# Patient Record
Sex: Female | Born: 1940
Health system: Southern US, Community
[De-identification: ages and names within clinical notes are randomized; demographics above are authoritative.]

## PROBLEM LIST (undated history)

## (undated) DIAGNOSIS — F419 Anxiety disorder, unspecified: Secondary | ICD-10-CM

## (undated) DIAGNOSIS — J45909 Unspecified asthma, uncomplicated: Secondary | ICD-10-CM

## (undated) DIAGNOSIS — R42 Dizziness and giddiness: Secondary | ICD-10-CM

## (undated) DIAGNOSIS — M1990 Unspecified osteoarthritis, unspecified site: Secondary | ICD-10-CM

## (undated) DIAGNOSIS — N281 Cyst of kidney, acquired: Secondary | ICD-10-CM

## (undated) DIAGNOSIS — Z9889 Other specified postprocedural states: Secondary | ICD-10-CM

## (undated) DIAGNOSIS — J189 Pneumonia, unspecified organism: Secondary | ICD-10-CM

## (undated) DIAGNOSIS — IMO0001 Reserved for inherently not codable concepts without codable children: Secondary | ICD-10-CM

## (undated) DIAGNOSIS — R112 Nausea with vomiting, unspecified: Secondary | ICD-10-CM

## (undated) DIAGNOSIS — B9681 Helicobacter pylori [H. pylori] as the cause of diseases classified elsewhere: Secondary | ICD-10-CM

## (undated) DIAGNOSIS — Z87442 Personal history of urinary calculi: Secondary | ICD-10-CM

## (undated) DIAGNOSIS — K219 Gastro-esophageal reflux disease without esophagitis: Secondary | ICD-10-CM

## (undated) DIAGNOSIS — R519 Headache, unspecified: Secondary | ICD-10-CM

## (undated) DIAGNOSIS — F32A Depression, unspecified: Secondary | ICD-10-CM

## (undated) DIAGNOSIS — K760 Fatty (change of) liver, not elsewhere classified: Secondary | ICD-10-CM

## (undated) DIAGNOSIS — L309 Dermatitis, unspecified: Secondary | ICD-10-CM

## (undated) DIAGNOSIS — K589 Irritable bowel syndrome without diarrhea: Secondary | ICD-10-CM

## (undated) DIAGNOSIS — D649 Anemia, unspecified: Secondary | ICD-10-CM

## (undated) DIAGNOSIS — M542 Cervicalgia: Secondary | ICD-10-CM

## (undated) DIAGNOSIS — R51 Headache: Secondary | ICD-10-CM

## (undated) DIAGNOSIS — Z531 Procedure and treatment not carried out because of patient's decision for reasons of belief and group pressure: Secondary | ICD-10-CM

## (undated) DIAGNOSIS — R7303 Prediabetes: Secondary | ICD-10-CM

## (undated) DIAGNOSIS — F329 Major depressive disorder, single episode, unspecified: Secondary | ICD-10-CM

## (undated) DIAGNOSIS — M797 Fibromyalgia: Secondary | ICD-10-CM

## (undated) DIAGNOSIS — E785 Hyperlipidemia, unspecified: Secondary | ICD-10-CM

## (undated) DIAGNOSIS — J385 Laryngeal spasm: Secondary | ICD-10-CM

## (undated) DIAGNOSIS — T8859XA Other complications of anesthesia, initial encounter: Secondary | ICD-10-CM

## (undated) DIAGNOSIS — G4733 Obstructive sleep apnea (adult) (pediatric): Secondary | ICD-10-CM

## (undated) DIAGNOSIS — K279 Peptic ulcer, site unspecified, unspecified as acute or chronic, without hemorrhage or perforation: Secondary | ICD-10-CM

## (undated) HISTORY — DX: Helicobacter pylori (H. pylori) as the cause of diseases classified elsewhere: B96.81

## (undated) HISTORY — DX: Reserved for inherently not codable concepts without codable children: IMO0001

## (undated) HISTORY — DX: Dermatitis, unspecified: L30.9

## (undated) HISTORY — DX: Unspecified asthma, uncomplicated: J45.909

## (undated) HISTORY — DX: Helicobacter pylori (H. pylori) as the cause of diseases classified elsewhere: K27.9

## (undated) HISTORY — DX: Gastro-esophageal reflux disease without esophagitis: K21.9

## (undated) HISTORY — PX: INNER EAR SURGERY: SHX679

## (undated) HISTORY — DX: Fibromyalgia: M79.7

## (undated) HISTORY — DX: Hyperlipidemia, unspecified: E78.5

## (undated) HISTORY — PX: TONSILLECTOMY: SUR1361

## (undated) HISTORY — DX: Anxiety disorder, unspecified: F41.9

## (undated) HISTORY — DX: Unspecified osteoarthritis, unspecified site: M19.90

## (undated) HISTORY — DX: Depression, unspecified: F32.A

## (undated) HISTORY — DX: Obstructive sleep apnea (adult) (pediatric): G47.33

## (undated) HISTORY — DX: Procedure and treatment not carried out because of patient's decision for reasons of belief and group pressure: Z53.1

## (undated) HISTORY — DX: Irritable bowel syndrome, unspecified: K58.9

## (undated) HISTORY — PX: KNEE SURGERY: SHX244

## (undated) HISTORY — DX: Major depressive disorder, single episode, unspecified: F32.9

## (undated) HISTORY — PX: APPENDECTOMY: SHX54

## (undated) HISTORY — PX: BILATERAL SALPINGOOPHORECTOMY: SHX1223

---

## 1968-02-17 HISTORY — PX: TUBAL LIGATION: SHX77

## 1968-02-17 HISTORY — PX: ABDOMINAL HYSTERECTOMY: SHX81

## 1968-02-17 HISTORY — PX: VAGINAL HYSTERECTOMY: SUR661

## 1982-02-16 HISTORY — PX: TYMPANOSTOMY TUBE PLACEMENT: SHX32

## 2000-02-17 HISTORY — PX: CARDIAC CATHETERIZATION: SHX172

## 2000-10-21 ENCOUNTER — Encounter: Payer: Self-pay | Admitting: Family Medicine

## 2000-10-21 ENCOUNTER — Ambulatory Visit (HOSPITAL_COMMUNITY): Admission: RE | Admit: 2000-10-21 | Discharge: 2000-10-21 | Payer: Self-pay | Admitting: Internal Medicine

## 2000-10-21 ENCOUNTER — Ambulatory Visit (HOSPITAL_COMMUNITY): Admission: RE | Admit: 2000-10-21 | Discharge: 2000-10-21 | Payer: Self-pay | Admitting: Family Medicine

## 2000-12-09 ENCOUNTER — Ambulatory Visit (HOSPITAL_COMMUNITY): Admission: RE | Admit: 2000-12-09 | Discharge: 2000-12-10 | Payer: Self-pay | Admitting: Cardiology

## 2001-08-02 ENCOUNTER — Ambulatory Visit (HOSPITAL_BASED_OUTPATIENT_CLINIC_OR_DEPARTMENT_OTHER): Admission: RE | Admit: 2001-08-02 | Discharge: 2001-08-02 | Payer: Self-pay | Admitting: Family Medicine

## 2002-01-03 ENCOUNTER — Ambulatory Visit (HOSPITAL_COMMUNITY): Admission: RE | Admit: 2002-01-03 | Discharge: 2002-01-03 | Payer: Self-pay | Admitting: Family Medicine

## 2002-01-03 ENCOUNTER — Encounter: Payer: Self-pay | Admitting: Family Medicine

## 2003-02-17 HISTORY — PX: FOOT SURGERY: SHX648

## 2006-11-04 ENCOUNTER — Ambulatory Visit (HOSPITAL_COMMUNITY): Admission: RE | Admit: 2006-11-04 | Discharge: 2006-11-04 | Payer: Self-pay | Admitting: Family Medicine

## 2006-11-27 ENCOUNTER — Emergency Department (HOSPITAL_COMMUNITY): Admission: EM | Admit: 2006-11-27 | Discharge: 2006-11-27 | Payer: Self-pay | Admitting: Emergency Medicine

## 2007-01-17 HISTORY — PX: COLONOSCOPY: SHX174

## 2007-01-17 HISTORY — PX: ESOPHAGOGASTRODUODENOSCOPY: SHX1529

## 2007-01-28 ENCOUNTER — Ambulatory Visit: Payer: Self-pay | Admitting: Gastroenterology

## 2007-02-14 ENCOUNTER — Encounter: Payer: Self-pay | Admitting: Gastroenterology

## 2007-02-14 ENCOUNTER — Ambulatory Visit: Payer: Self-pay | Admitting: Gastroenterology

## 2007-02-14 DIAGNOSIS — K2100 Gastro-esophageal reflux disease with esophagitis, without bleeding: Secondary | ICD-10-CM | POA: Insufficient documentation

## 2007-02-14 DIAGNOSIS — K2970 Gastritis, unspecified, without bleeding: Secondary | ICD-10-CM | POA: Insufficient documentation

## 2007-02-14 DIAGNOSIS — K2990 Gastroduodenitis, unspecified, without bleeding: Secondary | ICD-10-CM

## 2007-02-15 DIAGNOSIS — M19049 Primary osteoarthritis, unspecified hand: Secondary | ICD-10-CM | POA: Insufficient documentation

## 2007-02-15 DIAGNOSIS — K589 Irritable bowel syndrome without diarrhea: Secondary | ICD-10-CM | POA: Insufficient documentation

## 2007-02-15 DIAGNOSIS — G4730 Sleep apnea, unspecified: Secondary | ICD-10-CM | POA: Insufficient documentation

## 2007-02-15 DIAGNOSIS — K219 Gastro-esophageal reflux disease without esophagitis: Secondary | ICD-10-CM | POA: Insufficient documentation

## 2007-02-15 DIAGNOSIS — E7800 Pure hypercholesterolemia, unspecified: Secondary | ICD-10-CM | POA: Insufficient documentation

## 2007-02-15 DIAGNOSIS — M19041 Primary osteoarthritis, right hand: Secondary | ICD-10-CM | POA: Insufficient documentation

## 2007-02-15 DIAGNOSIS — M19042 Primary osteoarthritis, left hand: Secondary | ICD-10-CM

## 2007-03-18 ENCOUNTER — Ambulatory Visit: Payer: Self-pay | Admitting: Gastroenterology

## 2007-07-12 ENCOUNTER — Ambulatory Visit (HOSPITAL_COMMUNITY): Admission: RE | Admit: 2007-07-12 | Discharge: 2007-07-12 | Payer: Self-pay | Admitting: Family Medicine

## 2007-08-04 ENCOUNTER — Ambulatory Visit (HOSPITAL_COMMUNITY): Admission: RE | Admit: 2007-08-04 | Discharge: 2007-08-04 | Payer: Self-pay | Admitting: Family Medicine

## 2007-08-05 ENCOUNTER — Ambulatory Visit (HOSPITAL_COMMUNITY): Admission: RE | Admit: 2007-08-05 | Discharge: 2007-08-05 | Payer: Self-pay | Admitting: Family Medicine

## 2007-09-05 ENCOUNTER — Ambulatory Visit (HOSPITAL_COMMUNITY): Admission: RE | Admit: 2007-09-05 | Discharge: 2007-09-05 | Payer: Self-pay | Admitting: Family Medicine

## 2007-09-05 ENCOUNTER — Encounter: Payer: Self-pay | Admitting: Family Medicine

## 2007-09-05 ENCOUNTER — Ambulatory Visit: Payer: Self-pay | Admitting: Cardiology

## 2008-10-18 ENCOUNTER — Ambulatory Visit (HOSPITAL_COMMUNITY): Admission: RE | Admit: 2008-10-18 | Discharge: 2008-10-18 | Payer: Self-pay | Admitting: Family Medicine

## 2008-10-26 ENCOUNTER — Encounter (INDEPENDENT_AMBULATORY_CARE_PROVIDER_SITE_OTHER): Payer: Self-pay | Admitting: *Deleted

## 2008-10-26 LAB — CONVERTED CEMR LAB
Cholesterol: 267 mg/dL
Cholesterol: 267 mg/dL
Glucose, Bld: 102 mg/dL
HDL: 51 mg/dL
HDL: 51 mg/dL
LDL Cholesterol: 190 mg/dL
LDL Cholesterol: 190 mg/dL
TSH: 3.082 microintl units/mL
TSH: 3.082 microintl units/mL
Triglycerides: 130 mg/dL
Triglycerides: 130 mg/dL

## 2008-11-21 ENCOUNTER — Emergency Department (HOSPITAL_COMMUNITY): Admission: EM | Admit: 2008-11-21 | Discharge: 2008-11-21 | Payer: Self-pay | Admitting: Emergency Medicine

## 2009-02-01 ENCOUNTER — Encounter (INDEPENDENT_AMBULATORY_CARE_PROVIDER_SITE_OTHER): Payer: Self-pay | Admitting: *Deleted

## 2009-02-01 ENCOUNTER — Emergency Department (HOSPITAL_COMMUNITY): Admission: EM | Admit: 2009-02-01 | Discharge: 2009-02-01 | Payer: Self-pay | Admitting: Emergency Medicine

## 2009-02-01 LAB — CONVERTED CEMR LAB
BUN: 14 mg/dL
CO2: 28 meq/L
Calcium: 9.2 mg/dL
Chloride: 103 meq/L
Creatinine, Ser: 0.7 mg/dL
GFR calc non Af Amer: >60 mL/min
Glomerular Filtration Rate, Af Am: >60 mL/min/{1.73_m2}
Glucose, Bld: 101 mg/dL
HCT: 39.5 %
Hemoglobin: 13.1 g/dL
MCV: 94.9 fL
Platelets: 243 10*3/uL
Potassium: 3.5 meq/L
Sodium: 137 meq/L
Total CK: <1 units/L
Troponin I: <0.05 ng/mL
WBC: 5.8 10*3/uL

## 2009-02-11 ENCOUNTER — Encounter: Payer: Self-pay | Admitting: *Deleted

## 2009-02-11 ENCOUNTER — Ambulatory Visit: Payer: Self-pay | Admitting: Cardiology

## 2009-02-11 DIAGNOSIS — R079 Chest pain, unspecified: Secondary | ICD-10-CM | POA: Insufficient documentation

## 2009-02-16 HISTORY — PX: SHOULDER SURGERY: SHX246

## 2009-03-04 ENCOUNTER — Encounter (INDEPENDENT_AMBULATORY_CARE_PROVIDER_SITE_OTHER): Payer: Self-pay | Admitting: *Deleted

## 2009-03-06 ENCOUNTER — Ambulatory Visit: Payer: Self-pay | Admitting: Cardiology

## 2009-03-06 ENCOUNTER — Ambulatory Visit (HOSPITAL_COMMUNITY): Admission: RE | Admit: 2009-03-06 | Discharge: 2009-03-06 | Payer: Self-pay | Admitting: Cardiology

## 2009-03-08 ENCOUNTER — Ambulatory Visit (HOSPITAL_COMMUNITY)
Admission: RE | Admit: 2009-03-08 | Discharge: 2009-03-08 | Payer: Self-pay | Source: Home / Self Care | Admitting: Family Medicine

## 2009-03-19 ENCOUNTER — Ambulatory Visit (HOSPITAL_COMMUNITY): Admission: RE | Admit: 2009-03-19 | Discharge: 2009-03-19 | Payer: Self-pay | Admitting: Family Medicine

## 2009-03-21 ENCOUNTER — Encounter: Payer: Self-pay | Admitting: Cardiology

## 2009-03-29 ENCOUNTER — Ambulatory Visit: Payer: Self-pay | Admitting: Cardiology

## 2009-04-24 ENCOUNTER — Encounter: Admission: RE | Admit: 2009-04-24 | Discharge: 2009-04-24 | Payer: Self-pay | Admitting: Orthopaedic Surgery

## 2009-12-03 ENCOUNTER — Encounter: Admission: RE | Admit: 2009-12-03 | Discharge: 2009-12-03 | Payer: Self-pay | Admitting: Orthopaedic Surgery

## 2009-12-13 ENCOUNTER — Encounter: Admission: RE | Admit: 2009-12-13 | Discharge: 2009-12-13 | Payer: Self-pay | Admitting: Neurosurgery

## 2009-12-20 ENCOUNTER — Ambulatory Visit: Admission: RE | Admit: 2009-12-20 | Discharge: 2009-12-20 | Payer: Self-pay | Admitting: Family Medicine

## 2010-03-09 ENCOUNTER — Encounter: Payer: Self-pay | Admitting: Family Medicine

## 2010-03-09 ENCOUNTER — Encounter: Payer: Self-pay | Admitting: Neurosurgery

## 2010-03-10 ENCOUNTER — Encounter: Payer: Self-pay | Admitting: Orthopaedic Surgery

## 2010-03-10 ENCOUNTER — Encounter: Payer: Self-pay | Admitting: Family Medicine

## 2010-03-13 ENCOUNTER — Other Ambulatory Visit: Payer: Self-pay | Admitting: Obstetrics and Gynecology

## 2010-03-20 NOTE — Assessment & Plan Note (Signed)
Summary: per Dr.Scott Luking for chest pain and pressure/tg  Medications Added DIAZEPAM 5 MG TABS (DIAZEPAM) take one tablet as needed PRILOSEC 20 MG CPDR (OMEPRAZOLE) two times a day as needed      Allergies Added: ! CODEINE ! CIPRO  Visit Type:  Initial Consult Primary Provider:  Dr. Lilyan Punt   History of Present Illness: 70 year old woman with past medical history outlined below, referred following a recent episode of chest and epigastric discomfort. She has a remote history of chest pain back in 2002, evaluated with both noninvasive stress testing and a subsequent cardiac catheterization, with findings of normal coronary arteries ultimately ([facility]).  Ruth [name] indicates that she awoke from sleep at around 6 AM last Thursday with a feeling of "fullness and swelling" in her epigastric area and chest. She subsequently felt nauseated and somewhat agitated, but decided to go about her usual routine. She was sweeping her porch later when her symptoms intensified, mainly nausea and diaphoresis at that point, requiring her to lie down. The symptoms waxed and waned for the rest of the day into the following morning, and she was eventually seen in the emergency department. Symptoms resolved spontaneously without any specific intervention by her report. Cardiac markers at that time were normal, as was an electrocardiogram. She is scheduled for a gallbladder ultrasound tomorrow.  She indicates that there have been no subsequent symptoms. She denies any typical exertional chest pain or breathlessness beyond NYHA class II. No palpitations or syncope. No reproduction of symptoms following meals. Stable reflux symptoms, with no similarity.  She has had no followup ischemic testing over the last 8 years.  Preventive Screening-Counseling & Management  Alcohol-Tobacco     Smoking Status: never  Current Medications (verified): 1)  Diazepam 5 Mg Tabs (Diazepam) .... Take One Tablet As  Needed 2)  Prilosec 20 Mg Cpdr (Omeprazole) .... Two Times A Day As Needed  Allergies (verified): 1)  ! Codeine 2)  ! Cipro  Past History:  Family History: Last updated: 02-19-09 Father: deceased MI and lung CA Mother: living  CVA Siblings: sister living lung CA                sister living HTN                sister deceased congental heart defect  Social History: Last updated: 02-19-09 Retired   Married  Tobacco Use - No Alcohol Use - yes (occasional)  Past Medical History: Anxiety G E R D (EGD/colon 1/09) Hyperlipidemia Obstructive sleep apnea Herbal bowel syndrome Arthritis Depression Normal cardiac catheterization, 2002 ([facility]) Fibromyalgia  Past Surgical History: Hysterectomy Appendectomy Knee Surgeries Bilateral Oophorectomy Tubal Ligation Left ear surgery  Family History: Father: deceased MI and lung CA Mother: living  CVA Siblings: sister living lung CA                sister living HTN                sister deceased congental heart defect  Social History: Retired   Married  Tobacco Use - No Alcohol Use - yes (occasional) Smoking Status:  never  Review of Systems  The patient denies anorexia, fever, weight loss, vision loss, hoarseness, syncope, peripheral edema, prolonged cough, headaches, hemoptysis, abdominal pain, melena, and hematochezia.         Otherwise reviewed and negative.  Vital Signs:  Patient profile:   70 year old female Height:      65 inches Weight:  211 pounds BMI:     35.24 O2 Sat:      98 % on Room air Pulse rate:   67 / minute BP sitting:   123 / 75  (left arm)  Vitals Entered By: Teressa Lower RN (February 11, 2009 1:14 PM)  O2 Flow:  Room air  Echocardiogram  Procedure date:  09/05/2007  Findings:      SUMMARY   -  Overall left ventricular systolic function was normal. There were         no left ventricular regional wall motion abnormalities.   -  The left atrium was mildly dilated.   Physical  Exam  Additional Exam:  Overweight woman in no acute distress. HEENT: Conjunctiva and lids normal, oropharynx clear. Neck: Supple, no carotid bruits, no thyromegaly. Lungs: Clear to auscultation, nonlabored. Cardiac: Regular rate and rhythm, no S3, no pathologic systolic murmur. Abdomen: Soft, nontender, bowel sounds present, no bruits. Activities: No pitting edema, distal pulses 2+. Skin: Warm and dry. Musculoskeletal: No kyphosis. Neuropsychiatric: Alert and oriented x3, affect appropriate.   CXR  Procedure date:  02/01/2009  Findings:       Findings: There is subsegmental atelectasis or scarring   peripherally at the left lung base.  The lungs appear otherwise   clear.  Cardiac and mediastinal contours appear unremarkable.    No additional significant findings are identified.    IMPRESSION:    1.  Linear subsegmental atelectasis or scarring peripherally at the   left lung base.  Otherwise negative.  Cardiac Cath  Procedure date:  12/09/2000  Findings:      HEMODYNAMIC DATA: 1. Central aortic pressure was 116/59. 2. Left ventricular pressure 116/18.  ANGIOGRAPHIC DATA:  Left main coronary artery was angiographically normal and bifurcated into an LAD and left circumflex system.  The LAD gave rise to a proximal bifurcating diagonal vessel.  The LAD and its branches were angiographically normal.  The circumflex coronary artery gave rise to two circumflex marginal vessels which were angiographically normal.  The right coronary artery was angiographically normal and gave rise to a PDA and ended in a posterolateral vessel.  BIPLANE CINE LEFT VENTRICULOGRAPHY revealed normal LV function without focal segmental wall motion abnormalities.  DISTAL AORTOGRAPHY did not demonstrate any aortoiliac disease.  At the completion of the procedure, Perclose closure device was attempted; however, this resulted in a suboptimal seal.  Consequently, manual pressure was applied  for optimal hemostasis.  EKG  Procedure date:  02/11/2009  Findings:      Normal sinus rhythm at 62 beats per minute.  Impression & Recommendations:  Problem # 1:  CHEST PAIN (ICD-786.50)  Atypical features, without recurrence. Associated symptoms included nausea and diaphoresis, perhaps with a transient exertional component. She does have reflux on a regular basis, although states that these symptoms are different. She also reports a prior history of "esophageal spasm" also different in character. Cardiac risk factors include hyperlipidemia being managed without medications, obstructive sleep apnea, and family history of cardiovascular disease. She has not undergone any interval ischemic testing since 2002. We discussed the matter and reviewed options for further diagnosis. She will be referred for an exercise echocardiogram (dobutamine study if unable to achieve adequate heart rate). Office followup will be arranged with the next few weeks.  Orders: Treadmill (Treadmill)  Problem # 2:  HYPERCHOLESTEROLEMIA (ICD-272.0)  Presently not on any specific medical therapy. This has been discussed with her in the past based on records.  Problem # 3:  SLEEP  APNEA (ICD-780.57)  Followed by Dr. Gerda Diss.  Patient Instructions: 1)  Your physician recommends that you schedule a follow-up appointment in: 2 to 3 weeks 2)  Your physician recommends that you continue on your current medications as directed. Please refer to the Current Medication list given to you today. 3)  Your physician has requested that you have an exercise echocardiogram.  For further information please visit [email or web address]  Please also follow instruction sheet, as given.    CXR  Procedure date:  02/01/2009  Findings:       Findings: There is subsegmental atelectasis or scarring   peripherally at the left lung base.  The lungs appear otherwise   clear.  Cardiac and mediastinal contours appear unremarkable.    No  additional significant findings are identified.    IMPRESSION:    1.  Linear subsegmental atelectasis or scarring peripherally at the   left lung base.  Otherwise negative.  Cardiac Cath  Procedure date:  12/09/2000  Findings:      HEMODYNAMIC DATA: 1. Central aortic pressure was 116/59. 2. Left ventricular pressure 116/18.  ANGIOGRAPHIC DATA:  Left main coronary artery was angiographically normal and bifurcated into an LAD and left circumflex system.  The LAD gave rise to a proximal bifurcating diagonal vessel.  The LAD and its branches were angiographically normal.  The circumflex coronary artery gave rise to two circumflex marginal vessels which were angiographically normal.  The right coronary artery was angiographically normal and gave rise to a PDA and ended in a posterolateral vessel.  BIPLANE CINE LEFT VENTRICULOGRAPHY revealed normal LV function without focal segmental wall motion abnormalities.  DISTAL AORTOGRAPHY did not demonstrate any aortoiliac disease.  At the completion of the procedure, Perclose closure device was attempted; however, this resulted in a suboptimal seal.  Consequently, manual pressure was applied for optimal hemostasis.  EKG  Procedure date:  02/11/2009  Findings:      Normal sinus rhythm at 62 beats per minute.  Appended Document: Orders Update- Stress Echo    Clinical Lists Changes  Orders: Added new Referral order of Stress Echo (Stress Echo) - Signed

## 2010-03-20 NOTE — Assessment & Plan Note (Signed)
Summary: ROV RESCEDULED PATIENT CAR PROBLEMS/SN      Allergies Added:   Visit Type:  Follow-up Primary Provider:  Dr. Lilyan Punt   History of Present Illness: 70 year old woman presents for a followup visit. She was referred for a followup ischemic evaluation due to chest pain. Exercise echocardiography was normal as detailed below. We reviewed this study today. She states that her subsequent gallbladder evaluation was reassuring. She denies any recurrent chest discomfort, and generally feels well.   Current Medications (verified): 1)  Diazepam 5 Mg Tabs (Diazepam) .... Take One Tablet As Needed 2)  Prilosec 20 Mg Cpdr (Omeprazole) .... Two Times A Day As Needed  Allergies (verified): 1)  ! Codeine 2)  ! Cipro  Past History:  Past Medical History: Last updated: 02/11/2009 Anxiety G E R D (EGD/colon 1/09) Hyperlipidemia Obstructive sleep apnea Herbal bowel syndrome Arthritis Depression Normal cardiac catheterization, 2002 ([facility]) Fibromyalgia  Social History: Last updated: 02/11/2009 Retired   Married  Tobacco Use - No Alcohol Use - yes (occasional)   Review of Systems  The patient denies anorexia, fever, weight loss, chest pain, syncope, dyspnea on exertion, peripheral edema, melena, and hematochezia.         Otherwise reviewed and negative.  Vital Signs:  Patient profile:   70 year old female Weight:      215 pounds Temp:     97.8 degrees F Pulse rate:   79 / minute BP sitting:   124 / 70  (right arm)  Vitals Entered By: Dreama Saa, CNA (March 29, 2009 10:57 AM)  Physical Exam  Additional Exam:  Overweight woman in no acute distress. HEENT: Conjunctiva and lids normal, oropharynx clear. Neck: Supple, no carotid bruits, no thyromegaly. Lungs: Clear to auscultation, nonlabored. Cardiac: Regular rate and rhythm, no S3, no pathologic systolic murmur. Musculoskeletal: No kyphosis. Neuropsychiatric: Alert and oriented x3, affect  appropriate.   Stress Echocardiogram  Procedure date:  03/06/2009  Findings:      CONCLUSION:   1. Negative exercise treadmill test for ischemia by standard criteria       at a maximum workload of 7 METS.  There was a hypertensive response       with exercise, no chest pain, and no significant arrhythmias.   2. No inducible wall motion abnormalities by echocardiography to       indicate ischemia.   Impression & Recommendations:  Problem # 1:  CHEST PAIN (ICD-786.50)  No significant recurrence, and follow-up reassuring stress echocardiogram demonstrating no diagnostic ST segment changes or inducible wall motion abnormalities. I reassured Ms. Freas. No further cardiac testing is anticipated at this particular time. Suggest basic risk factor modification strategies. She will follow up with her primary care provider, and we can see her back as needed.  Patient Instructions: 1)  Your physician recommends that you schedule a follow-up appointment in: as needed   2)  Your physician recommends that you continue on your current medications as directed. Please refer to the Current Medication list given to you today.

## 2010-03-20 NOTE — Procedures (Signed)
Summary: Gastroenterology colon  Gastroenterology colon   Imported By: Lowry Ram CMA 02/25/2007 14:15:31  _____________________________________________________________________  External Attachment:    Type:   Image     Comment:   External Document

## 2010-03-20 NOTE — Letter (Signed)
Summary: [place] Results Engineer, agricultural at [facility]  [address], [place] [address]   Phone: [phone]  Fax: [phone]      March 21, 2009 MRN: [id number]   [facility] [address] [place], [place]  [address]   Dear Ms. Jean Rosenthal,  Your test ordered by Selena Batten has been reviewed by your physician (or physician assistant) and was found to be normal or stable. Your physician (or physician assistant) felt no changes were needed at this time.  __X__ Echocardiogram  ____ Cardiac Stress Test  ____ Lab Work  ____ Peripheral vascular study of arms, legs or neck  ____ CT scan or X-ray  ____ Lung or Breathing test  ____ Other: Please continue on current medical treatment.   Thank you.   Nona Dell, MD, F.A.C.C

## 2010-03-20 NOTE — Procedures (Signed)
Summary: Gastroenterology  Gastroenterology   Imported By: Lowry Ram CMA 02/25/2007 14:21:23  _____________________________________________________________________  External Attachment:    Type:   Image     Comment:   External Document

## 2010-03-20 NOTE — Miscellaneous (Signed)
Summary: labs lipids,tsh,10/26/08  Clinical Lists Changes  Observations: Added new observation of BG RANDOM: 102 mg/dL (16/11/9602 5:40) Added new observation of LDL: 190 mg/dL (98/12/9145 8:29) Added new observation of HDL: 51 mg/dL (56/21/3086 5:78) Added new observation of TRIGLYC TOT: 130 mg/dL (46/96/2952 8:41) Added new observation of CHOLESTEROL: 267 mg/dL (32/44/0102 7:25) Added new observation of TSH: 3.082 microintl units/mL (10/26/2008 8:22)

## 2010-05-19 LAB — DIFFERENTIAL
Basophils Absolute: 0 10*3/uL (ref 0.0–0.1)
Basophils Relative: 1 % (ref 0–1)
Eosinophils Absolute: 0.1 10*3/uL (ref 0.0–0.7)
Eosinophils Relative: 2 % (ref 0–5)
Lymphocytes Relative: 30 % (ref 12–46)
Lymphs Abs: 1.8 10*3/uL (ref 0.7–4.0)
Monocytes Absolute: 0.4 10*3/uL (ref 0.1–1.0)
Monocytes Relative: 7 % (ref 3–12)
Neutro Abs: 3.5 10*3/uL (ref 1.7–7.7)
Neutrophils Relative %: 60 % (ref 43–77)

## 2010-05-19 LAB — BASIC METABOLIC PANEL
BUN: 14 mg/dL (ref 6–23)
CO2: 28 mEq/L (ref 19–32)
Calcium: 9.2 mg/dL (ref 8.4–10.5)
Chloride: 103 mEq/L (ref 96–112)
Creatinine, Ser: 0.7 mg/dL (ref 0.4–1.2)
GFR calc Af Amer: >60 mL/min (ref >60)
GFR calc non Af Amer: >60 mL/min (ref >60)
Glucose, Bld: 101 mg/dL — ABNORMAL HIGH (ref 70–99)
Potassium: 3.5 mEq/L (ref 3.5–5.1)
Sodium: 137 mEq/L (ref 135–145)

## 2010-05-19 LAB — CBC
HCT: 39.5 % (ref 36.0–46.0)
Hemoglobin: 13.1 g/dL (ref 12.0–15.0)
MCHC: 33 g/dL (ref 30.0–36.0)
MCV: 94.9 fL (ref 78.0–100.0)
Platelets: 243 10*3/uL (ref 150–400)
RBC: 4.16 MIL/uL (ref 3.87–5.11)
RDW: 13.9 % (ref 11.5–15.5)
WBC: 5.8 10*3/uL (ref 4.0–10.5)

## 2010-05-19 LAB — POCT CARDIAC MARKERS
CKMB, poc: <1 ng/mL — ABNORMAL LOW (ref 1.0–8.0)
CKMB, poc: <1 ng/mL — ABNORMAL LOW (ref 1.0–8.0)
Myoglobin, poc: 72 ng/mL (ref 12–200)
Myoglobin, poc: 82.3 ng/mL (ref 12–200)
Troponin i, poc: <0.05 ng/mL (ref 0.00–0.09)
Troponin i, poc: <0.05 ng/mL (ref 0.00–0.09)

## 2010-07-01 NOTE — Assessment & Plan Note (Signed)
[facility]                         GASTROENTEROLOGY OFFICE NOTE   NAME:JACKSONCaci, Orren                      MRN:          [id number]  DATE:03/18/2007                            DOB:          12-Jan-1941    Blanchie's endoscopy and colonoscopy were both unremarkable, except for 3  cm unremarkable, except for a 3 cm hiatal hernia.  Esophageal biopsies  were unremarkable, without evidence of Barrett's mucosa.   Since being on Prevacid 30 mg a day and a high-fiber diet with daily  fiber supplements, she is asymptomatic.  She has had underlying chronic  GERD for many years and we will continue her on maintenance PPI therapy.  We will give her information concerning acid reflux management.  I will  see her on a p.r.n. basis as needed in the future.     [name], MD, Caleen Essex, FAGA  Electronically Signed    DRP/MedQ  DD: 03/18/2007  DT: 03/18/2007  Job #: [id number]   cc:   Lorin Picket A. Gerda Diss, MD

## 2010-07-01 NOTE — Assessment & Plan Note (Signed)
[facility]                         GASTROENTEROLOGY OFFICE NOTE   Ruth [name], Ruth [name]                      MRN:          [id number]  DATE:01/28/2007                            DOB:          1941-01-31    Ruth [name] is a 70 year old white female, retired ophthalmologic  technician who is referred for evaluation of chronic acid reflux and  chronic recurrent constipation and left lower quadrant pain.   Ruth [name] has had years of irritable bowel syndrome type complaints  and also chronic acid reflux. She relates that some 10 to 12 years ago  she had endoscopy and colonoscopy, the results of which are uncertain.  She currently has fairly regular bowel habits, but will occasionally get  constipated and have rather severe crampy pain in her left lower  quadrant without fever, chills, or rectal bleeding. She has chronic acid  reflux with burning substernal chest pain, and she has esophageal  spasms, but she has not had any true dysphagia or actual esophageal  manifestation of her GERD. Her appetite is good and her weight is  stable. She gives no history of gallbladder disease or hepato-biliary  problems. She denies any specific food intolerances, her appetite is  good, and her weight has been stable. She denies abuse of alcohol,  cigarettes, or NSAIDs.   PAST MEDICAL HISTORY:  Somewhat complex and revolves are  hypercholesterolemia, degenerative arthritis, chronic anxiety and  depression, moderate sleep apnea, previous hysterectomy, and bilateral  oophorectomy, tubal ligation, appendectomy, and multiple knee surgeries.   MEDICATIONS:  None.   ALLERGIES:  Allegedly, to CODEINE and CIPRO.   FAMILY HISTORY:  Remarkable for colon polyps in both her mother and her  sister. Several family members also have atherosclerotic cardiovascular  and diabetes. Her father suffered from alcoholism. Alzheimer's on her  mother's side.   SOCIAL HISTORY:  She is  married and lives with her husband. 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 She does not smoke. She uses ethanol  socially.   REVIEW OF SYSTEMS:  Remarkable for many minor complaints including a  fungal-type skin rash in her groin, insomnia, sleep apnea, chronic back  pain, chronic fatigue, some mild urinary incontinence, and various  arthralgias and myalgias. She denies any current cardiovascular,  pulmonary, or neurological problems. She says that her  anxiety/depression is under good control at this time. She denies any  symptoms of psychosis.   PHYSICAL EXAMINATION:  GENERAL:  She is an attractive, healthy appearing  white female in no distress appearing her stated age.  VITAL SIGNS:  Height 5 foot 5 inches, weight 220 pounds, blood pressure  126/68, pulse 70 and regular. I could not appreciate stigmata, chronic  liver disease, or thyromegaly.  CHEST:  Entirely clear and she was in a regular rhythm with no murmurs,  rubs, or gallops.  ABDOMEN:  I could not appreciate hepatosplenomegaly, abdominal masses,  or significant tenderness.  UPPER EXTREMITIES:  Unremarkable.  NEUROLOGIC:  Mental status was clear.  RECTAL:  Deferred.   ASSESSMENT:  1. Chronic GERD - rule off Barrett's mucosa.  2. Chronic constipation predominant  irritable bowel syndrome with      probable symptomatic diverticulosis and perhaps, episodes of      subacute diverticulitis.  3. Status post total abdominal hysterectomy, bilateral oophorectomy      with possible pelvic adhesions.  4. History of chronic anxiety and depression.  5. History of hypercholesterolemia without other cardiovascular      symptoms.  6. History of multiple orthopedic procedures.   RECOMMENDATIONS:  1. Reflux regimen reviewed with patient. Started Prevacid 30 mg, 30      minutes before the first meal of the day.  2. IBS information, started a high fiber diet with daily Benefiber.  3. Outpatient endoscopy and colonoscopy at her  convenience.  4. Continue follow up with Dr. Gerda Diss as previously planned.   ADDENDUM   LABORATORY DATA:  Lab data from September 2008 showed a normal CBC with  a hemoglobin of 12.7, hematocrit 39.8, and a normal metabolic profile.   ADDENDUM #2   In reviewing Dr. Fletcher Anon note, the patient has been intolerant of  multiple antidepressants because of various gastrointestinal side  effects.     [name], MD, Caleen Essex, FAGA  Electronically Signed    DRP/MedQ  DD: 01/28/2007  DT: 01/29/2007  Job #: [id number]   cc:   Lorin Picket A. Gerda Diss, MD

## 2010-07-04 NOTE — Cardiovascular Report (Signed)
[place]  Patient:    Ruth [name], Ruth [name] Visit Number: [id number] MRN: [id number]          Service Type: CAT Location: [id number] Attending Physician:  Pamella Pert Dictated by:   Lennette Bihari, M.D.,FACC Proc. Date: 12/09/00 Admit Date:  12/09/2000 Discharge Date: 12/10/2000   CC:         Delrae Rend, M.D.  Dr. Renette Butters, [facility]  [facility]   Cardiac Catheterization  PROCEDURE:  Cardiac catheterization.  CARDIOLOGIST:  Lennette Bihari, M.D.  INDICATIONS:  Ms. Ruth [name] is a pleasant 70 year old white female with a history of hyperlipidemia who recently has experienced several episodes of chest tightness with pain radiation.  A Persantine Cardiolite study suggested a small region of mild inferior wall ischemia within distribution of the RCA. However, the patient was unable to exercise significantly and reached a workload of only 7 METs.  She is now referred for definitive diagnostic cardiac catheterization.  HEMODYNAMIC DATA: 1. Central aortic pressure was 116/59. 2. Left ventricular pressure 116/18.  ANGIOGRAPHIC DATA:  Left main coronary artery was angiographically normal and bifurcated into an LAD and left circumflex system.  The LAD gave rise to a proximal bifurcating diagonal vessel.  The LAD and its branches were angiographically normal.  The circumflex coronary artery gave rise to two circumflex marginal vessels which were angiographically normal.  The right coronary artery was angiographically normal and gave rise to a PDA and ended in a posterolateral vessel.  BIPLANE CINE LEFT VENTRICULOGRAPHY revealed normal LV function without focal segmental wall motion abnormalities.  DISTAL AORTOGRAPHY did not demonstrate any aortoiliac disease.  At the completion of the procedure, Perclose closure device was attempted; however, this resulted in a suboptimal seal.  Consequently, manual  pressure was applied for optimal hemostasis. Dictated by:   Lennette Bihari, M.D.,FACC Attending Physician:  Pamella Pert DD:  12/09/00 TD:  12/11/00 Job: [id number]

## 2010-07-04 NOTE — Discharge Summary (Signed)
[facility]  Patient:    Ruth [name], Ruth [name] Visit Number: [id number] MRN: [id number]          Service Type: CAT Location: [id number] Attending Physician:  Pamella Pert Dictated by:   Marya Fossa, P.A. Admit Date:  12/09/2000 Discharge Date: 12/10/2000   CC:         Dr. Kathyrn Drown, [place]   Discharge Summary  DATE OF BIRTH:  [birth year]  [facility] MEDICAL RECORD NUMBER:  [id number]  ADMISSION DIAGNOSES: 1. Abnormal Cardiolite stress test. 2. Chest pain suggestive of angina pectoris. 3. Obesity. 4. Sedentary life style. 5. Depression. 6. Hyperlipidemia - not on therapy per patient choice. 7. Family history of coronary artery disease (premature).  DISCHARGE DIAGNOSES: 1. Abnormal Cardiolite - false positive.  Status post cardiac catheterization    revealing entirely normal coronary arteries and normal left ventricular    function. 2. Chest pain - not anginal.  Likely gastrointestinal in etiology versus    stress induced.  Will start proton pump inhibitor prophylactically. 3. Obesity. 4. Sedentary life style. 5. Depression. 6. Hyperlipidemia - not on therapy per patient choice. 7. Family history of coronary artery disease (premature).  HISTORY OF PRESENT ILLNESS:  Ruth [name] is a 70 year old white female with a history of depression and hyperlipidemia.  She was doing well until approximately one month ago.  She was running to her car and developed sharp squeezing pressure in the middle of her chest with radiation to the left arm. This lasted five to ten minutes and spontaneously resolved.  She had a similar episode two to three weeks while walking around the house.  A friend gave her a sublingual nitroglycerin which gave her some relief.  She had a recurrent episode two weeks ago during normal activity which lasted three to four minutes and described as pressure/heaviness in the chest with  radiation to the left arm.  No associated diaphoresis.  She does have chronic shortness of breath.  She lives a sedentary life style hence any type of activity induces her to have shortness of breath.  She denies palpitations or syncope.  There is a family history of premature coronary artery disease.  The patient underwent nuclear stress testing on October 29, 2000 which revealed small to medium size inferior wall ischemia with an EF of 60%.  Based on these findings the patient is going to proceed with cardiac catheterization as an outpatient.  She is started on Norvasc for angina pectoris and sublingual nitroglycerin.  We have advised her to take an aspirin a day.  We will also check fasting lipid profile.  PROCEDURES:  Cardiac catheterization on December 09, 2000 by Dr. Nicki Guadalajara.  COMPLICATIONS:  None.  CONSULTATIONS:  None.  HOSPITAL COURSE:  [name] was admitted to [facility] for elective cardiac catheterization to define coronary anatomy.  Preprocedure laboratory studies revealed a hemoglobin of 12.6, white blood cells 5.0, platelets 267, INR 0.9, potassium 4.6, BUN 16, creatinine 0.8.  Total cholesterol was 248, triglyceride 165, HDL 48 and LDL 167.  LFTs were within normal limits.  Dr. Tresa Endo performed cardiac catheterization on December 09, 2000 which revealed essentially normal coronary arteries and normal EF with normal distal aortography.  Attempted but there was not a good seal, therefore manual pressure was applied.  The patient tolerated the procedure well.  Because the patient lives a long distance away and the catheterization was done late in the evening,  it was felt best to keep her overnight for observation.  On December 10, 2000 the patient remained stable with no chest pain. Hemodynamically stable.  Groin stable.  The patient will be discharged to home without outpatient follow up.  DISCHARGE MEDICATIONS: 1. Paxil one q.d. 2.  Valium 2 mg t.i.d. as directed. 3. Prevacid 30 mg q.d. 4. Aspirin 81 mg q.d.  The patient is to stop Norvasc and nitroglycerin.  ACTIVITY:  No strenuous activity, lifting more than five pounds or driving for two days.  May shower.  DIET:  Low fat, low cholesterol, low salt diet.  She is asked to call the office for problems or questions.  FOLLOW-UP: She will follow up with Dr. Renette Butters in [place] on December 31, 2000 at 10:40.  She should call Dr. Orson Slick for a follow up appointment as well.  The patient does have an elevated LDL at 167.  We recommend diet modification. ictated by:   Marya Fossa, P.A. Attending Physician:  Pamella Pert DD:  12/10/00 TD:  12/13/00 Job: [id number]

## 2010-07-04 NOTE — Procedures (Signed)
NAME:  Ruth [name], Ruth [name]               ACCOUNT NO.:  [id number]   MEDICAL RECORD NO.:  [id number]          PATIENT TYPE:  OUT   LOCATION:  RESP                          FACILITY:  [facility]   PHYSICIAN:  Edward L. Juanetta Gosling, M.D.DATE OF BIRTH:  [birth year]   DATE OF PROCEDURE:  DATE OF DISCHARGE:  08/05/2007                            PULMONARY FUNCTION TEST   1. Spirometry shows no ventilatory defect and no evidence of airflow      obstruction.  2. Lung volumes are normal.  3. DLCO is normal.  4. Arterial blood gases are normal.      Edward L. Juanetta Gosling, M.D.  Electronically Signed     [facility]/MEDQ  D:  08/08/2007  T:  08/08/2007  Job:  [id number]   cc:   Corrie Mckusick, M.D.  Fax: [phone]

## 2010-11-13 LAB — BLOOD GAS, ARTERIAL
Acid-Base Excess: 1.5
Bicarbonate: 25.6 — ABNORMAL HIGH
FIO2: 21
O2 Saturation: 95.7
Patient temperature: 37
TCO2: 22.9
pCO2 arterial: 40.4
pH, Arterial: 7.418 — ABNORMAL HIGH
pO2, Arterial: 78.5 — ABNORMAL LOW

## 2010-11-20 ENCOUNTER — Ambulatory Visit (INDEPENDENT_AMBULATORY_CARE_PROVIDER_SITE_OTHER): Payer: Medicare Other | Admitting: Otolaryngology

## 2010-11-20 DIAGNOSIS — H6980 Other specified disorders of Eustachian tube, unspecified ear: Secondary | ICD-10-CM

## 2010-11-20 DIAGNOSIS — H6120 Impacted cerumen, unspecified ear: Secondary | ICD-10-CM

## 2010-11-20 DIAGNOSIS — H7200 Central perforation of tympanic membrane, unspecified ear: Secondary | ICD-10-CM

## 2011-01-23 LAB — CBC WITH DIFFERENTIAL/PLATELET
HCT: 41 %
Hemoglobin: 13.4 g/dL (ref 12.0–16.0)
IgA: 1.4
IgG (Immunoglobin G), Serum: 2.1
Tissue Transglutaminase Ab, IgA: 1.1
WBC: 5.3
platelet count: 283

## 2011-02-02 ENCOUNTER — Encounter: Payer: Self-pay | Admitting: Cardiology

## 2011-02-05 ENCOUNTER — Ambulatory Visit (INDEPENDENT_AMBULATORY_CARE_PROVIDER_SITE_OTHER): Payer: Medicare Other | Admitting: Urgent Care

## 2011-02-05 ENCOUNTER — Encounter: Payer: Self-pay | Admitting: Urgent Care

## 2011-02-05 DIAGNOSIS — R1319 Other dysphagia: Secondary | ICD-10-CM | POA: Insufficient documentation

## 2011-02-05 DIAGNOSIS — R1310 Dysphagia, unspecified: Secondary | ICD-10-CM

## 2011-02-05 DIAGNOSIS — K219 Gastro-esophageal reflux disease without esophagitis: Secondary | ICD-10-CM

## 2011-02-05 NOTE — Assessment & Plan Note (Addendum)
Ruth [name] is a pleasant 70 y.o. female with chronic GERD. She would benefit from daily PPI, however she does have anxiety about taking it daily medicines. EGD for further evaluation to look for gastritis, refractory H. pylori, erosive esophagitis, esophageal web, ring, or stricture, or peptic ulcer disease.  I have discussed risks & benefits which include, but are not limited to, bleeding, infection, perforation & drug reaction.  The patient agrees with this plan & written consent will be obtained.

## 2011-02-05 NOTE — Patient Instructions (Signed)
Take your Prilosec 20 mg daily 30 minutes before breakfast You need an EGD with Dr. Jena Gauss

## 2011-02-05 NOTE — Assessment & Plan Note (Signed)
EGD with possible esophageal dilation in the near future.

## 2011-02-05 NOTE — Progress Notes (Signed)
Cc to PCP 

## 2011-02-05 NOTE — Progress Notes (Signed)
Referring Provider: Lilyan Punt, MD, MD Primary Care Physician:  Lilyan Punt, MD, MD Primary Gastroenterologist:  Dr. Jena Gauss  Chief Complaint  Patient presents with  . Gastrophageal Reflux   HPI:  Ruth [name] is a 69 y.o. female here as a referral from Dr.Scott Luking for hx chronic GERD.  She describes symptoms since 1970s.  Worse w/ SSRIs.  Can't sleep lying down, sleeps in chair.  Cannot lay on left side.  Has had both colonoscopy and EGD by both Dr Karilyn Cota & Dr Jarold Motto yrs ago-was told she had a "raw patch in stomach".  Hx h pylori treatment 1990s twice. C/o heartburn, indigestion, chronic cough, regurgitation daily or every several days.  Takes Zantac prn.  Uses prilosec 20mg  but only when necessary.  She knows she should take PPI daily, but only takes as needed because she is "afraid of medicines".  Feels like she needs acid in her stomach & not good to take it away.  C/o dysphagia w/ solids/liquids.  Sensation of things getting stuck in her upper esophagus.  Denies N/V.  Denies rectal bleeding, melena or weight loss.  Alters between constipation & diarrhea.  Wt loss 10# 1 month ago, but gained it back.  Lost almost 15-20# after she started back on prozac.  Stopped prozac due to abdominal pain.    Labs reviewed from 01/23/2011 celiac panel negative CBC normal  Past Medical History  Diagnosis Date  . Anxiety   . GERD (gastroesophageal reflux disease)     EGD/ colon 1/09  . Hyperlipidemia   . OSA (obstructive sleep apnea)   . IBS (irritable bowel syndrome)   . Arthritis   . Depression   . Fibromyalgia     Past Surgical History  Procedure Date  . Cardiac catheterization 2002  . Bilateral salpingoophorectomy 1990s  . Appendectomy   . Knee surgery     Multiple  . Tubal ligation   . Inner ear surgery     Left  . Tonsillectomy   . Abdominal hysterectomy 1970    Current Outpatient Prescriptions  Medication Sig Dispense Refill  . acetaminophen (TYLENOL) 500 MG tablet Take  500 mg by mouth every 6 (six) hours as needed.        . Ascorbic Acid (VITAMIN C) 100 MG tablet Take 100 mg by mouth daily.        Marland Kitchen b complex vitamins tablet Take 1 tablet by mouth daily.        . fish oil-omega-3 fatty acids 1000 MG capsule Take 2 g by mouth daily.        Marland Kitchen omeprazole (PRILOSEC) 20 MG capsule Take 20 mg by mouth daily as needed.         Allergies as of 02/05/2011 - Review Complete 02/05/2011  Allergen Reaction Noted  . Ciprofloxacin  02/11/2009  . Codeine Nausea And Vomiting 02/11/2009    Family History:  Problem Relation Age of Onset  . Diabetes Father   . Heart attack Father   . Lung cancer Father   . Lung cancer Sister   . Hypertension Sister   . Cirrhosis Father 7    etoh cirrhosis    History   Social History  . Marital Status: Married    Spouse Name: N/A    Number of Children: 2  . Years of Education: N/A   Occupational History  . Retired; Engineer, agricultural    Social History Main Topics  . Smoking status: Never Smoker   . Smokeless tobacco: Never Used  .  Alcohol Use: Yes     Occasionally, holidays/special occasions  . Drug Use: No  . Sexually Active: Not on file   Other Topics Concern  . Not on file   Social History Narrative   Married2 grown children, 1 adopted son-autistic  Review of Systems: Gen: Denies any fever, chills, sweats, anorexia, fatigue, weakness, malaise CV: Denies chest pain, angina, palpitations, syncope, orthopnea, PND, peripheral edema, and claudication. Resp: Denies dyspnea at rest, dyspnea with exercise, cough, sputum, wheezing, coughing up blood, and pleurisy. GI: Denies vomiting blood, jaundice, and fecal incontinence. GU : Denies urinary burning, blood in urine, urinary frequency, urinary hesitancy, nocturnal urination, and urinary incontinence. MS: Denies joint pain, limitation of movement, and swelling, stiffness, low back pain, extremity pain. Denies muscle weakness, cramps, atrophy.  Derm: Denies rash, itching,  dry skin, hives, moles, warts, or unhealing ulcers.  Psych: Denies depression, anxiety, memory loss, suicidal ideation, hallucinations, paranoia, and confusion. Heme: Denies bruising, bleeding, and enlarged lymph nodes. Neuro:  Denies any headaches, dizziness, paresthesias. Endo:  Denies any problems with DM, thyroid, adrenal function.  Physical Exam: BP 114/73  Pulse 74  Temp(Src) 97.8 F (36.6 C) (Temporal)  Ht 5\' 5"  (1.651 m)  Wt 206 lb 9.6 oz (93.713 kg)  BMI 34.38 kg/m2 General:   Alert,  Well-developed, well-nourished, pleasant and cooperative in NAD. Head:  Normocephalic and atraumatic. Eyes:  Sclera clear, no icterus.   Conjunctiva pink. Ears:  Normal auditory acuity. Nose:  No deformity, discharge, or lesions. Mouth:  No deformity or lesions,oropharynx pink & moist. Neck:  Supple; no masses or thyromegaly. Lungs:  Clear throughout to auscultation.   No wheezes, crackles, or rhonchi. No acute distress. Heart:  Regular rate and rhythm; no murmurs, clicks, rubs,  or gallops. Abdomen:  Obese. Normal bowel sounds.  No bruits.  Soft, non-tender and non-distended without masses, hepatosplenomegaly or hernias noted.  No guarding or rebound tenderness.   Rectal:  Deferred. Msk:  Symmetrical without gross deformities. Normal posture. Pulses:  Normal pulses noted. Extremities:  No clubbing or edema. Neurologic:  Alert and oriented x4;  grossly normal neurologically. Skin:  Intact without significant lesions or rashes. Lymph Nodes:  No significant cervical adenopathy. Psych:  Alert and cooperative. Normal mood and affect.

## 2011-02-06 ENCOUNTER — Encounter (HOSPITAL_COMMUNITY): Payer: Self-pay

## 2011-02-11 ENCOUNTER — Other Ambulatory Visit (HOSPITAL_COMMUNITY): Payer: Self-pay | Admitting: Internal Medicine

## 2011-02-11 ENCOUNTER — Encounter (HOSPITAL_COMMUNITY): Admission: RE | Payer: Self-pay | Source: Ambulatory Visit

## 2011-02-11 DIAGNOSIS — Z139 Encounter for screening, unspecified: Secondary | ICD-10-CM

## 2011-02-11 SURGERY — EGD (ESOPHAGOGASTRODUODENOSCOPY)
Anesthesia: Moderate Sedation

## 2011-02-13 ENCOUNTER — Other Ambulatory Visit (HOSPITAL_COMMUNITY): Payer: Medicare Other

## 2011-02-13 ENCOUNTER — Ambulatory Visit (HOSPITAL_COMMUNITY): Payer: Medicare Other

## 2011-02-13 ENCOUNTER — Ambulatory Visit (HOSPITAL_COMMUNITY): Admission: RE | Admit: 2011-02-13 | Payer: Medicare Other | Source: Ambulatory Visit | Admitting: Internal Medicine

## 2011-02-17 DIAGNOSIS — J189 Pneumonia, unspecified organism: Secondary | ICD-10-CM

## 2011-02-17 HISTORY — DX: Pneumonia, unspecified organism: J18.9

## 2011-02-19 ENCOUNTER — Ambulatory Visit (HOSPITAL_COMMUNITY): Payer: Medicare Other

## 2011-02-19 ENCOUNTER — Other Ambulatory Visit (HOSPITAL_COMMUNITY): Payer: Medicare Other

## 2011-03-25 ENCOUNTER — Ambulatory Visit (HOSPITAL_COMMUNITY)
Admission: RE | Admit: 2011-03-25 | Discharge: 2011-03-25 | Disposition: A | Discharge status: Home / Self Care | Payer: Medicare Other | Source: Ambulatory Visit | Attending: Family Medicine | Admitting: Family Medicine

## 2011-03-25 ENCOUNTER — Other Ambulatory Visit (HOSPITAL_COMMUNITY): Payer: Self-pay | Admitting: Family Medicine

## 2011-03-25 DIAGNOSIS — R0602 Shortness of breath: Secondary | ICD-10-CM | POA: Insufficient documentation

## 2011-03-25 DIAGNOSIS — R05 Cough: Secondary | ICD-10-CM

## 2011-03-25 DIAGNOSIS — R059 Cough, unspecified: Secondary | ICD-10-CM

## 2011-04-08 ENCOUNTER — Encounter (HOSPITAL_COMMUNITY): Payer: Self-pay | Admitting: Psychiatry

## 2011-04-08 ENCOUNTER — Ambulatory Visit (INDEPENDENT_AMBULATORY_CARE_PROVIDER_SITE_OTHER): Payer: Medicare Other | Admitting: Psychiatry

## 2011-04-08 DIAGNOSIS — F331 Major depressive disorder, recurrent, moderate: Secondary | ICD-10-CM

## 2011-04-08 DIAGNOSIS — F411 Generalized anxiety disorder: Secondary | ICD-10-CM

## 2011-04-08 DIAGNOSIS — F419 Anxiety disorder, unspecified: Secondary | ICD-10-CM
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
## 2011-04-09 NOTE — Patient Instructions (Signed)
Discussed orally 

## 2011-04-16 ENCOUNTER — Encounter (HOSPITAL_COMMUNITY): Payer: Self-pay | Admitting: Psychiatry

## 2011-04-16 ENCOUNTER — Ambulatory Visit (INDEPENDENT_AMBULATORY_CARE_PROVIDER_SITE_OTHER): Payer: Medicare Other | Admitting: Psychiatry

## 2011-04-16 DIAGNOSIS — F331 Major depressive disorder, recurrent, moderate: Secondary | ICD-10-CM

## 2011-04-16 NOTE — Patient Instructions (Signed)
Use diaphragmatic breathing, improve efforts regarding self-care including nutrition and physical activity, schedule an appointment with Dr. Lolly Mustache for medication evaluation, contact PCP in the interim

## 2011-04-16 NOTE — Progress Notes (Signed)
Patient:  Ruth [name]   DOB: 09-21-1940  MR Number: [id number]  Location: [facility]:  [address] [place],  [place], [address]  Start: Thursday 04/16/2011 1:10 PM End: Thursday 04/16/2011 2:00 PM  Provider/Observer:     Florencia Reasons, MSW, LCSW   Chief Complaint:      Chief Complaint  Patient presents with  . Depression    Reason For Service:     The patient is seeking services due to to experiencing depression and anxiety. She states that she cries at everything or is irritated and everything. She reports she can't sleep. She has been battling depression since the 52s and 3s. Her symptoms have worsened in recent months due to overwhelming responsibilities regarding her adopted 108 year old son who biologically is her grandson and who has diagnoses of Asperger's Disorder, OCD, and ADHD. The patient reports having to be available for her son 87 7. She also reports stress related to trying to assist her 63 year old daughter who is her grandson's mother who also needs help managing various issues including taking care of her 21 year old daughter who has emotional issues.  Interventions Strategy:  Supportive therapy  Participation Level:   Active  Participation Quality:  Appropriate      Behavioral Observation:  Well Groomed, Alert, and Depressed.   Current Psychosocial Factors: The patient reports continued stress regarding her son who has Asperger's disorder, ADHD, and OCD  Content of Session:   Reviewing symptoms, processing feelings, discussing referred to psychiatrist, practicing diaphragmatic breathing, identifying ways to improve self-care  Current Status:   Patient continues to experience depressed mood, anxiety, irritability, low energy, and poor motivation. She denies suicidal and homicidal ideations.  Patient Progress:   Poor. The patient reports feeling more overwhelmed. She states having no energy and no motivation. She has stayed home and stayed in her  pajamaso several days since last session. She reports increased cravings for sweets and increased alcohol use saying she consumed 2 beers and a white [place] in one evening last week. Reports continued stress regarding her son and other responsibilities. She admits some of her stresses self-imposed as she worries about others opinions. Therapist and patient discuss possible referral to psychiatrist for medication evaluation. Patient reports history of poor medication compliance as she fears the side effects of medication. However, she is willing to have a medication evaluation and states she thinks she does need medication.  Target Goals:   Improve self-care  Last Reviewed:   04/16/2011  Goals Addressed Today:    Improve self-care  Impression/Diagnosis:   The patient presents with a long-standing history of recurrent periods of depression and anxiety and has had 2 psychiatric hospitalizations due to major depressive episodes. Symptoms have worsened in the past several months and include depressed mood, anxiety, mood swings, loss of interest in activities, irritability, excessive worrying, low energy, poor concentration, and memory problems. The patient reports the overwhelmed with managing life as she has no time for self. She reports her time is consumed with taking care of her adopted son and being available to her daughter and granddaughter. Diagnoses: Maj. depressive disorder, recurrent, moderate; anxiety disorder NOS  Diagnosis:  Axis I:  1. Major depressive disorder, recurrent, moderate             Axis II: Deferred

## 2011-04-24 ENCOUNTER — Ambulatory Visit (INDEPENDENT_AMBULATORY_CARE_PROVIDER_SITE_OTHER): Payer: Medicare Other | Admitting: Psychiatry

## 2011-04-24 DIAGNOSIS — F331 Major depressive disorder, recurrent, moderate: Secondary | ICD-10-CM

## 2011-04-28 ENCOUNTER — Ambulatory Visit (INDEPENDENT_AMBULATORY_CARE_PROVIDER_SITE_OTHER): Payer: Medicare Other | Admitting: Psychiatry

## 2011-04-28 ENCOUNTER — Encounter (HOSPITAL_COMMUNITY): Payer: Self-pay | Admitting: Psychiatry

## 2011-04-28 VITALS — Wt 208.6 lb

## 2011-04-28 DIAGNOSIS — F329 Major depressive disorder, single episode, unspecified: Secondary | ICD-10-CM

## 2011-04-28 MED ORDER — FLUOXETINE HCL 10 MG PO CAPS: 10.0000 mg | ORAL_CAPSULE | Freq: Every day | ORAL | Status: DC

## 2011-04-28 NOTE — Progress Notes (Signed)
Patient:  Ruth [name]   DOB: [birth year]  MR Number: [id number]  Location: [facility]:  [address] [place],  [place], [address]  Start: Friday 04/24/2011 3:05 PM End: Friday 04/24/2011 3:50 PM  Provider/Observer:     Florencia Reasons, MSW, LCSW   Chief Complaint:      Chief Complaint  Patient presents with  . Depression  . Anxiety    Reason For Service:     The patient is seeking services due to to experiencing depression and anxiety. She states that she cries at everything or is irritated and everything. She reports she can't sleep. She has been battling depression since the 85s and 53s. Her symptoms have worsened in recent months due to overwhelming responsibilities regarding her adopted 40 year old son who biologically is her grandson and who has diagnoses of Asperger's Disorder, OCD, and ADHD. The patient reports having to be available for her son 41 7. She also reports stress related to trying to assist her 9 year old daughter who is her grandson's mother who also needs help managing various issues including taking care of her 72 year old daughter who has emotional issues. Patient is seen today for follow up appointment.  Interventions Strategy:  Supportive therapy  Participation Level:   Active  Participation Quality:  Appropriate      Behavioral Observation:  Well Groomed, Alert, and Depressed.   Current Psychosocial Factors: The patient reports continued stress regarding her son who has Asperger's disorder, ADHD, and OCD  Content of Session:   Reviewing symptoms, processing feelings, discussing fears regarding medication, exploring coping techniques  Current Status:   Patient continues to experience depressed mood, anxiety, irritability, low energy, and poor motivation. She reports passive suicidal ideations : "wish I wasn't here, people would be better off without me" - no plan, no intent. The patient agrees to call this practice, call 911, or have someone take  her to the emergency room should symptoms worsen.  Patient Progress:   Poor. The patient reports continuing to feel overwhelmed. She reports continued poor motivation and states she did not dress for 2 days and mainly just sat in a chair. She reports having to push herself to go to appointments. She reports experiencing no pleasure. Patient reports she did take Prozac today.  She expresses ambivalent feelings about seeing psychiatrist due to fear of side effects of medication but agrees to keep appointment. Patient also has continued to experience anxiety but reports she has begun using the diaphragmatic breathing.  Target Goals:   Improve self-care  Last Reviewed:   04/16/2011  Goals Addressed Today:    Improve self-care  Impression/Diagnosis:   The patient presents with a long-standing history of recurrent periods of depression and anxiety and has had 2 psychiatric hospitalizations due to major depressive episodes. Symptoms have worsened in the past several months and include depressed mood, anxiety, mood swings, loss of interest in activities, irritability, excessive worrying, low energy, poor concentration, and memory problems. The patient reports being overwhelmed with managing life as she has no time for self. She reports her time is consumed with taking care of her adopted son and being available to her daughter and granddaughter. Diagnoses: Maj. depressive disorder, recurrent, moderate; anxiety disorder NOS  Diagnosis:  Axis I:  1. Major depressive disorder, recurrent, moderate             Axis II: Deferred

## 2011-04-28 NOTE — Patient Instructions (Signed)
Discussed orally 
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
## 2011-04-30 ENCOUNTER — Ambulatory Visit (INDEPENDENT_AMBULATORY_CARE_PROVIDER_SITE_OTHER): Payer: Medicare Other | Admitting: Psychiatry

## 2011-04-30 DIAGNOSIS — F331 Major depressive disorder, recurrent, moderate: Secondary | ICD-10-CM

## 2011-04-30 NOTE — Progress Notes (Signed)
Patient:  Ruth [name]   DOB: 10-26-1940  MR Number: [id number]  Location: [facility]:  [address] [place],  [place], [address]  Start: Thursday 04/30/2011 2:10 PM End: Thursday 04/30/2011 2:55  PM  Provider/Observer:     Florencia Reasons, MSW, LCSW   Chief Complaint:      Chief Complaint  Patient presents with  . Depression    Reason For Service:     The patient is seeking services due to to experiencing depression and anxiety. She states that she cries at everything or is irritated and everything. She reports she can't sleep. She has been battling depression since the 57s and 67s. Her symptoms have worsened in recent months due to overwhelming responsibilities regarding her adopted 83 year old son who biologically is her grandson and who has diagnoses of Asperger's Disorder, OCD, and ADHD. The patient reports having to be available for her son 96 7. She also reports stress related to trying to assist her 76 year old daughter who is her grandson's mother who also needs help managing various issues including taking care of her 43 year old daughter who has emotional issues. Patient is seen today for follow up appointment.  Interventions Strategy:  Supportive therapy  Participation Level:   Active  Participation Quality:  Appropriate      Behavioral Observation:  Well Groomed, Alert, and talkative  Current Psychosocial Factors: The patient reports continued stress regarding her son who has Asperger's disorder, ADHD, and OCD  Content of Session:   Reviewing symptoms, processing feelings, developing treatment plan, identifying ways to improve self-care  Current Status:   Patient reports continued depressed mood, worry, crying spells, irritability, low energy, and poor motivation but states feeling calmer and experiencing improved sleep pattern.  Patient Progress:   Fair. The patient reports she has been taking an antidepressant regularly and states feeling positive about her  appointment with Dr. Lolly Mustache. She reports she is already beginning to experience some stomach difficulties with the medication but is committed to continuing to use the medication. She will see Dr. Lolly Mustache in 3 weeks. She continues to have constant worry about her son and states wanting to be there for her family but wanted to learn how to help them and maintain her sanity. Patient admits pattern of negative and pessimistic thinking.  Target Goals:   I. Improve mood as evidenced by decreased irritability and resume a normal interest in activities such as dressing daily, perform household tasks. 2. Increase social involvement in activity to at least 1-3 times per week. 3. Identify and replace thoughts that precipitate depression and anxiety. 4 Improve coping and stress management skills.  Last Reviewed:   04/16/2011  Goals Addressed Today:    Improve mood, improve, coping and stress management skills  Impression/Diagnosis:   The patient presents with a long-standing history of recurrent periods of depression and anxiety and has had 2 psychiatric hospitalizations due to major depressive episodes. Symptoms have worsened in the past several months and include depressed mood, anxiety, mood swings, loss of interest in activities, irritability, excessive worrying, low energy, poor concentration, and memory problems. The patient reports being overwhelmed with managing life as she has no time for self. She reports her time is consumed with taking care of her adopted son and being available to her daughter and granddaughter. Diagnoses: Maj. depressive disorder, recurrent, moderate; anxiety disorder NOS  Diagnosis:  Axis I:  1. Major depressive disorder, recurrent, moderate             Axis  II: Deferred

## 2011-04-30 NOTE — Patient Instructions (Addendum)
Improve self-care regarding nutrition,exercise, and activity scheduling continue practicing relaxation breathing

## 2011-05-13 ENCOUNTER — Telehealth (HOSPITAL_COMMUNITY): Payer: Self-pay | Admitting: *Deleted

## 2011-05-14 ENCOUNTER — Ambulatory Visit (INDEPENDENT_AMBULATORY_CARE_PROVIDER_SITE_OTHER): Payer: Medicare Other | Admitting: Psychiatry

## 2011-05-14 ENCOUNTER — Encounter (HOSPITAL_COMMUNITY): Payer: Self-pay | Admitting: Psychiatry

## 2011-05-14 DIAGNOSIS — F331 Major depressive disorder, recurrent, moderate: Secondary | ICD-10-CM

## 2011-05-14 NOTE — Patient Instructions (Signed)
Discussed orally - use handouts

## 2011-05-14 NOTE — Progress Notes (Signed)
Patient:  Ruth [name]   DOB: 09-06-1940  MR Number: [id number]  Location: [facility] Center:  [address] [place],  [place], [address]  Start: Thursday 05/14/2011 1:10 PM End: Thursday 05/14/2011 1:55  PM  Provider/Observer:     Florencia Reasons, MSW, LCSW   Chief Complaint:      Chief Complaint  Patient presents with  . Depression    Reason For Service:     The patient is seeking services due to to experiencing depression and anxiety. She states that she cries at everything or is irritated and everything. She reports she can't sleep. She has been battling depression since the 65s and 64s. Her symptoms have worsened in recent months due to overwhelming responsibilities regarding her adopted 78 year old son who biologically is her grandson and who has diagnoses of Asperger's Disorder, OCD, and ADHD. The patient reports having to be available for her son 100 7. She also reports stress related to trying to assist her 64 year old daughter who is her grandson's mother who also needs help managing various issues including taking care of her 70 year old daughter who has emotional issues. Patient is seen today for follow up appointment.  Interventions Strategy:  Supportive therapy  Participation Level:   Active  Participation Quality:  Appropriate      Behavioral Observation:  Well Groomed, Alert, and talkative  Current Psychosocial Factors: The patient reports continued stress regarding her son who has Asperger's disorder, ADHD, and OCD  Content of Session:   Reviewing symptoms, processing feelings, identifying ways to improve self-care, identifying realistic expectations, identifying thought patterns and effects on mood and behavior  Current Status:   Patient reports continued anxiety and excessive worry but absence of crying spells since last session.  She is experiencing some improvement in mood and increased involvement in activity  Patient Progress:   Fair. The patient reports she  has been feeling better since taking medication. However, she expresses frustration due to continued stomach difficulties with the medication but remains committed to continuing to use the medication. She will see Dr. Lolly Mustache in 3 weeks. She continues to have constant worry about her son and a variety of other issues.  She also admits having unrealistic expectations of self at times and has a tendency to use should statements.  Therapist works with patient to identify thought patterns and effects on mood and behavior.   Target Goals:   I. Improve mood as evidenced by decreased irritability and resume a normal interest in activities such as dressing daily, perform household tasks. 2. Increase social involvement in activity to at least 1-3 times per week. 3. Identify and replace thoughts that precipitate depression and anxiety. 4 Improve coping and stress management skills.  Last Reviewed:   04/16/2011  Goals Addressed Today:    Identifying and replacing thoughts that precipitate depression and anxiety.  Impression/Diagnosis:   The patient presents with a long-standing history of recurrent periods of depression and anxiety and has had 2 psychiatric hospitalizations due to major depressive episodes. Symptoms have worsened in the past several months and include depressed mood, anxiety, mood swings, loss of interest in activities, irritability, excessive worrying, low energy, poor concentration, and memory problems. The patient reports being overwhelmed with managing life as she has no time for self. She reports her time is consumed with taking care of her adopted son and being available to her daughter and granddaughter. Diagnoses: Maj. depressive disorder, recurrent, moderate; anxiety disorder NOS  Diagnosis:  Axis I:  1. Major depressive  disorder, recurrent, moderate             Axis II: Deferred

## 2011-05-19 ENCOUNTER — Ambulatory Visit (INDEPENDENT_AMBULATORY_CARE_PROVIDER_SITE_OTHER): Payer: Medicare Other | Admitting: Psychiatry

## 2011-05-19 ENCOUNTER — Encounter (HOSPITAL_COMMUNITY): Payer: Self-pay | Admitting: Psychiatry

## 2011-05-19 DIAGNOSIS — F329 Major depressive disorder, single episode, unspecified: Secondary | ICD-10-CM

## 2011-05-19 MED ORDER — FLUOXETINE HCL 20 MG PO CAPS: 20.0000 mg | ORAL_CAPSULE | Freq: Every day | ORAL | Status: DC

## 2011-05-19 MED ORDER — TRAZODONE 25 MG HALF TABLET: 25.0000 mg | ORAL_TABLET | Freq: Every day | ORAL | Status: DC
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
## 2011-05-28 ENCOUNTER — Ambulatory Visit (HOSPITAL_COMMUNITY): Payer: Self-pay | Admitting: Psychiatry

## 2011-06-04 ENCOUNTER — Ambulatory Visit (INDEPENDENT_AMBULATORY_CARE_PROVIDER_SITE_OTHER): Payer: Self-pay | Admitting: Otolaryngology

## 2011-06-18 ENCOUNTER — Ambulatory Visit (INDEPENDENT_AMBULATORY_CARE_PROVIDER_SITE_OTHER): Payer: Medicare Other | Admitting: Psychiatry

## 2011-06-18 ENCOUNTER — Encounter (HOSPITAL_COMMUNITY): Payer: Self-pay | Admitting: Psychiatry

## 2011-06-18 DIAGNOSIS — F329 Major depressive disorder, single episode, unspecified: Secondary | ICD-10-CM

## 2011-06-18 DIAGNOSIS — F32A Depression, unspecified: Secondary | ICD-10-CM

## 2011-06-18 MED ORDER — FLUOXETINE HCL 10 MG PO CAPS: ORAL_CAPSULE | ORAL | Status: DC
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
## 2011-06-19 ENCOUNTER — Ambulatory Visit (INDEPENDENT_AMBULATORY_CARE_PROVIDER_SITE_OTHER): Payer: Medicare Other | Admitting: Psychiatry

## 2011-06-19 DIAGNOSIS — F331 Major depressive disorder, recurrent, moderate: Secondary | ICD-10-CM

## 2011-06-22 NOTE — Patient Instructions (Signed)
Discussed orally 

## 2011-06-22 NOTE — Progress Notes (Signed)
Patient:  Ruth [name]   DOB: Feb 23, 1940  MR Number: [id number]  [facility]:  [address] [place],  [place], [address]  Start: Friday 06/19/2011 1:10 PM End: Friday 06/19/2011 1:40  PM  Provider/Observer:     Florencia Reasons, MSW, LCSW   Chief Complaint:      Chief Complaint  Patient presents with  . Depression    Reason For Service:     The patient is seeking services due to to experiencing depression and anxiety. She states that she cries at everything or is irritated and everything. She reports she can't sleep. She has been battling depression since the 10s and 64s. Her symptoms have worsened in recent months due to overwhelming responsibilities regarding her adopted 30 year old son who biologically is her grandson and who has diagnoses of Asperger's Disorder, OCD, and ADHD. The patient reports having to be available for her son 69 7. She also reports stress related to trying to assist her 48 year old daughter who is her grandson's mother who also needs help managing various issues including taking care of her 61 year old daughter who has emotional issues. Patient is seen today for follow up appointment.  Interventions Strategy:  Supportive therapy  Participation Level:   Active  Participation Quality:  Appropriate      Behavioral Observation:  Well Groomed, Alert, and talkative  Current Psychosocial Factors: The patient reports no new stressors  Content of Session:   Reviewing symptoms, processing feelings, identifying patient's use of coping skills, termination  Current Status:   Patient reports improved mood, decreased anxiety, decreased worry, and absence of crying spells.  Patient Progress:   Good. The patient reports continuing to feel better since taking medication but admits she discontinued for two days as she began to question whether or not she needed medication.  However, upon further reflection, she decided she did need to stick with it and has  resumed medication.  Patient reports to her son moved into a house next door. This has decreased patient's stress significantly. She reports that she has been using  diaphragmatic breathing and has been challenging negative thoughts. She is pleased with her efforts and has decided to discontinue treatment at this time. Therapist encourages patient to contact this practice should she need therapy in the future. Patient will continue seeing Dr. Lolly Mustache for medication management.  Target Goals:   I. Improve mood as evidenced by decreased irritability and resume a normal interest in activities such as dressing daily, perform household tasks. 2. Increase social involvement in activity to at least 1-3 times per week. 3. Identify and replace thoughts that precipitate depression and anxiety. 4 Improve coping and stress management skills.  Last Reviewed:   04/16/2011  Goals Addressed Today:    Identifying and replacing thoughts that precipitate depression and anxiety.  Impression/Diagnosis:   The patient presents with a long-standing history of recurrent periods of depression and anxiety and has had 2 psychiatric hospitalizations due to major depressive episodes. Symptoms have worsened in the past several months and include depressed mood, anxiety, mood swings, loss of interest in activities, irritability, excessive worrying, low energy, poor concentration, and memory problems. The patient reports being overwhelmed with managing life as she has no time for self. She reports her time is consumed with taking care of her adopted son and being available to her daughter and granddaughter. Diagnoses: Maj. depressive disorder, recurrent, moderate; anxiety disorder NOS  Diagnosis:  Axis I:  1. Major depressive disorder, recurrent, moderate  Axis II: Deferred

## 2011-06-25 ENCOUNTER — Ambulatory Visit (INDEPENDENT_AMBULATORY_CARE_PROVIDER_SITE_OTHER): Payer: Medicare Other | Admitting: Otolaryngology

## 2011-06-25 DIAGNOSIS — H6990 Unspecified Eustachian tube disorder, unspecified ear: Secondary | ICD-10-CM

## 2011-06-25 DIAGNOSIS — H7200 Central perforation of tympanic membrane, unspecified ear: Secondary | ICD-10-CM

## 2011-06-25 DIAGNOSIS — H6980 Other specified disorders of Eustachian tube, unspecified ear: Secondary | ICD-10-CM

## 2011-06-30 ENCOUNTER — Other Ambulatory Visit (HOSPITAL_COMMUNITY): Payer: Self-pay | Admitting: Psychiatry

## 2011-06-30 ENCOUNTER — Telehealth (HOSPITAL_COMMUNITY): Payer: Self-pay | Admitting: *Deleted

## 2011-06-30 NOTE — Telephone Encounter (Signed)
Call returned.  Left message to reduce her Prozac to 20 mg due to GI side effects.

## 2011-07-09 ENCOUNTER — Encounter (HOSPITAL_COMMUNITY): Payer: Self-pay | Admitting: Psychiatry

## 2011-07-09 ENCOUNTER — Ambulatory Visit (INDEPENDENT_AMBULATORY_CARE_PROVIDER_SITE_OTHER): Payer: Medicare Other | Admitting: Psychiatry

## 2011-07-09 VITALS — Wt 215.0 lb

## 2011-07-09 DIAGNOSIS — F329 Major depressive disorder, single episode, unspecified: Secondary | ICD-10-CM

## 2011-07-09 MED ORDER — DESVENLAFAXINE SUCCINATE ER 50 MG PO TB24: 50.0000 mg | ORAL_TABLET | Freq: Every day | ORAL | Status: DC
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
## 2011-08-04 ENCOUNTER — Ambulatory Visit (HOSPITAL_COMMUNITY): Payer: Self-pay | Admitting: Psychiatry

## 2011-09-17 ENCOUNTER — Ambulatory Visit (HOSPITAL_COMMUNITY)
Admission: RE | Admit: 2011-09-17 | Discharge: 2011-09-17 | Disposition: A | Discharge status: Home / Self Care | Payer: Medicare Other | Source: Ambulatory Visit | Attending: Internal Medicine | Admitting: Internal Medicine

## 2011-09-17 ENCOUNTER — Other Ambulatory Visit (HOSPITAL_COMMUNITY): Payer: Self-pay | Admitting: Internal Medicine

## 2011-09-17 DIAGNOSIS — R079 Chest pain, unspecified: Secondary | ICD-10-CM | POA: Insufficient documentation

## 2011-12-29 ENCOUNTER — Other Ambulatory Visit: Payer: Self-pay | Admitting: Family Medicine

## 2011-12-29 DIAGNOSIS — Z139 Encounter for screening, unspecified: Secondary | ICD-10-CM

## 2012-01-04 ENCOUNTER — Ambulatory Visit (HOSPITAL_COMMUNITY)
Admission: RE | Admit: 2012-01-04 | Discharge: 2012-01-04 | Disposition: A | Discharge status: Home / Self Care | Payer: Medicare Other | Source: Ambulatory Visit | Attending: Family Medicine | Admitting: Family Medicine

## 2012-01-04 DIAGNOSIS — Z1231 Encounter for screening mammogram for malignant neoplasm of breast: Secondary | ICD-10-CM | POA: Insufficient documentation

## 2012-01-04 DIAGNOSIS — Z139 Encounter for screening, unspecified: Secondary | ICD-10-CM

## 2012-01-08 LAB — CBC
Granulocyte percent: 45 %G (ref 37–80)
HCT: 39 %
Hemoglobin: 13.3 g/dL (ref 12.0–16.0)
WBC: 5
platelet count: 289

## 2012-01-08 LAB — BASIC METABOLIC PANEL
ALT: 18 U/L (ref 7–35)
AST: 16 U/L
Albumin: 4.6
Alkaline Phosphatase: 75 U/L
BUN: 15 mg/dL (ref 4–21)
Bilirubin, Direct: 0.1 mg/dL (ref 0.01–0.4)
Bilirubin, Indirect: 0.3
CO2: 30 mmol/L
Calcium: 9.7 mg/dL
Chloride: 104 mmol/L
Cholesterol: 275 mg/dL — AB (ref 0–200)
Creat: 0.69
Glucose: 100
Hgb A1c MFr Bld: 6.2 % — AB (ref 4.0–6.0)
Potassium: 4.9 mmol/L
Sodium: 140 mmol/L (ref 137–147)
TSH: 2.28 u[IU]/mL (ref 0.41–5.90)
Total Bilirubin: 0.4 mg/dL
Total Protein: 6.7 g/dL

## 2012-02-01 ENCOUNTER — Ambulatory Visit (INDEPENDENT_AMBULATORY_CARE_PROVIDER_SITE_OTHER): Payer: Medicare Other | Admitting: Urgent Care

## 2012-02-01 ENCOUNTER — Encounter: Payer: Self-pay | Admitting: Urgent Care

## 2012-02-01 VITALS — BP 132/78 | HR 86 | Temp 98.5°F | Ht 65.0 in | Wt 219.8 lb

## 2012-02-01 DIAGNOSIS — K589 Irritable bowel syndrome without diarrhea: Secondary | ICD-10-CM

## 2012-02-01 DIAGNOSIS — R1013 Epigastric pain: Secondary | ICD-10-CM

## 2012-02-01 DIAGNOSIS — K219 Gastro-esophageal reflux disease without esophagitis: Secondary | ICD-10-CM

## 2012-02-01 DIAGNOSIS — K3189 Other diseases of stomach and duodenum: Secondary | ICD-10-CM

## 2012-02-01 MED ORDER — PEG 3350-KCL-NA BICARB-NACL 420 G PO SOLR: 4000.0000 mL | ORAL | Status: DC

## 2012-02-01 NOTE — Progress Notes (Addendum)
Referring Provider: Lilyan Punt Primary Care Physician:  Dr Lilyan Punt Primary Gastroenterologist:  Dr. Jena Gauss  Chief Complaint  Patient presents with  . Colonoscopy  . EGD   HPI:  Ruth [name] is a 71 y.o. female here to set up EGD & colonoscopy.    She was last seen by me 1 year ago.  EGD was recommended at that time, however she did not follow through.  She continues to have daily heartburn & indigestion.  Every time she takes an anti-depressant, it makes stomach worse.  C/o epigastric pain & pain up her esophagus.  She wakes up coughing couple nights per week.  She takes prilosec 20mg  daily & zantac 1-2 daily prn.  She even "strangles easily on saliva".  She denies dyphagia or odynophagia.  +regurgitation within minutes of eating.  She denies nausea or vomiting.  Her appetite has been fine.  Her weight is stable.  Denies NSAIDs.  She has been taking probiotics daily & eats a lot of fiber.  She is having BMs once daily.  Hx IBS-mixed, more constipation predominant.     She reports both colonoscopy and EGD by both Dr Karilyn Cota & Dr Jarold Motto yrs ago-was told she had a "raw patch in stomach".  Hx h pylori treatment 1990s twice.  Labs reviewed from 01/23/2011 celiac ab serology negative & CBC normal.  Labs from 01/08/2012 Dr. Fletcher Anon CBC, met 7, LFTs and TSH normal. Hemoccult A1c 6.2. Glucose 100.  Past Medical History  Diagnosis Date  . Anxiety   . GERD (gastroesophageal reflux disease)     EGD/ colon 1/09  . Hyperlipidemia   . OSA (obstructive sleep apnea)   . IBS (irritable bowel syndrome)   . Arthritis   . Depression   . Fibromyalgia   . Refusal of blood transfusions as patient is Jehovah's Witness   . H pylori ulcer 1980-1990    s/p treatment    Past Surgical History  Procedure Date  . Cardiac catheterization 2002  . Bilateral salpingoophorectomy 1990s  . Appendectomy   . Knee surgery     Multiple  . Tubal ligation   . Inner ear surgery     Left  . Tonsillectomy   .  Abdominal hysterectomy 1970  . Colonoscopy 01/2007    Dr. Laurie Panda  . Esophagogastroduodenoscopy 01/2007    Dr Jarold Motto- 3 cm hiatal hernia, benign esophageal biopsies, erosive esophagitis, gastritis  . Shoulder surgery 2011    Current Outpatient Prescriptions  Medication Sig Dispense Refill  . acetaminophen (TYLENOL) 500 MG tablet Take 500 mg by mouth every 6 (six) hours as needed. For pain       . b complex vitamins tablet Take 1 tablet by mouth daily.        . Coenzyme Q10 (CO Q 10) 10 MG CAPS Take 1 capsule by mouth daily.        . diazepam (VALIUM) 5 MG tablet Take 5 mg by mouth every 6 (six) hours as needed.      . dicyclomine (BENTYL) 10 MG capsule Take 10 mg by mouth as needed. For stomach cramps       . fish oil-omega-3 fatty acids 1000 MG capsule Take 2 g by mouth daily.        . Omeprazole (PRILOSEC PO) Take 20 mg by mouth daily.       . Probiotic Product (PROBIOTIC DAILY PO) Take 2 capsules by mouth daily.      . ranitidine (ZANTAC) 300 MG tablet Take  1 tablet by mouth Twice daily as needed. For acid reflux      . traMADol (ULTRAM) 50 MG tablet Take 1 tablet by mouth Every 6 hours as needed.      . vitamin C (ASCORBIC ACID) 500 MG tablet Take 500-1,000 mg by mouth daily.        . polyethylene glycol-electrolytes (TRILYTE) 420 G solution Take 4,000 mLs by mouth as directed.  4000 mL  0    Allergies as of 02/01/2012 - Review Complete 02/01/2012  Allergen Reaction Noted  . Ciprofloxacin Other (See Comments) 02/11/2009  . Codeine Nausea And Vomiting 02/11/2009    Family History:  Problem Relation Age of Onset  . Diabetes Father   . Heart attack Father   . Lung cancer Father   . Lung cancer Sister   . Hypertension Sister   . Cirrhosis Father 33    etoh cirrhosis    History   Social History  . Marital Status: Married    Spouse Name: N/A    Number of Children: 2  . Years of Education: N/A   Occupational History  . Retired; Engineer, agricultural    Social  History Main Topics  . Smoking status: Never Smoker   . Smokeless tobacco: Never Used  . Alcohol Use: No     Comment: Occasionally, holidays/special occasions  . Drug Use: No  . Sexually Active: Not on file   Other Topics Concern  . Not on file   Social History Narrative   Married2 grown children, 1 adopted son-autistic-lives next door  Review of Systems: Gen: Denies any fever, chills, sweats, anorexia, fatigue, weakness, malaise CV: Denies chest pain, angina, palpitations, syncope, orthopnea, PND, peripheral edema, and claudication. Resp: Denies dyspnea at rest, dyspnea with exercise, cough, sputum, wheezing, coughing up blood, and pleurisy. GI: Denies vomiting blood, jaundice, and fecal incontinence. GU : Denies urinary burning, blood in urine, urinary frequency, urinary hesitancy, nocturnal urination, and urinary incontinence. MS: Denies joint pain, limitation of movement, and swelling, stiffness, low back pain, extremity pain. Denies muscle weakness, cramps, atrophy.  Derm: Denies rash, itching, dry skin, hives, moles, warts, or unhealing ulcers.  Psych: Denies depression, anxiety, memory loss, suicidal ideation, hallucinations, paranoia, and confusion. Heme: Denies bruising, bleeding, and enlarged lymph nodes. Neuro:  Denies any headaches, dizziness, paresthesias. Endo:  Denies any problems with DM, thyroid, adrenal function.  Physical Exam: BP 132/78  Pulse 86  Temp 98.5 F (36.9 C) (Oral)  Ht 5\' 5"  (1.651 m)  Wt 219 lb 12.8 oz (99.701 kg)  BMI 36.58 kg/m2 General:   Alert,  Well-developed, well-nourished, pleasant and cooperative in NAD. Head:  Normocephalic and atraumatic. Eyes:  Sclera clear, no icterus.   Conjunctiva pink. Ears:  Normal auditory acuity. Nose:  No deformity, discharge, or lesions. Mouth:  No deformity or lesions,oropharynx pink & moist. Neck:  Supple; no masses or thyromegaly. Lungs:  Clear throughout to auscultation.   No wheezes, crackles, or  rhonchi. No acute distress. Heart:  Regular rate and rhythm; no murmurs, clicks, rubs,  or gallops. Abdomen:  Obese. Normal bowel sounds.  No bruits.  Soft, non-tender and non-distended without masses, hepatosplenomegaly or hernias noted.  No guarding or rebound tenderness.   Rectal:  Deferred. Msk:  Symmetrical without gross deformities. Normal posture. Pulses:  Normal pulses noted. Extremities:  No clubbing or edema. Neurologic:  Alert and oriented x4;  grossly normal neurologically. Skin:  Intact without significant lesions or rashes. Lymph Nodes:  No significant cervical adenopathy. Psych:  Alert and cooperative. Normal mood and affect.

## 2012-02-01 NOTE — Patient Instructions (Addendum)
Screening colonoscopy & EGD with Dr Jena Gauss Omeprazole 20mg  daily for acid reflux Continue probiotics daily

## 2012-02-02 ENCOUNTER — Encounter (HOSPITAL_COMMUNITY): Payer: Self-pay | Admitting: Pharmacy Technician

## 2012-02-02 ENCOUNTER — Encounter: Payer: Self-pay | Admitting: Urgent Care

## 2012-02-02 DIAGNOSIS — K30 Functional dyspepsia: Secondary | ICD-10-CM | POA: Insufficient documentation

## 2012-02-02 DIAGNOSIS — K219 Gastro-esophageal reflux disease without esophagitis: Secondary | ICD-10-CM | POA: Insufficient documentation

## 2012-02-02 DIAGNOSIS — R1013 Epigastric pain: Secondary | ICD-10-CM | POA: Insufficient documentation

## 2012-02-02 DIAGNOSIS — G8929 Other chronic pain: Secondary | ICD-10-CM | POA: Insufficient documentation

## 2012-02-02 NOTE — Assessment & Plan Note (Addendum)
Mixed IBS, more constipation predominant.  She is doing well with probiotics. She is due for screening colonoscopy per Dr Jarold Motto given FH colon polyps.  Colonoscopy with Dr. Jena Gauss. I have discussed risks & benefits which include, but are not limited to, bleeding, infection, perforation & drug reaction.  The patient agrees with this plan & written consent will be obtained.    Continue probiotics daily

## 2012-02-02 NOTE — Progress Notes (Signed)
Faxed to PCP

## 2012-02-02 NOTE — Assessment & Plan Note (Signed)
Ruth [name] is a pleasant 71 y.o. female with chronic GERD and dyspepsia despite daily omeprazole 20 mg. EGD for further evaluation to look for gastritis, refractory H. pylori, peptic ulcer disease or erosive esophagitis with Dr. Jena Gauss.  I have discussed risks & benefits which include, but are not limited to, bleeding, infection, perforation & drug reaction.  The patient agrees with this plan & written consent will be obtained.

## 2012-02-03 NOTE — Progress Notes (Signed)
Quick Note:    Reviewed at office visit  ______

## 2012-02-16 ENCOUNTER — Encounter (HOSPITAL_COMMUNITY)
Admission: RE | Disposition: A | Discharge status: Home / Self Care | Payer: Self-pay | Source: Ambulatory Visit | Attending: Internal Medicine

## 2012-02-16 ENCOUNTER — Encounter (HOSPITAL_COMMUNITY): Payer: Self-pay | Admitting: *Deleted

## 2012-02-16 ENCOUNTER — Other Ambulatory Visit: Payer: Self-pay | Admitting: Internal Medicine

## 2012-02-16 ENCOUNTER — Ambulatory Visit (HOSPITAL_COMMUNITY)
Admission: RE | Admit: 2012-02-16 | Discharge: 2012-02-16 | Disposition: A | Discharge status: Home / Self Care | Payer: Medicare Other | Source: Ambulatory Visit | Attending: Internal Medicine | Admitting: Internal Medicine

## 2012-02-16 DIAGNOSIS — E785 Hyperlipidemia, unspecified: Secondary | ICD-10-CM | POA: Insufficient documentation

## 2012-02-16 DIAGNOSIS — K219 Gastro-esophageal reflux disease without esophagitis: Secondary | ICD-10-CM

## 2012-02-16 DIAGNOSIS — K5730 Diverticulosis of large intestine without perforation or abscess without bleeding: Secondary | ICD-10-CM

## 2012-02-16 DIAGNOSIS — D126 Benign neoplasm of colon, unspecified: Secondary | ICD-10-CM

## 2012-02-16 DIAGNOSIS — R1013 Epigastric pain: Secondary | ICD-10-CM

## 2012-02-16 DIAGNOSIS — K2100 Gastro-esophageal reflux disease with esophagitis, without bleeding: Secondary | ICD-10-CM | POA: Insufficient documentation

## 2012-02-16 DIAGNOSIS — K3189 Other diseases of stomach and duodenum: Secondary | ICD-10-CM

## 2012-02-16 DIAGNOSIS — Z1211 Encounter for screening for malignant neoplasm of colon: Secondary | ICD-10-CM | POA: Insufficient documentation

## 2012-02-16 DIAGNOSIS — K449 Diaphragmatic hernia without obstruction or gangrene: Secondary | ICD-10-CM | POA: Insufficient documentation

## 2012-02-16 HISTORY — PX: COLONOSCOPY WITH ESOPHAGOGASTRODUODENOSCOPY (EGD): SHX5779

## 2012-02-16 SURGERY — COLONOSCOPY WITH ESOPHAGOGASTRODUODENOSCOPY (EGD)
Anesthesia: Moderate Sedation

## 2012-02-16 MED ORDER — ONDANSETRON HCL 4 MG/2ML IJ SOLN
INTRAMUSCULAR | Status: AC
  Filled 2012-02-16: qty 2

## 2012-02-16 MED ORDER — MIDAZOLAM HCL 2 MG/2ML IJ SOLN
INTRAMUSCULAR | Status: AC
  Filled 2012-02-16: qty 2

## 2012-02-16 MED ORDER — MEPERIDINE HCL 100 MG/ML IJ SOLN
INTRAMUSCULAR | Status: AC
  Filled 2012-02-16: qty 2

## 2012-02-16 MED ORDER — PROMETHAZINE HCL 25 MG/ML IJ SOLN
INTRAMUSCULAR | Status: AC
  Filled 2012-02-16: qty 1

## 2012-02-16 MED ORDER — DEXLANSOPRAZOLE 60 MG PO CPDR: 60.0000 mg | DELAYED_RELEASE_CAPSULE | Freq: Every day | ORAL | Status: DC

## 2012-02-16 MED ORDER — PROMETHAZINE HCL 25 MG/ML IJ SOLN
12.5000 mg | Freq: Once | INTRAMUSCULAR | Status: AC
  Administered 2012-02-16: 12.5 mg via INTRAVENOUS

## 2012-02-16 MED ORDER — MIDAZOLAM HCL 5 MG/5ML IJ SOLN
INTRAMUSCULAR | Status: AC
  Filled 2012-02-16: qty 10

## 2012-02-16 MED ORDER — MEPERIDINE HCL 100 MG/ML IJ SOLN
INTRAMUSCULAR | Status: DC | PRN
  Administered 2012-02-16 (×2): 25 mg via INTRAVENOUS

## 2012-02-16 MED ORDER — ONDANSETRON HCL 4 MG/2ML IJ SOLN
INTRAMUSCULAR | Status: DC | PRN
  Administered 2012-02-16: 4 mg via INTRAVENOUS

## 2012-02-16 MED ORDER — SODIUM CHLORIDE 0.9 % IJ SOLN
INTRAMUSCULAR | Status: AC
  Filled 2012-02-16: qty 10

## 2012-02-16 MED ORDER — MIDAZOLAM HCL 5 MG/5ML IJ SOLN
INTRAMUSCULAR | Status: DC | PRN
  Administered 2012-02-16 (×6): 1 mg via INTRAVENOUS

## 2012-02-16 MED ORDER — STERILE WATER FOR IRRIGATION IR SOLN
Status: DC | PRN
  Administered 2012-02-16: 09:00:00

## 2012-02-16 MED ORDER — SODIUM CHLORIDE 0.45 % IV SOLN
INTRAVENOUS | Status: DC
  Administered 2012-02-16: 09:00:00 via INTRAVENOUS

## 2012-02-16 MED ORDER — BUTAMBEN-TETRACAINE-BENZOCAINE 2-2-14 % EX AERO
INHALATION_SPRAY | CUTANEOUS | Status: DC | PRN
  Administered 2012-02-16: 2 via TOPICAL

## 2012-02-16 MED ORDER — MIDAZOLAM HCL 2 MG/2ML IJ SOLN
2.0000 mg | Freq: Once | INTRAMUSCULAR | Status: AC
  Administered 2012-02-16: 2 mg via INTRAVENOUS

## 2012-02-16 NOTE — Interval H&P Note (Signed)
History and Physical Interval Note:  02/16/2012 9:16 AM  Ruth [name]  has presented today for surgery, with the diagnosis of DYSPEPSIA AND GERD  The various methods of treatment have been discussed with the patient and family. After consideration of risks, benefits and other options for treatment, the patient has consented to  Procedure(s) (LRB) with comments: COLONOSCOPY WITH ESOPHAGOGASTRODUODENOSCOPY (EGD) (N/A) - 8:45 as a surgical intervention .  The patient's history has been reviewed, patient examined, no change in status, stable for surgery.  I have reviewed the patient's chart and labs.  Questions were answered to the patient's satisfaction.     Ruth [name]  Patient's symptoms reviewed at bedside. As outlined above and as recent H&P note. Patient quite anxious this morning. I went into some detail regarding the risk and benefits, alternatives, imponderables with this nice lady today. Questions have been answered. She is agreeable.

## 2012-02-16 NOTE — H&P (View-Only) (Signed)
Referring Provider: Scott Luking Primary Care Physician:  Dr Scott Luking Primary Gastroenterologist:  Dr. Rourk  Chief Complaint  Patient presents with  . Colonoscopy  . EGD   HPI:  Ruth [name] is a 71 y.o. female here to set up EGD & colonoscopy.    She was last seen by me 1 year ago.  EGD was recommended at that time, however she did not follow through.  She continues to have daily heartburn & indigestion.  Every time she takes an anti-depressant, it makes stomach worse.  C/o epigastric pain & pain up her esophagus.  She wakes up coughing couple nights per week.  She takes prilosec 20mg daily & zantac 1-2 daily prn.  She even "strangles easily on saliva".  She denies dyphagia or odynophagia.  +regurgitation within minutes of eating.  She denies nausea or vomiting.  Her appetite has been fine.  Her weight is stable.  Denies NSAIDs.  She has been taking probiotics daily & eats a lot of fiber.  She is having BMs once daily.  Hx IBS-mixed, more constipation predominant.     She reports both colonoscopy and EGD by both Dr Rehman & Dr Patterson yrs ago-was told she had a "raw patch in stomach".  Hx h pylori treatment 1990s twice.  Labs reviewed from 01/23/2011 celiac ab serology negative & CBC normal.  Labs from 01/08/2012 Dr. Luking's CBC, met 7, LFTs and TSH normal. Hemoccult A1c 6.2. Glucose 100.  Past Medical History  Diagnosis Date  . Anxiety   . GERD (gastroesophageal reflux disease)     EGD/ colon 1/09  . Hyperlipidemia   . OSA (obstructive sleep apnea)   . IBS (irritable bowel syndrome)   . Arthritis   . Depression   . Fibromyalgia   . Refusal of blood transfusions as patient is Jehovah's Witness   . H pylori ulcer 1980-1990    s/p treatment    Past Surgical History  Procedure Date  . Cardiac catheterization 2002  . Bilateral salpingoophorectomy 1990s  . Appendectomy   . Knee surgery     Multiple  . Tubal ligation   . Inner ear surgery     Left  . Tonsillectomy   .  Abdominal hysterectomy 1970  . Colonoscopy 01/2007    Dr. Patterson-normal  . Esophagogastroduodenoscopy 01/2007    Dr Patterson- 3 cm hiatal hernia, benign esophageal biopsies, erosive esophagitis, gastritis  . Shoulder surgery 2011    Current Outpatient Prescriptions  Medication Sig Dispense Refill  . acetaminophen (TYLENOL) 500 MG tablet Take 500 mg by mouth every 6 (six) hours as needed. For pain       . b complex vitamins tablet Take 1 tablet by mouth daily.        . Coenzyme Q10 (CO Q 10) 10 MG CAPS Take 1 capsule by mouth daily.        . diazepam (VALIUM) 5 MG tablet Take 5 mg by mouth every 6 (six) hours as needed.      . dicyclomine (BENTYL) 10 MG capsule Take 10 mg by mouth as needed. For stomach cramps       . fish oil-omega-3 fatty acids 1000 MG capsule Take 2 g by mouth daily.        . Omeprazole (PRILOSEC PO) Take 20 mg by mouth daily.       . Probiotic Product (PROBIOTIC DAILY PO) Take 2 capsules by mouth daily.      . ranitidine (ZANTAC) 300 MG tablet Take   1 tablet by mouth Twice daily as needed. For acid reflux      . traMADol (ULTRAM) 50 MG tablet Take 1 tablet by mouth Every 6 hours as needed.      . vitamin C (ASCORBIC ACID) 500 MG tablet Take 500-1,000 mg by mouth daily.        . polyethylene glycol-electrolytes (TRILYTE) 420 G solution Take 4,000 mLs by mouth as directed.  4000 mL  0    Allergies as of 02/01/2012 - Review Complete 02/01/2012  Allergen Reaction Noted  . Ciprofloxacin Other (See Comments) 02/11/2009  . Codeine Nausea And Vomiting 02/11/2009    Family History:  Problem Relation Age of Onset  . Diabetes Father   . Heart attack Father   . Lung cancer Father   . Lung cancer Sister   . Hypertension Sister   . Cirrhosis Father 53    etoh cirrhosis    History   Social History  . Marital Status: Married    Spouse Name: N/A    Number of Children: 2  . Years of Education: N/A   Occupational History  . Retired; Opthalmic tech    Social  History Main Topics  . Smoking status: Never Smoker   . Smokeless tobacco: Never Used  . Alcohol Use: No     Comment: Occasionally, holidays/special occasions  . Drug Use: No  . Sexually Active: Not on file   Other Topics Concern  . Not on file   Social History Narrative   Married2 grown children, 1 adopted son-autistic-lives next door  Review of Systems: Gen: Denies any fever, chills, sweats, anorexia, fatigue, weakness, malaise CV: Denies chest pain, angina, palpitations, syncope, orthopnea, PND, peripheral edema, and claudication. Resp: Denies dyspnea at rest, dyspnea with exercise, cough, sputum, wheezing, coughing up blood, and pleurisy. GI: Denies vomiting blood, jaundice, and fecal incontinence. GU : Denies urinary burning, blood in urine, urinary frequency, urinary hesitancy, nocturnal urination, and urinary incontinence. MS: Denies joint pain, limitation of movement, and swelling, stiffness, low back pain, extremity pain. Denies muscle weakness, cramps, atrophy.  Derm: Denies rash, itching, dry skin, hives, moles, warts, or unhealing ulcers.  Psych: Denies depression, anxiety, memory loss, suicidal ideation, hallucinations, paranoia, and confusion. Heme: Denies bruising, bleeding, and enlarged lymph nodes. Neuro:  Denies any headaches, dizziness, paresthesias. Endo:  Denies any problems with DM, thyroid, adrenal function.  Physical Exam: BP 132/78  Pulse 86  Temp 98.5 F (36.9 C) (Oral)  Ht 5' 5" (1.651 m)  Wt 219 lb 12.8 oz (99.701 kg)  BMI 36.58 kg/m2 General:   Alert,  Well-developed, well-nourished, pleasant and cooperative in NAD. Head:  Normocephalic and atraumatic. Eyes:  Sclera clear, no icterus.   Conjunctiva pink. Ears:  Normal auditory acuity. Nose:  No deformity, discharge, or lesions. Mouth:  No deformity or lesions,oropharynx pink & moist. Neck:  Supple; no masses or thyromegaly. Lungs:  Clear throughout to auscultation.   No wheezes, crackles, or  rhonchi. No acute distress. Heart:  Regular rate and rhythm; no murmurs, clicks, rubs,  or gallops. Abdomen:  Obese. Normal bowel sounds.  No bruits.  Soft, non-tender and non-distended without masses, hepatosplenomegaly or hernias noted.  No guarding or rebound tenderness.   Rectal:  Deferred. Msk:  Symmetrical without gross deformities. Normal posture. Pulses:  Normal pulses noted. Extremities:  No clubbing or edema. Neurologic:  Alert and oriented x4;  grossly normal neurologically. Skin:  Intact without significant lesions or rashes. Lymph Nodes:  No significant cervical adenopathy. Psych:    Alert and cooperative. Normal mood and affect.   

## 2012-02-16 NOTE — Op Note (Signed)
[facility] [address] [place], [address]   ENDOSCOPY PROCEDURE REPORT  PATIENT: Ruth [name], Ruth [name]  MR#: [id number] BIRTHDATE: 1940/10/20 , 71  yrs. old GENDER: Female ENDOSCOPIST: R.  Roetta Sessions, MD FACP FACG REFERRED BY:  Lilyan Punt, M.D. PROCEDURE DATE:  02/16/2012 PROCEDURE:     Diagnostic EGD  INDICATIONS:     Refractory GERD and dyspepsia  INFORMED CONSENT:   The risks, benefits, limitations, alternatives and imponderables have been discussed.  The potential for biopsy, esophogeal dilation, etc. have also been reviewed.  Questions have been answered.  All parties agreeable.  Please see the history and physical in the medical record for more information.  MEDICATIONS:      Versed 4 mg IV and Demerol 25 mg IV and Phenergan 12.5 mg IV in divided doses. Cetacaine spray.  DESCRIPTION OF PROCEDURE:   The EG-2990i ([id number])  endoscope was introduced through the mouth and advanced to the second portion of the duodenum without difficulty or limitations.  The mucosal surfaces were surveyed very carefully during advancement of the scope and upon withdrawal.  Retroflexion view of the proximal stomach and esophagogastric junction was performed.      FINDINGS:   Circumferential mucosal breaks within 3 mm of the EG junction consistent with erosive reflux esophagitis. No Barrett's esophagus. Stomach empty. Small hiatal hernia. Normal gastric mucosa. Patent pylorus. Normal first and second portion of the duodenum  THERAPEUTIC / DIAGNOSTIC MANEUVERS PERFORMED:  None   COMPLICATIONS:  None  IMPRESSION:   Erosive reflux esophagitis; Hiatal hernia  RECOMMENDATIONS:  Stop Prilosec; begin Dexilant 60 mg orally daily. Patient is to go by my office for free samples. See colonoscopy report.    _______________________________ R. Roetta Sessions, MD FACP [facility] eSigned:  R. Roetta Sessions, MD FACP [facility] 02/16/2012 9:37 AM     CC:

## 2012-02-16 NOTE — OR Nursing (Signed)
Ruth Burton, NP in to talk to with patient and husband and answer questions. Patient is agreeable to proceed with procedures. Consents signed.

## 2012-02-16 NOTE — OR Nursing (Signed)
Patient scheduled for EGD and TCS. Patient states that she is unsure if she wants to go through with the procedures and wants to talk with Dr. Jena Gauss first. Dr. Jena Gauss notified and will talk with patient prior to consents being signed and IV being started.

## 2012-02-16 NOTE — Op Note (Signed)
[facility] [address] [place], [address]   COLONOSCOPY PROCEDURE REPORT  PATIENT: [name], [name]  MR#:         [id number] BIRTHDATE: 24-Apr-1940 , 71  yrs. old GENDER: Female ENDOSCOPIST: R.  Roetta Sessions, MD FACP FACG REFERRED BY:  Lilyan Punt, M.D. PROCEDURE DATE:  02/16/2012 PROCEDURE:     Colonoscopy with biopsy  INDICATIONS: High risk screening colonoscopy  INFORMED CONSENT:  The risks, benefits, alternatives and imponderables including but not limited to bleeding, perforation as well as the possibility of a missed lesion have been reviewed.  The potential for biopsy, lesion removal, etc. have also been discussed.  Questions have been answered.  All parties agreeable. Please see the history and physical in the medical record for more information.  MEDICATIONS: Versed 8 mg IV and Demerol 50 mg IV in divided doses. Phenergan 12.5 mg IV and Zofran 4 mg IV  DESCRIPTION OF PROCEDURE:  After a digital rectal exam was performed, the EG-2990i ([id number]) and EC-[id number] ([id number]) colonoscope was advanced from the anus through the rectum and colon to the area of the cecum, ileocecal valve and appendiceal orifice. The cecum was deeply intubated.  These structures were well-seen and photographed for the record.  From the level of the cecum and ileocecal valve, the scope was slowly and cautiously withdrawn. The mucosal surfaces were carefully surveyed utilizing scope tip deflection to facilitate fold flattening as needed.  The scope was pulled down into the rectum where a thorough examination including retroflexion was performed.    FINDINGS:  Adequate preparation. Minimal excoriations in the distal rectum consistent with enema tip trauma; otherwise, the remainder of the rectal mucosa appeared normal.; few, shallow, left-sided diverticula; (1) diminutive polyp in the sigmoid, (1) diminutive polyp in at the splenic flexure and (1) diminutive polyp at  the hepatic flexure; remainder of the colonic mucosa appeared normal.  THERAPEUTIC / DIAGNOSTIC MANEUVERS PERFORMED:  The above-mentioned polyps were cold biopsied/removed. There was minimal bleeding with these maneuvers.  COMPLICATIONS: None  CECAL WITHDRAWAL TIME:  10 minutes  IMPRESSION:  Colonic polyps-removed as described above. Colonic diverticulosis  RECOMMENDATIONS: Followup on pathology. See EGD report.   _______________________________ eSigned:  R. Roetta Sessions, MD FACP [facility] 02/16/2012 10:05 AM   CC:    PATIENT NAME:  [name], Ruth MR#: [id number]

## 2012-02-18 ENCOUNTER — Encounter: Payer: Self-pay | Admitting: *Deleted

## 2012-02-18 ENCOUNTER — Encounter: Payer: Self-pay | Admitting: Internal Medicine

## 2012-02-19 ENCOUNTER — Telehealth: Payer: Self-pay | Admitting: *Deleted

## 2012-02-19 ENCOUNTER — Encounter (HOSPITAL_COMMUNITY): Payer: Self-pay | Admitting: Internal Medicine

## 2012-02-19 NOTE — Telephone Encounter (Signed)
I thought 6 would be plenty but go ahead and prescribe 12

## 2012-02-19 NOTE — Telephone Encounter (Signed)
Mr Pardy called this am. His wife is experiencing some pain and they believe it maybe from her recent colonoscopy. Please follow up with them.

## 2012-02-19 NOTE — Telephone Encounter (Signed)
Spoke with pts husband- pt has been experiencing bad pain on her R side, close to her hip ever since her procedure. No N/V and no fever. Pt had diarrhea the day she came home from her procedure but has not had any since. Pts husband wants to know if there is anything they can do.  Please advise.

## 2012-02-19 NOTE — Telephone Encounter (Signed)
Rx called to [place] at [facility].

## 2012-02-19 NOTE — Telephone Encounter (Signed)
pts husband is aware, if she gets worse he is going to take her to the ED.   Dr. Jena Gauss- did you want me to call in #6 or #[phone]?

## 2012-02-19 NOTE — Telephone Encounter (Signed)
Patient related some vague abdominal cramping to me yesterday when I called her on the telephone to report her biopsy results. We did not use any cautery anywhere. Took cold biopsies only of her colon. Technically, uneventful procedures .  She may be describing more musculoskeletal discomfort regarding positioning during your procedure (left lateral decubitus position) more than anything else. Glad to hear diarrhea has resolved - that's nonspecific.    Would offer her a brief course of Levsin 0.125 mg sublingual tablets-dispense #6-one sublingually before meals and at bedtime. No refills. Would anticipate resolution of her symptoms in the very near future.

## 2012-05-19 ENCOUNTER — Other Ambulatory Visit: Payer: Self-pay | Admitting: *Deleted

## 2012-05-19 MED ORDER — RANITIDINE HCL 300 MG PO TABS: 300.0000 mg | ORAL_TABLET | Freq: Every day | ORAL | Status: DC

## 2012-07-14 ENCOUNTER — Ambulatory Visit (INDEPENDENT_AMBULATORY_CARE_PROVIDER_SITE_OTHER): Payer: Self-pay | Admitting: Otolaryngology

## 2012-08-11 ENCOUNTER — Ambulatory Visit (INDEPENDENT_AMBULATORY_CARE_PROVIDER_SITE_OTHER): Payer: Medicare HMO | Admitting: Otolaryngology

## 2012-08-11 DIAGNOSIS — H6120 Impacted cerumen, unspecified ear: Secondary | ICD-10-CM

## 2012-08-11 DIAGNOSIS — H903 Sensorineural hearing loss, bilateral: Secondary | ICD-10-CM

## 2012-08-11 DIAGNOSIS — H6990 Unspecified Eustachian tube disorder, unspecified ear: Secondary | ICD-10-CM

## 2012-08-11 DIAGNOSIS — H7200 Central perforation of tympanic membrane, unspecified ear: Secondary | ICD-10-CM

## 2012-08-11 DIAGNOSIS — H6980 Other specified disorders of Eustachian tube, unspecified ear: Secondary | ICD-10-CM

## 2012-09-01 ENCOUNTER — Ambulatory Visit (INDEPENDENT_AMBULATORY_CARE_PROVIDER_SITE_OTHER): Payer: Medicare HMO | Admitting: Otolaryngology

## 2012-09-01 DIAGNOSIS — J343 Hypertrophy of nasal turbinates: Secondary | ICD-10-CM

## 2012-09-01 DIAGNOSIS — H6990 Unspecified Eustachian tube disorder, unspecified ear: Secondary | ICD-10-CM

## 2012-09-01 DIAGNOSIS — H6980 Other specified disorders of Eustachian tube, unspecified ear: Secondary | ICD-10-CM

## 2012-09-01 DIAGNOSIS — H903 Sensorineural hearing loss, bilateral: Secondary | ICD-10-CM

## 2012-09-01 DIAGNOSIS — J342 Deviated nasal septum: Secondary | ICD-10-CM

## 2012-09-22 ENCOUNTER — Telehealth: Payer: Self-pay | Admitting: Family Medicine

## 2012-09-22 MED ORDER — FLUOXETINE HCL 20 MG PO CAPS: 20.0000 mg | ORAL_CAPSULE | Freq: Every day | ORAL | Status: DC

## 2012-09-22 MED ORDER — DIAZEPAM 5 MG PO TABS: 5.0000 mg | ORAL_TABLET | Freq: Four times a day (QID) | ORAL | Status: AC | PRN

## 2012-09-22 NOTE — Telephone Encounter (Signed)
3 refills on prozac and 1 refill on valium, she needs follow up by Sept

## 2012-09-22 NOTE — Telephone Encounter (Signed)
Needs prescription refills for FLUoxetine (PROZAC) 20 MG capsule & diazepam (VALIUM) 5 MG tablet  Please call Patient. Thanks

## 2012-09-29 ENCOUNTER — Ambulatory Visit (INDEPENDENT_AMBULATORY_CARE_PROVIDER_SITE_OTHER): Payer: Medicare HMO | Admitting: Otolaryngology

## 2012-09-29 DIAGNOSIS — H7200 Central perforation of tympanic membrane, unspecified ear: Secondary | ICD-10-CM

## 2012-09-29 DIAGNOSIS — H6980 Other specified disorders of Eustachian tube, unspecified ear: Secondary | ICD-10-CM

## 2012-09-29 DIAGNOSIS — H903 Sensorineural hearing loss, bilateral: Secondary | ICD-10-CM

## 2012-12-09 ENCOUNTER — Ambulatory Visit (INDEPENDENT_AMBULATORY_CARE_PROVIDER_SITE_OTHER): Payer: Medicare HMO | Admitting: Nurse Practitioner

## 2012-12-09 ENCOUNTER — Encounter: Payer: Self-pay | Admitting: Nurse Practitioner

## 2012-12-09 VITALS — BP 140/88 | Temp 98.3°F | Ht 65.0 in | Wt 214.6 lb

## 2012-12-09 DIAGNOSIS — J310 Chronic rhinitis: Secondary | ICD-10-CM

## 2012-12-09 DIAGNOSIS — J329 Chronic sinusitis, unspecified: Secondary | ICD-10-CM

## 2012-12-09 MED ORDER — AMOXICILLIN-POT CLAVULANATE 875-125 MG PO TABS: 1.0000 | ORAL_TABLET | Freq: Two times a day (BID) | ORAL | Status: DC

## 2012-12-09 NOTE — Patient Instructions (Addendum)
BEERS criteria  Loratadine 10 mg once a day Nasonex AQ as directed

## 2012-12-11 ENCOUNTER — Encounter: Payer: Self-pay | Admitting: Nurse Practitioner

## 2012-12-11 NOTE — Progress Notes (Signed)
Subjective:  Presents complaints of sinus symptoms for the past 4 days. Frequent nonproductive cough. No fever. Some sore throat. Slight wheezing. Has never smoked. Facial area headache. No ear pain. Taking fluids well.  Objective:   BP 140/88  Temp(Src) 98.3 F (36.8 C)  Ht 5\' 5"  (1.651 m)  Wt 214 lb 9.6 oz (97.342 kg)  BMI 35.71 kg/m2 NAD. Alert, oriented. TMs clear effusion, no erythema. Pharynx mildly erythematous with PND noted. Neck supple with mild soft nontender adenopathy. Lungs clear. Heart regular rate rhythm. Nasal mucosa pale and mildly boggy.  Assessment:Rhinosinusitis  Plan: Meds ordered this encounter  Medications  . amoxicillin-clavulanate (AUGMENTIN) 875-125 MG per tablet    Sig: Take 1 tablet by mouth 2 (two) times daily.    Dispense:  20 tablet    Refill:  0    Order Specific Question:  Supervising Provider    Answer:  Merlyn Albert [[id number]]   Loratadine and Nasacort AQ as directed. Call back if symptoms worsen or persist.

## 2012-12-15 ENCOUNTER — Telehealth: Payer: Self-pay | Admitting: Family Medicine

## 2012-12-15 NOTE — Telephone Encounter (Signed)
Patient says that Augmentin is not working that she was prescribed on Friday. She states glands still swollen, drainage, and sore throat.    [employer]

## 2012-12-16 MED ORDER — CEFPROZIL 500 MG PO TABS: 500.0000 mg | ORAL_TABLET | Freq: Two times a day (BID) | ORAL | Status: DC

## 2012-12-16 NOTE — Telephone Encounter (Signed)
Rx sent electronically to [facility]. Patient notified 

## 2012-12-16 NOTE — Telephone Encounter (Signed)
Try Cefzil 501 twice a day for the next 10 days if ongoing trouble followup

## 2013-03-01 ENCOUNTER — Telehealth: Payer: Self-pay | Admitting: Family Medicine

## 2013-03-01 DIAGNOSIS — E785 Hyperlipidemia, unspecified: Secondary | ICD-10-CM

## 2013-03-01 DIAGNOSIS — R7301 Impaired fasting glucose: Secondary | ICD-10-CM

## 2013-03-01 DIAGNOSIS — Z79899 Other long term (current) drug therapy: Secondary | ICD-10-CM

## 2013-03-01 DIAGNOSIS — R5381 Other malaise: Secondary | ICD-10-CM

## 2013-03-01 DIAGNOSIS — R5383 Other fatigue: Secondary | ICD-10-CM

## 2013-03-01 NOTE — Telephone Encounter (Signed)
Sounds good, patient should do lab work with a followup visit

## 2013-03-01 NOTE — Telephone Encounter (Signed)
Notified patient that bloodwork has been ordered and can report to lab

## 2013-03-01 NOTE — Telephone Encounter (Signed)
CBC, BMP, Lipid, Liver, TSH, HgbA1C was done on 01/08/2012. Repeat the same?

## 2013-03-01 NOTE — Telephone Encounter (Signed)
Patient needs BW order °

## 2013-03-02 LAB — HEPATIC FUNCTION PANEL
ALT: 13 U/L (ref 0–35)
AST: 13 U/L (ref 0–37)
Albumin: 4.3 g/dL (ref 3.5–5.2)
Alkaline Phosphatase: 66 U/L (ref 39–117)
Bilirubin, Direct: 0.1 mg/dL (ref 0.0–0.3)
Indirect Bilirubin: 0.3 mg/dL (ref 0.0–0.9)
Total Bilirubin: 0.4 mg/dL (ref 0.3–1.2)
Total Protein: 6.7 g/dL (ref 6.0–8.3)

## 2013-03-02 LAB — CBC WITH DIFFERENTIAL/PLATELET
Basophils Absolute: 0 10*3/uL (ref 0.0–0.1)
Basophils Relative: 1 % (ref 0–1)
Eosinophils Absolute: 0.2 10*3/uL (ref 0.0–0.7)
Eosinophils Relative: 4 % (ref 0–5)
HCT: 37 % (ref 36.0–46.0)
Hemoglobin: 12.8 g/dL (ref 12.0–15.0)
Lymphocytes Relative: 41 % (ref 12–46)
Lymphs Abs: 1.6 10*3/uL (ref 0.7–4.0)
MCH: 31.2 pg (ref 26.0–34.0)
MCHC: 34.6 g/dL (ref 30.0–36.0)
MCV: 90.2 fL (ref 78.0–100.0)
Monocytes Absolute: 0.5 10*3/uL (ref 0.1–1.0)
Monocytes Relative: 11 % (ref 3–12)
Neutro Abs: 1.7 10*3/uL (ref 1.7–7.7)
Neutrophils Relative %: 43 % (ref 43–77)
Platelets: 282 10*3/uL (ref 150–400)
RBC: 4.1 MIL/uL (ref 3.87–5.11)
RDW: 14 % (ref 11.5–15.5)
WBC: 4 10*3/uL (ref 4.0–10.5)

## 2013-03-02 LAB — HEMOGLOBIN A1C
Hgb A1c MFr Bld: 5.7 % — ABNORMAL HIGH (ref <5.7)
Mean Plasma Glucose: 117 mg/dL — ABNORMAL HIGH (ref <117)

## 2013-03-02 LAB — LIPID PANEL
Cholesterol: 291 mg/dL — ABNORMAL HIGH (ref 0–200)
HDL: 47 mg/dL (ref >39)
LDL Cholesterol: 219 mg/dL — ABNORMAL HIGH (ref 0–99)
Total CHOL/HDL Ratio: 6.2 Ratio
Triglycerides: 127 mg/dL (ref <150)
VLDL: 25 mg/dL (ref 0–40)

## 2013-03-02 LAB — BASIC METABOLIC PANEL
BUN: 16 mg/dL (ref 6–23)
CO2: 29 mEq/L (ref 19–32)
Calcium: 9.4 mg/dL (ref 8.4–10.5)
Chloride: 104 mEq/L (ref 96–112)
Creat: 0.79 mg/dL (ref 0.50–1.10)
Glucose, Bld: 93 mg/dL (ref 70–99)
Potassium: 4.4 mEq/L (ref 3.5–5.3)
Sodium: 140 mEq/L (ref 135–145)

## 2013-03-02 LAB — TSH: TSH: 2.431 u[IU]/mL (ref 0.350–4.500)

## 2013-03-06 ENCOUNTER — Encounter: Payer: Self-pay | Admitting: Family Medicine

## 2013-03-06 ENCOUNTER — Ambulatory Visit (INDEPENDENT_AMBULATORY_CARE_PROVIDER_SITE_OTHER): Payer: Medicare HMO | Admitting: Family Medicine

## 2013-03-06 VITALS — BP 120/64 | Ht 65.0 in | Wt 215.0 lb

## 2013-03-06 DIAGNOSIS — E7800 Pure hypercholesterolemia, unspecified: Secondary | ICD-10-CM

## 2013-03-06 DIAGNOSIS — F32A Depression, unspecified: Secondary | ICD-10-CM

## 2013-03-06 DIAGNOSIS — F329 Major depressive disorder, single episode, unspecified: Secondary | ICD-10-CM

## 2013-03-06 DIAGNOSIS — K2100 Gastro-esophageal reflux disease with esophagitis, without bleeding: Secondary | ICD-10-CM

## 2013-03-06 DIAGNOSIS — F3289 Other specified depressive episodes: Secondary | ICD-10-CM

## 2013-03-06 MED ORDER — PANTOPRAZOLE SODIUM 40 MG PO TBEC: 40.0000 mg | DELAYED_RELEASE_TABLET | Freq: Every day | ORAL | Status: DC

## 2013-03-06 NOTE — Progress Notes (Addendum)
   Subjective:    [name], female    DOB: 08/10/1940, 73 y.o.   MRN: [id number]  Gastrophageal Reflux She complains of chest pain and choking. She reports no nausea. This is a chronic problem. The current episode started more than 1 year ago. The problem occurs frequently. The problem has been gradually worsening. Nothing aggravates the symptoms. There are no known risk factors. She has tried a PPI for the symptoms. The treatment provided moderate relief.  Headache  This is a recurrent problem. The current episode started more than 1 month ago. The problem occurs daily. The pain is located in the left unilateral region. The pain radiates to the left neck. The pain is at a severity of 8/10. The pain is moderate. Associated symptoms include neck pain. Pertinent negatives include no nausea, numbness, photophobia, visual change or vomiting. Nothing aggravates the symptoms. She has tried acetaminophen for the symptoms. The treatment provided mild relief.   Dizzy- some dizziness, lasted a few minutes. Off and on. No numbness or weakness. Patient relates this comes and goes lasts a few minutes no unilateral numbness or weakness. Does not seem to be associated with the headache.  She states under a fair amount of stress taking care of her son who has some disability problems overall patient does her best to compensate and deal with this. It should be noted that this patient does have laryngeal spasm issues where suddenly she can't do anything but make of weeks now it seems like she is choking she states it actually will go on for couple minutes will cause her upper chest her she denies any angina symptoms with activity. She states this more so is creating such symptoms that it's bothersome to her. PMH benign-hyperlipidemia, stress related issues, GERD  Review of Systems  Eyes: Negative for photophobia.  Respiratory: Positive for choking.   Cardiovascular: Positive for chest pain.    Gastrointestinal: Negative for nausea and vomiting.  Musculoskeletal: Positive for neck pain.  Neurological: Positive for headaches. Negative for numbness.       Objective:   Physical Exam Neck no masses lungs are clear no crackles heart is regular extremities no edema skin warm dry neurologic grossly normal       Assessment & Plan:  #1 gastroesophageal reflux disease-under subpar control. We discussed different measures. Dietary as well as keeping head of bed propped up. I do recommend PPI. We will try Protonix but as is usual were not sure what her insurance will cover so we may have to adjust it based on that I don't feel patient needs EGD currently. I believe she is having some laryngospasm which is quite concerning to her.  #2 depression symptoms stable with Prozac continue that  #3 hyperlipidemia patient does not tolerate statins watch diet closely stay as active as possible  #4 headaches-could be stress related could be migraine related variant. There is no red flag signs. I don't recommend scanning at this point she will keep a headache diary her neck back in within 2-3 weeks  Patient will followup again in 6 months time sooner if problems. 25-30 minutes spent with patient

## 2013-03-29 ENCOUNTER — Other Ambulatory Visit: Payer: Self-pay | Admitting: Family Medicine

## 2013-03-30 ENCOUNTER — Ambulatory Visit (INDEPENDENT_AMBULATORY_CARE_PROVIDER_SITE_OTHER): Payer: Commercial Managed Care - HMO | Admitting: Otolaryngology

## 2013-03-30 DIAGNOSIS — H6980 Other specified disorders of Eustachian tube, unspecified ear: Secondary | ICD-10-CM | POA: Diagnosis not present

## 2013-03-30 DIAGNOSIS — H6990 Unspecified Eustachian tube disorder, unspecified ear: Secondary | ICD-10-CM

## 2013-03-30 DIAGNOSIS — J0100 Acute maxillary sinusitis, unspecified: Secondary | ICD-10-CM

## 2013-03-30 DIAGNOSIS — H6120 Impacted cerumen, unspecified ear: Secondary | ICD-10-CM

## 2013-03-30 DIAGNOSIS — H7200 Central perforation of tympanic membrane, unspecified ear: Secondary | ICD-10-CM | POA: Diagnosis not present

## 2013-03-30 DIAGNOSIS — J310 Chronic rhinitis: Secondary | ICD-10-CM

## 2013-04-14 ENCOUNTER — Other Ambulatory Visit (HOSPITAL_COMMUNITY): Payer: Self-pay | Admitting: Internal Medicine

## 2013-04-14 DIAGNOSIS — R1310 Dysphagia, unspecified: Secondary | ICD-10-CM

## 2013-04-19 ENCOUNTER — Other Ambulatory Visit: Payer: Self-pay | Admitting: Family Medicine

## 2013-04-21 ENCOUNTER — Other Ambulatory Visit (HOSPITAL_COMMUNITY): Payer: Self-pay

## 2013-04-28 DIAGNOSIS — Z9889 Other specified postprocedural states: Secondary | ICD-10-CM | POA: Insufficient documentation

## 2013-05-05 ENCOUNTER — Ambulatory Visit (HOSPITAL_COMMUNITY)
Admission: RE | Admit: 2013-05-05 | Discharge: 2013-05-05 | Disposition: A | Discharge status: Home / Self Care | Payer: Medicare HMO | Source: Ambulatory Visit | Attending: Internal Medicine | Admitting: Internal Medicine

## 2013-05-05 ENCOUNTER — Other Ambulatory Visit (HOSPITAL_COMMUNITY): Payer: Self-pay | Admitting: Internal Medicine

## 2013-05-05 DIAGNOSIS — Z9889 Other specified postprocedural states: Secondary | ICD-10-CM

## 2013-05-05 DIAGNOSIS — R1310 Dysphagia, unspecified: Secondary | ICD-10-CM

## 2013-05-05 DIAGNOSIS — R079 Chest pain, unspecified: Secondary | ICD-10-CM

## 2013-05-05 DIAGNOSIS — R0602 Shortness of breath: Secondary | ICD-10-CM

## 2013-05-05 DIAGNOSIS — K449 Diaphragmatic hernia without obstruction or gangrene: Secondary | ICD-10-CM | POA: Insufficient documentation

## 2013-05-09 ENCOUNTER — Ambulatory Visit (HOSPITAL_COMMUNITY): Payer: Medicare HMO

## 2013-05-11 ENCOUNTER — Other Ambulatory Visit (HOSPITAL_COMMUNITY): Payer: Self-pay

## 2013-05-25 ENCOUNTER — Other Ambulatory Visit (HOSPITAL_COMMUNITY): Payer: Self-pay | Admitting: Internal Medicine

## 2013-05-25 DIAGNOSIS — Z9889 Other specified postprocedural states: Secondary | ICD-10-CM

## 2013-05-25 DIAGNOSIS — R0602 Shortness of breath: Secondary | ICD-10-CM

## 2013-05-25 DIAGNOSIS — R079 Chest pain, unspecified: Secondary | ICD-10-CM

## 2013-05-26 ENCOUNTER — Ambulatory Visit (HOSPITAL_COMMUNITY): Payer: Medicare HMO

## 2013-06-02 ENCOUNTER — Ambulatory Visit (HOSPITAL_COMMUNITY): Payer: Medicare HMO

## 2013-06-09 ENCOUNTER — Other Ambulatory Visit: Payer: Self-pay | Admitting: Orthopaedic Surgery

## 2013-06-09 DIAGNOSIS — M545 Low back pain, unspecified: Secondary | ICD-10-CM

## 2013-06-17 ENCOUNTER — Ambulatory Visit
Admission: RE | Admit: 2013-06-17 | Discharge: 2013-06-17 | Disposition: A | Discharge status: Home / Self Care | Payer: Medicare HMO | Source: Ambulatory Visit | Attending: Orthopaedic Surgery | Admitting: Orthopaedic Surgery

## 2013-06-17 DIAGNOSIS — M545 Low back pain, unspecified: Secondary | ICD-10-CM

## 2013-08-24 ENCOUNTER — Ambulatory Visit (HOSPITAL_COMMUNITY)
Admission: RE | Admit: 2013-08-24 | Discharge: 2013-08-24 | Disposition: A | Discharge status: Home / Self Care | Payer: Medicare HMO | Source: Ambulatory Visit | Attending: Family Medicine | Admitting: Family Medicine

## 2013-08-24 ENCOUNTER — Encounter (INDEPENDENT_AMBULATORY_CARE_PROVIDER_SITE_OTHER): Payer: Self-pay

## 2013-08-24 ENCOUNTER — Other Ambulatory Visit (HOSPITAL_COMMUNITY): Payer: Self-pay | Admitting: Family Medicine

## 2013-08-24 DIAGNOSIS — M25511 Pain in right shoulder: Secondary | ICD-10-CM

## 2013-08-24 DIAGNOSIS — M25519 Pain in unspecified shoulder: Secondary | ICD-10-CM | POA: Diagnosis present

## 2013-08-24 DIAGNOSIS — M19019 Primary osteoarthritis, unspecified shoulder: Secondary | ICD-10-CM | POA: Insufficient documentation

## 2013-10-05 ENCOUNTER — Other Ambulatory Visit: Payer: Self-pay | Admitting: Family Medicine

## 2014-01-04 ENCOUNTER — Ambulatory Visit (INDEPENDENT_AMBULATORY_CARE_PROVIDER_SITE_OTHER): Payer: Self-pay | Admitting: Otolaryngology

## 2014-01-29 ENCOUNTER — Telehealth: Payer: Self-pay | Admitting: Family Medicine

## 2014-01-29 NOTE — Telephone Encounter (Signed)
Patient released her records back in February and with protocol, we do not take patients back after records being released.  She is having shoulder issues again and wants to be seen here.  She said that Dr. Nicki Reaper told her husband that we could take her back.  Just getting clarification on this?

## 2014-01-29 NOTE — Telephone Encounter (Signed)
Dr Nicki Reaper will take patient back- per Dr Nicki Reaper

## 2014-02-15 ENCOUNTER — Encounter: Payer: Self-pay | Admitting: Family Medicine

## 2014-02-15 ENCOUNTER — Ambulatory Visit (INDEPENDENT_AMBULATORY_CARE_PROVIDER_SITE_OTHER): Payer: Commercial Managed Care - HMO | Admitting: Family Medicine

## 2014-02-15 VITALS — BP 112/72 | Ht 65.0 in | Wt 213.0 lb

## 2014-02-15 DIAGNOSIS — E785 Hyperlipidemia, unspecified: Secondary | ICD-10-CM

## 2014-02-15 DIAGNOSIS — Z23 Encounter for immunization: Secondary | ICD-10-CM

## 2014-02-15 DIAGNOSIS — M8580 Other specified disorders of bone density and structure, unspecified site: Secondary | ICD-10-CM

## 2014-02-15 DIAGNOSIS — G4733 Obstructive sleep apnea (adult) (pediatric): Secondary | ICD-10-CM

## 2014-02-15 DIAGNOSIS — Z79899 Other long term (current) drug therapy: Secondary | ICD-10-CM

## 2014-02-15 DIAGNOSIS — M1732 Unilateral post-traumatic osteoarthritis, left knee: Secondary | ICD-10-CM

## 2014-02-15 DIAGNOSIS — R739 Hyperglycemia, unspecified: Secondary | ICD-10-CM

## 2014-02-15 DIAGNOSIS — R5383 Other fatigue: Secondary | ICD-10-CM

## 2014-02-15 NOTE — Progress Notes (Addendum)
   Subjective:    [name], female    DOB: Aug 28, 1940, 73 y.o.   MRN: [id number]  HPITear in both shoulders. Pt saw her ortho doctor. They did bloodwork and she will follow up with him next weeks.   Tremor in hands, arms and legs.  Pt states its from the prozac. Taking prozac for fibromyalgia pain.   Long discussion held regarding her health. The importance of exercise losing weight. The struggles she is had with her knee pain and discomfort. Plus also troubles she is had with shoulder pain this is well  Review of Systems    she relates shoulder pain knee pain back pain relates fibromyalgia relates no chest tightness pressure pain or shortness of breath Objective:   Physical Exam   her lungs clear hearts regular subjective tenderness in both shoulders worse on the right than the left osteoarthritis noted in the left knee right knee no osteoarthritis extremities no edema       Assessment & Plan:  Right shoulder pain-she is to follow-up with orthopedics do some exercises hopefully this would get better maintained up needing surgery  Left knee osteoarthritis when she gets to the point of having surgery she will need medical and cardiac preoperative clearance  Minimal tremor in the hands I believe this is benign essential tremor I don't find evidence of Parkinson's  She consents to a pneumonia vaccine she refuses flu vaccine  25 minutes spent with patient today covering multiple issues greater than half in consultation  This patient has history of previous diabetes we'll check an A1c  Has history of hyperlipidemia E heart healthy diet check lipid file  Issues also be noted that the patient did complain of fatigue tiredness daytime sleepiness as well as snoring at nighttime. Her spouse relates that she snores and at times has positive center breathing as well as daytime fatigue and somnolence. Therefore we will order sleep study.

## 2014-02-20 ENCOUNTER — Encounter: Payer: Self-pay | Admitting: Family Medicine

## 2014-02-20 ENCOUNTER — Ambulatory Visit (INDEPENDENT_AMBULATORY_CARE_PROVIDER_SITE_OTHER): Payer: PPO | Admitting: Family Medicine

## 2014-02-20 ENCOUNTER — Encounter: Payer: Self-pay | Admitting: *Deleted

## 2014-02-20 VITALS — BP 120/78 | Temp 99.5°F | Ht 65.0 in | Wt 213.0 lb

## 2014-02-20 DIAGNOSIS — J019 Acute sinusitis, unspecified: Secondary | ICD-10-CM

## 2014-02-20 DIAGNOSIS — B9689 Other specified bacterial agents as the cause of diseases classified elsewhere: Secondary | ICD-10-CM

## 2014-02-20 MED ORDER — BENZONATATE 100 MG PO CAPS: 100.0000 mg | ORAL_CAPSULE | Freq: Three times a day (TID) | ORAL | Status: DC | PRN

## 2014-02-20 MED ORDER — CEFPROZIL 500 MG PO TABS: 500.0000 mg | ORAL_TABLET | Freq: Two times a day (BID) | ORAL | Status: DC

## 2014-02-20 NOTE — Progress Notes (Signed)
   Subjective:    [name], female    DOB: 11/08/1940, 74 y.o.   MRN: [id number]  Cough This is a new problem. The current episode started in the past 7 days. Associated symptoms include a fever and headaches. Associated symptoms comments: Runny nose - clear. Treatments tried: nyquil, dayquil.   Patient states started off with upper rest voice symptoms then progressed into congestion coughing. Denies high fever or chills states has low energy level   Review of Systems  Constitutional: Positive for fever.  Respiratory: Positive for cough.   Neurological: Positive for headaches.       Objective:   Physical Exam Moderate sinus tenderness eardrums normal throat is normal neck is supple lungs are clear no crackles or restaurant difficulty heart regular pulse normal vital signs noted       Assessment & Plan:  Viral syndrome Acute bronchitis Acute bacterial sinusitis Antibiotics prescribed Warning signs discussed. Follow-up if ongoing troubles or if worse.

## 2014-02-22 ENCOUNTER — Telehealth: Payer: Self-pay | Admitting: Family Medicine

## 2014-02-22 MED ORDER — ALBUTEROL SULFATE HFA 108 (90 BASE) MCG/ACT IN AERS: 2.0000 | INHALATION_SPRAY | Freq: Four times a day (QID) | RESPIRATORY_TRACT | Status: DC | PRN

## 2014-02-22 NOTE — Telephone Encounter (Signed)
Ventolin mdi two spray qid prn wheeze

## 2014-02-22 NOTE — Telephone Encounter (Signed)
Pt's husband states that the pt is wheezing a lot and coughed All nigh and wants to know If an inhaler can be called in or if she needs to be seen.  Pt has low grade Fever of 99.8   [place]

## 2014-02-22 NOTE — Telephone Encounter (Signed)
Pt was seen on Tuesday by Dr. Nicki Reaper. DX with acute bacterial sinusitis. Had low grade fever. Pt given cefzil and tessalon.

## 2014-02-22 NOTE — Telephone Encounter (Signed)
Patient's husband notified  

## 2014-02-28 ENCOUNTER — Other Ambulatory Visit (HOSPITAL_COMMUNITY): Payer: Self-pay

## 2014-03-01 ENCOUNTER — Telehealth: Payer: Self-pay | Admitting: Family Medicine

## 2014-03-01 NOTE — Telephone Encounter (Signed)
NTC this could be a sign of blood in stool. No NSAID, if having profuse issue then NTBS today if not in am with Hoyle Sauer. Stop cefzil

## 2014-03-01 NOTE — Telephone Encounter (Signed)
Pt states she has 2 doses left of the cefPROZIL (CEFZIL) 500 MG tablet, states she's having multiple stools daily since being on med, very loose and a looks like medium colored coffee grounds. Patient states there's been no pain or discomfort, no bloating, no fever Please advise [place]

## 2014-03-01 NOTE — Telephone Encounter (Signed)
Patient stated she is in no pain or distress at this time and scheduled office visit in the am with Hoyle Sauer.

## 2014-03-02 ENCOUNTER — Ambulatory Visit (INDEPENDENT_AMBULATORY_CARE_PROVIDER_SITE_OTHER): Payer: PPO | Admitting: Nurse Practitioner

## 2014-03-02 ENCOUNTER — Encounter: Payer: Self-pay | Admitting: Nurse Practitioner

## 2014-03-02 VITALS — BP 136/82 | Temp 97.6°F | Ht 65.0 in | Wt 215.0 lb

## 2014-03-02 DIAGNOSIS — T3695XA Adverse effect of unspecified systemic antibiotic, initial encounter: Principal | ICD-10-CM

## 2014-03-02 DIAGNOSIS — K529 Noninfective gastroenteritis and colitis, unspecified: Secondary | ICD-10-CM

## 2014-03-02 DIAGNOSIS — K521 Toxic gastroenteritis and colitis: Secondary | ICD-10-CM

## 2014-03-02 DIAGNOSIS — R21 Rash and other nonspecific skin eruption: Secondary | ICD-10-CM

## 2014-03-02 MED ORDER — TRIAMCINOLONE ACETONIDE 0.1 % EX CREA: 1.0000 "application " | TOPICAL_CREAM | Freq: Two times a day (BID) | CUTANEOUS | Status: DC

## 2014-03-04 ENCOUNTER — Encounter: Payer: Self-pay | Admitting: Nurse Practitioner

## 2014-03-04 NOTE — Progress Notes (Signed)
Subjective:  Presents for c/o watery stool with "coffee ground" looking material x 5 d. Began after starting Cefzil for recent sinusitis. Had several times yesterday. None today. No fever. No abdominal pain. No rectal pain. Taking probiotic twice per day that she bought at [facility]. This seems to be helping. Has had a normal colonoscopy.  Also rash with itching localized to lower abdominal area. Scratched during sleep last night. No known contacts. Has not taken Pepto Bismol or other products that would cause change in stool color. No obvious blood.  Objective:   BP 136/82 mmHg  Temp(Src) 97.6 F (36.4 C)  Ht 5\' 5"  (1.651 m)  Wt 215 lb (97.523 kg)  BMI 35.78 kg/m2 NAD. Alert, oriented. Lungs clear. Heart RRR. Multiple discrete papules with excoriation noted along lower abdomen. Abdomen soft, non distended non tender. Rectal exam: no masses; no stool for hemoccult. Had colonoscopy 02/16/12.   Assessment: Antibiotic-associated diarrhea - Plan: Clostridium Difficile by PCR  Rash and nonspecific skin eruption  Plan:  Meds ordered this encounter  Medications  . triamcinolone cream (KENALOG) 0.1 %    Sig: Apply 1 application topically 2 (two) times daily. Prn rash; use up to 2 weeks    Dispense:  30 g    Refill:  0    Order Specific Question:  Supervising Provider    Answer:  Mikey Kirschner [[id number]]   Continue probiotics as directed; recommend Activia or Align. If no improvement over next 48-72 hours, patient to obtain sample for C diff and drop off at hospital. Warning signs reviewed. Call back next week if no better. Go to ED if worse.

## 2014-03-05 ENCOUNTER — Telehealth: Payer: Self-pay | Admitting: Family Medicine

## 2014-03-05 NOTE — Telephone Encounter (Signed)
Referral was placed for a sleep study.  However there's nothing (except for the diagnosis code for sleep apnea) noted in OV note 02/15/14.  I have to submit request for possible prior authorization due to her insurance and need clinical information to submit.  Please advise.

## 2014-03-06 ENCOUNTER — Ambulatory Visit (HOSPITAL_COMMUNITY)
Admission: RE | Admit: 2014-03-06 | Discharge: 2014-03-06 | Disposition: A | Discharge status: Home / Self Care | Payer: PPO | Source: Ambulatory Visit | Attending: Family Medicine | Admitting: Family Medicine

## 2014-03-06 ENCOUNTER — Encounter: Payer: Self-pay | Admitting: Family Medicine

## 2014-03-06 DIAGNOSIS — M8580 Other specified disorders of bone density and structure, unspecified site: Secondary | ICD-10-CM | POA: Insufficient documentation

## 2014-03-06 DIAGNOSIS — Z780 Asymptomatic menopausal state: Secondary | ICD-10-CM | POA: Diagnosis not present

## 2014-03-06 NOTE — Telephone Encounter (Signed)
I will put addendum into the 12/31 note.

## 2014-03-15 ENCOUNTER — Ambulatory Visit (INDEPENDENT_AMBULATORY_CARE_PROVIDER_SITE_OTHER): Payer: PPO | Admitting: Nurse Practitioner

## 2014-03-15 ENCOUNTER — Encounter: Payer: Self-pay | Admitting: Nurse Practitioner

## 2014-03-15 VITALS — BP 130/82 | Temp 97.5°F | Ht 65.0 in | Wt 217.8 lb

## 2014-03-15 DIAGNOSIS — R0600 Dyspnea, unspecified: Secondary | ICD-10-CM

## 2014-03-15 MED ORDER — FLUTICASONE PROPIONATE HFA 110 MCG/ACT IN AERO: INHALATION_SPRAY | RESPIRATORY_TRACT | Status: DC

## 2014-03-15 NOTE — Progress Notes (Signed)
BW order placed for Vit D level. Pt notified and verbalized understanding.

## 2014-03-15 NOTE — Addendum Note (Signed)
Addended byCharolotte Capuchin D on: 03/15/2014 09:20 AM   Modules accepted: Orders

## 2014-03-16 ENCOUNTER — Other Ambulatory Visit (HOSPITAL_COMMUNITY): Payer: Self-pay | Admitting: Respiratory Therapy

## 2014-03-16 DIAGNOSIS — G4730 Sleep apnea, unspecified: Secondary | ICD-10-CM

## 2014-03-18 ENCOUNTER — Encounter: Payer: Self-pay | Admitting: Nurse Practitioner

## 2014-03-18 ENCOUNTER — Encounter: Payer: Self-pay | Admitting: Family Medicine

## 2014-03-18 DIAGNOSIS — R7303 Prediabetes: Secondary | ICD-10-CM | POA: Insufficient documentation

## 2014-03-18 DIAGNOSIS — E785 Hyperlipidemia, unspecified: Secondary | ICD-10-CM | POA: Insufficient documentation

## 2014-03-18 LAB — CBC WITH DIFFERENTIAL/PLATELET
Basophils Absolute: 0 10*3/uL (ref 0.0–0.1)
Basophils Relative: 1 % (ref 0–1)
Eosinophils Absolute: 0.3 10*3/uL (ref 0.0–0.7)
Eosinophils Relative: 6 % — ABNORMAL HIGH (ref 0–5)
HCT: 37.6 % (ref 36.0–46.0)
Hemoglobin: 12.3 g/dL (ref 12.0–15.0)
Lymphocytes Relative: 40 % (ref 12–46)
Lymphs Abs: 1.8 10*3/uL (ref 0.7–4.0)
MCH: 30.4 pg (ref 26.0–34.0)
MCHC: 32.7 g/dL (ref 30.0–36.0)
MCV: 93.1 fL (ref 78.0–100.0)
MPV: 8.9 fL (ref 8.6–12.4)
Monocytes Absolute: 0.6 10*3/uL (ref 0.1–1.0)
Monocytes Relative: 13 % — ABNORMAL HIGH (ref 3–12)
Neutro Abs: 1.8 10*3/uL (ref 1.7–7.7)
Neutrophils Relative %: 40 % — ABNORMAL LOW (ref 43–77)
Platelets: 271 10*3/uL (ref 150–400)
RBC: 4.04 MIL/uL (ref 3.87–5.11)
RDW: 14.5 % (ref 11.5–15.5)
WBC: 4.4 10*3/uL (ref 4.0–10.5)

## 2014-03-18 LAB — BASIC METABOLIC PANEL
BUN: 12 mg/dL (ref 6–23)
CO2: 28 mEq/L (ref 19–32)
Calcium: 9.5 mg/dL (ref 8.4–10.5)
Chloride: 101 mEq/L (ref 96–112)
Creat: 0.65 mg/dL (ref 0.50–1.10)
Glucose, Bld: 88 mg/dL (ref 70–99)
Potassium: 4.5 mEq/L (ref 3.5–5.3)
Sodium: 137 mEq/L (ref 135–145)

## 2014-03-18 LAB — CLOSTRIDIUM DIFFICILE BY PCR: Toxigenic C. Difficile by PCR: NOT DETECTED

## 2014-03-18 LAB — LIPID PANEL
Cholesterol: 258 mg/dL — ABNORMAL HIGH (ref 0–200)
HDL: 45 mg/dL (ref >39)
LDL Cholesterol: 183 mg/dL — ABNORMAL HIGH (ref 0–99)
Total CHOL/HDL Ratio: 5.7 Ratio
Triglycerides: 151 mg/dL — ABNORMAL HIGH (ref <150)
VLDL: 30 mg/dL (ref 0–40)

## 2014-03-18 LAB — HEPATIC FUNCTION PANEL
ALT: 14 U/L (ref 0–35)
AST: 15 U/L (ref 0–37)
Albumin: 4.1 g/dL (ref 3.5–5.2)
Alkaline Phosphatase: 63 U/L (ref 39–117)
Bilirubin, Direct: 0.1 mg/dL (ref 0.0–0.3)
Indirect Bilirubin: 0.4 mg/dL (ref 0.2–1.2)
Total Bilirubin: 0.5 mg/dL (ref 0.2–1.2)
Total Protein: 6.3 g/dL (ref 6.0–8.3)

## 2014-03-18 LAB — HEMOGLOBIN A1C
Hgb A1c MFr Bld: 5.9 % — ABNORMAL HIGH (ref <5.7)
Mean Plasma Glucose: 123 mg/dL — ABNORMAL HIGH (ref <117)

## 2014-03-18 NOTE — Progress Notes (Signed)
Subjective:  Presents for c/o "trouble taking a deep breath". No fever. "Bad" cough non productive. SOB x 2 d. No wheezing. No history of cardiac problems. No relief with Mucinex. PND. Occasional sore throat. Occasional ear pain. No edema or orthopnea. Slight chest tightness especially with deep breath. Diarrhea improved but still having 5 soft stools per day. Taking bowel probiotic. Treated for sinusitis on 1/5. Last saw cardiologist in 2011. Hesitant about using albuterol due to side effects that she heard about.  Objective:   BP 130/82 mmHg  Temp(Src) 97.5 F (36.4 C) (Oral)  Ht 5\' 5"  (1.651 m)  Wt 217 lb 12.8 oz (98.793 kg)  BMI 36.24 kg/m2  SpO2 97% NAD. Alert, oriented. TMs minimal clear effusion. Pharynx clear. Neck supple with mild anterior adenopathy. Lungs rare faint expiratory wheeze. Heart RRR. EKG normal.    Assessment: Dyspnea - Plan: PR ELECTROCARDIOGRAM, COMPLETE, CANCELED: Clostridium Difficile by PCR    Plan:  Meds ordered this encounter  Medications  . fluticasone (FLOVENT HFA) 110 MCG/ACT inhaler    Sig: One puff BID for prevention of wheezing    Dispense:  1 Inhaler    Refill:  2    Order Specific Question:  Supervising Provider    Answer:  Mikey Kirschner [[id number]]   Will avoid oral steroids due to history of hyperglycemia. Add flovent to regimen. Use albuterol as directed. Reviewed most common side effects. Call back next week if no improvement, call or go to ED sooner if worse. Encouraged patient to get stool culture for C diff.

## 2014-03-19 ENCOUNTER — Other Ambulatory Visit: Payer: Self-pay | Admitting: Nurse Practitioner

## 2014-03-19 ENCOUNTER — Telehealth: Payer: Self-pay | Admitting: Nurse Practitioner

## 2014-03-19 DIAGNOSIS — R059 Cough, unspecified: Secondary | ICD-10-CM

## 2014-03-19 DIAGNOSIS — R062 Wheezing: Secondary | ICD-10-CM

## 2014-03-19 DIAGNOSIS — R05 Cough: Secondary | ICD-10-CM

## 2014-03-19 LAB — VITAMIN D 25 HYDROXY (VIT D DEFICIENCY, FRACTURES): Vit D, 25-Hydroxy: 25 ng/mL — ABNORMAL LOW (ref 30–100)

## 2014-03-19 NOTE — Telephone Encounter (Signed)
Discussed with pt. She states she always have reflux but it is worse at night since starting flovent. Having burning in throat. Pt states she will go over for xray.

## 2014-03-19 NOTE — Telephone Encounter (Signed)
Rinse out mouth or brush teeth after using Flovent

## 2014-03-19 NOTE — Telephone Encounter (Signed)
Pt seen twice in last month for cough, she states she is not better an still  Has her wheezy cough. She states that you wanted to run a xray if she  Was not better by today.    Please let her know when an where for the chest xray

## 2014-03-19 NOTE — Telephone Encounter (Signed)
It may take several days for Flovent to work. We are trying to avoid oral steroids since this will raise her BS. Also please check to see if she is having GERD symptoms. Xray has been ordered. Can go whenever is good for her.

## 2014-03-20 ENCOUNTER — Ambulatory Visit (HOSPITAL_COMMUNITY)
Admission: RE | Admit: 2014-03-20 | Discharge: 2014-03-20 | Disposition: A | Discharge status: Home / Self Care | Payer: PPO | Source: Ambulatory Visit | Attending: Nurse Practitioner | Admitting: Nurse Practitioner

## 2014-03-20 DIAGNOSIS — J45909 Unspecified asthma, uncomplicated: Secondary | ICD-10-CM | POA: Diagnosis not present

## 2014-03-20 DIAGNOSIS — R05 Cough: Secondary | ICD-10-CM | POA: Insufficient documentation

## 2014-03-21 ENCOUNTER — Other Ambulatory Visit: Payer: Self-pay | Admitting: *Deleted

## 2014-03-21 ENCOUNTER — Encounter: Payer: Self-pay | Admitting: *Deleted

## 2014-03-21 NOTE — Progress Notes (Signed)
Patient notified and verbalized understanding. 

## 2014-03-21 NOTE — Telephone Encounter (Signed)
Pt notified and verbalized understanding. She will call back if s/s worsen.

## 2014-03-23 ENCOUNTER — Encounter: Payer: Self-pay | Admitting: Family Medicine

## 2014-03-26 ENCOUNTER — Ambulatory Visit (INDEPENDENT_AMBULATORY_CARE_PROVIDER_SITE_OTHER): Payer: PPO | Admitting: Family Medicine

## 2014-03-26 ENCOUNTER — Encounter: Payer: Self-pay | Admitting: Family Medicine

## 2014-03-26 VITALS — BP 118/76 | Temp 98.3°F | Ht 65.0 in | Wt 218.0 lb

## 2014-03-26 DIAGNOSIS — J200 Acute bronchitis due to Mycoplasma pneumoniae: Secondary | ICD-10-CM

## 2014-03-26 MED ORDER — AZITHROMYCIN 250 MG PO TABS: ORAL_TABLET | ORAL | Status: DC

## 2014-03-26 NOTE — Progress Notes (Signed)
   Subjective:    [name], female    DOB: August 31, 1940, 74 y.o.   MRN: [id number]  HPI Comments: Had chest xray done   Cough This is a new problem. Episode onset: seen here on 03/15/14. The problem has been unchanged. The cough is non-productive. Associated symptoms include ear pain, rhinorrhea and wheezing. Pertinent negatives include no chest pain, fever or shortness of breath. Associated symptoms comments: Burning in chest, comes and goes. Nothing aggravates the symptoms. She has tried OTC cough suppressant (inhhalers) for the symptoms. The treatment provided mild relief.    Previous notes were reviewed.  Review of Systems  Constitutional: Negative for fever and activity change.  HENT: Positive for congestion, ear pain and rhinorrhea.   Eyes: Negative for discharge.  Respiratory: Positive for cough and wheezing. Negative for shortness of breath.   Cardiovascular: Negative for chest pain.       Objective:   Physical Exam  Constitutional: She appears well-developed.  HENT:  Head: Normocephalic.  Nose: Nose normal.  Mouth/Throat: Oropharynx is clear and moist. No oropharyngeal exudate.  Neck: Neck supple.  Cardiovascular: Normal rate and normal heart sounds.   No murmur heard. Pulmonary/Chest: Effort normal and breath sounds normal. She has no wheezes.  Lymphadenopathy:    She has no cervical adenopathy.  Skin: Skin is warm and dry.  Nursing note and vitals reviewed.         Assessment & Plan:  Secondary bronchitis-she had underlying viral illness that triggered some prolonged bronchitis because of this she needs to be on some atypical antibiotics. Continue the Flovent for a total 4 weeks albuterol on a when necessary basis warning signs discussed no need for x-rays or lab work currently

## 2014-03-26 NOTE — Patient Instructions (Signed)
Flovent -use 2 puffs twice a day ( separate the puffs by one minute) rinse after use  Albuterol may be used 2 puffs every 4 hours as needed for wheezing or shortness of breath  You should see gradual improvement over next week and it may take a few more weeks to totally resolve,please call if problems      How to Use an Inhaler Proper inhaler technique is very important. Good technique ensures that the medicine reaches the lungs. Poor technique results in depositing the medicine on the tongue and back of the throat rather than in the airways. If you do not use the inhaler with good technique, the medicine will not help you. STEPS TO FOLLOW IF USING AN INHALER WITHOUT AN EXTENSION TUBE 1. Remove the cap from the inhaler. 2. If you are using the inhaler for the first time, you will need to prime it. Shake the inhaler for 5 seconds and release four puffs into the air, away from your face. Ask your health care provider or pharmacist if you have questions about priming your inhaler. 3. Shake the inhaler for 5 seconds before each breath in (inhalation). 4. Position the inhaler so that the top of the canister faces up. 5. Put your index finger on the top of the medicine canister. Your thumb supports the bottom of the inhaler. 6. Open your mouth. 7. Either place the inhaler between your teeth and place your lips tightly around the mouthpiece, or hold the inhaler 1-2 inches away from your open mouth. If you are unsure of which technique to use, ask your health care provider. 8. Breathe out (exhale) normally and as completely as possible. 9. Press the canister down with your index finger to release the medicine. 10. At the same time as the canister is pressed, inhale deeply and slowly until your lungs are completely filled. This should take 4-6 seconds. Keep your tongue down. 11. Hold the medicine in your lungs for 5-10 seconds (10 seconds is best). This helps the medicine get into the small airways  of your lungs. 12. Breathe out slowly, through pursed lips. Whistling is an example of pursed lips. 13. Wait at least 15-30 seconds between puffs. Continue with the above steps until you have taken the number of puffs your health care provider has ordered. Do not use the inhaler more than your health care provider tells you. 14. Replace the cap on the inhaler. 15. Follow the directions from your health care provider or the inhaler insert for cleaning the inhaler. STEPS TO FOLLOW IF USING AN INHALER WITH AN EXTENSION (SPACER) 1. Remove the cap from the inhaler. 2. If you are using the inhaler for the first time, you will need to prime it. Shake the inhaler for 5 seconds and release four puffs into the air, away from your face. Ask your health care provider or pharmacist if you have questions about priming your inhaler. 3. Shake the inhaler for 5 seconds before each breath in (inhalation). 4. Place the open end of the spacer onto the mouthpiece of the inhaler. 5. Position the inhaler so that the top of the canister faces up and the spacer mouthpiece faces you. 6. Put your index finger on the top of the medicine canister. Your thumb supports the bottom of the inhaler and the spacer. 7. Breathe out (exhale) normally and as completely as possible. 8. Immediately after exhaling, place the spacer between your teeth and into your mouth. Close your lips tightly around the spacer. 9.  Press the canister down with your index finger to release the medicine. 10. At the same time as the canister is pressed, inhale deeply and slowly until your lungs are completely filled. This should take 4-6 seconds. Keep your tongue down and out of the way. 11. Hold the medicine in your lungs for 5-10 seconds (10 seconds is best). This helps the medicine get into the small airways of your lungs. Exhale. 12. Repeat inhaling deeply through the spacer mouthpiece. Again hold that breath for up to 10 seconds (10 seconds is best).  Exhale slowly. If it is difficult to take this second deep breath through the spacer, breathe normally several times through the spacer. Remove the spacer from your mouth. 13. Wait at least 15-30 seconds between puffs. Continue with the above steps until you have taken the number of puffs your health care provider has ordered. Do not use the inhaler more than your health care provider tells you. 14. Remove the spacer from the inhaler, and place the cap on the inhaler. 15. Follow the directions from your health care provider or the inhaler insert for cleaning the inhaler and spacer. If you are using different kinds of inhalers, use your quick relief medicine to open the airways 10-15 minutes before using a steroid if instructed to do so by your health care provider. If you are unsure which inhalers to use and the order of using them, ask your health care provider, nurse, or respiratory therapist. If you are using a steroid inhaler, always rinse your mouth with water after your last puff, then gargle and spit out the water. Do not swallow the water. AVOID:  Inhaling before or after starting the spray of medicine. It takes practice to coordinate your breathing with triggering the spray.  Inhaling through the nose (rather than the mouth) when triggering the spray. HOW TO DETERMINE IF YOUR INHALER IS FULL OR NEARLY EMPTY You cannot know when an inhaler is empty by shaking it. A few inhalers are now being made with dose counters. Ask your health care provider for a prescription that has a dose counter if you feel you need that extra help. If your inhaler does not have a counter, ask your health care provider to help you determine the date you need to refill your inhaler. Write the refill date on a calendar or your inhaler canister. Refill your inhaler 7-10 days before it runs out. Be sure to keep an adequate supply of medicine. This includes making sure it is not expired, and that you have a spare inhaler.    SEEK MEDICAL CARE IF:   Your symptoms are only partially relieved with your inhaler.  You are having trouble using your inhaler.  You have some increase in phlegm. SEEK IMMEDIATE MEDICAL CARE IF:   You feel little or no relief with your inhalers. You are still wheezing and are feeling shortness of breath or tightness in your chest or both.  You have dizziness, headaches, or a fast heart rate.  You have chills, fever, or night sweats.  You have a noticeable increase in phlegm production, or there is blood in the phlegm. MAKE SURE YOU:   Understand these instructions.  Will watch your condition.  Will get help right away if you are not doing well or get worse. Document Released: 01/31/2000 Document Revised: 11/23/2012 Document Reviewed: 09/01/2012 [facility] Patient Information 2015 [place], [place]. This information is not intended to replace advice given to you by your health care provider. Make sure you  discuss any questions you have with your health care provider.  

## 2014-04-05 ENCOUNTER — Telehealth: Payer: Self-pay | Admitting: *Deleted

## 2014-04-05 ENCOUNTER — Ambulatory Visit (INDEPENDENT_AMBULATORY_CARE_PROVIDER_SITE_OTHER): Payer: Self-pay | Admitting: Internal Medicine

## 2014-04-05 MED ORDER — ONDANSETRON 8 MG PO TBDP: 8.0000 mg | ORAL_TABLET | Freq: Three times a day (TID) | ORAL | Status: DC | PRN

## 2014-04-05 NOTE — Telephone Encounter (Signed)
Pt is having vomiting and diarrhea. S/s started today at noon. No fever, no bloody stools. Per Dr Nicki Reaper, will send in Zofran and for pt to take Imodium after the nausea is under control. Bland diet. Small, frequent sips. Call back if s/s persist or worsen. Pt verbalized understanding.

## 2014-04-12 ENCOUNTER — Ambulatory Visit: Payer: PPO | Attending: Family Medicine | Admitting: Sleep Medicine

## 2014-04-12 DIAGNOSIS — G4730 Sleep apnea, unspecified: Secondary | ICD-10-CM

## 2014-04-12 DIAGNOSIS — R0683 Snoring: Secondary | ICD-10-CM | POA: Insufficient documentation

## 2014-04-12 DIAGNOSIS — R5383 Other fatigue: Secondary | ICD-10-CM | POA: Diagnosis not present

## 2014-04-12 DIAGNOSIS — G4710 Hypersomnia, unspecified: Secondary | ICD-10-CM | POA: Diagnosis present

## 2014-04-15 NOTE — Sleep Study (Signed)
[place] A. Merlene Laughter, MD     [email or web address]        NOCTURNAL POLYSOMNOGRAM    LOCATION: SLEEP LAB FACILITY: [facility]   PHYSICIAN: Ruth [name], M.D.   DATE OF STUDY: 04/12/2014.   REFERRING PHYSICIAN: Sallee [name].   INDICATIONS: The patient is a 74 year old who presents snoring, difficulty sleeping, fatigue and hypersomnia.  MEDICATIONS:  Prior to Admission medications   Medication Sig Start Date End Date Taking? Authorizing Provider  acetaminophen (TYLENOL) 500 MG tablet Take 500 mg by mouth every 6 (six) hours as needed. For pain     Historical Provider, MD  albuterol (PROVENTIL HFA;VENTOLIN HFA) 108 (90 BASE) MCG/ACT inhaler Inhale 2 puffs into the lungs every 6 (six) hours as needed for wheezing. 02/22/14   Mikey Kirschner, MD  azithromycin (ZITHROMAX Z-PAK) 250 MG tablet Take 2 tablets (500 mg) on  Day 1,  followed by 1 tablet (250 mg) once daily on Days 2 through 5. 03/26/14   Kathyrn Drown, MD  b complex vitamins tablet Take 1 tablet by mouth daily.      Historical Provider, MD  benzonatate (TESSALON) 100 MG capsule Take 1 capsule (100 mg total) by mouth 3 (three) times daily as needed for cough. Patient not taking: Reported on 03/02/2014 02/20/14   Kathyrn Drown, MD  Cholecalciferol (VITAMIN D) 2000 UNITS CAPS Take by mouth.    Historical Provider, MD  Coenzyme Q10 (CO Q 10) 10 MG CAPS Take 1 capsule by mouth daily.      Historical Provider, MD  dicyclomine (BENTYL) 10 MG capsule Take 10 mg by mouth as needed. For stomach cramps     Historical Provider, MD  ergocalciferol (VITAMIN D2) 50000 UNITS capsule Take 1 capsule by mouth.    Historical Provider, MD  fish oil-omega-3 fatty acids 1000 MG capsule Take 2 g by mouth daily.      Historical Provider, MD  Flaxseed, Linseed, (FLAX SEED OIL) 1000 MG CAPS Take by mouth.    Historical Provider, MD  FLUoxetine (PROZAC) 20 MG capsule TAKE (1) CAPSULE BY MOUTH ONCE DAILY. 04/19/13   Kathyrn Drown, MD    fluticasone (FLOVENT HFA) 110 MCG/ACT inhaler One puff BID for prevention of wheezing 03/15/14   Nilda Simmer, NP  HYDROcodone-acetaminophen (NORCO/VICODIN) 5-325 MG per tablet Take 1 tablet by mouth.    Historical Provider, MD  hyoscyamine (ANASPAZ) 0.125 MG TBDP disintergrating tablet Take 0.125 mg by mouth.    Historical Provider, MD  ondansetron (ZOFRAN-ODT) 8 MG disintegrating tablet Take 1 tablet (8 mg total) by mouth 3 (three) times daily as needed for nausea or vomiting. 04/05/14   Kathyrn Drown, MD  ranitidine (ZANTAC) 300 MG tablet TAKE 1 TABLET BY MOUTH ONCE DAILY FOR ACID REFLUX. 10/05/13   Kathyrn Drown, MD  triamcinolone cream (KENALOG) 0.1 % Apply 1 application topically 2 (two) times daily. Prn rash; use up to 2 weeks 03/02/14   Nilda Simmer, NP  vitamin C (ASCORBIC ACID) 500 MG tablet Take 500-1,000 mg by mouth daily.      Historical Provider, MD      EPWORTH SLEEPINESS SCALE: 10.   BMI: 36.   ARCHITECTURAL SUMMARY: Total recording time was 420 minutes. Sleep efficiency 63 %. Sleep latency 28 minutes. REM latency 279 minutes. Stage NI 4 %, N2 75 % and N3 5 % and REM sleep 16 %. The patient had a significant amount of Alpha intrusion including alpha delta sleep.  RESPIRATORY DATA:  Baseline oxygen saturation is 98 %. The lowest saturation is 82 %. The diagnostic AHI is 0.2. The RDI is 0.2. The REM AHI is 1.  LIMB MOVEMENT SUMMARY: PLM index 0.   ELECTROCARDIOGRAM SUMMARY: Average heart rate is 71 with no significant dysrhythmias observed.   IMPRESSION:  1. Significant amount of alpha intrusion. This is associated with chronic pain syndromes particularly fibromyalgia. It is also associated with nonrestorative sleep. Hypnotics/sleep aid may be useful if there are no contraindications.  Thanks for this referral.  Subhan Hoopes A. Merlene [name], M.D. Diplomat, Tax adviser of Sleep Medicine.

## 2014-04-22 ENCOUNTER — Encounter: Payer: Self-pay | Admitting: Family Medicine

## 2014-04-22 ENCOUNTER — Telehealth: Payer: Self-pay | Admitting: Family Medicine

## 2014-04-22 DIAGNOSIS — R0683 Snoring: Secondary | ICD-10-CM | POA: Insufficient documentation

## 2014-04-22 NOTE — Telephone Encounter (Signed)
Let the patient know that her sleep study did not show any sleep apnea. It was not recommended to be on CPAP. They stated that much of the findings was consistent with her fibromyalgia and poor sleep. Further discussion at office visit if patient desires. Another option is referral to a sleep specialist.

## 2014-04-23 NOTE — Telephone Encounter (Signed)
Results discussed with patient. Patient advised that her sleep study did not show any sleep apnea. It was not recommended to be on CPAP. They stated that much of the findings was consistent with her fibromyalgia and poor sleep. Further discussion at office visit if patient desires. Another option is referral to a sleep specialist.Patient verbalized understanding. Patient states she has a follow up office visit 04/25/14 with Hoyle Sauer.

## 2014-04-25 ENCOUNTER — Ambulatory Visit (INDEPENDENT_AMBULATORY_CARE_PROVIDER_SITE_OTHER): Payer: PPO | Admitting: Nurse Practitioner

## 2014-04-25 ENCOUNTER — Encounter: Payer: Self-pay | Admitting: Nurse Practitioner

## 2014-04-25 VITALS — BP 120/66 | Temp 98.5°F | Ht 65.0 in | Wt 219.0 lb

## 2014-04-25 DIAGNOSIS — F329 Major depressive disorder, single episode, unspecified: Secondary | ICD-10-CM

## 2014-04-25 DIAGNOSIS — M797 Fibromyalgia: Secondary | ICD-10-CM | POA: Diagnosis not present

## 2014-04-25 DIAGNOSIS — F32A Depression, unspecified: Secondary | ICD-10-CM

## 2014-04-25 MED ORDER — BUPROPION HCL ER (XL) 150 MG PO TB24: 150.0000 mg | ORAL_TABLET | Freq: Every day | ORAL | Status: DC

## 2014-04-25 NOTE — Patient Instructions (Signed)
wellbutrin Trazodone Tofranil or similar drug

## 2014-04-28 ENCOUNTER — Encounter: Payer: Self-pay | Admitting: Nurse Practitioner

## 2014-04-28 NOTE — Progress Notes (Signed)
Subjective:  Presents for recheck on depression, fatigue and fibromyalgia. According to patient she had a sleep study which was normal. Pain was better with Prozac but felt like a "zombie". Had side effects on Celexa, Lexapro and Trazodone. Under significant personal stress. No history of seizures.  Objective:   BP 120/66 mmHg  Temp(Src) 98.5 F (36.9 C) (Oral)  Ht 5\' 5"  (1.651 m)  Wt 219 lb (99.338 kg)  BMI 36.44 kg/m2 NAD. Alert, oriented. Lungs clear. Heart RRR.   Assessment:  Problem List Items Addressed This Visit      Musculoskeletal and Integument   Fibromyalgia     Other   Depression - Primary   Relevant Medications   buPROPion (WELLBUTRIN XL) 24 hr tablet     Plan:  Meds ordered this encounter  Medications  . buPROPion (WELLBUTRIN XL) 150 MG 24 hr tablet    Sig: Take 1 tablet (150 mg total) by mouth daily.    Dispense:  30 tablet    Refill:  2    Order Specific Question:  Supervising Provider    Answer:  Mikey Kirschner [[id number]]   Discussed medication options. Trial of Wellbutrin. Encouraged activity as tolerated and getting out of the house for outside activity. Return in about 3 months (around 07/26/2014). Call back sooner if any problems with med.

## 2014-05-09 ENCOUNTER — Encounter (INDEPENDENT_AMBULATORY_CARE_PROVIDER_SITE_OTHER): Payer: Self-pay | Admitting: *Deleted

## 2014-05-09 ENCOUNTER — Encounter (INDEPENDENT_AMBULATORY_CARE_PROVIDER_SITE_OTHER): Payer: Self-pay | Admitting: Internal Medicine

## 2014-05-09 ENCOUNTER — Ambulatory Visit (INDEPENDENT_AMBULATORY_CARE_PROVIDER_SITE_OTHER): Payer: PPO | Admitting: Internal Medicine

## 2014-05-09 ENCOUNTER — Other Ambulatory Visit (INDEPENDENT_AMBULATORY_CARE_PROVIDER_SITE_OTHER): Payer: Self-pay | Admitting: *Deleted

## 2014-05-09 VITALS — BP 134/60 | HR 76 | Temp 98.1°F | Ht 65.0 in | Wt 215.0 lb

## 2014-05-09 DIAGNOSIS — K219 Gastro-esophageal reflux disease without esophagitis: Secondary | ICD-10-CM

## 2014-05-09 DIAGNOSIS — R1310 Dysphagia, unspecified: Secondary | ICD-10-CM

## 2014-05-09 MED ORDER — PANTOPRAZOLE SODIUM 40 MG PO TBEC: 40.0000 mg | DELAYED_RELEASE_TABLET | Freq: Every day | ORAL | Status: DC

## 2014-05-09 NOTE — Progress Notes (Signed)
Subjective:    [name], female    DOB: 1940/04/30, 74 y.o.   MRN: [id number]  HPI Presents today with c/o epigastric pain.She has epigastric pain for 3-4 months. She says she cannot sleep lying down because of the epigastric pain.    She thinks her acid reflux is better.  She says she has a cough that she cannot get rid of.  She has had the cough for about a year. The cough is a dry hacky cough. There is no mucous when she coughs. - Water and a cough drop helps but in the morning the cough is back. . There has been no weight loss. Appetite is good. There has been no weight loss. She occasionally has dysphagia. Sometimes she feels like foods are lodging but will eventually go down. Dysphagia x 1 year.  She usually has a BM daily. No melena or BRRB. Has been off Zantac for over a year. She was only taking it on a as needed basis.   09/09/2011 EGD: Dr.Rourk: Erosive esophagitis. Hiatal hernia.   Review of Systems Married. One adopted child.     Past Medical History  Diagnosis Date  . Anxiety   . GERD (gastroesophageal reflux disease)     EGD/ colon 1/09  . Hyperlipidemia   . OSA (obstructive sleep apnea)   . IBS (irritable bowel syndrome)   . Arthritis   . Depression   . Fibromyalgia   . Refusal of blood transfusions as patient is Jehovah's Witness   . H pylori ulcer 1980-1990    s/p treatment    Past Surgical History  Procedure Laterality Date  . Cardiac catheterization  2002  . Bilateral salpingoophorectomy  1990s  . Appendectomy    . Knee surgery      Multiple  . Tubal ligation    . Inner ear surgery      Left  . Tonsillectomy    . Abdominal hysterectomy  1970  . Colonoscopy  01/2007    Dr. Meriel Flavors  . Esophagogastroduodenoscopy  01/2007    Dr Sharlett Iles- 3 cm hiatal hernia, benign esophageal biopsies, erosive esophagitis, gastritis  . Shoulder surgery  2011  . Colonoscopy with esophagogastroduodenoscopy (egd)  02/16/2012    Procedure:  COLONOSCOPY WITH ESOPHAGOGASTRODUODENOSCOPY (EGD);  Surgeon: Daneil Dolin, MD;  Location: [facility];  Service: Endoscopy;  Laterality: N/A;  8:45    Allergies  Allergen Reactions  . Blood-Group Specific Substance     NO BLOOD PRODUCTS  . Ciprofloxacin Other (See Comments)    Pt reports extreme fatigue  . Codeine Nausea And Vomiting    Current Outpatient Prescriptions on File Prior to Visit  Medication Sig Dispense Refill  . acetaminophen (TYLENOL) 500 MG tablet Take 500 mg by mouth every 6 (six) hours as needed. For pain     . albuterol (PROVENTIL HFA;VENTOLIN HFA) 108 (90 BASE) MCG/ACT inhaler Inhale 2 puffs into the lungs every 6 (six) hours as needed for wheezing. 1 Inhaler 2  . b complex vitamins tablet Take 1 tablet by mouth daily.      . Cholecalciferol (VITAMIN D) 2000 UNITS CAPS Take by mouth. Vitamin D3 2000 units    . Coenzyme Q10 (CO Q 10) 10 MG CAPS Take 1 capsule by mouth daily.      Marland Kitchen dicyclomine (BENTYL) 10 MG capsule Take 10 mg by mouth as needed. For stomach cramps     . HYDROcodone-acetaminophen (NORCO/VICODIN) 5-325 MG per tablet Take 1 tablet by  mouth.    . hyoscyamine (ANASPAZ) 0.125 MG TBDP disintergrating tablet Take 0.125 mg by mouth.    . vitamin C (ASCORBIC ACID) 500 MG tablet Take 500-1,000 mg by mouth daily.      . Flaxseed, Linseed, (FLAX SEED OIL) 1000 MG CAPS Take by mouth.     No current facility-administered medications on file prior to visit.     Objective:   Physical Exam Blood pressure 134/60, pulse 76, temperature 98.1 F (36.7 C), height 5\' 5"  (1.651 m), weight 215 lb (97.523 kg). Alert and oriented. Skin warm and dry. Oral mucosa is moist.   . Sclera anicteric, conjunctivae is pink. Thyroid not enlarged. No cervical lymphadenopathy. Lungs clear. Heart regular rate and rhythm.  Abdomen is soft. Bowel sounds are positive. No hepatomegaly. No abdominal masses felt. Epigastric tenderness.  No edema to lower extremities.           Assessment & Plan:  GERD. Presently not taking a PPI. Hx of erosive esophagitis on EGD in 2013. Dysphagia.to solids. Stricture/ring needs to be ruled out. EGD/ED. The risks and benefits such as perforation, bleeding, and infection were reviewed with the patient and is agreeable.

## 2014-05-09 NOTE — Patient Instructions (Signed)
EGD/ED. The risks and benefits such as perforation, bleeding, and infection were reviewed with the patient and is agreeable. 

## 2014-05-30 ENCOUNTER — Ambulatory Visit: Payer: PPO | Admitting: Family Medicine

## 2014-06-07 ENCOUNTER — Encounter (HOSPITAL_COMMUNITY): Payer: Self-pay | Admitting: *Deleted

## 2014-06-07 ENCOUNTER — Ambulatory Visit (HOSPITAL_COMMUNITY)
Admission: RE | Admit: 2014-06-07 | Discharge: 2014-06-07 | Disposition: A | Discharge status: Home / Self Care | Payer: PPO | Source: Ambulatory Visit | Attending: Internal Medicine | Admitting: Internal Medicine

## 2014-06-07 ENCOUNTER — Encounter (HOSPITAL_COMMUNITY)
Admission: RE | Disposition: A | Discharge status: Home / Self Care | Payer: Self-pay | Source: Ambulatory Visit | Attending: Internal Medicine

## 2014-06-07 DIAGNOSIS — K589 Irritable bowel syndrome without diarrhea: Secondary | ICD-10-CM | POA: Insufficient documentation

## 2014-06-07 DIAGNOSIS — K449 Diaphragmatic hernia without obstruction or gangrene: Secondary | ICD-10-CM | POA: Insufficient documentation

## 2014-06-07 DIAGNOSIS — Z886 Allergy status to analgesic agent status: Secondary | ICD-10-CM | POA: Diagnosis not present

## 2014-06-07 DIAGNOSIS — Z9851 Tubal ligation status: Secondary | ICD-10-CM | POA: Diagnosis not present

## 2014-06-07 DIAGNOSIS — F329 Major depressive disorder, single episode, unspecified: Secondary | ICD-10-CM | POA: Insufficient documentation

## 2014-06-07 DIAGNOSIS — Z90710 Acquired absence of both cervix and uterus: Secondary | ICD-10-CM | POA: Diagnosis not present

## 2014-06-07 DIAGNOSIS — R1310 Dysphagia, unspecified: Secondary | ICD-10-CM | POA: Diagnosis not present

## 2014-06-07 DIAGNOSIS — G4733 Obstructive sleep apnea (adult) (pediatric): Secondary | ICD-10-CM | POA: Diagnosis not present

## 2014-06-07 DIAGNOSIS — Z881 Allergy status to other antibiotic agents status: Secondary | ICD-10-CM | POA: Diagnosis not present

## 2014-06-07 DIAGNOSIS — Z8711 Personal history of peptic ulcer disease: Secondary | ICD-10-CM | POA: Diagnosis not present

## 2014-06-07 DIAGNOSIS — F419 Anxiety disorder, unspecified: Secondary | ICD-10-CM | POA: Insufficient documentation

## 2014-06-07 DIAGNOSIS — E785 Hyperlipidemia, unspecified: Secondary | ICD-10-CM | POA: Insufficient documentation

## 2014-06-07 DIAGNOSIS — M1990 Unspecified osteoarthritis, unspecified site: Secondary | ICD-10-CM | POA: Diagnosis not present

## 2014-06-07 DIAGNOSIS — K219 Gastro-esophageal reflux disease without esophagitis: Secondary | ICD-10-CM

## 2014-06-07 DIAGNOSIS — M797 Fibromyalgia: Secondary | ICD-10-CM | POA: Insufficient documentation

## 2014-06-07 HISTORY — PX: ESOPHAGOGASTRODUODENOSCOPY: SHX5428

## 2014-06-07 HISTORY — PX: ESOPHAGEAL DILATION: SHX303

## 2014-06-07 SURGERY — EGD (ESOPHAGOGASTRODUODENOSCOPY)
Anesthesia: Moderate Sedation

## 2014-06-07 MED ORDER — STERILE WATER FOR IRRIGATION IR SOLN
Status: DC | PRN
  Administered 2014-06-07: 12:00:00

## 2014-06-07 MED ORDER — MIDAZOLAM HCL 5 MG/5ML IJ SOLN
INTRAMUSCULAR | Status: AC
  Filled 2014-06-07: qty 10

## 2014-06-07 MED ORDER — BUTAMBEN-TETRACAINE-BENZOCAINE 2-2-14 % EX AERO
INHALATION_SPRAY | CUTANEOUS | Status: DC | PRN
  Administered 2014-06-07: 2 via TOPICAL

## 2014-06-07 MED ORDER — MEPERIDINE HCL 50 MG/ML IJ SOLN
INTRAMUSCULAR | Status: AC
  Filled 2014-06-07: qty 1

## 2014-06-07 MED ORDER — MEPERIDINE HCL 50 MG/ML IJ SOLN
INTRAMUSCULAR | Status: DC | PRN
  Administered 2014-06-07 (×2): 25 mg via INTRAVENOUS

## 2014-06-07 MED ORDER — SODIUM CHLORIDE 0.9 % IV SOLN
INTRAVENOUS | Status: DC
  Administered 2014-06-07: 1000 mL via INTRAVENOUS

## 2014-06-07 MED ORDER — MIDAZOLAM HCL 5 MG/5ML IJ SOLN
INTRAMUSCULAR | Status: DC | PRN
  Administered 2014-06-07: 1 mg via INTRAVENOUS
  Administered 2014-06-07 (×2): 2 mg via INTRAVENOUS
  Administered 2014-06-07: 1 mg via INTRAVENOUS
  Administered 2014-06-07: 2 mg via INTRAVENOUS

## 2014-06-07 NOTE — Op Note (Signed)
EGD PROCEDURE REPORT  PATIENT:  Ruth [name]  MR#:  [id number] Birthdate:  04-28-1940, 74 y.o., female Endoscopist:  Dr. Rogene Houston, MD Referred By:  Dr. Nicki Reaper looking, MD Procedure Date: 06/07/2014  Procedure:   EGD with ED  Indications:  Patient is 74 year old Caucasian female was history of erosive reflux esophagitis who is presently on dietary measures who now presents with intermittent solid food dysphagia of one-year duration. She has remote history of peptic ulcer disease and has been treated for H. pylori infection. She is having arthritic pain and interested in taking NSAIDs.  She had barium study in March last year revealing small sliding hiatal hernia but no other abnormalities.           Informed Consent:  The risks, benefits, alternatives & imponderables which include, but are not limited to, bleeding, infection, perforation, drug reaction and potential missed lesion have been reviewed.  The potential for biopsy, lesion removal, esophageal dilation, etc. have also been discussed.  Questions have been answered.  All parties agreeable.  Please see history & physical in medical record for more information.  Medications:  Demerol 50 mg IV Versed 8 mg IV Cetacaine spray topically for oropharyngeal anesthesia  Description of procedure:  The endoscope was introduced through the mouth and advanced to the second portion of the duodenum without difficulty or limitations. The mucosal surfaces were surveyed very carefully during advancement of the scope and upon withdrawal.  Findings:  Esophagus:  Mucosa of the esophagus was normal. GE junction was unremarkable. GEJ:  35 cm Hiatus:  37 cm Stomach:  Stomach was empty and distended very well with insufflation. Folds in the proximal stomach were normal. Examination of mucosa at gastric body, antrum, pyloric channel, and address fundus and cardia was normal. Duodenum:  Normal bulbar and post bulbar mucosa.  Therapeutic/Diagnostic  Maneuvers Performed:   Esophagus was dilated by passing 56 [place] Maloney dilator to full insertion. As the dilator was withdrawn endoscope was passed again and no disruption noted to esophageal mucosa.  Complications:  None  Impression: Previously identified erosive esophagitis is healed. No evidence of gastritis or peptic ulcer disease. Small sliding hiatal hernia without Schatzki's ring. Esophagus dilated by passing 56 [place] Maloney dilator but no mucosal disruption noted.  Recommendations:  Standard instructions given. Since patient's arthritis seems to be affecting quality of life, she can take OTC Advil 400 mg twice daily after meals as needed but should continue pantoprazole 40 mg every morning for GI prophylaxis. Patient advised to call office with progress report regarding dysphagia. If dysphagia persists consider esophageal manometry and impedance study.  REHMAN,NAJEEB U  06/07/2014  12:25 PM  CC: Dr. Sallee Lange, MD & Dr. Rayne Du ref. provider found

## 2014-06-07 NOTE — H&P (Signed)
Ruth [name] is an 74 y.o. female.   Chief Complaint: Patient is here for EGD and ED. HPI: Patient is 74 year old Caucasian female who presents with one-year history of intermittent solid food dysphagia. She's had multiple episodes of food impaction relieved with Heimlich maneuver. She has history of erosive reflux esophagitis but presently on dietary measures alone. She denies nausea vomiting abdominal pain or melena. She has history of peptic ulcer disease. She complains of left shoulder pain and would like to go back on NSAID if possible.  Past Medical History  Diagnosis Date  . Anxiety   . GERD (gastroesophageal reflux disease)     EGD/ colon 1/09  . Hyperlipidemia   . OSA (obstructive sleep apnea)   . IBS (irritable bowel syndrome)   . Arthritis   . Depression   . Fibromyalgia   . Refusal of blood transfusions as patient is Jehovah's Witness   . H pylori ulcer 1980-1990    s/p treatment    Past Surgical History  Procedure Laterality Date  . Cardiac catheterization  2002  . Bilateral salpingoophorectomy  1990s  . Appendectomy    . Knee surgery      Multiple  . Tubal ligation    . Inner ear surgery      Left  . Tonsillectomy    . Abdominal hysterectomy  1970  . Colonoscopy  01/2007    Dr. Meriel Flavors  . Esophagogastroduodenoscopy  01/2007    Dr Sharlett Iles- 3 cm hiatal hernia, benign esophageal biopsies, erosive esophagitis, gastritis  . Shoulder surgery  2011  . Colonoscopy with esophagogastroduodenoscopy (egd)  02/16/2012    Procedure: COLONOSCOPY WITH ESOPHAGOGASTRODUODENOSCOPY (EGD);  Surgeon: Daneil Dolin, MD;  Location: [facility];  Service: Endoscopy;  Laterality: N/A;  8:45    Family History  Problem Relation Age of Onset  . Diabetes Father   . Heart attack Father   . Lung cancer Father   . Cirrhosis Father 84    etoh cirrhosis  . Lung cancer Sister   . Depression Sister   . Hypertension Sister   . Diabetes Sister   . Lung cancer Sister   .  Paranoid behavior Mother     depression  . Colon polyps Mother    Social History:  reports that she has never smoked. She has never used smokeless tobacco. She reports that she drinks alcohol. She reports that she does not use illicit drugs.  Allergies:  Allergies  Allergen Reactions  . Blood-Group Specific Substance     NO BLOOD PRODUCTS  . Ciprofloxacin Other (See Comments)    Pt reports extreme fatigue  . Codeine Nausea And Vomiting    Medications Prior to Admission  Medication Sig Dispense Refill  . acetaminophen (TYLENOL) 500 MG tablet Take 1,000 mg by mouth 3 (three) times daily as needed for moderate pain. For pain     . albuterol (PROVENTIL HFA;VENTOLIN HFA) 108 (90 BASE) MCG/ACT inhaler Inhale 2 puffs into the lungs every 6 (six) hours as needed for wheezing. 1 Inhaler 2  . b complex vitamins tablet Take 1 tablet by mouth daily.      . Cholecalciferol (VITAMIN D) 2000 UNITS CAPS Take by mouth. Vitamin D3 2000 units    . Coenzyme Q10 (CO Q 10) 10 MG CAPS Take 1 capsule by mouth daily.      . diazepam (VALIUM) 5 MG tablet Take 5 mg by mouth daily as needed for muscle spasms.    Marland Kitchen glucosamine-chondroitin 500-400 MG tablet  Take 1 tablet by mouth 2 (two) times daily.    Marland Kitchen HYDROcodone-acetaminophen (NORCO/VICODIN) 5-325 MG per tablet Take 1 tablet by mouth daily as needed for moderate pain.     Marland Kitchen KRILL OIL PO Take by mouth.    . vitamin C (ASCORBIC ACID) 500 MG tablet Take 500-1,000 mg by mouth daily.      Marland Kitchen dicyclomine (BENTYL) 10 MG capsule Take 10 mg by mouth as needed. For stomach cramps     . FLOVENT HFA 110 MCG/ACT inhaler Inhale 1 puff into the lungs 2 (two) times daily as needed (shortness of breath).   0  . pantoprazole (PROTONIX) 40 MG tablet Take 1 tablet (40 mg total) by mouth daily. 90 tablet 4    No results found for this or any previous visit (from the past 48 hour(s)). No results found.  ROS  Blood pressure 135/52, pulse 77, temperature 98.1 F (36.7 C),  temperature source Oral, resp. rate 18, height 5\' 5"  (1.651 m), weight 207 lb 6.4 oz (94.076 kg), SpO2 100 %. Physical Exam  Constitutional: She appears well-developed and well-nourished.  HENT:  Mouth/Throat: Oropharynx is clear and moist.  Eyes: Conjunctivae are normal. No scleral icterus.  Neck: No thyromegaly present.  Cardiovascular: Normal rate, regular rhythm and normal heart sounds.   No murmur heard. Respiratory: Effort normal and breath sounds normal.  GI: Soft. She exhibits no distension and no mass. There is no tenderness.  Lymphadenopathy:    She has no cervical adenopathy.     Assessment/Plan Solid food dysphagia in a patient with history of GERD. EGD with ED.  Naftula Donahue U 06/07/2014, 11:57 AM

## 2014-06-07 NOTE — Discharge Instructions (Signed)
Resume usual medications including pantoprazole. It is 40 mg by mouth to be taken 30 minutes before breakfast. Advil two tablets by mouth twice daily after meals as needed(each tablet is 200 mg). Resume usual diet. No driving for 24 hours. Please call office with progress report in one week.   Esophagogastroduodenoscopy Care After Refer to this sheet in the next few weeks. These instructions provide you with information on caring for yourself after your procedure. Your caregiver may also give you more specific instructions. Your treatment has been planned according to current medical practices, but problems sometimes occur. Call your caregiver if you have any problems or questions after your procedure.  HOME CARE INSTRUCTIONS  Do not eat or drink anything until the numbing medicine (local anesthetic) has worn off and your gag reflex has returned. You will know that the local anesthetic has worn off when you can swallow comfortably.  Do not drive for 12 hours after the procedure or as directed by your caregiver.  Only take medicines as directed by your caregiver. SEEK MEDICAL CARE IF:   You cannot stop coughing.  You are not urinating at all or less than usual. SEEK IMMEDIATE MEDICAL CARE IF:  You have difficulty swallowing.  You cannot eat or drink.  You have worsening throat or chest pain.  You have dizziness, lightheadedness, or you faint.  You have nausea or vomiting.  You have chills.  You have a fever.  You have severe abdominal pain.  You have black, tarry, or bloody stools. Document Released: 01/20/2012 Document Reviewed: 01/20/2012 [facility] Patient Information 2015 [place]. This information is not intended to replace advice given to you by your health care provider. Make sure you discuss any questions you have with your health care provider.

## 2014-06-08 ENCOUNTER — Encounter (HOSPITAL_COMMUNITY): Payer: Self-pay | Admitting: Internal Medicine

## 2014-07-06 ENCOUNTER — Encounter: Payer: Self-pay | Admitting: Nurse Practitioner

## 2014-07-06 ENCOUNTER — Ambulatory Visit (INDEPENDENT_AMBULATORY_CARE_PROVIDER_SITE_OTHER): Payer: PPO | Admitting: Nurse Practitioner

## 2014-07-06 VITALS — BP 112/70 | Ht 65.0 in | Wt 202.0 lb

## 2014-07-06 DIAGNOSIS — F419 Anxiety disorder, unspecified: Secondary | ICD-10-CM | POA: Insufficient documentation

## 2014-07-06 DIAGNOSIS — M1990 Unspecified osteoarthritis, unspecified site: Secondary | ICD-10-CM

## 2014-07-06 DIAGNOSIS — M797 Fibromyalgia: Secondary | ICD-10-CM

## 2014-07-06 MED ORDER — DULOXETINE HCL 20 MG PO CPEP: ORAL_CAPSULE | ORAL | Status: DC

## 2014-07-10 ENCOUNTER — Encounter: Payer: Self-pay | Admitting: Nurse Practitioner

## 2014-07-10 NOTE — Progress Notes (Signed)
Subjective:   Presents with her husband for recheck of her fibromyalgia and anxiety. Was unable to take Wellbutrin, it disturbed her sleep made her "feel bad". Has taken multiple medications for anxiety and fibromyalgia. Is unsure whether she has taken Cymbalta and if she had any problems with this. Limits her hydrocodone intake to half a pill 2 to 3 times per week which greatly helps her pain. Wants to limit this to avoid any addiction issues. Is planning a knee replacement but does not have a date set at this point. Also has a gland that is swollen in her neck area noticed for the past few days. Mildly tender. No fever. To area near her right shoulder blade feels "hot" her but no warmth noted by her husband. Wants to know if her hemoglobin has been checked in the past few months.  Objective:   BP 112/70 mmHg  Ht 5\' 5"  (1.651 m)  Wt 202 lb (91.627 kg)  BMI 33.61 kg/m2  NAD. Alert, oriented. Mildly anxious affect. Lungs clear. Heart regular rate rhythm. Neck supple with mild soft fluctuant anterior cervical adenopathy slightly tender. No suspicious lymph nodes palpated. Area in her right upper back tender to palpation but no warmth or erythema noted.  Assessment:  Problem List Items Addressed This Visit      Musculoskeletal and Integument   Degenerative joint disease   Fibromyalgia - Primary     Other   Anxiety   Relevant Medications   DULoxetine (CYMBALTA) 20 MG capsule      Plan:  Meds ordered this encounter  Medications  . DULoxetine (CYMBALTA) 20 MG capsule    Sig: One po qd    Dispense:  30 capsule    Refill:  2    Order Specific Question:  Supervising Provider    Answer:  Mikey Kirschner [[id number]]    continue current medications as directed. Patient agrees to a trial of low dose Cymbalta , if tolerated will try to titrate dose over time. DC med and call if any problems. Discussed importance of stress reduction. Reviewed warning signs regarding her lymph nodes, there is no  indication for further workup at this time. Return in about 3 months (around 10/06/2014).

## 2014-07-24 ENCOUNTER — Ambulatory Visit: Payer: PPO | Admitting: Family Medicine

## 2014-08-01 ENCOUNTER — Encounter: Payer: Self-pay | Admitting: Nurse Practitioner

## 2014-08-01 ENCOUNTER — Ambulatory Visit (INDEPENDENT_AMBULATORY_CARE_PROVIDER_SITE_OTHER): Payer: PPO | Admitting: Nurse Practitioner

## 2014-08-01 VITALS — BP 110/72 | Ht 65.5 in | Wt 198.0 lb

## 2014-08-01 DIAGNOSIS — N94819 Vulvodynia, unspecified: Secondary | ICD-10-CM

## 2014-08-01 DIAGNOSIS — K5909 Other constipation: Secondary | ICD-10-CM

## 2014-08-01 DIAGNOSIS — K5904 Chronic idiopathic constipation: Secondary | ICD-10-CM

## 2014-08-01 MED ORDER — HYDROCODONE-ACETAMINOPHEN 5-325 MG PO TABS: 1.0000 | ORAL_TABLET | Freq: Every day | ORAL | Status: DC | PRN

## 2014-08-03 ENCOUNTER — Encounter: Payer: Self-pay | Admitting: Nurse Practitioner

## 2014-08-03 DIAGNOSIS — K5904 Chronic idiopathic constipation: Secondary | ICD-10-CM | POA: Insufficient documentation

## 2014-08-03 DIAGNOSIS — K5900 Constipation, unspecified: Secondary | ICD-10-CM | POA: Insufficient documentation

## 2014-08-03 DIAGNOSIS — N94819 Vulvodynia, unspecified: Secondary | ICD-10-CM | POA: Insufficient documentation

## 2014-08-03 NOTE — Progress Notes (Signed)
Subjective:  Presents for continue discomfort in the external GU area. No relief with estrogen cream. No rash or itching. Complaints of sharp pain sometimes tingling in the vulvar area occurring at least 5-6 times per week usually brief. Worse with prolonged sitting or change in position. No vaginal discharge. No pelvic pain. History of fibromyalgia. Did not start her Cymbalta as previously recommended. Has tried multiple medications for fibromyalgia pain. On a low-carb diet. Has lost weight. Has a chronic history of constipation from childhood. Takes daily dietary fiber supplement and stool softeners. Has been slightly worse lately. Also requesting refill on her pain medication, takes it very rarely, current prescription has expired.  Objective:   BP 110/72 mmHg  Ht 5' 5.5" (1.664 m)  Wt 198 lb (89.812 kg)  BMI 32.44 kg/m2 NAD. Alert, oriented. Calm affect. Lungs clear. Heart regular rate rhythm. Abdomen soft nondistended with active bowel sounds 4; no obvious masses or tenderness.  Assessment:  Problem List Items Addressed This Visit      Digestive   Functional constipation     Genitourinary   Vulvodynia - Primary     Plan:  Meds ordered this encounter  Medications  . CALCIUM PO    Sig: Take by mouth daily.  Marland Kitchen MAGNESIUM PO    Sig: Take by mouth daily.  Marland Kitchen HYDROcodone-acetaminophen (NORCO/VICODIN) 5-325 MG per tablet    Sig: Take 1 tablet by mouth daily as needed for moderate pain.    Dispense:  30 tablet    Refill:  0    Order Specific Question:  Supervising Provider    Answer:  Mikey Kirschner [[id number]]   Patient has her bottle of hydrocodone, still has several pills left but bottle has expired. Given new prescription. Recommend restarting Mira lax as directed. If no relief and constipation over the next 2-3 weeks, patient to call back to discuss possible prescription medication such as Amitiza. Strongly encourage patient to start Cymbalta 20 mg to help fibromyalgia and vulvodynia.  Encouraged continued healthy diet and weight loss. Return if symptoms worsen or fail to improve. Routine follow-up.

## 2014-10-19 ENCOUNTER — Ambulatory Visit (HOSPITAL_COMMUNITY)
Admission: RE | Admit: 2014-10-19 | Discharge: 2014-10-19 | Disposition: A | Discharge status: Home / Self Care | Payer: PPO | Source: Ambulatory Visit | Attending: Family Medicine | Admitting: Family Medicine

## 2014-10-19 ENCOUNTER — Ambulatory Visit (INDEPENDENT_AMBULATORY_CARE_PROVIDER_SITE_OTHER): Payer: PPO | Admitting: Family Medicine

## 2014-10-19 VITALS — BP 128/80 | Ht 65.0 in | Wt 196.0 lb

## 2014-10-19 DIAGNOSIS — M25561 Pain in right knee: Secondary | ICD-10-CM

## 2014-10-19 NOTE — Progress Notes (Signed)
   Subjective:    Patient ID: [name], female    DOB: [birth year], 74 y.o.   MRN: [id number]  Leg Pain  Incident onset: more than month ago. Pain location: behind right knee, and right calf.    kneees progressive arthritis  Hx f surg on left knee  cndidate down the road for surgerynot taking any meds for pain. Rarely takes hydrocodone  Patient notes progressive calf pain particularly over the past week. Upper right calf. Primarily posterior. Also associated with pain behind the knee.  No history of DVTs. Does not take hormone supplementation . Left knee pain. Started today.    Review of Systems No chest pain no shortness of breath no back pain no loss of consciousness    Objective:   Physical Exam  Alert vitals stable HEENT normal. Lungs clear. Heart regular in rhythm. Right posterior calf tender superior portion. No edema no true Homans sign knee positive crepitations no palpable Baker's cyst    Assessment & Plan:  Impression subacute right posterior calf pain superimposed imposed upon chronic musculoskeletal knee pain. Plan need to rule out DVT discussed. Sent for both d-dimer and ultrasound. I spoke also with the radiologist plus the radiology team 2 in an effort to get the patient the proper test without having good in [place] since this is Friday evening. Also waited around for results. Addendum to results negative ultrasound symptomatic care recommended. Addendum 10 blood work d-dimer came back negative no further workup necessary WSL

## 2014-10-20 ENCOUNTER — Other Ambulatory Visit (HOSPITAL_COMMUNITY)
Admission: RE | Admit: 2014-10-20 | Discharge: 2014-10-20 | Disposition: A | Discharge status: Home / Self Care | Payer: PPO | Source: Ambulatory Visit | Attending: Family Medicine | Admitting: Family Medicine

## 2014-10-20 DIAGNOSIS — M25561 Pain in right knee: Secondary | ICD-10-CM | POA: Insufficient documentation

## 2014-10-20 LAB — D-DIMER, QUANTITATIVE: D-Dimer, Quant: 0.38 ug/mL-FEU (ref 0.00–0.48)

## 2014-11-05 ENCOUNTER — Telehealth: Payer: Self-pay | Admitting: Family Medicine

## 2014-11-05 MED ORDER — HYDROCODONE-ACETAMINOPHEN 5-325 MG PO TABS: 1.0000 | ORAL_TABLET | Freq: Every day | ORAL | Status: DC | PRN

## 2014-11-05 NOTE — Telephone Encounter (Signed)
May refill #30 

## 2014-11-05 NOTE — Telephone Encounter (Signed)
HYDROcodone-acetaminophen (NORCO/VICODIN) 5-325 MG per tablet  Pt needs a refill on this for her pain for her fibro myalgia   Call when ready to pick up

## 2014-11-05 NOTE — Telephone Encounter (Signed)
Called patient and informed her that prescription is ready for pick up. Patient verbalized understanding.

## 2014-11-12 ENCOUNTER — Ambulatory Visit (INDEPENDENT_AMBULATORY_CARE_PROVIDER_SITE_OTHER): Payer: PPO | Admitting: Family Medicine

## 2014-11-12 ENCOUNTER — Encounter: Payer: Self-pay | Admitting: Family Medicine

## 2014-11-12 VITALS — BP 128/70 | Temp 98.6°F | Ht 65.0 in | Wt 195.0 lb

## 2014-11-12 DIAGNOSIS — W5501XA Bitten by cat, initial encounter: Secondary | ICD-10-CM | POA: Diagnosis not present

## 2014-11-12 DIAGNOSIS — S6991XA Unspecified injury of right wrist, hand and finger(s), initial encounter: Secondary | ICD-10-CM | POA: Diagnosis not present

## 2014-11-12 DIAGNOSIS — R7309 Other abnormal glucose: Secondary | ICD-10-CM | POA: Diagnosis not present

## 2014-11-12 DIAGNOSIS — R7303 Prediabetes: Secondary | ICD-10-CM

## 2014-11-12 DIAGNOSIS — E785 Hyperlipidemia, unspecified: Secondary | ICD-10-CM | POA: Diagnosis not present

## 2014-11-12 MED ORDER — AMOXICILLIN-POT CLAVULANATE 875-125 MG PO TABS: 1.0000 | ORAL_TABLET | Freq: Two times a day (BID) | ORAL | Status: DC

## 2014-11-12 NOTE — Patient Instructions (Signed)

## 2014-11-12 NOTE — Progress Notes (Signed)
   Subjective:    [name], female    DOB: 05/31/1940, 74 y.o.   MRN: [id number]  HPIbitten by a stray cat on right middle finger yesterday. Pt called animal control and they picked up the cat.  Patient states that she was feeding the cat bit her animal control picked up Today   Review of Systems Patient with tenderness of the finger denies wrist pain forearm pain fever or chills    Objective:   Physical Exam  The patient has a cat bite to the middle finger on the right side. There is swelling redness tenderness on the distal aspect. No abscess noted      Assessment & Plan:  Cat bite-animal was picked up by animal control being watch for any signs of rabies, Augmentin 875 twice a day for the next 7-10 days, warm compresses frequently, if ongoing troubles follow-up  Standard medical follow-up in the near future with labs

## 2014-12-01 LAB — LIPID PANEL
Chol/HDL Ratio: 4.7 ratio units — ABNORMAL HIGH (ref 0.0–4.4)
Cholesterol, Total: 262 mg/dL — ABNORMAL HIGH (ref 100–199)
HDL: 56 mg/dL (ref >39)
LDL Calculated: 185 mg/dL — ABNORMAL HIGH (ref 0–99)
Triglycerides: 106 mg/dL (ref 0–149)
VLDL Cholesterol Cal: 21 mg/dL (ref 5–40)

## 2014-12-01 LAB — BASIC METABOLIC PANEL
BUN/Creatinine Ratio: 24 (ref 11–26)
BUN: 15 mg/dL (ref 8–27)
CO2: 24 mmol/L (ref 18–29)
Calcium: 9.6 mg/dL (ref 8.7–10.3)
Chloride: 100 mmol/L (ref 97–108)
Creatinine, Ser: 0.62 mg/dL (ref 0.57–1.00)
GFR calc Af Amer: 103 mL/min/{1.73_m2} (ref >59)
GFR calc non Af Amer: 90 mL/min/{1.73_m2} (ref >59)
Glucose: 95 mg/dL (ref 65–99)
Potassium: 4.6 mmol/L (ref 3.5–5.2)
Sodium: 139 mmol/L (ref 134–144)

## 2014-12-01 LAB — HEMOGLOBIN A1C
Est. average glucose Bld gHb Est-mCnc: 123 mg/dL
Hgb A1c MFr Bld: 5.9 % — ABNORMAL HIGH (ref 4.8–5.6)

## 2014-12-13 ENCOUNTER — Encounter: Payer: Self-pay | Admitting: Family Medicine

## 2014-12-13 ENCOUNTER — Ambulatory Visit (INDEPENDENT_AMBULATORY_CARE_PROVIDER_SITE_OTHER): Payer: PPO | Admitting: Family Medicine

## 2014-12-13 VITALS — BP 122/70 | Ht 65.0 in | Wt 198.5 lb

## 2014-12-13 DIAGNOSIS — R7303 Prediabetes: Secondary | ICD-10-CM | POA: Diagnosis not present

## 2014-12-13 DIAGNOSIS — E785 Hyperlipidemia, unspecified: Secondary | ICD-10-CM | POA: Diagnosis not present

## 2014-12-13 DIAGNOSIS — F419 Anxiety disorder, unspecified: Secondary | ICD-10-CM | POA: Diagnosis not present

## 2014-12-13 DIAGNOSIS — Z01818 Encounter for other preprocedural examination: Secondary | ICD-10-CM

## 2014-12-13 MED ORDER — ROSUVASTATIN CALCIUM 5 MG PO TABS: 5.0000 mg | ORAL_TABLET | Freq: Every day | ORAL | Status: DC

## 2014-12-13 MED ORDER — FLUOXETINE HCL 10 MG PO TABS: ORAL_TABLET | ORAL | Status: DC

## 2014-12-13 NOTE — Progress Notes (Signed)
   Subjective:    [name], female    DOB: 1940-08-18, 74 y.o.   MRN: [id number]  Hyperlipidemia This is a chronic problem. The current episode started more than 1 year ago. Pertinent negatives include no chest pain. There are no compliance problems.    Patient had recent labs 10/14.  Patient states no other concerns this visit. Lab work reviewed with the patient significant elevation of LDL. Using [employer] APP the predictive percentage for heart diseases well above 20% next 10 years  Patient does relate getting short of breath when she pushes herself she denies any chest tightness or pressure Patient does relate bilateral knee pain she will be seen her orthopedist soon for surgical evaluation Patient under a lot of stress her husband recently suffered a heart attack he is taking Prozac and seems to help Review of Systems  Constitutional: Negative for activity change, appetite change and fatigue.  HENT: Negative for congestion.   Respiratory: Negative for cough.   Cardiovascular: Negative for chest pain.  Gastrointestinal: Negative for abdominal pain.  Endocrine: Negative for polydipsia and polyphagia.  Neurological: Negative for weakness.  Psychiatric/Behavioral: Negative for confusion.       Objective:   Physical Exam Lungs clear heart regular pulse normal extremities no edema abdomen is soft  Lab work shows prediabetes diet was discussed the importance of keeping starches down discussed as well     Assessment & Plan:  Hyperlipidemia patient does agree to Crestor 5 mg daily. Recheck lipid liver profile in 8 weeks she's had trouble with other statins causing musculoskeletal symptomatology she's been wary of statins but her risk for heart diseases 20% or better given her risk factors.  Patient is facing potentially to knee replacements over the next 1 year patient does not exercise much because of her knees she gets short winded when she tries to push  herself around I believe patient would benefit from seeing cardiology for cardiac clearance  Blood pressure under good control continue current measures  Mild obesity patient is trying hard watching diet  Renew Prozac  25 minutes was spent with the patient. Greater than half the time was spent in discussion and answering questions and counseling regarding the issues that the patient came in for today.

## 2014-12-19 ENCOUNTER — Telehealth: Payer: Self-pay | Admitting: Family Medicine

## 2014-12-19 NOTE — Telephone Encounter (Signed)
Have pre op letter I'll send back to your office in red folder.

## 2014-12-19 NOTE — Telephone Encounter (Signed)
Her last visit with me will count as her preoperative visit with me. She still needs to see cardiology. Please let me know who is doing her surgery so I can send a preop clearance letter from a medical perspective thank you

## 2014-12-19 NOTE — Telephone Encounter (Signed)
Pre op paper faxed in, went to make appt for this but see a appt  With her cardiology to do one for her. Do you want her to still come here  For one as well?   Last seen for reg OV 12/13/14

## 2014-12-20 ENCOUNTER — Encounter: Payer: Self-pay | Admitting: Family Medicine

## 2014-12-20 NOTE — Telephone Encounter (Signed)
A letter was completed. 

## 2014-12-20 NOTE — Telephone Encounter (Signed)
From a medical perspective she is approved for surgery. I have counseled the patient to follow every direction from the orthopedist to avoid major complications such as infections or DVT. The patient is scheduled to be seen by urology on November 9 for cardiac preoperative clearance. A letter is being sent to the orthopedist regarding this issue

## 2014-12-21 NOTE — Telephone Encounter (Signed)
Form sent by Danae Chen

## 2014-12-26 ENCOUNTER — Encounter: Payer: Self-pay | Admitting: Cardiovascular Disease

## 2014-12-26 ENCOUNTER — Ambulatory Visit (INDEPENDENT_AMBULATORY_CARE_PROVIDER_SITE_OTHER): Payer: PPO | Admitting: Cardiovascular Disease

## 2014-12-26 VITALS — BP 116/74 | HR 76 | Ht 65.0 in | Wt 199.0 lb

## 2014-12-26 DIAGNOSIS — Z136 Encounter for screening for cardiovascular disorders: Secondary | ICD-10-CM

## 2014-12-26 DIAGNOSIS — Z8249 Family history of ischemic heart disease and other diseases of the circulatory system: Secondary | ICD-10-CM | POA: Diagnosis not present

## 2014-12-26 DIAGNOSIS — G4730 Sleep apnea, unspecified: Secondary | ICD-10-CM

## 2014-12-26 DIAGNOSIS — E785 Hyperlipidemia, unspecified: Secondary | ICD-10-CM

## 2014-12-26 DIAGNOSIS — Z01818 Encounter for other preprocedural examination: Secondary | ICD-10-CM | POA: Diagnosis not present

## 2014-12-26 NOTE — Patient Instructions (Addendum)
Your physician has requested that you have an echocardiogram. Echocardiography is a painless test that uses sound waves to create images of your heart. It provides your doctor with information about the size and shape of your heart and how well your heart's chambers and valves are working. This procedure takes approximately one hour. There are no restrictions for this procedure. Office will contact with results via phone or letter.   MD strongly encourages you to take your Crestor 5mg  daily as recommended. Continue all other medications.   Follow up as needed / based off test results.
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
## 2014-12-26 NOTE — Addendum Note (Signed)
Addended by: Laurine Blazer on: 12/26/2014 10:13 AM   Modules accepted: Orders

## 2015-01-01 ENCOUNTER — Ambulatory Visit (HOSPITAL_COMMUNITY): Payer: PPO

## 2015-01-02 ENCOUNTER — Ambulatory Visit (HOSPITAL_COMMUNITY)
Admission: RE | Admit: 2015-01-02 | Discharge: 2015-01-02 | Disposition: A | Discharge status: Home / Self Care | Payer: PPO | Source: Ambulatory Visit | Attending: Cardiovascular Disease | Admitting: Cardiovascular Disease

## 2015-01-02 DIAGNOSIS — Z01818 Encounter for other preprocedural examination: Secondary | ICD-10-CM | POA: Diagnosis not present

## 2015-01-02 DIAGNOSIS — E785 Hyperlipidemia, unspecified: Secondary | ICD-10-CM | POA: Insufficient documentation

## 2015-01-02 DIAGNOSIS — Z8249 Family history of ischemic heart disease and other diseases of the circulatory system: Secondary | ICD-10-CM | POA: Diagnosis not present

## 2015-01-03 ENCOUNTER — Telehealth: Payer: Self-pay | Admitting: *Deleted

## 2015-01-03 NOTE — Telephone Encounter (Signed)
Notes Recorded by Laurine Blazer, LPN on [id number] at 9:05 AM Patient notified. Copy fwd to pmd. Clearance note sent to [facility] (Dr. Alvan Dame).

## 2015-01-03 NOTE — Telephone Encounter (Signed)
-----   Message from Herminio Commons, MD sent at 01/02/2015  2:18 PM EST ----- Normal pumping function. Low risk for surgery. Follow up prn.

## 2015-01-04 ENCOUNTER — Ambulatory Visit: Payer: Self-pay | Admitting: Cardiology

## 2015-01-08 ENCOUNTER — Telehealth: Payer: Self-pay | Admitting: Family Medicine

## 2015-01-08 NOTE — Telephone Encounter (Signed)
Patient stated she has had to increase to one tablet twice a day due to fibromyalgia joint pain

## 2015-01-08 NOTE — Telephone Encounter (Signed)
She may have a prescription for hydrocodone one twice a day please tell her that I would not recommend increasing above this. If still having fibromyalgia pain this study show that Lyrica or gabapentin do much better. I would recommend the patient to follow-up if ongoing troubles may give a prescription for 60 hydrocodone tablets 1 twice a day when necessary caution drowsiness-remind patient that this type of prescription does require regular follow-ups every 3 months

## 2015-01-08 NOTE — Telephone Encounter (Signed)
Pt is needing a refill on her hydrocodone. Pt is stating that she is having to take more than one a day. Pt wants to know what to do about that. Please advise.

## 2015-01-09 ENCOUNTER — Other Ambulatory Visit: Payer: Self-pay | Admitting: *Deleted

## 2015-01-09 MED ORDER — HYDROCODONE-ACETAMINOPHEN 5-325 MG PO TABS: 1.0000 | ORAL_TABLET | Freq: Two times a day (BID) | ORAL | Status: DC | PRN

## 2015-01-09 NOTE — Telephone Encounter (Signed)
Discussed with pt. Pt states she also needs for knee pain. Script ready for pickup. Pt notified.

## 2015-01-17 ENCOUNTER — Telehealth: Payer: Self-pay | Admitting: Family Medicine

## 2015-01-17 NOTE — Telephone Encounter (Signed)
The patient states she cannot afford Lipitor/Crestor. Per her request please cancel current statin. Start pravastatin 20 mg, patient may have 30 or 90 day with 6 months refill, by springtime she needs to be doing lab work and follow-up office visit

## 2015-01-18 MED ORDER — PRAVASTATIN SODIUM 20 MG PO TABS: 20.0000 mg | ORAL_TABLET | Freq: Every day | ORAL | Status: DC

## 2015-01-18 MED ORDER — DICYCLOMINE HCL 10 MG PO CAPS: 10.0000 mg | ORAL_CAPSULE | Freq: Three times a day (TID) | ORAL | Status: DC | PRN

## 2015-01-18 MED ORDER — RANITIDINE HCL 300 MG PO TABS: 300.0000 mg | ORAL_TABLET | Freq: Every day | ORAL | Status: DC

## 2015-01-18 NOTE — Addendum Note (Signed)
Addended by: Jesusita Oka on: 01/18/2015 08:40 AM   Modules accepted: Orders, Medications

## 2015-01-18 NOTE — Telephone Encounter (Signed)
Discussed with patient. Patient also needs refills on dicyclomine and ranitidine. Approved per Dr. Nicki Reaper. Meds sent to pharmacy.

## 2015-01-21 ENCOUNTER — Other Ambulatory Visit: Payer: Self-pay | Admitting: Family Medicine

## 2015-01-21 ENCOUNTER — Ambulatory Visit: Payer: PPO | Admitting: Nurse Practitioner

## 2015-01-21 DIAGNOSIS — Z1231 Encounter for screening mammogram for malignant neoplasm of breast: Secondary | ICD-10-CM

## 2015-01-23 ENCOUNTER — Ambulatory Visit (INDEPENDENT_AMBULATORY_CARE_PROVIDER_SITE_OTHER): Payer: PPO | Admitting: Nurse Practitioner

## 2015-01-23 ENCOUNTER — Encounter: Payer: Self-pay | Admitting: Nurse Practitioner

## 2015-01-23 VITALS — BP 118/82 | Ht 65.0 in | Wt 200.6 lb

## 2015-01-23 DIAGNOSIS — M797 Fibromyalgia: Secondary | ICD-10-CM

## 2015-01-23 DIAGNOSIS — K581 Irritable bowel syndrome with constipation: Secondary | ICD-10-CM | POA: Diagnosis not present

## 2015-01-23 MED ORDER — DICYCLOMINE HCL 10 MG PO CAPS: 10.0000 mg | ORAL_CAPSULE | Freq: Three times a day (TID) | ORAL | Status: DC | PRN

## 2015-01-23 NOTE — Progress Notes (Signed)
Subjective:  Presents for recheck on her constipation predominant IBS which has been chronic. Taking daily OTC probiotics. Rare loose stool, more constipation. Has started taking Bentyl 10 mg that she has at home which is out of date which helps her cramping. Taking it once daily. Stop tramadol that she was taking for her fibromyalgia due to agitation and side effects.  Objective:   BP 118/82 mmHg  Ht 5\' 5"  (1.651 m)  Wt 200 lb 9.6 oz (90.992 kg)  BMI 33.38 kg/m2 NAD. Alert, oriented. Lungs clear. Heart regular rate rhythm. Abdomen soft nondistended nontender. No obvious masses.  Assessment:  Problem List Items Addressed This Visit      Digestive   IRRITABLE BOWEL SYNDROME - Primary   Relevant Medications   dicyclomine (BENTYL) 10 MG capsule     Musculoskeletal and Integument   Fibromyalgia     Plan:  Meds ordered this encounter  Medications  . dicyclomine (BENTYL) 10 MG capsule    Sig: Take 1 capsule (10 mg total) by mouth 3 (three) times daily as needed. For stomach cramps    Dispense:  90 capsule    Refill:  5    Order Specific Question:  Supervising Provider    Answer:  Mikey Kirschner [[id number]]   Also recommend: Low FODMAP diet Switch to Align once her current probiotic his finished 2 cups Activia yogurt Discussed nonmedical measures that may help her fibromyalgia. Recheck here as needed.

## 2015-01-23 NOTE — Patient Instructions (Addendum)
Low FODMAP Align 2 cups Activia yogurt

## 2015-02-05 ENCOUNTER — Encounter: Payer: Self-pay | Admitting: Internal Medicine

## 2015-02-06 ENCOUNTER — Ambulatory Visit (HOSPITAL_COMMUNITY)
Admission: RE | Admit: 2015-02-06 | Discharge: 2015-02-06 | Disposition: A | Discharge status: Home / Self Care | Payer: PPO | Source: Ambulatory Visit | Attending: Family Medicine | Admitting: Family Medicine

## 2015-02-06 DIAGNOSIS — Z1231 Encounter for screening mammogram for malignant neoplasm of breast: Secondary | ICD-10-CM | POA: Insufficient documentation

## 2015-02-08 ENCOUNTER — Ambulatory Visit (INDEPENDENT_AMBULATORY_CARE_PROVIDER_SITE_OTHER): Payer: PPO | Admitting: Family Medicine

## 2015-02-08 ENCOUNTER — Encounter: Payer: Self-pay | Admitting: Family Medicine

## 2015-02-08 VITALS — BP 130/74 | Temp 98.3°F | Ht 65.0 in | Wt 202.0 lb

## 2015-02-08 DIAGNOSIS — H8110 Benign paroxysmal vertigo, unspecified ear: Secondary | ICD-10-CM

## 2015-02-08 NOTE — Progress Notes (Signed)
   Subjective:    [name], female    DOB: 06-11-1940, 74 y.o.   MRN: [id number] Patient arrives office with substantial concerns.  Patient was worried she may be having signs of potential stroke next   HPIDizziness, headache,  and nausea yesterday. Lasted about 4 hours. On further history he has had in her ear challenges in the past.  All of these symptoms came on after the busy few hours of doing a lot of housework around her home. She did do a lot of changing of position of her head and neck during this time.  Today no significant headache. Very slight unsteadiness when she walks notes overall improved.   No focal TIA symptomatology.  Patient does have some risk factors with her age and hyperlipidemia.  No tinnitus no ear pain. No fever no cough no congestion  Nausea and queazzy with it Unsteady and trouble   No re ent cough cong dranage   Not exactly right this norn     Review of Systems No vomiting no diarrhea no chest pain no back pain no abdominal pain    Objective:   Physical Exam   alert no acute distress. TMs normal pharynx normal neck supple. Lungs clear. Heart regular rate and rhythm. Neuro exam intact. Cerebellar function perfect. No focal neurological deficits.      Assessment & Plan:  Impression vertigo-like symptoms highly unlikely due to central cause such as stroke TIA etc. Most likely in her ear. Discussed at length. Plan warning signs discussed. Meclizine when necessary for symptoms. Patient to try over-the-counter ensues may need further intervention down the road if this becomes chronic also discussed at length 25 minutes spent most in discussion WSL

## 2015-02-20 ENCOUNTER — Telehealth: Payer: Self-pay | Admitting: Family Medicine

## 2015-02-20 ENCOUNTER — Other Ambulatory Visit (INDEPENDENT_AMBULATORY_CARE_PROVIDER_SITE_OTHER): Payer: Self-pay | Admitting: *Deleted

## 2015-02-20 DIAGNOSIS — Z86010 Personal history of colonic polyps: Secondary | ICD-10-CM

## 2015-02-20 NOTE — Telephone Encounter (Signed)
Left message to return call 

## 2015-02-20 NOTE — Telephone Encounter (Signed)
Results discussed with patient- Patient advised mamo normal repeat in 1 yr- the hospital said they would notify pt of normal result with a letter- that is why we didn't call at that time- apparently they did not send the letter. Patient verbalized understanding.

## 2015-02-20 NOTE — Telephone Encounter (Signed)
mamo normal repeat in 1 yr- the hospital said they would notify pt of normal result with a letter- that is why we didn't call at that time- apparent;ly they did not send the letter

## 2015-02-20 NOTE — Telephone Encounter (Signed)
Pt would like her results to her mammo please  Done 12/23

## 2015-02-25 ENCOUNTER — Inpatient Hospital Stay (HOSPITAL_COMMUNITY): Admission: RE | Admit: 2015-02-25 | Payer: PPO | Source: Ambulatory Visit | Admitting: Orthopedic Surgery

## 2015-02-25 ENCOUNTER — Encounter (HOSPITAL_COMMUNITY): Admission: RE | Payer: Self-pay | Source: Ambulatory Visit

## 2015-02-25 SURGERY — ARTHROPLASTY, KNEE, TOTAL
Anesthesia: Spinal | Site: Knee | Laterality: Left

## 2015-03-05 ENCOUNTER — Telehealth: Payer: Self-pay | Admitting: Family Medicine

## 2015-03-05 MED ORDER — HYDROCODONE-ACETAMINOPHEN 5-325 MG PO TABS: 1.0000 | ORAL_TABLET | Freq: Two times a day (BID) | ORAL | Status: DC | PRN

## 2015-03-05 NOTE — Telephone Encounter (Signed)
Pt is requesting a refill on her HYDROcodone-acetaminophen (NORCO/VICODIN) 5-325 MG tablet  

## 2015-03-05 NOTE — Telephone Encounter (Signed)
Rx up front for patient pick up. Patient notified and scheduled a follow up office visit.

## 2015-03-05 NOTE — Telephone Encounter (Signed)
She may have a prescription for 30 tablets, she will need an office visit before additional prescriptions. Due to being a class II controlled medicine.

## 2015-03-05 NOTE — Telephone Encounter (Signed)
Last filled via phone message 12/2014 for #60

## 2015-03-18 ENCOUNTER — Ambulatory Visit: Payer: PPO | Admitting: Family Medicine

## 2015-03-19 ENCOUNTER — Ambulatory Visit (INDEPENDENT_AMBULATORY_CARE_PROVIDER_SITE_OTHER): Payer: PPO | Admitting: Family Medicine

## 2015-03-19 ENCOUNTER — Encounter: Payer: Self-pay | Admitting: Family Medicine

## 2015-03-19 VITALS — BP 122/82 | Ht 65.0 in | Wt 208.4 lb

## 2015-03-19 DIAGNOSIS — E785 Hyperlipidemia, unspecified: Secondary | ICD-10-CM

## 2015-03-19 DIAGNOSIS — F339 Major depressive disorder, recurrent, unspecified: Secondary | ICD-10-CM | POA: Diagnosis not present

## 2015-03-19 DIAGNOSIS — M1990 Unspecified osteoarthritis, unspecified site: Secondary | ICD-10-CM

## 2015-03-19 DIAGNOSIS — M797 Fibromyalgia: Secondary | ICD-10-CM

## 2015-03-19 DIAGNOSIS — F329 Major depressive disorder, single episode, unspecified: Secondary | ICD-10-CM | POA: Insufficient documentation

## 2015-03-19 MED ORDER — HYDROCODONE-ACETAMINOPHEN 5-325 MG PO TABS: 1.0000 | ORAL_TABLET | Freq: Two times a day (BID) | ORAL | Status: DC | PRN

## 2015-03-19 MED ORDER — FLUOXETINE HCL 20 MG PO TABS: ORAL_TABLET | ORAL | Status: DC

## 2015-03-19 MED ORDER — MOMETASONE FUROATE 0.1 % EX CREA: 1.0000 "application " | TOPICAL_CREAM | Freq: Two times a day (BID) | CUTANEOUS | Status: DC

## 2015-03-19 NOTE — Progress Notes (Signed)
   Subjective:    [name], female    DOB: 01-17-1941, 75 y.o.   MRN: [id number]  HPI This patient was seen today for chronic pain  The medication list was reviewed and updated.   -Compliance with medication: yes  - Number patient states they take daily: was taking 2 a day but decreased dose due to it making her feel down but pain is worse  -when was the last dose patient took? yest  The patient was advised the importance of maintaining medication and not using illegal substances with these.  Refills needed: yes  The patient was educated that we can provide 3 monthly scripts for their medication, it is their responsibility to follow the instructions.  Side effects or complications from medications: none Patient is aware that pain medications are meant to minimize the severity of the pain to allow their pain levels to improve to allow for better function. They are aware of that pain medications cannot totally remove their pain.  Due for UDT ( at least once per year) :   Patient has rash in hairline  Patient relates significant depression not suicidal she states she has a difficult time going back and forth between considering medications or not. She is willing to try Prozac. She does not take cholesterol medicine. Sometimes she does sometimes she doesn't she struggles with this.   Review of Systems She denies any chest tightness pressure pain shortness breath nausea vomiting diarrhea    Objective:   Physical Exam Neck no masses lungs are clear no crackles heart regular extremities no edema skin warm dry She has erythematous area along the right side of her neck approximately 3 mm x 5 mm does not appear cancerous appears to be similar to a contact dermatitis  25 minutes was spent with the patient. Greater than half the time was spent in discussion and answering questions and counseling regarding the issues that the patient came in for today. Patient follow-up  in 3 months    Assessment & Plan:  Fibromyalgia-regular exercise recommended Tylenol when necessary Significant depression-Prozac 20 mg daily increase it to 2 daily Arthritis-uses glucosamine seems to do well occasionally anti-inflammatories

## 2015-03-28 ENCOUNTER — Encounter (INDEPENDENT_AMBULATORY_CARE_PROVIDER_SITE_OTHER): Payer: Self-pay | Admitting: *Deleted

## 2015-03-28 ENCOUNTER — Telehealth (INDEPENDENT_AMBULATORY_CARE_PROVIDER_SITE_OTHER): Payer: Self-pay | Admitting: *Deleted

## 2015-03-28 ENCOUNTER — Other Ambulatory Visit (INDEPENDENT_AMBULATORY_CARE_PROVIDER_SITE_OTHER): Payer: Self-pay | Admitting: *Deleted

## 2015-03-28 MED ORDER — PEG 3350-KCL-NA BICARB-NACL 420 G PO SOLR: 4000.0000 mL | Freq: Once | ORAL | Status: DC

## 2015-03-28 NOTE — Telephone Encounter (Signed)
agree

## 2015-03-28 NOTE — Telephone Encounter (Signed)
Referring MD/PCP: sct luking   Procedure: tcs  Reason/Indication:  Hx polyps  Has patient had this procedure before?  Yes, 2013 -- epic  If so, when, by whom and where?    Is there a family history of colon cancer?  no  Who?  What age when diagnosed?    Is patient diabetic?   no      Does patient have prosthetic heart valve or mechanical valve?  no  Do you have a pacemaker?  no  Has patient ever had endocarditis? no  Has patient had joint replacement within last 12 months?  no  Does patient tend to be constipated or take laxatives? yes  Does patient have a history of alcohol/drug use?  no  Is patient on Coumadin, Plavix and/or Aspirin? no  Medications: see epic  Allergies: see epic  Medication Adjustment:   Procedure date & time: 04/18/15 at 830

## 2015-03-28 NOTE — Telephone Encounter (Signed)
Patient needs trilyte 

## 2015-04-02 ENCOUNTER — Telehealth: Payer: Self-pay | Admitting: Family Medicine

## 2015-04-02 NOTE — Telephone Encounter (Signed)
Patient is having extreme fatigue off of the FLUoxetine (PROZAC) 20 MG tablet.  She said that her fatigue from the fibromyalgia intensified since she started taking the medication.  Please advise.

## 2015-04-02 NOTE — Telephone Encounter (Signed)
This is a extremely nice patient. She is tried multiple medicines without success. I am not certain I will be able to find one that will be successful. She can stop the Prozac. It is reasonable to try Cymbalta we could start at a low dose 30 mg 1 daily. #30. 3 refills. Follow-up in several weeks. If patient states she cannot take this then I would highly recommend she allow [place] to set her up with a mental health specialist who might be able to help finding the medicine that would benefit.

## 2015-04-03 MED ORDER — DULOXETINE HCL 30 MG PO CPEP: 30.0000 mg | ORAL_CAPSULE | Freq: Every day | ORAL | Status: DC

## 2015-04-03 NOTE — Telephone Encounter (Signed)
Notified patient she can stop the Prozac. It is reasonable to try Cymbalta we could start at a low dose 30 mg 1 daily. #30. 3 refills. Follow-up in several weeks. If patient states she cannot take this then Dr. Nicki Reaper would highly recommend she allow [place] to set her up with a mental health specialist who might be able to help finding the medicine that would benefit. Patient verbalized understanding. Med sent to pharmacy.

## 2015-04-09 ENCOUNTER — Encounter (INDEPENDENT_AMBULATORY_CARE_PROVIDER_SITE_OTHER): Payer: Self-pay | Admitting: *Deleted

## 2015-04-09 NOTE — Telephone Encounter (Signed)
This encounter was created in error - please disregard.

## 2015-04-18 ENCOUNTER — Ambulatory Visit (HOSPITAL_COMMUNITY)
Admission: RE | Admit: 2015-04-18 | Discharge: 2015-04-18 | Disposition: A | Discharge status: Home / Self Care | Payer: PPO | Source: Ambulatory Visit | Attending: Internal Medicine | Admitting: Internal Medicine

## 2015-04-18 ENCOUNTER — Encounter (HOSPITAL_COMMUNITY)
Admission: RE | Disposition: A | Discharge status: Home / Self Care | Payer: Self-pay | Source: Ambulatory Visit | Attending: Internal Medicine

## 2015-04-18 ENCOUNTER — Encounter (HOSPITAL_COMMUNITY): Payer: Self-pay | Admitting: *Deleted

## 2015-04-18 DIAGNOSIS — Z801 Family history of malignant neoplasm of trachea, bronchus and lung: Secondary | ICD-10-CM | POA: Diagnosis not present

## 2015-04-18 DIAGNOSIS — F329 Major depressive disorder, single episode, unspecified: Secondary | ICD-10-CM | POA: Diagnosis not present

## 2015-04-18 DIAGNOSIS — G4733 Obstructive sleep apnea (adult) (pediatric): Secondary | ICD-10-CM | POA: Diagnosis not present

## 2015-04-18 DIAGNOSIS — D123 Benign neoplasm of transverse colon: Secondary | ICD-10-CM | POA: Insufficient documentation

## 2015-04-18 DIAGNOSIS — M797 Fibromyalgia: Secondary | ICD-10-CM | POA: Insufficient documentation

## 2015-04-18 DIAGNOSIS — Z79899 Other long term (current) drug therapy: Secondary | ICD-10-CM | POA: Insufficient documentation

## 2015-04-18 DIAGNOSIS — M1991 Primary osteoarthritis, unspecified site: Secondary | ICD-10-CM | POA: Insufficient documentation

## 2015-04-18 DIAGNOSIS — Z8371 Family history of colonic polyps: Secondary | ICD-10-CM | POA: Insufficient documentation

## 2015-04-18 DIAGNOSIS — K6389 Other specified diseases of intestine: Secondary | ICD-10-CM | POA: Diagnosis not present

## 2015-04-18 DIAGNOSIS — K219 Gastro-esophageal reflux disease without esophagitis: Secondary | ICD-10-CM | POA: Diagnosis not present

## 2015-04-18 DIAGNOSIS — F419 Anxiety disorder, unspecified: Secondary | ICD-10-CM | POA: Diagnosis not present

## 2015-04-18 DIAGNOSIS — E785 Hyperlipidemia, unspecified: Secondary | ICD-10-CM | POA: Diagnosis not present

## 2015-04-18 DIAGNOSIS — Z86010 Personal history of colonic polyps: Secondary | ICD-10-CM

## 2015-04-18 DIAGNOSIS — K648 Other hemorrhoids: Secondary | ICD-10-CM | POA: Diagnosis not present

## 2015-04-18 DIAGNOSIS — K644 Residual hemorrhoidal skin tags: Secondary | ICD-10-CM | POA: Insufficient documentation

## 2015-04-18 DIAGNOSIS — Z1211 Encounter for screening for malignant neoplasm of colon: Secondary | ICD-10-CM | POA: Diagnosis not present

## 2015-04-18 HISTORY — PX: COLONOSCOPY: SHX5424

## 2015-04-18 SURGERY — COLONOSCOPY
Anesthesia: Moderate Sedation

## 2015-04-18 MED ORDER — MEPERIDINE HCL 50 MG/ML IJ SOLN
INTRAMUSCULAR | Status: DC | PRN
  Administered 2015-04-18 (×2): 25 mg via INTRAVENOUS

## 2015-04-18 MED ORDER — MEPERIDINE HCL 50 MG/ML IJ SOLN
INTRAMUSCULAR | Status: AC
  Filled 2015-04-18: qty 1

## 2015-04-18 MED ORDER — MIDAZOLAM HCL 5 MG/5ML IJ SOLN
INTRAMUSCULAR | Status: AC
  Filled 2015-04-18: qty 10

## 2015-04-18 MED ORDER — SODIUM CHLORIDE 0.9 % IV SOLN
INTRAVENOUS | Status: DC
  Administered 2015-04-18: 1000 mL via INTRAVENOUS

## 2015-04-18 MED ORDER — PROMETHAZINE HCL 25 MG/ML IJ SOLN
INTRAMUSCULAR | Status: DC | PRN
  Administered 2015-04-18: 12.5 mg via INTRAVENOUS

## 2015-04-18 MED ORDER — STERILE WATER FOR IRRIGATION IR SOLN
Status: DC | PRN
  Administered 2015-04-18: 4 mL

## 2015-04-18 MED ORDER — MIDAZOLAM HCL 5 MG/5ML IJ SOLN
INTRAMUSCULAR | Status: DC | PRN
  Administered 2015-04-18 (×3): 2 mg via INTRAVENOUS

## 2015-04-18 MED ORDER — PROMETHAZINE HCL 25 MG/ML IJ SOLN
INTRAMUSCULAR | Status: AC
  Filled 2015-04-18: qty 1

## 2015-04-18 NOTE — Op Note (Signed)
COLONOSCOPY PROCEDURE REPORT  PATIENT:  Ruth [name]  MR#:  [id number] Birthdate:  [birth year], 75 y.o., female Endoscopist:  Dr. Rogene Houston, MD Referred By:  Dr. Sallee Lange, MD Procedure Date: 04/18/2015  Procedure:   Colonoscopy  Indications: Patient is 75 year old Caucasian female with history of multiple colonic adenomas who is here for surveillance colonoscopy. Last exam was in December 2013.  Informed Consent:  The procedure and risks were reviewed with the patient and informed consent was obtained.  Medications:  Demerol 50 mg IV Versed 6 mg IV Promethazine 25 mg IV and diluted form.  First dose administered at 0900 Last dose administered at 0907  Description of procedure:  After a digital rectal exam was performed, that colonoscope was advanced from the anus through the rectum and colon to the area of the cecum, ileocecal valve and appendiceal orifice. The cecum was deeply intubated. These structures were well-seen and photographed for the record. From the level of the cecum and ileocecal valve, the scope was slowly and cautiously withdrawn. The mucosal surfaces were carefully surveyed utilizing scope tip to flexion to facilitate fold flattening as needed. The scope was pulled down into the rectum where a thorough exam including retroflexion was performed.  Findings:   Prep excellent. Two small polyps ablated via cold biopsy from hepatic flexure and submitted together. Mucosa of rest of the colon was normal. Normal rectal mucosa. Two anal papillae and small hemorrhoids below the dentate line.     Therapeutic/Diagnostic Maneuvers Performed:  See above  Complications:  None  EBL:  Minimal  Cecal Withdrawal Time:  12 minutes  Impression:  Examination performed to cecum. Two small polyps ablated via cold biopsy from hepatic flexure and submitted together.. Small external hemorrhoids and two anal papillae.  Recommendations:  Standard instructions given. I  will contact patient with biopsy results. She can wait 5 years before her next colonoscopy.  [name] U  04/18/2015 9:34 AM  CC: Dr. Sallee Lange, MD & Dr. Rayne Du ref. provider found

## 2015-04-18 NOTE — Discharge Instructions (Signed)
Resume usual medications and diet. No driving for 24 hours. Physician will call with biopsy results. Next colonoscopy in 5 years.     Colon Polyps Polyps are lumps of extra tissue growing inside the body. Polyps can grow in the large intestine (colon). Most colon polyps are noncancerous (benign). However, some colon polyps can become cancerous over time. Polyps that are larger than a pea may be harmful. To be safe, caregivers remove and test all polyps. CAUSES  Polyps form when mutations in the genes cause your cells to grow and divide even though no more tissue is needed. RISK FACTORS There are a number of risk factors that can increase your chances of getting colon polyps. They include:  Being older than 50 years.  Family history of colon polyps or colon cancer.  Long-term colon diseases, such as colitis or Crohn disease.  Being overweight.  Smoking.  Being inactive.  Drinking too much alcohol. SYMPTOMS  Most small polyps do not cause symptoms. If symptoms are present, they may include:  Blood in the stool. The stool may look dark red or black.  Constipation or diarrhea that lasts longer than 1 week. DIAGNOSIS People often do not know they have polyps until their caregiver finds them during a regular checkup. Your caregiver can use 4 tests to check for polyps:  Digital rectal exam. The caregiver wears gloves and feels inside the rectum. This test would find polyps only in the rectum.  Barium enema. The caregiver puts a liquid called barium into your rectum before taking X-rays of your colon. Barium makes your colon look white. Polyps are dark, so they are easy to see in the X-ray pictures.  Sigmoidoscopy. A thin, flexible tube (sigmoidoscope) is placed into your rectum. The sigmoidoscope has a light and tiny camera in it. The caregiver uses the sigmoidoscope to look at the last third of your colon.  Colonoscopy. This test is like sigmoidoscopy, but the caregiver looks at  the entire colon. This is the most common method for finding and removing polyps. TREATMENT  Any polyps will be removed during a sigmoidoscopy or colonoscopy. The polyps are then tested for cancer. PREVENTION  To help lower your risk of getting more colon polyps:  Eat plenty of fruits and vegetables. Avoid eating fatty foods.  Do not smoke.  Avoid drinking alcohol.  Exercise every day.  Lose weight if recommended by your caregiver.  Eat plenty of calcium and folate. Foods that are rich in calcium include milk, cheese, and broccoli. Foods that are rich in folate include chickpeas, kidney beans, and spinach. HOME CARE INSTRUCTIONS Keep all follow-up appointments as directed by your caregiver. You may need periodic exams to check for polyps. SEEK MEDICAL CARE IF: You notice bleeding during a bowel movement.   This information is not intended to replace advice given to you by your health care provider. Make sure you discuss any questions you have with your health care provider.   Document Released: 10/30/2003 Document Revised: 02/23/2014 Document Reviewed: 04/14/2011 Elsevier Interactive Patient Education 2016 [employer]. Colonoscopy, Care After These instructions give you information on caring for yourself after your procedure. Your doctor may also give you more specific instructions. Call your doctor if you have any problems or questions after your procedure. HOME CARE  Do not drive for 24 hours.  Do not sign important papers or use machinery for 24 hours.  You may shower.  You may go back to your usual activities, but go slower for the first  24 hours.  Take rest breaks often during the first 24 hours.  Walk around or use warm packs on your belly (abdomen) if you have belly cramping or gas.  Drink enough fluids to keep your pee (urine) clear or pale yellow.  Resume your normal diet. Avoid heavy or fried foods.  Avoid drinking alcohol for 24 hours or as told by your  doctor.  Only take medicines as told by your doctor. If a tissue sample (biopsy) was taken during the procedure:   Do not take aspirin or blood thinners for 7 days, or as told by your doctor.  Do not drink alcohol for 7 days, or as told by your doctor.  Eat soft foods for the first 24 hours. GET HELP IF: You still have a small amount of blood in your poop (stool) 2-3 days after the procedure. GET HELP RIGHT AWAY IF:  You have more than a small amount of blood in your poop.  You see clumps of tissue (blood clots) in your poop.  Your belly is puffy (swollen).  You feel sick to your stomach (nauseous) or throw up (vomit).  You have a fever.  You have belly pain that gets worse and medicine does not help. MAKE SURE YOU:  Understand these instructions.  Will watch your condition.  Will get help right away if you are not doing well or get worse.   This information is not intended to replace advice given to you by your health care provider. Make sure you discuss any questions you have with your health care provider.   Document Released: 03/07/2010 Document Revised: 02/07/2013 Document Reviewed: 10/10/2012 Elsevier Interactive Patient Education [employer].

## 2015-04-18 NOTE — H&P (Signed)
Ruth [name] is an 75 y.o. female.   Chief Complaint: Patient is here for colonoscopy. HPI: Patient is 75 year old Caucasian female was history of colonic adenomas and is here for surveillance colonoscopy. She denies abdominal pain change in bowel habits or rectal bleeding. Last exam was in December 2013 with removal of multiple adenomas. Family history significant for colonic polyps in mother and 2 sisters. One sister is deceased due to unrelated causes.  Past Medical History  Diagnosis Date  . Anxiety   . GERD (gastroesophageal reflux disease)     EGD/ colon 1/09  . Hyperlipidemia   . OSA (obstructive sleep apnea)   . IBS (irritable bowel syndrome)   . Arthritis   . Depression   . Fibromyalgia   . Refusal of blood transfusions as patient is Jehovah's Witness   . H pylori ulcer 1980-1990    s/p treatment    Past Surgical History  Procedure Laterality Date  . Cardiac catheterization  2002  . Bilateral salpingoophorectomy  1990s  . Appendectomy    . Knee surgery      Multiple  . Tubal ligation    . Inner ear surgery      Left  . Tonsillectomy    . Abdominal hysterectomy  1970  . Colonoscopy  01/2007    Dr. Meriel Flavors  . Esophagogastroduodenoscopy  01/2007    Dr Sharlett Iles- 3 cm hiatal hernia, benign esophageal biopsies, erosive esophagitis, gastritis  . Shoulder surgery  2011  . Colonoscopy with esophagogastroduodenoscopy (egd)  02/16/2012    Procedure: COLONOSCOPY WITH ESOPHAGOGASTRODUODENOSCOPY (EGD);  Surgeon: Daneil Dolin, MD;  Location: [facility];  Service: Endoscopy;  Laterality: N/A;  8:45  . Esophagogastroduodenoscopy N/A 06/07/2014    Procedure: ESOPHAGOGASTRODUODENOSCOPY (EGD);  Surgeon: Rogene Houston, MD;  Location: [facility];  Service: Endoscopy;  Laterality: N/A;  1200  . Esophageal dilation N/A 06/07/2014    Procedure: ESOPHAGEAL DILATION;  Surgeon: Rogene Houston, MD;  Location: [facility];  Service: Endoscopy;  Laterality: N/A;     Family History  Problem Relation Age of Onset  . Diabetes Father   . Heart attack Father   . Lung cancer Father   . Cirrhosis Father 35    etoh cirrhosis  . Lung cancer Sister   . Depression Sister   . Hypertension Sister   . Diabetes Sister   . Lung cancer Sister   . Paranoid behavior Mother     depression  . Colon polyps Mother    Social History:  reports that she has never smoked. She has never used smokeless tobacco. She reports that she drinks alcohol. She reports that she does not use illicit drugs.  Allergies:  Allergies  Allergen Reactions  . Blood-Group Specific Substance     NO BLOOD PRODUCTS  . Ciprofloxacin Other (See Comments)    Pt reports extreme fatigue  . Codeine Nausea And Vomiting    Medications Prior to Admission  Medication Sig Dispense Refill  . b complex vitamins tablet Take 1 tablet by mouth daily.      Marland Kitchen CALCIUM PO Take 1 tablet by mouth daily.     . Cholecalciferol (VITAMIN D) 2000 UNITS CAPS Take by mouth. Vitamin D3 2000 units    . Coenzyme Q10 (CO Q 10) 10 MG CAPS Take 1 capsule by mouth daily.      . diazepam (VALIUM) 5 MG tablet Take 5 mg by mouth daily as needed for muscle spasms.    Marland Kitchen FLUoxetine (  PROZAC) 20 MG tablet 2 qd (Patient taking differently: Take 40 mg by mouth daily. ) 60 tablet 5  . HYDROcodone-acetaminophen (NORCO/VICODIN) 5-325 MG tablet Take 1 tablet by mouth 2 (two) times daily as needed for moderate pain. 60 tablet 0  . KRILL OIL PO Take 1 tablet by mouth daily.     Marland Kitchen MAGNESIUM PO Take 1,300 mg by mouth daily.     . polyethylene glycol-electrolytes (NULYTELY/GOLYTELY) 420 g solution Take 4,000 mLs by mouth once. 4000 mL 0  . Probiotic Product (PROBIOTIC DAILY PO) Take 1 capsule by mouth.     . ranitidine (ZANTAC) 300 MG tablet Take 1 tablet (300 mg total) by mouth at bedtime. (Patient taking differently: Take 300 mg by mouth at bedtime as needed for heartburn. ) 30 tablet 5  . vitamin B-12 (CYANOCOBALAMIN) 100 MCG tablet  Take 100 mcg by mouth daily.    . vitamin C (ASCORBIC ACID) 500 MG tablet Take 500-1,000 mg by mouth daily.      Marland Kitchen acetaminophen (TYLENOL) 500 MG tablet Take 1,000 mg by mouth 3 (three) times daily as needed for moderate pain. For pain     . albuterol (PROVENTIL HFA;VENTOLIN HFA) 108 (90 BASE) MCG/ACT inhaler Inhale 2 puffs into the lungs every 6 (six) hours as needed for wheezing. 1 Inhaler 2  . dicyclomine (BENTYL) 10 MG capsule Take 1 capsule (10 mg total) by mouth 3 (three) times daily as needed. For stomach cramps 90 capsule 5  . DULoxetine (CYMBALTA) 30 MG capsule Take 1 capsule (30 mg total) by mouth daily. (Patient not taking: Reported on 04/04/2015) 30 capsule 3  . FLOVENT HFA 110 MCG/ACT inhaler Inhale 1 puff into the lungs 2 (two) times daily as needed (shortness of breath).   0  . glucosamine-chondroitin 500-400 MG tablet Take 1 tablet by mouth daily as needed (pain).     . mometasone (ELOCON) 0.1 % cream Apply 1 application topically 2 (two) times daily. 45 g 2    No results found for this or any previous visit (from the past 48 hour(s)). No results found.  ROS  Blood pressure 109/69, pulse 89, temperature 97.7 F (36.5 C), temperature source Oral, resp. rate 16, height 5\' 5"  (1.651 m), weight 208 lb (94.348 kg), SpO2 99 %. Physical Exam  Constitutional: She appears well-developed and well-nourished.  HENT:  Mouth/Throat: Oropharynx is clear and moist.  Eyes: Conjunctivae are normal. No scleral icterus.  Neck: No thyromegaly present.  Cardiovascular: Normal rate, regular rhythm and normal heart sounds.   No murmur heard. Respiratory: Effort normal and breath sounds normal.  GI: Soft. She exhibits no distension and no mass. There is no tenderness.  Musculoskeletal: She exhibits no edema.  Lymphadenopathy:    She has no cervical adenopathy.  Neurological: She is alert.  Skin: Skin is warm and dry.     Assessment/Plan History of colonic adenomas. Surveillance  colonoscopy.  Rogene Houston, MD 04/18/2015, 8:55 AM

## 2015-04-22 ENCOUNTER — Encounter (HOSPITAL_COMMUNITY): Payer: Self-pay | Admitting: Internal Medicine

## 2015-05-16 ENCOUNTER — Ambulatory Visit (INDEPENDENT_AMBULATORY_CARE_PROVIDER_SITE_OTHER): Payer: PPO | Admitting: Family Medicine

## 2015-05-16 ENCOUNTER — Encounter: Payer: Self-pay | Admitting: Family Medicine

## 2015-05-16 VITALS — BP 136/72 | Temp 98.1°F | Ht 65.0 in | Wt 211.0 lb

## 2015-05-16 DIAGNOSIS — M67472 Ganglion, left ankle and foot: Secondary | ICD-10-CM

## 2015-05-16 MED ORDER — PREDNISONE 10 MG PO TABS: ORAL_TABLET | ORAL | Status: DC

## 2015-05-16 NOTE — Progress Notes (Signed)
   Subjective:    [name], female    DOB: [birth year], 75 y.o.   MRN: [id number]  Foot Pain This is a new problem. The current episode started yesterday. The problem occurs constantly. Associated symptoms include abdominal pain. The symptoms are aggravated by standing and walking. She has tried nothing for the symptoms.  Abdominal Pain This is a new problem. The current episode started more than 1 month ago. The problem occurs intermittently. The pain is located in the generalized abdominal region. The quality of the pain is sharp and cramping (Grabbing pain).   on further history patient's had abdominal discomfort off and on 4 months. I advised her she needs to take this up with Dr. Nicki [name] in the chronic visit.    Pain and tenderness and dificulty with foot. Feels a swelling that she didn't think was here. On further history she wore flip-flops wore them all day yesterday. This was new for her.    Review of Systems  Gastrointestinal: Positive for abdominal pain.       Objective:   Physical Exam   alert vitals stable mild distress when foot pressed on HEENT normal. Lungs clear heart rare rhythm. Left great toe swelling at base of proximal digit exquisitely tender      Assessment & Plan:   impression cyst toe discussed plan podiatry referral /short-term prednisone taper for pain control because very tender. I think this is likely a chronic cyst was inflamed yesterday while wearing foot flops

## 2015-05-20 ENCOUNTER — Encounter: Payer: Self-pay | Admitting: Family Medicine

## 2015-05-20 ENCOUNTER — Ambulatory Visit (INDEPENDENT_AMBULATORY_CARE_PROVIDER_SITE_OTHER): Payer: PPO | Admitting: Family Medicine

## 2015-05-20 VITALS — BP 120/68 | Temp 98.4°F | Ht 65.0 in | Wt 212.1 lb

## 2015-05-20 DIAGNOSIS — H9201 Otalgia, right ear: Secondary | ICD-10-CM | POA: Diagnosis not present

## 2015-05-20 DIAGNOSIS — H6981 Other specified disorders of Eustachian tube, right ear: Secondary | ICD-10-CM

## 2015-05-20 DIAGNOSIS — B9689 Other specified bacterial agents as the cause of diseases classified elsewhere: Secondary | ICD-10-CM

## 2015-05-20 DIAGNOSIS — J019 Acute sinusitis, unspecified: Secondary | ICD-10-CM | POA: Diagnosis not present

## 2015-05-20 MED ORDER — AMOXICILLIN 500 MG PO TABS: 500.0000 mg | ORAL_TABLET | Freq: Three times a day (TID) | ORAL | Status: DC

## 2015-05-20 NOTE — Progress Notes (Signed)
   Subjective:    [name], female    DOB: 10-31-1940, 75 y.o.   MRN: [id number]  Otalgia  There is pain in the right ear. This is a new problem. The current episode started today. The problem has been unchanged. There has been no fever. The pain is moderate. Associated symptoms include headaches. She has tried nothing for the symptoms. The treatment provided no relief.   Patient states that she has no other concerns at this time.  Patient states right side of her head aching sinuses aching feels like she is talking in a drum denies high fever chills sweats  Review of Systems  HENT: Positive for ear pain.   Neurological: Positive for headaches.       Objective:   Physical Exam  Eardrums normal right side sinuses mild tenderness throat normal lungs clear      Assessment & Plan:  Viral syndrome Secondary rhinosinusitis Right ear referred pain from the sinuses Antibiotics prescribed warning signs discuss

## 2015-05-21 ENCOUNTER — Telehealth: Payer: Self-pay | Admitting: Family Medicine

## 2015-05-21 MED ORDER — SULFACETAMIDE SODIUM 10 % OP SOLN: OPHTHALMIC | Status: DC

## 2015-05-21 NOTE — Telephone Encounter (Signed)
Patient was seen yesterday and mentioned something about her eyes itching her, but last night they got much redder, itchy and irritated, no drainage so far. She took her granddaughter to the urgent care on Saturday for pink eye.  She wants to know if we can call her something in?  [place]

## 2015-05-21 NOTE — Telephone Encounter (Signed)
Spoke with patient to clarify symptoms. Patient stated that her bilateral eyes are itching, with redness and irration. No drainage noted at this time. Has been exposed to pink eye. No allergy to sulfur. Sending in Sulfacetamide opthalmic drops per protocol to patient's requested pharmacy.

## 2015-06-04 DIAGNOSIS — H906 Mixed conductive and sensorineural hearing loss, bilateral: Secondary | ICD-10-CM | POA: Diagnosis not present

## 2015-06-04 DIAGNOSIS — H6983 Other specified disorders of Eustachian tube, bilateral: Secondary | ICD-10-CM | POA: Diagnosis not present

## 2015-06-04 DIAGNOSIS — H6123 Impacted cerumen, bilateral: Secondary | ICD-10-CM | POA: Diagnosis not present

## 2015-06-05 ENCOUNTER — Ambulatory Visit (INDEPENDENT_AMBULATORY_CARE_PROVIDER_SITE_OTHER): Payer: PPO | Admitting: Podiatry

## 2015-06-05 ENCOUNTER — Ambulatory Visit (INDEPENDENT_AMBULATORY_CARE_PROVIDER_SITE_OTHER): Payer: PPO

## 2015-06-05 ENCOUNTER — Encounter: Payer: Self-pay | Admitting: Podiatry

## 2015-06-05 VITALS — BP 119/64 | HR 74 | Resp 16

## 2015-06-05 DIAGNOSIS — M2040 Other hammer toe(s) (acquired), unspecified foot: Secondary | ICD-10-CM

## 2015-06-05 DIAGNOSIS — M79672 Pain in left foot: Secondary | ICD-10-CM

## 2015-06-05 DIAGNOSIS — M79671 Pain in right foot: Secondary | ICD-10-CM | POA: Diagnosis not present

## 2015-06-05 DIAGNOSIS — L723 Sebaceous cyst: Secondary | ICD-10-CM | POA: Diagnosis not present

## 2015-06-05 NOTE — Progress Notes (Signed)
   Subjective:    [name], female    DOB: 11/04/1940, 75 y.o.   MRN: [id number]  HPI    Review of Systems  Constitutional: Positive for fatigue.  HENT: Positive for sinus pressure and sneezing.   Respiratory: Positive for cough and shortness of breath.   Endocrine: Positive for polydipsia.  Musculoskeletal: Positive for back pain, arthralgias and gait problem.  Allergic/Immunologic: Positive for environmental allergies.  Psychiatric/Behavioral: The patient is nervous/anxious.   All other systems reviewed and are negative.      Objective:   Physical Exam        Assessment & Plan:

## 2015-06-05 NOTE — Progress Notes (Signed)
Subjective:     [name], female   DOB: 03-09-1940, 75 y.o.   MRN: [id number]  HPI patient presents stating I have this very painful nodule under my left big toe and I have very bad hammertoe deformities on both feet that make it hard to wear shoe gear or be comfortable   Review of Systems  All other systems reviewed and are negative.      Objective:   Physical Exam  Constitutional: She is oriented to person, place, and time.  Cardiovascular: Intact distal pulses.   Musculoskeletal: Normal range of motion.  Neurological: She is alert and oriented to person, place, and time.  Skin: Skin is warm.  Nursing note and vitals reviewed.  neurovascular status intact muscle strength adequate range of motion within normal limits with patient found to have a movable nodule at the plantar aspect of the left hallux metatarsal interface and a significant rigid hammertoe deformity second digit left second and third fourth digit right with hammertoe deformity fifth right. Has tried shoe gear modifications padding and oral steroids without relief of symptoms     Assessment:     Unknown nodule plantar aspect left first interspace of approximate 6 months duration with rigid hammertoe deformity digit 2 left and digits 23 and 4 on the right    Plan:     H&P and all conditions reviewed. We discussed at great length condition and patient is opted for surgical intervention and I've recommended excision of lesion plantar left with pathology along with digital fusion second toe left. She is due for knee replacement in July so that should fitting well and we can do the right foot later on in the year. I educated her on this and [place] wants to do consent form today so I allowed her to read consent form reviewing alternative treatments complications associated with this. Patient signed consent form after extensive review and is scheduled for outpatient surgery     X-ray  report indicates rigid hammertoe deformity digit 2 left digits 234 right with a small nodule left that was not calcified

## 2015-06-05 NOTE — Patient Instructions (Signed)
Pre-Operative Instructions  Congratulations, you have decided to take an important step to improving your quality of life.  You can be assured that the doctors of [facility] will be with you every step of the way.  1. Plan to be at the surgery center/hospital at least 1 (one) hour prior to your scheduled time unless otherwise directed by the surgical center/hospital staff.  You must have a responsible adult accompany you, remain during the surgery and drive you home.  Make sure you have directions to the surgical center/hospital and know how to get there on time. 2. For hospital based surgery you will need to obtain a history and physical form from your family physician within 1 month prior to the date of surgery- we will give you a form for you primary physician.  3. We make every effort to accommodate the date you request for surgery.  There are however, times where surgery dates or times have to be moved.  We will contact you as soon as possible if a change in schedule is required.   4. No Aspirin/Ibuprofen for one week before surgery.  If you are on aspirin, any non-steroidal anti-inflammatory medications (Mobic, Aleve, Ibuprofen) you should stop taking it 7 days prior to your surgery.  You make take Tylenol  For pain prior to surgery.  5. Medications- If you are taking daily heart and blood pressure medications, seizure, reflux, allergy, asthma, anxiety, pain or diabetes medications, make sure the surgery center/hospital is aware before the day of surgery so they may notify you which medications to take or avoid the day of surgery. 6. No food or drink after midnight the night before surgery unless directed otherwise by surgical center/hospital staff. 7. No alcoholic beverages 24 hours prior to surgery.  No smoking 24 hours prior to or 24 hours after surgery. 8. Wear loose pants or shorts- loose enough to fit over bandages, boots, and casts. 9. No slip on shoes, sneakers are best. 10. Bring  your boot with you to the surgery center/hospital.  Also bring crutches or a walker if your physician has prescribed it for you.  If you do not have this equipment, it will be provided for you after surgery. 11. If you have not been contracted by the surgery center/hospital by the day before your surgery, call to confirm the date and time of your surgery. 12. Leave-time from work may vary depending on the type of surgery you have.  Appropriate arrangements should be made prior to surgery with your employer. 13. Prescriptions will be provided immediately following surgery by your doctor.  Have these filled as soon as possible after surgery and take the medication as directed. 14. Remove nail polish on the operative foot. 15. Wash the night before surgery.  The night before surgery wash the foot and leg well with the antibacterial soap provided and water paying special attention to beneath the toenails and in between the toes.  Rinse thoroughly with water and dry well with a towel.  Perform this wash unless told not to do so by your physician.  Enclosed: 1 Ice pack (please put in freezer the night before surgery)   1 Hibiclens skin cleaner   Pre-op Instructions  If you have any questions regarding the instructions, do not hesitate to call our office.  [place]: [address] [place], [place] [address] [phone]  [place]: [address] [place], [place] [address] [phone]  [place]: [address]  [place], [place] [address] [phone]  Dr. Richard   Tuchman DPM, Dr. Norman Regal DPM Dr. Richard Sikora DPM, Dr. M. Todd Hyatt DPM, Dr. Kathryn Egerton DPM 

## 2015-06-06 ENCOUNTER — Telehealth: Payer: Self-pay | Admitting: *Deleted

## 2015-06-06 NOTE — Telephone Encounter (Signed)
I called and left her a message that Medicare will cover the procedures.  You are having Excision Ganglion / Tumor of the left foot and a Hammer Toe Repair 2nd w/ pin left foot.  Call if you have any further questions.

## 2015-06-06 NOTE — Telephone Encounter (Signed)
"  I have a couple of questions.  I'm scheduled for surgery in May.  I need to know if this is a Medicare covered procedure.  I also need to know what the name of the procedure is."

## 2015-06-10 ENCOUNTER — Telehealth: Payer: Self-pay | Admitting: Family Medicine

## 2015-06-10 DIAGNOSIS — R7303 Prediabetes: Secondary | ICD-10-CM

## 2015-06-10 NOTE — Telephone Encounter (Signed)
Patient is going to have her BW done soon, she wants to know if we can add to have her A1C checked.  Please advise.

## 2015-06-10 NOTE — Telephone Encounter (Signed)
Lad added. Patient notified.

## 2015-06-10 NOTE — Telephone Encounter (Signed)
Lab added. Patient was notified.

## 2015-06-11 DIAGNOSIS — E785 Hyperlipidemia, unspecified: Secondary | ICD-10-CM | POA: Diagnosis not present

## 2015-06-12 LAB — LIPID PANEL
Chol/HDL Ratio: 4.5 ratio units — ABNORMAL HIGH (ref 0.0–4.4)
Cholesterol, Total: 265 mg/dL — ABNORMAL HIGH (ref 100–199)
HDL: 59 mg/dL (ref >39)
LDL Calculated: 178 mg/dL — ABNORMAL HIGH (ref 0–99)
Triglycerides: 138 mg/dL (ref 0–149)
VLDL Cholesterol Cal: 28 mg/dL (ref 5–40)

## 2015-06-12 LAB — HEPATIC FUNCTION PANEL
ALT: 16 IU/L (ref 0–32)
AST: 13 IU/L (ref 0–40)
Albumin: 4.6 g/dL (ref 3.5–4.8)
Alkaline Phosphatase: 70 IU/L (ref 39–117)
Bilirubin Total: 0.3 mg/dL (ref 0.0–1.2)
Bilirubin, Direct: 0.09 mg/dL (ref 0.00–0.40)
Total Protein: 6.7 g/dL (ref 6.0–8.5)

## 2015-06-12 LAB — HGB A1C W/O EAG: Hgb A1c MFr Bld: 6.1 % — ABNORMAL HIGH (ref 4.8–5.6)

## 2015-06-14 ENCOUNTER — Telehealth: Payer: Self-pay | Admitting: *Deleted

## 2015-06-14 NOTE — Telephone Encounter (Signed)
"  I have surgery scheduled with Dr. Paulla Dolly for the 9th of May.  I'd like to discuss this with you.  Give me a call."  I'm returning your call.  "Please disregard my call.  My husband and I were discussing my surgery.  I was contemplating not doing the thing on the bottom of my foot because last time I had surgery on my shoulder my arthritis flared and I don't want that to happen.  But I decided surgery is surgery.  If I have surgery at all it's still has the possibility of flaring up.  So, just leave it as it is.  I still haven't heard from the surgery center about registering."  They will call you either this Friday or Monday.  "Okay, thanks for returning my call."

## 2015-06-17 ENCOUNTER — Ambulatory Visit (INDEPENDENT_AMBULATORY_CARE_PROVIDER_SITE_OTHER): Payer: PPO | Admitting: Family Medicine

## 2015-06-17 ENCOUNTER — Encounter: Payer: Self-pay | Admitting: Family Medicine

## 2015-06-17 VITALS — BP 112/72 | Ht 65.0 in | Wt 213.5 lb

## 2015-06-17 DIAGNOSIS — G729 Myopathy, unspecified: Secondary | ICD-10-CM

## 2015-06-17 DIAGNOSIS — M150 Primary generalized (osteo)arthritis: Secondary | ICD-10-CM | POA: Diagnosis not present

## 2015-06-17 DIAGNOSIS — M542 Cervicalgia: Secondary | ICD-10-CM | POA: Diagnosis not present

## 2015-06-17 DIAGNOSIS — M797 Fibromyalgia: Secondary | ICD-10-CM | POA: Diagnosis not present

## 2015-06-17 DIAGNOSIS — R51 Headache: Secondary | ICD-10-CM

## 2015-06-17 DIAGNOSIS — M8949 Other hypertrophic osteoarthropathy, multiple sites: Secondary | ICD-10-CM

## 2015-06-17 DIAGNOSIS — R7303 Prediabetes: Secondary | ICD-10-CM | POA: Diagnosis not present

## 2015-06-17 DIAGNOSIS — M159 Polyosteoarthritis, unspecified: Secondary | ICD-10-CM

## 2015-06-17 DIAGNOSIS — R519 Headache, unspecified: Secondary | ICD-10-CM

## 2015-06-17 MED ORDER — HYDROCODONE-ACETAMINOPHEN 5-325 MG PO TABS: 1.0000 | ORAL_TABLET | Freq: Two times a day (BID) | ORAL | Status: DC | PRN

## 2015-06-17 NOTE — Progress Notes (Signed)
   Subjective:    [name], female    DOB: 1940/10/20, 75 y.o.   MRN: [id number]  HPI This patient was seen today for chronic pain  The medication list was reviewed and updated.   -Compliance with medication: yes  - Number patient states they take daily: 2 daily mostly   -when was the last dose patient took: yesterday   The patient was advised the importance of maintaining medication and not using illegal substances with these.  Refills needed:yes  The patient was educated that we can provide 3 monthly scripts for their medication, it is their responsibility to follow the instructions.  Side effects or complications from medications: none  Patient is aware that pain medications are meant to minimize the severity of the pain to allow their pain levels to improve to allow for better function. They are aware of that pain medications cannot totally remove their pain.  Due for UDT ( at least once per year) :   Patient states that she needs to see neurology. She has a pain in the back of her head. Onset over 3 months ago.   We went over her lab work including prediabetes hyperlipidemia we went over dietary measures and exercise as well C/o pain and tingling behind the neck Pain into the back of the skull as well Patient relates a tingling electrical sensation behind the neck and into the shoulder blade region on the left Review of Systems Patient relates neck pain headaches back in the neck and her upper back patient also denies any chest tightness pressure pain shortness breath she does relate multiple joint pains also problems with fibromyalgia.    Objective:   Physical Exam  Upper portion of the neck in the posterior aspect subjective tenderness radiates into the back skull also upper back tenderness. Lungs are clear hearts regular pulse normal extremities no edema    25 minutes was spent with the patient. Greater than half the time was spent in discussion and  answering questions and counseling regarding the issues that the patient came in for today.  Assessment & Plan:  Cervical neck pain with radiation into the back of head causing severe headaches. Patient very frustrated with these headaches requests to see specialist we will help set her up with neurology. I believe neurology will be able to help this patient possibly do injections in her upper neck/occipital region to help with nerve root related pain. Patient maintained up needing to have MRI as well.  Chronic pain discomfort with back and multiple joints prescription for pain medicine given 3 separate scripts she will follow-up in several months for recheck  Severe hyperlipidemia patient cannot tolerate statins TES healthy and stay physically active lab work reviewed with patient  Prediabetes importance of healthy diet and weight loss recommended

## 2015-06-18 ENCOUNTER — Encounter: Payer: Self-pay | Admitting: Family Medicine

## 2015-06-18 NOTE — Progress Notes (Signed)
Order for referral put in.  

## 2015-06-18 NOTE — Addendum Note (Signed)
Addended by: Carmelina Noun on: 06/18/2015 08:14 AM   Modules accepted: Orders

## 2015-06-24 ENCOUNTER — Telehealth: Payer: Self-pay | Admitting: *Deleted

## 2015-06-24 NOTE — Telephone Encounter (Signed)
I'm returning your call.  "Yes, I'm calling to let him know the tumor has gone down tremendously and the pain has eased up.  I don't know if it's worth doing surgery on or not.  He had mentioned doing 4 hammer toes on the other foot.  If he doesn't do surgery on the tumor is that enough time allotted to do the other foot?"  No, there's not enough time allotted for it.  "I guess he'll determine if he wants to still cut the tumor out on tomorrow?"  Yes, he'll examine it and see if it's necessary.  "I have questions about my medication, whether or not to take it."  Call the surgical center and ask the anesthesiologist.  "Okay, I'll do that now."

## 2015-06-24 NOTE — Telephone Encounter (Signed)
"  I'm scheduled for surgery in the morning.  I do have a couple of  questions.  I'd appreciate a call back."

## 2015-06-25 ENCOUNTER — Encounter: Payer: Self-pay | Admitting: Podiatry

## 2015-06-25 DIAGNOSIS — M2042 Other hammer toe(s) (acquired), left foot: Secondary | ICD-10-CM | POA: Diagnosis not present

## 2015-06-25 DIAGNOSIS — K219 Gastro-esophageal reflux disease without esophagitis: Secondary | ICD-10-CM | POA: Diagnosis not present

## 2015-06-25 DIAGNOSIS — D1809 Hemangioma of other sites: Secondary | ICD-10-CM | POA: Diagnosis not present

## 2015-06-25 DIAGNOSIS — M6740 Ganglion, unspecified site: Secondary | ICD-10-CM | POA: Diagnosis not present

## 2015-06-25 DIAGNOSIS — M67472 Ganglion, left ankle and foot: Secondary | ICD-10-CM | POA: Diagnosis not present

## 2015-06-26 ENCOUNTER — Telehealth: Payer: Self-pay | Admitting: *Deleted

## 2015-06-26 NOTE — Telephone Encounter (Addendum)
Post op courtesy call-Pt states she's doing pretty good.  I instructed pt to RICE and describing the acronym's  Individual letters', because pt states they did not get the Post OP instruction sheet, I told pt no to be up on her foot more than 15 mins/hour or to dangle her foot for that length of time, remain in the boot at all times even to sleep, and could remove the boot and rotate foot and ankle only if resting and not going to sleep, and to leave the original dressing in place until Dr. Paulla Dolly changed at the 1st POV, and to call with concerns.  Pt states understanding.  07/03/2015-Pt called states would like copy of the post op instructions. I told pt I would have a note on her appt listing to pick up a copy from [facility] or me on 07/05/2015.  Pt states understanding. 07/05/2015-Pt states she was seen by Dr. Jacqualyn Posey today, but forgot to ask how much she should be up on the surgery foot, and if she needed to continue to ice.  I told pt to increase her weight bearing time about 10 mins/hour and to back off and increase as the comfort and swelling of the foot allowed, I told her at this time ice is for comfort and episodes of swelling or discomfort. Pt states understanding.

## 2015-06-27 ENCOUNTER — Encounter: Payer: Self-pay | Admitting: Podiatry

## 2015-06-27 ENCOUNTER — Ambulatory Visit (INDEPENDENT_AMBULATORY_CARE_PROVIDER_SITE_OTHER): Payer: PPO

## 2015-06-27 ENCOUNTER — Ambulatory Visit (INDEPENDENT_AMBULATORY_CARE_PROVIDER_SITE_OTHER): Payer: PPO | Admitting: Podiatry

## 2015-06-27 VITALS — BP 125/59 | HR 72 | Resp 16

## 2015-06-27 DIAGNOSIS — Z9889 Other specified postprocedural states: Secondary | ICD-10-CM

## 2015-06-27 DIAGNOSIS — M2042 Other hammer toe(s) (acquired), left foot: Secondary | ICD-10-CM

## 2015-06-27 DIAGNOSIS — L723 Sebaceous cyst: Secondary | ICD-10-CM

## 2015-06-28 NOTE — Progress Notes (Signed)
DOS 06/25/2015 Removal cyst from bottom left big toe, fusion of left 2nd toe with pin.

## 2015-06-28 NOTE — Progress Notes (Signed)
Subjective:     [name], female   DOB: 02-28-1940, 75 y.o.   MRN: [id number]  HPI patient presents stating she was concerned because she bumped her forefoot right and wants to make sure the pin is okay   Review of Systems     Objective:   Physical Exam Neurovascular status intact with dressing intact left forefoot and patient found to have pin in place second toe    Assessment:     Traumatized second toe with no other indications of problems    Plan:     Reviewed x-rays indicating good alignment and continue keeping sterile dressing on until next week we will see her back for regular visit or earlier if any issues should occur  X-rays indicate pin is in place second toe with good alignment

## 2015-07-03 DIAGNOSIS — H6983 Other specified disorders of Eustachian tube, bilateral: Secondary | ICD-10-CM | POA: Diagnosis not present

## 2015-07-03 DIAGNOSIS — H7202 Central perforation of tympanic membrane, left ear: Secondary | ICD-10-CM | POA: Diagnosis not present

## 2015-07-04 ENCOUNTER — Ambulatory Visit: Payer: PPO | Admitting: Neurology

## 2015-07-05 ENCOUNTER — Ambulatory Visit (INDEPENDENT_AMBULATORY_CARE_PROVIDER_SITE_OTHER): Payer: PPO

## 2015-07-05 ENCOUNTER — Ambulatory Visit (INDEPENDENT_AMBULATORY_CARE_PROVIDER_SITE_OTHER): Payer: PPO | Admitting: Podiatry

## 2015-07-05 VITALS — Temp 97.6°F

## 2015-07-05 DIAGNOSIS — M2042 Other hammer toe(s) (acquired), left foot: Secondary | ICD-10-CM

## 2015-07-05 DIAGNOSIS — Z9889 Other specified postprocedural states: Secondary | ICD-10-CM

## 2015-07-10 NOTE — Progress Notes (Signed)
Patient ID: BONNEY MEINS, female   DOB: 14-Nov-1940, 75 y.o.   MRN: [id number]  Subjective: ULANA GESUALDI is a 75 y.o. is seen today in office s/p left hammertoe and cyst excision preformed on 06/25/15. Her pain is improving. She is continued surgical shoe. Denies any systemic complaints such as fevers, chills, nausea, vomiting. No calf pain, chest pain, shortness of breath.   Objective: General: No acute distress, AAOx3  DP/PT pulses palpable 2/4, CRT < 3 sec to all digits.  Protective sensation intact. Motor function intact.  Left foot: Incision is well coapted without any evidence of dehiscence and sutures intact as well as k-wire. There is no surrounding erythema, ascending cellulitis, fluctuance, crepitus, malodor, drainage/purulence. There is mild edema around the surgical site. There is minimal pain along the surgical site.  No other areas of tenderness to bilateral lower extremities.  No other open lesions or pre-ulcerative lesions.  No pain with calf compression, swelling, warmth, erythema.   Assessment and Plan:  Status post left foot surgery doing well with no complications   -Treatment options discussed including all alternatives, risks, and complications -Antibiotic ointment was applied followed by dressing. -Continue surgical shoe. -Pathology results discussed. -Ice/elevation -Pain medication as needed. -Monitor for any clinical signs or symptoms of infection and DVT/PE and directed to call the office immediately should any occur or go to the ER. -Follow-up as scheduled or sooner if any problems arise. In the meantime, encouraged to call the office with any questions, concerns, change in symptoms.   Celesta Gentile, DPM

## 2015-07-13 ENCOUNTER — Encounter (HOSPITAL_COMMUNITY): Payer: Self-pay

## 2015-07-13 ENCOUNTER — Observation Stay (HOSPITAL_COMMUNITY)
Admission: EM | Admit: 2015-07-13 | Discharge: 2015-07-14 | Disposition: A | Discharge status: Home / Self Care | Payer: PPO | Attending: Internal Medicine | Admitting: Internal Medicine

## 2015-07-13 ENCOUNTER — Emergency Department (HOSPITAL_COMMUNITY): Payer: PPO

## 2015-07-13 DIAGNOSIS — F329 Major depressive disorder, single episode, unspecified: Secondary | ICD-10-CM | POA: Diagnosis not present

## 2015-07-13 DIAGNOSIS — Z79899 Other long term (current) drug therapy: Secondary | ICD-10-CM | POA: Insufficient documentation

## 2015-07-13 DIAGNOSIS — R079 Chest pain, unspecified: Secondary | ICD-10-CM | POA: Diagnosis present

## 2015-07-13 DIAGNOSIS — R0789 Other chest pain: Secondary | ICD-10-CM | POA: Diagnosis not present

## 2015-07-13 DIAGNOSIS — M1990 Unspecified osteoarthritis, unspecified site: Secondary | ICD-10-CM | POA: Diagnosis not present

## 2015-07-13 DIAGNOSIS — K219 Gastro-esophageal reflux disease without esophagitis: Secondary | ICD-10-CM | POA: Diagnosis present

## 2015-07-13 DIAGNOSIS — F32A Depression, unspecified: Secondary | ICD-10-CM | POA: Diagnosis present

## 2015-07-13 DIAGNOSIS — E785 Hyperlipidemia, unspecified: Secondary | ICD-10-CM | POA: Insufficient documentation

## 2015-07-13 HISTORY — DX: Cervicalgia: M54.2

## 2015-07-13 HISTORY — DX: Headache, unspecified: R51.9

## 2015-07-13 HISTORY — DX: Dizziness and giddiness: R42

## 2015-07-13 HISTORY — DX: Headache: R51

## 2015-07-13 LAB — BASIC METABOLIC PANEL
Anion gap: 8 (ref 5–15)
BUN: 23 mg/dL — ABNORMAL HIGH (ref 6–20)
CO2: 25 mmol/L (ref 22–32)
Calcium: 9.1 mg/dL (ref 8.9–10.3)
Chloride: 105 mmol/L (ref 101–111)
Creatinine, Ser: 0.52 mg/dL (ref 0.44–1.00)
GFR calc Af Amer: >60 mL/min (ref >60)
GFR calc non Af Amer: >60 mL/min (ref >60)
Glucose, Bld: 77 mg/dL (ref 65–99)
Potassium: 3.6 mmol/L (ref 3.5–5.1)
Sodium: 138 mmol/L (ref 135–145)

## 2015-07-13 LAB — CBC
HCT: 36.3 % (ref 36.0–46.0)
Hemoglobin: 12 g/dL (ref 12.0–15.0)
MCH: 31.2 pg (ref 26.0–34.0)
MCHC: 33.1 g/dL (ref 30.0–36.0)
MCV: 94.3 fL (ref 78.0–100.0)
Platelets: 244 10*3/uL (ref 150–400)
RBC: 3.85 MIL/uL — ABNORMAL LOW (ref 3.87–5.11)
RDW: 13.6 % (ref 11.5–15.5)
WBC: 5.9 10*3/uL (ref 4.0–10.5)

## 2015-07-13 LAB — I-STAT TROPONIN, ED: Troponin i, poc: 0 ng/mL (ref 0.00–0.08)

## 2015-07-13 MED ORDER — GLUCOSAMINE-CHONDROITIN 500-400 MG PO TABS
1.0000 | ORAL_TABLET | ORAL | Status: DC
  Filled 2015-07-13: qty 1

## 2015-07-13 MED ORDER — ASPIRIN 81 MG PO CHEW
324.0000 mg | CHEWABLE_TABLET | Freq: Once | ORAL | Status: AC
  Administered 2015-07-13: 324 mg via ORAL
  Filled 2015-07-13: qty 4

## 2015-07-13 MED ORDER — BUDESONIDE 0.25 MG/2ML IN SUSP
0.2500 mg | Freq: Two times a day (BID) | RESPIRATORY_TRACT | Status: DC
  Filled 2015-07-13: qty 2

## 2015-07-13 MED ORDER — MORPHINE SULFATE (PF) 4 MG/ML IV SOLN: 4.0000 mg | INTRAVENOUS | Status: DC | PRN

## 2015-07-13 MED ORDER — SODIUM CHLORIDE 0.9% FLUSH
3.0000 mL | Freq: Two times a day (BID) | INTRAVENOUS | Status: DC
  Administered 2015-07-13 – 2015-07-14 (×2): 3 mL via INTRAVENOUS

## 2015-07-13 MED ORDER — ASPIRIN EC 81 MG PO TBEC
81.0000 mg | DELAYED_RELEASE_TABLET | Freq: Every day | ORAL | Status: DC
  Administered 2015-07-14: 81 mg via ORAL
  Filled 2015-07-13: qty 1

## 2015-07-13 MED ORDER — NITROGLYCERIN 0.4 MG SL SUBL: 0.4000 mg | SUBLINGUAL_TABLET | SUBLINGUAL | Status: DC | PRN

## 2015-07-13 MED ORDER — RISAQUAD PO CAPS
ORAL_CAPSULE | Freq: Every day | ORAL | Status: DC
  Administered 2015-07-14: 1 via ORAL
  Filled 2015-07-13: qty 1

## 2015-07-13 MED ORDER — FLUOXETINE HCL 20 MG PO CAPS
20.0000 mg | ORAL_CAPSULE | Freq: Every day | ORAL | Status: DC
Start: 2015-07-14 — End: 2015-07-14
  Administered 2015-07-14: 20 mg via ORAL
  Filled 2015-07-13: qty 1

## 2015-07-13 MED ORDER — PANTOPRAZOLE SODIUM 40 MG PO TBEC
40.0000 mg | DELAYED_RELEASE_TABLET | Freq: Every day | ORAL | Status: DC
  Administered 2015-07-14: 40 mg via ORAL
  Filled 2015-07-13: qty 1

## 2015-07-13 NOTE — ED Notes (Signed)
Having indigestion for the past 3 days.  Hurting in the center of my chest.  Have been taking Zantac and it gets better and worse.  I did take a nitroglycerin and it eased off.  I have also taken a valium per pt.  I made a call and they said I should come to the ER.

## 2015-07-13 NOTE — ED Provider Notes (Signed)
CSN: [id number]     Arrival date & time 07/13/15  1922 History   First MD Initiated Contact with Patient 07/13/15 1935     Chief Complaint  Patient presents with  . Chest Pain      HPI Pt was seen at 1940. Per pt, c/o gradual onset and persistence of several intermittent episodes of chest "pain" for the past 3 days. Describes the CP as "tightness," located in her mid-sternal area. CP episodes last anywhere from 30 min to 3 hours before spontaneously resolving. Pt has been taking her GERD medications without improvement. Pt states she took a SL ntg today with improvement. Pt states she "has a little" CP on arrival to the ED. Denies SOB/cough, no palpitations, no abd pain, no N/V/D, no fevers, no calf/LE pain or unilateral swelling.    Past Medical History  Diagnosis Date  . Anxiety   . GERD (gastroesophageal reflux disease)     EGD/ colon 1/09  . Hyperlipidemia   . OSA (obstructive sleep apnea)   . IBS (irritable bowel syndrome)   . Arthritis   . Depression   . Fibromyalgia   . Refusal of blood transfusions as patient is Jehovah's Witness   . H pylori ulcer 1980-1990    s/p treatment  . Headache   . Neck pain   . Vertigo    Past Surgical History  Procedure Laterality Date  . Bilateral salpingoophorectomy  1990s  . Appendectomy    . Knee surgery      Multiple  . Tubal ligation    . Inner ear surgery      Left  . Tonsillectomy    . Abdominal hysterectomy  1970  . Colonoscopy  01/2007    Dr. Meriel Flavors  . Esophagogastroduodenoscopy  01/2007    Dr Sharlett Iles- 3 cm hiatal hernia, benign esophageal biopsies, erosive esophagitis, gastritis  . Shoulder surgery  2011  . Colonoscopy with esophagogastroduodenoscopy (egd)  02/16/2012    Procedure: COLONOSCOPY WITH ESOPHAGOGASTRODUODENOSCOPY (EGD);  Surgeon: Daneil Dolin, MD;  Location: [facility];  Service: Endoscopy;  Laterality: N/A;  8:45  . Esophagogastroduodenoscopy N/A 06/07/2014    Procedure:  ESOPHAGOGASTRODUODENOSCOPY (EGD);  Surgeon: Rogene Houston, MD;  Location: [facility];  Service: Endoscopy;  Laterality: N/A;  1200  . Esophageal dilation N/A 06/07/2014    Procedure: ESOPHAGEAL DILATION;  Surgeon: Rogene Houston, MD;  Location: [facility];  Service: Endoscopy;  Laterality: N/A;  . Colonoscopy N/A 04/18/2015    Procedure: COLONOSCOPY;  Surgeon: Rogene Houston, MD;  Location: [facility];  Service: Endoscopy;  Laterality: N/A;  830 - moved to 8:55 - Ann to notify pt  . Cardiac catheterization  2002    [facility]) normal coronary arteries    Family History  Problem Relation Age of Onset  . Diabetes Father   . Heart attack Father   . Lung cancer Father   . Cirrhosis Father 81    etoh cirrhosis  . Lung cancer Sister   . Depression Sister   . Hypertension Sister   . Diabetes Sister   . Lung cancer Sister   . Paranoid behavior Mother     depression  . Colon polyps Mother    Social History  Substance Use Topics  . Smoking status: Never Smoker   . Smokeless tobacco: Never Used  . Alcohol Use: 0.0 oz/week    0 Standard drinks or equivalent per week     Comment: Occasionally, holidays/special occasions  Review of Systems ROS: Statement: All systems negative except as marked or noted in the HPI; Constitutional: Negative for fever and chills. ; ; Eyes: Negative for eye pain, redness and discharge. ; ; ENMT: Negative for ear pain, hoarseness, nasal congestion, sinus pressure and sore throat. ; ; Cardiovascular: +CP. Negative for palpitations, diaphoresis, dyspnea and peripheral edema. ; ; Respiratory: Negative for cough, wheezing and stridor. ; ; Gastrointestinal: Negative for nausea, vomiting, diarrhea, abdominal pain, blood in stool, hematemesis, jaundice and rectal bleeding. . ; ; Genitourinary: Negative for dysuria, flank pain and hematuria. ; ; Musculoskeletal: Negative for back pain and neck pain. Negative for swelling and trauma.; ; Skin: Negative for pruritus, rash,  abrasions, blisters, bruising and skin lesion.; ; Neuro: Negative for headache, lightheadedness and neck stiffness. Negative for weakness, altered level of consciousness, altered mental status, extremity weakness, paresthesias, involuntary movement, seizure and syncope.      Allergies  Blood-group specific substance; Ciprofloxacin; and Codeine  Home Medications   Prior to Admission medications   Medication Sig Start Date End Date Taking? Authorizing Provider  acetaminophen (TYLENOL) 500 MG tablet Take 1,000 mg by mouth 3 (three) times daily as needed for moderate pain. For pain     Historical Provider, MD  albuterol (PROVENTIL HFA;VENTOLIN HFA) 108 (90 BASE) MCG/ACT inhaler Inhale 2 puffs into the lungs every 6 (six) hours as needed for wheezing. 02/22/14   Mikey Kirschner, MD  b complex vitamins tablet Take 1 tablet by mouth daily.      Historical Provider, MD  CALCIUM PO Take 1 tablet by mouth daily.     Historical Provider, MD  Cholecalciferol (VITAMIN D) 2000 UNITS CAPS Take by mouth. Vitamin D3 2000 units    Historical Provider, MD  Coenzyme Q10 (CO Q 10) 10 MG CAPS Take 1 capsule by mouth daily.      Historical Provider, MD  diazepam (VALIUM) 5 MG tablet Take 5 mg by mouth daily as needed for muscle spasms.    Historical Provider, MD  dicyclomine (BENTYL) 10 MG capsule Take 1 capsule (10 mg total) by mouth 3 (three) times daily as needed. For stomach cramps 01/23/15   Nilda Simmer, NP  FLOVENT HFA 110 MCG/ACT inhaler Inhale 1 puff into the lungs 2 (two) times daily as needed (shortness of breath). Reported on 05/16/2015 03/16/14   Historical Provider, MD  FLUoxetine (PROZAC) 20 MG tablet 2 qd 03/19/15   Kathyrn Drown, MD  glucosamine-chondroitin 500-400 MG tablet Take 1 tablet by mouth daily as needed (pain).     Historical Provider, MD  HYDROcodone-acetaminophen (NORCO) 10-325 MG tablet Take 1 tablet by mouth every 6 (six) hours as needed (one tablet every 4-6 hours prn pain).     Wallene Huh, DPM  HYDROcodone-acetaminophen (NORCO/VICODIN) 5-325 MG tablet Take 1 tablet by mouth 2 (two) times daily as needed for moderate pain. 06/17/15   Kathyrn Drown, MD  KRILL OIL PO Take 1 tablet by mouth daily.     Historical Provider, MD  MAGNESIUM PO Take 1,300 mg by mouth daily.     Historical Provider, MD  mometasone (ELOCON) 0.1 % cream Apply 1 application topically 2 (two) times daily. 03/19/15   Kathyrn Drown, MD  predniSONE (DELTASONE) 10 MG tablet  05/16/15   Historical Provider, MD  Probiotic Product (PROBIOTIC DAILY PO) Take 1 capsule by mouth.     Historical Provider, MD  ranitidine (ZANTAC) 300 MG tablet Take 1 tablet (300 mg total) by mouth  at bedtime. Patient taking differently: Take 300 mg by mouth at bedtime as needed for heartburn.  01/18/15   Kathyrn Drown, MD  sulfacetamide (BLEPH-10) 10 % ophthalmic solution Apply 2 drops to affected eyes four times a day for 3-5 days. 05/21/15   Kathyrn Drown, MD  vitamin B-12 (CYANOCOBALAMIN) 100 MCG tablet Take 100 mcg by mouth daily. Reported on 05/16/2015    Historical Provider, MD  vitamin C (ASCORBIC ACID) 500 MG tablet Take 500-1,000 mg by mouth daily.      Historical Provider, MD   BP 130/67 mmHg  Pulse 78  Temp(Src) 97.6 F (36.4 C) (Oral)  Resp 16  Ht 5\' 5"  (1.651 m)  Wt 210 lb (95.255 kg)  BMI 34.95 kg/m2  SpO2 98% Physical Exam  1945: Physical examination:  Nursing notes reviewed; Vital signs and O2 SAT reviewed;  Constitutional: Well developed, Well nourished, Well hydrated, In no acute distress; Head:  Normocephalic, atraumatic; Eyes: EOMI, PERRL, No scleral icterus; ENMT: Mouth and pharynx normal, Mucous membranes moist; Neck: Supple, Full range of motion, No lymphadenopathy; Cardiovascular: Regular rate and rhythm, No gallop; Respiratory: Breath sounds clear & equal bilaterally, No wheezes. Speaking full sentences with ease, Normal respiratory effort/excursion; Chest: Nontender, Movement normal; Abdomen: Soft,  Nontender, Nondistended, Normal bowel sounds; Genitourinary: No CVA tenderness; Extremities: Pulses normal, +post-op shoe left foot. No tenderness, No edema, No calf edema or asymmetry.; Neuro: AA&Ox3, Major CN grossly intact.  Speech clear. No gross focal motor or sensory deficits in extremities.; Skin: Color normal, Warm, Dry.; Psych:  Affect flat.    ED Course  Procedures (including critical care time) Labs Review  Imaging Review  I have personally reviewed and evaluated these images and lab results as part of my medical decision-making.   EKG Interpretation   Date/Time:  Saturday Jul 13 2015 19:39:34 EDT Ventricular Rate:  74 PR Interval:  148 QRS Duration: 88 QT Interval:  398 QTC Calculation: 442 R Axis:   24 Text Interpretation:  Sinus rhythm Abnormal R-wave progression, early  transition When compared with ECG of 02/01/2009 No significant change was  found Confirmed by [facility]  MD, Nunzio Cory [phone]) on 07/13/2015 7:51:11 PM      MDM  MDM Reviewed: previous chart, nursing note and vitals Reviewed previous: labs and ECG Interpretation: labs, ECG and x-ray     Results for orders placed or performed during the hospital encounter of [id number]  Basic metabolic panel  Result Value Ref Range   Sodium 138 135 - 145 mmol/L   Potassium 3.6 3.5 - 5.1 mmol/L   Chloride 105 101 - 111 mmol/L   CO2 25 22 - 32 mmol/L   Glucose, Bld 77 65 - 99 mg/dL   BUN 23 (H) 6 - 20 mg/dL   Creatinine, Ser 0.52 0.44 - 1.00 mg/dL   Calcium 9.1 8.9 - 10.3 mg/dL   GFR calc non Af Amer >60 >60 mL/min   GFR calc Af Amer >60 >60 mL/min   Anion gap 8 5 - 15  CBC  Result Value Ref Range   WBC 5.9 4.0 - 10.5 K/uL   RBC 3.85 (L) 3.87 - 5.11 MIL/uL   Hemoglobin 12.0 12.0 - 15.0 g/dL   HCT 36.3 36.0 - 46.0 %   MCV 94.3 78.0 - 100.0 fL   MCH 31.2 26.0 - 34.0 pg   MCHC 33.1 30.0 - 36.0 g/dL   RDW 13.6 11.5 - 15.5 %   Platelets 244 150 - 400 K/uL  I-stat  troponin, ED  Result Value Ref Range    Troponin i, poc 0.00 0.00 - 0.08 ng/mL   Comment 3           Dg Chest 2 View 07/13/2015  CLINICAL DATA:  Patient with nonradiating centralized chest pain. EXAM: CHEST  2 VIEW COMPARISON:  Chest radiograph 03/20/2014. FINDINGS: Stable cardiac and mediastinal contours. Bandlike opacities left lower lung. No large area pulmonary consolidation. No pleural effusion or pneumothorax. Mid thoracic spine degenerative changes. Overlying monitoring leads. IMPRESSION: Left basilar atelectasis.  No acute cardiopulmonary process. Electronically Signed   By: Lovey Newcomer M.D.   On: 07/13/2015 20:08    2130:  Feels improved after meds. Concerning HPI, will observation admit.  Dx and testing d/w pt and family.  Questions answered.  Verb understanding, agreeable to admit. T/C to [facility]. [place] Comment, case discussed, including:  HPI, pertinent PM/SHx, VS/PE, dx testing, ED course and treatment:  Agreeable to admit, requests to write temporary orders, obtain observation tele bed to team [facility].    Francine Graven, DO 07/15/15 2002

## 2015-07-13 NOTE — H&P (Signed)
History and Physical    Ruth [name] [id number] DOB: [birth year] DOA: 07/13/2015  Referring MD/NP/PA: Margette Fast, DO PCP: Sallee Lange, MD  Outpatient Specialists: Gastroenterology; Daneil Dolin, MD Patient coming from: home  Chief Complaint: Chest pain  HPI: Ruth [name] is a 75 y.o. female with medical history significant for negative heart cath in 2001 performed for past CP which felt similar to these episodes, GERD, prediabetes, HLD, depression and anxiety, presented with complaints of gradual onset, intermittent, worsening episodes of chest pain described as tightness, located in her mid-sternal area onset 3 days ago. Each reported episode lasts approximately 30 minutes to 3 hours before spontaneously resolving. She reports taking her GERD medication without improvements. NTG does improve her symptoms. She does not take any cholesterol medication. Denies any SOB/cough, abd pain, N/V/D, fevers or lower leg pain. She has a family history of heart problems, her father died of an MI at age 77.  While in the ED, BMP and CBC were unremarkable. Her troponin was also negative. A CXR was performed and showed no active cardiopulmonary disease. Hospitalist was asked to refer for chest pain rule out.   Review of Systems: As per HPI otherwise 10 point review of systems negative.   Past Medical History  Diagnosis Date  . Anxiety   . GERD (gastroesophageal reflux disease)     EGD/ colon 1/09  . Hyperlipidemia   . OSA (obstructive sleep apnea)   . IBS (irritable bowel syndrome)   . Arthritis   . Depression   . Fibromyalgia   . Refusal of blood transfusions as patient is Jehovah's Witness   . H pylori ulcer 1980-1990    s/p treatment  . Headache   . Neck pain   . Vertigo     Past Surgical History  Procedure Laterality Date  . Bilateral salpingoophorectomy  1990s  . Appendectomy    . Knee surgery      Multiple  . Tubal ligation    . Inner ear surgery      Left  .  Tonsillectomy    . Abdominal hysterectomy  1970  . Colonoscopy  01/2007    Dr. Meriel Flavors  . Esophagogastroduodenoscopy  01/2007    Dr Sharlett Iles- 3 cm hiatal hernia, benign esophageal biopsies, erosive esophagitis, gastritis  . Shoulder surgery  2011  . Colonoscopy with esophagogastroduodenoscopy (egd)  02/16/2012    Procedure: COLONOSCOPY WITH ESOPHAGOGASTRODUODENOSCOPY (EGD);  Surgeon: Daneil Dolin, MD;  Location: [facility];  Service: Endoscopy;  Laterality: N/A;  8:45  . Esophagogastroduodenoscopy N/A 06/07/2014    Procedure: ESOPHAGOGASTRODUODENOSCOPY (EGD);  Surgeon: Rogene Houston, MD;  Location: [facility];  Service: Endoscopy;  Laterality: N/A;  1200  . Esophageal dilation N/A 06/07/2014    Procedure: ESOPHAGEAL DILATION;  Surgeon: Rogene Houston, MD;  Location: [facility];  Service: Endoscopy;  Laterality: N/A;  . Colonoscopy N/A 04/18/2015    Procedure: COLONOSCOPY;  Surgeon: Rogene Houston, MD;  Location: [facility];  Service: Endoscopy;  Laterality: N/A;  830 - moved to 8:55 - Ann to notify pt  . Cardiac catheterization  2002    [facility]) normal coronary arteries      reports that she has never smoked. She has never used smokeless tobacco. She reports that she drinks alcohol. She reports that she does not use illicit drugs.  Allergies  Allergen Reactions  . Blood-Group Specific Substance     NO BLOOD PRODUCTS-refuses transfusions  . Ciprofloxacin Other (See Comments)  Pt reports extreme fatigue  . Codeine Nausea And Vomiting    Family History  Problem Relation Age of Onset  . Diabetes Father   . Heart attack Father   . Lung cancer Father   . Cirrhosis Father 50    etoh cirrhosis  . Lung cancer Sister   . Depression Sister   . Hypertension Sister   . Diabetes Sister   . Lung cancer Sister   . Paranoid behavior Mother     depression  . Colon polyps Mother     Prior to Admission medications   Medication Sig Start Date End Date Taking?  Authorizing Provider  albuterol (PROVENTIL HFA;VENTOLIN HFA) 108 (90 BASE) MCG/ACT inhaler Inhale 2 puffs into the lungs every 6 (six) hours as needed for wheezing. 02/22/14  Yes Mikey Kirschner, MD  b complex vitamins tablet Take 1 tablet by mouth daily.     Yes Historical Provider, MD  CALCIUM PO Take 1 tablet by mouth daily.    Yes Historical Provider, MD  Cholecalciferol (VITAMIN D) 2000 UNITS CAPS Take by mouth. Vitamin D3 2000 units   Yes Historical Provider, MD  Coenzyme Q10 (CO Q 10) 10 MG CAPS Take 1 capsule by mouth daily.     Yes Historical Provider, MD  diazepam (VALIUM) 5 MG tablet Take 5 mg by mouth daily as needed for muscle spasms.   Yes Historical Provider, MD  dicyclomine (BENTYL) 10 MG capsule Take 1 capsule (10 mg total) by mouth 3 (three) times daily as needed. For stomach cramps 01/23/15  Yes Nilda Simmer, NP  FLOVENT HFA 110 MCG/ACT inhaler Inhale 2 puffs into the lungs daily. Reported on 05/16/2015 03/16/14  Yes Historical Provider, MD  FLUoxetine (PROZAC) 20 MG tablet 2 qd Patient taking differently: Take 20 mg by mouth 2 (two) times daily as needed.  03/19/15  Yes Kathyrn Drown, MD  glucosamine-chondroitin 500-400 MG tablet Take 1 tablet by mouth 2 (two) times a week.    Yes Historical Provider, MD  HYDROcodone-acetaminophen (NORCO/VICODIN) 5-325 MG tablet Take 1 tablet by mouth 2 (two) times daily as needed for moderate pain. Patient taking differently: Take 0.5-1 tablets by mouth 2 (two) times daily as needed for moderate pain.  06/17/15  Yes Kathyrn Drown, MD  KRILL OIL PO Take 1 tablet by mouth daily.    Yes Historical Provider, MD  MAGNESIUM PO Take 1,300 mg by mouth daily.    Yes Historical Provider, MD  nitroGLYCERIN (NITROSTAT) 0.4 MG SL tablet Place 0.4 mg under the tongue every 5 (five) minutes as needed for chest pain.   Yes Historical Provider, MD  Probiotic Product (PROBIOTIC DAILY PO) Take 1 capsule by mouth daily.    Yes Historical Provider, MD  ranitidine  (ZANTAC) 300 MG tablet Take 1 tablet (300 mg total) by mouth at bedtime. Patient taking differently: Take 300 mg by mouth at bedtime as needed for heartburn.  01/18/15  Yes Kathyrn Drown, MD  vitamin B-12 (CYANOCOBALAMIN) 100 MCG tablet Take 100 mcg by mouth daily. Reported on 05/16/2015   Yes Historical Provider, MD  vitamin C (ASCORBIC ACID) 500 MG tablet Take 500-1,000 mg by mouth daily.     Yes Historical Provider, MD    Physical Exam: Filed Vitals:   07/13/15 1928 07/13/15 2049  BP: 130/67 130/74  Pulse: 78 70  Temp: 97.6 F (36.4 C)   TempSrc: Oral   Resp: 16 14  Height: 5\' 5"  (1.651 m)   Weight: 95.255  kg (210 lb)   SpO2: 98% 97%    Constitutional: NAD, calm, comfortable Filed Vitals:   07/13/15 1928 07/13/15 2049  BP: 130/67 130/74  Pulse: 78 70  Temp: 97.6 F (36.4 C)   TempSrc: Oral   Resp: 16 14  Height: 5\' 5"  (1.651 m)   Weight: 95.255 kg (210 lb)   SpO2: 98% 97%   Eyes: PERRL, lids and conjunctivae normal ENMT: Mucous membranes are moist. Posterior pharynx clear of any exudate or lesions.Normal dentition.  Neck: normal, supple, no masses, no thyromegaly Respiratory: clear to auscultation bilaterally, no wheezing, no crackles. Normal respiratory effort. No accessory muscle use.  Cardiovascular: Regular rate and rhythm, no murmurs / rubs / gallops. No extremity edema. 2+ pedal pulses. No carotid bruits.  Abdomen: no tenderness, no masses palpated. No hepatosplenomegaly. Bowel sounds positive.  Musculoskeletal: no clubbing / cyanosis. No joint deformity upper and lower extremities. Good ROM, no contractures. Normal muscle tone.  Skin: no rashes, lesions, ulcers. No induration Neurologic: CN 2-12 grossly intact. Sensation intact, DTR normal. Strength 5/5 in all 4.  Psychiatric: Normal judgment and insight. Alert and oriented x 3. Normal mood.   Labs on Admission: I have personally reviewed following labs and imaging studies  CBC:  Recent Labs Lab  07/13/15 1945  WBC 5.9  HGB 12.0  HCT 36.3  MCV 94.3  PLT [id number]   Basic Metabolic Panel:  Recent Labs Lab 07/13/15 1945  NA 138  K 3.6  CL 105  CO2 25  GLUCOSE 77  BUN 23*  CREATININE 0.52  CALCIUM 9.1    Radiological Exams on Admission: Dg Chest 2 View  07/13/2015  CLINICAL DATA:  Patient with nonradiating centralized chest pain. EXAM: CHEST  2 VIEW COMPARISON:  Chest radiograph 03/20/2014. FINDINGS: Stable cardiac and mediastinal contours. Bandlike opacities left lower lung. No large area pulmonary consolidation. No pleural effusion or pneumothorax. Mid thoracic spine degenerative changes. Overlying monitoring leads. IMPRESSION: Left basilar atelectasis.  No acute cardiopulmonary process. Electronically Signed   By: Lovey Newcomer M.D.   On: 07/13/2015 20:08    EKG: Independently reviewed.   Assessment/Plan Active Problems:   Chest pain  1. Chest pain:  It is atypical. Will admit for chest pain rule out. She has a hx of cardiac catheterization in 2001 which was clean at that time. Troponin is negative. CXR revealed no acute cardiopulmonary process. EKG showed no acute ST T changes. Cycle troponin and continue to monitor on telemetry. Start on ASA once per day, though she does not like taking medicaton. Will order ECHO. 2. GERD. Start on PPI 3. HLD. Past lipid panel from 05/2015 showed total cholesterol 265. She does not currently take any statins; I have recommended to her that she start taking her statin, but she declined.  4. Prediabetes. Blood sugars stable.  DVT prophylaxis: baby aspirin  Code Status: Full Family Communication: discussed with patient and husband, Herbie Baltimore, present at bedside.  Disposition Plan: discharge home once improved Consults called: none Admission status: admit to telemetry   Orvan Falconer, MD FACP [facility]   If 7PM-7AM, please contact night-coverage [email or web address] Password [email or web address]  07/13/2015, 9:18 PM   By signing my name below, I,  Delene Ruffini, attest that this documentation has been prepared under the direction and in the presence of Orvan Falconer, MD. Electronically Signed: Delene Ruffini, Scribe 07/13/2015 9:15pm

## 2015-07-14 ENCOUNTER — Observation Stay (HOSPITAL_COMMUNITY): Payer: PPO

## 2015-07-14 DIAGNOSIS — F329 Major depressive disorder, single episode, unspecified: Secondary | ICD-10-CM

## 2015-07-14 DIAGNOSIS — E785 Hyperlipidemia, unspecified: Secondary | ICD-10-CM | POA: Diagnosis not present

## 2015-07-14 DIAGNOSIS — R0789 Other chest pain: Secondary | ICD-10-CM | POA: Diagnosis not present

## 2015-07-14 LAB — TROPONIN I
Troponin I: <0.03 ng/mL (ref <0.031)
Troponin I: <0.03 ng/mL (ref <0.031)
Troponin I: <0.03 ng/mL (ref <0.031)

## 2015-07-14 LAB — TSH: TSH: 3.263 u[IU]/mL (ref 0.350–4.500)

## 2015-07-14 NOTE — Progress Notes (Signed)
Patient alert and oriented, independent, VSS, pt. Tolerating diet well. No complaints of pain or nausea. Pt. Had IV removed tip intac. Pt. Voiced understanding of discharge instructions with no further questions. Pt. Discharged via wheelchair with auxilliary.

## 2015-07-14 NOTE — Discharge Summary (Signed)
Physician Discharge Summary  Ruth [name] [id number] DOB: July 20, 1940 DOA: 07/13/2015  PCP: Sallee Lange, MD  Admit date: 07/13/2015 Discharge date: 07/14/2015  Time spent: 20 minutes  Recommendations for Outpatient Follow-up:  1. Follow up with PCP In 2-3 weeks 2. Follow up with Dr. Domenic Polite as needed   Discharge Diagnoses:  Principal Problem:   Atypical chest pain Active Problems:   GASTROESOPHAGEAL REFLUX DISEASE, CHRONIC   Depression   Hyperlipidemia   Chest pain   Discharge Condition: Stable  Diet recommendation: Heart healthy  Filed Weights   07/13/15 1928 07/13/15 2256  Weight: 95.255 kg (210 lb) 97.206 kg (214 lb 4.8 oz)    History of present illness:  Please review dictated H and P from 5/27 for details. Briefly, 75 y.o. female with medical history significant for negative heart cath in 2001 performed for past CP which felt similar to these episodes, GERD, prediabetes, HLD, depression and anxiety, presented with complaints of gradual onset, intermittent, worsening episodes of chest pain described as tightness, located in her mid-sternal area onset 3 days prior to admission. Patient reports feeling stressed recently over family related issues.  Hospital Course:  1. Chest pain: It is atypical and reproducible on exam. Patient was admitted to the floor. Patient has a hx of cardiac catheterization in 2001 which was clean at that time. CXR revealed no acute cardiopulmonary process. EKG showed no acute ST T changes. Empirically started on ASA once per day, though she does not like taking medicaton. Patient's troponins were serially negative x 4. Patient remained medically stable with no recurrence of chest pains 2. GERD. Started on PPI 3. HLD. Past lipid panel from 05/2015 showed total cholesterol 265. She does not currently take any statins; Admitting physician recommended to her that she start taking her statin, but she declined.  4. Prediabetes. Blood sugars  stable.  Discharge Exam: Filed Vitals:   07/13/15 2100 07/13/15 2200 07/13/15 2256 07/14/15 0605  BP: 122/52 118/66 123/75 125/53  Pulse: 65 68 67 68  Temp:   97.5 F (36.4 C) 97.6 F (36.4 C)  TempSrc:   Oral Oral  Resp: 15 15 17 16   Height:   5\' 5"  (1.651 m)   Weight:   97.206 kg (214 lb 4.8 oz)   SpO2: 99% 100% 100% 96%    General: Awake, in nad Cardiovascular: regular, s1, s2 Respiratory: normal resp effort, no wheezing  Discharge Instructions     Medication List    STOP taking these medications        ranitidine 300 MG tablet  Commonly known as:  ZANTAC      TAKE these medications        albuterol 108 (90 Base) MCG/ACT inhaler  Commonly known as:  PROVENTIL HFA;VENTOLIN HFA  Inhale 2 puffs into the lungs every 6 (six) hours as needed for wheezing.     b complex vitamins tablet  Take 1 tablet by mouth daily.     CALCIUM PO  Take 1 tablet by mouth daily.     Co Q 10 10 MG Caps  Take 1 capsule by mouth daily.     diazepam 5 MG tablet  Commonly known as:  VALIUM  Take 5 mg by mouth daily as needed for muscle spasms.     dicyclomine 10 MG capsule  Commonly known as:  BENTYL  Take 1 capsule (10 mg total) by mouth 3 (three) times daily as needed. For stomach cramps     FLOVENT HFA 110 MCG/ACT  inhaler  Generic drug:  fluticasone  Inhale 2 puffs into the lungs daily. Reported on 05/16/2015     FLUoxetine 20 MG tablet  Commonly known as:  PROZAC  2 qd     glucosamine-chondroitin 500-400 MG tablet  Take 1 tablet by mouth 2 (two) times a week.     HYDROcodone-acetaminophen 5-325 MG tablet  Commonly known as:  NORCO/VICODIN  Take 1 tablet by mouth 2 (two) times daily as needed for moderate pain.     KRILL OIL PO  Take 1 tablet by mouth daily.     MAGNESIUM PO  Take 1,300 mg by mouth daily.     nitroGLYCERIN 0.4 MG SL tablet  Commonly known as:  NITROSTAT  Place 0.4 mg under the tongue every 5 (five) minutes as needed for chest pain.      PROBIOTIC DAILY PO  Take 1 capsule by mouth daily.     vitamin B-12 100 MCG tablet  Commonly known as:  CYANOCOBALAMIN  Take 100 mcg by mouth daily. Reported on 05/16/2015     vitamin C 500 MG tablet  Commonly known as:  ASCORBIC ACID  Take 500-1,000 mg by mouth daily.     Vitamin D 2000 units Caps  Take by mouth. Vitamin D3 2000 units       Allergies  Allergen Reactions  . Blood-Group Specific Substance     NO BLOOD PRODUCTS-refuses transfusions  . Ciprofloxacin Other (See Comments)    Pt reports extreme fatigue  . Codeine Nausea And Vomiting   Follow-up Information    Schedule an appointment as soon as possible for a visit with Sallee Lange, MD.   Specialty:  Family Medicine   Why:  Hospital follow up   Contact information:   [place] [address] [phone]       Follow up with Rozann Lesches, MD.   Specialty:  Cardiology   Why:  As needed   Contact information:   [place] [address] [phone]        The results of significant diagnostics from this hospitalization (including imaging, microbiology, ancillary and laboratory) are listed below for reference.    Significant Diagnostic Studies: Dg Chest 2 View  07/13/2015  CLINICAL DATA:  Patient with nonradiating centralized chest pain. EXAM: CHEST  2 VIEW COMPARISON:  Chest radiograph 03/20/2014. FINDINGS: Stable cardiac and mediastinal contours. Bandlike opacities left lower lung. No large area pulmonary consolidation. No pleural effusion or pneumothorax. Mid thoracic spine degenerative changes. Overlying monitoring leads. IMPRESSION: Left basilar atelectasis.  No acute cardiopulmonary process. Electronically Signed   By: Lovey Newcomer M.D.   On: 07/13/2015 20:08   Dg Foot 2 Views Left  07/08/2015  K wire projecting in the expected position. No evidence of breakage or loosening.   Microbiology: No results found for this or any previous visit (from the past 240  hour(s)).   Labs: Basic Metabolic Panel:  Recent Labs Lab 07/13/15 1945  NA 138  K 3.6  CL 105  CO2 25  GLUCOSE 77  BUN 23*  CREATININE 0.52  CALCIUM 9.1   Liver Function Tests: No results for input(s): AST, ALT, ALKPHOS, BILITOT, PROT, ALBUMIN in the last 168 hours. No results for input(s): LIPASE, AMYLASE in the last 168 hours. No results for input(s): AMMONIA in the last 168 hours. CBC:  Recent Labs Lab 07/13/15 1945  WBC 5.9  HGB 12.0  HCT 36.3  MCV 94.3  PLT 244  Cardiac Enzymes:  Recent Labs Lab 07/14/15 0043 07/14/15 0635 07/14/15 1156  TROPONINI <0.03 <0.03 <0.03   BNP: BNP (last 3 results) No results for input(s): BNP in the last 8760 hours.  ProBNP (last 3 results) No results for input(s): PROBNP in the last 8760 hours.  CBG: No results for input(s): GLUCAP in the last 168 hours.   Signed:  Ameerah Huffstetler, Orpah Melter  [facility] 07/14/2015, 1:14 PM

## 2015-07-14 NOTE — Progress Notes (Signed)
PHARMACIST - PHYSICIAN ORDER COMMUNICATION  CONCERNING: P&T Medication Policy on Herbal Medications  DESCRIPTION:  This patient's order for:  glucosamine  has been noted.  This product(s) is classified as an "herbal" or natural product. Due to a lack of definitive safety studies or FDA approval, nonstandard manufacturing practices, plus the potential risk of unknown drug-drug interactions while on inpatient medications, the Pharmacy and Therapeutics Committee does not permit the use of "herbal" or natural products of this type within [place].   ACTION TAKEN: The pharmacy department is unable to verify this order at this time and your patient has been informed of this safety policy. Please reevaluate patient's clinical condition at discharge and address if the herbal or natural product(s) should be resumed at that time.   

## 2015-07-14 NOTE — Progress Notes (Signed)
Pt states she no longer uses Flovent inhaler, does not want Pulmicort neb. RT will continue to monitor.

## 2015-07-16 ENCOUNTER — Ambulatory Visit: Payer: PPO | Admitting: Neurology

## 2015-07-16 ENCOUNTER — Telehealth: Payer: Self-pay | Admitting: *Deleted

## 2015-07-16 NOTE — Telephone Encounter (Signed)
No showed NP appt 

## 2015-07-17 ENCOUNTER — Encounter: Payer: Self-pay | Admitting: Neurology

## 2015-07-19 ENCOUNTER — Encounter: Payer: Self-pay | Admitting: Podiatry

## 2015-07-19 ENCOUNTER — Ambulatory Visit (INDEPENDENT_AMBULATORY_CARE_PROVIDER_SITE_OTHER): Payer: PPO

## 2015-07-19 ENCOUNTER — Ambulatory Visit (INDEPENDENT_AMBULATORY_CARE_PROVIDER_SITE_OTHER): Payer: PPO | Admitting: Podiatry

## 2015-07-19 VITALS — BP 136/88 | HR 69 | Resp 12

## 2015-07-19 DIAGNOSIS — M2042 Other hammer toe(s) (acquired), left foot: Secondary | ICD-10-CM | POA: Diagnosis not present

## 2015-07-19 DIAGNOSIS — Z9889 Other specified postprocedural states: Secondary | ICD-10-CM

## 2015-07-22 NOTE — Progress Notes (Signed)
Subjective:     [name], female   DOB: [birth year], 75 y.o.   MRN: [id number]  HPI patient states the pain has come partway out on my second toe and I think overall and healing well with good position   Review of Systems     Objective:   Physical Exam Neurovascular status intact negative Homans sign noted with the pin which is moved out by about 1 inch on the left second toe but in good alignment as far as the digit itself goes with plantar incision that's healing well    Assessment:     Doing well after having surgery of the left foot with unfortunate distal migration of the pin second toe    Plan:     At this point I did remove the pin from the second toe and I gave instructions on stabilization of the toe along with bandage treatment. Reappoint to recheck  X-ray indicated toes in good alignment and showed her how to be kept in this position without the pin in place

## 2015-07-25 DIAGNOSIS — M1711 Unilateral primary osteoarthritis, right knee: Secondary | ICD-10-CM | POA: Diagnosis not present

## 2015-07-25 DIAGNOSIS — M170 Bilateral primary osteoarthritis of knee: Secondary | ICD-10-CM | POA: Diagnosis not present

## 2015-07-30 ENCOUNTER — Encounter: Payer: Self-pay | Admitting: Podiatry

## 2015-08-13 ENCOUNTER — Ambulatory Visit (INDEPENDENT_AMBULATORY_CARE_PROVIDER_SITE_OTHER): Payer: PPO | Admitting: Family Medicine

## 2015-08-13 ENCOUNTER — Encounter: Payer: Self-pay | Admitting: Family Medicine

## 2015-08-13 VITALS — Ht 65.0 in | Wt 213.0 lb

## 2015-08-13 DIAGNOSIS — R21 Rash and other nonspecific skin eruption: Secondary | ICD-10-CM | POA: Diagnosis not present

## 2015-08-13 NOTE — Progress Notes (Signed)
   Subjective:    [name], female    DOB: 03/08/1940, 75 y.o.   MRN: [id number]  HPI Patient has a tick on left side of head.Not sure how long the tick is been on. Concerns the patient.  Review of Systems No headache no fever no chills no rash    Objective:   Physical Exam Alert vital stable lungs clear heart regular in rhythm slight red irritated area at site of tick tick is prepped grasped near the skin with fine forceps and removed with gentle traction       Assessment & Plan:  Impression tick bite with localized minimal rash discussed warning signs discussed 15 minutes spent most in discussion WSL

## 2015-08-16 ENCOUNTER — Ambulatory Visit (INDEPENDENT_AMBULATORY_CARE_PROVIDER_SITE_OTHER): Payer: PPO | Admitting: Podiatry

## 2015-08-16 ENCOUNTER — Encounter: Payer: Self-pay | Admitting: Podiatry

## 2015-08-16 ENCOUNTER — Ambulatory Visit (INDEPENDENT_AMBULATORY_CARE_PROVIDER_SITE_OTHER): Payer: PPO

## 2015-08-16 DIAGNOSIS — Z9889 Other specified postprocedural states: Secondary | ICD-10-CM

## 2015-08-16 DIAGNOSIS — B351 Tinea unguium: Secondary | ICD-10-CM

## 2015-08-16 DIAGNOSIS — M2042 Other hammer toe(s) (acquired), left foot: Secondary | ICD-10-CM

## 2015-08-16 NOTE — Progress Notes (Signed)
Subjective:     [name], female   DOB: [birth year], 75 y.o.   MRN: [id number]  HPI patient states I'm doing well but I still gets some swelling of my second toe I wanted to get checked   Review of Systems     Objective:   Physical Exam Neurovascular status intact negative Homans sign noted with second digit in good alignment with mild edema second toe consistent with this. Postop    Assessment:     Doing well with mild inflammation second toe    Plan:     H&P and x-rays reviewed and at this point I just recommended time and being careful with shoes as far as this goes  X-ray report indicated good alignment with no indications of advanced pathology

## 2015-08-29 ENCOUNTER — Telehealth: Payer: Self-pay | Admitting: Family Medicine

## 2015-08-29 ENCOUNTER — Ambulatory Visit (INDEPENDENT_AMBULATORY_CARE_PROVIDER_SITE_OTHER): Payer: PPO | Admitting: Family Medicine

## 2015-08-29 ENCOUNTER — Encounter: Payer: Self-pay | Admitting: Family Medicine

## 2015-08-29 VITALS — BP 124/72 | Ht 65.0 in | Wt 211.1 lb

## 2015-08-29 DIAGNOSIS — M150 Primary generalized (osteo)arthritis: Secondary | ICD-10-CM

## 2015-08-29 DIAGNOSIS — M8949 Other hypertrophic osteoarthropathy, multiple sites: Secondary | ICD-10-CM

## 2015-08-29 DIAGNOSIS — E785 Hyperlipidemia, unspecified: Secondary | ICD-10-CM

## 2015-08-29 DIAGNOSIS — M159 Polyosteoarthritis, unspecified: Secondary | ICD-10-CM

## 2015-08-29 NOTE — Progress Notes (Signed)
   Subjective:    [name], female    DOB: 08-14-1940, 75 y.o.   MRN: [id number]  HPI  Patient in today for a surgical clearance for total knee replacement.  The patient was originally seen by cardiology back in November and was given cardiac clearance she had a echo completed at that time. This patient has a severe worry that she is going to have a heart attack with her surgery. She does have risk factors of heart disease in the family as well as elevated cholesterol with intolerance of statins and also moderate obesity and sedentary lifestyle. Her echo back in November was normal she did not have a stress test  Would like to discuss use of flonase. Patient was told she uses intermittently for allergies  Review of Systems She denies any chest tightness pressure pain denies shortness of breath but states she does very minimal activity because of her knee and is been this way for years    Objective:   Physical Exam  On today's exam no neck masses lungs are clear no crackles heart regular no murmurs extremities no edema severe osteoarthritis noted in the right knee 25 minutes was spent with the patient. Greater than half the time was spent in discussion and answering questions and counseling regarding the issues that the patient came in for today. The vast majority of our visit was spent discussing the risk of heart disease and the risk of this during surgery. Patient has significant concerns and worries but she also understands she needs to get her surgery completed  My concern with this patient is she is not active enough to be able to assess if she is having any angina related issues. Knee replacement surgery is considered significant surgery that could be complicated by any occurrence of heart disease during the surgery.    Assessment & Plan:  Patient does have risk factors for heart disease patient has high fear of having a heart attack with her knee replacement.I have  informed her that there is no way to completely eliminate the risk but possibly a stress test that would be normal before the procedure would help classify her in a lower risk category and also alleviate some of her fears. Patient is sedentary because of her knee pain she cannot obtain 4 METSIn regards to activity  We will go ahead and speak with cardiology to see if they feel that Ruth [name] may be necessary

## 2015-08-29 NOTE — Telephone Encounter (Signed)
Pt seen for surgical clearance on 08/29/15. [facility] to see if pt wanted refills on hyrdrocodone. ( there was a sticky note on form that states my last pill will be July 19th )

## 2015-08-29 NOTE — Telephone Encounter (Signed)
Form from [facility] in your folder. Also pt will need refill on hydrocodone. She runs out on the 19th. Surgery on august 15th.

## 2015-08-29 NOTE — Telephone Encounter (Signed)
Pt's husband dropped off a form to be filled out and stated that the pt would like to use Dr. Harl Bowie for the stress test. Form in nurse folder.

## 2015-08-29 NOTE — Telephone Encounter (Signed)
She may have a refill on her hydrocodone

## 2015-08-30 ENCOUNTER — Other Ambulatory Visit: Payer: Self-pay | Admitting: *Deleted

## 2015-08-30 MED ORDER — HYDROCODONE-ACETAMINOPHEN 5-325 MG PO TABS: 1.0000 | ORAL_TABLET | Freq: Two times a day (BID) | ORAL | Status: DC | PRN

## 2015-08-30 NOTE — Telephone Encounter (Signed)
Pt wants to see Dr. Harl Bowie or someone local.

## 2015-08-30 NOTE — Telephone Encounter (Signed)
Pt notified script ready for pick up.

## 2015-09-02 NOTE — Progress Notes (Signed)
Nurse's-please call the patient. Let her know I did speak with cardiology. They feel that doing the stress test would be reasonable. Please put in urgent referral to cardiology to see Dr. Elmer Bales for Myoview stress test to be completed so that she can have her August 15 surgery.

## 2015-09-03 ENCOUNTER — Encounter: Payer: Self-pay | Admitting: Family Medicine

## 2015-09-03 ENCOUNTER — Telehealth: Payer: Self-pay

## 2015-09-03 DIAGNOSIS — Z8679 Personal history of other diseases of the circulatory system: Secondary | ICD-10-CM

## 2015-09-03 NOTE — Telephone Encounter (Signed)
Please help get her in appointment with the local [place] office she does need a non-treadmill stress test with [facility] as per discussion with Dr. Raliegh Ip, her surgery is August 15 please see what you can do helping this patient out. Make sure referral is in the system and forwarded to [facility] to try to help set this up ASAP

## 2015-09-04 ENCOUNTER — Telehealth: Payer: Self-pay | Admitting: Family Medicine

## 2015-09-04 NOTE — Telephone Encounter (Signed)
Please forward surgical clearance to [place]. Also make sure that recent notes in any recent lab work is sent with this. This patient still needs the cardiology testing. Are you aware of the status of this? The patient 1 and to have this done in [place] a possible and has her surgery coming up August 15 please let me know what you know Thank you

## 2015-09-04 NOTE — Telephone Encounter (Signed)
Ruth [name] was notified and worked on this yesterday.

## 2015-09-05 NOTE — Telephone Encounter (Signed)
I called and spoke with Jarrett Soho at the office in [place], but Dr. Harl Bowie has no availability there.  I will notify patient.

## 2015-09-05 NOTE — Telephone Encounter (Signed)
Ruth [name]-thank you. I recommend that you touch base with the patient. She had requested the [place] office because she states she has a fear of driving in did not want to go outside of [place]. If even is the only place she could get in sooner she will just have to figure out how to get there. If necessary try to rearrange appointment to [facility] but certainly I understand we are on time constraints because of surgery coming August 15

## 2015-09-05 NOTE — Telephone Encounter (Signed)
Yes, I have set up an appointment for Ruth [name] with Dr. Harl Bowie at the [facility] office.  Her appointment is September 18, 2015.  I mailed the patient a letter notifying her of her appointment.

## 2015-09-18 ENCOUNTER — Ambulatory Visit: Payer: Self-pay | Admitting: Cardiology

## 2015-09-20 ENCOUNTER — Other Ambulatory Visit (HOSPITAL_COMMUNITY): Payer: Self-pay

## 2015-09-23 ENCOUNTER — Ambulatory Visit (INDEPENDENT_AMBULATORY_CARE_PROVIDER_SITE_OTHER): Payer: PPO | Admitting: Family Medicine

## 2015-09-23 ENCOUNTER — Encounter: Payer: Self-pay | Admitting: Family Medicine

## 2015-09-23 VITALS — BP 122/84 | Ht 65.0 in | Wt 215.2 lb

## 2015-09-23 DIAGNOSIS — M7122 Synovial cyst of popliteal space [Baker], left knee: Secondary | ICD-10-CM | POA: Diagnosis not present

## 2015-09-23 DIAGNOSIS — M79662 Pain in left lower leg: Secondary | ICD-10-CM

## 2015-09-23 NOTE — Progress Notes (Signed)
   Subjective:    [name], female    DOB: 11-21-1940, 75 y.o.   MRN: [id number]  HPI Patient arrives with c/o bulge behind left knee for few days -painful today. Patient relates a few days of bulging left knee pain discomfort denies any other particular troubles  Review of Systems     Objective:   Physical Exam There is tenderness in the left calf tenderness behind left knee some swelling noted could be a Baker's cyst no sign of infection the thigh is normal ankle normal right leg normal       Assessment & Plan:  Significant left leg pain swelling tenderness pain in the posterior aspect ultrasound recommended to rule out Baker's cyst. Rule out DVT I do not feel patient needs to be on blood thinner currently

## 2015-09-24 ENCOUNTER — Ambulatory Visit (HOSPITAL_COMMUNITY)
Admission: RE | Admit: 2015-09-24 | Discharge: 2015-09-24 | Disposition: A | Discharge status: Home / Self Care | Payer: PPO | Source: Ambulatory Visit | Attending: Family Medicine | Admitting: Family Medicine

## 2015-09-24 DIAGNOSIS — M7122 Synovial cyst of popliteal space [Baker], left knee: Secondary | ICD-10-CM | POA: Diagnosis not present

## 2015-09-24 DIAGNOSIS — M79662 Pain in left lower leg: Secondary | ICD-10-CM | POA: Diagnosis not present

## 2015-10-01 ENCOUNTER — Inpatient Hospital Stay: Admit: 2015-10-01 | Payer: PPO | Admitting: Orthopedic Surgery

## 2015-10-01 SURGERY — ARTHROPLASTY, KNEE, TOTAL
Anesthesia: Spinal | Laterality: Right

## 2015-10-11 DIAGNOSIS — R51 Headache: Secondary | ICD-10-CM | POA: Diagnosis not present
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
## 2015-10-16 ENCOUNTER — Encounter: Payer: Self-pay | Admitting: Cardiovascular Disease

## 2015-10-16 ENCOUNTER — Ambulatory Visit (INDEPENDENT_AMBULATORY_CARE_PROVIDER_SITE_OTHER): Payer: PPO | Admitting: Cardiovascular Disease

## 2015-10-16 VITALS — BP 126/72 | HR 86 | Ht 65.0 in | Wt 222.0 lb

## 2015-10-16 DIAGNOSIS — R0602 Shortness of breath: Secondary | ICD-10-CM

## 2015-10-16 MED ORDER — HYDROCHLOROTHIAZIDE 25 MG PO TABS: 25.0000 mg | ORAL_TABLET | Freq: Every day | ORAL | 3 refills | Status: DC

## 2015-10-16 NOTE — Patient Instructions (Signed)
Your physician recommends that you schedule a follow-up appointment in: 4-6 with Dr. Bronson Ing.  Your physician has recommended you make the following change in your medication: Start HCTZ 25 mg Daily  Your physician has requested that you have an echocardiogram. Echocardiography is a painless test that uses sound waves to create images of your heart. It provides your doctor with information about the size and shape of your heart and how well your heart's chambers and valves are working. This procedure takes approximately one hour. There are no restrictions for this procedure.  If you need a refill on your cardiac medications before your next appointment, please call your pharmacy.  Thank you for choosing [facility]!

## 2015-10-18 ENCOUNTER — Ambulatory Visit (INDEPENDENT_AMBULATORY_CARE_PROVIDER_SITE_OTHER): Payer: PPO

## 2015-10-18 ENCOUNTER — Ambulatory Visit (INDEPENDENT_AMBULATORY_CARE_PROVIDER_SITE_OTHER): Payer: PPO | Admitting: Podiatry

## 2015-10-18 ENCOUNTER — Encounter: Payer: Self-pay | Admitting: Podiatry

## 2015-10-18 VITALS — BP 162/85 | HR 76 | Resp 16

## 2015-10-18 DIAGNOSIS — Z9889 Other specified postprocedural states: Secondary | ICD-10-CM

## 2015-10-18 DIAGNOSIS — M779 Enthesopathy, unspecified: Secondary | ICD-10-CM

## 2015-10-18 DIAGNOSIS — M2042 Other hammer toe(s) (acquired), left foot: Secondary | ICD-10-CM | POA: Diagnosis not present

## 2015-10-18 DIAGNOSIS — M2040 Other hammer toe(s) (acquired), unspecified foot: Secondary | ICD-10-CM

## 2015-10-18 NOTE — Progress Notes (Signed)
Subjective:     [name], female   DOB: Jan 01, 1941, 75 y.o.   MRN: [id number]  HPI patient presents stating that she was doing well but her second toe in the top of her feet get sore   Review of Systems     Objective:   Physical Exam Neurovascular status intact muscle strength adequate second toe and excellent alignment with discomfort of the dorsum of both feet with inflammation and fluid buildup noted there is inflammatory component to this    Assessment:     Doing well post surgery of left foot with good alignment noted and inflammation dorsal bilateral that's doing well         Plan:     Review tendinitis and discussed possible injections in future and x-ray and patient's discharge and less she needs any more help  X-rays indicate good alignment of the second toe that has slight arthritis of the distal interphalangeal joint

## 2015-10-23 ENCOUNTER — Ambulatory Visit (INDEPENDENT_AMBULATORY_CARE_PROVIDER_SITE_OTHER): Payer: PPO | Admitting: Family Medicine

## 2015-10-23 ENCOUNTER — Encounter: Payer: Self-pay | Admitting: Family Medicine

## 2015-10-23 VITALS — BP 132/78 | Ht 65.0 in | Wt 213.8 lb

## 2015-10-23 DIAGNOSIS — M159 Polyosteoarthritis, unspecified: Secondary | ICD-10-CM

## 2015-10-23 DIAGNOSIS — M8949 Other hypertrophic osteoarthropathy, multiple sites: Secondary | ICD-10-CM

## 2015-10-23 DIAGNOSIS — M150 Primary generalized (osteo)arthritis: Secondary | ICD-10-CM | POA: Diagnosis not present

## 2015-10-23 MED ORDER — POTASSIUM CHLORIDE ER 10 MEQ PO TBCR: 10.0000 meq | EXTENDED_RELEASE_TABLET | Freq: Every day | ORAL | 11 refills | Status: DC

## 2015-10-23 MED ORDER — TRAMADOL HCL 50 MG PO TABS: 50.0000 mg | ORAL_TABLET | Freq: Three times a day (TID) | ORAL | 2 refills | Status: DC

## 2015-10-23 NOTE — Progress Notes (Signed)
   Subjective:    Patient ID: [name], female    DOB: 08-03-1940, 75 y.o.   MRN: [id number]  HPI  Patient arrives with c/o high blood pressure at times. Patient has decided against knee replacement and is in a lot of pain. Patient stated she has restarted tramadol for one week and thinks the blood pressure reading are related to that. Patient is been under a lot of stress not been active she decided not to do surgery on her knee because she has to much going on currently she might consider getting it done in the spring  She states tramadol when she used it several days in a row cause a little bit of fluid retention which scared her her cardiologist recommended that she take a hydrochlorothiazide when she has fluid retention. Patient does not tolerate other medicines well. Review of Systems     Objective:   Physical Exam Extremities no edema pulse normal heart rate normal blood pressure taken 134/72       Assessment & Plan:  Elevated blood pressure probably related to stress and related to inactivity.  Arthralgias fibromyalgia left knee pain recommend tramadol maximum 3 times per day as needed for pain if fluid retention occurs then take hydrochlorothiazide when taking hydrochlorothiazide must take a potassium with it  Patient under a lot of stress because her son has autism and mental health issues.  No additional blood pressure medicine at this visit. Follow-up in 4 weeks

## 2015-10-30 ENCOUNTER — Ambulatory Visit (HOSPITAL_COMMUNITY)
Admission: RE | Admit: 2015-10-30 | Discharge: 2015-10-30 | Disposition: A | Discharge status: Home / Self Care | Payer: PPO | Source: Ambulatory Visit | Attending: Cardiovascular Disease | Admitting: Cardiovascular Disease

## 2015-10-30 DIAGNOSIS — R0602 Shortness of breath: Secondary | ICD-10-CM | POA: Diagnosis not present

## 2015-10-30 NOTE — Progress Notes (Signed)
*  PRELIMINARY RESULTS* Echocardiogram 2D Echocardiogram has been performed.  Ruth [name] 10/30/2015, 4:08 PM

## 2015-11-18 ENCOUNTER — Ambulatory Visit (INDEPENDENT_AMBULATORY_CARE_PROVIDER_SITE_OTHER): Payer: PPO | Admitting: Cardiovascular Disease

## 2015-11-18 ENCOUNTER — Encounter: Payer: Self-pay | Admitting: Cardiovascular Disease

## 2015-11-18 VITALS — BP 128/64 | HR 77 | Ht 65.0 in | Wt 215.0 lb

## 2015-11-18 DIAGNOSIS — Z136 Encounter for screening for cardiovascular disorders: Secondary | ICD-10-CM

## 2015-11-18 DIAGNOSIS — R600 Localized edema: Secondary | ICD-10-CM | POA: Diagnosis not present

## 2015-11-18 DIAGNOSIS — E7800 Pure hypercholesterolemia, unspecified: Secondary | ICD-10-CM

## 2015-11-18 DIAGNOSIS — Z01818 Encounter for other preprocedural examination: Secondary | ICD-10-CM | POA: Diagnosis not present

## 2015-11-18 NOTE — Progress Notes (Signed)
SUBJECTIVE: The patient is a 75 year old woman with a history of hyperlipidemia, fibromyalgia, and sleep apnea. I saw her in November 2016 and said she could follow-up as needed.  She underwent normal coronary angiography on 12/09/00 by Dr. Nicki Guadalajarahomas Kelly.  She saw Dr. Eden EmmsNishan 8/30 for preop risk stratification for right TKR. She had palpitations and an echocardiogram was ordered which demonstrated normal left ventricular systolic function and regional wall motion, LVEF 60-65%, with moderate LVH (10/30/15).  She said her primary question was whether or not she could take tramadol for fibromyalgia. She said it caused an elevation in her blood pressure and resting heart rate to 100-106 bpm.  Decided to postpone surgery.   Review of Systems: As per "subjective", otherwise negative.  Allergies  Allergen Reactions  . Blood-Group Specific Substance     NO BLOOD PRODUCTS-refuses transfusions  . Ciprofloxacin Other (See Comments)    Pt reports extreme fatigue  . Codeine Nausea And Vomiting  . Vicodin [Hydrocodone-Acetaminophen]     Nausea and vomiting    Current Outpatient Prescriptions  Medication Sig Dispense Refill  . albuterol (PROVENTIL HFA;VENTOLIN HFA) 108 (90 BASE) MCG/ACT inhaler Inhale 2 puffs into the lungs every 6 (six) hours as needed for wheezing. 1 Inhaler 2  . b complex vitamins tablet Take 1 tablet by mouth daily.      Marland Kitchen. CALCIUM PO Take 1 tablet by mouth daily. Reported on 08/29/2015    . Cholecalciferol (VITAMIN D) 2000 UNITS CAPS Take by mouth. Vitamin D3 2000 units    . Coenzyme Q10 (CO Q 10) 10 MG CAPS Take 1 capsule by mouth daily.      . diazepam (VALIUM) 5 MG tablet Take 5 mg by mouth daily as needed for muscle spasms. Reported on 08/29/2015    . dicyclomine (BENTYL) 10 MG capsule Take 1 capsule (10 mg total) by mouth 3 (three) times daily as needed. For stomach cramps 90 capsule 5  . fluticasone (FLONASE) 50 MCG/ACT nasal spray Place 2 sprays into both nostrils  daily.    Marland Kitchen. glucosamine-chondroitin 500-400 MG tablet Take 1 tablet by mouth 2 (two) times a week.     Marland Kitchen. KRILL OIL PO Take 1 tablet by mouth daily.     Marland Kitchen. MAGNESIUM PO Take 1,300 mg by mouth daily.     . nitroGLYCERIN (NITROSTAT) 0.4 MG SL tablet Place 0.4 mg under the tongue every 5 (five) minutes as needed for chest pain.    . Probiotic Product (PROBIOTIC DAILY PO) Take 1 capsule by mouth daily.     . vitamin B-12 (CYANOCOBALAMIN) 100 MCG tablet Take 100 mcg by mouth daily. Reported on 08/29/2015    . vitamin C (ASCORBIC ACID) 500 MG tablet Take 500-1,000 mg by mouth daily.       No current facility-administered medications for this visit.     Past Medical History:  Diagnosis Date  . Anxiety   . Arthritis   . Depression   . Fibromyalgia   . GERD (gastroesophageal reflux disease)    EGD/ colon 1/09  . H pylori ulcer 1980-1990   s/p treatment  . Headache   . Hyperlipidemia   . IBS (irritable bowel syndrome)   . Neck pain   . OSA (obstructive sleep apnea)   . Refusal of blood transfusions as patient is Jehovah's Witness   . Vertigo     Past Surgical History:  Procedure Laterality Date  . ABDOMINAL HYSTERECTOMY  1970  . APPENDECTOMY    .  BILATERAL SALPINGOOPHORECTOMY  1990s  . CARDIAC CATHETERIZATION  2002   [facility]) normal coronary arteries   . COLONOSCOPY  01/2007   Dr. Meriel Flavors  . COLONOSCOPY N/A 04/18/2015   Procedure: COLONOSCOPY;  Surgeon: Rogene Houston, MD;  [facility];  Service: Endoscopy;  Laterality: N/A;  830 - moved to 8:55 - Ann to notify pt  . COLONOSCOPY WITH ESOPHAGOGASTRODUODENOSCOPY (EGD)  02/16/2012   Procedure: COLONOSCOPY WITH ESOPHAGOGASTRODUODENOSCOPY (EGD);  Surgeon: Daneil Dolin, MD;  [facility];  Service: Endoscopy;  Laterality: N/A;  8:45  . ESOPHAGEAL DILATION N/A 06/07/2014   Procedure: ESOPHAGEAL DILATION;  Surgeon: Rogene Houston, MD;  [facility];  Service: Endoscopy;  Laterality: N/A;  .  ESOPHAGOGASTRODUODENOSCOPY  01/2007   Dr Sharlett Iles- 3 cm hiatal hernia, benign esophageal biopsies, erosive esophagitis, gastritis  . ESOPHAGOGASTRODUODENOSCOPY N/A 06/07/2014   Procedure: ESOPHAGOGASTRODUODENOSCOPY (EGD);  Surgeon: Rogene Houston, MD;  [facility];  Service: Endoscopy;  Laterality: N/A;  1200  . INNER EAR SURGERY     Left  . KNEE SURGERY     Multiple  . SHOULDER SURGERY  2011  . TONSILLECTOMY    . TUBAL LIGATION      Social History   Social History  . Marital status: Married    Spouse name: N/A  . Number of children: 2  . Years of education: N/A   Occupational History  . Retired; Media planner    Social History Main Topics  . Smoking status: Never Smoker  . Smokeless tobacco: Never Used  . Alcohol use 0.0 oz/week     Comment: Occasionally, holidays/special occasions  . Drug use: No  . Sexual activity: Yes    Partners: Male     Comment: Hysterectomy   Other Topics Concern  . Not on file   Social History Narrative   Married   2 grown children, 1 adopted son-autistic-lives next door     Vitals:   11/18/15 0921  BP: 128/64  Pulse: 77  SpO2: 95%  Weight: 215 lb (97.5 kg)  Height: 5\' 5"  (1.651 m)    PHYSICAL EXAM General: NAD HEENT: Normal. Neck: No JVD, no thyromegaly. Lungs: Clear to auscultation bilaterally with normal respiratory effort. CV: Nondisplaced PMI.  Regular rate and rhythm, normal S1/S2, no S3/S4, no murmur. Trace periankle edema.     Abdomen: Obese.  Neurologic: Alert and oriented.  Psych: Normal affect.    ECG: Most recent ECG reviewed.      ASSESSMENT AND PLAN: 1. Hyperlipidemia: Refuses treatment.  2. Palpitations: Echo showed normal LVEF. No further testing indicated at this time.  3. Preop risk assessment: Low risk for planned surgery.  4. Leg edema: Likely due to obesity and venous varicosities. LVEF is normal.  Dispo: fu prn.   Kate Sable, M.D., F.A.C.C.

## 2015-11-18 NOTE — Patient Instructions (Signed)
Your physician recommends that you schedule a follow-up appointment in:  As needed        Thank you for choosing [facility] !         

## 2015-11-21 ENCOUNTER — Encounter: Payer: Self-pay | Admitting: Family Medicine

## 2015-11-21 ENCOUNTER — Ambulatory Visit (INDEPENDENT_AMBULATORY_CARE_PROVIDER_SITE_OTHER): Payer: PPO | Admitting: Family Medicine

## 2015-11-21 VITALS — BP 124/84 | Ht 65.0 in | Wt 215.6 lb

## 2015-11-21 DIAGNOSIS — R7303 Prediabetes: Secondary | ICD-10-CM | POA: Diagnosis not present

## 2015-11-21 DIAGNOSIS — R358 Other polyuria: Secondary | ICD-10-CM

## 2015-11-21 DIAGNOSIS — R3589 Other polyuria: Secondary | ICD-10-CM

## 2015-11-21 LAB — POCT URINALYSIS DIPSTICK
Spec Grav, UA: 1.015
pH, UA: 6

## 2015-11-21 LAB — POCT GLUCOSE (DEVICE FOR HOME USE): POC Glucose: 100 mg/dl — AB (ref 70–99)

## 2015-11-21 MED ORDER — FLUOXETINE HCL 10 MG PO TABS: 10.0000 mg | ORAL_TABLET | Freq: Every day | ORAL | 3 refills | Status: DC

## 2015-11-21 NOTE — Progress Notes (Signed)
   Subjective:    [name], female    DOB: [birth year], 75 y.o.   MRN: [id number]  HPI  Patient arrives for a follow up on blood pressure.Patient denies chest tightness pressure pain went to the cardiologist had echo everything seemed to be good   Patient would also like to have urine checked and would like to discuss she denies high fever chills sweats. Denies hematuria patient wonders if it could be sugar causing her increased urination she does have a history of prediabetes  P patient atient does not drink caffeine's stress levels acceptable patient needs refill on Prozac  Review of Systems See above. No hematuria no fevers no flank pain    Objective:   Physical Exam Lungs clear heart regular HEENT benign urinalysis negative  If she needs the Senior"flu shot.     Assessment & Plan:  I recommend that she get Senior flu shot at the pharmacy Polyuria probably overactive bladder scan this with urologist when she sees them later this year   blood pressure stable follow-up within 6 months

## 2015-11-23 LAB — URINE CULTURE

## 2015-11-25 MED ORDER — OXYBUTYNIN CHLORIDE ER 5 MG PO TB24: 5.0000 mg | ORAL_TABLET | Freq: Every day | ORAL | 4 refills | Status: DC

## 2015-11-25 NOTE — Addendum Note (Signed)
Addended by: Dairl Ponder on: 11/25/2015 01:16 PM   Modules accepted: Orders

## 2015-12-31 ENCOUNTER — Encounter: Payer: Self-pay | Admitting: Family Medicine

## 2015-12-31 ENCOUNTER — Ambulatory Visit (INDEPENDENT_AMBULATORY_CARE_PROVIDER_SITE_OTHER): Payer: PPO | Admitting: Family Medicine

## 2015-12-31 VITALS — BP 122/74 | Ht 65.0 in | Wt 214.0 lb

## 2015-12-31 DIAGNOSIS — B9789 Other viral agents as the cause of diseases classified elsewhere: Secondary | ICD-10-CM | POA: Diagnosis not present

## 2015-12-31 DIAGNOSIS — M170 Bilateral primary osteoarthritis of knee: Secondary | ICD-10-CM | POA: Insufficient documentation

## 2015-12-31 DIAGNOSIS — J069 Acute upper respiratory infection, unspecified: Secondary | ICD-10-CM | POA: Diagnosis not present

## 2015-12-31 MED ORDER — HYDROCODONE-ACETAMINOPHEN 5-325 MG PO TABS: 1.0000 | ORAL_TABLET | Freq: Two times a day (BID) | ORAL | 0 refills | Status: DC | PRN

## 2015-12-31 NOTE — Progress Notes (Signed)
   Subjective:    [name], female    DOB: 1941/01/25, 75 y.o.   MRN: [id number]  HPI Patient is here today to discuss starting back on hydrocodone. Suffers with osteoarthritis at one time she took this medicine it causes nausea then she switched to tramadol that she had side effects she would like to go back to hydrocodone she states she had some leftover she was taking it with food and water and did not get nauseous  Patient is also taking Prozac 10 mg 4 daily.States her moods overall are doing good denies being depressed  Patient also has sore throat, chest congestion and ear pain. Onset this morning. Patient denies high fever chills sweats wheezing difficulty breathing   Review of Systems See above. No vomiting fever chills no diarrhea    Objective:   Physical Exam  Throat normal ears normal neck no masses lungs clear heart regular pulse normal      Assessment & Plan:  Moods are stable continue current measures with Prozac Chronic fibromyalgia mild exercises and stretching recommended Osteoarthritis of the knees hydrocodone may be used up to twice a day 3 prescriptions were given patient states that when she was taking this help reduce her pain and improve her function. Other measures have been tried risk of addiction discussed in detail safety with medication discussed as well Viral illness should gradually get better warning signs discussed

## 2016-01-01 DIAGNOSIS — H7202 Central perforation of tympanic membrane, left ear: Secondary | ICD-10-CM | POA: Diagnosis not present

## 2016-01-01 DIAGNOSIS — H903 Sensorineural hearing loss, bilateral: Secondary | ICD-10-CM | POA: Diagnosis not present

## 2016-01-01 DIAGNOSIS — H6983 Other specified disorders of Eustachian tube, bilateral: Secondary | ICD-10-CM | POA: Diagnosis not present

## 2016-01-03 ENCOUNTER — Telehealth: Payer: Self-pay | Admitting: Family Medicine

## 2016-01-03 MED ORDER — DOXYCYCLINE HYCLATE 100 MG PO TABS: ORAL_TABLET | ORAL | 0 refills | Status: DC

## 2016-01-03 NOTE — Telephone Encounter (Signed)
Spoke with patient's husband and informed him per Dr.Scott Luking- we sent in Doxycycline. Patient to take 1 tablet twice a day for 10 days with food and tall glass of water. Patient's husband verbalized understanding.

## 2016-01-03 NOTE — Telephone Encounter (Signed)
Doxycycline 100 mg 1 twice a day 10 days with food and a tall glass of water

## 2016-01-03 NOTE — Telephone Encounter (Signed)
Patient states no better still having chest congestion,low grade fever 99 ,ear pain. Call in something else to [place]

## 2016-01-03 NOTE — Telephone Encounter (Signed)
Left message return call 01/03/16 (medication sent into pharmacy)

## 2016-01-14 ENCOUNTER — Encounter: Payer: Self-pay | Admitting: Family Medicine

## 2016-01-14 ENCOUNTER — Ambulatory Visit (INDEPENDENT_AMBULATORY_CARE_PROVIDER_SITE_OTHER): Payer: PPO | Admitting: Family Medicine

## 2016-01-14 VITALS — BP 142/70 | Ht 65.0 in | Wt 210.4 lb

## 2016-01-14 DIAGNOSIS — R030 Elevated blood-pressure reading, without diagnosis of hypertension: Secondary | ICD-10-CM

## 2016-01-14 NOTE — Patient Instructions (Addendum)
Bring blood pressure cuffs with you     DASH Eating Plan DASH stands for "Dietary Approaches to Stop Hypertension." The DASH eating plan is a healthy eating plan that has been shown to reduce high blood pressure (hypertension). Additional health benefits may include reducing the risk of type 2 diabetes mellitus, heart disease, and stroke. The DASH eating plan may also help with weight loss. What do I need to know about the DASH eating plan? For the DASH eating plan, you will follow these general guidelines:  Choose foods with less than 150 milligrams of sodium per serving (as listed on the food label).  Use salt-free seasonings or herbs instead of table salt or sea salt.  Check with your health care provider or pharmacist before using salt substitutes.  Eat lower-sodium products. These are often labeled as "low-sodium" or "no salt added."  Eat fresh foods. Avoid eating a lot of canned foods.  Eat more vegetables, fruits, and low-fat dairy products.  Choose whole grains. Look for the word "whole" as the first word in the ingredient list.  Choose fish and skinless chicken or [place] more often than red meat. Limit fish, poultry, and meat to 6 oz (170 g) each day.  Limit sweets, desserts, sugars, and sugary drinks.  Choose heart-healthy fats.  Eat more home-cooked food and less restaurant, buffet, and fast food.  Limit fried foods.  Do not fry foods. Cook foods using methods such as baking, boiling, grilling, and broiling instead.  When eating at a restaurant, ask that your food be prepared with less salt, or no salt if possible. What foods can I eat? Seek help from a dietitian for individual calorie needs. Grains  Whole grain or whole wheat bread. Brown rice. Whole grain or whole wheat pasta. Quinoa, bulgur, and whole grain cereals. Low-sodium cereals. Corn or whole wheat flour tortillas. Whole grain cornbread. Whole grain crackers. Low-sodium crackers. Vegetables  Fresh or  frozen vegetables (raw, steamed, roasted, or grilled). Low-sodium or reduced-sodium tomato and vegetable juices. Low-sodium or reduced-sodium tomato sauce and paste. Low-sodium or reduced-sodium canned vegetables. Fruits  All fresh, canned (in natural juice), or frozen fruits. Meat and Other Protein Products  Ground beef (85% or leaner), grass-fed beef, or beef trimmed of fat. Skinless chicken or [place]. Ground chicken or [place]. Pork trimmed of fat. All fish and seafood. Eggs. Dried beans, peas, or lentils. Unsalted nuts and seeds. Unsalted canned beans. Dairy  Low-fat dairy products, such as skim or 1% milk, 2% or reduced-fat cheeses, low-fat ricotta or cottage cheese, or plain low-fat yogurt. Low-sodium or reduced-sodium cheeses. Fats and Oils  Tub margarines without trans fats. Light or reduced-fat mayonnaise and salad dressings (reduced sodium). Avocado. Safflower, olive, or canola oils. Natural peanut or almond butter. Other  Unsalted popcorn and pretzels. The items listed above may not be a complete list of recommended foods or beverages. Contact your dietitian for more options.  What foods are not recommended? Grains  White bread. White pasta. White rice. Refined cornbread. Bagels and croissants. Crackers that contain trans fat. Vegetables  Creamed or fried vegetables. Vegetables in a cheese sauce. Regular canned vegetables. Regular canned tomato sauce and paste. Regular tomato and vegetable juices. Fruits  Canned fruit in light or heavy syrup. Fruit juice. Meat and Other Protein Products  Fatty cuts of meat. Ribs, chicken wings, bacon, sausage, bologna, salami, chitterlings, fatback, hot dogs, bratwurst, and packaged luncheon meats. Salted nuts and seeds. Canned beans with salt. Dairy  Whole or 2% milk, cream,  half-and-half, and cream cheese. Whole-fat or sweetened yogurt. Full-fat cheeses or blue cheese. Nondairy creamers and whipped toppings. Processed cheese, cheese spreads, or  cheese curds. Condiments  Onion and garlic salt, seasoned salt, table salt, and sea salt. Canned and packaged gravies. Worcestershire sauce. Tartar sauce. Barbecue sauce. Teriyaki sauce. Soy sauce, including reduced sodium. Steak sauce. Fish sauce. Oyster sauce. Cocktail sauce. Horseradish. Ketchup and mustard. Meat flavorings and tenderizers. Bouillon cubes. Hot sauce. Tabasco sauce. Marinades. Taco seasonings. Relishes. Fats and Oils  Butter, stick margarine, lard, shortening, ghee, and bacon fat. Coconut, palm kernel, or palm oils. Regular salad dressings. Other  Pickles and olives. Salted popcorn and pretzels. The items listed above may not be a complete list of foods and beverages to avoid. Contact your dietitian for more information.  Where can I find more information? National Heart, Lung, and Blood Institute: travelstabloid.com This information is not intended to replace advice given to you by your health care provider. Make sure you discuss any questions you have with your health care provider. Document Released: 01/22/2011 Document Revised: 07/11/2015 Document Reviewed: 12/07/2012 Elsevier Interactive Patient Education  2017 [employer].

## 2016-01-14 NOTE — Progress Notes (Signed)
   Subjective:    [name], female    DOB: 12/05/1940, 75 y.o.   MRN: [id number]  Hypertension  The current episode started yesterday. The problem has been gradually worsening since onset. Associated symptoms include headaches. Pertinent negatives include no blurred vision, chest pain, neck pain, orthopnea, palpitations, shortness of breath or sweats.   Patient arrives with c/o blood pressure running high since week end. Patient states she took a sudafed on Saturday but has not taken one since.   Review of Systems  Eyes: Negative for blurred vision.  Respiratory: Negative for shortness of breath.   Cardiovascular: Negative for chest pain, palpitations and orthopnea.  Musculoskeletal: Negative for neck pain.  Neurological: Positive for headaches.       Objective:   Physical Exam Lungs clear hearts regular pulse normal blood pressure slightly elevated not dramatic extremities no edema skin warm dry       Assessment & Plan:  Moderate elevated blood pressure readings at home now current blood pressure is just slightly elevated. I feel the patient is having a mild headache related to recent viral illness. I do not recommend further antibiotics. I do recommend staying away from all decongestants. Also recommend patient to minimize salt stay physically active she should follow-up within the next 3-4 weeks to recheck blood pressure

## 2016-02-03 ENCOUNTER — Telehealth: Payer: Self-pay | Admitting: Family Medicine

## 2016-02-03 NOTE — Telephone Encounter (Signed)
Left message on voicemail to return call.

## 2016-02-03 NOTE — Telephone Encounter (Signed)
Last mammo 02/05/18

## 2016-02-03 NOTE — Telephone Encounter (Signed)
Patient is wanting to know if there is a whooping cough booster?  And she is wanting to know if she is due for a mammogram?  Also, she has not been having a problem with her blood pressure lately, and she wants to know if she needs to keep the appointment she has for tomorrow with Dr. Nicki Reaper?

## 2016-02-03 NOTE — Telephone Encounter (Signed)
#  1 patient is due for a mammogram last 1 2016 #2 it would be fine to move her follow-up appointment back 1 month #3 whooping cough Tdap boosters are typically recommended for older folks who are around young babies.

## 2016-02-04 ENCOUNTER — Ambulatory Visit: Payer: PPO | Admitting: Family Medicine

## 2016-02-04 NOTE — Telephone Encounter (Signed)
Notified patient #1 patient is due for a mammogram last 1 2016 #2 it would be fine to move her follow-up appointment back 1 month #3 whooping cough Tdap boosters are typically recommended for older folks who are around young babies. Patient verbalized understanding. Ruth [name] changed appointment.

## 2016-02-06 ENCOUNTER — Other Ambulatory Visit: Payer: Self-pay | Admitting: Family Medicine

## 2016-02-06 DIAGNOSIS — Z1231 Encounter for screening mammogram for malignant neoplasm of breast: Secondary | ICD-10-CM

## 2016-02-12 DIAGNOSIS — N3941 Urge incontinence: Secondary | ICD-10-CM | POA: Diagnosis not present

## 2016-02-12 DIAGNOSIS — R350 Frequency of micturition: Secondary | ICD-10-CM | POA: Diagnosis not present

## 2016-02-12 DIAGNOSIS — R3121 Asymptomatic microscopic hematuria: Secondary | ICD-10-CM | POA: Diagnosis not present

## 2016-02-13 ENCOUNTER — Ambulatory Visit (HOSPITAL_COMMUNITY)
Admission: RE | Admit: 2016-02-13 | Discharge: 2016-02-13 | Disposition: A | Discharge status: Home / Self Care | Payer: PPO | Source: Ambulatory Visit | Attending: Family Medicine | Admitting: Family Medicine

## 2016-02-13 DIAGNOSIS — Z1231 Encounter for screening mammogram for malignant neoplasm of breast: Secondary | ICD-10-CM

## 2016-03-02 ENCOUNTER — Other Ambulatory Visit: Payer: Self-pay | Admitting: Urology

## 2016-03-02 DIAGNOSIS — N289 Disorder of kidney and ureter, unspecified: Secondary | ICD-10-CM

## 2016-03-11 ENCOUNTER — Ambulatory Visit: Payer: PPO | Admitting: Family Medicine

## 2016-03-20 ENCOUNTER — Ambulatory Visit (HOSPITAL_COMMUNITY)
Admission: RE | Admit: 2016-03-20 | Discharge: 2016-03-20 | Disposition: A | Discharge status: Home / Self Care | Payer: PPO | Source: Ambulatory Visit | Attending: Urology | Admitting: Urology

## 2016-03-20 DIAGNOSIS — K760 Fatty (change of) liver, not elsewhere classified: Secondary | ICD-10-CM | POA: Diagnosis not present

## 2016-03-20 DIAGNOSIS — N289 Disorder of kidney and ureter, unspecified: Secondary | ICD-10-CM | POA: Diagnosis not present

## 2016-03-20 LAB — POCT I-STAT CREATININE: Creatinine, Ser: 0.7 mg/dL (ref 0.44–1.00)

## 2016-03-20 MED ORDER — GADOBENATE DIMEGLUMINE 529 MG/ML IV SOLN
20.0000 mL | Freq: Once | INTRAVENOUS | Status: AC | PRN
  Administered 2016-03-20: 19 mL via INTRAVENOUS

## 2016-03-30 DIAGNOSIS — N281 Cyst of kidney, acquired: Secondary | ICD-10-CM | POA: Diagnosis not present

## 2016-03-30 DIAGNOSIS — N3941 Urge incontinence: Secondary | ICD-10-CM | POA: Diagnosis not present

## 2016-04-01 ENCOUNTER — Ambulatory Visit: Payer: PPO | Admitting: Family Medicine

## 2016-04-14 ENCOUNTER — Ambulatory Visit (INDEPENDENT_AMBULATORY_CARE_PROVIDER_SITE_OTHER): Payer: PPO | Admitting: Family Medicine

## 2016-04-14 ENCOUNTER — Encounter: Payer: Self-pay | Admitting: Family Medicine

## 2016-04-14 VITALS — BP 130/82 | Ht 65.0 in | Wt 215.0 lb

## 2016-04-14 DIAGNOSIS — E7849 Other hyperlipidemia: Secondary | ICD-10-CM

## 2016-04-14 DIAGNOSIS — R7303 Prediabetes: Secondary | ICD-10-CM

## 2016-04-14 DIAGNOSIS — E784 Other hyperlipidemia: Secondary | ICD-10-CM | POA: Diagnosis not present

## 2016-04-14 DIAGNOSIS — R0789 Other chest pain: Secondary | ICD-10-CM | POA: Diagnosis not present

## 2016-04-14 MED ORDER — NITROGLYCERIN 0.4 MG SL SUBL: 0.4000 mg | SUBLINGUAL_TABLET | SUBLINGUAL | 0 refills | Status: DC | PRN

## 2016-04-14 MED ORDER — ASPIRIN 81 MG PO TABS: 81.0000 mg | ORAL_TABLET | Freq: Every day | ORAL | 0 refills | Status: DC

## 2016-04-14 NOTE — Progress Notes (Signed)
   Subjective:    [name], female    DOB: [birth year], 76 y.o.   MRN: [id number]  HPI Patient arrives for a follow up on blood pressure. Patient states she had an episode of chest pain on Saturday that was relieved with nitro and has not returned She relates that over the weekend she had episode of chest discomfort when she was sitting in a pickup she relates left side chest discomfort radiated to her shoulder and down into her arm and also radiated radiated up the neck into the jaw no sweats or chills no shortness of breath with it the symptoms lasted a proximally 45 minutes she took one of her husbands sublingual nitroglycerin and that helped has not returned since then patient does not do any significant physical activity. She does have significant risk factors including severely elevated cholesterol with intolerance of statins Patient does not have a history of heart disease Review of Systems  Constitutional: Negative for activity change, fatigue and fever.  HENT: Negative for congestion.   Respiratory: Positive for chest tightness. Negative for cough and shortness of breath.   Cardiovascular: Positive for chest pain. Negative for leg swelling.  Neurological: Negative for headaches.       Objective:   Physical Exam  Constitutional: She appears well-nourished. No distress.  Cardiovascular: Normal rate, regular rhythm and normal heart sounds.   No murmur heard. Pulmonary/Chest: Effort normal and breath sounds normal. No respiratory distress.  Musculoskeletal: She exhibits no edema.  Lymphadenopathy:    She has no cervical adenopathy.  Neurological: She is alert. She exhibits normal muscle tone.  Psychiatric: Her behavior is normal.  Vitals reviewed.   EKG no acute changes      Assessment & Plan:  Chest pain-this is concerning for the possibility of angina related issues. Patient needs referral to cardiology for urgent follow-up and workup. Sublingual  nitroglycerin prescribed. 80 aspirin daily. If ongoing troubles immediately go to ER  Patient did have cardiology workup within the past 6 months which had a echo completed but has not had a Myoview imaging or catheterization.

## 2016-04-15 ENCOUNTER — Encounter: Payer: Self-pay | Admitting: Family Medicine

## 2016-04-15 ENCOUNTER — Encounter: Payer: Self-pay | Admitting: Physician Assistant

## 2016-04-15 ENCOUNTER — Ambulatory Visit (INDEPENDENT_AMBULATORY_CARE_PROVIDER_SITE_OTHER): Payer: PPO | Admitting: Physician Assistant

## 2016-04-15 ENCOUNTER — Encounter: Payer: Self-pay | Admitting: *Deleted

## 2016-04-15 ENCOUNTER — Ambulatory Visit: Payer: PPO | Admitting: Family Medicine

## 2016-04-15 VITALS — BP 136/62 | HR 75 | Ht 65.0 in | Wt 213.0 lb

## 2016-04-15 DIAGNOSIS — R072 Precordial pain: Secondary | ICD-10-CM

## 2016-04-15 DIAGNOSIS — E784 Other hyperlipidemia: Secondary | ICD-10-CM

## 2016-04-15 DIAGNOSIS — Z8249 Family history of ischemic heart disease and other diseases of the circulatory system: Secondary | ICD-10-CM

## 2016-04-15 DIAGNOSIS — K219 Gastro-esophageal reflux disease without esophagitis: Secondary | ICD-10-CM

## 2016-04-15 DIAGNOSIS — E7849 Other hyperlipidemia: Secondary | ICD-10-CM

## 2016-04-15 NOTE — Patient Instructions (Signed)
Your physician recommends that you schedule a follow-up appointment in: 1 Month with Dr. Bronson Ing.   Your physician recommends that you continue on your current medications as directed. Please refer to the Current Medication list given to you today.  Your physician has requested that you have a lexiscan myoview. For further information please visit [email or web address]. Please follow instruction sheet, as given.  If you need a refill on your cardiac medications before your next appointment, please call your pharmacy.  Thank you for choosing [facility]!

## 2016-04-15 NOTE — Progress Notes (Signed)
Cardiology Office Note    Date:  04/15/2016   ID:  Ruth [name], DOB [birth year], MRN [id number]  PCP:  Sallee Lange, MD  Cardiologist: Dr. Bronson Ing  Chief Complaint  Patient presents with  . Chest Pain    History of Present Illness:  Ruth [name] is a 76 y.o. female  with history of chest pain, normal coronary angiography 2002, hyperlipidemia refuses to try statins, fibromyalgia and sleep apnea. 2-D echo 10/2015 normal LVEF 60-65% with moderate LVH and cleared for total knee replacement which she opted not to have. Also has leg edema felt secondary to obesity and venous varicosities. Family hx of CAD with father dying of an MI in 30's-also smoker.  Referred to [place] today by  Dr.Luking today because of chest pain.The patient was driving in the car with her husband on Saturday. She noticed her left jaw ached a little bit. That one away. She then developed chest pain into left arm associated with some jaw pain. Pain in her chest was described as heaviness and a sharp shooting pain. She took one of her husbands nitroglycerin and the pain eased in 3-5 minutes. She slept most of Sunday but did do some light housework without recurrence. She denies any pain since then. She has not had any recent chest pain. She says this is a little different than her chest pain in the past in that it went into her jaw and left arm. She did eat [place] Friday night and does have a hiatal hernia. She said she didn't having indigestion with this though. She does not want to take cholesterol-lowering medications. No history of hypertension or diabetes. She is not very active and walks with a cane. She canceled her knee surgery twice.    Past Medical History:  Diagnosis Date  . Anxiety   . Arthritis   . Depression   . Fibromyalgia   . GERD (gastroesophageal reflux disease)    EGD/ colon 1/09  . H pylori ulcer 1980-1990   s/p treatment  . Headache   . Hyperlipidemia   . IBS (irritable bowel syndrome)   .  Neck pain   . OSA (obstructive sleep apnea)   . Refusal of blood transfusions as patient is Jehovah's Witness   . Vertigo     Past Surgical History:  Procedure Laterality Date  . ABDOMINAL HYSTERECTOMY  1970  . APPENDECTOMY    . BILATERAL SALPINGOOPHORECTOMY  1990s  . CARDIAC CATHETERIZATION  2002   [facility]) normal coronary arteries   . COLONOSCOPY  01/2007   Dr. Meriel Flavors  . COLONOSCOPY N/A 04/18/2015   Procedure: COLONOSCOPY;  Surgeon: Rogene Houston, MD;  [facility];  Service: Endoscopy;  Laterality: N/A;  830 - moved to 8:55 - Ann to notify pt  . COLONOSCOPY WITH ESOPHAGOGASTRODUODENOSCOPY (EGD)  02/16/2012   Procedure: COLONOSCOPY WITH ESOPHAGOGASTRODUODENOSCOPY (EGD);  Surgeon: Daneil Dolin, MD;  [facility];  Service: Endoscopy;  Laterality: N/A;  8:45  . ESOPHAGEAL DILATION N/A 06/07/2014   Procedure: ESOPHAGEAL DILATION;  Surgeon: Rogene Houston, MD;  [facility];  Service: Endoscopy;  Laterality: N/A;  . ESOPHAGOGASTRODUODENOSCOPY  01/2007   Dr Sharlett Iles- 3 cm hiatal hernia, benign esophageal biopsies, erosive esophagitis, gastritis  . ESOPHAGOGASTRODUODENOSCOPY N/A 06/07/2014   Procedure: ESOPHAGOGASTRODUODENOSCOPY (EGD);  Surgeon: Rogene Houston, MD;  [facility];  Service: Endoscopy;  Laterality: N/A;  1200  . INNER EAR SURGERY     Left  . KNEE  SURGERY     Multiple  . SHOULDER SURGERY  2011  . TONSILLECTOMY    . TUBAL LIGATION      Current Medications: Outpatient Medications Prior to Visit  Medication Sig Dispense Refill  . albuterol (PROVENTIL HFA;VENTOLIN HFA) 108 (90 BASE) MCG/ACT inhaler Inhale 2 puffs into the lungs every 6 (six) hours as needed for wheezing. 1 Inhaler 2  . aspirin 81 MG tablet Take 1 tablet (81 mg total) by mouth daily. 30 tablet 0  . b complex vitamins tablet Take 1 tablet by mouth daily.      Marland Kitchen CALCIUM PO Take 1 tablet by mouth daily. Reported on 08/29/2015    . Cholecalciferol  (VITAMIN D) 2000 UNITS CAPS Take by mouth. Vitamin D3 2000 units    . Coenzyme Q10 (CO Q 10) 10 MG CAPS Take 1 capsule by mouth daily.      . diazepam (VALIUM) 5 MG tablet Take 5 mg by mouth daily as needed for muscle spasms. Reported on 08/29/2015    . dicyclomine (BENTYL) 10 MG capsule Take 1 capsule (10 mg total) by mouth 3 (three) times daily as needed. For stomach cramps 90 capsule 5  . FLUoxetine (PROZAC) 10 MG tablet Take 1 tablet (10 mg total) by mouth daily. (Patient taking differently: Take 40 mg by mouth daily. ) 360 tablet 3  . fluticasone (FLONASE) 50 MCG/ACT nasal spray Place 2 sprays into both nostrils daily.    Marland Kitchen glucosamine-chondroitin 500-400 MG tablet Take 1 tablet by mouth 2 (two) times a week.     Marland Kitchen HYDROcodone-acetaminophen (NORCO/VICODIN) 5-325 MG tablet Take 1 tablet by mouth 2 (two) times daily as needed for moderate pain. 60 tablet 0  . KRILL OIL PO Take 1 tablet by mouth daily.     Marland Kitchen MAGNESIUM PO Take 1,300 mg by mouth daily.     . nitroGLYCERIN (NITROSTAT) 0.4 MG SL tablet Place 1 tablet (0.4 mg total) under the tongue every 5 (five) minutes as needed for chest pain. 20 tablet 0  . Probiotic Product (PROBIOTIC DAILY PO) Take 1 capsule by mouth daily.     . vitamin B-12 (CYANOCOBALAMIN) 100 MCG tablet Take 100 mcg by mouth daily. Reported on 08/29/2015    . vitamin C (ASCORBIC ACID) 500 MG tablet Take 500-1,000 mg by mouth daily.      Marland Kitchen oxybutynin (DITROPAN XL) 5 MG 24 hr tablet Take 1 tablet (5 mg total) by mouth daily. 30 tablet 4   No facility-administered medications prior to visit.      Allergies:   Blood-group specific substance; Ciprofloxacin; Codeine; Vicodin [hydrocodone-acetaminophen]; and Tramadol   Social History   Social History  . Marital status: Married    Spouse name: N/A  . Number of children: 2  . Years of education: N/A   Occupational History  . Retired; Media planner    Social History Main Topics  . Smoking status: Never Smoker  .  Smokeless tobacco: Never Used  . Alcohol use 0.0 oz/week     Comment: Occasionally, holidays/special occasions  . Drug use: No  . Sexual activity: Yes    Partners: Male     Comment: Hysterectomy   Other Topics Concern  . Not on file   Social History Narrative   Married   2 grown children, 1 adopted son-autistic-lives next door     Family History:  The patient's   family history includes Cirrhosis (age of onset: 72) in her father; Colon polyps in her mother; Depression  in her sister; Diabetes in her father and sister; Heart attack in her father; Hypertension in her sister; Lung cancer in her father, sister, and sister; Paranoid behavior in her mother.   ROS:   Please see the history of present illness.    Review of Systems  Constitution: Positive for weakness and malaise/fatigue.  HENT: Negative.   Eyes: Negative.   Cardiovascular: Positive for chest pain.  Respiratory: Negative.   Hematologic/Lymphatic: Negative.   Musculoskeletal: Positive for joint pain, joint swelling, muscle weakness and myalgias.  Gastrointestinal: Negative.   Genitourinary: Negative.    All other systems reviewed and are negative.   PHYSICAL EXAM:   VS:  BP 136/62   Pulse 75   Ht 5\' 5"  (1.651 m)   Wt 213 lb (96.6 kg)   SpO2 92%   BMI 35.45 kg/m   Physical Exam  GEN: Obese, in no acute distress  Neck: no JVD, carotid bruits, or masses Cardiac:RRR; 1/6 systolic murmur at the apex, no rubs, or gallops  Respiratory:  clear to auscultation bilaterally, normal work of breathing GI: soft, nontender, nondistended, + BS Ext: without cyanosis, clubbing, or edema, Good distal pulses bilaterally Psych: euthymic mood, full affect  Wt Readings from Last 3 Encounters:  04/15/16 213 lb (96.6 kg)  04/14/16 215 lb (97.5 kg)  01/14/16 210 lb 6.4 oz (95.4 kg)      Studies/Labs Reviewed:   EKG:  EKG is notordered today.  The ekg reviewed from [place] yesterday and was normal  Recent Labs: 06/11/2015:  ALT 16 07/13/2015: BUN 23; Hemoglobin 12.0; Platelets 244; Potassium 3.6; Sodium 138 07/14/2015: TSH 3.263 03/20/2016: Creatinine, Ser 0.70   Lipid Panel    Component Value Date/Time   CHOL 265 (H) 06/11/2015 1040   TRIG 138 06/11/2015 1040   HDL 59 06/11/2015 1040   CHOLHDL 4.5 (H) 06/11/2015 1040   CHOLHDL 5.7 03/17/2014 0920   VLDL 30 03/17/2014 0920   LDLCALC 178 (H) 06/11/2015 1040    Additional studies/ records that were reviewed today include:  2-D echo 10/30/15  Study Conclusions   - Left ventricle: The cavity size was normal. Wall thickness was   increased in a pattern of moderate LVH. Systolic function was   normal. The estimated ejection fraction was in the range of 60%   to 65%. Wall motion was normal; there were no regional wall   motion abnormalities. Diastolic dysfunction, grade indeterminate.   Indeterminate filling pressures.   Cardiac Cath   Procedure date:  12/09/2000   Findings:      HEMODYNAMIC DATA: 1. Central aortic pressure was 116/59. 2. Left ventricular pressure 116/18.   ANGIOGRAPHIC DATA:   Left main coronary artery was angiographically normal and bifurcated into an LAD and left circumflex system.   The LAD gave rise to a proximal bifurcating diagonal vessel.  The LAD and its branches were angiographically normal.   The circumflex coronary artery gave rise to two circumflex marginal vessels which were angiographically normal.   The right coronary artery was angiographically normal and gave rise to a PDA and ended in a posterolateral vessel.   BIPLANE CINE LEFT VENTRICULOGRAPHY revealed normal LV function without focal segmental wall motion abnormalities.   DISTAL AORTOGRAPHY did not demonstrate any aortoiliac disease.   At the completion of the procedure, Perclose closure device was attempted; however, this resulted in a suboptimal seal.  Consequently, manual pressure was applied for optimal hemostasis  Stress Echocardiogram     Procedure date:  03/06/2009  Findings:      CONCLUSION:   1. Negative exercise treadmill test for ischemia by standard criteria       at a maximum workload of 7 METS.  There was a hypertensive response       with exercise, no chest pain, and no significant arrhythmias.   2. No inducible wall motion abnormalities by echocardiography to       indicate ischemia.      ASSESSMENT:    1. Precordial pain   2. Other hyperlipidemia   3. Family history of early CAD   2. Gastroesophageal reflux disease, esophagitis presence not specified      PLAN:  In order of problems listed above:  Chest pain at rest worrisome for angina. Pain only occurred one time. No exertional symptoms. EKG yesterday was normal. Normal cardiac cath in 2002. She does have been treated hyperlipidemia and family history of early CAD with a father dying in his 71s of an MI. She also ate [place] the night before and has GERD. Recommend Lexi scan Myoview to rule out ischemia.  F/U with Dr. Bronson Ing in 1 month.  Hyperlipidemia cholesterol 265 LDL 178 05/2015. Recommend lipid-lowering agents. Patient's unwilling to take unless she has diagnosed blockages.  Family history of early CAD with her father dying of an MI in his 21s.  GERD with recent chest pain. Not on any medications.    Medication Adjustments/Labs and Tests Ordered: Current medicines are reviewed at length with the patient today.  Concerns regarding medicines are outlined above.  Medication changes, Labs and Tests ordered today are listed in the Patient Instructions below. Patient Instructions  Your physician recommends that you schedule a follow-up appointment in: 1 Month with Dr. Bronson Ing.   Your physician recommends that you continue on your current medications as directed. Please refer to the Current Medication list given to you today.  Your physician has requested that you have a lexiscan myoview. For further information please visit  [email or web address]. Please follow instruction sheet, as given.  If you need a refill on your cardiac medications before your next appointment, please call your pharmacy.  Thank you for choosing [facility]!       Sumner Boast, PA-C  04/15/2016 11:45 AM    [facility] [place], [place], [place]  [address] Phone: [phone]; Fax: [phone]

## 2016-04-17 ENCOUNTER — Encounter (HOSPITAL_COMMUNITY): Payer: Self-pay

## 2016-04-17 ENCOUNTER — Encounter (HOSPITAL_COMMUNITY)
Admission: RE | Admit: 2016-04-17 | Discharge: 2016-04-17 | Disposition: A | Discharge status: Home / Self Care | Payer: PPO | Source: Ambulatory Visit | Attending: Physician Assistant | Admitting: Physician Assistant

## 2016-04-17 ENCOUNTER — Inpatient Hospital Stay (HOSPITAL_COMMUNITY): Admission: RE | Admit: 2016-04-17 | Payer: Self-pay | Source: Ambulatory Visit

## 2016-04-17 DIAGNOSIS — R072 Precordial pain: Secondary | ICD-10-CM | POA: Insufficient documentation

## 2016-04-17 LAB — NM MYOCAR MULTI W/SPECT W/WALL MOTION / EF
LV dias vol: 73 mL (ref 46–106)
LV sys vol: 16 mL
Peak HR: 105 {beats}/min
RATE: 0.26
Rest HR: 68 {beats}/min
SDS: 3
SRS: 0
SSS: 3
TID: 1.04

## 2016-04-17 MED ORDER — REGADENOSON 0.4 MG/5ML IV SOLN
INTRAVENOUS | Status: AC
  Administered 2016-04-17: 0.4 mg via INTRAVENOUS
  Filled 2016-04-17: qty 5

## 2016-04-17 MED ORDER — SODIUM CHLORIDE 0.9% FLUSH
INTRAVENOUS | Status: AC
  Administered 2016-04-17: 10 mL via INTRAVENOUS
  Filled 2016-04-17: qty 10

## 2016-04-17 MED ORDER — TECHNETIUM TC 99M TETROFOSMIN IV KIT
30.0000 | PACK | Freq: Once | INTRAVENOUS | Status: AC | PRN
  Administered 2016-04-17: 30 via INTRAVENOUS

## 2016-04-17 MED ORDER — TECHNETIUM TC 99M TETROFOSMIN IV KIT
10.0000 | PACK | Freq: Once | INTRAVENOUS | Status: AC | PRN
  Administered 2016-04-17: 11 via INTRAVENOUS

## 2016-05-07 ENCOUNTER — Ambulatory Visit (INDEPENDENT_AMBULATORY_CARE_PROVIDER_SITE_OTHER): Payer: PPO | Admitting: Otolaryngology

## 2016-05-07 DIAGNOSIS — H7202 Central perforation of tympanic membrane, left ear: Secondary | ICD-10-CM

## 2016-05-07 DIAGNOSIS — H6983 Other specified disorders of Eustachian tube, bilateral: Secondary | ICD-10-CM | POA: Diagnosis not present

## 2016-05-07 DIAGNOSIS — H6123 Impacted cerumen, bilateral: Secondary | ICD-10-CM | POA: Diagnosis not present

## 2016-05-12 ENCOUNTER — Telehealth: Payer: Self-pay | Admitting: Family Medicine

## 2016-05-12 NOTE — Telephone Encounter (Signed)
Ok times one 

## 2016-05-12 NOTE — Telephone Encounter (Signed)
Pt requesting refill on her HYDROcodone-acetaminophen (NORCO/VICODIN) 5-325 MG tablet  Pt states she used sparingly and is now out  Has appt here 05/19/16 with Dr. Nicki Reaper   Please advise and call pt when done    [facility]

## 2016-05-13 ENCOUNTER — Other Ambulatory Visit: Payer: Self-pay | Admitting: *Deleted

## 2016-05-13 MED ORDER — HYDROCODONE-ACETAMINOPHEN 5-325 MG PO TABS: 1.0000 | ORAL_TABLET | Freq: Two times a day (BID) | ORAL | 0 refills | Status: DC | PRN

## 2016-05-13 NOTE — Telephone Encounter (Signed)
Pt notified script ready for pick up.

## 2016-05-15 ENCOUNTER — Ambulatory Visit: Payer: Self-pay | Admitting: Cardiovascular Disease

## 2016-05-19 ENCOUNTER — Ambulatory Visit (INDEPENDENT_AMBULATORY_CARE_PROVIDER_SITE_OTHER): Payer: PPO | Admitting: Family Medicine

## 2016-05-19 VITALS — Temp 98.2°F | Ht 65.0 in | Wt 215.0 lb

## 2016-05-19 DIAGNOSIS — R109 Unspecified abdominal pain: Secondary | ICD-10-CM | POA: Diagnosis not present

## 2016-05-19 DIAGNOSIS — R3129 Other microscopic hematuria: Secondary | ICD-10-CM

## 2016-05-19 DIAGNOSIS — M797 Fibromyalgia: Secondary | ICD-10-CM | POA: Diagnosis not present

## 2016-05-19 DIAGNOSIS — M79673 Pain in unspecified foot: Secondary | ICD-10-CM | POA: Diagnosis not present

## 2016-05-19 LAB — POCT URINALYSIS DIPSTICK
Blood, UA: 50
Spec Grav, UA: 1.015 (ref 1.030–1.035)
Urobilinogen, UA: 2 — AB (ref ?–2.0)
pH, UA: 5 (ref 5.0–8.0)

## 2016-05-19 MED ORDER — PREGABALIN 25 MG PO CAPS: 25.0000 mg | ORAL_CAPSULE | Freq: Two times a day (BID) | ORAL | 3 refills | Status: DC

## 2016-05-19 NOTE — Progress Notes (Signed)
   Subjective:    [name], female    DOB: 03-Feb-1941, 76 y.o.   MRN: [id number]  Foot Pain  This is a new problem. Episode onset: one year ago since surgery.   LLQ pain. Started last night. Pain kept her awake last night. 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

## 2016-05-25 NOTE — Progress Notes (Signed)
Ruth [name] stated he abdominal pain is doing better at this time

## 2016-05-26 ENCOUNTER — Encounter: Payer: Self-pay | Admitting: Cardiovascular Disease

## 2016-05-26 ENCOUNTER — Ambulatory Visit (INDEPENDENT_AMBULATORY_CARE_PROVIDER_SITE_OTHER): Payer: PPO | Admitting: Cardiovascular Disease

## 2016-05-26 VITALS — BP 134/67 | HR 82 | Ht 65.0 in | Wt 214.0 lb

## 2016-05-26 DIAGNOSIS — R600 Localized edema: Secondary | ICD-10-CM | POA: Diagnosis not present

## 2016-05-26 DIAGNOSIS — E784 Other hyperlipidemia: Secondary | ICD-10-CM

## 2016-05-26 DIAGNOSIS — R072 Precordial pain: Secondary | ICD-10-CM

## 2016-05-26 DIAGNOSIS — E7849 Other hyperlipidemia: Secondary | ICD-10-CM

## 2016-05-26 DIAGNOSIS — Z8249 Family history of ischemic heart disease and other diseases of the circulatory system: Secondary | ICD-10-CM | POA: Diagnosis not present

## 2016-05-26 NOTE — Progress Notes (Signed)
SUBJECTIVE: The patient returns for follow-up after undergoing cardiovascular testing performed for the evaluation of chest pain.  Nuclear stress test 04/17/16 showed no evidence of myocardial ischemia or scar, LVEF 78%.  She has hyperlipidemia and has refused treatment for it. She has leg edema due to obesity and venous varicosities.  She also has a history of fibromyalgia.  The patient denies any symptoms of chest pain, palpitations, shortness of breath, lightheadedness, dizziness, orthopnea, PND, and syncope.     Review of Systems: As per "subjective", otherwise negative.  Allergies  Allergen Reactions  . Blood-Group Specific Substance     NO BLOOD PRODUCTS-refuses transfusions  . Ciprofloxacin Other (See Comments)    Pt reports extreme fatigue  . Codeine Nausea And Vomiting  . Vicodin [Hydrocodone-Acetaminophen]     Nausea and vomiting  . Tramadol     Increased heart rate,increased blood pressure, edema    Current Outpatient Prescriptions  Medication Sig Dispense Refill  . albuterol (PROVENTIL HFA;VENTOLIN HFA) 108 (90 BASE) MCG/ACT inhaler Inhale 2 puffs into the lungs every 6 (six) hours as needed for wheezing. 1 Inhaler 2  . b complex vitamins tablet Take 1 tablet by mouth daily.      Marland Kitchen CALCIUM PO Take 1 tablet by mouth daily. Reported on 08/29/2015    . Cholecalciferol (VITAMIN D) 2000 UNITS CAPS Take by mouth. Vitamin D3 2000 units    . Coenzyme Q10 (CO Q 10) 10 MG CAPS Take 1 capsule by mouth daily.      . diazepam (VALIUM) 5 MG tablet Take 5 mg by mouth daily as needed for muscle spasms. Reported on 08/29/2015    . dicyclomine (BENTYL) 10 MG capsule Take 1 capsule (10 mg total) by mouth 3 (three) times daily as needed. For stomach cramps 90 capsule 5  . FLUoxetine (PROZAC) 10 MG capsule Take 10 mg by mouth 4 (four) times daily.    . fluticasone (FLONASE) 50 MCG/ACT nasal spray Place 2 sprays into both nostrils daily.    Marland Kitchen glucosamine-chondroitin 500-400 MG  tablet Take 1 tablet by mouth 2 (two) times a week.     Marland Kitchen HYDROcodone-acetaminophen (NORCO/VICODIN) 5-325 MG tablet Take 1 tablet by mouth 2 (two) times daily as needed for moderate pain. 60 tablet 0  . KRILL OIL PO Take 1 tablet by mouth daily.     Marland Kitchen MAGNESIUM PO Take 1,300 mg by mouth daily.     . nitroGLYCERIN (NITROSTAT) 0.4 MG SL tablet Place 1 tablet (0.4 mg total) under the tongue every 5 (five) minutes as needed for chest pain. 20 tablet 0  . pregabalin (LYRICA) 25 MG capsule Take 1 capsule (25 mg total) by mouth 2 (two) times daily. 60 capsule 3  . Probiotic Product (PROBIOTIC DAILY PO) Take 1 capsule by mouth daily.     . vitamin B-12 (CYANOCOBALAMIN) 100 MCG tablet Take 100 mcg by mouth daily. Reported on 08/29/2015    . vitamin C (ASCORBIC ACID) 500 MG tablet Take 500-1,000 mg by mouth daily.       No current facility-administered medications for this visit.     Past Medical History:  Diagnosis Date  . Anxiety   . Arthritis   . Depression   . Fibromyalgia   . GERD (gastroesophageal reflux disease)    EGD/ colon 1/09  . H pylori ulcer 1980-1990   s/p treatment  . Headache   . Hyperlipidemia   . IBS (irritable bowel syndrome)   . Neck pain   .  OSA (obstructive sleep apnea)   . Refusal of blood transfusions as patient is Jehovah's Witness   . Vertigo     Past Surgical History:  Procedure Laterality Date  . ABDOMINAL HYSTERECTOMY  1970  . APPENDECTOMY    . BILATERAL SALPINGOOPHORECTOMY  1990s  . CARDIAC CATHETERIZATION  2002   [facility]) normal coronary arteries   . COLONOSCOPY  01/2007   Dr. Meriel Flavors  . COLONOSCOPY N/A 04/18/2015   Procedure: COLONOSCOPY;  Surgeon: Rogene Houston, MD;  [facility];  Service: Endoscopy;  Laterality: N/A;  830 - moved to 8:55 - Ann to notify pt  . COLONOSCOPY WITH ESOPHAGOGASTRODUODENOSCOPY (EGD)  02/16/2012   Procedure: COLONOSCOPY WITH ESOPHAGOGASTRODUODENOSCOPY (EGD);  Surgeon: Daneil Dolin, MD;  [facility];  Service: Endoscopy;  Laterality: N/A;  8:45  . ESOPHAGEAL DILATION N/A 06/07/2014   Procedure: ESOPHAGEAL DILATION;  Surgeon: Rogene Houston, MD;  [facility];  Service: Endoscopy;  Laterality: N/A;  . ESOPHAGOGASTRODUODENOSCOPY  01/2007   Dr Sharlett Iles- 3 cm hiatal hernia, benign esophageal biopsies, erosive esophagitis, gastritis  . ESOPHAGOGASTRODUODENOSCOPY N/A 06/07/2014   Procedure: ESOPHAGOGASTRODUODENOSCOPY (EGD);  Surgeon: Rogene Houston, MD;  [facility];  Service: Endoscopy;  Laterality: N/A;  1200  . INNER EAR SURGERY     Left  . KNEE SURGERY     Multiple  . SHOULDER SURGERY  2011  . TONSILLECTOMY    . TUBAL LIGATION      Social History   Social History  . Marital status: Married    Spouse name: N/A  . Number of children: 2  . Years of education: N/A   Occupational History  . Retired; Media planner    Social History Main Topics  . Smoking status: Never Smoker  . Smokeless tobacco: Never Used  . Alcohol use 0.0 oz/week     Comment: Occasionally, holidays/special occasions  . Drug use: No  . Sexual activity: Yes    Partners: Male     Comment: Hysterectomy   Other Topics Concern  . Not on file   Social History Narrative   Married   2 grown children, 1 adopted son-autistic-lives next door     Vitals:   05/26/16 1407  BP: 134/67  Pulse: 82  SpO2: 97%  Weight: 214 lb (97.1 kg)  Height: 5\' 5"  (1.651 m)    Wt Readings from Last 3 Encounters:  05/26/16 214 lb (97.1 kg)  05/19/16 215 lb (97.5 kg)  04/15/16 213 lb (96.6 kg)     PHYSICAL EXAM General: NAD HEENT: Normal. Neck: No JVD, no thyromegaly. Lungs: Clear to auscultation bilaterally with normal respiratory effort. CV: Nondisplaced PMI.  Regular rate and rhythm, normal S1/S2, no S3/S4, no murmur. No pretibial or periankle edema.  No carotid bruit.   Abdomen: Soft, nontender, no distention.  Neurologic: Alert and oriented.  Psych: Normal affect. Skin:  Normal. Musculoskeletal: No gross deformities.    ECG: Most recent ECG reviewed.   Labs: Lab Results  Component Value Date/Time   K 3.6 07/13/2015 07:45 PM   K 4.9 01/08/2012 08:12 AM   BUN 23 (H) 07/13/2015 07:45 PM   BUN 15 11/30/2014 09:14 AM   CREATININE 0.70 03/20/2016 09:36 AM   CREATININE 0.65 03/17/2014 09:20 AM   ALT 16 06/11/2015 10:40 AM   TSH 3.263 07/14/2015 12:43 AM   TSH 2.431 03/02/2013 09:15 AM   HGB 12.0 07/13/2015 07:45 PM     Lipids: Lab Results  Component Value Date/Time   LDLCALC 178 (H) 06/11/2015 10:40 AM   CHOL 265 (H) 06/11/2015 10:40 AM   TRIG 138 06/11/2015 10:40 AM   HDL 59 06/11/2015 10:40 AM       ASSESSMENT AND PLAN: 1. Chest pain: No recurrences. No evidence of myocardial ischemia by nuclear stress testing as detailed above. No further testing indicated.  2. Hyperlipidemia: Refuses treatment.  3. Leg edema: Likely due to obesity and venous varicosities.    Disposition: Follow up prn  Kate Sable, M.D., F.A.C.C.

## 2016-05-26 NOTE — Patient Instructions (Signed)
Medication Instructions:  Your physician recommends that you continue on your current medications as directed. Please refer to the Current Medication list given to you today.   Labwork: NONE  Testing/Procedures: NONE  Follow-Up: Your physician recommends that you schedule a follow-up appointment in: AS NEEDED      Any Other Special Instructions Will Be Listed Below (If Applicable).     If you need a refill on your cardiac medications before your next appointment, please call your pharmacy.   

## 2016-05-29 ENCOUNTER — Encounter: Payer: Self-pay | Admitting: Podiatry

## 2016-06-01 NOTE — Progress Notes (Signed)
Patient picked CD of x-rays requested by [facility].

## 2016-06-02 ENCOUNTER — Telehealth: Payer: Self-pay | Admitting: Family Medicine

## 2016-06-02 NOTE — Telephone Encounter (Signed)
Patient said she is unsteady and her gait is off.  She said the Lyrica she was prescribed seems to be making her feel like this and she wants to know what Dr. Nicki Reaper thinks.

## 2016-06-02 NOTE — Telephone Encounter (Signed)
Spoke with patient and informed her per Dr.Scott Luking- It is possible that this could be the Lyrica. I think it's reasonable to stop this. I think it's reasonable for patient to follow-up if ongoing troubles. Patient verbalized understanding.

## 2016-06-02 NOTE — Telephone Encounter (Signed)
It is possible that this could be the Lyrica. I think it's reasonable to stop this. I think it's reasonable for patient to follow-up if ongoing troubles

## 2016-06-12 NOTE — Progress Notes (Signed)
Office Visit Note  Patient: Ruth [name]             Date of Birth: 1941-01-28           MRN: [id number]             PCP: Sallee Lange, MD     Referring: Kathyrn Drown, MD Visit Date: 06/15/2016 Occupation: @GUAROCC @    Subjective:  Follow-up   History of Present Illness: Ruth [name] is a 76 y.o. female  Patient was last seen in our office for fibromyalgia and osteoarthritis on 08/17/2013  She presents today for rib pain .  Patient's PCP is Dr. Sallee Lange.  According to Epic notes, patient saw Dr. Bronson Ing  on 05/26/2016 (cardiologist in [place]) for precordial pain after seeing Ms. Ermalinda Barrios, PA  On Apr 15, 2016 initially for precordial chest pain.  On Apr 15, 2016, Pt saw Imogene Burn, PA-C at [facility] after being referred to them from Dr. Lance Sell office. Her complaint was:  Having chest pain while driving her car with her husband on Saturday, Apr 11, 2016.  Her symptoms included left jaw ache which resolved on its own followed by chest pain which radiated to the left arm and into the jaw. She described the chest pain as heaviness with sharp shooting pain. She took her husband's nitroglycerin for the pain which eased the chest pain in 3-5 minutes.  Then she did not have any additional chest pain thereafter.  At [facility]  visit on 05/26/2016 the patient underwent cardiovascular testing for chest pain. On 04/17/2016 she had nuclear stress test which showed no evidence of myocardial ischemia or scar with left ventricular ejection fraction of 78%. At that visit, she also denied symptoms of chest pain, palpitations, shortness of breath, lightheadedness, dizziness, orthopnea, PND and syncope. Patient also had hyperlipidemia and refuse treatment for that. She was told to follow up when necessary.  FMS flaring according to patient. It is "out of control". Patient states "I've had some really bad days" and even get out of bed and  needed assistance from other people to get out of bed". These episodes occurred for about 4 months.  Pt also describing "core pain" --> to chest / thorax area / lower rib pain bilateral.    Activities of Daily Living:  Patient reports morning stiffness for 120 minutes.   Patient Reports nocturnal pain.  Difficulty dressing/grooming: Reports Difficulty climbing stairs: Reports Difficulty getting out of chair: Reports Difficulty using hands for taps, buttons, cutlery, and/or writing: Reports   Review of Systems  Constitutional: Positive for fatigue.  HENT: Negative for mouth sores and mouth dryness.   Eyes: Negative for dryness.  Respiratory: Negative for shortness of breath.   Gastrointestinal: Negative for constipation and diarrhea.  Musculoskeletal: Positive for myalgias and myalgias.  Skin: Negative for sensitivity to sunlight.  Psychiatric/Behavioral: Positive for sleep disturbance. Negative for decreased concentration.    PMFS History:  Patient Active Problem List   Diagnosis Date Noted  . Family history of early CAD 04/15/2016  . Primary osteoarthritis of both knees 12/31/2015  . Chest pain 07/13/2015  . Atypical chest pain 07/13/2015  . Major depression 03/19/2015  . Vulvodynia 08/03/2014  . Functional constipation 08/03/2014  . Anxiety 07/06/2014  . Fibromyalgia 04/25/2014  . Snoring 04/22/2014  . Prediabetes 03/18/2014  . Hyperlipidemia 03/18/2014  . Osteopenia 03/06/2014  . History of cardiac catheterization 04/28/2013  . Dyspepsia 02/02/2012  . GERD (gastroesophageal  reflux disease) 02/02/2012  . Depression 06/18/2011  . Dysphagia 02/05/2011  . CHEST PAIN 02/11/2009  . GASTROESOPHAGEAL REFLUX DISEASE, CHRONIC 02/15/2007  . IRRITABLE BOWEL SYNDROME 02/15/2007  . Degenerative joint disease 02/15/2007  . REFLUX ESOPHAGITIS 02/14/2007  . GASTRITIS 02/14/2007    Past Medical History:  Diagnosis Date  . Anxiety   . Arthritis   . Depression   .  Fibromyalgia   . GERD (gastroesophageal reflux disease)    EGD/ colon 1/09  . H pylori ulcer 1980-1990   s/p treatment  . Headache   . Hyperlipidemia   . IBS (irritable bowel syndrome)   . Neck pain   . OSA (obstructive sleep apnea)   . Refusal of blood transfusions as patient is Jehovah's Witness   . Vertigo     Family History  Problem Relation Age of Onset  . Diabetes Father   . Heart attack Father   . Lung cancer Father   . Cirrhosis Father 63    etoh cirrhosis  . Lung cancer Sister   . Depression Sister   . Alzheimer's disease Sister   . Hypertension Sister   . Diabetes Sister   . Lung cancer Sister   . Paranoid behavior Mother     depression  . Colon polyps Mother    Past Surgical History:  Procedure Laterality Date  . ABDOMINAL HYSTERECTOMY  1970  . APPENDECTOMY    . BILATERAL SALPINGOOPHORECTOMY  1990s  . CARDIAC CATHETERIZATION  2002   [facility]) normal coronary arteries   . COLONOSCOPY  01/2007   Dr. Meriel Flavors  . COLONOSCOPY N/A 04/18/2015   Procedure: COLONOSCOPY;  Surgeon: Rogene Houston, MD;  [facility];  Service: Endoscopy;  Laterality: N/A;  830 - moved to 8:55 - Ann to notify pt  . COLONOSCOPY WITH ESOPHAGOGASTRODUODENOSCOPY (EGD)  02/16/2012   Procedure: COLONOSCOPY WITH ESOPHAGOGASTRODUODENOSCOPY (EGD);  Surgeon: Daneil Dolin, MD;  Location: [facility];  Service: Endoscopy;  Laterality: N/A;  8:45  . ESOPHAGEAL DILATION N/A 06/07/2014   Procedure: ESOPHAGEAL DILATION;  Surgeon: Rogene Houston, MD;  Location: [facility];  Service: Endoscopy;  Laterality: N/A;  . ESOPHAGOGASTRODUODENOSCOPY  01/2007   Dr Sharlett Iles- 3 cm hiatal hernia, benign esophageal biopsies, erosive esophagitis, gastritis  . ESOPHAGOGASTRODUODENOSCOPY N/A 06/07/2014   Procedure: ESOPHAGOGASTRODUODENOSCOPY (EGD);  Surgeon: Rogene Houston, MD;  [facility];  Service: Endoscopy;  Laterality: N/A;  1200  . FOOT SURGERY    . INNER EAR SURGERY     Left    . KNEE SURGERY     Multiple  . SHOULDER SURGERY  2011  . TONSILLECTOMY    . TUBAL LIGATION     Social History   Social History Narrative   Married   2 grown children, 1 adopted son-autistic-lives next door    Objective: Vital Signs: BP 135/61 (BP Location: Left Arm, Patient Position: Sitting, Cuff Size: Normal)   Pulse 72   Resp 15   Ht 5\' 5"  (1.651 m)   Wt 220 lb (99.8 kg)   BMI 36.61 kg/m    Physical Exam  Constitutional: She is oriented to person, place, and time. She appears well-developed and well-nourished.  HENT:  Head: Normocephalic and atraumatic.  Eyes: EOM are normal. Pupils are equal, round, and reactive to light.  Cardiovascular: Normal rate, regular rhythm and normal heart sounds.  Exam reveals no gallop and no friction rub.   No murmur heard. Pulmonary/Chest: Effort normal and breath sounds normal. She  has no wheezes. She has no rales.  Abdominal: Soft. Bowel sounds are normal. She exhibits no distension. There is no tenderness. There is no guarding. No hernia.  Musculoskeletal: Normal range of motion. She exhibits no edema, tenderness or deformity.  Lymphadenopathy:    She has no cervical adenopathy.  Neurological: She is alert and oriented to person, place, and time. Coordination normal.  Skin: Skin is warm and dry. Capillary refill takes less than 2 seconds. No rash noted.  Psychiatric: She has a normal mood and affect. Her behavior is normal.  Nursing note and vitals reviewed.    Musculoskeletal Exam:  Full range of motion of all joints Grip strength is equal and strong bilaterally Fiber myalgia tender points are 18 out of 18 positive  CDAI Exam: No CDAI exam completed.  No synovitis on examination  Investigation: No additional findings.   Imaging: No results found.  Speciality Comments: No specialty comments available.    Procedures:  No procedures performed Allergies: Blood-group specific substance; Ciprofloxacin; Codeine; Vicodin  [hydrocodone-acetaminophen]; and Tramadol   Assessment / Plan:     Visit Diagnoses: Fibromyalgia  Fatigue, unspecified type  Primary insomnia  Precordial pain  Gastroesophageal reflux disease, esophagitis presence not specified  Primary osteoarthritis of both knees   Plan: #1: FMS; active disease with generalized pain. Patient's fibromyalgia is being managed by her PCP. They will continue managing her fibromyalgia but we will start her on daytime muscle relaxer (baclofen) and nighttime muscle relaxer (Zanaflex 4 mg). I've advised the patient how to use these medications. She should use it as needed. She should not use them simultaneously. Zanaflex only for nighttime use. Baclofen is only for daytime use if it does not make her sleepy.   #2: Chest pain patient had a workup done by Estella Husk, PA-C on 04/15/2016; and Dr. Kate Sable  on 05/26/2016.(Both workup were negative. See epic for full details)  #3: Her PCP has prescribed Lyrica 25 mg 2 times a day Hydrocodone 1 by mouth twice a day when necessary moderate pain Nitroglycerin 0.4MG  sublingual tablet when necessary chest pain   #4: I've advised the patient to get the following medications from her PCP but we will start her prescription as follows Baclofen 10 mg; 1 at 7 AM when necessary; 1 at 2 PM when necessary Dispense 90 pills with 2 refills  For nighttime muscle relaxer, will use Zanaflex 4 mg; 1 by mouth daily at bedtime when necessary Dispense 30 pills with 2 refills  As noted above, she'll get refills from PCP.  #5: We had a long discussion on the proper treatment for fibromyalgia including water aerobics if possible. If she cannot do it 3 times a week, she can go once a week. Patient advance her exercise routine as tolerated as discussed in the office.  #6: Return to clinic in 6 months  #7: Note that patient's chest pain does not seem to be of cardiac origin. She's had workup through[facility] as well as another cardiologist. Please see history of present illness for full details on my examination I was able to elicit chest wall pain through palpation. Note that she has not had any chest pain since her original episode in February while she was driving her husband's car  Orders: No orders of the defined types were placed in this encounter.  No orders of the defined types were placed in this encounter.   Face-to-face time spent with patient was 30 minutes. 50% of time was  spent in counseling and coordination of care.  Follow-Up Instructions: Return in about 6 months (around 12/15/2016) for [place], [place].   Eliezer Lofts, PA-C  Note - This record has been created using [employer].  Chart creation errors have been sought, but may not always  have been located. Such creation errors do not reflect on  the standard of medical care.

## 2016-06-15 ENCOUNTER — Ambulatory Visit (INDEPENDENT_AMBULATORY_CARE_PROVIDER_SITE_OTHER): Payer: PPO | Admitting: Rheumatology

## 2016-06-15 ENCOUNTER — Encounter: Payer: Self-pay | Admitting: Rheumatology

## 2016-06-15 VITALS — BP 135/61 | HR 72 | Resp 15 | Ht 65.0 in | Wt 220.0 lb

## 2016-06-15 DIAGNOSIS — R072 Precordial pain: Secondary | ICD-10-CM

## 2016-06-15 DIAGNOSIS — F5101 Primary insomnia: Secondary | ICD-10-CM | POA: Diagnosis not present

## 2016-06-15 DIAGNOSIS — K219 Gastro-esophageal reflux disease without esophagitis: Secondary | ICD-10-CM

## 2016-06-15 DIAGNOSIS — M170 Bilateral primary osteoarthritis of knee: Secondary | ICD-10-CM

## 2016-06-15 DIAGNOSIS — R5383 Other fatigue: Secondary | ICD-10-CM

## 2016-06-15 DIAGNOSIS — M797 Fibromyalgia: Secondary | ICD-10-CM | POA: Diagnosis not present

## 2016-06-23 ENCOUNTER — Other Ambulatory Visit: Payer: Self-pay | Admitting: Rheumatology

## 2016-06-23 NOTE — Telephone Encounter (Signed)
Patient states she was seen last week by Mr. Carlyon Shadow and she was under the impression that two different muscle relaxer prescriptions were being sent into the pharmacy for her. Patient states the pharmacy does not have them. She uses [facility] in [place].

## 2016-06-24 MED ORDER — TIZANIDINE HCL 4 MG PO TABS: 4.0000 mg | ORAL_TABLET | Freq: Every evening | ORAL | 2 refills | Status: DC | PRN

## 2016-06-24 MED ORDER — BACLOFEN 10 MG PO TABS: ORAL_TABLET | ORAL | 2 refills | Status: DC

## 2016-06-24 NOTE — Telephone Encounter (Signed)
According to your last office note the plan was to send these prescriptions to the pharmacy for the patient. But it doesn't look as if they were sent.   Baclofen 10 mg; 1 at 7 AM when necessary; 1 at 2 PM when necessary Dispense 90 pills with 2 refills  For nighttime muscle relaxer, will use Zanaflex 4 mg; 1 by mouth daily at bedtime when necessary Dispense 30 pills with 2 refills  Okay to to send the Baclofen and Zanaflex?

## 2016-06-26 ENCOUNTER — Ambulatory Visit (INDEPENDENT_AMBULATORY_CARE_PROVIDER_SITE_OTHER): Payer: PPO | Admitting: Family Medicine

## 2016-06-26 ENCOUNTER — Encounter: Payer: Self-pay | Admitting: Family Medicine

## 2016-06-26 VITALS — BP 128/76 | Ht 65.0 in | Wt 216.0 lb

## 2016-06-26 DIAGNOSIS — J385 Laryngeal spasm: Secondary | ICD-10-CM | POA: Diagnosis not present

## 2016-06-26 MED ORDER — RANITIDINE HCL 150 MG PO TABS: 150.0000 mg | ORAL_TABLET | Freq: Two times a day (BID) | ORAL | 5 refills | Status: DC

## 2016-06-26 MED ORDER — DIAZEPAM 5 MG PO TABS: ORAL_TABLET | ORAL | 1 refills | Status: DC

## 2016-06-26 NOTE — Progress Notes (Signed)
   Subjective:    [name], female    DOB: 08/28/1940, 76 y.o.   MRN: [id number]  HPI Patient in today for choking episode that occurred 2 days ago. Patient states onset of episodes was about 40 years ago off and on.   Stated during episodes she is unable to speak swallow or breath. Duration is 2-3 minutes. Has tried anxiety medications in the past.   Started in the 1970s Typical spell- can be mid sentence or even in her speep Cant breathe or talk Can last for 3 to 4 minutes Can cause chest pain Goes into a coughing spell which will last sebereal minutes Does not feel anxious or stressed currently sometines food trifggers it   Review of Systems Denies any difficulty swallowing denies fever chills sweats denies wheezing denies chest pain cough denies reflux no abdominal pain no swelling    Objective:   Physical Exam Neck no masses throat appears normal years are normal lungs are clear hearts regular pulse normal extremities no edema  Patient Arty had EGD with stretching in the past few years she denies any dysphagia     Assessment & Plan:  Laryngeal spasm-ration is to cut back on caffeine's also avoid eating large portions eat slowly if further trouble referral to ENT  She does get stressed at times and anxious at times and uses Valium intermittently only at home caution drowsiness  She will follow-up if not having improvement in her symptoms

## 2016-08-20 ENCOUNTER — Encounter: Payer: Self-pay | Admitting: Family Medicine

## 2016-08-20 ENCOUNTER — Ambulatory Visit (INDEPENDENT_AMBULATORY_CARE_PROVIDER_SITE_OTHER): Payer: PPO | Admitting: Family Medicine

## 2016-08-20 VITALS — BP 124/66 | Ht 65.0 in | Wt 216.5 lb

## 2016-08-20 DIAGNOSIS — Z780 Asymptomatic menopausal state: Secondary | ICD-10-CM | POA: Diagnosis not present

## 2016-08-20 DIAGNOSIS — R319 Hematuria, unspecified: Secondary | ICD-10-CM | POA: Diagnosis not present

## 2016-08-20 DIAGNOSIS — E7849 Other hyperlipidemia: Secondary | ICD-10-CM

## 2016-08-20 DIAGNOSIS — K760 Fatty (change of) liver, not elsewhere classified: Secondary | ICD-10-CM

## 2016-08-20 DIAGNOSIS — E784 Other hyperlipidemia: Secondary | ICD-10-CM | POA: Diagnosis not present

## 2016-08-20 DIAGNOSIS — R7303 Prediabetes: Secondary | ICD-10-CM | POA: Diagnosis not present

## 2016-08-20 LAB — POCT URINALYSIS DIPSTICK
Spec Grav, UA: 1.02 (ref 1.010–1.025)
pH, UA: 6 (ref 5.0–8.0)

## 2016-08-20 MED ORDER — DULOXETINE HCL 20 MG PO CPEP: 20.0000 mg | ORAL_CAPSULE | Freq: Every day | ORAL | 3 refills | Status: DC

## 2016-08-20 NOTE — Progress Notes (Signed)
   Subjective:    [name], female    DOB: Apr 05, 1940, 76 y.o.   MRN: [id number]  HPI  Patient in today for a follow up on Larynospasm. Patient states still having spasms periodically.   Patient also has concerns of headache when leaning forward. States when she bends over she gets a little bit of a headache she denies any other headaches at other times denies any throbbing nausea or double vision  Patient also has concerns of fibromyalgia. States that she has been off of Prozac. States she has been depressed and felt suicidal at times. She stopped taking her Prozac she states she's felt depressed. When I specifically asked her regarding suicidal she states she's not suicidal but she does states at times she feels depressed to the point where she felt like she wished she wasn't living she has no specific plan to harm herself  25 minutes was spent with the patient. Greater than half the time was spent in discussion and answering questions and counseling regarding the issues that the patient came in for today.   Review of Systems Patient relates what sounded like hematuria but denies any other particular troubles currently denies chest tightness pressure pain shortness breath nausea vomiting diarrhea    Objective:   Physical Exam Lungs clear heart regular abdomen soft extremities no edema       Assessment & Plan:  Laryngeal spasm resolving  Depression-start Cymbalta-patient does not one see a psychiatrist she agrees that she is not suicidal and if she starts feeling that way she will immediately seek help here or ER in addition to this she will follow-up with [place] in several weeks' time she agrees to start medication.  Patient currently not suicidal  Patient does not one counseling  Fibromyalgia Cymbalta as directed  Intermittent abdominal pain sounds to be more rib cage but patient thinks she saw blood in her urine we will check UA more than likely will need referral  urology for cystoscope  Urine negative but because of potential hematuria I believe the patient does need appointment with urology for cystoscope

## 2016-08-20 NOTE — Progress Notes (Signed)
Referral ordered in EPIC. 

## 2016-08-25 DIAGNOSIS — K760 Fatty (change of) liver, not elsewhere classified: Secondary | ICD-10-CM | POA: Diagnosis not present

## 2016-08-25 DIAGNOSIS — E784 Other hyperlipidemia: Secondary | ICD-10-CM | POA: Diagnosis not present

## 2016-08-25 DIAGNOSIS — R7303 Prediabetes: Secondary | ICD-10-CM | POA: Diagnosis not present

## 2016-08-26 LAB — LIPID PANEL
Chol/HDL Ratio: 5.3 ratio — ABNORMAL HIGH (ref 0.0–4.4)
Cholesterol, Total: 281 mg/dL — ABNORMAL HIGH (ref 100–199)
HDL: 53 mg/dL (ref >39)
LDL Calculated: 202 mg/dL — ABNORMAL HIGH (ref 0–99)
Triglycerides: 132 mg/dL (ref 0–149)
VLDL Cholesterol Cal: 26 mg/dL (ref 5–40)

## 2016-08-26 LAB — HEMOGLOBIN A1C
Est. average glucose Bld gHb Est-mCnc: 123 mg/dL
Hgb A1c MFr Bld: 5.9 % — ABNORMAL HIGH (ref 4.8–5.6)

## 2016-08-26 LAB — HEPATIC FUNCTION PANEL
ALT: 16 IU/L (ref 0–32)
AST: 18 IU/L (ref 0–40)
Albumin: 4.6 g/dL (ref 3.5–4.8)
Alkaline Phosphatase: 72 IU/L (ref 39–117)
Bilirubin Total: 0.3 mg/dL (ref 0.0–1.2)
Bilirubin, Direct: 0.08 mg/dL (ref 0.00–0.40)
Total Protein: 6.8 g/dL (ref 6.0–8.5)

## 2016-08-26 LAB — CBC WITH DIFFERENTIAL/PLATELET
Basophils Absolute: 0 10*3/uL (ref 0.0–0.2)
Basos: 1 %
EOS (ABSOLUTE): 0.1 10*3/uL (ref 0.0–0.4)
Eos: 3 %
Hematocrit: 38.2 % (ref 34.0–46.6)
Hemoglobin: 12.8 g/dL (ref 11.1–15.9)
Immature Grans (Abs): 0 10*3/uL (ref 0.0–0.1)
Immature Granulocytes: 0 %
Lymphocytes Absolute: 1.6 10*3/uL (ref 0.7–3.1)
Lymphs: 37 %
MCH: 30.8 pg (ref 26.6–33.0)
MCHC: 33.5 g/dL (ref 31.5–35.7)
MCV: 92 fL (ref 79–97)
Monocytes Absolute: 0.4 10*3/uL (ref 0.1–0.9)
Monocytes: 8 %
Neutrophils Absolute: 2.1 10*3/uL (ref 1.4–7.0)
Neutrophils: 51 %
Platelets: 257 10*3/uL (ref 150–379)
RBC: 4.16 x10E6/uL (ref 3.77–5.28)
RDW: 14 % (ref 12.3–15.4)
WBC: 4.3 10*3/uL (ref 3.4–10.8)

## 2016-08-28 ENCOUNTER — Other Ambulatory Visit (HOSPITAL_COMMUNITY): Payer: Self-pay

## 2016-08-31 ENCOUNTER — Encounter: Payer: Self-pay | Admitting: Family Medicine

## 2016-09-01 ENCOUNTER — Ambulatory Visit (HOSPITAL_COMMUNITY)
Admission: RE | Admit: 2016-09-01 | Discharge: 2016-09-01 | Disposition: A | Discharge status: Home / Self Care | Payer: PPO | Source: Ambulatory Visit | Attending: Family Medicine | Admitting: Family Medicine

## 2016-09-01 DIAGNOSIS — M8589 Other specified disorders of bone density and structure, multiple sites: Secondary | ICD-10-CM | POA: Diagnosis not present

## 2016-09-01 DIAGNOSIS — M8588 Other specified disorders of bone density and structure, other site: Secondary | ICD-10-CM | POA: Diagnosis not present

## 2016-09-01 DIAGNOSIS — Z780 Asymptomatic menopausal state: Secondary | ICD-10-CM | POA: Diagnosis not present

## 2016-09-10 ENCOUNTER — Ambulatory Visit: Payer: PPO | Admitting: Family Medicine

## 2016-09-14 ENCOUNTER — Encounter: Payer: PPO | Admitting: Family Medicine

## 2016-09-18 ENCOUNTER — Encounter: Payer: Self-pay | Admitting: Family Medicine

## 2016-09-18 ENCOUNTER — Ambulatory Visit (INDEPENDENT_AMBULATORY_CARE_PROVIDER_SITE_OTHER): Payer: PPO | Admitting: Family Medicine

## 2016-09-18 VITALS — BP 118/74 | Temp 98.8°F | Ht 65.0 in | Wt 217.0 lb

## 2016-09-18 DIAGNOSIS — R1032 Left lower quadrant pain: Secondary | ICD-10-CM

## 2016-09-18 MED ORDER — ONDANSETRON 8 MG PO TBDP: 8.0000 mg | ORAL_TABLET | Freq: Three times a day (TID) | ORAL | 0 refills | Status: DC | PRN

## 2016-09-18 MED ORDER — TRIAMCINOLONE ACETONIDE 0.1 % EX CREA: 1.0000 "application " | TOPICAL_CREAM | Freq: Two times a day (BID) | CUTANEOUS | 0 refills | Status: DC

## 2016-09-18 NOTE — Progress Notes (Signed)
   Subjective:    [name], female    DOB: [birth year], 76 y.o.   MRN: [id number]  Depression         This is a chronic problem.  Associated symptoms include no fatigue and no headaches. pt stopped taking cymbalta bc it was causing headaches. She started back on prozac 20mg .  She states she does better on the Prozac she has plenty at home bp has been higher than her normal. She reads off several readings from her home reader some very normal some slightly elevated  Painful area on left side. Worse with movement.  She relates intermittent lower abdominal pain with movement denies fever chills sweats denies rectal bleeding had colonoscopy last year   Review of Systems  Constitutional: Negative for activity change, fatigue and fever.  Respiratory: Negative for cough and shortness of breath.   Cardiovascular: Negative for chest pain and leg swelling.  Neurological: Negative for headaches.  Psychiatric/Behavioral: Positive for depression.       Objective:   Physical Exam  Constitutional: She appears well-nourished. No distress.  Cardiovascular: Normal rate, regular rhythm and normal heart sounds.   No murmur heard. Pulmonary/Chest: Effort normal and breath sounds normal. No respiratory distress.  Musculoskeletal: She exhibits no edema.  Lymphadenopathy:    She has no cervical adenopathy.  Neurological: She is alert. She exhibits normal muscle tone.  Psychiatric: Her behavior is normal.  Vitals reviewed.  Abdomen is soft no guarding rebound or tenderness  Patient was instructed she starts having increased lower abdominal pain discomfort she isn't notify us, also if not getting better over the next 4 weeks to notify us She is not tender I do not feel this is diverticulitis    Assessment & Plan:  Depression she will uses Prozac she will let us know when she needs refills she is going to try 40 mg daily  Blood pressure very normal on exam today  Lower abdominal  discomfort-no masses felt if ongoing troubles may need testing hold off on any testing currently recent labs colonoscopy reviewed

## 2016-09-23 ENCOUNTER — Encounter: Payer: Self-pay | Admitting: Family Medicine

## 2016-09-25 DIAGNOSIS — R310 Gross hematuria: Secondary | ICD-10-CM | POA: Diagnosis not present

## 2016-10-07 DIAGNOSIS — R310 Gross hematuria: Secondary | ICD-10-CM | POA: Diagnosis not present

## 2016-10-09 DIAGNOSIS — G8929 Other chronic pain: Secondary | ICD-10-CM | POA: Diagnosis not present

## 2016-10-09 DIAGNOSIS — M25561 Pain in right knee: Secondary | ICD-10-CM | POA: Diagnosis not present

## 2016-10-09 DIAGNOSIS — M25562 Pain in left knee: Secondary | ICD-10-CM | POA: Diagnosis not present

## 2016-10-15 DIAGNOSIS — N200 Calculus of kidney: Secondary | ICD-10-CM | POA: Diagnosis not present

## 2016-10-15 DIAGNOSIS — R310 Gross hematuria: Secondary | ICD-10-CM | POA: Diagnosis not present

## 2016-11-04 ENCOUNTER — Encounter: Payer: Self-pay | Admitting: Rheumatology

## 2016-11-04 ENCOUNTER — Ambulatory Visit (INDEPENDENT_AMBULATORY_CARE_PROVIDER_SITE_OTHER): Payer: PPO | Admitting: *Deleted

## 2016-11-04 VITALS — BP 137/69 | HR 86 | Ht 65.0 in | Wt 209.0 lb

## 2016-11-04 DIAGNOSIS — M7061 Trochanteric bursitis, right hip: Secondary | ICD-10-CM

## 2016-11-04 DIAGNOSIS — M461 Sacroiliitis, not elsewhere classified: Secondary | ICD-10-CM

## 2016-11-04 DIAGNOSIS — M7062 Trochanteric bursitis, left hip: Secondary | ICD-10-CM

## 2016-11-04 DIAGNOSIS — F5101 Primary insomnia: Secondary | ICD-10-CM | POA: Diagnosis not present

## 2016-11-04 DIAGNOSIS — R5383 Other fatigue: Secondary | ICD-10-CM | POA: Diagnosis not present

## 2016-11-04 DIAGNOSIS — M170 Bilateral primary osteoarthritis of knee: Secondary | ICD-10-CM | POA: Diagnosis not present

## 2016-11-04 DIAGNOSIS — M797 Fibromyalgia: Secondary | ICD-10-CM | POA: Diagnosis not present

## 2016-11-04 DIAGNOSIS — M62838 Other muscle spasm: Secondary | ICD-10-CM | POA: Diagnosis not present

## 2016-11-04 MED ORDER — BACLOFEN 10 MG PO TABS: ORAL_TABLET | ORAL | 1 refills | Status: DC

## 2016-11-04 MED ORDER — TIZANIDINE HCL 4 MG PO TABS: 4.0000 mg | ORAL_TABLET | Freq: Every day | ORAL | 1 refills | Status: AC

## 2016-11-04 NOTE — Progress Notes (Signed)
Office Visit Note  Patient: Ruth [name]             Date of Birth: 30-Sep-1940           MRN: [id number]             PCP: Kathyrn Drown, MD Referring: Kathyrn Drown, MD Visit Date: 11/04/2016 Occupation: @GUAROCC @    Subjective:  No chief complaint on file.   History of Present Illness: Ruth [name] is a 76 y.o. female  Was last seen in our office on 06/15/2016 for fibromyalgia and osteoarthritis. On that visit in April, she reported that her fibromyalgia was "out of control". She also reported that she had really bad days that it was hard for her to get out of bed.  Today, having muscle pain/cramps to various areas of body including abdominal muscles just under bra line. Also getting cortisone injection to knees for OA to both knees.  Not using muscle relaxer until pain is significant.  Pt states she cannot sleep in her bed b/c of pain to her body as the mattress touches the skin.  Activities of Daily Living:  Patient reports morning stiffness for 30 minute.   Patient Reports nocturnal pain.  Difficulty dressing/grooming: Reports Difficulty climbing stairs: Reports Difficulty getting out of chair: Reports Difficulty using hands for taps, buttons, cutlery, and/or writing: Reports   Review of Systems  Constitutional: Positive for fatigue.  HENT: Negative for mouth sores and mouth dryness.   Eyes: Negative for dryness.  Respiratory: Negative for shortness of breath.   Gastrointestinal: Negative for constipation and diarrhea.  Musculoskeletal: Positive for myalgias and myalgias.  Skin: Negative for sensitivity to sunlight.  Psychiatric/Behavioral: Positive for sleep disturbance. Negative for decreased concentration.    PMFS History:  Patient Active Problem List   Diagnosis Date Noted  . Family history of early CAD 04/15/2016  . Primary osteoarthritis of both knees 12/31/2015  . Chest pain 07/13/2015  . Atypical chest pain 07/13/2015  . Major depression  03/19/2015  . Vulvodynia 08/03/2014  . Functional constipation 08/03/2014  . Anxiety 07/06/2014  . Fibromyalgia 04/25/2014  . Snoring 04/22/2014  . Prediabetes 03/18/2014  . Hyperlipidemia 03/18/2014  . Osteopenia 03/06/2014  . History of cardiac catheterization 04/28/2013  . Dyspepsia 02/02/2012  . GERD (gastroesophageal reflux disease) 02/02/2012  . Depression 06/18/2011  . Dysphagia 02/05/2011  . CHEST PAIN 02/11/2009  . GASTROESOPHAGEAL REFLUX DISEASE, CHRONIC 02/15/2007  . IRRITABLE BOWEL SYNDROME 02/15/2007  . Degenerative joint disease 02/15/2007  . REFLUX ESOPHAGITIS 02/14/2007  . GASTRITIS 02/14/2007    Past Medical History:  Diagnosis Date  . Anxiety   . Arthritis   . Depression   . Fibromyalgia   . GERD (gastroesophageal reflux disease)    EGD/ colon 1/09  . H pylori ulcer 1980-1990   s/p treatment  . Headache   . Hyperlipidemia   . IBS (irritable bowel syndrome)   . Neck pain   . OSA (obstructive sleep apnea)   . Refusal of blood transfusions as patient is Jehovah's Witness   . Vertigo     Family History  Problem Relation Age of Onset  . Diabetes Father   . Heart attack Father   . Lung cancer Father   . Cirrhosis Father 43       etoh cirrhosis  . Lung cancer Sister   . Depression Sister   . Alzheimer's disease Sister   . Hypertension Sister   . Diabetes Sister   .  Lung cancer Sister   . Paranoid behavior Mother        depression  . Colon polyps Mother    Past Surgical History:  Procedure Laterality Date  . ABDOMINAL HYSTERECTOMY  1970  . APPENDECTOMY    . BILATERAL SALPINGOOPHORECTOMY  1990s  . CARDIAC CATHETERIZATION  2002   [facility]) normal coronary arteries   . COLONOSCOPY  01/2007   Dr. Meriel Flavors  . COLONOSCOPY N/A 04/18/2015   Procedure: COLONOSCOPY;  Surgeon: Rogene Houston, MD;  Location: [facility];  Service: Endoscopy;  Laterality: N/A;  830 - moved to 8:55 - Ann to notify pt  . COLONOSCOPY WITH ESOPHAGOGASTRODUODENOSCOPY  (EGD)  02/16/2012   Procedure: COLONOSCOPY WITH ESOPHAGOGASTRODUODENOSCOPY (EGD);  Surgeon: Daneil Dolin, MD;  Location: [facility];  Service: Endoscopy;  Laterality: N/A;  8:45  . ESOPHAGEAL DILATION N/A 06/07/2014   Procedure: ESOPHAGEAL DILATION;  Surgeon: Rogene Houston, MD;  Location: [facility];  Service: Endoscopy;  Laterality: N/A;  . ESOPHAGOGASTRODUODENOSCOPY  01/2007   Dr Sharlett Iles- 3 cm hiatal hernia, benign esophageal biopsies, erosive esophagitis, gastritis  . ESOPHAGOGASTRODUODENOSCOPY N/A 06/07/2014   Procedure: ESOPHAGOGASTRODUODENOSCOPY (EGD);  Surgeon: Rogene Houston, MD;  Location: [facility];  Service: Endoscopy;  Laterality: N/A;  1200  . FOOT SURGERY    . INNER EAR SURGERY     Left  . KNEE SURGERY     Multiple  . SHOULDER SURGERY  2011  . TONSILLECTOMY    . TUBAL LIGATION     Social History   Social History Narrative   Married   2 grown children, 1 adopted son-autistic-lives next door     Objective: Vital Signs: BP 137/69 (BP Location: Left Arm, Patient Position: Sitting, Cuff Size: Normal)   Pulse 86   Ht 5\' 5"  (1.651 m)   Wt 209 lb (94.8 kg)   BMI 34.78 kg/m    Physical Exam  Constitutional: She is oriented to person, place, and time. She appears well-developed and well-nourished.  HENT:  Head: Normocephalic and atraumatic.  Eyes: Pupils are equal, round, and reactive to light. EOM are normal.  Cardiovascular: Normal rate, regular rhythm and normal heart sounds.  Exam reveals no gallop and no friction rub.   No murmur heard. Pulmonary/Chest: Effort normal and breath sounds normal. She has no wheezes. She has no rales.  Abdominal: Soft. Bowel sounds are normal. She exhibits no distension. There is no tenderness. There is no guarding. No hernia.  Musculoskeletal: Normal range of motion. She exhibits no edema, tenderness or deformity.  Lymphadenopathy:    She has no cervical adenopathy.  Neurological: She is alert and oriented to person,  place, and time. Coordination normal.  Skin: Skin is warm and dry. Capillary refill takes less than 2 seconds. No rash noted.  Psychiatric: She has a normal mood and affect. Her behavior is normal.  Nursing note and vitals reviewed.    Musculoskeletal Exam:  Full range of motion of all joints Grip strength is equal and strong bilaterally Fiber myalgia tender points are 18 out of 18 positive  CDAI Exam: No CDAI exam completed.  No synovitis on examination  Investigation: No additional findings. Office Visit on 08/20/2016  Component Date Value Ref Range Status  . WBC 08/25/2016 4.3  3.4 - 10.8 x10E3/uL Final  . RBC 08/25/2016 4.16  3.77 - 5.28 x10E6/uL Final  . Hemoglobin 08/25/2016 12.8  11.1 - 15.9 g/dL Final  . Hematocrit 08/25/2016 38.2  34.0 - 46.6 %  Final  . MCV 08/25/2016 92  79 - 97 fL Final  . MCH 08/25/2016 30.8  26.6 - 33.0 pg Final  . MCHC 08/25/2016 33.5  31.5 - 35.7 g/dL Final  . RDW 08/25/2016 14.0  12.3 - 15.4 % Final  . Platelets 08/25/2016 257  150 - 379 x10E3/uL Final  . Neutrophils 08/25/2016 51  Not Estab. % Final  . Lymphs 08/25/2016 37  Not Estab. % Final  . Monocytes 08/25/2016 8  Not Estab. % Final  . Eos 08/25/2016 3  Not Estab. % Final  . Basos 08/25/2016 1  Not Estab. % Final  . Neutrophils Absolute 08/25/2016 2.1  1.4 - 7.0 x10E3/uL Final  . Lymphocytes Absolute 08/25/2016 1.6  0.7 - 3.1 x10E3/uL Final  . Monocytes Absolute 08/25/2016 0.4  0.1 - 0.9 x10E3/uL Final  . EOS (ABSOLUTE) 08/25/2016 0.1  0.0 - 0.4 x10E3/uL Final  . Basophils Absolute 08/25/2016 0.0  0.0 - 0.2 x10E3/uL Final  . Immature Granulocytes 08/25/2016 0  Not Estab. % Final  . Immature Grans (Abs) 08/25/2016 0.0  0.0 - 0.1 x10E3/uL Final  . Total Protein 08/25/2016 6.8  6.0 - 8.5 g/dL Final  . Albumin 08/25/2016 4.6  3.5 - 4.8 g/dL Final  . Bilirubin Total 08/25/2016 0.3  0.0 - 1.2 mg/dL Final  . Bilirubin, Direct 08/25/2016 0.08  0.00 - 0.40 mg/dL Final  . Alkaline Phosphatase  08/25/2016 72  39 - 117 IU/L Final  . AST 08/25/2016 18  0 - 40 IU/L Final  . ALT 08/25/2016 16  0 - 32 IU/L Final  . Spec Grav, UA 08/20/2016 1.020  1.010 - 1.025 Final  . pH, UA 08/20/2016 6.0  5.0 - 8.0 Final  . Hgb A1c MFr Bld 08/25/2016 5.9* 4.8 - 5.6 % Final   Comment:          Pre-diabetes: 5.7 - 6.4          Diabetes: >6.4          Glycemic control for adults with diabetes: <7.0   . Est. average glucose Bld gHb Est-m* 08/25/2016 123  mg/dL Final  . Cholesterol, Total 08/25/2016 281* 100 - 199 mg/dL Final  . Triglycerides 08/25/2016 132  0 - 149 mg/dL Final  . HDL 08/25/2016 53  >39 mg/dL Final  . VLDL Cholesterol Cal 08/25/2016 26  5 - 40 mg/dL Final  . LDL Calculated 08/25/2016 202* 0 - 99 mg/dL Final  . Chol/HDL Ratio 08/25/2016 5.3* 0.0 - 4.4 ratio Final   Comment:                                   T. Chol/HDL Ratio                                             Men  Women                               1/2 Avg.Risk  3.4    3.3                                   Avg.Risk  5.0    4.4  2X Avg.Risk  9.6    7.1                                3X Avg.Risk 23.4   11.0   Office Visit on 05/19/2016  Component Date Value Ref Range Status  . Bilirubin, UA 05/19/2016 +   Final  . Spec Grav, UA 05/19/2016 1.015  1.030 - 1.035 Final  . Blood, UA 05/19/2016 50   Final  . pH, UA 05/19/2016 5.0  5.0 - 8.0 Final  . Urobilinogen, UA 05/19/2016 2.0* Negative - 2.0 Final     Imaging: No results found.  Speciality Comments: No specialty comments available.    Procedures:  No procedures performed Allergies: Blood-group specific substance; Ciprofloxacin; Codeine; Vicodin [hydrocodone-acetaminophen]; and Tramadol   Assessment / Plan:     Visit Diagnoses: Fibromyalgia  Fatigue, unspecified type  Primary insomnia  Primary osteoarthritis of both knees - 11/04/2016: ==> Sitting range for orthopedics; received cortisone injection to her knee a few weeks ago.;  Contemplating knee replacement   Plan: #1: Fiber myalgia syndrome. Patient rates her pain as 9-10 on a scale of 0-10. Unable to walk, exercise due to her pain. We discussed strategies to minimize her pain including water aerobics for fibromyalgia.  #2: Fatigue and insomnia. Patient has very poor sleep secondary to pain. We offered her cortisone injection for bilateral SI joint and bilateral trapezius muscles but patient declines (fear of needles). That if her pain continues, she'll make an appointment for injection. She also fears that she had a cortisone injection recently to one of her knees and she doesn't want to get too much cortisone in her body so close together.  #3: Bilateral trapezius muscle spasms. Injection declined  #4: Bilateral greater trochanteric bursa pain. Injection declined  #5: Bilateral SI joint pain Injection declined  #6: Bilateral knee OA. Patient recently got an injection from [facility]. She is contemplating getting total knee replacement in the future.  #7: Return to clinic in 6 months  Orders: No orders of the defined types were placed in this encounter.  Meds ordered this encounter  Medications  . tiZANidine (ZANAFLEX) 4 MG tablet    Sig: Take 1 tablet (4 mg total) by mouth at bedtime.    Dispense:  90 tablet    Refill:  1    Order Specific Question:   Supervising Provider    Answer:   Bo Merino [[id number]]  . baclofen (LIORESAL) 10 MG tablet    Sig: 1 tablet at 7 am when necessary, 1 tablet at 2 pm when necessary    Dispense:  180 each    Refill:  1    Order Specific Question:   Supervising Provider    Answer:   Lyda Perone    Face-to-face time spent with patient was 40 minutes. 50% of time was spent in counseling and coordination of care.  Follow-Up Instructions: Return in about 6 months (around 05/04/2017) for FMS, FATIGUE,INSOMNIA,.   Eliezer Lofts, PA-C  Note - This record has been created using  [employer].  Chart creation errors have been sought, but may not always  have been located. Such creation errors do not reflect on  the standard of medical care.

## 2016-12-03 ENCOUNTER — Ambulatory Visit: Payer: PPO | Admitting: Rheumatology

## 2016-12-18 ENCOUNTER — Ambulatory Visit: Payer: PPO | Admitting: Family Medicine

## 2016-12-21 ENCOUNTER — Telehealth: Payer: Self-pay | Admitting: Family Medicine

## 2016-12-21 NOTE — Telephone Encounter (Signed)
Patient is wanting to know if she is due for an endoscopy and colonoscopy.  Also, she got her first shingles shot at the pharmacy.  She wants to know if she needs to get her 2nd shot and if she can get it here?  Also, will insurance pay for it?

## 2016-12-22 NOTE — Telephone Encounter (Signed)
1.  We do not do shingles vaccine.  #2 according to the recent record her next colonoscopy will be due in 2022 #3 according to the GI note from 2016 they did not recommend a follow-up EGD unless the patient is having significant heartburn or significant dysphagia/food getting stuck if she is having any these problems I would recommend a regular scheduled office visit then we can we refer her back to gastroenterology/Dr.Rehman-thank you

## 2016-12-23 NOTE — Telephone Encounter (Signed)
I called and left a vm to r/c. 

## 2016-12-24 ENCOUNTER — Encounter: Payer: Self-pay | Admitting: Family Medicine

## 2016-12-24 ENCOUNTER — Ambulatory Visit: Payer: PPO | Admitting: Family Medicine

## 2016-12-24 VITALS — BP 126/82 | Ht 65.0 in | Wt 209.0 lb

## 2016-12-24 DIAGNOSIS — R270 Ataxia, unspecified: Secondary | ICD-10-CM | POA: Diagnosis not present

## 2016-12-24 DIAGNOSIS — R5383 Other fatigue: Secondary | ICD-10-CM | POA: Diagnosis not present

## 2016-12-24 MED ORDER — RANITIDINE HCL 150 MG PO TABS: 150.0000 mg | ORAL_TABLET | Freq: Two times a day (BID) | ORAL | 1 refills | Status: DC

## 2016-12-24 NOTE — Progress Notes (Signed)
   Subjective:    [name], female    DOB: Jun 25, 1940, 76 y.o.   MRN: [id number]  HPI  Patient arrives with c/o dizziness and staggering around for a week patient states that she has been feeling woozy somewhat lightheaded intermittently also ataxia was walking into walls has difficult time ambulating is feeling very unsteady.  Denies nausea vomiting denies headaches wheezing difficulty breathing PMH benign Patient relates compliance with her medicines Review of Systems Denies headaches denies vomiting diarrhea fever chills sweats does relate some wooziness and dizziness more so when she stands but sometimes when she sitting    Objective:   Physical Exam  No acute findings neurologically he moves both arms and legs well Romberg negative she walks fairly well with some mild ataxia lungs clear heart regular skin warm dry blood pressure good      Assessment & Plan:  Significant fatigue Mild ataxia Lab work indicated Await the results May need MRI of the brain if ongoing symptoms If progressive troubles or worse will need follow-up

## 2016-12-28 DIAGNOSIS — M2042 Other hammer toe(s) (acquired), left foot: Secondary | ICD-10-CM | POA: Diagnosis not present

## 2016-12-28 DIAGNOSIS — M79672 Pain in left foot: Secondary | ICD-10-CM | POA: Diagnosis not present

## 2016-12-28 DIAGNOSIS — G8929 Other chronic pain: Secondary | ICD-10-CM | POA: Diagnosis not present

## 2016-12-30 ENCOUNTER — Emergency Department (HOSPITAL_COMMUNITY): Payer: PPO

## 2016-12-30 ENCOUNTER — Observation Stay (HOSPITAL_COMMUNITY)
Admission: EM | Admit: 2016-12-30 | Discharge: 2016-12-31 | Disposition: A | Discharge status: Home / Self Care | Payer: PPO | Attending: Internal Medicine | Admitting: Internal Medicine

## 2016-12-30 ENCOUNTER — Encounter (HOSPITAL_COMMUNITY): Payer: Self-pay | Admitting: Emergency Medicine

## 2016-12-30 ENCOUNTER — Other Ambulatory Visit: Payer: Self-pay

## 2016-12-30 ENCOUNTER — Observation Stay (HOSPITAL_BASED_OUTPATIENT_CLINIC_OR_DEPARTMENT_OTHER): Payer: PPO

## 2016-12-30 DIAGNOSIS — E785 Hyperlipidemia, unspecified: Secondary | ICD-10-CM | POA: Diagnosis present

## 2016-12-30 DIAGNOSIS — K2100 Gastro-esophageal reflux disease with esophagitis, without bleeding: Secondary | ICD-10-CM | POA: Diagnosis present

## 2016-12-30 DIAGNOSIS — M797 Fibromyalgia: Secondary | ICD-10-CM | POA: Diagnosis present

## 2016-12-30 DIAGNOSIS — R072 Precordial pain: Secondary | ICD-10-CM

## 2016-12-30 DIAGNOSIS — R0789 Other chest pain: Principal | ICD-10-CM | POA: Insufficient documentation

## 2016-12-30 DIAGNOSIS — Z79899 Other long term (current) drug therapy: Secondary | ICD-10-CM | POA: Insufficient documentation

## 2016-12-30 DIAGNOSIS — K219 Gastro-esophageal reflux disease without esophagitis: Secondary | ICD-10-CM | POA: Diagnosis present

## 2016-12-30 DIAGNOSIS — R079 Chest pain, unspecified: Secondary | ICD-10-CM | POA: Diagnosis present

## 2016-12-30 DIAGNOSIS — E7801 Familial hypercholesterolemia: Secondary | ICD-10-CM | POA: Diagnosis not present

## 2016-12-30 LAB — CREATININE, SERUM
Creatinine, Ser: 0.67 mg/dL (ref 0.44–1.00)
GFR calc Af Amer: >60 mL/min (ref >60)
GFR calc non Af Amer: >60 mL/min (ref >60)

## 2016-12-30 LAB — CBC
HCT: 38.1 % (ref 36.0–46.0)
HCT: 38.2 % (ref 36.0–46.0)
Hemoglobin: 12.7 g/dL (ref 12.0–15.0)
Hemoglobin: 12.5 g/dL (ref 12.0–15.0)
MCH: 31.8 pg (ref 26.0–34.0)
MCH: 31.6 pg (ref 26.0–34.0)
MCHC: 33.3 g/dL (ref 30.0–36.0)
MCHC: 32.7 g/dL (ref 30.0–36.0)
MCV: 95.5 fL (ref 78.0–100.0)
MCV: 96.5 fL (ref 78.0–100.0)
Platelets: 223 10*3/uL (ref 150–400)
Platelets: 245 10*3/uL (ref 150–400)
RBC: 3.99 MIL/uL (ref 3.87–5.11)
RBC: 3.96 MIL/uL (ref 3.87–5.11)
RDW: 13.6 % (ref 11.5–15.5)
RDW: 13.6 % (ref 11.5–15.5)
WBC: 5.1 10*3/uL (ref 4.0–10.5)
WBC: 7.3 10*3/uL (ref 4.0–10.5)

## 2016-12-30 LAB — BASIC METABOLIC PANEL
Anion gap: 9 (ref 5–15)
BUN: 19 mg/dL (ref 6–20)
CO2: 24 mmol/L (ref 22–32)
Calcium: 9.1 mg/dL (ref 8.9–10.3)
Chloride: 104 mmol/L (ref 101–111)
Creatinine, Ser: 0.71 mg/dL (ref 0.44–1.00)
GFR calc Af Amer: >60 mL/min (ref >60)
GFR calc non Af Amer: >60 mL/min (ref >60)
Glucose, Bld: 111 mg/dL — ABNORMAL HIGH (ref 65–99)
Potassium: 3.9 mmol/L (ref 3.5–5.1)
Sodium: 137 mmol/L (ref 135–145)

## 2016-12-30 LAB — ECHOCARDIOGRAM COMPLETE
E decel time: 239 msec
E/e' ratio: 7.83
FS: 38 % (ref 28–44)
Height: 65 in
IVS/LV PW RATIO, ED: 1.06
LA ID, A-P, ES: 38 mm
LA diam end sys: 38 mm
LA diam index: 1.79 cm/m2
LA vol A4C: 44.4 ml
LA vol index: 21 mL/m2
LA vol: 44.7 mL
LV E/e' medial: 7.83
LV E/e'average: 7.83
LV PW d: 12.1 mm — AB (ref 0.6–1.1)
LV dias vol index: 29 mL/m2
LV dias vol: 62 mL (ref 46–106)
LV e' LATERAL: 12.7 cm/s
LV sys vol index: 12 mL/m2
LV sys vol: 25 mL (ref 14–42)
LVOT SV: 90 mL
LVOT VTI: 28.6 cm
LVOT area: 3.14 cm2
LVOT diameter: 20 mm
LVOT peak grad rest: 6 mmHg
LVOT peak vel: 123 cm/s
Lateral S' vel: 12 cm/s
MV Dec: 239
MV Peak grad: 4 mmHg
MV pk A vel: 85.2 m/s
MV pk E vel: 99.4 m/s
RV sys press: 31 mmHg
Reg peak vel: 242 cm/s
Simpson's disk: 60
Stroke v: 37 ml
TAPSE: 18.2 mm
TDI e' lateral: 12.7
TDI e' medial: 9.25
TR max vel: 242 cm/s
Weight: 3344 oz

## 2016-12-30 LAB — TROPONIN I
Troponin I: <0.03 ng/mL (ref <0.03)
Troponin I: <0.03 ng/mL (ref <0.03)
Troponin I: <0.03 ng/mL (ref <0.03)
Troponin I: <0.03 ng/mL (ref <0.03)

## 2016-12-30 LAB — DIFFERENTIAL
Basophils Absolute: 0 10*3/uL (ref 0.0–0.1)
Basophils Relative: 1 %
Eosinophils Absolute: 0.2 10*3/uL (ref 0.0–0.7)
Eosinophils Relative: 4 %
Lymphocytes Relative: 34 %
Lymphs Abs: 1.9 10*3/uL (ref 0.7–4.0)
Monocytes Absolute: 0.5 10*3/uL (ref 0.1–1.0)
Monocytes Relative: 8 %
Neutro Abs: 2.9 10*3/uL (ref 1.7–7.7)
Neutrophils Relative %: 53 %

## 2016-12-30 MED ORDER — GI COCKTAIL ~~LOC~~
30.0000 mL | Freq: Four times a day (QID) | ORAL | Status: DC | PRN
  Administered 2016-12-30: 30 mL via ORAL
  Filled 2016-12-30: qty 30

## 2016-12-30 MED ORDER — TIZANIDINE HCL 4 MG PO TABS
4.0000 mg | ORAL_TABLET | Freq: Every day | ORAL | Status: DC
  Administered 2016-12-30: 4 mg via ORAL
  Filled 2016-12-30: qty 1

## 2016-12-30 MED ORDER — FLUOXETINE HCL 20 MG PO CAPS
20.0000 mg | ORAL_CAPSULE | Freq: Every day | ORAL | Status: DC
  Administered 2016-12-30: 20 mg via ORAL
  Filled 2016-12-30 (×2): qty 1

## 2016-12-30 MED ORDER — ACETAMINOPHEN 325 MG PO TABS: 650.0000 mg | ORAL_TABLET | ORAL | Status: DC | PRN

## 2016-12-30 MED ORDER — NITROGLYCERIN 0.4 MG SL SUBL
0.4000 mg | SUBLINGUAL_TABLET | SUBLINGUAL | Status: DC | PRN
Start: 2016-12-30 — End: 2016-12-31

## 2016-12-30 MED ORDER — FAMOTIDINE 20 MG PO TABS
20.0000 mg | ORAL_TABLET | Freq: Every day | ORAL | Status: DC
  Administered 2016-12-30: 20 mg via ORAL
  Filled 2016-12-30 (×2): qty 1

## 2016-12-30 MED ORDER — ENOXAPARIN SODIUM 40 MG/0.4ML ~~LOC~~ SOLN
40.0000 mg | SUBCUTANEOUS | Status: DC
  Administered 2016-12-30: 40 mg via SUBCUTANEOUS
  Filled 2016-12-30: qty 0.4

## 2016-12-30 MED ORDER — MORPHINE SULFATE (PF) 4 MG/ML IV SOLN
4.0000 mg | INTRAVENOUS | Status: DC | PRN
  Administered 2016-12-30: 4 mg via INTRAVENOUS
  Filled 2016-12-30: qty 1

## 2016-12-30 MED ORDER — ACETAMINOPHEN 325 MG PO TABS: 650.0000 mg | ORAL_TABLET | Freq: Four times a day (QID) | ORAL | Status: DC | PRN

## 2016-12-30 MED ORDER — DIAZEPAM 2 MG PO TABS: 2.0000 mg | ORAL_TABLET | Freq: Two times a day (BID) | ORAL | Status: DC | PRN

## 2016-12-30 MED ORDER — PANTOPRAZOLE SODIUM 40 MG PO TBEC
40.0000 mg | DELAYED_RELEASE_TABLET | Freq: Every day | ORAL | Status: DC
  Administered 2016-12-30: 40 mg via ORAL
  Filled 2016-12-30 (×2): qty 1

## 2016-12-30 MED ORDER — ONDANSETRON HCL 4 MG/2ML IJ SOLN: 4.0000 mg | Freq: Four times a day (QID) | INTRAMUSCULAR | Status: DC | PRN

## 2016-12-30 MED ORDER — NITROGLYCERIN 0.4 MG SL SUBL
0.4000 mg | SUBLINGUAL_TABLET | SUBLINGUAL | Status: DC | PRN
  Administered 2016-12-30: 0.4 mg via SUBLINGUAL

## 2016-12-30 MED ORDER — ASPIRIN 81 MG PO CHEW
324.0000 mg | CHEWABLE_TABLET | Freq: Once | ORAL | Status: AC
  Administered 2016-12-30: 324 mg via ORAL
  Filled 2016-12-30: qty 4

## 2016-12-30 MED ORDER — FLUTICASONE PROPIONATE 50 MCG/ACT NA SUSP
2.0000 | Freq: Every day | NASAL | Status: DC
  Filled 2016-12-30: qty 16

## 2016-12-30 NOTE — ED Provider Notes (Signed)
[facility] EMERGENCY DEPARTMENT Provider Note   CSN: [id number] Arrival date & time: 12/30/16  5573     History   Chief Complaint Chief Complaint  Patient presents with  . Chest Pain    HPI Ruth [name] is a 76 y.o. female.  HPI  Pt was seen at 0750. Per pt, c/o gradual onset and persistence of waxing and waning mid-sternal chest "pain" since 2100 last night. Pt describes the pain as "discomfort" and "pressure," located in her mid-sternal area. Pt states she took a zantac "and that made it worse." Has been associated with SOB, nausea and "sweating." Pt states she "finally fell asleep" sitting in a chair, but then woke up again several hours ago with the same symptoms "just not as bad." Denies hx of similar symptoms. Denies palpitations, no cough, no abd pain, no vomiting/diarrhea, no back pain, no fevers, no injury.    Past Medical History:  Diagnosis Date  . Anxiety   . Arthritis   . Depression   . Fibromyalgia   . GERD (gastroesophageal reflux disease)    EGD/ colon 1/09  . H pylori ulcer 1980-1990   s/p treatment  . Headache   . Hyperlipidemia   . IBS (irritable bowel syndrome)   . Neck pain   . OSA (obstructive sleep apnea)   . Refusal of blood transfusions as patient is Jehovah's Witness   . Vertigo     Patient Active Problem List   Diagnosis Date Noted  . Family history of early CAD 04/15/2016  . Primary osteoarthritis of both knees 12/31/2015  . Chest pain 07/13/2015  . Atypical chest pain 07/13/2015  . Major depression 03/19/2015  . Vulvodynia 08/03/2014  . Functional constipation 08/03/2014  . Anxiety 07/06/2014  . Fibromyalgia 04/25/2014  . Snoring 04/22/2014  . Prediabetes 03/18/2014  . Hyperlipidemia 03/18/2014  . Osteopenia 03/06/2014  . History of cardiac catheterization 04/28/2013  . Dyspepsia 02/02/2012  . GERD (gastroesophageal reflux disease) 02/02/2012  . Depression 06/18/2011  . Dysphagia 02/05/2011  . CHEST PAIN 02/11/2009  .  GASTROESOPHAGEAL REFLUX DISEASE, CHRONIC 02/15/2007  . IRRITABLE BOWEL SYNDROME 02/15/2007  . Degenerative joint disease 02/15/2007  . REFLUX ESOPHAGITIS 02/14/2007  . GASTRITIS 02/14/2007    Past Surgical History:  Procedure Laterality Date  . ABDOMINAL HYSTERECTOMY  1970  . APPENDECTOMY    . BILATERAL SALPINGOOPHORECTOMY  1990s  . CARDIAC CATHETERIZATION  2002   [facility]) normal coronary arteries   . COLONOSCOPY  01/2007   Dr. Meriel Flavors  . ESOPHAGOGASTRODUODENOSCOPY  01/2007   Dr Sharlett Iles- 3 cm hiatal hernia, benign esophageal biopsies, erosive esophagitis, gastritis  . FOOT SURGERY    . INNER EAR SURGERY     Left  . KNEE SURGERY     Multiple  . SHOULDER SURGERY  2011  . TONSILLECTOMY    . TUBAL LIGATION      OB History    No data available       Home Medications    Prior to Admission medications   Medication Sig Start Date End Date Taking? Authorizing Provider  albuterol (PROVENTIL HFA;VENTOLIN HFA) 108 (90 BASE) MCG/ACT inhaler Inhale 2 puffs into the lungs every 6 (six) hours as needed for wheezing. 02/22/14   Mikey Kirschner, MD  amoxicillin (AMOXIL) 500 MG capsule Take 500 mg by mouth daily. 10/29/16   [provider]  b complex vitamins tablet Take 1 tablet by mouth daily.      [provider]  baclofen (LIORESAL) 10 MG  tablet 1 tablet at 7 am when necessary, 1 tablet at 2 pm when necessary 11/04/16   Panwala, Naitik, PA-C  CALCIUM PO Take 1 tablet by mouth daily. Reported on 08/29/2015    [provider]  Cholecalciferol (VITAMIN D) 2000 UNITS CAPS Take by mouth. Vitamin D3 2000 units    [provider]  Coenzyme Q10 (CO Q 10) 10 MG CAPS Take 1 capsule by mouth daily.      [provider]  diazepam (VALIUM) 5 MG tablet 1 bid prn anxiety Patient not taking: Reported on 11/04/2016 06/26/16   Kathyrn Drown, MD  dicyclomine (BENTYL) 10 MG capsule Take 1 capsule (10 mg total) by mouth 3 (three) times daily as needed.  For stomach cramps 01/23/15   Nilda Simmer, NP  FLUoxetine (PROZAC) 20 MG tablet Take 20 mg by mouth daily.    [provider]  fluticasone (FLONASE) 50 MCG/ACT nasal spray Place 2 sprays into both nostrils daily.    [provider]  glucosamine-chondroitin 500-400 MG tablet Take 1 tablet by mouth 2 (two) times a week.     [provider]  HYDROcodone-acetaminophen (NORCO/VICODIN) 5-325 MG tablet Take 1 tablet by mouth 2 (two) times daily as needed for moderate pain. 05/13/16   Kathyrn Drown, MD  KRILL OIL PO Take 1 tablet by mouth daily.     [provider]  MAGNESIUM PO Take 1,300 mg by mouth daily.     [provider]  nitroGLYCERIN (NITROSTAT) 0.4 MG SL tablet Place 1 tablet (0.4 mg total) under the tongue every 5 (five) minutes as needed for chest pain. 04/14/16   Kathyrn Drown, MD  ondansetron (ZOFRAN ODT) 8 MG disintegrating tablet Take 1 tablet (8 mg total) by mouth every 8 (eight) hours as needed for nausea or vomiting. Patient not taking: Reported on 11/04/2016 09/18/16   Kathyrn Drown, MD  prednisoLONE acetate (PRED FORTE) 1 % ophthalmic suspension PUT 4 DROPS INTO BOTH EARS TWO TIMES A DAY AS NEEDED FOR ITCHING 05/07/16   [provider]  Probiotic Product (PROBIOTIC DAILY PO) Take 1 capsule by mouth daily.     [provider]  ranitidine (ZANTAC) 150 MG tablet Take 1 tablet (150 mg total) 2 (two) times daily by mouth. 12/24/16   Kathyrn Drown, MD  tiZANidine (ZANAFLEX) 4 MG tablet Take 1 tablet (4 mg total) by mouth at bedtime. 11/04/16 05/03/17  Panwala, Naitik, PA-C  triamcinolone cream (KENALOG) 0.1 % Apply 1 application topically 2 (two) times daily. Apply bid Patient not taking: Reported on 11/04/2016 09/18/16   Kathyrn Drown, MD  vitamin B-12 (CYANOCOBALAMIN) 100 MCG tablet Take 100 mcg by mouth daily. Reported on 08/29/2015    [provider]  vitamin C (ASCORBIC ACID) 500 MG tablet Take 500-1,000 mg by  mouth daily.      [provider]    Family History Family History  Problem Relation Age of Onset  . Diabetes Father   . Heart attack Father        18's  . Lung cancer Father   . Cirrhosis Father 33       etoh cirrhosis  . Lung cancer Sister   . Depression Sister   . Alzheimer's disease Sister   . Hypertension Sister   . Diabetes Sister   . Lung cancer Sister   . Paranoid behavior Mother        depression  . Colon polyps Mother  Social History Social History   Tobacco Use  . Smoking status: Never Smoker  . Smokeless tobacco: Never Used  Substance Use Topics  . Alcohol use: Yes    Alcohol/week: 0.0 oz    Comment: Occasionally, holidays/special occasions  . Drug use: No     Allergies   Blood-group specific substance; Ciprofloxacin; Codeine; Vicodin [hydrocodone-acetaminophen]; and Tramadol   Review of Systems Review of Systems ROS: Statement: All systems negative except as marked or noted in the HPI; Constitutional: Negative for fever and chills. ; ; Eyes: Negative for eye pain, redness and discharge. ; ; ENMT: Negative for ear pain, hoarseness, nasal congestion, sinus pressure and sore throat. ; ; Cardiovascular: +SOB, CP, diaphoresis. Negative for palpitations, and peripheral edema. ; ; Respiratory: Negative for cough, wheezing and stridor. ; ; Gastrointestinal: +nausea. Negative for vomiting, diarrhea, abdominal pain, blood in stool, hematemesis, jaundice and rectal bleeding. . ; ; Genitourinary: Negative for dysuria, flank pain and hematuria. ; ; Musculoskeletal: Negative for back pain and neck pain. Negative for swelling and trauma.; ; Skin: Negative for pruritus, rash, abrasions, blisters, bruising and skin lesion.; ; Neuro: Negative for headache, lightheadedness and neck stiffness. Negative for weakness, altered level of consciousness, altered mental status, extremity weakness, paresthesias, involuntary movement, seizure and syncope.       Physical  Exam Updated Vital Signs BP 134/64 (BP Location: Left Arm)   Pulse 76   Temp 97.8 F (36.6 C) (Oral)   Resp 18   Ht 5\' 5"  (1.651 m)   Wt 94.8 kg (209 lb)   SpO2 97%   BMI 34.78 kg/m   Physical Exam 0755: Physical examination:  Nursing notes reviewed; Vital signs and O2 SAT reviewed;  Constitutional: Well developed, Well nourished, Well hydrated, In no acute distress; Head:  Normocephalic, atraumatic; Eyes: EOMI, PERRL, No scleral icterus; ENMT: Mouth and pharynx normal, Mucous membranes moist; Neck: Supple, Full range of motion, No lymphadenopathy; Cardiovascular: Regular rate and rhythm, No gallop; Respiratory: Breath sounds clear & equal bilaterally, No wheezes.  Speaking full sentences with ease, Normal respiratory effort/excursion; Chest: Nontender, Movement normal; Abdomen: Soft, Nontender, Nondistended, Normal bowel sounds; Genitourinary: No CVA tenderness; Extremities: Pulses normal, No tenderness, No edema, No calf edema or asymmetry.; Neuro: AA&Ox3, Major CN grossly intact.  Speech clear. No gross focal motor or sensory deficits in extremities.; Skin: Color normal, Warm, Dry.   ED Treatments / Results  Labs (all labs ordered are listed, but only abnormal results are displayed)   EKG  EKG Interpretation  Date/Time:  Wednesday December 30 2016 07:44:47 EST Ventricular Rate:  79 PR Interval:    QRS Duration: 87 QT Interval:  387 QTC Calculation: 444 R Axis:   31 Text Interpretation:  Sinus rhythm Low voltage, precordial leads Baseline wander When compared with ECG of 07/13/2015 No significant change was found Confirmed by Francine Graven [id number]) on 12/30/2016 8:01:14 AM       Radiology   Procedures Procedures (including critical care time)  Medications Ordered in ED Medications  aspirin chewable tablet 324 mg (not administered)  nitroGLYCERIN (NITROSTAT) SL tablet 0.4 mg (not administered)  morphine 4 MG/ML injection 4 mg (not administered)     Initial  Impression / Assessment and Plan / ED Course  I have reviewed the triage vital signs and the nursing notes.  Pertinent labs & imaging results that were available during my care of the patient were reviewed by me and considered in my medical decision making (see chart for details).  MDM  Reviewed: previous chart, nursing note and vitals Reviewed previous: labs and ECG Interpretation: labs, ECG and x-ray    Results for orders placed or performed during the hospital encounter of 22/02/54  Basic metabolic panel  Result Value Ref Range   Sodium 137 135 - 145 mmol/L   Potassium 3.9 3.5 - 5.1 mmol/L   Chloride 104 101 - 111 mmol/L   CO2 24 22 - 32 mmol/L   Glucose, Bld 111 (H) 65 - 99 mg/dL   BUN 19 6 - 20 mg/dL   Creatinine, Ser 0.71 0.44 - 1.00 mg/dL   Calcium 9.1 8.9 - 10.3 mg/dL   GFR calc non Af Amer >60 >60 mL/min   GFR calc Af Amer >60 >60 mL/min   Anion gap 9 5 - 15  CBC  Result Value Ref Range   WBC 5.1 4.0 - 10.5 K/uL   RBC 3.99 3.87 - 5.11 MIL/uL   Hemoglobin 12.7 12.0 - 15.0 g/dL   HCT 38.1 36.0 - 46.0 %   MCV 95.5 78.0 - 100.0 fL   MCH 31.8 26.0 - 34.0 pg   MCHC 33.3 30.0 - 36.0 g/dL   RDW 13.6 11.5 - 15.5 %   Platelets 223 150 - 400 K/uL  Troponin I  Result Value Ref Range   Troponin I <0.03 <0.03 ng/mL  Differential  Result Value Ref Range   Neutrophils Relative % 53 %   Neutro Abs 2.9 1.7 - 7.7 K/uL   Lymphocytes Relative 34 %   Lymphs Abs 1.9 0.7 - 4.0 K/uL   Monocytes Relative 8 %   Monocytes Absolute 0.5 0.1 - 1.0 K/uL   Eosinophils Relative 4 %   Eosinophils Absolute 0.2 0.0 - 0.7 K/uL   Basophils Relative 1 %   Basophils Absolute 0.0 0.0 - 0.1 K/uL   Dg Chest 2 View Result Date: 12/30/2016 CLINICAL DATA:  Chest pain. EXAM: CHEST  2 VIEW COMPARISON:  Radiographs of Jul 13, 2015. FINDINGS: The heart size and mediastinal contours are within normal limits. Both lungs are clear. No pneumothorax or pleural effusion is noted. The visualized skeletal  structures are unremarkable. IMPRESSION: No active cardiopulmonary disease. Electronically Signed   By: Marijo Conception, M.D.   On: 12/30/2016 08:45    0915:  05/2016 Cards office note: makes mention that pt refuses tx for hyperlipidemia, also has FHx early CAD (father).  Pt initially refused SL ntg and IV morphine, d/w pt regarding benefits of same. ASA already given. Pt now agreeable. Feels better after meds. Dx and testing d/w pt and family.  Questions answered.  Verb understanding, agreeable to admit. T/C to [facility] Dr.Hernandez, case discussed, including:  HPI, pertinent PM/SHx, VS/PE, dx testing, ED course and treatment:  Agreeable to admit.     Final Clinical Impressions(s) / ED Diagnoses   Final diagnoses:  None    ED Discharge Orders    None       Francine Graven, DO 01/01/17 1419

## 2016-12-30 NOTE — ED Triage Notes (Addendum)
Pt reports history of acid reflux and took zantac last night and reports increase in discomfort after taking medication. Pt reports this is the most intense discomfort I have had. Pt reports shortness of breath at baseline and denies any changes in dyspnea. Pt reports intermittent "sweating" and nausea since last night as well.

## 2016-12-30 NOTE — ED Notes (Signed)
Pt refused any pain medication

## 2016-12-30 NOTE — H&P (Signed)
History and Physical    Ruth [name] LFY:[id number] DOB: 19-Jul-1940 DOA: 12/30/2016  Referring MD/NP/PA: Francine Graven, EDP PCP: Kathyrn Drown, MD  Patient coming from: Home  Chief Complaint: Chest pain  HPI: Ruth [name] is a 76 y.o. female with history of GERD, fibromyalgia, hyperlipidemia who is not on medications.  She had substernal/epigastric pain that began last night at about 10 PM.  She thought it was her GERD and took some Zantac without improvement.  Went to bed and woke up this morning still with chest pain.  This time she actually had some nausea and decided to seek care in the ED.  Vital signs are within normal limits, labs are unremarkable, initial troponin is less than 0.03, chest x-ray without active disease, EKG shows sinus rhythm, admission requested.  Past Medical/Surgical History: Past Medical History:  Diagnosis Date  . Anxiety   . Arthritis   . Depression   . Fibromyalgia   . GERD (gastroesophageal reflux disease)    EGD/ colon 1/09  . H pylori ulcer 1980-1990   s/p treatment  . Headache   . Hyperlipidemia   . IBS (irritable bowel syndrome)   . Neck pain   . OSA (obstructive sleep apnea)   . Refusal of blood transfusions as patient is Jehovah's Witness   . Vertigo     Past Surgical History:  Procedure Laterality Date  . ABDOMINAL HYSTERECTOMY  1970  . APPENDECTOMY    . BILATERAL SALPINGOOPHORECTOMY  1990s  . CARDIAC CATHETERIZATION  2002   [facility]) normal coronary arteries   . COLONOSCOPY  01/2007   Dr. Meriel Flavors  . ESOPHAGOGASTRODUODENOSCOPY  01/2007   Dr Sharlett Iles- 3 cm hiatal hernia, benign esophageal biopsies, erosive esophagitis, gastritis  . FOOT SURGERY    . INNER EAR SURGERY     Left  . KNEE SURGERY     Multiple  . SHOULDER SURGERY  2011  . TONSILLECTOMY    . TUBAL LIGATION      Social History:  reports that  has never smoked. she has never used smokeless tobacco. She reports that she drinks alcohol. She reports  that she does not use drugs.  Allergies: Allergies  Allergen Reactions  . Blood-Group Specific Substance     NO BLOOD PRODUCTS-refuses transfusions  . Ciprofloxacin Other (See Comments)    Pt reports extreme fatigue  . Codeine Nausea And Vomiting  . Vicodin [Hydrocodone-Acetaminophen]     Nausea and vomiting  . Tramadol     Increased heart rate,increased blood pressure, edema    Family History:  Family History  Problem Relation Age of Onset  . Diabetes Father   . Heart attack Father        41's  . Lung cancer Father   . Cirrhosis Father 39       etoh cirrhosis  . Lung cancer Sister   . Depression Sister   . Alzheimer's disease Sister   . Hypertension Sister   . Diabetes Sister   . Lung cancer Sister   . Paranoid behavior Mother        depression  . Colon polyps Mother     Prior to Admission medications   Medication Sig Start Date End Date Taking? Authorizing Provider  albuterol (PROVENTIL HFA;VENTOLIN HFA) 108 (90 BASE) MCG/ACT inhaler Inhale 2 puffs into the lungs every 6 (six) hours as needed for wheezing. 02/22/14  Yes Mikey Kirschner, MD  b complex vitamins tablet Take 1 tablet by mouth daily.  Yes [provider]  CALCIUM PO Take 1 tablet by mouth daily. Reported on 08/29/2015   Yes [provider]  Cholecalciferol (VITAMIN D) 2000 UNITS CAPS Take by mouth. Vitamin D3 2000 units   Yes [provider]  Coenzyme Q10 (CO Q 10) 10 MG CAPS Take 1 capsule by mouth daily.     Yes [provider]  diazepam (VALIUM) 5 MG tablet 1 bid prn anxiety 06/26/16  Yes Luking, Scott A, MD  FLUoxetine (PROZAC) 20 MG tablet Take 20 mg by mouth daily.   Yes [provider]  fluticasone (FLONASE) 50 MCG/ACT nasal spray Place 2 sprays into both nostrils daily.   Yes [provider]  glucosamine-chondroitin 500-400 MG tablet Take 1 tablet by mouth 2 (two) times a week.    Yes [provider]  HYDROcodone-acetaminophen  (NORCO/VICODIN) 5-325 MG tablet Take 1 tablet by mouth 2 (two) times daily as needed for moderate pain. 05/13/16  Yes Luking, Scott A, MD  KRILL OIL PO Take 1 tablet by mouth daily.    Yes [provider]  MAGNESIUM PO Take 1,300 mg by mouth daily.    Yes [provider]  nitroGLYCERIN (NITROSTAT) 0.4 MG SL tablet Place 1 tablet (0.4 mg total) under the tongue every 5 (five) minutes as needed for chest pain. 04/14/16  Yes Kathyrn Drown, MD  Probiotic Product (PROBIOTIC DAILY PO) Take 1 capsule by mouth daily.    Yes [provider]  ranitidine (ZANTAC) 150 MG tablet Take 1 tablet (150 mg total) 2 (two) times daily by mouth. 12/24/16  Yes Luking, Elayne Snare, MD  tiZANidine (ZANAFLEX) 4 MG tablet Take 1 tablet (4 mg total) by mouth at bedtime. 11/04/16 05/03/17 Yes Panwala, Naitik, PA-C  vitamin C (ASCORBIC ACID) 500 MG tablet Take 500-1,000 mg by mouth daily.     Yes [provider]  amoxicillin (AMOXIL) 500 MG capsule Take 500 mg by mouth daily. 10/29/16   [provider]  baclofen (LIORESAL) 10 MG tablet 1 tablet at 7 am when necessary, 1 tablet at 2 pm when necessary Patient not taking: Reported on 12/30/2016 11/04/16   Panwala, Naitik, PA-C  dicyclomine (BENTYL) 10 MG capsule Take 1 capsule (10 mg total) by mouth 3 (three) times daily as needed. For stomach cramps Patient not taking: Reported on 12/30/2016 01/23/15   Nilda Simmer, NP  ondansetron (ZOFRAN ODT) 8 MG disintegrating tablet Take 1 tablet (8 mg total) by mouth every 8 (eight) hours as needed for nausea or vomiting. Patient not taking: Reported on 11/04/2016 09/18/16   Kathyrn Drown, MD  prednisoLONE acetate (PRED FORTE) 1 % ophthalmic suspension PUT 4 DROPS INTO BOTH EARS TWO TIMES A DAY AS NEEDED FOR ITCHING 05/07/16   [provider]  triamcinolone cream (KENALOG) 0.1 % Apply 1 application topically 2 (two) times daily. Apply bid Patient not taking: Reported on 11/04/2016 09/18/16    Kathyrn Drown, MD    Review of Systems:  Constitutional: Denies fever, chills, diaphoresis, appetite change and fatigue.  HEENT: Denies photophobia, eye pain, redness, hearing loss, ear pain, congestion, sore throat, rhinorrhea, sneezing, mouth sores, trouble swallowing, neck pain, neck stiffness and tinnitus.   Respiratory: Denies SOB, DOE, cough, chest tightness,  and wheezing.   Cardiovascular: Denies palpitations and leg swelling.  Gastrointestinal: Denies nausea, vomiting, abdominal pain, diarrhea, constipation, blood in stool and abdominal distention.  Genitourinary: Denies dysuria, urgency, frequency, hematuria, flank pain and difficulty urinating.  Endocrine: Denies: hot  or cold intolerance, sweats, changes in hair or nails, polyuria, polydipsia. Musculoskeletal: Denies myalgias, back pain, joint swelling, arthralgias and gait problem.  Skin: Denies pallor, rash and wound.  Neurological: Denies dizziness, seizures, syncope, weakness, light-headedness, numbness and headaches.  Hematological: Denies adenopathy. Easy bruising, personal or family bleeding history  Psychiatric/Behavioral: Denies suicidal ideation, mood changes, confusion, nervousness, sleep disturbance and agitation    Physical Exam: Vitals:   12/30/16 0742 12/30/16 0749 12/30/16 0830 12/30/16 0930  BP:  134/64 121/66 133/61  Pulse:  76 69 63  Resp:  18 18 13   Temp:  97.8 F (36.6 C)    TempSrc:  Oral    SpO2:  97% 99% 100%  Weight: 94.8 kg (209 lb)     Height: 5\' 5"  (1.651 m)        Constitutional: NAD, calm, comfortable Eyes: PERRL, lids and conjunctivae normal ENMT: Mucous membranes are moist. Posterior pharynx clear of any exudate or lesions.Normal dentition.  Neck: normal, supple, no masses, no thyromegaly Respiratory: clear to auscultation bilaterally, no wheezing, no crackles. Normal respiratory effort. No accessory muscle use.  Cardiovascular: Regular rate and rhythm, no murmurs / rubs / gallops.  No extremity edema. 2+ pedal pulses. No carotid bruits.  Abdomen: no tenderness, no masses palpated. No hepatosplenomegaly. Bowel sounds positive.  Musculoskeletal: no clubbing / cyanosis. No joint deformity upper and lower extremities. Good ROM, no contractures. Normal muscle tone.  Skin: no rashes, lesions, ulcers. No induration Neurologic: CN 2-12 grossly intact. Sensation intact, DTR normal. Strength 5/5 in all 4.  Psychiatric: Normal judgment and insight. Alert and oriented x 3. Normal mood.    Labs on Admission: I have personally reviewed the following labs and imaging studies  CBC: Recent Labs  Lab 12/30/16 0745 12/30/16 0757  WBC 5.1  --   NEUTROABS  --  2.9  HGB 12.7  --   HCT 38.1  --   MCV 95.5  --   PLT 223  --    Basic Metabolic Panel: Recent Labs  Lab 12/30/16 0745  NA 137  K 3.9  CL 104  CO2 24  GLUCOSE 111*  BUN 19  CREATININE 0.71  CALCIUM 9.1   GFR: Estimated Creatinine Clearance: 69.2 mL/min (by C-G formula based on SCr of 0.71 mg/dL). Liver Function Tests: No results for input(s): AST, ALT, ALKPHOS, BILITOT, PROT, ALBUMIN in the last 168 hours. No results for input(s): LIPASE, AMYLASE in the last 168 hours. No results for input(s): AMMONIA in the last 168 hours. Coagulation Profile: No results for input(s): INR, PROTIME in the last 168 hours. Cardiac Enzymes: Recent Labs  Lab 12/30/16 0745  TROPONINI <0.03   BNP (last 3 results) No results for input(s): PROBNP in the last 8760 hours. HbA1C: No results for input(s): HGBA1C in the last 72 hours. CBG: No results for input(s): GLUCAP in the last 168 hours. Lipid Profile: No results for input(s): CHOL, HDL, LDLCALC, TRIG, CHOLHDL, LDLDIRECT in the last 72 hours. Thyroid Function Tests: No results for input(s): TSH, T4TOTAL, FREET4, T3FREE, THYROIDAB in the last 72 hours. Anemia Panel: No results for input(s): VITAMINB12, FOLATE, FERRITIN, TIBC, IRON, RETICCTPCT in the last 72 hours. Urine  analysis:    Component Value Date/Time   BILIRUBINUR + 05/19/2016 1143   UROBILINOGEN 2.0 (A) 05/19/2016 1143   Sepsis Labs: @LABRCNTIP (procalcitonin:4,lacticidven:4) )No results found for this or any previous visit (from the past 240 hour(s)).   Radiological Exams on Admission: Dg Chest 2 View  Result Date: 12/30/2016 CLINICAL  DATA:  Chest pain. EXAM: CHEST  2 VIEW COMPARISON:  Radiographs of Jul 13, 2015. FINDINGS: The heart size and mediastinal contours are within normal limits. Both lungs are clear. No pneumothorax or pleural effusion is noted. The visualized skeletal structures are unremarkable. IMPRESSION: No active cardiopulmonary disease. Electronically Signed   By: Marijo Conception, M.D.   On: 12/30/2016 08:45    EKG: Independently reviewed.  Sinus rhythm, low voltage, no acute ischemic changes  Assessment/Plan Principal Problem:   Chest pain Active Problems:   Reflux esophagitis   GASTROESOPHAGEAL REFLUX DISEASE, CHRONIC   Hyperlipidemia   Fibromyalgia    Chest pain -My first impression is that this is unlikely to be ACS and is likely related to GERD. -Admit to telemetry for ACS rule out, cycle troponins and EKG. -Nitroglycerin as needed. -Check 2D echo, if no abnormalities likely discharge home within 24 hours.  GERD -Continue twice daily H2 blocker. -Start PPI.  Hyperlipidemia -Refuses treatment, states she has had this for over 30 years, states her PCP follows this.   DVT prophylaxis: Lovenox Code Status: Full code Family Communication: Patient only Disposition Plan: Anticipate discharge home in 24 hours Consults called: None Admission status: Observation   Time Spent: 85 minutes  Lelon Frohlich MD [facility] Pager [phone]  If 7PM-7AM, please contact night-coverage [email or web address] Password [email or web address]  12/30/2016, 11:51 AM

## 2016-12-30 NOTE — Care Management Obs Status (Signed)
[facility] NOTIFICATION   Patient Details  Name: Ruth [name] MRN: [id number] Date of Birth: Jul 15, 1940   Medicare Observation Status Notification Given:  Yes    Sherald Barge, RN 12/30/2016, 2:57 PM

## 2016-12-30 NOTE — Progress Notes (Signed)
*  PRELIMINARY RESULTS* Echocardiogram 2D Echocardiogram has been performed.  Ruth [name] 12/30/2016, 3:56 PM

## 2016-12-31 DIAGNOSIS — E7801 Familial hypercholesterolemia: Secondary | ICD-10-CM | POA: Diagnosis not present

## 2016-12-31 DIAGNOSIS — K210 Gastro-esophageal reflux disease with esophagitis: Secondary | ICD-10-CM | POA: Diagnosis not present

## 2016-12-31 DIAGNOSIS — R079 Chest pain, unspecified: Secondary | ICD-10-CM | POA: Diagnosis not present

## 2016-12-31 NOTE — Discharge Summary (Signed)
Physician Discharge Summary  Ruth [name] ZHG:[id number] DOB: April 16, 1940 DOA: 12/30/2016  PCP: Kathyrn Drown, MD  Admit date: 12/30/2016 Discharge date: 12/31/2016  Time spent: 45 minutes  Recommendations for Outpatient Follow-up:  -Will be discharged home today. -Advised to follow up with PCP in 2 weeks.   Discharge Diagnoses:  Principal Problem:   Chest pain Active Problems:   Reflux esophagitis   GASTROESOPHAGEAL REFLUX DISEASE, CHRONIC   Hyperlipidemia   Fibromyalgia   Discharge Condition: Stable and improved  Filed Weights   12/30/16 0742  Weight: 94.8 kg (209 lb)    History of present illness:  Ruth [name] is a 76 y.o. female with history of GERD, fibromyalgia, hyperlipidemia who is not on medications.  She had substernal/epigastric pain that began last night at about 10 PM.  She thought it was her GERD and took some Zantac without improvement.  Went to bed and woke up this morning still with chest pain.  This time she actually had some nausea and decided to seek care in the ED.  Vital signs are within normal limits, labs are unremarkable, initial troponin is less than 0.03, chest x-ray without active disease, EKG shows sinus rhythm, admission requested.    Hospital Course:   Chest Pain -Ruled out for ACS by negative troponins and EKGs without acute ischemic changes. -ECHO without abnormalities. -Low risk for PE by Wells Criteria. -CP likely related to her known h/o GERD -No further cardiac work up at this point.  Procedures: ECHO:  Systolic function was normal.   The estimated ejection fraction was in the range of 60% to 65%.   Wall motion was normal; there were no regional wall motion   abnormalities. Left ventricular diastolic function parameters    were normal.  Consultations:  None  Discharge Instructions   Allergies as of 12/31/2016      Reactions   Blood-group Specific Substance    NO BLOOD PRODUCTS-refuses transfusions   Ciprofloxacin Other (See Comments)   Pt reports extreme fatigue   Codeine Nausea And Vomiting   Vicodin [hydrocodone-acetaminophen]    Nausea and vomiting   Tramadol    Increased heart rate,increased blood pressure, edema      Medication List    STOP taking these medications   amoxicillin 500 MG capsule Commonly known as:  AMOXIL     TAKE these medications   albuterol 108 (90 Base) MCG/ACT inhaler Commonly known as:  PROVENTIL HFA;VENTOLIN HFA Inhale 2 puffs into the lungs every 6 (six) hours as needed for wheezing.   b complex vitamins tablet Take 1 tablet by mouth daily.   baclofen 10 MG tablet Commonly known as:  LIORESAL 1 tablet at 7 am when necessary, 1 tablet at 2 pm when necessary   CALCIUM PO Take 1 tablet by mouth daily. Reported on 08/29/2015   Co Q 10 10 MG Caps Take 1 capsule by mouth daily.   diazepam 5 MG tablet Commonly known as:  VALIUM 1 bid prn anxiety   dicyclomine 10 MG capsule Commonly known as:  BENTYL Take 1 capsule (10 mg total) by mouth 3 (three) times daily as needed. For stomach cramps   FLUoxetine 20 MG tablet Commonly known as:  PROZAC Take 20 mg by mouth daily.   fluticasone 50 MCG/ACT nasal spray Commonly known as:  FLONASE Place 2 sprays into both nostrils daily.   glucosamine-chondroitin 500-400 MG tablet Take 1 tablet by mouth 2 (two) times a week.   HYDROcodone-acetaminophen 5-325  MG tablet Commonly known as:  NORCO/VICODIN Take 1 tablet by mouth 2 (two) times daily as needed for moderate pain.   KRILL OIL PO Take 1 tablet by mouth daily.   MAGNESIUM PO Take 1,300 mg by mouth daily.   nitroGLYCERIN 0.4 MG SL tablet Commonly known as:  NITROSTAT Place 1 tablet (0.4 mg total) under the tongue every 5 (five) minutes as needed for chest pain.   ondansetron 8 MG disintegrating tablet Commonly known as:  ZOFRAN ODT Take 1 tablet (8 mg total) by mouth every 8 (eight) hours as needed for nausea or vomiting.     prednisoLONE acetate 1 % ophthalmic suspension Commonly known as:  PRED FORTE PUT 4 DROPS INTO BOTH EARS TWO TIMES A DAY AS NEEDED FOR ITCHING   PROBIOTIC DAILY PO Take 1 capsule by mouth daily.   ranitidine 150 MG tablet Commonly known as:  ZANTAC Take 1 tablet (150 mg total) 2 (two) times daily by mouth.   tiZANidine 4 MG tablet Commonly known as:  ZANAFLEX Take 1 tablet (4 mg total) by mouth at bedtime.   triamcinolone cream 0.1 % Commonly known as:  KENALOG Apply 1 application topically 2 (two) times daily. Apply bid   vitamin C 500 MG tablet Commonly known as:  ASCORBIC ACID Take 500-1,000 mg by mouth daily.   Vitamin D 2000 units Caps Take by mouth. Vitamin D3 2000 units      Allergies  Allergen Reactions  . Blood-Group Specific Substance     NO BLOOD PRODUCTS-refuses transfusions  . Ciprofloxacin Other (See Comments)    Pt reports extreme fatigue  . Codeine Nausea And Vomiting  . Vicodin [Hydrocodone-Acetaminophen]     Nausea and vomiting  . Tramadol     Increased heart rate,increased blood pressure, edema   Follow-up Information    Kathyrn Drown, MD. Schedule an appointment as soon as possible for a visit in 2 week(s).   Specialty:  Family Medicine Contact information: [address]  [place] [address] [phone]            The results of significant diagnostics from this hospitalization (including imaging, microbiology, ancillary and laboratory) are listed below for reference.    Significant Diagnostic Studies: Dg Chest 2 View  Result Date: 12/30/2016 CLINICAL DATA:  Chest pain. EXAM: CHEST  2 VIEW COMPARISON:  Radiographs of Jul 13, 2015. FINDINGS: The heart size and mediastinal contours are within normal limits. Both lungs are clear. No pneumothorax or pleural effusion is noted. The visualized skeletal structures are unremarkable. IMPRESSION: No active cardiopulmonary disease. Electronically Signed   By: Marijo Conception, M.D.    On: 12/30/2016 08:45    Microbiology: No results found for this or any previous visit (from the past 240 hour(s)).   Labs: Basic Metabolic Panel: Recent Labs  Lab 12/30/16 0745 12/30/16 1231  NA 137  --   K 3.9  --   CL 104  --   CO2 24  --   GLUCOSE 111*  --   BUN 19  --   CREATININE 0.71 0.67  CALCIUM 9.1  --    Liver Function Tests: No results for input(s): AST, ALT, ALKPHOS, BILITOT, PROT, ALBUMIN in the last 168 hours. No results for input(s): LIPASE, AMYLASE in the last 168 hours. No results for input(s): AMMONIA in the last 168 hours. CBC: Recent Labs  Lab 12/30/16 0745 12/30/16 0757 12/30/16 1231  WBC 5.1  --  7.3  NEUTROABS  --  2.9  --   HGB 12.7  --  12.5  HCT 38.1  --  38.2  MCV 95.5  --  96.5  PLT 223  --  245   Cardiac Enzymes: Recent Labs  Lab 12/30/16 0745 12/30/16 1231 12/30/16 1509 12/30/16 1717  TROPONINI <0.03 <0.03 <0.03 <0.03   BNP: BNP (last 3 results) No results for input(s): BNP in the last 8760 hours.  ProBNP (last 3 results) No results for input(s): PROBNP in the last 8760 hours.  CBG: No results for input(s): GLUCAP in the last 168 hours.     SignedLelon Frohlich  [facility] Pager: [phone] 12/31/2016, 11:47 AM

## 2017-01-04 ENCOUNTER — Telehealth: Payer: Self-pay | Admitting: Family Medicine

## 2017-01-04 DIAGNOSIS — E785 Hyperlipidemia, unspecified: Secondary | ICD-10-CM

## 2017-01-04 DIAGNOSIS — R5383 Other fatigue: Secondary | ICD-10-CM

## 2017-01-04 DIAGNOSIS — Z79899 Other long term (current) drug therapy: Secondary | ICD-10-CM

## 2017-01-04 NOTE — Telephone Encounter (Signed)
Patient recently went to the hospital for chest pains. Nothing came of it but she said they did a bunch of labwork on her.  She was suppose to go have labwork done for Dr. Nicki Reaper and wanted to know if she still needed to do that or if they covered everything with the labs she had done last week.  If she still needs to go get something done she will.

## 2017-01-05 NOTE — Telephone Encounter (Signed)
Blood work ordered in [employer]. Patient notified and scheduled follow up office visit in a few weeks.

## 2017-01-05 NOTE — Telephone Encounter (Signed)
Patient advised per Dr Nicki Reaper :1.  We do not do shingles vaccine.  #2 according to the recent record her next colonoscopy will be due in 2022 #3 according to the GI note from 2016 they did not recommend a follow-up EGD unless the patient is having significant heartburn or significant dysphagia/food getting stuck if she is having any these problems Dr Nicki Reaper would recommend a regular scheduled office visit then we can we refer her back to gastroenterology/Dr.Rehman. Patient verbalized understanding.

## 2017-01-05 NOTE — Telephone Encounter (Signed)
The only lab work they did not do would be TSH, lipid, liver-hyperlipidemia, fatigue we can order these test separately the patient can complete these it would not be a bad idea for the patient to do a follow-up visit somewhere within the next few weeks for routine follow-up at her discretion

## 2017-01-06 ENCOUNTER — Ambulatory Visit (INDEPENDENT_AMBULATORY_CARE_PROVIDER_SITE_OTHER): Payer: Self-pay | Admitting: Internal Medicine

## 2017-01-06 ENCOUNTER — Encounter (INDEPENDENT_AMBULATORY_CARE_PROVIDER_SITE_OTHER): Payer: Self-pay | Admitting: *Deleted

## 2017-01-20 ENCOUNTER — Ambulatory Visit: Payer: PPO | Admitting: Family Medicine

## 2017-01-20 ENCOUNTER — Telehealth: Payer: Self-pay | Admitting: Family Medicine

## 2017-01-20 ENCOUNTER — Encounter: Payer: Self-pay | Admitting: Family Medicine

## 2017-01-20 VITALS — BP 122/64 | Ht 65.0 in | Wt 220.0 lb

## 2017-01-20 DIAGNOSIS — M797 Fibromyalgia: Secondary | ICD-10-CM

## 2017-01-20 DIAGNOSIS — R5383 Other fatigue: Secondary | ICD-10-CM

## 2017-01-20 MED ORDER — HYDROCODONE-ACETAMINOPHEN 5-325 MG PO TABS: 1.0000 | ORAL_TABLET | Freq: Two times a day (BID) | ORAL | 0 refills | Status: DC | PRN

## 2017-01-20 NOTE — Telephone Encounter (Signed)
°  Patient was seen today and there was a problem with her registry and the hydrocodone.  She went to the pharmacy to see what they said.  Pharmacist at [facility] said that the registry has her linked with another Raelyn Number with a similar DOB (09/09/1946) who is a patient with Dr. Luan Pulling.  That if you scroll all the way down to the bottom it will say patient 1 and patient 2.  The pharmacist had no idea how to fix this.  He told her the other patient got her meds filled at [facility] on December 1st but our patient uses [facility] and hasn't gotten the pain meds filled in a while. (Seems like this would be a big [employer] violation?)  She doesn't know how to get this corrected and is upset that she is linked to another patient.  Would like a call back from Dr. Nicki Reaper with advice.  No hurry, she said he can call tomorrow or when he has time.

## 2017-01-20 NOTE — Telephone Encounter (Signed)
Please advise 

## 2017-01-20 NOTE — Telephone Encounter (Signed)
Patient be aware that I will call the state drug registry phone number and discuss with them if they know of the way to correct this issue this may take a few days ( PMP [phone])

## 2017-01-20 NOTE — Progress Notes (Signed)
   Subjective:    [name], female    DOB: August 13, 1940, 76 y.o.   MRN: [id number]  HPIHospitalization followup. Went because she was having chest pain. Pt not having any symptoms now.  She had significant workup in the ER this was reviewed with her test by test 15-20 minutes spent with the patient greater than half in discussion Needs refill on hydrocodone. Does not take every day. Takes for fibromyalgia.   She uses this sparingly for fibromyalgia  Review of Systems     Objective:   Physical Exam  Lungs clear heart regular pulse normal extremities no edema      Assessment & Plan:  Fibromyalgia antidepressant helps her In addition to this hydrocodone is used sparingly and new prescription was given Chest pain was probably reflux related if she has ongoing trouble she will follow-up Once again greater than half the time spent with patient in great 15 minutes spent with patient [phone]

## 2017-01-21 NOTE — Telephone Encounter (Signed)
I called unable to go through.

## 2017-01-26 DIAGNOSIS — M7742 Metatarsalgia, left foot: Secondary | ICD-10-CM | POA: Diagnosis not present

## 2017-01-26 DIAGNOSIS — M216X2 Other acquired deformities of left foot: Secondary | ICD-10-CM | POA: Diagnosis not present

## 2017-01-26 DIAGNOSIS — M2042 Other hammer toe(s) (acquired), left foot: Secondary | ICD-10-CM | POA: Diagnosis not present

## 2017-02-08 ENCOUNTER — Telehealth: Payer: Self-pay | Admitting: Family Medicine

## 2017-02-08 MED ORDER — VALACYCLOVIR HCL 1 G PO TABS: 1000.0000 mg | ORAL_TABLET | Freq: Three times a day (TID) | ORAL | 0 refills | Status: DC

## 2017-02-08 NOTE — Telephone Encounter (Signed)
Patient called back and wanted to let Dr. Nicki Reaper know that she has a very active spot of eczema on her arm which she scratched after touching the fever blister.  She wants to make sure that this doesn't change what he decided to call in.

## 2017-02-08 NOTE — Telephone Encounter (Signed)
I think it would be fine based on what we are prescribing certainly if things are not improving by later in the week we should take a look

## 2017-02-08 NOTE — Telephone Encounter (Signed)
Patient has a fever blister on her mouth and in her nostril.  She is requesting Rx called in.  [place]

## 2017-02-08 NOTE — Telephone Encounter (Signed)
For this it is recommended to be Valtrex 1 g, 1 pill 3 times a day for the next 5 days-follow-up if any problems-if the area in the nose is getting worse patient will need to be seen

## 2017-02-08 NOTE — Telephone Encounter (Signed)
Prescription sent electronically to pharmacy. Patient notified. 

## 2017-03-12 DIAGNOSIS — Z4789 Encounter for other orthopedic aftercare: Secondary | ICD-10-CM | POA: Diagnosis not present

## 2017-03-12 DIAGNOSIS — M2042 Other hammer toe(s) (acquired), left foot: Secondary | ICD-10-CM | POA: Diagnosis not present

## 2017-03-12 DIAGNOSIS — M79672 Pain in left foot: Secondary | ICD-10-CM | POA: Diagnosis not present

## 2017-03-16 ENCOUNTER — Ambulatory Visit: Payer: PPO

## 2017-03-18 IMAGING — MR MR ABDOMEN WO/W CM
9 of 18 series · 21 of 48 positions shown · IV contrast (multihance)
Comparison: Renal ultrasound 02/12/2016.  No prior abdominal MRI.

CLINICAL DATA: 75-year-old female with history of cystic renal mass
noted on prior outside ultrasound examination. Followup study.

EXAM:
MRI ABDOMEN WITHOUT AND WITH CONTRAST
TECHNIQUE: Multiplanar multisequence MR imaging of the abdomen was performed
both before and after the administration of intravenous contrast.
CONTRAST:  19mL MULTIHANCE GADOBENATE DIMEGLUMINE 529 MG/ML IV SOLN

[Series 3: DWI b500 · axial · 6.0mm · 1.48mm/px · z∈[-135,+138]mm · 3 of 72 slices shown]
[im 1/72]
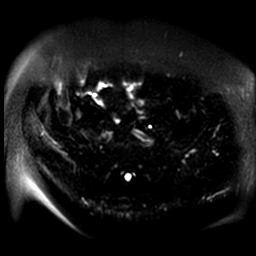
[im 36/72]
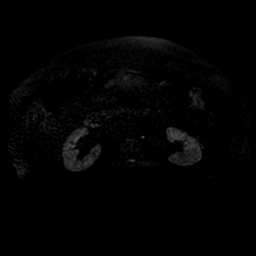
[im 72/72]
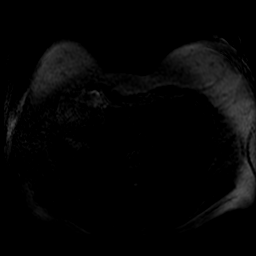

[Series 4: T2 fat-sat · axial · 5.0mm · 0.78mm/px · z∈[-130,+130]mm · 3 of 53 slices shown]
[im 1/53]
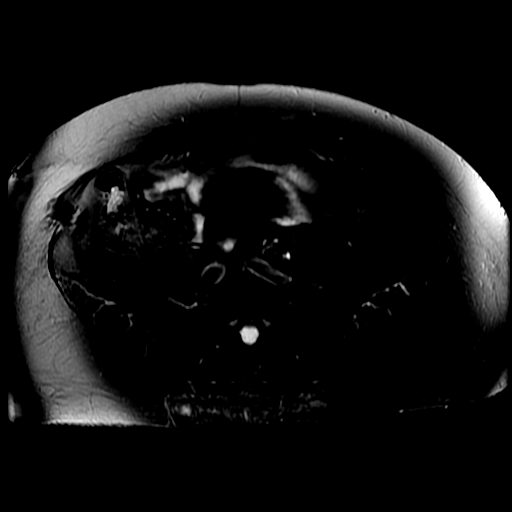
[im 27/53]
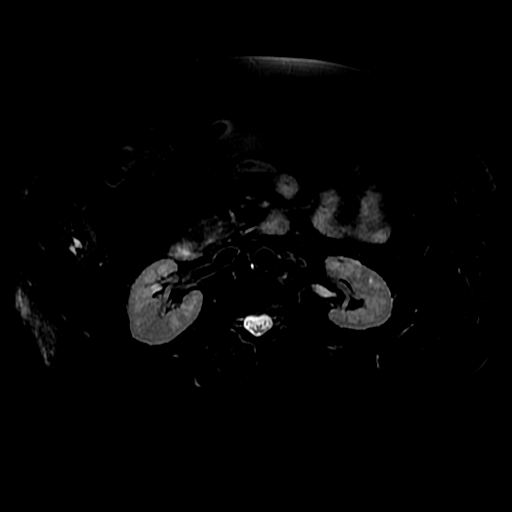
[im 53/53]
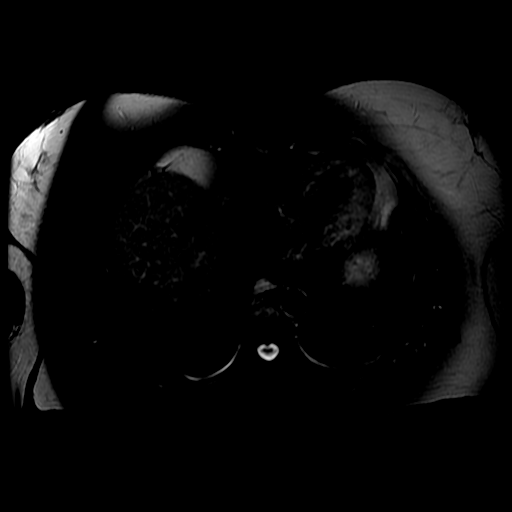

[Series 5: ax dualecho · axial · 5.0mm · 0.78mm/px · z∈[-136,+124]mm · 3 of 106 slices shown]
[im 1/106]
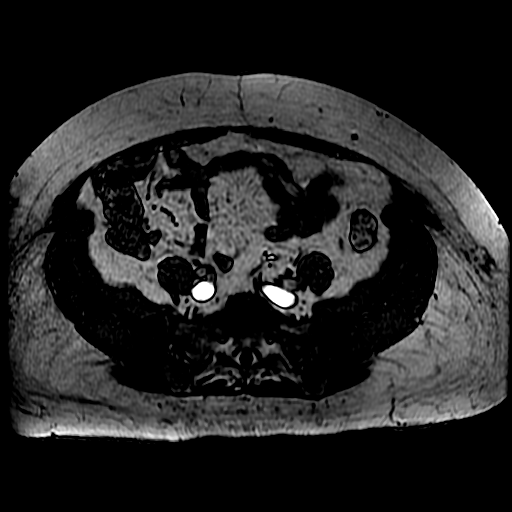
[im 53/106]
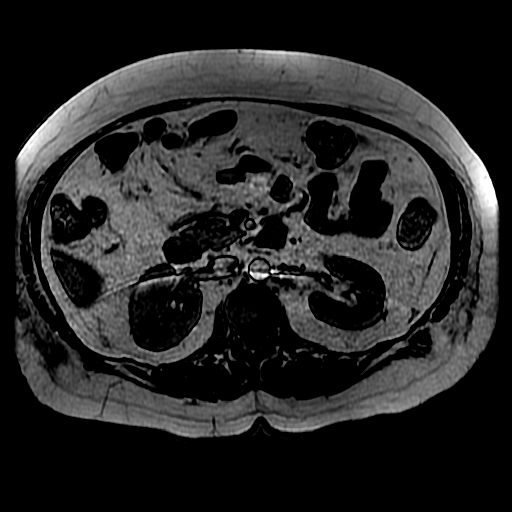
[im 106/106]
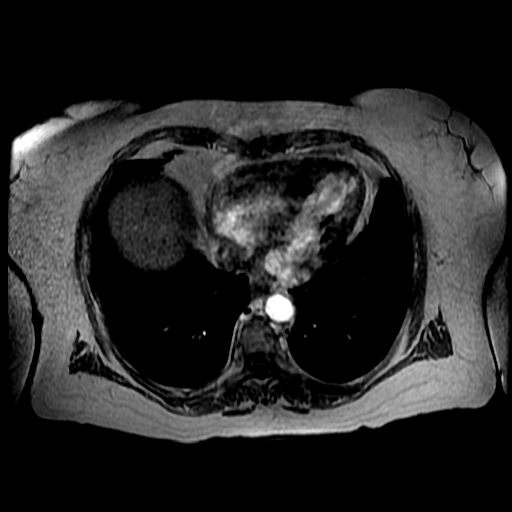

[Series 6: T2 · axial · 5.0mm · 0.78mm/px · z∈[-136,+124]mm · 2 of 53 slices shown (1 of 2)]
[im 1/53]
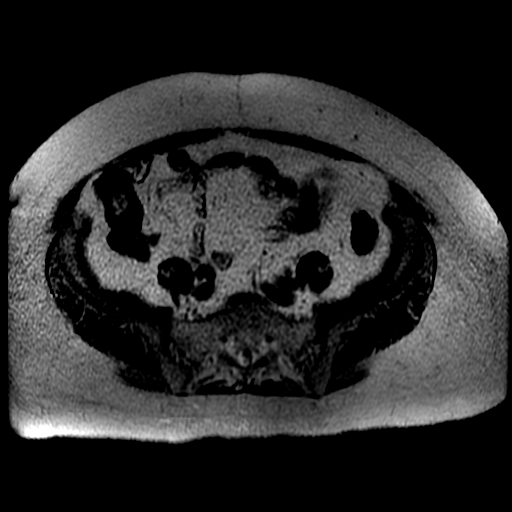
[im 53/53]
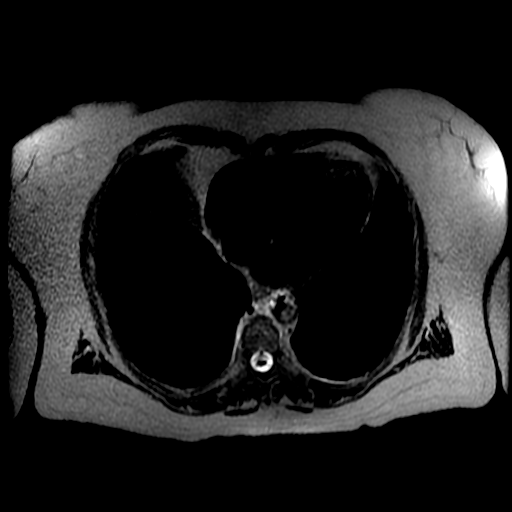

[Series 7: T2 · coronal · 5.0mm · 0.78mm/px · 1 of 41 slices shown (2 of 2)]
[im 1/41]
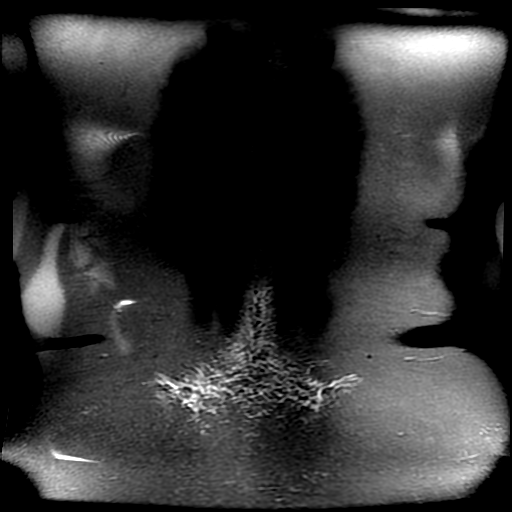

[Series 8: bSSFP · axial · 5.0mm · 0.78mm/px · z∈[-136,+124]mm · 2 of 53 slices shown]
[im 1/53]
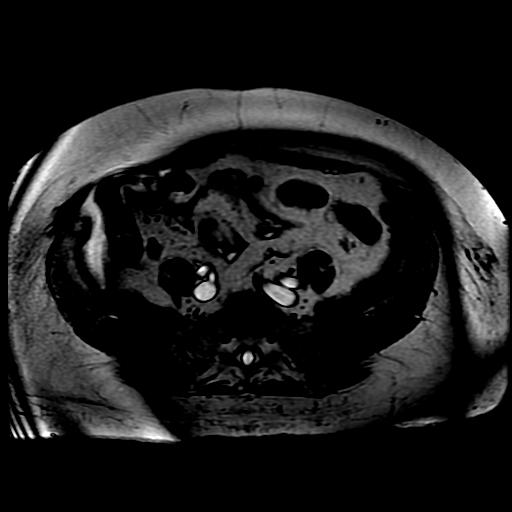
[im 53/53]
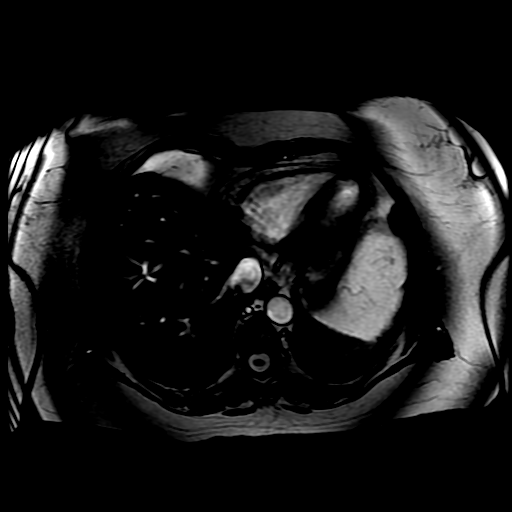

[Series 300: DWI · axial · 6.0mm · 1.48mm/px · 1 of 36 slices shown]
[im 1/36]
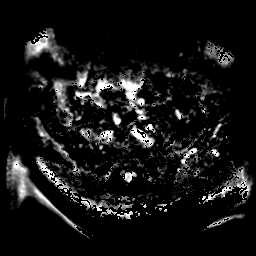

[Series 900: T1 dynamic · axial · 5.0mm · 0.78mm/px · z∈[-136,+101]mm · 3 of 96 slices shown (1 of 2)]
[im 1/96]
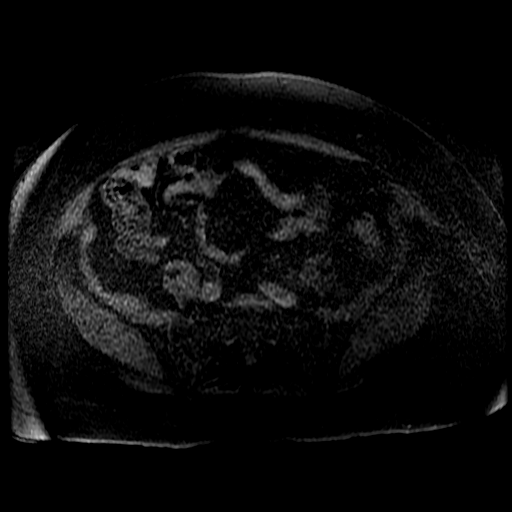
[im 48/96]
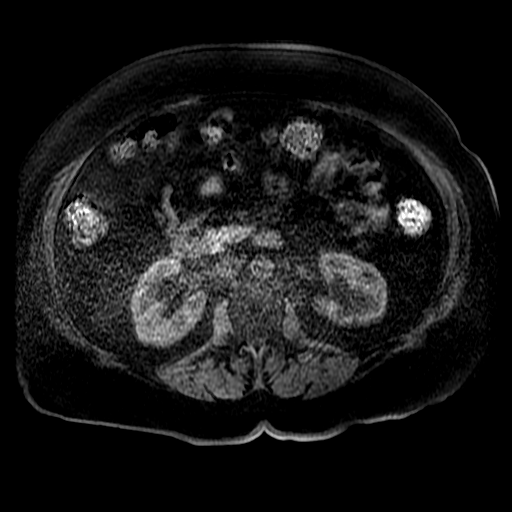
[im 96/96]
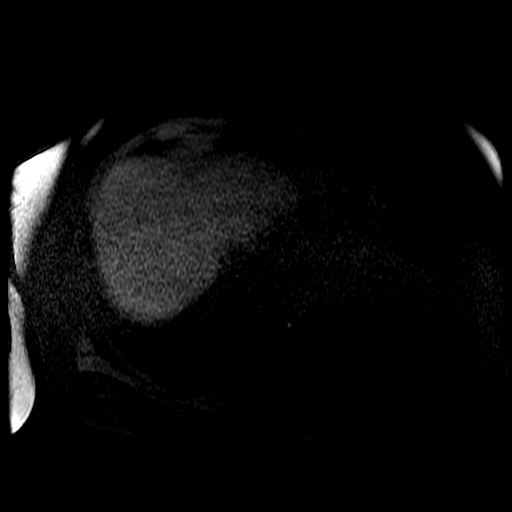

[Series 901: T1 dynamic · axial · 5.0mm · 0.78mm/px · z∈[-136,+101]mm · 3 of 96 slices shown (2 of 2)]
[im 1/96]
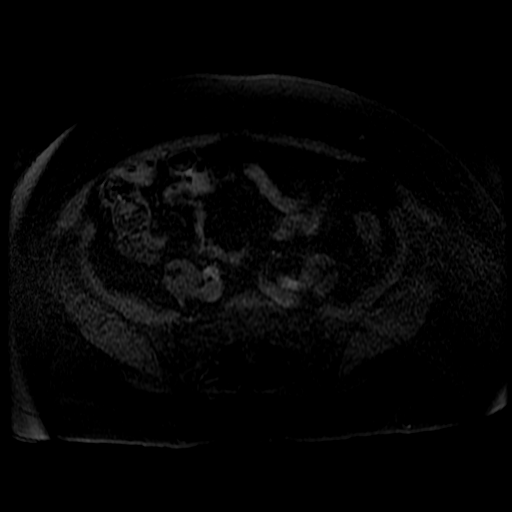
[im 48/96]
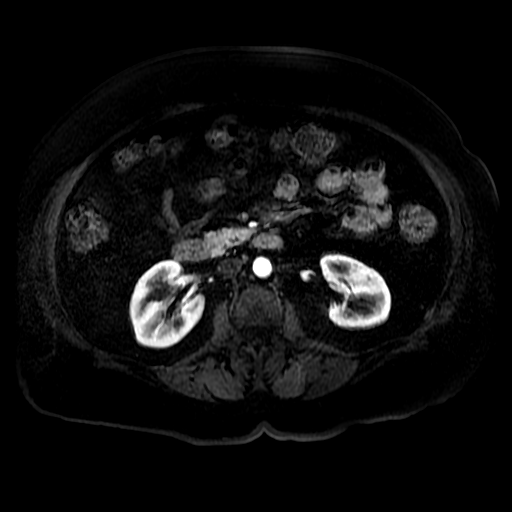
[im 96/96]
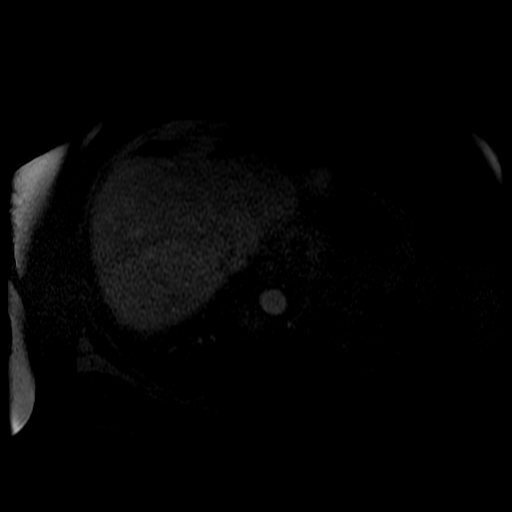

[21 of 48 positions shown; findings below may reference images not displayed]

FINDINGS: Lower chest: Unremarkable.

Hepatobiliary: There is diffuse loss of of signal intensity
throughout the hepatic parenchyma on out of phase dual echo images,
compatible with a background of hepatic steatosis. A few scattered
subcentimeter T1 hypointense, T2 hyperintense, nonenhancing lesions
are noted in the right lobe of the liver, compatible with small
cysts. No other suspicious appearing hepatic lesions are noted. No
intra or extrahepatic biliary ductal dilatation. Gallbladder is
normal in appearance.

Pancreas: No pancreatic mass. No pancreatic ductal dilatation. No
pancreatic or peripancreatic fluid or inflammatory changes.

Spleen:  Unremarkable.

Adrenals/Urinary Tract: In the lower pole of the right kidney there
is an exophytic 6.5 x 5.7 x 5.6 cm lesion which is low T1 signal
intensity, high T2 signal intensity, contains multiple thin internal
septations, but does not enhance on post gadolinium imaging,
compatible with a complex cyst. Two subcentimeter lesions in the
left kidney demonstrate low T1 signal intensity, high T2 signal
intensity, and do not enhance, compatible with tiny simple cysts. No
suspicious renal lesions are identified. No hydroureteronephrosis in
the visualized portions of the abdomen.

Stomach/Bowel: Visualized portions are unremarkable.

Vascular/Lymphatic: No aneurysm identified in the visualized
abdominal vasculature. No lymphadenopathy noted in the abdomen.

Other: No significant volume of ascites noted in the visualized
portions of the peritoneal cavity.

Musculoskeletal: No aggressive osseous lesions are noted in the
visualized portions of the skeleton.
IMPRESSION: 1. The lesion of concern in the lower pole the right kidney has
imaging characteristics compatible with a mildly complex cyst
(Bosniak class 2).
2. Mild hepatic steatosis.
3. Additional incidental findings, as above.

## 2017-03-29 ENCOUNTER — Ambulatory Visit: Payer: PPO | Admitting: Family Medicine

## 2017-03-30 NOTE — Progress Notes (Deleted)
Office Visit Note  Patient: Ruth [name]             Date of Birth: 04-10-1940           MRN: [id number]             PCP: Kathyrn Drown, MD Referring: Kathyrn Drown, MD Visit Date: 04/13/2017 Occupation: @GUAROCC @    Subjective:  No chief complaint on file.   History of Present Illness: Ruth [name] is a 77 y.o. female ***   Activities of Daily Living:  Patient reports morning stiffness for *** {minute/hour:19697}.   Patient {ACTIONS;DENIES/REPORTS:21021675::"Denies"} nocturnal pain.  Difficulty dressing/grooming: {ACTIONS;DENIES/REPORTS:21021675::"Denies"} Difficulty climbing stairs: {ACTIONS;DENIES/REPORTS:21021675::"Denies"} Difficulty getting out of chair: {ACTIONS;DENIES/REPORTS:21021675::"Denies"} Difficulty using hands for taps, buttons, cutlery, and/or writing: {ACTIONS;DENIES/REPORTS:21021675::"Denies"}   No Rheumatology ROS completed.   PMFS History:  Patient Active Problem List   Diagnosis Date Noted  . Family history of early CAD 04/15/2016  . Primary osteoarthritis of both knees 12/31/2015  . Chest pain 07/13/2015  . Atypical chest pain 07/13/2015  . Major depression 03/19/2015  . Vulvodynia 08/03/2014  . Functional constipation 08/03/2014  . Anxiety 07/06/2014  . Fibromyalgia 04/25/2014  . Snoring 04/22/2014  . Prediabetes 03/18/2014  . Hyperlipidemia 03/18/2014  . Osteopenia 03/06/2014  . History of cardiac catheterization 04/28/2013  . Dyspepsia 02/02/2012  . GERD (gastroesophageal reflux disease) 02/02/2012  . Depression 06/18/2011  . Dysphagia 02/05/2011  . CHEST PAIN 02/11/2009  . GASTROESOPHAGEAL REFLUX DISEASE, CHRONIC 02/15/2007  . IRRITABLE BOWEL SYNDROME 02/15/2007  . Degenerative joint disease 02/15/2007  . Reflux esophagitis 02/14/2007  . GASTRITIS 02/14/2007    Past Medical History:  Diagnosis Date  . Anxiety   . Arthritis   . Depression   . Fibromyalgia   . GERD (gastroesophageal reflux disease)    EGD/ colon 1/09    . H pylori ulcer 1980-1990   s/p treatment  . Headache   . Hyperlipidemia   . IBS (irritable bowel syndrome)   . Neck pain   . OSA (obstructive sleep apnea)   . Refusal of blood transfusions as patient is Jehovah's Witness   . Vertigo     Family History  Problem Relation Age of Onset  . Diabetes Father   . Heart attack Father        42's  . Lung cancer Father   . Cirrhosis Father 75       etoh cirrhosis  . Lung cancer Sister   . Depression Sister   . Alzheimer's disease Sister   . Hypertension Sister   . Diabetes Sister   . Lung cancer Sister   . Paranoid behavior Mother        depression  . Colon polyps Mother    Past Surgical History:  Procedure Laterality Date  . ABDOMINAL HYSTERECTOMY  1970  . APPENDECTOMY    . BILATERAL SALPINGOOPHORECTOMY  1990s  . CARDIAC CATHETERIZATION  2002   [facility]) normal coronary arteries   . COLONOSCOPY  01/2007   Dr. Meriel Flavors  . COLONOSCOPY N/A 04/18/2015   Procedure: COLONOSCOPY;  Surgeon: Rogene Houston, MD;  [facility];  Service: Endoscopy;  Laterality: N/A;  830 - moved to 8:55 - Ann to notify pt  . COLONOSCOPY WITH ESOPHAGOGASTRODUODENOSCOPY (EGD)  02/16/2012   Procedure: COLONOSCOPY WITH ESOPHAGOGASTRODUODENOSCOPY (EGD);  Surgeon: Daneil Dolin, MD;  Location: [facility];  Service: Endoscopy;  Laterality: N/A;  8:45  . ESOPHAGEAL DILATION N/A 06/07/2014   Procedure: ESOPHAGEAL DILATION;  Surgeon: Rogene Houston, MD;  Location: [facility];  Service: Endoscopy;  Laterality: N/A;  . ESOPHAGOGASTRODUODENOSCOPY  01/2007   Dr Sharlett Iles- 3 cm hiatal hernia, benign esophageal biopsies, erosive esophagitis, gastritis  . ESOPHAGOGASTRODUODENOSCOPY N/A 06/07/2014   Procedure: ESOPHAGOGASTRODUODENOSCOPY (EGD);  Surgeon: Rogene Houston, MD;  [facility];  Service: Endoscopy;  Laterality: N/A;  1200  . FOOT SURGERY    . INNER EAR SURGERY     Left  . KNEE SURGERY     Multiple  . SHOULDER SURGERY  2011   . TONSILLECTOMY    . TUBAL LIGATION     Social History   Social History Narrative   Married   2 grown children, 1 adopted son-autistic-lives next door     Objective: Vital Signs: There were no vitals taken for this visit.   Physical Exam   Musculoskeletal Exam: ***  CDAI Exam: No CDAI exam completed.    Investigation: No additional findings. CBC Latest Ref Rng & Units 12/30/2016 12/30/2016 08/25/2016  WBC 4.0 - 10.5 K/uL 7.3 5.1 4.3  Hemoglobin 12.0 - 15.0 g/dL 12.5 12.7 12.8  Hematocrit 36.0 - 46.0 % 38.2 38.1 38.2  Platelets 150 - 400 K/uL 245 223 257   CMP Latest Ref Rng & Units 12/30/2016 12/30/2016 08/25/2016  Glucose 65 - 99 mg/dL - 111(H) -  BUN 6 - 20 mg/dL - 19 -  Creatinine 0.44 - 1.00 mg/dL 0.67 0.71 -  Sodium 135 - 145 mmol/L - 137 -  Potassium 3.5 - 5.1 mmol/L - 3.9 -  Chloride 101 - 111 mmol/L - 104 -  CO2 22 - 32 mmol/L - 24 -  Calcium 8.9 - 10.3 mg/dL - 9.1 -  Total Protein 6.0 - 8.5 g/dL - - 6.8  Total Bilirubin 0.0 - 1.2 mg/dL - - 0.3  Alkaline Phos 39 - 117 IU/L - - 72  AST 0 - 40 IU/L - - 18  ALT 0 - 32 IU/L - - 16    Imaging: No results found.  Speciality Comments: No specialty comments available.    Procedures:  No procedures performed Allergies: Blood-group specific substance; Ciprofloxacin; Codeine; Vicodin [hydrocodone-acetaminophen]; and Tramadol   Assessment / Plan:     Visit Diagnoses: No diagnosis found.    Orders: No orders of the defined types were placed in this encounter.  No orders of the defined types were placed in this encounter.   Face-to-face time spent with patient was *** minutes. 50% of time was spent in counseling and coordination of care.  Follow-Up Instructions: No Follow-up on file.   Earnestine Mealing, CMA  Note - This record has been created using Editor, commissioning.  Chart creation errors have been sought, but may not always  have been located. Such creation errors do not reflect on  the standard  of medical care.

## 2017-04-06 DIAGNOSIS — H903 Sensorineural hearing loss, bilateral: Secondary | ICD-10-CM | POA: Diagnosis not present

## 2017-04-06 DIAGNOSIS — H6983 Other specified disorders of Eustachian tube, bilateral: Secondary | ICD-10-CM | POA: Diagnosis not present

## 2017-04-06 DIAGNOSIS — H7202 Central perforation of tympanic membrane, left ear: Secondary | ICD-10-CM | POA: Diagnosis not present

## 2017-04-08 DIAGNOSIS — M25561 Pain in right knee: Secondary | ICD-10-CM | POA: Diagnosis not present

## 2017-04-08 DIAGNOSIS — M25562 Pain in left knee: Secondary | ICD-10-CM | POA: Diagnosis not present

## 2017-04-13 ENCOUNTER — Ambulatory Visit: Payer: PPO | Admitting: Rheumatology

## 2017-04-19 ENCOUNTER — Encounter: Payer: Self-pay | Admitting: Family Medicine

## 2017-04-19 ENCOUNTER — Telehealth: Payer: Self-pay | Admitting: Family Medicine

## 2017-04-19 NOTE — Progress Notes (Deleted)
Office Visit Note  Patient: Ruth [name]             Date of Birth: 12-12-1940           MRN: [id number]             PCP: Kathyrn Drown, MD Referring: Kathyrn Drown, MD Visit Date: 04/20/2017 Occupation: @GUAROCC @    Subjective:  No chief complaint on file.   History of Present Illness: Ruth [name] is a 77 y.o. female ***   Activities of Daily Living:  Patient reports morning stiffness for *** {minute/hour:19697}.   Patient {ACTIONS;DENIES/REPORTS:21021675::"Denies"} nocturnal pain.  Difficulty dressing/grooming: {ACTIONS;DENIES/REPORTS:21021675::"Denies"} Difficulty climbing stairs: {ACTIONS;DENIES/REPORTS:21021675::"Denies"} Difficulty getting out of chair: {ACTIONS;DENIES/REPORTS:21021675::"Denies"} Difficulty using hands for taps, buttons, cutlery, and/or writing: {ACTIONS;DENIES/REPORTS:21021675::"Denies"}   No Rheumatology ROS completed.   PMFS History:  Patient Active Problem List   Diagnosis Date Noted  . Family history of early CAD 04/15/2016  . Primary osteoarthritis of both knees 12/31/2015  . Chest pain 07/13/2015  . Atypical chest pain 07/13/2015  . Major depression 03/19/2015  . Vulvodynia 08/03/2014  . Functional constipation 08/03/2014  . Anxiety 07/06/2014  . Fibromyalgia 04/25/2014  . Snoring 04/22/2014  . Prediabetes 03/18/2014  . Hyperlipidemia 03/18/2014  . Osteopenia 03/06/2014  . History of cardiac catheterization 04/28/2013  . Dyspepsia 02/02/2012  . GERD (gastroesophageal reflux disease) 02/02/2012  . Depression 06/18/2011  . Dysphagia 02/05/2011  . CHEST PAIN 02/11/2009  . GASTROESOPHAGEAL REFLUX DISEASE, CHRONIC 02/15/2007  . IRRITABLE BOWEL SYNDROME 02/15/2007  . Degenerative joint disease 02/15/2007  . Reflux esophagitis 02/14/2007  . GASTRITIS 02/14/2007    Past Medical History:  Diagnosis Date  . Anxiety   . Arthritis   . Depression   . Fibromyalgia   . GERD (gastroesophageal reflux disease)    EGD/ colon 1/09    . H pylori ulcer 1980-1990   s/p treatment  . Headache   . Hyperlipidemia   . IBS (irritable bowel syndrome)   . Neck pain   . OSA (obstructive sleep apnea)   . Refusal of blood transfusions as patient is Jehovah's Witness   . Vertigo     Family History  Problem Relation Age of Onset  . Diabetes Father   . Heart attack Father        26's  . Lung cancer Father   . Cirrhosis Father 39       etoh cirrhosis  . Lung cancer Sister   . Depression Sister   . Alzheimer's disease Sister   . Hypertension Sister   . Diabetes Sister   . Lung cancer Sister   . Paranoid behavior Mother        depression  . Colon polyps Mother    Past Surgical History:  Procedure Laterality Date  . ABDOMINAL HYSTERECTOMY  1970  . APPENDECTOMY    . BILATERAL SALPINGOOPHORECTOMY  1990s  . CARDIAC CATHETERIZATION  2002   [facility]) normal coronary arteries   . COLONOSCOPY  01/2007   Dr. Meriel Flavors  . COLONOSCOPY N/A 04/18/2015   Procedure: COLONOSCOPY;  Surgeon: Rogene Houston, MD;  [facility];  Service: Endoscopy;  Laterality: N/A;  830 - moved to 8:55 - Ann to notify pt  . COLONOSCOPY WITH ESOPHAGOGASTRODUODENOSCOPY (EGD)  02/16/2012   Procedure: COLONOSCOPY WITH ESOPHAGOGASTRODUODENOSCOPY (EGD);  Surgeon: Daneil Dolin, MD;  [facility];  Service: Endoscopy;  Laterality: N/A;  8:45  . ESOPHAGEAL DILATION N/A 06/07/2014   Procedure: ESOPHAGEAL DILATION;  Surgeon: Rogene Houston, MD;  [facility];  Service: Endoscopy;  Laterality: N/A;  . ESOPHAGOGASTRODUODENOSCOPY  01/2007   Dr Sharlett Iles- 3 cm hiatal hernia, benign esophageal biopsies, erosive esophagitis, gastritis  . ESOPHAGOGASTRODUODENOSCOPY N/A 06/07/2014   Procedure: ESOPHAGOGASTRODUODENOSCOPY (EGD);  Surgeon: Rogene Houston, MD;  [facility];  Service: Endoscopy;  Laterality: N/A;  1200  . FOOT SURGERY    . INNER EAR SURGERY     Left  . KNEE SURGERY     Multiple  . SHOULDER SURGERY  2011   . TONSILLECTOMY    . TUBAL LIGATION     Social History   Social History Narrative   Married   2 grown children, 1 adopted son-autistic-lives next door     Objective: Vital Signs: There were no vitals taken for this visit.   Physical Exam   Musculoskeletal Exam: ***  CDAI Exam: No CDAI exam completed.    Investigation: No additional findings. CBC Latest Ref Rng & Units 12/30/2016 12/30/2016 08/25/2016  WBC 4.0 - 10.5 K/uL 7.3 5.1 4.3  Hemoglobin 12.0 - 15.0 g/dL 12.5 12.7 12.8  Hematocrit 36.0 - 46.0 % 38.2 38.1 38.2  Platelets 150 - 400 K/uL 245 223 257   CMP Latest Ref Rng & Units 12/30/2016 12/30/2016 08/25/2016  Glucose 65 - 99 mg/dL - 111(H) -  BUN 6 - 20 mg/dL - 19 -  Creatinine 0.44 - 1.00 mg/dL 0.67 0.71 -  Sodium 135 - 145 mmol/L - 137 -  Potassium 3.5 - 5.1 mmol/L - 3.9 -  Chloride 101 - 111 mmol/L - 104 -  CO2 22 - 32 mmol/L - 24 -  Calcium 8.9 - 10.3 mg/dL - 9.1 -  Total Protein 6.0 - 8.5 g/dL - - 6.8  Total Bilirubin 0.0 - 1.2 mg/dL - - 0.3  Alkaline Phos 39 - 117 IU/L - - 72  AST 0 - 40 IU/L - - 18  ALT 0 - 32 IU/L - - 16    Imaging: No results found.  Speciality Comments: No specialty comments available.    Procedures:  No procedures performed Allergies: Blood-group specific substance; Ciprofloxacin; Codeine; Vicodin [hydrocodone-acetaminophen]; and Tramadol   Assessment / Plan:     Visit Diagnoses: No diagnosis found.    Orders: No orders of the defined types were placed in this encounter.  No orders of the defined types were placed in this encounter.   Face-to-face time spent with patient was *** minutes. 50% of time was spent in counseling and coordination of care.  Follow-Up Instructions: No Follow-up on file.   Earnestine Mealing, CMA  Note - This record has been created using Editor, commissioning.  Chart creation errors have been sought, but may not always  have been located. Such creation errors do not reflect on  the standard  of medical care.

## 2017-04-19 NOTE — Telephone Encounter (Signed)
Erica-the patient in a letter that she would like sent on to [employer] [place], [place],[id number].  I also did a cover letter that needs to be sent with this letter.  Please make sure that the patient's letter is scanned into epic.  Please make sure to either call the patient or send her a note letting her know that we did send him this letter to the state drug registry so hopefully they will correct the underlying problem that she is aware of.  Thank you

## 2017-04-20 ENCOUNTER — Ambulatory Visit: Payer: PPO | Admitting: Rheumatology

## 2017-04-21 NOTE — Progress Notes (Signed)
Office Visit Note  Patient: Ruth [name]             Date of Birth: [birth year]           MRN: [id number]             PCP: Kathyrn Drown, MD Referring: Kathyrn Drown, MD Visit Date: 04/29/2017 Occupation: @GUAROCC @    Subjective:  Pain of the Right Knee; Pain of the Left Knee; and Medication Management   History of Present Illness: Ruth [name] is a 77 y.o. female with history of fibromyalgia and osteoarthritis.  She states she continues to have a lot of pain and discomfort in her bilateral knee joints.  She had recently seen Dr. Alvan Dame for osteoarthritis of her knee joints who recommended total knee replacement.  She is not mentally prepared to have surgery at this point.  She had cortisone injection to bilateral knee joints in October 2018 by Dr.Olin. She was also seen by Dr. Doran Durand for osteoarthritis in her feet which continued to give her discomfort.  With the combination of Prozac and some natural herbs her symptoms of fibromyalgia fairly well controlled.   Activities of Daily Living:  Patient reports morning stiffness for 10 minutes.   Patient Reports nocturnal pain.  Difficulty dressing/grooming: Denies Difficulty climbing stairs: Reports Difficulty getting out of chair: Reports Difficulty using hands for taps, buttons, cutlery, and/or writing: Denies   Review of Systems  Constitutional: Positive for fatigue. Negative for night sweats, weight gain, weight loss and weakness.  HENT: Negative for mouth sores, trouble swallowing, trouble swallowing, mouth dryness and nose dryness.   Eyes: Negative for pain, redness, visual disturbance and dryness.  Respiratory: Negative for cough, shortness of breath and difficulty breathing.   Cardiovascular: Positive for palpitations. Negative for chest pain, hypertension, irregular heartbeat and swelling in legs/feet.  Gastrointestinal: Negative for blood in stool, constipation and diarrhea.  Endocrine: Negative for increased  urination.  Genitourinary: Negative for vaginal dryness.  Musculoskeletal: Positive for arthralgias, joint pain, myalgias, morning stiffness and myalgias. Negative for joint swelling, muscle weakness and muscle tenderness.  Skin: Negative for color change, rash, hair loss, skin tightness, ulcers and sensitivity to sunlight.  Allergic/Immunologic: Negative for susceptible to infections.  Neurological: Negative for dizziness, memory loss and night sweats.  Hematological: Negative for swollen glands.  Psychiatric/Behavioral: Positive for depressed mood and sleep disturbance. The patient is nervous/anxious.     PMFS History:  Patient Active Problem List   Diagnosis Date Noted  . Primary osteoarthritis of both feet 04/29/2017  . Family history of early CAD 04/15/2016  . Primary osteoarthritis of both knees 12/31/2015  . Chest pain 07/13/2015  . Atypical chest pain 07/13/2015  . Major depression 03/19/2015  . Vulvodynia 08/03/2014  . Functional constipation 08/03/2014  . Anxiety 07/06/2014  . Fibromyalgia 04/25/2014  . Snoring 04/22/2014  . Prediabetes 03/18/2014  . Hyperlipidemia 03/18/2014  . Osteopenia 03/06/2014  . History of cardiac catheterization 04/28/2013  . Dyspepsia 02/02/2012  . GERD (gastroesophageal reflux disease) 02/02/2012  . Depression 06/18/2011  . Dysphagia 02/05/2011  . CHEST PAIN 02/11/2009  . GASTROESOPHAGEAL REFLUX DISEASE, CHRONIC 02/15/2007  . IRRITABLE BOWEL SYNDROME 02/15/2007  . Degenerative joint disease 02/15/2007  . Reflux esophagitis 02/14/2007  . GASTRITIS 02/14/2007    Past Medical History:  Diagnosis Date  . Anxiety   . Arthritis   . Depression   . Fibromyalgia   . GERD (gastroesophageal reflux disease)    EGD/ colon 1/09  .  H pylori ulcer 1980-1990   s/p treatment  . Headache   . Hyperlipidemia   . IBS (irritable bowel syndrome)   . Neck pain   . OSA (obstructive sleep apnea)   . Refusal of blood transfusions as patient is Jehovah's  Witness   . Vertigo     Family History  Problem Relation Age of Onset  . Diabetes Father   . Heart attack Father        75's  . Lung cancer Father   . Cirrhosis Father 48       etoh cirrhosis  . Lung cancer Sister   . Depression Sister   . Alzheimer's disease Sister   . Hypertension Sister   . Diabetes Sister   . Lung cancer Sister   . Paranoid behavior Mother        depression  . Colon polyps Mother    Past Surgical History:  Procedure Laterality Date  . ABDOMINAL HYSTERECTOMY  1970  . APPENDECTOMY    . BILATERAL SALPINGOOPHORECTOMY  1990s  . CARDIAC CATHETERIZATION  2002   [facility]) normal coronary arteries   . COLONOSCOPY  01/2007   Dr. Meriel Flavors  . COLONOSCOPY N/A 04/18/2015   Procedure: COLONOSCOPY;  Surgeon: Rogene Houston, MD;  [facility];  Service: Endoscopy;  Laterality: N/A;  830 - moved to 8:55 - Ann to notify pt  . COLONOSCOPY WITH ESOPHAGOGASTRODUODENOSCOPY (EGD)  02/16/2012   Procedure: COLONOSCOPY WITH ESOPHAGOGASTRODUODENOSCOPY (EGD);  Surgeon: Daneil Dolin, MD;  [facility];  Service: Endoscopy;  Laterality: N/A;  8:45  . ESOPHAGEAL DILATION N/A 06/07/2014   Procedure: ESOPHAGEAL DILATION;  Surgeon: Rogene Houston, MD;  [facility];  Service: Endoscopy;  Laterality: N/A;  . ESOPHAGOGASTRODUODENOSCOPY  01/2007   Dr Sharlett Iles- 3 cm hiatal hernia, benign esophageal biopsies, erosive esophagitis, gastritis  . ESOPHAGOGASTRODUODENOSCOPY N/A 06/07/2014   Procedure: ESOPHAGOGASTRODUODENOSCOPY (EGD);  Surgeon: Rogene Houston, MD;  [facility];  Service: Endoscopy;  Laterality: N/A;  1200  . FOOT SURGERY    . INNER EAR SURGERY     Left  . KNEE SURGERY     Multiple  . SHOULDER SURGERY  2011  . TONSILLECTOMY    . TUBAL LIGATION     Social History   Social History Narrative   Married   2 grown children, 1 adopted son-autistic-lives next door     Objective: Vital Signs: BP (!) 143/65 (BP Location: Left  Arm, Patient Position: Sitting, Cuff Size: Large)   Pulse 81   Resp 12   Wt 203 lb (92.1 kg)   BMI 34.84 kg/m    Physical Exam  Constitutional: She is oriented to person, place, and time. She appears well-developed and well-nourished.  HENT:  Head: Normocephalic and atraumatic.  Eyes: Conjunctivae and EOM are normal.  Neck: Normal range of motion.  Cardiovascular: Normal rate, regular rhythm, normal heart sounds and intact distal pulses.  Pulmonary/Chest: Effort normal and breath sounds normal.  Abdominal: Soft. Bowel sounds are normal.  Lymphadenopathy:    She has no cervical adenopathy.  Neurological: She is alert and oriented to person, place, and time.  Skin: Skin is warm and dry. Capillary refill takes less than 2 seconds.  Psychiatric: She has a normal mood and affect. Her behavior is normal.  Nursing note and vitals reviewed.    Musculoskeletal Exam: C-spine thoracic lumbar spine good range of motion.  Shoulder joints elbow joints with good range of motion.  She  has DIP PIP thickening in her hands and feet consistent with osteoarthritis.  Hip joints were in good range of motion.  She has warmth in bilateral knee joints and discomfort with range of motion of her bilateral knee joints.  CDAI Exam: No CDAI exam completed.    Investigation: No additional findings. CBC Latest Ref Rng & Units 12/30/2016 12/30/2016 08/25/2016  WBC 4.0 - 10.5 K/uL 7.3 5.1 4.3  Hemoglobin 12.0 - 15.0 g/dL 12.5 12.7 12.8  Hematocrit 36.0 - 46.0 % 38.2 38.1 38.2  Platelets 150 - 400 K/uL 245 223 257   CMP Latest Ref Rng & Units 12/30/2016 12/30/2016 08/25/2016  Glucose 65 - 99 mg/dL - 111(H) -  BUN 6 - 20 mg/dL - 19 -  Creatinine 0.44 - 1.00 mg/dL 0.67 0.71 -  Sodium 135 - 145 mmol/L - 137 -  Potassium 3.5 - 5.1 mmol/L - 3.9 -  Chloride 101 - 111 mmol/L - 104 -  CO2 22 - 32 mmol/L - 24 -  Calcium 8.9 - 10.3 mg/dL - 9.1 -  Total Protein 6.0 - 8.5 g/dL - - 6.8  Total Bilirubin 0.0 - 1.2 mg/dL  - - 0.3  Alkaline Phos 39 - 117 IU/L - - 72  AST 0 - 40 IU/L - - 18  ALT 0 - 32 IU/L - - 16    Imaging: No results found.  Speciality Comments: No specialty comments available.    Procedures:  No procedures performed Allergies: Blood-group specific substance; Ciprofloxacin; Codeine; Vicodin [hydrocodone-acetaminophen]; and Tramadol   Assessment / Plan:     Visit Diagnoses: Fibromyalgia: Her fibromyalgia is fairly well controlled with combination of natural herbs and Prozac.  She does have some generalized pain and discomfort but tolerable currently.  Primary osteoarthritis of both hands: Joint protection muscle strengthening was discussed.  Primary osteoarthritis of both knees: Patient states that she has severe osteoarthritis in her knee joints and has problems with mobility.  She has been seeing orthopedics for osteoarthritis of her knees.  She has been advised total knee replacement.  We had detailed discussion regarding future total knee replacement.  I advised her to use muscle strengthening exercises before surgery and she should try weight loss as well.  A handout on knee joint exercises was given.  Natural anti-inflammatories were also discussed.  She states she has taken cortisone injections and Visco supplement injections intermittently which helped only to some extent.  Primary osteoarthritis of both feet: She is seen Dr. Doran Durand for osteoarthritis in her feet.  She has been using proper fitting shoes.  Trochanteric bursitis of both hips: She is having only minimal discomfort.  Other fatigue: Secondary to insomnia.  Primary insomnia: She has chronic insomnia.  Good sleep hygiene was discussed.  Osteopenia of multiple sites: Use of calcium vitamin D and resistive exercises was discussed.   Orders: No orders of the defined types were placed in this encounter.  No orders of the defined types were placed in this encounter.   Face-to-face time spent with patient was 30  minutes.  Greater than 50% of time was spent in counseling and coordination of care.  Follow-Up Instructions: Return in about 1 year (around 04/30/2018) for Osteoarthritis, FMS.   Bo Merino, MD  Note - This record has been created using Editor, commissioning.  Chart creation errors have been sought, but may not always  have been located. Such creation errors do not reflect on  the standard of medical care.

## 2017-04-22 ENCOUNTER — Encounter: Payer: Self-pay | Admitting: Family Medicine

## 2017-04-22 ENCOUNTER — Ambulatory Visit (INDEPENDENT_AMBULATORY_CARE_PROVIDER_SITE_OTHER): Payer: PPO | Admitting: Family Medicine

## 2017-04-22 VITALS — BP 142/70 | Ht 64.0 in | Wt 215.0 lb

## 2017-04-22 DIAGNOSIS — E669 Obesity, unspecified: Secondary | ICD-10-CM

## 2017-04-22 DIAGNOSIS — Z0000 Encounter for general adult medical examination without abnormal findings: Secondary | ICD-10-CM

## 2017-04-22 DIAGNOSIS — M8589 Other specified disorders of bone density and structure, multiple sites: Secondary | ICD-10-CM

## 2017-04-22 DIAGNOSIS — R7303 Prediabetes: Secondary | ICD-10-CM | POA: Diagnosis not present

## 2017-04-22 DIAGNOSIS — Z780 Asymptomatic menopausal state: Secondary | ICD-10-CM

## 2017-04-22 DIAGNOSIS — E7801 Familial hypercholesterolemia: Secondary | ICD-10-CM

## 2017-04-22 NOTE — Progress Notes (Signed)
Subjective:    [name], female    DOB: 04/20/1940, 77 y.o.   MRN: [id number]  HPI AWV- Annual Wellness Visit  The patient was seen for their annual wellness visit. The patient's past medical history, surgical history, and family history were reviewed. Pertinent vaccines were reviewed ( tetanus, pneumonia, shingles, flu) The patient's medication list was reviewed and updated.  The height and weight were entered.  BMI recorded in electronic record elsewhere  Cognitive screening was completed. Outcome of Mini - Cog: pass   Falls /depression screening electronically recorded within record elsewhere  Current tobacco usage: none (All patients who use tobacco were given written and verbal information on quitting)  Recent listing of emergency department/hospitalizations over the past year were reviewed.  current specialist the patient sees on a regular basis: none   Medicare annual wellness visit patient questionnaire was reviewed.  A written screening schedule for the patient for the next 5-10 years was given. Appropriate discussion of followup regarding next visit was discussed.         Review of Systems  Constitutional: Negative for activity change, appetite change and fatigue.  HENT: Negative for congestion and rhinorrhea.   Eyes: Negative for discharge.  Respiratory: Negative for cough, chest tightness and wheezing.   Cardiovascular: Negative for chest pain.  Gastrointestinal: Negative for abdominal pain, blood in stool and vomiting.  Endocrine: Negative for polyphagia.  Genitourinary: Negative for difficulty urinating and frequency.  Musculoskeletal: Negative for neck pain.  Skin: Negative for color change.  Allergic/Immunologic: Negative for environmental allergies and food allergies.  Neurological: Negative for weakness and headaches.  Psychiatric/Behavioral: Negative for agitation and behavioral problems.       Objective:   Physical Exam    Constitutional: She is oriented to person, place, and time. She appears well-developed and well-nourished.  HENT:  Head: Normocephalic and atraumatic.  Right Ear: External ear normal.  Left Ear: External ear normal.  Eyes: Right eye exhibits no discharge. Left eye exhibits no discharge.  Neck: Normal range of motion. No tracheal deviation present.  Cardiovascular: Normal rate, regular rhythm, normal heart sounds and intact distal pulses. Exam reveals no gallop.  No murmur heard. Pulmonary/Chest: Effort normal and breath sounds normal. No stridor. No respiratory distress. She has no wheezes. She has no rales.  Abdominal: Soft. Bowel sounds are normal. She exhibits no distension and no mass. There is no tenderness. There is no rebound and no guarding.  Musculoskeletal: Normal range of motion. She exhibits no edema or tenderness.  Lymphadenopathy:    She has no cervical adenopathy.  Neurological: She is alert and oriented to person, place, and time. She exhibits normal muscle tone.  Skin: Skin is warm and dry.  Psychiatric: She has a normal mood and affect. Her behavior is normal.          Assessment & Plan:  1. Routine general medical examination at a health care facility Adult wellness-complete.wellness physical was conducted today. Importance of diet and exercise were discussed in detail. In addition to this a discussion regarding safety was also covered. We also reviewed over immunizations and gave recommendations regarding current immunization needed for age. I up-to-date on colonoscopy n addition to this additional areas were also touched on including: Preventative health exams needed: Colonoscopy up-to-date on colonoscopy  Patient was advised yearly wellness exam  - Lipid panel - Hepatic function panel - CBC with Differential/Platelet - Basic metabolic panel  2. Osteopenia of multiple sites Due for bone density -  DG Bone Density minimize starches avoid sugary drinks avoid  snack foods  3. Familial hypercholesterolemia Does not tolerate statins check lipid profile before next visit watch diet stay healthy - Lipid panel  4. Prediabetes watch diet minimize snack foods bone density as directed  5. Post-menopausal Bone density as directed - DG Bone Density  6. Obesity (BMI 30.0-34.9) The patient's BMI is calculated.  It is in the vital signs and acknowledged.  It is above the recommended BMI for the patient's height and weight.  The patient has been counseled regarding healthy diet, restricted portions, avoiding excessive carbohydrates/sugary foods, and increase physical activity as health permits.  It is in the patient's best interest to lower the risk of secondary illness including heart disease strokes and cancer by losing weight.  The patient acknowledges this information.  Patient defers on flu vaccine and pneumonia vaccine

## 2017-04-23 DIAGNOSIS — M79671 Pain in right foot: Secondary | ICD-10-CM | POA: Diagnosis not present

## 2017-04-23 DIAGNOSIS — M79672 Pain in left foot: Secondary | ICD-10-CM | POA: Diagnosis not present

## 2017-04-23 DIAGNOSIS — M2040 Other hammer toe(s) (acquired), unspecified foot: Secondary | ICD-10-CM | POA: Insufficient documentation

## 2017-04-23 DIAGNOSIS — M2042 Other hammer toe(s) (acquired), left foot: Secondary | ICD-10-CM | POA: Diagnosis not present

## 2017-04-23 DIAGNOSIS — Z4789 Encounter for other orthopedic aftercare: Secondary | ICD-10-CM | POA: Diagnosis not present

## 2017-04-29 ENCOUNTER — Ambulatory Visit: Payer: PPO | Admitting: Rheumatology

## 2017-04-29 ENCOUNTER — Encounter: Payer: Self-pay | Admitting: Physician Assistant

## 2017-04-29 VITALS — BP 143/65 | HR 81 | Resp 12 | Wt 203.0 lb

## 2017-04-29 DIAGNOSIS — M797 Fibromyalgia: Secondary | ICD-10-CM

## 2017-04-29 DIAGNOSIS — M19071 Primary osteoarthritis, right ankle and foot: Secondary | ICD-10-CM | POA: Insufficient documentation

## 2017-04-29 DIAGNOSIS — M7061 Trochanteric bursitis, right hip: Secondary | ICD-10-CM | POA: Diagnosis not present

## 2017-04-29 DIAGNOSIS — M19042 Primary osteoarthritis, left hand: Secondary | ICD-10-CM | POA: Diagnosis not present

## 2017-04-29 DIAGNOSIS — M19041 Primary osteoarthritis, right hand: Secondary | ICD-10-CM

## 2017-04-29 DIAGNOSIS — M7062 Trochanteric bursitis, left hip: Secondary | ICD-10-CM | POA: Diagnosis not present

## 2017-04-29 DIAGNOSIS — M170 Bilateral primary osteoarthritis of knee: Secondary | ICD-10-CM | POA: Diagnosis not present

## 2017-04-29 DIAGNOSIS — M8589 Other specified disorders of bone density and structure, multiple sites: Secondary | ICD-10-CM | POA: Diagnosis not present

## 2017-04-29 DIAGNOSIS — M19079 Primary osteoarthritis, unspecified ankle and foot: Secondary | ICD-10-CM | POA: Insufficient documentation

## 2017-04-29 DIAGNOSIS — F5101 Primary insomnia: Secondary | ICD-10-CM | POA: Diagnosis not present

## 2017-04-29 DIAGNOSIS — M19072 Primary osteoarthritis, left ankle and foot: Secondary | ICD-10-CM | POA: Diagnosis not present

## 2017-04-29 DIAGNOSIS — R5383 Other fatigue: Secondary | ICD-10-CM | POA: Diagnosis not present

## 2017-04-29 NOTE — Patient Instructions (Signed)
Natural anti-inflammatories  You can purchase these at [employer], [employer] or online.  . Turmeric (capsules)  . Ginger (ginger root or capsules)  . Omega 3 (Fish, flax seeds, chia seeds, walnuts, almonds)  . Tart cherry (dried or extract)   Patient should be under the care of a physician while taking these supplements. This may not be reproduced without the permission of Dr. Zadkiel Dragan.   Knee Exercises Ask your health care provider which exercises are safe for you. Do exercises exactly as told by your health care provider and adjust them as directed. It is normal to feel mild stretching, pulling, tightness, or discomfort as you do these exercises, but you should stop right away if you feel sudden pain or your pain gets worse.Do not begin these exercises until told by your health care provider. STRETCHING AND RANGE OF MOTION EXERCISES These exercises warm up your muscles and joints and improve the movement and flexibility of your knee. These exercises also help to relieve pain, numbness, and tingling. Exercise A: Knee Extension, Prone 1. Lie on your abdomen on a bed. 2. Place your left / right knee just beyond the edge of the surface so your knee is not on the bed. You can put a towel under your left / right thigh just above your knee for comfort. 3. Relax your leg muscles and allow gravity to straighten your knee. You should feel a stretch behind your left / right knee. 4. Hold this position for __________ seconds. 5. Scoot up so your knee is supported between repetitions. Repeat __________ times. Complete this stretch __________ times a day. Exercise B: Knee Flexion, Active  1. Lie on your back with both knees straight. If this causes back discomfort, bend your left / right knee so your foot is flat on the floor. 2. Slowly slide your left / right heel back toward your buttocks until you feel a gentle stretch in the front of your knee or thigh. 3. Hold this position  for __________ seconds. 4. Slowly slide your left / right heel back to the starting position. Repeat __________ times. Complete this exercise __________ times a day. Exercise C: Quadriceps, Prone  1. Lie on your abdomen on a firm surface, such as a bed or padded floor. 2. Bend your left / right knee and hold your ankle. If you cannot reach your ankle or pant leg, loop a belt around your foot and grab the belt instead. 3. Gently pull your heel toward your buttocks. Your knee should not slide out to the side. You should feel a stretch in the front of your thigh and knee. 4. Hold this position for __________ seconds. Repeat __________ times. Complete this stretch __________ times a day. Exercise D: Hamstring, Supine 1. Lie on your back. 2. Loop a belt or towel over the ball of your left / right foot. The ball of your foot is on the walking surface, right under your toes. 3. Straighten your left / right knee and slowly pull on the belt to raise your leg until you feel a gentle stretch behind your knee. ? Do not let your left / right knee bend while you do this. ? Keep your other leg flat on the floor. 4. Hold this position for __________ seconds. Repeat __________ times. Complete this stretch __________ times a day. STRENGTHENING EXERCISES These exercises build strength and endurance in your knee. Endurance is the ability to use your muscles for a long time, even after they get tired. Exercise   E: Quadriceps, Isometric  1. Lie on your back with your left / right leg extended and your other knee bent. Put a rolled towel or small pillow under your knee if told by your health care provider. 2. Slowly tense the muscles in the front of your left / right thigh. You should see your kneecap slide up toward your hip or see increased dimpling just above the knee. This motion will push the back of the knee toward the floor. 3. For __________ seconds, keep the muscle as tight as you can without increasing  your pain. 4. Relax the muscles slowly and completely. Repeat __________ times. Complete this exercise __________ times a day. Exercise F: Straight Leg Raises - Quadriceps 1. Lie on your back with your left / right leg extended and your other knee bent. 2. Tense the muscles in the front of your left / right thigh. You should see your kneecap slide up or see increased dimpling just above the knee. Your thigh may even shake a bit. 3. Keep these muscles tight as you raise your leg 4-6 inches (10-15 cm) off the floor. Do not let your knee bend. 4. Hold this position for __________ seconds. 5. Keep these muscles tense as you lower your leg. 6. Relax your muscles slowly and completely after each repetition. Repeat __________ times. Complete this exercise __________ times a day. Exercise G: Hamstring, Isometric 1. Lie on your back on a firm surface. 2. Bend your left / right knee approximately __________ degrees. 3. Dig your left / right heel into the surface as if you are trying to pull it toward your buttocks. Tighten the muscles in the back of your thighs to dig as hard as you can without increasing any pain. 4. Hold this position for __________ seconds. 5. Release the tension gradually and allow your muscles to relax completely for __________ seconds after each repetition. Repeat __________ times. Complete this exercise __________ times a day. Exercise H: Hamstring Curls  If told by your health care provider, do this exercise while wearing ankle weights. Begin with __________ weights. Then increase the weight by 1 lb (0.5 kg) increments. Do not wear ankle weights that are more than __________. 1. Lie on your abdomen with your legs straight. 2. Bend your left / right knee as far as you can without feeling pain. Keep your hips flat against the floor. 3. Hold this position for __________ seconds. 4. Slowly lower your leg to the starting position.  Repeat __________ times. Complete this exercise  __________ times a day. Exercise I: Squats (Quadriceps) 1. Stand in front of a table, with your feet and knees pointing straight ahead. You may rest your hands on the table for balance but not for support. 2. Slowly bend your knees and lower your hips like you are going to sit in a chair. ? Keep your weight over your heels, not over your toes. ? Keep your lower legs upright so they are parallel with the table legs. ? Do not let your hips go lower than your knees. ? Do not bend lower than told by your health care provider. ? If your knee pain increases, do not bend as low. 3. Hold the squat position for __________ seconds. 4. Slowly push with your legs to return to standing. Do not use your hands to pull yourself to standing. Repeat __________ times. Complete this exercise __________ times a day. Exercise J: Wall Slides (Quadriceps)  1. Lean your back against a smooth wall or door   while you walk your feet out 18-24 inches (46-61 cm) from it. 2. Place your feet hip-width apart. 3. Slowly slide down the wall or door until your knees bend __________ degrees. Keep your knees over your heels, not over your toes. Keep your knees in line with your hips. 4. Hold for __________ seconds. Repeat __________ times. Complete this exercise __________ times a day. Exercise K: Straight Leg Raises - Hip Abductors 1. Lie on your side with your left / right leg in the top position. Lie so your head, shoulder, knee, and hip line up. You may bend your bottom knee to help you keep your balance. 2. Roll your hips slightly forward so your hips are stacked directly over each other and your left / right knee is facing forward. 3. Leading with your heel, lift your top leg 4-6 inches (10-15 cm). You should feel the muscles in your outer hip lifting. ? Do not let your foot drift forward. ? Do not let your knee roll toward the ceiling. 4. Hold this position for __________ seconds. 5. Slowly return your leg to the starting  position. 6. Let your muscles relax completely after each repetition. Repeat __________ times. Complete this exercise __________ times a day. Exercise L: Straight Leg Raises - Hip Extensors 1. Lie on your abdomen on a firm surface. You can put a pillow under your hips if that is more comfortable. 2. Tense the muscles in your buttocks and lift your left / right leg about 4-6 inches (10-15 cm). Keep your knee straight as you lift your leg. 3. Hold this position for __________ seconds. 4. Slowly lower your leg to the starting position. 5. Let your leg relax completely after each repetition. Repeat __________ times. Complete this exercise __________ times a day. This information is not intended to replace advice given to you by your health care provider. Make sure you discuss any questions you have with your health care provider. Document Released: 12/17/2004 Document Revised: 10/28/2015 Document Reviewed: 12/09/2014 Elsevier Interactive Patient Education  2018 [employer]  

## 2017-05-05 ENCOUNTER — Other Ambulatory Visit (HOSPITAL_COMMUNITY): Payer: Self-pay

## 2017-05-06 DIAGNOSIS — M1711 Unilateral primary osteoarthritis, right knee: Secondary | ICD-10-CM | POA: Diagnosis not present

## 2017-05-06 DIAGNOSIS — M1712 Unilateral primary osteoarthritis, left knee: Secondary | ICD-10-CM | POA: Diagnosis not present

## 2017-05-06 DIAGNOSIS — M170 Bilateral primary osteoarthritis of knee: Secondary | ICD-10-CM | POA: Diagnosis not present

## 2017-05-07 ENCOUNTER — Encounter: Payer: Self-pay | Admitting: Family Medicine

## 2017-05-07 ENCOUNTER — Ambulatory Visit (INDEPENDENT_AMBULATORY_CARE_PROVIDER_SITE_OTHER): Payer: PPO | Admitting: Family Medicine

## 2017-05-07 VITALS — BP 140/74 | HR 78 | Temp 98.1°F | Ht 64.0 in | Wt 219.1 lb

## 2017-05-07 DIAGNOSIS — R079 Chest pain, unspecified: Secondary | ICD-10-CM

## 2017-05-07 MED ORDER — AMOXICILLIN 500 MG PO CAPS: 500.0000 mg | ORAL_CAPSULE | Freq: Three times a day (TID) | ORAL | 0 refills | Status: DC

## 2017-05-07 MED ORDER — ALBUTEROL SULFATE HFA 108 (90 BASE) MCG/ACT IN AERS: 2.0000 | INHALATION_SPRAY | Freq: Four times a day (QID) | RESPIRATORY_TRACT | 2 refills | Status: DC | PRN

## 2017-05-07 NOTE — Progress Notes (Signed)
   Subjective:    [name], female    DOB: 10-Aug-1940, 77 y.o.   MRN: [id number]  HPI Patient is here today with complaints of hurting under her left breast when she takes a breath. Patient states this pain started yesterday after noon.It does not bother her much except when she breaths in or coughs. She states she has had a dry cough for a couple of weeks now. States she has a hard time getting a breath, she just recently got a dog and thinks may be related to the shedding. She has not taken any medications for this problems.  has taken tyl and mucinex  Also using mucinex  At times breathing feels tight.  History of reactive airways.  2 weeks duration of cough.  Occurred after getting new poodle.  Wonders if there is an allergy component.  Some congestion drainage early on.  Cough occasionally productive.  Chest pain left-sided patient concerned about heart or other potential etiologies.  Sharp in nature.  Generally worse with a deep breath.   Review of Systems No headache, no major weight loss or weight gain, no chest pain no back pain abdominal pain no change in bowel habits complete ROS otherwise negative     Objective:   Physical Exam  Alert and oriented, vitals reviewed and stable, NAD ENT-TM's and ext canals WNL bilat via otoscopic exam Soft palate, tonsils and post pharynx WNL via oropharyngeal exam Neck-symmetric, no masses; thyroid nonpalpable and nontender Pulmonary-no tachypnea or accessory muscle use; Clear without wheezes via auscultation Card--no abnrml murmurs, rhythm reg and rate WNL Carotid pulses symmetric, without bruits Left anterolateral chest exam under breasts reveals patch of tenderness to deep palpation      Assessment & Plan:  Impression subacute bronchitis with chest wall inflammation.  Rationale discussed at great length.  Does not need major work-up or chest pain.  Multiple questions answered.  Trial of over-the-counter  anti-inflammatory plus antibiotics plus inhaler symptom care discussed  Greater than 50% of this 25 minute face to face visit was spent in counseling and discussion and coordination of care regarding the above diagnosis/diagnosies

## 2017-05-07 NOTE — Patient Instructions (Signed)
aleave two tabs twice per day for next five days or so

## 2017-05-11 ENCOUNTER — Other Ambulatory Visit: Payer: Self-pay | Admitting: Family Medicine

## 2017-05-11 DIAGNOSIS — Z1231 Encounter for screening mammogram for malignant neoplasm of breast: Secondary | ICD-10-CM

## 2017-05-12 DIAGNOSIS — J383 Other diseases of vocal cords: Secondary | ICD-10-CM | POA: Diagnosis not present

## 2017-05-13 DIAGNOSIS — M170 Bilateral primary osteoarthritis of knee: Secondary | ICD-10-CM | POA: Diagnosis not present

## 2017-05-13 DIAGNOSIS — M1711 Unilateral primary osteoarthritis, right knee: Secondary | ICD-10-CM | POA: Diagnosis not present

## 2017-05-13 DIAGNOSIS — M1712 Unilateral primary osteoarthritis, left knee: Secondary | ICD-10-CM | POA: Diagnosis not present

## 2017-05-17 ENCOUNTER — Ambulatory Visit: Payer: PPO | Admitting: Family Medicine

## 2017-05-17 ENCOUNTER — Ambulatory Visit (HOSPITAL_COMMUNITY): Admission: RE | Admit: 2017-05-17 | Payer: PPO | Source: Ambulatory Visit

## 2017-05-17 ENCOUNTER — Encounter (HOSPITAL_COMMUNITY): Payer: Self-pay

## 2017-05-17 ENCOUNTER — Ambulatory Visit (HOSPITAL_COMMUNITY)
Admission: RE | Admit: 2017-05-17 | Discharge: 2017-05-17 | Disposition: A | Discharge status: Home / Self Care | Payer: PPO | Source: Ambulatory Visit | Attending: Family Medicine | Admitting: Family Medicine

## 2017-05-17 DIAGNOSIS — Z1231 Encounter for screening mammogram for malignant neoplasm of breast: Secondary | ICD-10-CM

## 2017-05-20 DIAGNOSIS — M170 Bilateral primary osteoarthritis of knee: Secondary | ICD-10-CM | POA: Diagnosis not present

## 2017-06-07 ENCOUNTER — Telehealth: Payer: Self-pay | Admitting: Family Medicine

## 2017-06-07 DIAGNOSIS — M25562 Pain in left knee: Secondary | ICD-10-CM | POA: Diagnosis not present

## 2017-06-07 NOTE — Telephone Encounter (Signed)
ok 

## 2017-06-07 NOTE — Telephone Encounter (Signed)
Patients spouse, Herbie Baltimore, called regarding Rhodia having left knee pain.  She injured it last Thursday.  He wanted to just have an x-ray ordered.  I told him that we would be unable to order an x-ray without an appointment, but we would be glad to schedule her with Dr. Richardson Landry for tomorrow since the schedule is full today.  He declined because he would be busy tomorrow and she couldn't drive herself.  He said he would take her to the urgent care.

## 2017-06-11 DIAGNOSIS — H524 Presbyopia: Secondary | ICD-10-CM | POA: Diagnosis not present

## 2017-06-11 DIAGNOSIS — H2513 Age-related nuclear cataract, bilateral: Secondary | ICD-10-CM | POA: Diagnosis not present

## 2017-06-11 DIAGNOSIS — H5203 Hypermetropia, bilateral: Secondary | ICD-10-CM | POA: Diagnosis not present

## 2017-06-11 DIAGNOSIS — H52223 Regular astigmatism, bilateral: Secondary | ICD-10-CM | POA: Diagnosis not present

## 2017-06-17 ENCOUNTER — Telehealth: Payer: Self-pay | Admitting: Family Medicine

## 2017-06-17 NOTE — Telephone Encounter (Signed)
Patient called to check on this.  She wants to pick this up tomorrow.

## 2017-06-17 NOTE — Telephone Encounter (Signed)
Patient is requesting a refill on HYDROcodone-acetaminophen (NORCO/VICODIN) 5-325 MG tablet.  She said she is completely out.

## 2017-06-17 NOTE — Telephone Encounter (Signed)
She may have 30 tablets, 5 mg / 325 mg, 1 every 4 hours as needed severe pain use sparingly.  Because this is a controlled medicine we need a office visit specifically for pain management control on a every 101-month basis for this type of medication so therefore next prescription will need office visit

## 2017-06-18 DIAGNOSIS — M1711 Unilateral primary osteoarthritis, right knee: Secondary | ICD-10-CM | POA: Diagnosis not present

## 2017-06-18 DIAGNOSIS — M1712 Unilateral primary osteoarthritis, left knee: Secondary | ICD-10-CM | POA: Diagnosis not present

## 2017-06-18 MED ORDER — HYDROCODONE-ACETAMINOPHEN 5-325 MG PO TABS: 1.0000 | ORAL_TABLET | ORAL | 0 refills | Status: DC | PRN

## 2017-06-18 NOTE — Telephone Encounter (Signed)
Prescription up front for pick up. Patient notified and verbalized understanding. Patient scheduled follow up office visit.

## 2017-06-22 ENCOUNTER — Ambulatory Visit (INDEPENDENT_AMBULATORY_CARE_PROVIDER_SITE_OTHER): Payer: PPO | Admitting: Internal Medicine

## 2017-06-22 ENCOUNTER — Encounter (INDEPENDENT_AMBULATORY_CARE_PROVIDER_SITE_OTHER): Payer: Self-pay | Admitting: Internal Medicine

## 2017-06-22 VITALS — BP 162/60 | HR 68 | Temp 97.9°F | Ht 65.0 in | Wt 221.0 lb

## 2017-06-22 DIAGNOSIS — K219 Gastro-esophageal reflux disease without esophagitis: Secondary | ICD-10-CM | POA: Diagnosis not present

## 2017-06-22 MED ORDER — PANTOPRAZOLE SODIUM 40 MG PO TBEC: 40.0000 mg | DELAYED_RELEASE_TABLET | Freq: Two times a day (BID) | ORAL | 3 refills | Status: DC

## 2017-06-22 NOTE — Progress Notes (Signed)
Subjective:    [name], female    DOB: 05/11/1940, 77 y.o.   MRN: [id number]  HPI  Here today for f/u.  She tells me her acid reflux is bothering her. She has been sleeping in a chair for the past 2 yrs due to the acid reflux. She is going to have knee replacement in July. She is taking Zantac twice a day for the GERD. Zantac is not helping.   Her appetite is good. No weight loss. She has a BM x 1 a day as long as she drinks coffee. She is not exercising because of pain in both knees.  She says she is having some dysphagia which is not daily. She says episodes.     06/07/2014 EGD: Hx of erosive esophagitis.  Impression: Previously identified erosive esophagitis is healed. No evidence of gastritis or peptic ulcer disease. Small sliding hiatal hernia without Schatzki's ring. Esophagus dilated by passing 56 French Maloney dilator but no mucosal disruption noted.  1231/2013 EGD" Erosive esophagitis. Hiatal hernia.  04/18/2015  Colonoscopy:  Hx of colonic adenomas.  Prep excellent. Two small polyps ablated via cold biopsy from hepatic flexure and submitted together. Mucosa of rest of the colon was normal. Normal rectal mucosa. Two anal papillae and small hemorrhoids below the dentate line.  2 polyps removed from proximal colon. One polyp was a tubular adenoma. Patient has history of tubular adenomas.  Review of Systems Past Medical History:  Diagnosis Date  . Anxiety   . Arthritis   . Depression   . Fibromyalgia   . GERD (gastroesophageal reflux disease)    EGD/ colon 1/09  . H pylori ulcer 1980-1990   s/p treatment  . Headache   . Hyperlipidemia   . IBS (irritable bowel syndrome)   . Neck pain   . OSA (obstructive sleep apnea)   . Refusal of blood transfusions as patient is Jehovah's Witness   . Vertigo     Past Surgical History:  Procedure Laterality Date  . ABDOMINAL HYSTERECTOMY  1970  . APPENDECTOMY    . BILATERAL SALPINGOOPHORECTOMY  1990s    . CARDIAC CATHETERIZATION  2002   [facility]) normal coronary arteries   . COLONOSCOPY  01/2007   Dr. Meriel Flavors  . COLONOSCOPY N/A 04/18/2015   Procedure: COLONOSCOPY;  Surgeon: Rogene Houston, MD;  Location: [facility];  Service: Endoscopy;  Laterality: N/A;  830 - moved to 8:55 - Ann to notify pt  . COLONOSCOPY WITH ESOPHAGOGASTRODUODENOSCOPY (EGD)  02/16/2012   Procedure: COLONOSCOPY WITH ESOPHAGOGASTRODUODENOSCOPY (EGD);  Surgeon: Daneil Dolin, MD;  Location: [facility];  Service: Endoscopy;  Laterality: N/A;  8:45  . ESOPHAGEAL DILATION N/A 06/07/2014   Procedure: ESOPHAGEAL DILATION;  Surgeon: Rogene Houston, MD;  Location: [facility];  Service: Endoscopy;  Laterality: N/A;  . ESOPHAGOGASTRODUODENOSCOPY  01/2007   Dr Sharlett Iles- 3 cm hiatal hernia, benign esophageal biopsies, erosive esophagitis, gastritis  . ESOPHAGOGASTRODUODENOSCOPY N/A 06/07/2014   Procedure: ESOPHAGOGASTRODUODENOSCOPY (EGD);  Surgeon: Rogene Houston, MD;  Location: [facility];  Service: Endoscopy;  Laterality: N/A;  1200  . FOOT SURGERY    . INNER EAR SURGERY     Left  . KNEE SURGERY     Multiple  . SHOULDER SURGERY  2011  . TONSILLECTOMY    . TUBAL LIGATION      Allergies  Allergen Reactions  . Blood-Group Specific Substance     NO BLOOD PRODUCTS-refuses transfusions  . Ciprofloxacin Other (  See Comments)    Pt reports extreme fatigue  . Codeine Nausea And Vomiting  . Vicodin [Hydrocodone-Acetaminophen]     Nausea and vomiting  . Tramadol     Increased heart rate,increased blood pressure, edema    Current Outpatient Medications on File Prior to Visit  Medication Sig Dispense Refill  . albuterol (PROVENTIL HFA;VENTOLIN HFA) 108 (90 Base) MCG/ACT inhaler Inhale 2 puffs into the lungs every 6 (six) hours as needed for wheezing. 1 Inhaler 2  . diazepam (VALIUM) 5 MG tablet 1 bid prn anxiety 30 tablet 1  . dicyclomine (BENTYL) 10 MG capsule Take 1 capsule (10 mg total) by mouth 3 (three)  times daily as needed. For stomach cramps 90 capsule 5  . FLUoxetine (PROZAC) 20 MG tablet Take 20 mg by mouth as needed.     . fluticasone (FLONASE) 50 MCG/ACT nasal spray Place 2 sprays into both nostrils daily.    Marland Kitchen HYDROcodone-acetaminophen (NORCO/VICODIN) 5-325 MG tablet Take 1 tablet by mouth every 4 (four) hours as needed for severe pain. 30 tablet 0  . nitroGLYCERIN (NITROSTAT) 0.4 MG SL tablet Place 1 tablet (0.4 mg total) under the tongue every 5 (five) minutes as needed for chest pain. 20 tablet 0  . ranitidine (ZANTAC) 150 MG tablet Take 1 tablet (150 mg total) 2 (two) times daily by mouth. 180 tablet 1  . vitamin C (ASCORBIC ACID) 500 MG tablet Take 500-1,000 mg by mouth daily.       No current facility-administered medications on file prior to visit.         Objective:   Physical Exam Blood pressure (!) 162/60, pulse 68, temperature 97.9 F (36.6 C), height 5\' 5"  (1.651 m), weight 221 lb (100.2 kg). Alert and oriented. Skin warm and dry. Oral mucosa is moist.   . Sclera anicteric, conjunctivae is pink. Thyroid not enlarged. No cervical lymphadenopathy. Lungs clear. Heart regular rate and rhythm.  Abdomen is soft. Bowel sounds are positive. No hepatomegaly. No abdominal masses felt. No tenderness.  No edema to lower extremities.           Assessment & Plan:  GERD. Am going to switch her to Protonix BID. She will let me know how she is doing in about 3 weeks. If not better, will proceed with an EGD.

## 2017-06-22 NOTE — Patient Instructions (Addendum)
Protonix 40mg  BID. PR in 3 weeks.

## 2017-07-01 DIAGNOSIS — M1712 Unilateral primary osteoarthritis, left knee: Secondary | ICD-10-CM | POA: Diagnosis not present

## 2017-07-01 DIAGNOSIS — M1711 Unilateral primary osteoarthritis, right knee: Secondary | ICD-10-CM | POA: Diagnosis not present

## 2017-07-02 DIAGNOSIS — Z978 Presence of other specified devices: Secondary | ICD-10-CM | POA: Diagnosis not present

## 2017-07-02 DIAGNOSIS — M2042 Other hammer toe(s) (acquired), left foot: Secondary | ICD-10-CM | POA: Diagnosis not present

## 2017-07-02 DIAGNOSIS — Z09 Encounter for follow-up examination after completed treatment for conditions other than malignant neoplasm: Secondary | ICD-10-CM | POA: Diagnosis not present

## 2017-07-08 ENCOUNTER — Ambulatory Visit: Payer: PPO | Admitting: Family Medicine

## 2017-07-08 DIAGNOSIS — M25562 Pain in left knee: Secondary | ICD-10-CM | POA: Diagnosis not present

## 2017-07-08 DIAGNOSIS — M25561 Pain in right knee: Secondary | ICD-10-CM | POA: Diagnosis not present

## 2017-07-14 ENCOUNTER — Telehealth (INDEPENDENT_AMBULATORY_CARE_PROVIDER_SITE_OTHER): Payer: Self-pay | Admitting: Internal Medicine

## 2017-07-14 ENCOUNTER — Other Ambulatory Visit (INDEPENDENT_AMBULATORY_CARE_PROVIDER_SITE_OTHER): Payer: Self-pay | Admitting: Internal Medicine

## 2017-07-14 ENCOUNTER — Encounter: Payer: Self-pay | Admitting: Family Medicine

## 2017-07-14 ENCOUNTER — Ambulatory Visit (INDEPENDENT_AMBULATORY_CARE_PROVIDER_SITE_OTHER): Payer: PPO | Admitting: Family Medicine

## 2017-07-14 VITALS — BP 126/70 | Ht 64.0 in | Wt 218.0 lb

## 2017-07-14 DIAGNOSIS — R7303 Prediabetes: Secondary | ICD-10-CM | POA: Diagnosis not present

## 2017-07-14 DIAGNOSIS — M797 Fibromyalgia: Secondary | ICD-10-CM

## 2017-07-14 DIAGNOSIS — E782 Mixed hyperlipidemia: Secondary | ICD-10-CM

## 2017-07-14 DIAGNOSIS — F320 Major depressive disorder, single episode, mild: Secondary | ICD-10-CM

## 2017-07-14 DIAGNOSIS — M170 Bilateral primary osteoarthritis of knee: Secondary | ICD-10-CM

## 2017-07-14 DIAGNOSIS — Z79891 Long term (current) use of opiate analgesic: Secondary | ICD-10-CM

## 2017-07-14 DIAGNOSIS — K219 Gastro-esophageal reflux disease without esophagitis: Secondary | ICD-10-CM

## 2017-07-14 MED ORDER — GABAPENTIN 100 MG PO CAPS: 100.0000 mg | ORAL_CAPSULE | Freq: Three times a day (TID) | ORAL | 1 refills | Status: DC

## 2017-07-14 MED ORDER — MOMETASONE FUROATE 0.1 % EX CREA: TOPICAL_CREAM | CUTANEOUS | 0 refills | Status: DC

## 2017-07-14 MED ORDER — KETOCONAZOLE 2 % EX CREA: 1.0000 "application " | TOPICAL_CREAM | Freq: Every day | CUTANEOUS | 0 refills | Status: DC

## 2017-07-14 MED ORDER — OXYCODONE-ACETAMINOPHEN 5-325 MG PO TABS: ORAL_TABLET | ORAL | 0 refills | Status: DC

## 2017-07-14 NOTE — Telephone Encounter (Signed)
Patient called stating she has been taking pantroprazole 40mg  - stated she thinks she is worse - please call [phone]

## 2017-07-14 NOTE — Telephone Encounter (Signed)
I have spoken with patient.  Ruth [name], EGD

## 2017-07-14 NOTE — Telephone Encounter (Signed)
Ruth [name], EGD. GERD.

## 2017-07-14 NOTE — Progress Notes (Signed)
Subjective:    [name], female    DOB: 1940/06/06, 77 y.o.   MRN: [id number]  HPI  This patient was seen today for chronic pain  The medication list was reviewed and updated.   -Compliance with medication:   - Number patient states they take daily: as needed some days she does not take any other days she takes more.  -when was the last dose patient took? Sunday or Monday unsure.  The patient was advised the importance of maintaining medication and not using illegal substances with these.  Here for refills and follow up  The patient was educated that we can provide 3 monthly scripts for their medication, it is their responsibility to follow the instructions.  Side effects or complications from medications: makes her sick and "crazy headed".  Patient is aware that pain medications are meant to minimize the severity of the pain to allow their pain levels to improve to allow for better function. They are aware of that pain medications cannot totally remove their pain.  Due for UDT ( at least once per year) : due today        Review of Systems  Constitutional: Negative for activity change and appetite change.  HENT: Negative for congestion.   Respiratory: Negative for cough.   Cardiovascular: Negative for chest pain.  Gastrointestinal: Negative for abdominal pain and vomiting.  Skin: Negative for color change.  Neurological: Negative for weakness.  Psychiatric/Behavioral: Negative for confusion.       Objective:   Physical Exam Neck no masses no adenopathy trachea normal lungs clear no crackles heart regular no murmurs extremities no edema skin warm dry subjective patient has significant arthritis in both knees with pain Patient with erythematous area on the right lower arm approximately 2 inches x 4 inches it is possible this could be a metal allergy versus atopic dermatitis I recommend steroid cream  Patient does get yeast rash underneath the  breasts requests prescription for ketoconazole  25 minutes was spent with the patient.  This statement verifies that 25 minutes was indeed spent with the patient. Greater than half the time was spent in discussion, counseling and answering questions  regarding the issues that the patient came in for today as reflected in the diagnosis (s) please refer to documentation for further details. Significant time was spent today discussing fibromyalgia chronic pain pain medication choices as well as surgical options and the need for surgical clearance      Assessment & Plan:  surg decision-the patient needs to decide whether or not she is going to do knee surgery.  Overall she is hopeful to do so once she makes this decision we will help set her up with cardiology for presurgical clearance patient is unable to do 4 METS therefore she would need to have a chemical stress test and imaging  Percocet effectiveness-hydrocodone made her nauseous she would like to try Percocet I cautioned her to only use half tablet at a time she will give [place] feedback in the next couple weeks how that is doing.  If that seems to be doing fine we could use it intermittently if she ever decides to use this on a regular basis we would have to do a pain management contract patient is aware it can cause drowsiness not to operate machinery with it  Gabapentin up date-gabapentin for fibromyalgia 100 mg gradually titrate up to 1 3 times daily caution drowsiness and dizziness if it causes side effects use  the least non-side effect dosage  Patient will do lab work to help clear her regarding her chronic illnesses continue current medications  Patient does have medical clearance for her surgery but she will still need to notify us when surgery is going to be so we can get her set up for cardiac clearance

## 2017-07-15 ENCOUNTER — Encounter (INDEPENDENT_AMBULATORY_CARE_PROVIDER_SITE_OTHER): Payer: Self-pay | Admitting: *Deleted

## 2017-07-15 DIAGNOSIS — K219 Gastro-esophageal reflux disease without esophagitis: Secondary | ICD-10-CM | POA: Insufficient documentation

## 2017-07-15 NOTE — Telephone Encounter (Signed)
EGD sch'd 08/18/17 at 220 (120), patient aware, instructions mailed

## 2017-07-20 DIAGNOSIS — J383 Other diseases of vocal cords: Secondary | ICD-10-CM | POA: Diagnosis not present

## 2017-07-20 DIAGNOSIS — J343 Hypertrophy of nasal turbinates: Secondary | ICD-10-CM | POA: Diagnosis not present

## 2017-07-20 DIAGNOSIS — J342 Deviated nasal septum: Secondary | ICD-10-CM | POA: Diagnosis not present

## 2017-07-20 DIAGNOSIS — J310 Chronic rhinitis: Secondary | ICD-10-CM | POA: Diagnosis not present

## 2017-08-05 NOTE — Progress Notes (Signed)
Please place orders in epic. Thank you! 

## 2017-08-18 ENCOUNTER — Other Ambulatory Visit: Payer: Self-pay

## 2017-08-18 ENCOUNTER — Encounter (HOSPITAL_COMMUNITY): Payer: Self-pay | Admitting: *Deleted

## 2017-08-18 ENCOUNTER — Encounter (HOSPITAL_COMMUNITY)
Admission: RE | Disposition: A | Discharge status: Home / Self Care | Payer: Self-pay | Source: Ambulatory Visit | Attending: Internal Medicine

## 2017-08-18 ENCOUNTER — Ambulatory Visit (HOSPITAL_COMMUNITY)
Admission: RE | Admit: 2017-08-18 | Discharge: 2017-08-18 | Disposition: A | Discharge status: Home / Self Care | Payer: PPO | Source: Ambulatory Visit | Attending: Internal Medicine | Admitting: Internal Medicine

## 2017-08-18 DIAGNOSIS — M797 Fibromyalgia: Secondary | ICD-10-CM | POA: Insufficient documentation

## 2017-08-18 DIAGNOSIS — Z91040 Latex allergy status: Secondary | ICD-10-CM | POA: Insufficient documentation

## 2017-08-18 DIAGNOSIS — Z888 Allergy status to other drugs, medicaments and biological substances status: Secondary | ICD-10-CM | POA: Insufficient documentation

## 2017-08-18 DIAGNOSIS — E785 Hyperlipidemia, unspecified: Secondary | ICD-10-CM | POA: Diagnosis not present

## 2017-08-18 DIAGNOSIS — Z8371 Family history of colonic polyps: Secondary | ICD-10-CM | POA: Diagnosis not present

## 2017-08-18 DIAGNOSIS — F419 Anxiety disorder, unspecified: Secondary | ICD-10-CM | POA: Insufficient documentation

## 2017-08-18 DIAGNOSIS — Z881 Allergy status to other antibiotic agents status: Secondary | ICD-10-CM | POA: Insufficient documentation

## 2017-08-18 DIAGNOSIS — Z79899 Other long term (current) drug therapy: Secondary | ICD-10-CM | POA: Insufficient documentation

## 2017-08-18 DIAGNOSIS — R1314 Dysphagia, pharyngoesophageal phase: Secondary | ICD-10-CM | POA: Diagnosis not present

## 2017-08-18 DIAGNOSIS — R1310 Dysphagia, unspecified: Secondary | ICD-10-CM | POA: Diagnosis present

## 2017-08-18 DIAGNOSIS — K589 Irritable bowel syndrome without diarrhea: Secondary | ICD-10-CM | POA: Insufficient documentation

## 2017-08-18 DIAGNOSIS — G4733 Obstructive sleep apnea (adult) (pediatric): Secondary | ICD-10-CM | POA: Diagnosis not present

## 2017-08-18 DIAGNOSIS — K219 Gastro-esophageal reflux disease without esophagitis: Secondary | ICD-10-CM | POA: Diagnosis not present

## 2017-08-18 DIAGNOSIS — Z8249 Family history of ischemic heart disease and other diseases of the circulatory system: Secondary | ICD-10-CM | POA: Insufficient documentation

## 2017-08-18 DIAGNOSIS — Z885 Allergy status to narcotic agent status: Secondary | ICD-10-CM | POA: Diagnosis not present

## 2017-08-18 DIAGNOSIS — K210 Gastro-esophageal reflux disease with esophagitis: Secondary | ICD-10-CM | POA: Insufficient documentation

## 2017-08-18 DIAGNOSIS — F329 Major depressive disorder, single episode, unspecified: Secondary | ICD-10-CM | POA: Diagnosis not present

## 2017-08-18 DIAGNOSIS — M1990 Unspecified osteoarthritis, unspecified site: Secondary | ICD-10-CM | POA: Diagnosis not present

## 2017-08-18 HISTORY — PX: ESOPHAGOGASTRODUODENOSCOPY: SHX5428

## 2017-08-18 HISTORY — DX: Nausea with vomiting, unspecified: R11.2

## 2017-08-18 HISTORY — DX: Other specified postprocedural states: Z98.890

## 2017-08-18 SURGERY — EGD (ESOPHAGOGASTRODUODENOSCOPY)
Anesthesia: Moderate Sedation

## 2017-08-18 MED ORDER — STERILE WATER FOR IRRIGATION IR SOLN
Status: DC | PRN
  Administered 2017-08-18: 13:00:00

## 2017-08-18 MED ORDER — LIDOCAINE VISCOUS HCL 2 % MT SOLN
OROMUCOSAL | Status: AC
  Filled 2017-08-18: qty 15

## 2017-08-18 MED ORDER — SODIUM CHLORIDE 0.9 % IV SOLN
INTRAVENOUS | Status: DC
  Administered 2017-08-18: 1000 mL via INTRAVENOUS

## 2017-08-18 MED ORDER — MEPERIDINE HCL 50 MG/ML IJ SOLN
INTRAMUSCULAR | Status: DC | PRN
  Administered 2017-08-18 (×2): 25 mg via INTRAVENOUS

## 2017-08-18 MED ORDER — MIDAZOLAM HCL 5 MG/5ML IJ SOLN
INTRAMUSCULAR | Status: DC | PRN
  Administered 2017-08-18: 1 mg via INTRAVENOUS
  Administered 2017-08-18 (×3): 2 mg via INTRAVENOUS
  Administered 2017-08-18 (×2): 1 mg via INTRAVENOUS

## 2017-08-18 MED ORDER — LIDOCAINE VISCOUS HCL 2 % MT SOLN
OROMUCOSAL | Status: DC | PRN
  Administered 2017-08-18: 4 mL via OROMUCOSAL

## 2017-08-18 MED ORDER — MEPERIDINE HCL 50 MG/ML IJ SOLN
INTRAMUSCULAR | Status: AC
  Filled 2017-08-18: qty 1

## 2017-08-18 MED ORDER — MIDAZOLAM HCL 5 MG/5ML IJ SOLN
INTRAMUSCULAR | Status: AC
  Filled 2017-08-18: qty 10

## 2017-08-18 NOTE — Op Note (Signed)
[facility] Patient Name: Ruth [name] Procedure Date: 08/18/2017 12:30 PM MRN: [id number] Date of Birth: [birth year] Attending MD: Hildred Laser , MD CSN: [id number] Age: 77 Admit Type: Outpatient Procedure:                Upper GI endoscopy Indications:              Esophageal dysphagia, Follow-up of                            gastro-esophageal reflux disease Providers:                Hildred Laser, MD, Otis Peak B. Sharon Seller, RN, Randa Spike, Technician Referring MD:             Elayne Snare. Wolfgang Phoenix, MD Medicines:                Lidocaine spray, Meperidine 50 mg IV, Midazolam 9                            mg IV Complications:            No immediate complications. Estimated Blood Loss:     Estimated blood loss: none. Procedure:                Pre-Anesthesia Assessment:                           - Prior to the procedure, a History and Physical                            was performed, and patient medications and                            allergies were reviewed. The patient's tolerance of                            previous anesthesia was also reviewed. The risks                            and benefits of the procedure and the sedation                            options and risks were discussed with the patient.                            All questions were answered, and informed consent                            was obtained. Prior Anticoagulants: The patient has                            taken no previous anticoagulant or antiplatelet  agents. ASA Grade Assessment: II - A patient with                            mild systemic disease. After reviewing the risks                            and benefits, the patient was deemed in                            satisfactory condition to undergo the procedure.                           After obtaining informed consent, the endoscope was                            passed under direct  vision. Throughout the                            procedure, the patient's blood pressure, pulse, and                            oxygen saturations were monitored continuously. The                            EG-[id number] ([id number]) scope was introduced through the                            mouth, and advanced to the second part of duodenum.                            The upper GI endoscopy was accomplished without                            difficulty. The patient tolerated the procedure                            well. Scope In: 1:11:56 PM Scope Out: 1:22:10 PM Total Procedure Duration: 0 hours 10 minutes 14 seconds  Findings:      The examined esophagus was normal.      Focal erythema and edema was found at GEJ;36 cm from the incisors.      The entire examined stomach was normal.      The duodenal bulb and second portion of the duodenum were normal.      No endoscopic abnormality was evident in the esophagus to explain the       patient's complaint of dysphagia. It was decided, however, to proceed       with dilation of the entire esophagus. The scope was withdrawn. Dilation       was performed with a Maloney dilator with no resistance at 56 Fr. The       dilation site was examined following endoscope reinsertion and showed no       change and no bleeding, mucosal tear or perforation. Impression:               - Normal esophagus.                           -  Mild changes of reflux esophagitis at GEJ.                           - Normal stomach.                           - Normal duodenal bulb and second portion of the                            duodenum.                           - No endoscopic esophageal abnormality to explain                            patient's dysphagia. Esophagus dilated. Dilated.                           - No specimens collected. Moderate Sedation:      Moderate (conscious) sedation was administered by the endoscopy nurse       and supervised by the endoscopist.  The following parameters were       monitored: oxygen saturation, heart rate, blood pressure, CO2       capnography and response to care. Total physician intraservice time was       17 minutes. Recommendation:           - Patient has a contact number available for                            emergencies. The signs and symptoms of potential                            delayed complications were discussed with the                            patient. Return to normal activities tomorrow.                            Written discharge instructions were provided to the                            patient.                           - Resume previous diet today.                           - Continue present medications.                           - Begin Ranitidine at 150 mg po bid.                           - Telephone GI clinic in 4 weeks. Procedure Code(s):        --- Professional ---  24580, Esophagogastroduodenoscopy, flexible,                            transoral; diagnostic, including collection of                            specimen(s) by brushing or washing, when performed                            (separate procedure)                           43450, Dilation of esophagus, by unguided sound or                            bougie, single or multiple passes                           G0500, Moderate sedation services provided by the                            same physician or other qualified health care                            professional performing a gastrointestinal                            endoscopic service that sedation supports,                            requiring the presence of an independent trained                            observer to assist in the monitoring of the                            patient's level of consciousness and physiological                            status; initial 15 minutes of intra-service time;                             patient age 13 years or older (additional time may                            be reported with [phone], as appropriate) Diagnosis Code(s):        --- Professional ---                           K21.0, Gastro-esophageal reflux disease with                            esophagitis                           R13.14, Dysphagia, pharyngoesophageal  phase CPT copyright 2017 American Medical Association. All rights reserved. The codes documented in this report are preliminary and upon coder review may  be revised to meet current compliance requirements. Hildred Laser, MD Hildred Laser, MD 08/18/2017 1:36:52 PM This report has been signed electronically. Number of Addenda: 0

## 2017-08-18 NOTE — Discharge Instructions (Signed)
Resume usual medications. Ranitidine 150 mg by mouth 30 minutes before breakfast and evening meal daily. Keep symptom diary and call with progress report in 1 month. If symptoms not improved with ranitidine will consider 8 to 12-week trial with pantoprazole. Resume usual diet. No driving for 24 hours.   Upper Endoscopy, Care After Refer to this sheet in the next few weeks. These instructions provide you with information about caring for yourself after your procedure. Your health care provider may also give you more specific instructions. Your treatment has been planned according to current medical practices, but problems sometimes occur. Call your health care provider if you have any problems or questions after your procedure. What can I expect after the procedure? After the procedure, it is common to have:  A sore throat.  Bloating.  Nausea.  Follow these instructions at home:  Follow instructions from your health care provider about what to eat or drink after your procedure.  Return to your normal activities as told by your health care provider. Ask your health care provider what activities are safe for you.  Take over-the-counter and prescription medicines only as told by your health care provider.  Do not drive for 24 hours if you received a sedative.  Keep all follow-up visits as told by your health care provider. This is important. Contact a health care provider if:  You have a sore throat that lasts longer than one day.  You have trouble swallowing. Get help right away if:  You have a fever.  You vomit blood or your vomit looks like coffee grounds.  You have bloody, black, or tarry stools.  You have a severe sore throat or you cannot swallow.  You have difficulty breathing.  You have severe pain in your chest or belly. This information is not intended to replace advice given to you by your health care provider. Make sure you discuss any questions you have with  your health care provider. Document Released: 08/04/2011 Document Revised: 07/11/2015 Document Reviewed: 11/15/2014 Elsevier Interactive Patient Education  [employer].

## 2017-08-18 NOTE — H&P (Signed)
Ruth [name] is an 77 y.o. female.   Chief Complaint: Patient is here for EGD and possible EGD. HPI: Patient is 77 year old Caucasian female with chronic GERD but has been afraid to take medications for potential side effects.  She is presently not taking any PPI or H2B.  She complains of retrosternal pain and regurgitation when she lies flat.  She states she has been sleeping upright for the last 3 years.  She has noted occasional dysphagia to solids.  She points to the suprasternal area.  She wonders if she has spasms.  She says when her esophagus was dilated back in 2016 she was able to swallow much better.  She denies nausea vomiting hematemesis or melena.  Past Medical History:  Diagnosis Date  . Anxiety   . Arthritis   . Depression   . Fibromyalgia   . GERD (gastroesophageal reflux disease)    EGD/ colon 1/09  . H pylori ulcer 1980-1990   s/p treatment  . Headache   . Hyperlipidemia   . IBS (irritable bowel syndrome)   . Neck pain   . OSA (obstructive sleep apnea)   . PONV (postoperative nausea and vomiting)   . Refusal of blood transfusions as patient is Jehovah's Witness   . Vertigo     Past Surgical History:  Procedure Laterality Date  . ABDOMINAL HYSTERECTOMY  1970  . APPENDECTOMY    . BILATERAL SALPINGOOPHORECTOMY  1990s  . CARDIAC CATHETERIZATION  2002   [facility]) normal coronary arteries   . COLONOSCOPY  01/2007   Dr. Meriel Flavors  . COLONOSCOPY N/A 04/18/2015   Procedure: COLONOSCOPY;  Surgeon: Rogene Houston, MD;  Location: [facility];  Service: Endoscopy;  Laterality: N/A;  830 - moved to 8:55 - Ann to notify pt  . COLONOSCOPY WITH ESOPHAGOGASTRODUODENOSCOPY (EGD)  02/16/2012   Procedure: COLONOSCOPY WITH ESOPHAGOGASTRODUODENOSCOPY (EGD);  Surgeon: Daneil Dolin, MD;  Location: [facility];  Service: Endoscopy;  Laterality: N/A;  8:45  . ESOPHAGEAL DILATION N/A 06/07/2014   Procedure: ESOPHAGEAL DILATION;  Surgeon: Rogene Houston, MD;  Location: [facility];  Service: Endoscopy;  Laterality: N/A;  . ESOPHAGOGASTRODUODENOSCOPY  01/2007   Dr Sharlett Iles- 3 cm hiatal hernia, benign esophageal biopsies, erosive esophagitis, gastritis  . ESOPHAGOGASTRODUODENOSCOPY N/A 06/07/2014   Procedure: ESOPHAGOGASTRODUODENOSCOPY (EGD);  Surgeon: Rogene Houston, MD;  Location: [facility];  Service: Endoscopy;  Laterality: N/A;  1200  . FOOT SURGERY    . INNER EAR SURGERY     Left  . KNEE SURGERY     Multiple  . SHOULDER SURGERY  2011  . TONSILLECTOMY    . TUBAL LIGATION      Family History  Problem Relation Age of Onset  . Diabetes Father   . Heart attack Father        91's  . Lung cancer Father   . Cirrhosis Father 48       etoh cirrhosis  . Lung cancer Sister   . Depression Sister   . Alzheimer's disease Sister   . Hypertension Sister   . Diabetes Sister   . Lung cancer Sister   . Paranoid behavior Mother        depression  . Colon polyps Mother    Social History:  reports that she has never smoked. She has never used smokeless tobacco. She reports that she drinks alcohol. She reports that she does not use drugs.  Allergies:  Allergies  Allergen Reactions  . Blood-Group Specific  Substance     NO BLOOD PRODUCTS-refuses transfusions  . Ciprofloxacin Other (See Comments)    Pt reports extreme fatigue  . Codeine Nausea And Vomiting  . Latex Other (See Comments)    Red and raw area around incision  . Vicodin [Hydrocodone-Acetaminophen] Nausea And Vomiting  . Tramadol     Increased heart rate,increased blood pressure, edema    Medications Prior to Admission  Medication Sig Dispense Refill  . COLLAGEN PO Take 10 mg by mouth daily.    . diclofenac sodium (VOLTAREN) 1 % GEL Apply 2 g topically daily as needed (knee pain).    Marland Kitchen FLUoxetine (PROZAC) 10 MG tablet Take 20 mg by mouth daily.     Marland Kitchen ketoconazole (NIZORAL) 2 % cream Apply 1 application topically daily. (Patient taking differently: Apply 1 application topically daily as  needed for irritation. ) 45 g 0  . oxyCODONE-acetaminophen (PERCOCET/ROXICET) 5-325 MG tablet 1/2 to 1 tablet q 6 hours prn pain 20 tablet 0  . Polyvinyl Alcohol-Povidone (REFRESH OP) Place 1 drop into both eyes 3 (three) times daily.    Marland Kitchen albuterol (PROVENTIL HFA;VENTOLIN HFA) 108 (90 Base) MCG/ACT inhaler Inhale 2 puffs into the lungs every 6 (six) hours as needed for wheezing. 1 Inhaler 2  . diazepam (VALIUM) 5 MG tablet 1 bid prn anxiety 30 tablet 1  . dicyclomine (BENTYL) 10 MG capsule Take 1 capsule (10 mg total) by mouth 3 (three) times daily as needed. For stomach cramps 90 capsule 5  . fluticasone (FLONASE) 50 MCG/ACT nasal spray Place 2 sprays into both nostrils daily.    Marland Kitchen gabapentin (NEURONTIN) 100 MG capsule Take 1 capsule (100 mg total) by mouth 3 (three) times daily. (Patient not taking: Reported on 08/16/2017) 90 capsule 1  . HYDROcodone-acetaminophen (NORCO/VICODIN) 5-325 MG tablet Take 1 tablet by mouth every 4 (four) hours as needed for severe pain. (Patient not taking: Reported on 08/16/2017) 30 tablet 0  . mometasone (ELOCON) 0.1 % cream Apply BID prn 15 g 0  . nitroGLYCERIN (NITROSTAT) 0.4 MG SL tablet Place 1 tablet (0.4 mg total) under the tongue every 5 (five) minutes as needed for chest pain. 20 tablet 0  . pantoprazole (PROTONIX) 40 MG tablet Take 1 tablet (40 mg total) by mouth 2 (two) times daily before a meal. (Patient not taking: Reported on 08/16/2017) 60 tablet 3  . ranitidine (ZANTAC) 150 MG tablet Take 1 tablet (150 mg total) 2 (two) times daily by mouth. (Patient not taking: Reported on 07/14/2017) 180 tablet 1    No results found for this or any previous visit (from the past 48 hour(s)). No results found.  ROS  Blood pressure 125/71, pulse 76, temperature 97.6 F (36.4 C), temperature source Oral, resp. rate 14, height 5\' 5"  (1.651 m), weight 215 lb (97.5 kg), SpO2 100 %. Physical Exam  Constitutional: She appears well-developed and well-nourished.  HENT:   Mouth/Throat: Oropharynx is clear and moist.  Eyes: Conjunctivae are normal. No scleral icterus.  Neck: No thyromegaly present.  Cardiovascular: Normal rate, regular rhythm and normal heart sounds.  No murmur heard. Respiratory: Effort normal and breath sounds normal.  GI: Soft. She exhibits no distension and no mass. There is no tenderness.  Musculoskeletal: She exhibits no edema.  Lymphadenopathy:    She has no cervical adenopathy.  Neurological: She is alert.  Skin: Skin is warm and dry.     Assessment/Plan Chronic GERD. Solid food dysphagia. EGD and possible ED.  Hildred Laser, MD 08/18/2017, 12:58  PM   

## 2017-08-25 ENCOUNTER — Encounter (HOSPITAL_COMMUNITY): Payer: Self-pay | Admitting: Internal Medicine

## 2017-08-30 ENCOUNTER — Encounter: Payer: Self-pay | Admitting: Family Medicine

## 2017-08-30 ENCOUNTER — Ambulatory Visit (INDEPENDENT_AMBULATORY_CARE_PROVIDER_SITE_OTHER): Payer: PPO | Admitting: Family Medicine

## 2017-08-30 VITALS — BP 138/74 | Temp 98.4°F | Ht 65.0 in | Wt 216.0 lb

## 2017-08-30 DIAGNOSIS — B001 Herpesviral vesicular dermatitis: Secondary | ICD-10-CM | POA: Diagnosis not present

## 2017-08-30 DIAGNOSIS — L739 Follicular disorder, unspecified: Secondary | ICD-10-CM

## 2017-08-30 MED ORDER — OXYCODONE-ACETAMINOPHEN 5-325 MG PO TABS: ORAL_TABLET | ORAL | 0 refills | Status: DC

## 2017-08-30 MED ORDER — DOXYCYCLINE HYCLATE 100 MG PO TABS: 100.0000 mg | ORAL_TABLET | Freq: Two times a day (BID) | ORAL | 0 refills | Status: DC

## 2017-08-30 MED ORDER — VALACYCLOVIR HCL 500 MG PO TABS: 500.0000 mg | ORAL_TABLET | Freq: Every day | ORAL | 3 refills | Status: DC

## 2017-08-30 NOTE — Progress Notes (Signed)
       [name], female    DOB: 11/29/1940, 77 y.o.   MRN: [id number]  HPIknots under both arms. Came up about one to two weeks ago. Tried warm compresses.  She relates pain underneath both arms red bumps she also relates sores in her nostrils is been going on for a while denies high fever chills sweats wheezing difficulty breathing Fever blisters.  She has these intermittently over the past several months  Sore in left nostrils. Off and on for awhile.   Left ear pain and left jaw pain. Sometimes swelling on left side of face and neck. Has seen dr Benjamine Mola for ear and dentist.   Review of Systems  Constitutional: Negative for activity change, appetite change and fatigue.  HENT: Negative for congestion and rhinorrhea.   Respiratory: Negative for cough and shortness of breath.   Cardiovascular: Negative for chest pain and leg swelling.  Gastrointestinal: Negative for abdominal pain and anal bleeding.  Skin: Negative for color change.  Neurological: Negative for light-headedness and headaches.  Psychiatric/Behavioral: Negative for behavioral problems and confusion.       Objective:   Physical Exam  Constitutional: She appears well-nourished. No distress.  Cardiovascular: Normal rate, regular rhythm and normal heart sounds.  No murmur heard. Pulmonary/Chest: Effort normal and breath sounds normal. No respiratory distress.  Musculoskeletal: She exhibits no edema.  Lymphadenopathy:    She has no cervical adenopathy.  Neurological: She is alert. She exhibits normal muscle tone.  Psychiatric: Her behavior is normal.  Vitals reviewed.  She has multiple small bumps underneath the arms both sides she also has a small sore in her nose and a healing fever blister   15 minutes was spent with patient today discussing healthcare issues which they came.  More than 50% of this visit-total duration of visit-was spent in counseling and coordination of care.  Please see diagnosis  regarding the focus of this coordination and care     Assessment & Plan:  Frequent blisters-Valtrex as directed daily to keep things under control  Probable staph infection under the arms and around her nodes recommend doxycycline twice daily for the next 2 weeks I recommend that she put off her knee replacement for at least 6 weeks She should tell her orthopedist about this infection they will need to test her before surgery for MRSA carrying

## 2017-09-06 ENCOUNTER — Inpatient Hospital Stay: Admit: 2017-09-06 | Payer: PPO | Admitting: Orthopedic Surgery

## 2017-09-06 DIAGNOSIS — M722 Plantar fascial fibromatosis: Secondary | ICD-10-CM | POA: Diagnosis not present

## 2017-09-06 DIAGNOSIS — M6701 Short Achilles tendon (acquired), right ankle: Secondary | ICD-10-CM | POA: Diagnosis not present

## 2017-09-06 DIAGNOSIS — M7661 Achilles tendinitis, right leg: Secondary | ICD-10-CM | POA: Diagnosis not present

## 2017-09-06 SURGERY — ARTHROPLASTY, KNEE, TOTAL
Anesthesia: Choice | Site: Knee | Laterality: Right

## 2017-09-21 DIAGNOSIS — M79671 Pain in right foot: Secondary | ICD-10-CM | POA: Insufficient documentation

## 2017-09-23 ENCOUNTER — Ambulatory Visit (INDEPENDENT_AMBULATORY_CARE_PROVIDER_SITE_OTHER): Payer: PPO | Admitting: Family Medicine

## 2017-09-23 ENCOUNTER — Encounter: Payer: Self-pay | Admitting: Family Medicine

## 2017-09-23 VITALS — BP 132/86 | Temp 97.9°F | Ht 65.0 in | Wt 212.8 lb

## 2017-09-23 DIAGNOSIS — M170 Bilateral primary osteoarthritis of knee: Secondary | ICD-10-CM

## 2017-09-23 DIAGNOSIS — M797 Fibromyalgia: Secondary | ICD-10-CM

## 2017-09-23 DIAGNOSIS — E7801 Familial hypercholesterolemia: Secondary | ICD-10-CM | POA: Diagnosis not present

## 2017-09-23 DIAGNOSIS — Z0000 Encounter for general adult medical examination without abnormal findings: Secondary | ICD-10-CM | POA: Diagnosis not present

## 2017-09-23 NOTE — Progress Notes (Signed)
   Subjective:    [name], female    DOB: 01-Jan-1941, 77 y.o.   MRN: [id number]  HPI Pt here today to get surgical clearance for knee replacement on 10/06/17. This patient had a thorough cardiac work-up last year when she was contemplating having knee surgery but then decided not to Her cardiac imaging stress test Myoview was negative for any significant ischemic issues She was given cardiac clearance at that time She has underlying history of hyperlipidemia along with fibromyalgia and osteoarthritis She also has a history of reflux disease.  Because of religious believes she refuses blood products She also states that she gets raw when exposed to latex but no hives She also relates Cipro causes fatigue, Vicodin causes nausea and vomiting but she has tolerated oxycodone Review of Systems  Constitutional: Negative for activity change, appetite change and fatigue.  HENT: Negative for congestion and rhinorrhea.   Eyes: Negative for discharge.  Respiratory: Negative for cough, chest tightness and wheezing.   Cardiovascular: Negative for chest pain.  Gastrointestinal: Negative for abdominal pain, blood in stool and vomiting.  Endocrine: Negative for polyphagia.  Genitourinary: Negative for difficulty urinating and frequency.  Musculoskeletal: Negative for neck pain.  Skin: Negative for color change.  Allergic/Immunologic: Negative for environmental allergies and food allergies.  Neurological: Negative for weakness and headaches.  Psychiatric/Behavioral: Negative for agitation and behavioral problems.       Objective:   Physical Exam  Constitutional: She is oriented to person, place, and time. She appears well-developed and well-nourished.  HENT:  Head: Normocephalic.  Right Ear: External ear normal.  Left Ear: External ear normal.  Eyes: Pupils are equal, round, and reactive to light.  Neck: Normal range of motion. No thyromegaly present.  Cardiovascular: Normal  rate, regular rhythm, normal heart sounds and intact distal pulses.  No murmur heard. Pulmonary/Chest: Effort normal and breath sounds normal. No respiratory distress. She has no wheezes.  Abdominal: Soft. Bowel sounds are normal. She exhibits no distension and no mass. There is no tenderness.  Musculoskeletal: Normal range of motion. She exhibits no edema or tenderness.  Lymphadenopathy:    She has no cervical adenopathy.  Neurological: She is alert and oriented to person, place, and time. She exhibits normal muscle tone.  Skin: Skin is warm and dry.  Psychiatric: She has a normal mood and affect. Her behavior is normal.  Blood pressure is good today 132/86 Severe osteoarthritis both knees on exam       Assessment & Plan:  Surgical clearance The patient does have surgical clearance for knee replacement She was advised to stay away from all anti-inflammatories at least 7 days prior to the surgery Patient has had previous cardiology clearance one year ago when she was contemplating to have surgery but then decided not to She is safe to proceed forward She was instructed to discuss with the surgeon risk and benefits of doing surgery She was also advised to follow any measures they recommend to prevent blood clots after surgery  This note will be forwarded to her orthopedist

## 2017-09-24 ENCOUNTER — Telehealth: Payer: Self-pay | Admitting: Family Medicine

## 2017-09-24 DIAGNOSIS — K589 Irritable bowel syndrome without diarrhea: Secondary | ICD-10-CM | POA: Diagnosis not present

## 2017-09-24 DIAGNOSIS — M1711 Unilateral primary osteoarthritis, right knee: Secondary | ICD-10-CM | POA: Diagnosis not present

## 2017-09-24 DIAGNOSIS — G4733 Obstructive sleep apnea (adult) (pediatric): Secondary | ICD-10-CM | POA: Diagnosis not present

## 2017-09-24 DIAGNOSIS — Z9109 Other allergy status, other than to drugs and biological substances: Secondary | ICD-10-CM | POA: Diagnosis not present

## 2017-09-24 DIAGNOSIS — Z01810 Encounter for preprocedural cardiovascular examination: Secondary | ICD-10-CM | POA: Diagnosis not present

## 2017-09-24 DIAGNOSIS — M797 Fibromyalgia: Secondary | ICD-10-CM | POA: Diagnosis not present

## 2017-09-24 DIAGNOSIS — K210 Gastro-esophageal reflux disease with esophagitis: Secondary | ICD-10-CM | POA: Diagnosis not present

## 2017-09-24 DIAGNOSIS — J45909 Unspecified asthma, uncomplicated: Secondary | ICD-10-CM | POA: Diagnosis not present

## 2017-09-24 DIAGNOSIS — K760 Fatty (change of) liver, not elsewhere classified: Secondary | ICD-10-CM | POA: Diagnosis not present

## 2017-09-24 DIAGNOSIS — E785 Hyperlipidemia, unspecified: Secondary | ICD-10-CM | POA: Diagnosis not present

## 2017-09-24 DIAGNOSIS — F1190 Opioid use, unspecified, uncomplicated: Secondary | ICD-10-CM | POA: Diagnosis not present

## 2017-09-24 DIAGNOSIS — F419 Anxiety disorder, unspecified: Secondary | ICD-10-CM | POA: Diagnosis not present

## 2017-09-24 DIAGNOSIS — M25561 Pain in right knee: Secondary | ICD-10-CM | POA: Diagnosis not present

## 2017-09-24 DIAGNOSIS — Z01818 Encounter for other preprocedural examination: Secondary | ICD-10-CM | POA: Diagnosis not present

## 2017-09-24 LAB — BASIC METABOLIC PANEL
BUN/Creatinine Ratio: 18 (ref 12–28)
BUN: 13 mg/dL (ref 8–27)
CO2: 24 mmol/L (ref 20–29)
Calcium: 9.5 mg/dL (ref 8.7–10.3)
Chloride: 103 mmol/L (ref 96–106)
Creatinine, Ser: 0.71 mg/dL (ref 0.57–1.00)
GFR calc Af Amer: 96 mL/min/{1.73_m2} (ref >59)
GFR calc non Af Amer: 83 mL/min/{1.73_m2} (ref >59)
Glucose: 100 mg/dL — ABNORMAL HIGH (ref 65–99)
Potassium: 4.9 mmol/L (ref 3.5–5.2)
Sodium: 141 mmol/L (ref 134–144)

## 2017-09-24 LAB — HEPATIC FUNCTION PANEL
ALT: 14 IU/L (ref 0–32)
AST: 13 IU/L (ref 0–40)
Albumin: 4.5 g/dL (ref 3.5–4.8)
Alkaline Phosphatase: 84 IU/L (ref 39–117)
Bilirubin Total: 0.2 mg/dL (ref 0.0–1.2)
Bilirubin, Direct: 0.06 mg/dL (ref 0.00–0.40)
Total Protein: 7 g/dL (ref 6.0–8.5)

## 2017-09-24 LAB — CBC WITH DIFFERENTIAL/PLATELET
Basophils Absolute: 0 10*3/uL (ref 0.0–0.2)
Basos: 0 %
EOS (ABSOLUTE): 0.2 10*3/uL (ref 0.0–0.4)
Eos: 3 %
Hematocrit: 39.8 % (ref 34.0–46.6)
Hemoglobin: 13.2 g/dL (ref 11.1–15.9)
Immature Grans (Abs): 0 10*3/uL (ref 0.0–0.1)
Immature Granulocytes: 0 %
Lymphocytes Absolute: 1.5 10*3/uL (ref 0.7–3.1)
Lymphs: 33 %
MCH: 30.6 pg (ref 26.6–33.0)
MCHC: 33.2 g/dL (ref 31.5–35.7)
MCV: 92 fL (ref 79–97)
Monocytes Absolute: 0.5 10*3/uL (ref 0.1–0.9)
Monocytes: 11 %
Neutrophils Absolute: 2.4 10*3/uL (ref 1.4–7.0)
Neutrophils: 53 %
Platelets: 255 10*3/uL (ref 150–450)
RBC: 4.31 x10E6/uL (ref 3.77–5.28)
RDW: 14.1 % (ref 12.3–15.4)
WBC: 4.6 10*3/uL (ref 3.4–10.8)

## 2017-09-24 LAB — LIPID PANEL
Chol/HDL Ratio: 5.2 ratio — ABNORMAL HIGH (ref 0.0–4.4)
Cholesterol, Total: 253 mg/dL — ABNORMAL HIGH (ref 100–199)
HDL: 49 mg/dL (ref >39)
LDL Calculated: 183 mg/dL — ABNORMAL HIGH (ref 0–99)
Triglycerides: 103 mg/dL (ref 0–149)
VLDL Cholesterol Cal: 21 mg/dL (ref 5–40)

## 2017-09-24 NOTE — Telephone Encounter (Signed)
Pt's husband calling to provide the fax number for [name] to have pt's lab results from yesterday faxed to. They are wanting to draw blood if they do not get the results. He would like to be called once they are faxed.

## 2017-09-24 NOTE — Telephone Encounter (Signed)
Fax [phone]. Pt is at office now. Please fax asap

## 2017-09-24 NOTE — Telephone Encounter (Signed)
Message sent to medical records to fax labs

## 2017-09-30 ENCOUNTER — Ambulatory Visit (INDEPENDENT_AMBULATORY_CARE_PROVIDER_SITE_OTHER): Payer: PPO | Admitting: Family Medicine

## 2017-09-30 VITALS — Ht 65.0 in | Wt 212.0 lb

## 2017-09-30 DIAGNOSIS — Z9109 Other allergy status, other than to drugs and biological substances: Secondary | ICD-10-CM | POA: Diagnosis not present

## 2017-09-30 DIAGNOSIS — L739 Follicular disorder, unspecified: Secondary | ICD-10-CM

## 2017-09-30 MED ORDER — DOXYCYCLINE HYCLATE 100 MG PO TABS: 100.0000 mg | ORAL_TABLET | Freq: Two times a day (BID) | ORAL | 0 refills | Status: DC

## 2017-09-30 MED ORDER — MUPIROCIN 2 % EX OINT: TOPICAL_OINTMENT | CUTANEOUS | 0 refills | Status: DC

## 2017-09-30 NOTE — Progress Notes (Addendum)
   Subjective:    [name], female    DOB: 04-14-1940, 77 y.o.   MRN: [id number]  HPI Patient arrives with bump under left arm that she noticed today.  Red area underneath the left arm No high fever chills or sweats No vomiting or diarrhea Energy level fair No other particular troubles Had recent infection underneath the arm Took doxycycline  Patient also with a rash on her right arm that comes and goes she states recently she was evaluated for knee replacement and the surgeon told her that that appeared to be a nickel allergy and recommended for her to have a particular type of surgery she states she is never had a nickel allergy she is interested in having it looked into Review of Systems Please see above.    Objective:   Physical Exam  Discussion held with patient 15 minutes was spent with patient today discussing healthcare issues which they came.  More than 50% of this visit-total duration of visit-was spent in counseling and coordination of care.  Please see diagnosis regarding the focus of this coordination and care  Has area of cellulitis but is not an abscess at this point underneath the left arm      Assessment & Plan:  Warm compresses Doxycycline twice daily for 2 weeks Bactroban ointment in the nose daily If ongoing troubles with this or if worse follow-up  We will connect with allergist regarding testing for nickel allergy I believe that that is a contact test If so the patient would be interested with being evaluated for with the referral

## 2017-10-04 NOTE — Progress Notes (Signed)
Nurses-please connect with patient.  Let her know that allergist does do this testing but at his [place] office.  Therefore we can refer her there for further testing for possibility of nickel allergy

## 2017-10-05 NOTE — Addendum Note (Signed)
Addended by: Dairl Ponder on: 10/05/2017 08:23 AM   Modules accepted: Orders

## 2017-10-05 NOTE — Progress Notes (Signed)
Referral ordered in Epic. Patient notified. 

## 2017-10-07 ENCOUNTER — Telehealth: Payer: Self-pay | Admitting: Family Medicine

## 2017-10-07 NOTE — Telephone Encounter (Signed)
Patient requesting refill on oxycodone 5/325 for knee pain

## 2017-10-07 NOTE — Telephone Encounter (Signed)
Scott to see 

## 2017-10-07 NOTE — Telephone Encounter (Signed)
Please advise 

## 2017-10-08 ENCOUNTER — Other Ambulatory Visit: Payer: Self-pay | Admitting: Family Medicine

## 2017-10-08 MED ORDER — OXYCODONE-ACETAMINOPHEN 5-325 MG PO TABS: ORAL_TABLET | ORAL | 0 refills | Status: DC

## 2017-10-08 NOTE — Telephone Encounter (Signed)
Pt informed that script was ready to be picked up. Pt states she will be up here to get it.

## 2017-10-08 NOTE — Telephone Encounter (Signed)
Prescription was printed out patient may pick up today

## 2017-10-12 ENCOUNTER — Telehealth: Payer: Self-pay | Admitting: Family Medicine

## 2017-10-12 NOTE — Telephone Encounter (Signed)
Pt calling in for some advice. She has 2 days left of the doxycycline (VIBRA-TABS) 100 MG tablet. This is her 2nd dose of it. 2 to 3 weeks prior she went and had 2 root canals done and she had 3 rounds of antibiotics. She is now having diarrhea and is wanting to know if she should finish the antibiotic or finish the last 2 days worth.

## 2017-10-12 NOTE — Telephone Encounter (Signed)
I would recommend stopping the antibiotic at this point Take a probiotic 1 daily If diarrhea is still happening come Thursday call us back and we can do a stool test (for C. Difficile)

## 2017-10-12 NOTE — Telephone Encounter (Signed)
Spoke with patient.  Pt informed to stop the antibiotic and start taking a probiotic daily. Pt states she has already started to take a probiotic. Pt states she has not had any foul smelling odor and has not seen any blood in stool but it is frequent. Pt stated she would call us back Thursday if diarrhea continued.

## 2017-10-12 NOTE — Progress Notes (Signed)
Found referral from [place] Updated it so that it would fall in [facility] workque Called patient to set up new patient appt No answer, no voicemail

## 2017-10-30 ENCOUNTER — Ambulatory Visit (HOSPITAL_COMMUNITY)
Admission: EM | Admit: 2017-10-30 | Discharge: 2017-10-30 | Disposition: A | Discharge status: Home / Self Care | Payer: PPO | Attending: Internal Medicine | Admitting: Internal Medicine

## 2017-10-30 ENCOUNTER — Encounter (HOSPITAL_COMMUNITY): Payer: Self-pay

## 2017-10-30 DIAGNOSIS — N3000 Acute cystitis without hematuria: Secondary | ICD-10-CM | POA: Diagnosis not present

## 2017-10-30 LAB — POCT URINALYSIS DIP (DEVICE)
Bilirubin Urine: NEGATIVE
Glucose, UA: 100 mg/dL — AB
Ketones, ur: NEGATIVE mg/dL
Leukocytes, UA: NEGATIVE
Nitrite: POSITIVE — AB
Protein, ur: NEGATIVE mg/dL
Specific Gravity, Urine: <=1.005 (ref 1.005–1.030)
Urobilinogen, UA: 1 mg/dL (ref 0.0–1.0)
pH: 6 (ref 5.0–8.0)

## 2017-10-30 MED ORDER — CEPHALEXIN 500 MG PO CAPS: 500.0000 mg | ORAL_CAPSULE | Freq: Two times a day (BID) | ORAL | 0 refills | Status: AC

## 2017-10-30 MED ORDER — PHENAZOPYRIDINE HCL 200 MG PO TABS: 200.0000 mg | ORAL_TABLET | Freq: Three times a day (TID) | ORAL | 0 refills | Status: DC

## 2017-10-30 NOTE — ED Provider Notes (Signed)
[facility]   CC: UTI symptoms  SUBJECTIVE:  Ruth [name] is a 77 y.o. female who complains of urinary frequency, urgency and dysuria for the past yesterday.  Admits to excessive caffeine use.  Denies delayed bathroom breaks or recent sexual encounter.  Mentions lower abdominal pressure as well, but denies pain. Has tried OTC medications AZO with temporary relief.  Symptoms are made worse with urination.  Admits to similar symptoms in the past.  Denies fever, chills, nausea, vomiting, abdominal pain, flank pain, abnormal vaginal discharge or bleeding, hematuria.    LMP: No LMP recorded. Patient has had a hysterectomy.  ROS: As in HPI.  Past Medical History:  Diagnosis Date  . Anxiety   . Arthritis   . Depression   . Fibromyalgia   . GERD (gastroesophageal reflux disease)    EGD/ colon 1/09  . H pylori ulcer 1980-1990   s/p treatment  . Headache   . Hyperlipidemia   . IBS (irritable bowel syndrome)   . Neck pain   . OSA (obstructive sleep apnea)   . PONV (postoperative nausea and vomiting)   . Refusal of blood transfusions as patient is Jehovah's Witness   . Vertigo    Past Surgical History:  Procedure Laterality Date  . ABDOMINAL HYSTERECTOMY  1970  . APPENDECTOMY    . BILATERAL SALPINGOOPHORECTOMY  1990s  . CARDIAC CATHETERIZATION  2002   [facility]) normal coronary arteries   . COLONOSCOPY  01/2007   Dr. Meriel Flavors  . COLONOSCOPY N/A 04/18/2015   Procedure: COLONOSCOPY;  Surgeon: Rogene Houston, MD;  [facility];  Service: Endoscopy;  Laterality: N/A;  830 - moved to 8:55 - Ann to notify pt  . COLONOSCOPY WITH ESOPHAGOGASTRODUODENOSCOPY (EGD)  02/16/2012   Procedure: COLONOSCOPY WITH ESOPHAGOGASTRODUODENOSCOPY (EGD);  Surgeon: Daneil Dolin, MD;  [facility];  Service: Endoscopy;  Laterality: N/A;  8:45  . ESOPHAGEAL DILATION N/A 06/07/2014   Procedure: ESOPHAGEAL DILATION;  Surgeon: Rogene Houston, MD;  [facility];   Service: Endoscopy;  Laterality: N/A;  . ESOPHAGOGASTRODUODENOSCOPY  01/2007   Dr Sharlett Iles- 3 cm hiatal hernia, benign esophageal biopsies, erosive esophagitis, gastritis  . ESOPHAGOGASTRODUODENOSCOPY N/A 06/07/2014   Procedure: ESOPHAGOGASTRODUODENOSCOPY (EGD);  Surgeon: Rogene Houston, MD;  [facility];  Service: Endoscopy;  Laterality: N/A;  1200  . ESOPHAGOGASTRODUODENOSCOPY N/A 08/18/2017   Procedure: ESOPHAGOGASTRODUODENOSCOPY (EGD);  Surgeon: Rogene Houston, MD;  [facility];  Service: Endoscopy;  Laterality: N/A;  2:20  . FOOT SURGERY    . INNER EAR SURGERY     Left  . KNEE SURGERY     Multiple  . SHOULDER SURGERY  2011  . TONSILLECTOMY    . TUBAL LIGATION     Allergies  Allergen Reactions  . Blood-Group Specific Substance     NO BLOOD PRODUCTS-refuses transfusions  . Ciprofloxacin Other (See Comments)    Pt reports extreme fatigue  . Codeine Nausea And Vomiting  . Latex Other (See Comments)    Red and raw area around incision  . Vicodin [Hydrocodone-Acetaminophen] Nausea And Vomiting  . Tramadol     Increased heart rate,increased blood pressure, edema   No current facility-administered medications on file prior to encounter.    Current Outpatient Medications on File Prior to Encounter  Medication Sig Dispense Refill  . albuterol (PROVENTIL HFA;VENTOLIN HFA) 108 (90 Base) MCG/ACT inhaler Inhale 2 puffs into the lungs every 6 (six) hours as needed for wheezing.  1 Inhaler 2  . COLLAGEN PO Take 10 mg by mouth daily.    . diazepam (VALIUM) 5 MG tablet 1 bid prn anxiety 30 tablet 1  . diclofenac sodium (VOLTAREN) 1 % GEL Apply 2 g topically daily as needed (knee pain).    Marland Kitchen dicyclomine (BENTYL) 10 MG capsule Take 1 capsule (10 mg total) by mouth 3 (three) times daily as needed. For stomach cramps 90 capsule 5  . doxycycline (VIBRA-TABS) 100 MG tablet Take 1 tablet (100 mg total) by mouth 2 (two) times daily. 28 tablet 0  . FLUoxetine (PROZAC) 10 MG  tablet Take 20 mg by mouth daily.     . fluticasone (FLONASE) 50 MCG/ACT nasal spray Place 2 sprays into both nostrils daily.    Marland Kitchen gabapentin (NEURONTIN) 100 MG capsule Take 1 capsule (100 mg total) by mouth 3 (three) times daily. (Patient not taking: Reported on 10/30/2017) 90 capsule 1  . ketoconazole (NIZORAL) 2 % cream Apply 1 application topically daily. (Patient taking differently: Apply 1 application topically daily as needed for irritation. ) 45 g 0  . mometasone (ELOCON) 0.1 % cream Apply BID prn 15 g 0  . mupirocin ointment (BACTROBAN) 2 % Apply to nostrils nightly 22 g 0  . nitroGLYCERIN (NITROSTAT) 0.4 MG SL tablet Place 1 tablet (0.4 mg total) under the tongue every 5 (five) minutes as needed for chest pain. 20 tablet 0  . oxyCODONE-acetaminophen (PERCOCET/ROXICET) 5-325 MG tablet 1/2 BID prn 30 tablet 0  . Polyvinyl Alcohol-Povidone (REFRESH OP) Place 1 drop into both eyes 3 (three) times daily.    . ranitidine (ZANTAC) 150 MG tablet Take 1 tablet (150 mg total) 2 (two) times daily by mouth. 180 tablet 1  . valACYclovir (VALTREX) 500 MG tablet Take 1 tablet (500 mg total) by mouth daily. 30 tablet 3   Social History   Socioeconomic History  . Marital status: Married    Spouse name: Not on file  . Number of children: 2  . Years of education: Not on file  . Highest education level: Not on file  Occupational History  . Occupation: Retired; Cytogeneticist  . Financial resource strain: Not on file  . Food insecurity:    Worry: Not on file    Inability: Not on file  . Transportation needs:    Medical: Not on file    Non-medical: Not on file  Tobacco Use  . Smoking status: Never Smoker  . Smokeless tobacco: Never Used  Substance and Sexual Activity  . Alcohol use: Yes    Alcohol/week: 0.0 standard drinks    Comment: Occasionally, holidays/special occasions  . Drug use: No  . Sexual activity: Yes    Partners: Male    Comment: Hysterectomy  Lifestyle  .  Physical activity:    Days per week: Not on file    Minutes per session: Not on file  . Stress: Not on file  Relationships  . Social connections:    Talks on phone: Not on file    Gets together: Not on file    Attends religious service: Not on file    Active member of club or organization: Not on file    Attends meetings of clubs or organizations: Not on file    Relationship status: Not on file  . Intimate partner violence:    Fear of current or ex partner: Not on file    Emotionally abused: Not on file    Physically abused: Not on  file    Forced sexual activity: Not on file  Other Topics Concern  . Not on file  Social History Narrative   Married   2 grown children, 1 adopted son-autistic-lives next door   Family History  Problem Relation Age of Onset  . Diabetes Father   . Heart attack Father        47's  . Lung cancer Father   . Cirrhosis Father 61       etoh cirrhosis  . Lung cancer Sister   . Depression Sister   . Alzheimer's disease Sister   . Hypertension Sister   . Diabetes Sister   . Lung cancer Sister   . Paranoid behavior Mother        depression  . Colon polyps Mother     OBJECTIVE:  Vitals:   10/30/17 1335 10/30/17 1340  BP: 135/66   Pulse: 70   Resp: (!) 99   Temp: 97.8 F (36.6 C)   TempSrc: Oral   SpO2: 100%   Weight:  214 lb (97.1 kg)   General appearance: AOx3 in no acute distress; nontoxic appearance HEENT: NCAT.  Lungs: clear to auscultation bilaterally without adventitious breath sounds Heart: regular rate and rhythm.   Abdomen: soft; non-distended; no tenderness; bowel sounds present; no guarding Back: no CVA tenderness Extremities: no edema; symmetrical with no gross deformities Skin: warm and dry Neurologic: Ambulates from chair to exam table with minimal difficulty, uses a cane Psychological: alert and cooperative; normal mood and affect  Labs Reviewed  POCT URINALYSIS DIP (DEVICE) - Abnormal; Notable for the following  components:      Result Value   Glucose, UA 100 (*)    Hgb urine dipstick SMALL (*)    Nitrite POSITIVE (*)    All other components within normal limits    ASSESSMENT & PLAN:  1. Acute cystitis without hematuria     Meds ordered this encounter  Medications  . cephALEXin (KEFLEX) 500 MG capsule    Sig: Take 1 capsule (500 mg total) by mouth 2 (two) times daily for 7 days.    Dispense:  14 capsule    Refill:  0    Order Specific Question:   Supervising Provider    Answer:   Wynona Luna [phone]  . phenazopyridine (PYRIDIUM) 200 MG tablet    Sig: Take 1 tablet (200 mg total) by mouth 3 (three) times daily.    Dispense:  6 tablet    Refill:  0    Order Specific Question:   Supervising Provider    Answer:   Wynona Luna [[id number]]   Urine culture sent.  We will call you with the results.   Push fluids and get plenty of rest.   Take antibiotic as directed and to completion Take pyridium as prescribed and as needed for symptomatic relief Return or follow up with PCP if symptoms persists Return here or go to ER if you have any new or worsening symptoms such as fever, worsening abdominal pain, nausea/vomiting, flank pain, etc...  Outlined signs and symptoms indicating need for more acute intervention. Patient verbalized understanding. After Visit Summary given.     Lestine Box, PA-C 10/30/17 1442

## 2017-10-30 NOTE — Discharge Instructions (Addendum)
Urine culture sent.  We will call you with the results.   Push fluids and get plenty of rest.   Take antibiotic as directed and to completion Take pyridium as prescribed and as needed for symptomatic relief Return or follow up with PCP if symptoms persists Return here or go to ER if you have any new or worsening symptoms such as fever, worsening abdominal pain, nausea/vomiting, flank pain, etc..Marland Kitchen

## 2017-10-30 NOTE — ED Triage Notes (Signed)
Pt states she thinks she has a UTI. Pt states she tried to use some AZO for relief.

## 2017-11-08 ENCOUNTER — Ambulatory Visit (INDEPENDENT_AMBULATORY_CARE_PROVIDER_SITE_OTHER): Payer: PPO | Admitting: Family Medicine

## 2017-11-08 ENCOUNTER — Encounter: Payer: Self-pay | Admitting: Family Medicine

## 2017-11-08 VITALS — BP 146/68 | HR 79 | Temp 98.1°F | Ht 65.0 in | Wt 220.0 lb

## 2017-11-08 DIAGNOSIS — B349 Viral infection, unspecified: Secondary | ICD-10-CM | POA: Diagnosis not present

## 2017-11-08 NOTE — Progress Notes (Signed)
   Subjective:    [name], female    DOB: 1940/11/24, 77 y.o.   MRN: [id number]  HPI  Patient is here today due to a cough and wheezing,runny nose,headache and sore throat for the last three days.( thinks started when had a bout of gerd/reflux in the night She feels like her lungs are on fire.She states she has been taking nitequil,day quil and essential oils.  Started Friday morning at 2 am with GERD exacerbation, continued with dry cough.  Saturday reports chest felt like it was on fire, this has improved. Symptoms of sneezing, rhinorrhea and continued cough started later that day. Reports h/a all over, relieved after taking dayquil. Reports sore throat and left otalgia.  Denies fever.   Review of Systems  Constitutional: Negative for fever.  HENT: Positive for ear pain, rhinorrhea and sore throat.   Respiratory: Positive for cough.        Objective:   Physical Exam  Constitutional: She is oriented to person, place, and time. She appears well-developed and well-nourished. No distress.  HENT:  Head: Normocephalic and atraumatic.  Right Ear: Tympanic membrane normal.  Left Ear: Tympanic membrane normal.  Nose: Mucosal edema present. No sinus tenderness.  Mouth/Throat: Oropharynx is clear and moist.  Eyes: Conjunctivae are normal. Right eye exhibits no discharge. Left eye exhibits no discharge.  Neck: Neck supple.  Cardiovascular: Normal rate, regular rhythm and normal heart sounds.  Pulmonary/Chest: Effort normal and breath sounds normal. No respiratory distress. She has no wheezes.  Lymphadenopathy:    She has no cervical adenopathy.  Neurological: She is alert and oriented to person, place, and time.  Skin: Skin is warm and dry.  Psychiatric: She has a normal mood and affect.  Nursing note and vitals reviewed. As attending physician to this patient visit, this patient was seen in conjunction with the nurse practitioner.  The history,physical and treatment  plan was reviewed with the nurse practitioner and pertinent findings were verified with the patient.  Also the treatment plan was reviewed with the patient while they were present.      Assessment & Plan:  Acute viral syndrome  Most likely viral illness. Symptomatic care discussed. Warning signs discussed, will f/u if symptoms worsen or fail to improve.

## 2017-11-12 ENCOUNTER — Encounter: Payer: Self-pay | Admitting: Family Medicine

## 2017-11-12 ENCOUNTER — Ambulatory Visit (INDEPENDENT_AMBULATORY_CARE_PROVIDER_SITE_OTHER): Payer: PPO | Admitting: Family Medicine

## 2017-11-12 VITALS — BP 152/68 | Temp 97.6°F | Wt 221.0 lb

## 2017-11-12 DIAGNOSIS — R300 Dysuria: Secondary | ICD-10-CM | POA: Diagnosis not present

## 2017-11-12 DIAGNOSIS — N3000 Acute cystitis without hematuria: Secondary | ICD-10-CM

## 2017-11-12 LAB — POCT URINALYSIS DIPSTICK
Spec Grav, UA: <=1.005 — AB (ref 1.010–1.025)
pH, UA: 7 (ref 5.0–8.0)

## 2017-11-12 MED ORDER — CEFDINIR 300 MG PO CAPS: 300.0000 mg | ORAL_CAPSULE | Freq: Two times a day (BID) | ORAL | 0 refills | Status: AC

## 2017-11-12 NOTE — Progress Notes (Signed)
Subjective:    [name], female    DOB: [birth year], 77 y.o.   MRN: [id number]  HPI   Patient is here today with compliant of Bladder pressure and also a sinus infections with colored mucus.On going since Monday.She has been taking azo and mucinex.  Pt was seen in UC on 9/14 for UTI and treated with cephalexin.  Reports she completed the abx and symptoms improved but never resolved, and now having increased bladder pressure, denies fever, dysuria, hematuria. Reports incomplete bladder emptying.  Pt also reports increased mucous with cough and blowing nose that is green in color. Reports congestion now with cough, denies rhinorrhea, headache or sore throat.  States lungs "feel raw."  Doesn't feel like symptoms are worse, just not getting better. Has used mucines and vicks vaporub and essential oils without relief.  Results for orders placed or performed in visit on 11/12/17  POCT urinalysis dipstick  Result Value Ref Range   Color, UA     Clarity, UA     Glucose, UA     Bilirubin, UA     Ketones, UA     Spec Grav, UA <=1.005 (A) 1.010 - 1.025   Blood, UA 3+    pH, UA 7.0 5.0 - 8.0   Protein, UA     Urobilinogen, UA     Nitrite, UA     Leukocytes, UA Trace (A) Negative   Appearance     Odor       Review of Systems  Constitutional: Negative for fever.  HENT: Positive for congestion. Negative for ear pain, rhinorrhea and sore throat.   Respiratory: Positive for cough. Negative for shortness of breath.   Gastrointestinal: Negative for abdominal pain.  Genitourinary: Positive for difficulty urinating. Negative for dysuria, hematuria and pelvic pain.       Objective:   Physical Exam  Constitutional: She is oriented to person, place, and time. She appears well-developed and well-nourished. No distress.  HENT:  Head: Normocephalic and atraumatic.  Right Ear: Tympanic membrane normal.  Left Ear: Tympanic membrane normal.  Nose: Mucosal edema present. No sinus  tenderness.  Mouth/Throat: Uvula is midline and oropharynx is clear and moist.  Eyes: Right eye exhibits no discharge. Left eye exhibits no discharge.  Neck: Neck supple.  Cardiovascular: Normal rate, regular rhythm and normal heart sounds.  No murmur heard. Pulmonary/Chest: Effort normal and breath sounds normal. No respiratory distress. She has no wheezes.  Abdominal: Soft. Bowel sounds are normal. She exhibits no distension and no mass. There is no tenderness.  Lymphadenopathy:    She has no cervical adenopathy.  Neurological: She is alert and oriented to person, place, and time.  Skin: Skin is warm and dry.  Psychiatric: She has a normal mood and affect.  Nursing note and vitals reviewed.   Few WBCs seen on microscopic exam, no RBCs seen.      Assessment & Plan:  1. Acute cystitis without hematuria  Will go ahead and treat for UTI, 2nd generation cephalosporin chosen to also cover possible respiratory infection as well given her symptoms. Urine to culture, will call with results. Warning signs discussed, f/u prn.  - cefdinir (OMNICEF) 300 MG capsule; Take 1 capsule (300 mg total) by mouth 2 (two) times daily for 7 days.  Patient also has respiratory issue for which she was seen earlier this week I believe that that is currently stable no need to do any type of intervention currently no sign of pneumonia  As attending physician to this patient visit, this patient was seen in conjunction with the nurse practitioner.  The history,physical and treatment plan was reviewed with the nurse practitioner and pertinent findings were verified with the patient.  Also the treatment plan was reviewed with the patient while they were present.

## 2017-11-14 ENCOUNTER — Other Ambulatory Visit: Payer: Self-pay | Admitting: Family Medicine

## 2017-11-14 MED ORDER — PHENAZOPYRIDINE HCL 200 MG PO TABS
200.0000 mg | ORAL_TABLET | Freq: Three times a day (TID) | ORAL | 2 refills | Status: DC | PRN
Start: 2017-11-14 — End: 2017-12-16

## 2017-11-17 LAB — SPECIMEN STATUS REPORT

## 2017-11-17 LAB — URINE CULTURE

## 2017-11-18 ENCOUNTER — Other Ambulatory Visit: Payer: Self-pay | Admitting: *Deleted

## 2017-11-18 ENCOUNTER — Telehealth: Payer: Self-pay | Admitting: Family Medicine

## 2017-11-18 MED ORDER — CIPROFLOXACIN HCL 500 MG PO TABS: 500.0000 mg | ORAL_TABLET | Freq: Two times a day (BID) | ORAL | 0 refills | Status: DC

## 2017-11-18 MED ORDER — NITROFURANTOIN MONOHYD MACRO 100 MG PO CAPS: 100.0000 mg | ORAL_CAPSULE | Freq: Two times a day (BID) | ORAL | 0 refills | Status: DC

## 2017-11-18 MED ORDER — SULFAMETHOXAZOLE-TRIMETHOPRIM 800-160 MG PO TABS: 1.0000 | ORAL_TABLET | Freq: Two times a day (BID) | ORAL | 0 refills | Status: DC

## 2017-11-18 NOTE — Telephone Encounter (Signed)
Discussed with pt and new meds sent to pharm with a note to cancel cipro. Pt verbalized understanding

## 2017-11-18 NOTE — Telephone Encounter (Signed)
Second choice  No cipro Use 2 meds Bactrim DS one bid 7 days AND macrobid 100 mg one bid 7 days

## 2017-11-18 NOTE — Telephone Encounter (Signed)
Pt has a question about her results that she thought of after getting off the phone with Abigail Butts. She would like a call back whenever is fine. She is said it is no rush.

## 2017-11-18 NOTE — Telephone Encounter (Signed)
Patient wants to know if there is another medication other than the cipro that would cover her uti. She states she is scared to take the cipro as it has made her feel bad in the past. Also wants to know if eating Rosealee Albee is enough to help with preventing yeast or does she need to take a probiotic aware. [facility] [place])

## 2017-11-25 DIAGNOSIS — L309 Dermatitis, unspecified: Secondary | ICD-10-CM | POA: Diagnosis not present

## 2017-11-29 ENCOUNTER — Telehealth: Payer: Self-pay | Admitting: Family Medicine

## 2017-11-29 DIAGNOSIS — N281 Cyst of kidney, acquired: Secondary | ICD-10-CM | POA: Diagnosis not present

## 2017-11-29 DIAGNOSIS — N200 Calculus of kidney: Secondary | ICD-10-CM | POA: Diagnosis not present

## 2017-11-29 DIAGNOSIS — N3000 Acute cystitis without hematuria: Secondary | ICD-10-CM | POA: Diagnosis not present

## 2017-11-29 DIAGNOSIS — R350 Frequency of micturition: Secondary | ICD-10-CM | POA: Diagnosis not present

## 2017-11-29 NOTE — Telephone Encounter (Signed)
Please go ahead and send this information ASAP it would be fine to have [place] do this

## 2017-11-29 NOTE — Telephone Encounter (Signed)
Pt is requesting results of cultures (2) and medications that where prescribed for her UTI's faxed to [facility] today before pts appts. Pt is still having symptoms of UTI and they would like to see the medications and results from the last ones. Fax # for [facility] [phone]. Pt's appt is at 1:00 today with Dr. Festus Aloe.

## 2017-11-29 NOTE — Telephone Encounter (Signed)
May we send information.

## 2017-12-01 ENCOUNTER — Ambulatory Visit: Payer: PPO | Admitting: Allergy & Immunology

## 2017-12-15 ENCOUNTER — Ambulatory Visit (INDEPENDENT_AMBULATORY_CARE_PROVIDER_SITE_OTHER): Payer: PPO | Admitting: Family Medicine

## 2017-12-15 ENCOUNTER — Encounter: Payer: Self-pay | Admitting: Family Medicine

## 2017-12-15 VITALS — Temp 97.6°F | Ht 65.0 in | Wt 221.2 lb

## 2017-12-15 DIAGNOSIS — R300 Dysuria: Secondary | ICD-10-CM

## 2017-12-15 DIAGNOSIS — R7303 Prediabetes: Secondary | ICD-10-CM

## 2017-12-15 DIAGNOSIS — M170 Bilateral primary osteoarthritis of knee: Secondary | ICD-10-CM | POA: Diagnosis not present

## 2017-12-15 DIAGNOSIS — Z23 Encounter for immunization: Secondary | ICD-10-CM | POA: Diagnosis not present

## 2017-12-15 LAB — POCT URINALYSIS DIPSTICK
Spec Grav, UA: 1.01 (ref 1.010–1.025)
pH, UA: 5 (ref 5.0–8.0)

## 2017-12-15 MED ORDER — OXYCODONE-ACETAMINOPHEN 5-325 MG PO TABS: ORAL_TABLET | ORAL | 0 refills | Status: DC

## 2017-12-15 NOTE — Progress Notes (Signed)
   Subjective:    [name], female    DOB: Dec 07, 1940, 77 y.o.   MRN: [id number]  Urinary Tract Infection   This is a new problem. Pertinent negatives include no nausea or vomiting. Associated symptoms comments: pressure.  Dysuria urinary frequency denies high fever chills hematuria denies abdominal pain flank pains.  PMH benign Pt has seen urologist.   Pt would also like a prescription for Oxycodone.   Patient uses oxycodone sparingly for her knees she will be doing knee surgery in December she has been previously surgically cleared by cardiology and by [place] Results for orders placed or performed in visit on 12/15/17  POCT Urinalysis Dipstick  Result Value Ref Range   Color, UA     Clarity, UA     Glucose, UA     Bilirubin, UA +    Ketones, UA     Spec Grav, UA 1.010 1.010 - 1.025   Blood, UA     pH, UA 5.0 5.0 - 8.0   Protein, UA     Urobilinogen, UA     Nitrite, UA     Leukocytes, UA Trace (A) Negative   Appearance     Odor      Review of Systems  Constitutional: Negative for activity change and appetite change.  HENT: Negative for congestion and rhinorrhea.   Respiratory: Negative for cough and shortness of breath.   Cardiovascular: Negative for chest pain and leg swelling.  Gastrointestinal: Negative for abdominal pain, nausea and vomiting.  Skin: Negative for color change.  Neurological: Negative for dizziness and weakness.  Psychiatric/Behavioral: Negative for agitation and confusion.       Objective:   Physical Exam  Constitutional: She appears well-nourished. No distress.  HENT:  Head: Normocephalic.  Cardiovascular: Normal rate, regular rhythm and normal heart sounds.  No murmur heard. Pulmonary/Chest: Effort normal and breath sounds normal.  Musculoskeletal: She exhibits no edema.  Lymphadenopathy:    She has no cervical adenopathy.  Neurological: She is alert.  Psychiatric: Her behavior is normal.  Vitals reviewed.           Assessment & Plan:  Surgical clearance for knee surgery Patient had nuclear medicine test back in March 2018 which was negative Patient very sedentary because of her knees Urinary frequency send urine culture hold off on antibiotics currently  The patient did have a visit with internal medicine earlier this year that did have evaluation on her She also had cardiac testing last year She is considered to be approved for surgery

## 2017-12-16 ENCOUNTER — Ambulatory Visit (INDEPENDENT_AMBULATORY_CARE_PROVIDER_SITE_OTHER): Payer: PPO | Admitting: Allergy & Immunology

## 2017-12-16 ENCOUNTER — Encounter: Payer: Self-pay | Admitting: Allergy & Immunology

## 2017-12-16 VITALS — BP 132/78 | HR 81 | Temp 97.6°F | Resp 20 | Ht 64.0 in | Wt 222.0 lb

## 2017-12-16 DIAGNOSIS — J310 Chronic rhinitis: Secondary | ICD-10-CM | POA: Diagnosis not present

## 2017-12-16 DIAGNOSIS — Z9109 Other allergy status, other than to drugs and biological substances: Secondary | ICD-10-CM

## 2017-12-16 NOTE — Patient Instructions (Addendum)
1. Chronic rhinitis - Testing was negative to the entire panel, including dogs. - She might just be bringing in extra dust that could be causing more rhinitis symptoms. - I would recommend getting HEPA filters for at least the bedroom to capture more of these dust particles. - Dust alone can cause non-specific inflammation of the nares (nasal passages).   2. Allergy to metal - Come back on Monday for testing with readings on Wednesday and Friday in [place]. - The placement of the patches (Monday) will have to be done in [place].  3. Return in about 4 days (around 12/20/2017).   Please inform us of any Emergency Department visits, hospitalizations, or changes in symptoms. Call us before going to the ED for breathing or allergy symptoms since we might be able to fit you in for a sick visit. Feel free to contact us anytime with any questions, problems, or concerns.  It was a pleasure to meet you and your family today!   Websites that have reliable patient information: 1. American Academy of Asthma, Allergy, and Immunology: www.aaaai.org 2. Food Allergy Research and Education (FARE): foodallergy.org 3. Mothers of Asthmatics: http://www.asthmacommunitynetwork.org 4. American College of Allergy, Asthma, and Immunology: MonthlyElectricBill.co.uk   Make sure you are registered to vote! If you have moved or changed any of your contact information, you will need to get this updated before voting!
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
## 2017-12-17 LAB — SPECIMEN STATUS REPORT

## 2017-12-17 LAB — URINE CULTURE

## 2017-12-20 ENCOUNTER — Ambulatory Visit (INDEPENDENT_AMBULATORY_CARE_PROVIDER_SITE_OTHER): Payer: PPO | Admitting: Allergy

## 2017-12-20 ENCOUNTER — Encounter: Payer: Self-pay | Admitting: Allergy

## 2017-12-20 VITALS — BP 134/64 | HR 79 | Resp 16

## 2017-12-20 DIAGNOSIS — L2389 Allergic contact dermatitis due to other agents: Secondary | ICD-10-CM | POA: Diagnosis not present

## 2017-12-20 NOTE — Assessment & Plan Note (Signed)
Concern for nickel allergy. Patient scheduled for knee surgery.  Metal patch testing placed.

## 2017-12-20 NOTE — Progress Notes (Signed)
   Follow Up Note  RE: Ruth [name] MRN: [id number] DOB: Feb 06, 1941 Date of Office Visit: 12/20/2017  Referring provider: Kathyrn Drown, MD Primary care provider: Kathyrn Drown, MD  History of Present Illness: I had the pleasure of seeing Ruth [name] for a follow up visit at the [facility] [facility] [place] on 12/20/2017. She is a 77 y.o. female, who is being followed for metal allergy and non-allergic rhinitis. Today she is here for patch test placement, given suspected history of contact dermatitis. Patient is scheduled for knee surgery.  Diagnostics: Metal Test patches placed.   Assessment and Plan: Ruth [name] is a 77 y.o. female with: Other allergic contact dermatitis Concern for nickel allergy. Patient scheduled for knee surgery.  Metal patch testing placed.   The patient was instructed regarding proper care of the patches for the next 48 hours. Do not get patches wet - avoid showering until the next visit. Do not engage in vigorous physical activity.  Patient will follow up in 48 hours and 96 hours for patch readings.  It was my pleasure to see Ruth [name] today and participate in her care. Please feel free to contact me with any questions or concerns.  Sincerely,  Rexene Alberts, DO Allergy & Immunology  [facility] office: [phone] [place]

## 2017-12-20 NOTE — Patient Instructions (Addendum)
Other allergic contact dermatitis Concern for nickel allergy. Patient scheduled for knee surgery.  Metal patch testing placed.  Return in about 2 days (around 12/22/2017) for Patch reading.

## 2017-12-22 ENCOUNTER — Ambulatory Visit: Payer: PPO | Admitting: Allergy & Immunology

## 2017-12-22 ENCOUNTER — Encounter: Payer: Self-pay | Admitting: Allergy & Immunology

## 2017-12-22 DIAGNOSIS — L2389 Allergic contact dermatitis due to other agents: Secondary | ICD-10-CM

## 2017-12-22 NOTE — Progress Notes (Signed)
    Follow-up Note  RE: Ruth [name] MRN: [id number] DOB: May 11, 1940 Date of Office Visit: 12/22/2017  Primary care provider: Kathyrn Drown, MD Referring provider: Kathyrn Drown, MD   Shanzay returns to the office today for the initial patch test interpretation, given suspected history of contact dermatitis.    Diagnostics:   Metal Series 48-hour hour reading: negative to the entire panel  Plan:   Allergic contact dermatitis - Ruth [name] will follow up in two days for the final reading.     Salvatore Marvel, MD  [facility] [place] [facility] [place]

## 2017-12-24 ENCOUNTER — Encounter: Payer: Self-pay | Admitting: Allergy & Immunology

## 2017-12-24 ENCOUNTER — Ambulatory Visit: Payer: PPO | Admitting: Allergy & Immunology

## 2017-12-24 DIAGNOSIS — L2389 Allergic contact dermatitis due to other agents: Secondary | ICD-10-CM | POA: Diagnosis not present

## 2017-12-24 NOTE — Progress Notes (Signed)
    Follow-up Note  RE: Ruth [name] MRN: [id number] DOB: 1940-05-19 Date of Office Visit: 12/24/2017  Primary care provider: Kathyrn Drown, MD Referring provider: Kathyrn Drown, MD   Delany returns to the office today for the final patch test interpretation, given suspected history of contact dermatitis. She has done well since the last visit and denies any pruritis.    Diagnostics:   Metal Series 96-hour hour reading: negative to the entire panel.  Metals included on the panel: chromium chloride, potassium dichromate, cobalt chloride hexahydrate, copper sulfate pentahydrate, molybdenum chloride, titanium, tental, manganese chloride, nickel sulfate hexahydrate, aluminum hydroxide, and vanadium pentoxide.   Plan:   Allergic contact dermatitis - Testing today was negative to the entire panel. - Testing has excellent negative predictive value, therefore these values make an allergic reaction to these metals unlikely. - We will forward a copy of these tests to Dr. Wynelle Link.    Salvatore Marvel, MD  [facility] [place] [facility] [place]

## 2017-12-28 IMAGING — DX DG CHEST 2V
2 series · 2 of 2 positions shown · non-contrast
Comparison: Radiographs July 13, 2015.

CLINICAL DATA: Chest pain.

EXAM:
CHEST  2 VIEW

[chest pa]
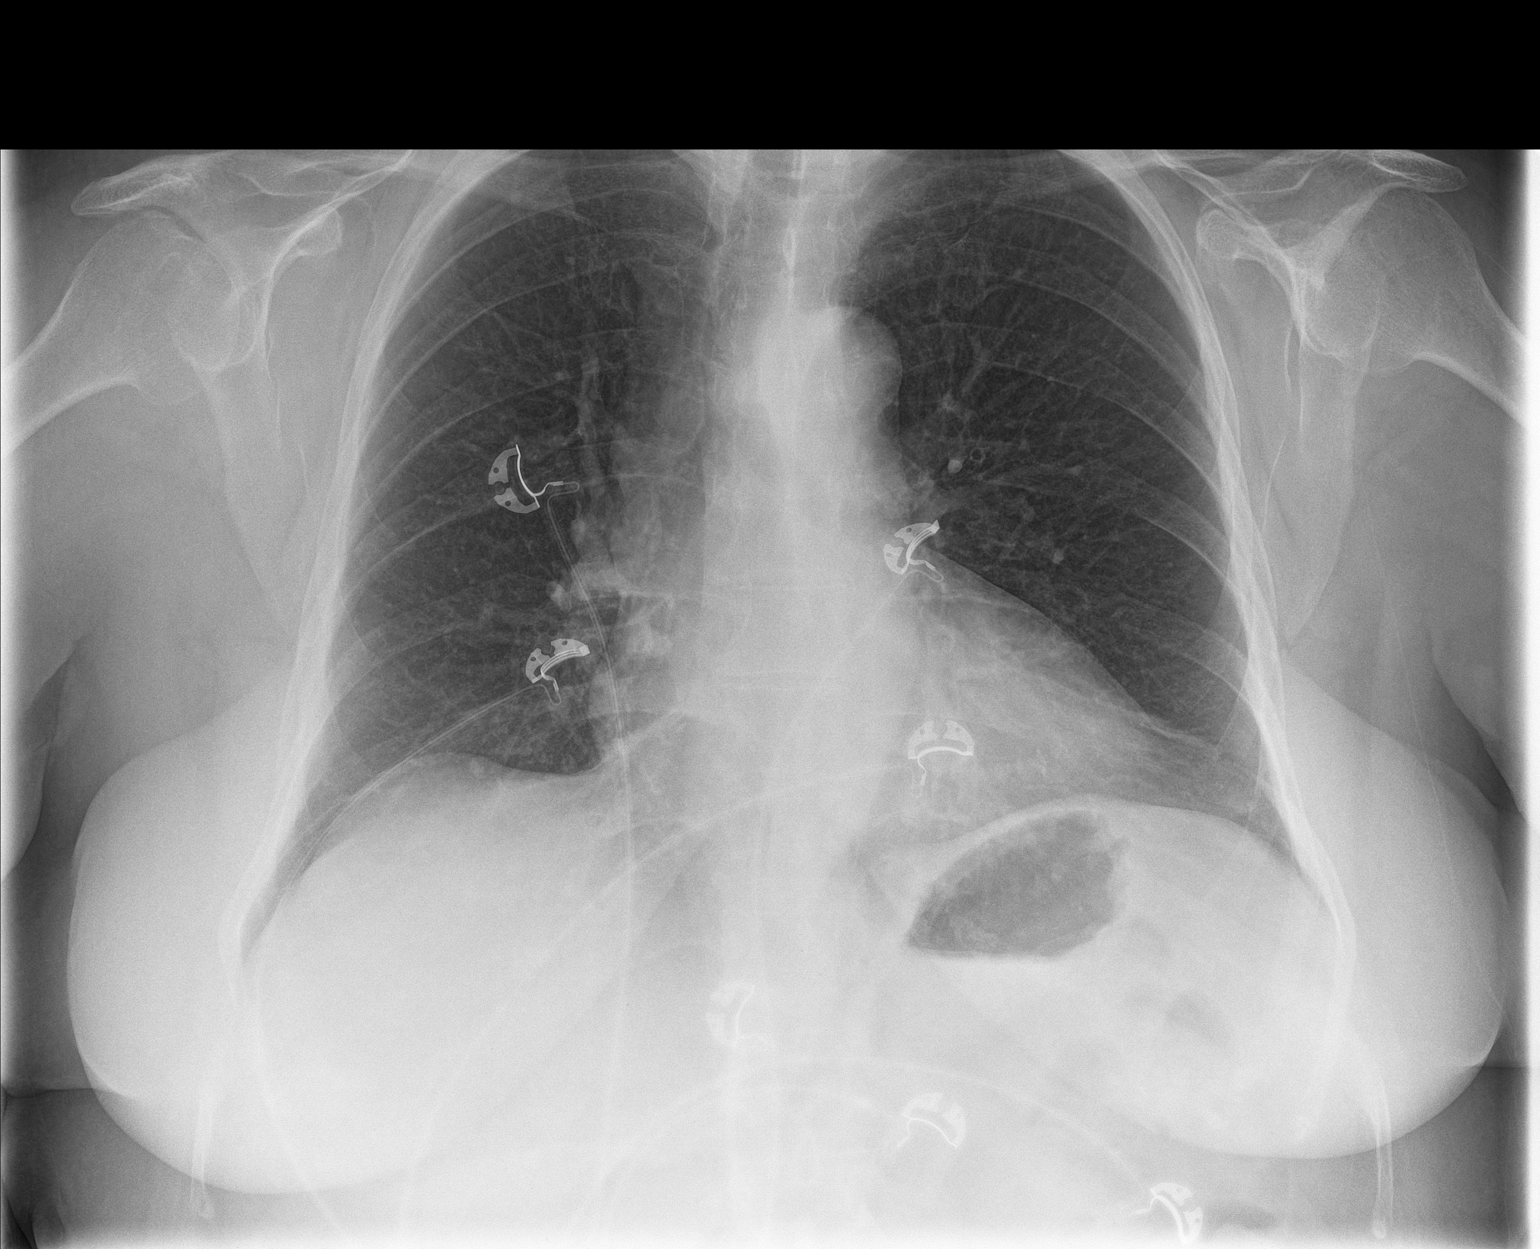

[chest lat]
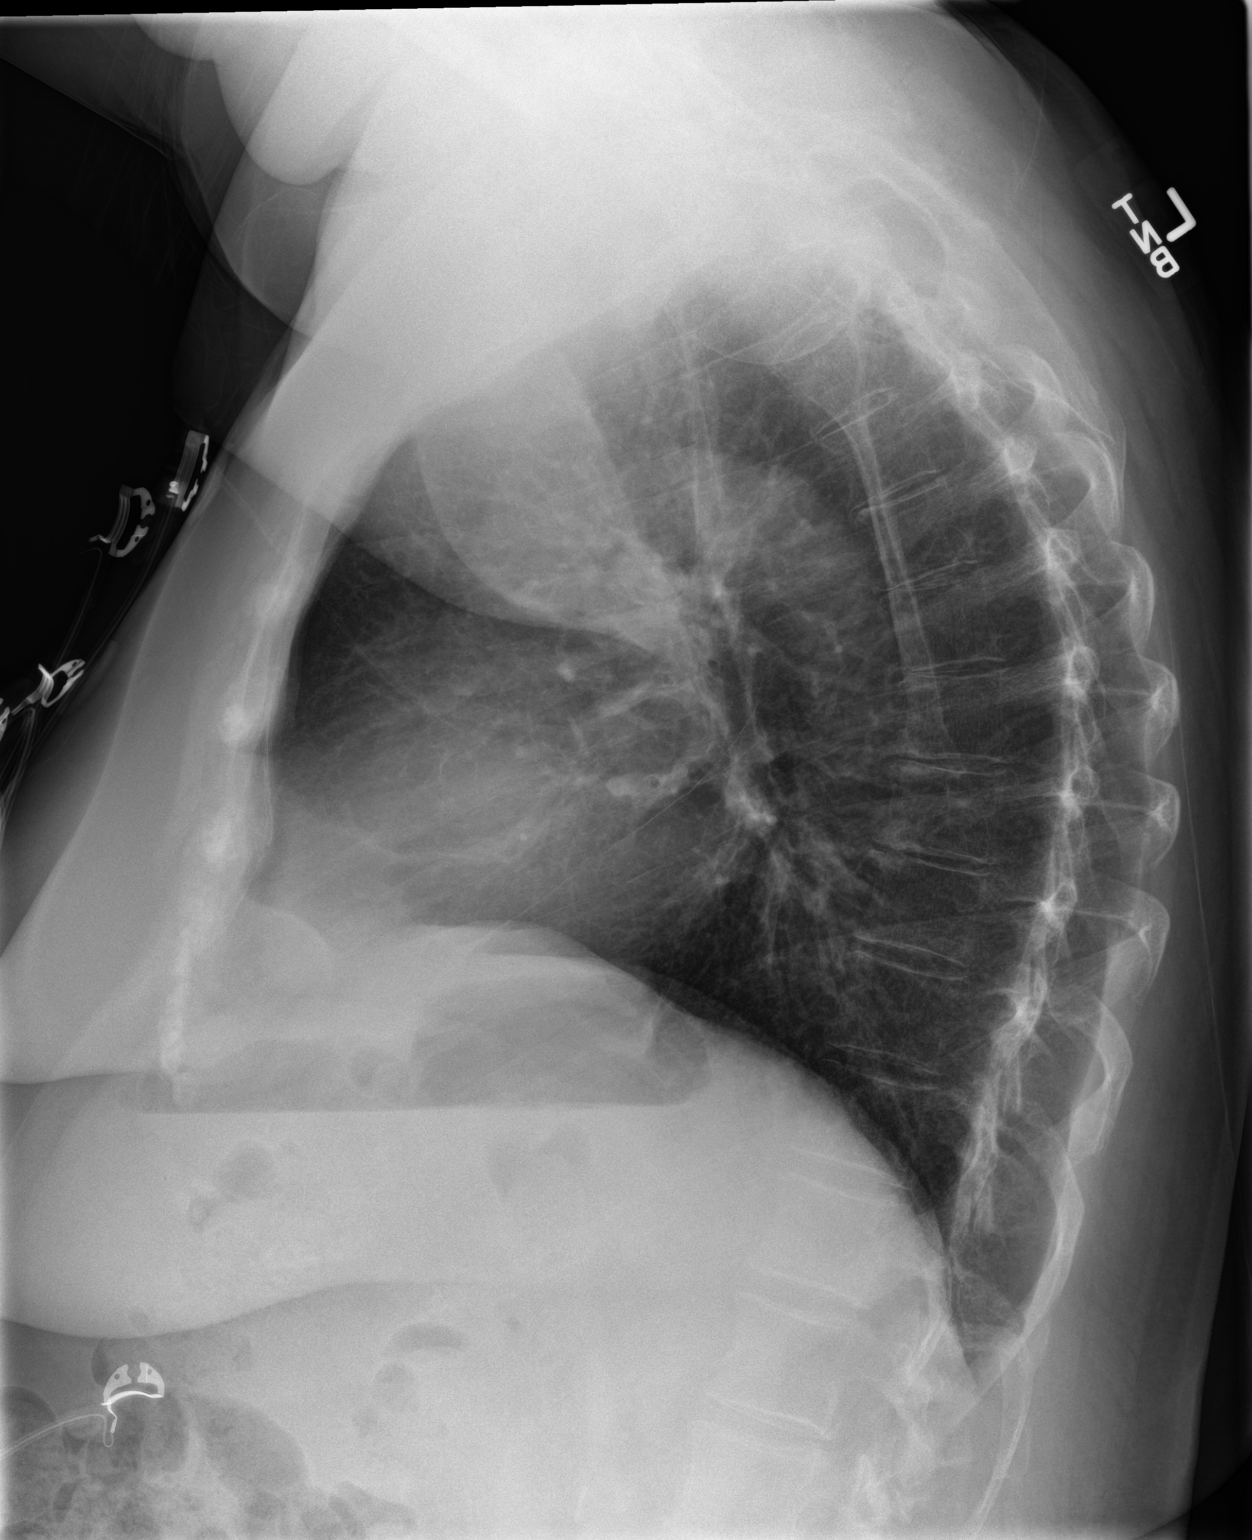

[2 of 2 positions shown; findings below may reference images not displayed]

FINDINGS: The heart size and mediastinal contours are within normal limits.
Both lungs are clear. No pneumothorax or pleural effusion is noted.
The visualized skeletal structures are unremarkable.
IMPRESSION: No active cardiopulmonary disease.

## 2017-12-29 DIAGNOSIS — M170 Bilateral primary osteoarthritis of knee: Secondary | ICD-10-CM | POA: Diagnosis not present

## 2017-12-29 DIAGNOSIS — M1711 Unilateral primary osteoarthritis, right knee: Secondary | ICD-10-CM | POA: Diagnosis not present

## 2017-12-29 DIAGNOSIS — M1712 Unilateral primary osteoarthritis, left knee: Secondary | ICD-10-CM | POA: Diagnosis not present

## 2018-01-12 DIAGNOSIS — N200 Calculus of kidney: Secondary | ICD-10-CM | POA: Diagnosis not present

## 2018-01-12 DIAGNOSIS — M1711 Unilateral primary osteoarthritis, right knee: Secondary | ICD-10-CM | POA: Diagnosis not present

## 2018-01-12 DIAGNOSIS — M25551 Pain in right hip: Secondary | ICD-10-CM | POA: Diagnosis not present

## 2018-01-12 DIAGNOSIS — M1712 Unilateral primary osteoarthritis, left knee: Secondary | ICD-10-CM | POA: Diagnosis not present

## 2018-01-12 DIAGNOSIS — N281 Cyst of kidney, acquired: Secondary | ICD-10-CM | POA: Diagnosis not present

## 2018-01-19 ENCOUNTER — Encounter: Payer: Self-pay | Admitting: Family Medicine

## 2018-01-19 ENCOUNTER — Ambulatory Visit (INDEPENDENT_AMBULATORY_CARE_PROVIDER_SITE_OTHER): Payer: PPO | Admitting: Family Medicine

## 2018-01-19 DIAGNOSIS — M170 Bilateral primary osteoarthritis of knee: Secondary | ICD-10-CM

## 2018-01-19 MED ORDER — OXYCODONE-ACETAMINOPHEN 5-325 MG PO TABS: ORAL_TABLET | ORAL | 0 refills | Status: DC

## 2018-01-19 MED ORDER — FLUOXETINE HCL 20 MG PO TABS: 20.0000 mg | ORAL_TABLET | Freq: Every day | ORAL | 5 refills | Status: DC

## 2018-01-19 NOTE — Progress Notes (Signed)
   Subjective:    [name], female    DOB: 04-05-1940, 77 y.o.   MRN: [id number]  HPIpt arrives for surgical clearance for left knee total replacement. Surgery dec 23rd. [facility] with Dr. Maureen Ralphs.   Having some concerns with taking prozac. Pt has been changing up how she takes it. Was taking 10mg  daily instead of 20mg . She was trying to stop it for the surgery. Pt feels like now she cannot stop it. Has ready that people over 70 should take this med.  Patient understands that she should go ahead and start the medicine she denies being suicidal  Patient saw cardiology back in 2018 had thorough work-up including stress Myoview which was negative for coronary artery disease.   Review of Systems  Constitutional: Negative for activity change, appetite change and fatigue.  HENT: Negative for congestion.   Respiratory: Negative for cough and shortness of breath.   Cardiovascular: Negative for chest pain and leg swelling.  Gastrointestinal: Negative for abdominal pain and constipation.  Skin: Negative for color change.  Neurological: Negative for light-headedness and headaches.  Psychiatric/Behavioral: Negative for behavioral problems and confusion.       Objective:   Physical Exam  Constitutional: She appears well-nourished. No distress.  HENT:  Head: Normocephalic.  Cardiovascular: Normal rate, regular rhythm and normal heart sounds.  No murmur heard. Pulmonary/Chest: Effort normal and breath sounds normal.  Musculoskeletal: She exhibits no edema.  Lymphadenopathy:    She has no cervical adenopathy.  Neurological: She is alert.  Psychiatric: Her behavior is normal.  Vitals reviewed.         Assessment & Plan:  Surgical clearance She is approved for surgery Had cardiac clearance last year Not having any chest pain or shortness of breath since then Had negative Myoview last year  Preoperative lab work is recommended  I agree with the concept of doing a  spinal block for her surgery and avoiding general anesthesia if possible

## 2018-01-25 DIAGNOSIS — M1712 Unilateral primary osteoarthritis, left knee: Secondary | ICD-10-CM | POA: Diagnosis not present

## 2018-01-27 DIAGNOSIS — M1712 Unilateral primary osteoarthritis, left knee: Secondary | ICD-10-CM | POA: Diagnosis not present

## 2018-01-31 NOTE — Progress Notes (Signed)
12-15-17 (Epic) Surgical Clearance by Dr. Wolfgang Phoenix  12-30-16 (Epic) ECHO

## 2018-01-31 NOTE — Patient Instructions (Addendum)
Ruth [name]  01/31/2018   Your procedure is scheduled on: 02-07-18    Report to [facility] Main  Entrance    Report to Admitting at 6:55 AM    Call this number if you have problems the morning of surgery [phone]    Remember: Do not eat food or drink liquids :After Midnight.    BRUSH YOUR TEETH MORNING OF SURGERY AND RINSE YOUR MOUTH OUT, NO CHEWING GUM CANDY OR MINTS.     Take these medicines the morning of surgery with A SIP OF WATER: Diazepam (Valium), and Pantoprazole (Protonix). You may also bring and use your nasal spray, inhaler, and eyedrops.                                You may not have any metal on your body including hair pins and              piercings  Do not wear jewelry, make-up, lotions, powders or perfumes, deodorant             Do not wear nail polish.  Do not shave  48 hours prior to surgery.                Do not bring valuables to the hospital. [place].  Contacts, dentures or bridgework may not be worn into surgery.  Leave suitcase in the car. After surgery it may be brought to your room.    Special Instructions: N/A              Please read over the following fact sheets you were given: _____________________________________________________________________             [facility] - Preparing for Surgery Before surgery, you can play an important role.  Because skin is not sterile, your skin needs to be as free of germs as possible.  You can reduce the number of germs on your skin by washing with CHG (chlorahexidine gluconate) soap before surgery.  CHG is an antiseptic cleaner which kills germs and bonds with the skin to continue killing germs even after washing. Please DO NOT use if you have an allergy to CHG or antibacterial soaps.  If your skin becomes reddened/irritated stop using the CHG and inform your nurse when you arrive at Short Stay. Do not shave (including  legs and underarms) for at least 48 hours prior to the first CHG shower.  You may shave your face/neck. Please follow these instructions carefully:  1.  Shower with CHG Soap the night before surgery and the  morning of Surgery.  2.  If you choose to wash your hair, wash your hair first as usual with your  normal  shampoo.  3.  After you shampoo, rinse your hair and body thoroughly to remove the  shampoo.                           4.  Use CHG as you would any other liquid soap.  You can apply chg directly  to the skin and wash                       Gently with  a scrungie or clean washcloth.  5.  Apply the CHG Soap to your body ONLY FROM THE NECK DOWN.   Do not use on face/ open                           Wound or open sores. Avoid contact with eyes, ears mouth and genitals (private parts).                       Wash face,  Genitals (private parts) with your normal soap.             6.  Wash thoroughly, paying special attention to the area where your surgery  will be performed.  7.  Thoroughly rinse your body with warm water from the neck down.  8.  DO NOT shower/wash with your normal soap after using and rinsing off  the CHG Soap.                9.  Pat yourself dry with a clean towel.            10.  Wear clean pajamas.            11.  Place clean sheets on your bed the night of your first shower and do not  sleep with pets. Day of Surgery : Do not apply any lotions/deodorants the morning of surgery.  Please wear clean clothes to the hospital/surgery center.  FAILURE TO FOLLOW THESE INSTRUCTIONS MAY RESULT IN THE CANCELLATION OF YOUR SURGERY PATIENT SIGNATURE_________________________________  NURSE SIGNATURE__________________________________  ________________________________________________________________________   Ruth [name]  An incentive spirometer is a tool that can help keep your lungs clear and active. This tool measures how well you are filling your lungs with each breath.  Taking long deep breaths may help reverse or decrease the chance of developing breathing (pulmonary) problems (especially infection) following:  A long period of time when you are unable to move or be active. BEFORE THE PROCEDURE   If the spirometer includes an indicator to show your best effort, your nurse or respiratory therapist will set it to a desired goal.  If possible, sit up straight or lean slightly forward. Try not to slouch.  Hold the incentive spirometer in an upright position. INSTRUCTIONS FOR USE  1. Sit on the edge of your bed if possible, or sit up as far as you can in bed or on a chair. 2. Hold the incentive spirometer in an upright position. 3. Breathe out normally. 4. Place the mouthpiece in your mouth and seal your lips tightly around it. 5. Breathe in slowly and as deeply as possible, raising the piston or the ball toward the top of the column. 6. Hold your breath for 3-5 seconds or for as long as possible. Allow the piston or ball to fall to the bottom of the column. 7. Remove the mouthpiece from your mouth and breathe out normally. 8. Rest for a few seconds and repeat Steps 1 through 7 at least 10 times every 1-2 hours when you are awake. Take your time and take a few normal breaths between deep breaths. 9. The spirometer may include an indicator to show your best effort. Use the indicator as a goal to work toward during each repetition. 10. After each set of 10 deep breaths, practice coughing to be sure your lungs are clear. If you have an incision (the cut made at the time of surgery), support your  incision when coughing by placing a pillow or rolled up towels firmly against it. Once you are able to get out of bed, walk around indoors and cough well. You may stop using the incentive spirometer when instructed by your caregiver.  RISKS AND COMPLICATIONS  Take your time so you do not get dizzy or light-headed.  If you are in pain, you may need to take or ask for pain  medication before doing incentive spirometry. It is harder to take a deep breath if you are having pain. AFTER USE  Rest and breathe slowly and easily.  It can be helpful to keep track of a log of your progress. Your caregiver can provide you with a simple table to help with this. If you are using the spirometer at home, follow these instructions: [place] IF:   You are having difficultly using the spirometer.  You have trouble using the spirometer as often as instructed.  Your pain medication is not giving enough relief while using the spirometer.  You develop fever of 100.5 F (38.1 C) or higher. SEEK IMMEDIATE MEDICAL CARE IF:   You cough up bloody sputum that had not been present before.  You develop fever of 102 F (38.9 C) or greater.  You develop worsening pain at or near the incision site. MAKE SURE YOU:   Understand these instructions.  Will watch your condition.  Will get help right away if you are not doing well or get worse. Document Released: 06/15/2006 Document Revised: 04/27/2011 Document Reviewed: 08/16/2006 [facility] Patient Information 2014 [place], [place].   ________________________________________________________________________

## 2018-02-01 NOTE — H&P (Signed)
TOTAL KNEE ADMISSION H&P  Patient is being admitted for left total knee arthroplasty.  Subjective:  Chief Complaint:left knee pain.  HPI: Ruth [name], 77 y.o. female, has a history of pain and functional disability in the left knee due to arthritis and has failed non-surgical conservative treatments for greater than 12 weeks to includecorticosteriod injections, viscosupplementation injections, use of assistive devices and activity modification.  Onset of symptoms was gradual, starting >10 years ago with gradually worsening course since that time. The patient noted prior procedures on the knee to include  arthroscopy on the left knee(s).  Patient currently rates pain in the left knee(s) at 10 out of 10 with activity. Patient has worsening of pain with activity and weight bearing, pain that interferes with activities of daily living and instability.  Patient has evidence of tri-compartmental bone-on-bone osteoarthritic changes by imaging studies. There is no active infection.  Patient Active Problem List   Diagnosis Date Noted  . Other allergic contact dermatitis 12/20/2017  . Non-allergic rhinitis 12/16/2017  . Allergy to metal 12/16/2017  . Gastroesophageal reflux disease without esophagitis 07/15/2017  . Primary osteoarthritis of both feet 04/29/2017  . Family history of early CAD 04/15/2016  . Primary osteoarthritis of both knees 12/31/2015  . Chest pain 07/13/2015  . Atypical chest pain 07/13/2015  . Major depression 03/19/2015  . Vulvodynia 08/03/2014  . Functional constipation 08/03/2014  . Anxiety 07/06/2014  . Fibromyalgia 04/25/2014  . Snoring 04/22/2014  . Prediabetes 03/18/2014  . Hyperlipidemia 03/18/2014  . Osteopenia 03/06/2014  . History of cardiac catheterization 04/28/2013  . Dyspepsia 02/02/2012  . GERD (gastroesophageal reflux disease) 02/02/2012  . Depression 06/18/2011  . Dysphagia 02/05/2011  . CHEST PAIN 02/11/2009  . GASTROESOPHAGEAL REFLUX DISEASE,  CHRONIC 02/15/2007  . IRRITABLE BOWEL SYNDROME 02/15/2007  . Primary osteoarthritis of both hands 02/15/2007  . Reflux esophagitis 02/14/2007  . GASTRITIS 02/14/2007   Past Medical History:  Diagnosis Date  . Anxiety   . Arthritis   . Asthma   . Depression   . Eczema   . Fibromyalgia   . GERD (gastroesophageal reflux disease)    EGD/ colon 1/09  . H pylori ulcer 1980-1990   s/p treatment  . Headache   . Hyperlipidemia   . IBS (irritable bowel syndrome)   . Neck pain   . OSA (obstructive sleep apnea)   . PONV (postoperative nausea and vomiting)   . Refusal of blood transfusions as patient is Jehovah's Witness   . Vertigo     Past Surgical History:  Procedure Laterality Date  . ABDOMINAL HYSTERECTOMY  1970  . APPENDECTOMY    . BILATERAL SALPINGOOPHORECTOMY  1990s  . CARDIAC CATHETERIZATION  2002   [facility]) normal coronary arteries   . COLONOSCOPY  01/2007   Dr. Meriel Flavors  . COLONOSCOPY N/A 04/18/2015   Procedure: COLONOSCOPY;  Surgeon: Rogene Houston, MD;  Location: [facility] SU[facility];  Service: Endoscopy;  Laterality: N/A;  830 - moved to 8:55 - Ann to notify pt  . COLONOSCOPY WITH ESOPHAGOGASTRODUODENOSCOPY (EGD)  02/16/2012   Procedure: COLONOSCOPY WITH ESOPHAGOGASTRODUODENOSCOPY (EGD);  Surgeon: Daneil Dolin, MD;  Location: [facility] SU[facility];  Service: Endoscopy;  Laterality: N/A;  8:45  . ESOPHAGEAL DILATION N/A 06/07/2014   Procedure: ESOPHAGEAL DILATION;  Surgeon: Rogene Houston, MD;  Location: [facility] SU[facility];  Service: Endoscopy;  Laterality: N/A;  . ESOPHAGOGASTRODUODENOSCOPY  01/2007   Dr Sharlett Iles- 3 cm hiatal hernia, benign esophageal biopsies, erosive esophagitis, gastritis  . ESOPHAGOGASTRODUODENOSCOPY N/A  06/07/2014   Procedure: ESOPHAGOGASTRODUODENOSCOPY (EGD);  Surgeon: Rogene Houston, MD;  Location: [facility];  Service: Endoscopy;  Laterality: N/A;  1200  . ESOPHAGOGASTRODUODENOSCOPY N/A 08/18/2017   Procedure: ESOPHAGOGASTRODUODENOSCOPY (EGD);  Surgeon:  Rogene Houston, MD;  Location: [facility];  Service: Endoscopy;  Laterality: N/A;  2:20  . FOOT SURGERY    . INNER EAR SURGERY     Left  . KNEE SURGERY     Multiple  . SHOULDER SURGERY  2011  . TONSILLECTOMY    . TUBAL LIGATION    . TYMPANOSTOMY TUBE PLACEMENT      No current facility-administered medications for this encounter.    Current Outpatient Medications  Medication Sig Dispense Refill Last Dose  . albuterol (PROVENTIL HFA;VENTOLIN HFA) 108 (90 Base) MCG/ACT inhaler Inhale 2 puffs into the lungs every 6 (six) hours as needed for wheezing. 1 Inhaler 2 Taking  . diazepam (VALIUM) 5 MG tablet 1 bid prn anxiety (Patient taking differently: Take 5 mg by mouth 2 (two) times daily as needed for anxiety. ) 30 tablet 1 Taking  . fluticasone (FLONASE) 50 MCG/ACT nasal spray Place 2 sprays into both nostrils daily as needed for allergies.    Taking  . guaifenesin (HUMIBID E) 400 MG TABS tablet Take 800 mg by mouth 3 (three) times daily.     . nitroGLYCERIN (NITROSTAT) 0.4 MG SL tablet Place 1 tablet (0.4 mg total) under the tongue every 5 (five) minutes as needed for chest pain. 20 tablet 0 Taking  . oxyCODONE-acetaminophen (PERCOCET/ROXICET) 5-325 MG tablet 1/2 to 1 q4 hours prn pain (Patient taking differently: Take 0.5-1 tablets by mouth every 4 (four) hours as needed for moderate pain or severe pain. ) 30 tablet 0   . Polyvinyl Alcohol (LUBRICANT DROPS OP) Place 2 drops into both eyes 3 (three) times daily as needed (for dry eyes).     Marland Kitchen b complex vitamins tablet Take 1 tablet by mouth daily.     . Cholecalciferol (VITAMIN D3 PO) Take 1 capsule by mouth daily.     . Coenzyme Q10 (COQ10 PO) Take 1 capsule by mouth daily.     Marland Kitchen dicyclomine (BENTYL) 10 MG capsule Take 1 capsule (10 mg total) by mouth 3 (three) times daily as needed. For stomach cramps (Patient not taking: Reported on 01/28/2018) 90 capsule 5 Not Taking at Unknown time  . FLUoxetine (PROZAC) 20 MG tablet Take 1 tablet (20  mg total) by mouth daily. (Patient not taking: Reported on 01/28/2018) 30 tablet 5 Not Taking at Unknown time  . mometasone (ELOCON) 0.1 % cream Apply BID prn (Patient not taking: Reported on 01/28/2018) 15 g 0 Completed Course at Unknown time  . vitamin C (ASCORBIC ACID) 500 MG tablet Take 500 mg by mouth 2 (two) times daily.    Taking   Allergies  Allergen Reactions  . Blood-Group Specific Substance Other (See Comments)    NO BLOOD PRODUCTS-refuses transfusions  . Ciprofloxacin Other (See Comments)    Pt reports extreme fatigue  . Codeine Nausea And Vomiting  . Latex Other (See Comments)    Red and raw area around incision  . Vicodin [Hydrocodone-Acetaminophen] Nausea And Vomiting  . Tramadol Other (See Comments)    Increased heart rate,increased blood pressure, edema    Social History   Tobacco Use  . Smoking status: Never Smoker  . Smokeless tobacco: Never Used  Substance Use Topics  . Alcohol use: Yes    Alcohol/week: 0.0 standard drinks  Comment: Occasionally, holidays/special occasions    Family History  Problem Relation Age of Onset  . Diabetes Father   . Heart attack Father        55's  . Lung cancer Father   . Cirrhosis Father 80       etoh cirrhosis  . Eczema Father   . Lung cancer Sister   . Depression Sister   . Alzheimer's disease Sister   . Hypertension Sister   . Diabetes Sister   . Lung cancer Sister   . Paranoid behavior Mother        depression  . Colon polyps Mother   . Eczema Mother   . Allergic rhinitis Neg Hx   . Asthma Neg Hx   . Urticaria Neg Hx      Review of Systems  Constitutional: Negative for chills and fever.  HENT: Negative for congestion, sore throat and tinnitus.   Eyes: Negative for double vision, photophobia and pain.  Respiratory: Negative for cough, shortness of breath and wheezing.   Cardiovascular: Negative for chest pain, palpitations and orthopnea.  Gastrointestinal: Negative for heartburn, nausea and vomiting.    Genitourinary: Negative for dysuria, frequency and urgency.  Musculoskeletal: Positive for joint pain.  Neurological: Negative for dizziness, weakness and headaches.    Objective:  Physical Exam  Well nourished and well developed.  General: Alert and oriented x3, cooperative and pleasant, no acute distress.  Head: normocephalic, atraumatic, neck supple.  Eyes: EOMI.  Respiratory: breath sounds clear in all fields, no wheezing, rales, or rhonchi. Cardiovascular: Regular rate and rhythm, no murmurs, gallops or rubs.  Abdomen: non-tender to palpation and soft, normoactive bowel sounds. Musculoskeletal: Right Knee Exam: No effusion. Range of motion is 15 degree flexion contracture-110 degrees. No crepitus on range of motion of the knee. Tenderness is medial greater than lateral. Stable knee. Calves soft and nontender. Motor function intact in LE. Strength 5/5 LE bilaterally. Neuro: Distal pulses 2+. Sensation to light touch intact in LE.  Vital signs in last 24 hours:  Blood pressure: 148/82 mmHg Pulse: 60 bpm  Labs:   Estimated body mass index is 36.78 kg/m as calculated from the following:   Height as of 01/19/18: 5\' 5"  (1.651 m).   Weight as of 01/19/18: 100.2 kg.   Imaging Review Plain radiographs demonstrate severe degenerative joint disease of the left knee(s). The overall alignment isneutral. The bone quality appears to be adequate for age and reported activity level.   Preoperative templating of the joint replacement has been completed, documented, and submitted to the Operating Room personnel in order to optimize intra-operative equipment management.   Anticipated LOS equal to or greater than 2 midnights due to - Age 81 and older with one or more of the following:  - Obesity  - Expected need for hospital services (PT, OT, Nursing) required for safe  discharge  - Anticipated need for postoperative skilled nursing care or inpatient rehab  - Active co-morbidities:  None OR   - Unanticipated findings during/Post Surgery: None  - Patient is a high risk of re-admission due to: None   Assessment/Plan:  End stage arthritis, left knee   The patient history, physical examination, clinical judgment of the provider and imaging studies are consistent with end stage degenerative joint disease of the left knee(s) and total knee arthroplasty is deemed medically necessary. The treatment options including medical management, injection therapy arthroscopy and arthroplasty were discussed at length. The risks and benefits of total knee arthroplasty were presented and  reviewed. The risks due to aseptic loosening, infection, stiffness, patella tracking problems, thromboembolic complications and other imponderables were discussed. The patient acknowledged the explanation, agreed to proceed with the plan and consent was signed. Patient is being admitted for inpatient treatment for surgery, pain control, PT, OT, prophylactic antibiotics, VTE prophylaxis, progressive ambulation and ADL's and discharge planning. The patient is planning to be discharged home.   Therapy Plans: HHPT then outpatient therapy at [place] vs outpatient Disposition: Home with husband Planned DVT Prophylaxis: Xarelto 10 mg daily (hx of gastric ulcer) DME needed: Gilford Rile, 3-n-1 PCP: Sallee Lange TXA: IV Allergies: Codeine (nausea), ciprofloxacin, adhesives Anesthesia Concerns: None BMI: 36.4  - Patient was instructed on what medications to stop prior to surgery. - Follow-up visit in 2 weeks with Dr. Wynelle Link - Begin physical therapy following surgery - Pre-operative lab work as pre-surgical testing - Prescriptions will be provided in hospital at time of discharge  Theresa Duty, PA-C Orthopedic Surgery [facility] [place]

## 2018-02-03 ENCOUNTER — Encounter (HOSPITAL_COMMUNITY)
Admission: RE | Admit: 2018-02-03 | Discharge: 2018-02-03 | Disposition: A | Discharge status: Home / Self Care | Payer: PPO | Source: Ambulatory Visit | Attending: Orthopedic Surgery | Admitting: Orthopedic Surgery

## 2018-02-03 ENCOUNTER — Encounter (HOSPITAL_COMMUNITY): Payer: Self-pay

## 2018-02-03 ENCOUNTER — Other Ambulatory Visit: Payer: Self-pay

## 2018-02-03 DIAGNOSIS — Z01818 Encounter for other preprocedural examination: Secondary | ICD-10-CM | POA: Diagnosis not present

## 2018-02-03 HISTORY — DX: Laryngeal spasm: J38.5

## 2018-02-03 LAB — COMPREHENSIVE METABOLIC PANEL
ALT: 17 U/L (ref 0–44)
AST: 18 U/L (ref 15–41)
Albumin: 4 g/dL (ref 3.5–5.0)
Alkaline Phosphatase: 64 U/L (ref 38–126)
Anion gap: 10 (ref 5–15)
BUN: 21 mg/dL (ref 8–23)
CO2: 25 mmol/L (ref 22–32)
Calcium: 9.3 mg/dL (ref 8.9–10.3)
Chloride: 105 mmol/L (ref 98–111)
Creatinine, Ser: 0.77 mg/dL (ref 0.44–1.00)
GFR calc Af Amer: >60 mL/min (ref >60)
GFR calc non Af Amer: >60 mL/min (ref >60)
Glucose, Bld: 97 mg/dL (ref 70–99)
Potassium: 4.5 mmol/L (ref 3.5–5.1)
Sodium: 140 mmol/L (ref 135–145)
Total Bilirubin: 0.4 mg/dL (ref 0.3–1.2)
Total Protein: 7 g/dL (ref 6.5–8.1)

## 2018-02-03 LAB — CBC
HCT: 40.6 % (ref 36.0–46.0)
Hemoglobin: 12.9 g/dL (ref 12.0–15.0)
MCH: 31.4 pg (ref 26.0–34.0)
MCHC: 31.8 g/dL (ref 30.0–36.0)
MCV: 98.8 fL (ref 80.0–100.0)
Platelets: 226 10*3/uL (ref 150–400)
RBC: 4.11 MIL/uL (ref 3.87–5.11)
RDW: 14 % (ref 11.5–15.5)
WBC: 5.3 10*3/uL (ref 4.0–10.5)
nRBC: 0 % (ref 0.0–0.2)

## 2018-02-03 LAB — SURGICAL PCR SCREEN
MRSA, PCR: NEGATIVE
Staphylococcus aureus: NEGATIVE

## 2018-02-03 LAB — PROTIME-INR
INR: 0.92
Prothrombin Time: 12.3 seconds (ref 11.4–15.2)

## 2018-02-03 LAB — APTT: aPTT: 29 seconds (ref 24–36)

## 2018-02-04 LAB — NO BLOOD PRODUCTS

## 2018-02-04 NOTE — Progress Notes (Signed)
Refusal of All Blood and/or Blood Products form faxed to Dr. Wynelle Link, and [facility]. Successful transmittals on chart

## 2018-02-06 MED ORDER — BUPIVACAINE LIPOSOME 1.3 % IJ SUSP
20.0000 mL | INTRAMUSCULAR | Status: DC
  Filled 2018-02-06: qty 20

## 2018-02-06 NOTE — Anesthesia Preprocedure Evaluation (Addendum)
Anesthesia Evaluation  Patient identified by MRN, date of birth, ID band Patient awake    Reviewed: Allergy & Precautions, H&P , NPO status , Patient's Chart, lab work & pertinent test results, reviewed documented beta blocker date and time   History of Anesthesia Complications (+) PONV and history of anesthetic complications  Airway Mallampati: II  TM Distance: >3 FB Neck ROM: full    Dental no notable dental hx.    Pulmonary neg pulmonary ROS,    Pulmonary exam normal breath sounds clear to auscultation       Cardiovascular Exercise Tolerance: Good negative cardio ROS   Rhythm:regular Rate:Normal  ECHO 18  Good EF   Neuro/Psych PSYCHIATRIC DISORDERS Anxiety Depression negative neurological ROS     GI/Hepatic Neg liver ROS, GERD  Medicated,  Endo/Other  negative endocrine ROS  Renal/GU negative Renal ROS  negative genitourinary   Musculoskeletal  (+) Arthritis , Osteoarthritis,  Fibromyalgia -  Abdominal   Peds  Hematology negative hematology ROS (+)   Anesthesia Other Findings Refusal of blood transfusions as patient is Jehovah's Witness  Reproductive/Obstetrics negative OB ROS                             Anesthesia Physical Anesthesia Plan  ASA: III  Anesthesia Plan: Spinal   Post-op Pain Management:  [facility]ional for Post-op pain   Induction:   PONV Risk Score and Plan: 2 and Ondansetron and Treatment may vary due to age or medical condition  Airway Management Planned: Nasal Cannula and Natural Airway  Additional Equipment:   Intra-op Plan:   Post-operative Plan:   Informed Consent: I have reviewed the patients History and Physical, chart, labs and discussed the procedure including the risks, benefits and alternatives for the proposed anesthesia with the patient or authorized representative who has indicated his/her understanding and acceptance.   Dental Advisory  Given  Plan Discussed with: CRNA, Anesthesiologist and Surgeon  Anesthesia Plan Comments: (  )       Anesthesia Quick Evaluation

## 2018-02-07 ENCOUNTER — Inpatient Hospital Stay (HOSPITAL_COMMUNITY)
Admission: RE | Admit: 2018-02-07 | Discharge: 2018-02-09 | DRG: 470 | Disposition: A | Discharge status: Home / Self Care | Payer: PPO | Attending: Orthopedic Surgery | Admitting: Orthopedic Surgery

## 2018-02-07 ENCOUNTER — Inpatient Hospital Stay (HOSPITAL_COMMUNITY): Payer: PPO | Admitting: Anesthesiology

## 2018-02-07 ENCOUNTER — Encounter (HOSPITAL_COMMUNITY)
Admission: RE | Disposition: A | Discharge status: Home / Self Care | Payer: Self-pay | Source: Home / Self Care | Attending: Orthopedic Surgery

## 2018-02-07 ENCOUNTER — Other Ambulatory Visit: Payer: Self-pay

## 2018-02-07 ENCOUNTER — Encounter (HOSPITAL_COMMUNITY): Payer: Self-pay | Admitting: *Deleted

## 2018-02-07 DIAGNOSIS — Z8619 Personal history of other infectious and parasitic diseases: Secondary | ICD-10-CM | POA: Diagnosis not present

## 2018-02-07 DIAGNOSIS — E669 Obesity, unspecified: Secondary | ICD-10-CM | POA: Diagnosis present

## 2018-02-07 DIAGNOSIS — F419 Anxiety disorder, unspecified: Secondary | ICD-10-CM | POA: Diagnosis not present

## 2018-02-07 DIAGNOSIS — L309 Dermatitis, unspecified: Secondary | ICD-10-CM | POA: Diagnosis not present

## 2018-02-07 DIAGNOSIS — Z9049 Acquired absence of other specified parts of digestive tract: Secondary | ICD-10-CM | POA: Diagnosis not present

## 2018-02-07 DIAGNOSIS — Z8249 Family history of ischemic heart disease and other diseases of the circulatory system: Secondary | ICD-10-CM | POA: Diagnosis not present

## 2018-02-07 DIAGNOSIS — M797 Fibromyalgia: Secondary | ICD-10-CM | POA: Diagnosis not present

## 2018-02-07 DIAGNOSIS — M170 Bilateral primary osteoarthritis of knee: Principal | ICD-10-CM | POA: Diagnosis present

## 2018-02-07 DIAGNOSIS — Z6837 Body mass index (BMI) 37.0-37.9, adult: Secondary | ICD-10-CM | POA: Diagnosis not present

## 2018-02-07 DIAGNOSIS — K219 Gastro-esophageal reflux disease without esophagitis: Secondary | ICD-10-CM | POA: Diagnosis not present

## 2018-02-07 DIAGNOSIS — M19071 Primary osteoarthritis, right ankle and foot: Secondary | ICD-10-CM | POA: Diagnosis present

## 2018-02-07 DIAGNOSIS — E785 Hyperlipidemia, unspecified: Secondary | ICD-10-CM | POA: Diagnosis not present

## 2018-02-07 DIAGNOSIS — Z79899 Other long term (current) drug therapy: Secondary | ICD-10-CM

## 2018-02-07 DIAGNOSIS — G8918 Other acute postprocedural pain: Secondary | ICD-10-CM | POA: Diagnosis not present

## 2018-02-07 DIAGNOSIS — M19072 Primary osteoarthritis, left ankle and foot: Secondary | ICD-10-CM | POA: Diagnosis not present

## 2018-02-07 DIAGNOSIS — Z801 Family history of malignant neoplasm of trachea, bronchus and lung: Secondary | ICD-10-CM

## 2018-02-07 DIAGNOSIS — Z9089 Acquired absence of other organs: Secondary | ICD-10-CM

## 2018-02-07 DIAGNOSIS — Z881 Allergy status to other antibiotic agents status: Secondary | ICD-10-CM

## 2018-02-07 DIAGNOSIS — M8580 Other specified disorders of bone density and structure, unspecified site: Secondary | ICD-10-CM | POA: Diagnosis present

## 2018-02-07 DIAGNOSIS — G4733 Obstructive sleep apnea (adult) (pediatric): Secondary | ICD-10-CM | POA: Diagnosis present

## 2018-02-07 DIAGNOSIS — Z833 Family history of diabetes mellitus: Secondary | ICD-10-CM

## 2018-02-07 DIAGNOSIS — Z90722 Acquired absence of ovaries, bilateral: Secondary | ICD-10-CM | POA: Diagnosis not present

## 2018-02-07 DIAGNOSIS — M1710 Unilateral primary osteoarthritis, unspecified knee: Secondary | ICD-10-CM

## 2018-02-07 DIAGNOSIS — M179 Osteoarthritis of knee, unspecified: Secondary | ICD-10-CM | POA: Diagnosis present

## 2018-02-07 DIAGNOSIS — M19042 Primary osteoarthritis, left hand: Secondary | ICD-10-CM | POA: Diagnosis not present

## 2018-02-07 DIAGNOSIS — Z8379 Family history of other diseases of the digestive system: Secondary | ICD-10-CM

## 2018-02-07 DIAGNOSIS — M25762 Osteophyte, left knee: Secondary | ICD-10-CM | POA: Diagnosis not present

## 2018-02-07 DIAGNOSIS — M1712 Unilateral primary osteoarthritis, left knee: Secondary | ICD-10-CM | POA: Diagnosis not present

## 2018-02-07 DIAGNOSIS — Z90710 Acquired absence of both cervix and uterus: Secondary | ICD-10-CM

## 2018-02-07 DIAGNOSIS — F329 Major depressive disorder, single episode, unspecified: Secondary | ICD-10-CM | POA: Diagnosis present

## 2018-02-07 DIAGNOSIS — K210 Gastro-esophageal reflux disease with esophagitis: Secondary | ICD-10-CM | POA: Diagnosis not present

## 2018-02-07 DIAGNOSIS — Z91040 Latex allergy status: Secondary | ICD-10-CM

## 2018-02-07 DIAGNOSIS — M19041 Primary osteoarthritis, right hand: Secondary | ICD-10-CM | POA: Diagnosis present

## 2018-02-07 DIAGNOSIS — Z811 Family history of alcohol abuse and dependence: Secondary | ICD-10-CM

## 2018-02-07 DIAGNOSIS — K589 Irritable bowel syndrome without diarrhea: Secondary | ICD-10-CM | POA: Diagnosis present

## 2018-02-07 DIAGNOSIS — Z8371 Family history of colonic polyps: Secondary | ICD-10-CM

## 2018-02-07 DIAGNOSIS — Z885 Allergy status to narcotic agent status: Secondary | ICD-10-CM

## 2018-02-07 DIAGNOSIS — Z820 Family history of epilepsy and other diseases of the nervous system: Secondary | ICD-10-CM

## 2018-02-07 DIAGNOSIS — J45909 Unspecified asthma, uncomplicated: Secondary | ICD-10-CM | POA: Diagnosis not present

## 2018-02-07 DIAGNOSIS — Z840 Family history of diseases of the skin and subcutaneous tissue: Secondary | ICD-10-CM

## 2018-02-07 DIAGNOSIS — Z7951 Long term (current) use of inhaled steroids: Secondary | ICD-10-CM

## 2018-02-07 DIAGNOSIS — Z818 Family history of other mental and behavioral disorders: Secondary | ICD-10-CM

## 2018-02-07 HISTORY — PX: TOTAL KNEE ARTHROPLASTY: SHX125

## 2018-02-07 SURGERY — ARTHROPLASTY, KNEE, TOTAL
Anesthesia: Spinal | Site: Knee | Laterality: Left

## 2018-02-07 MED ORDER — SODIUM CHLORIDE (PF) 0.9 % IJ SOLN
INTRAMUSCULAR | Status: DC | PRN
  Administered 2018-02-07: 60 mL

## 2018-02-07 MED ORDER — DIPHENHYDRAMINE HCL 12.5 MG/5ML PO ELIX: 12.5000 mg | ORAL_SOLUTION | ORAL | Status: DC | PRN

## 2018-02-07 MED ORDER — ONDANSETRON HCL 4 MG PO TABS
4.0000 mg | ORAL_TABLET | Freq: Four times a day (QID) | ORAL | Status: DC | PRN
  Administered 2018-02-08 – 2018-02-09 (×3): 4 mg via ORAL
  Filled 2018-02-07 (×3): qty 1

## 2018-02-07 MED ORDER — RIVAROXABAN 10 MG PO TABS: 10.0000 mg | ORAL_TABLET | Freq: Every day | ORAL | 0 refills | Status: DC

## 2018-02-07 MED ORDER — SODIUM CHLORIDE 0.9 % IR SOLN
Status: DC | PRN
  Administered 2018-02-07: 1000 mL

## 2018-02-07 MED ORDER — PHENOL 1.4 % MT LIQD: 1.0000 | OROMUCOSAL | Status: DC | PRN

## 2018-02-07 MED ORDER — ONDANSETRON HCL 4 MG/2ML IJ SOLN
INTRAMUSCULAR | Status: DC | PRN
  Administered 2018-02-07: 4 mg via INTRAVENOUS

## 2018-02-07 MED ORDER — DEXAMETHASONE SODIUM PHOSPHATE 10 MG/ML IJ SOLN
10.0000 mg | Freq: Once | INTRAMUSCULAR | Status: AC
  Administered 2018-02-08: 10 mg via INTRAVENOUS
  Filled 2018-02-07: qty 1

## 2018-02-07 MED ORDER — GUAIFENESIN ER 600 MG PO TB12
600.0000 mg | ORAL_TABLET | Freq: Two times a day (BID) | ORAL | Status: DC
  Administered 2018-02-07 – 2018-02-09 (×4): 600 mg via ORAL
  Filled 2018-02-07 (×4): qty 1

## 2018-02-07 MED ORDER — CEFAZOLIN SODIUM-DEXTROSE 2-4 GM/100ML-% IV SOLN
2.0000 g | INTRAVENOUS | Status: AC
  Administered 2018-02-07: 2 g via INTRAVENOUS
  Filled 2018-02-07: qty 100

## 2018-02-07 MED ORDER — FLEET ENEMA 7-19 GM/118ML RE ENEM: 1.0000 | ENEMA | Freq: Once | RECTAL | Status: DC | PRN

## 2018-02-07 MED ORDER — CEFAZOLIN SODIUM-DEXTROSE 2-4 GM/100ML-% IV SOLN
2.0000 g | Freq: Four times a day (QID) | INTRAVENOUS | Status: AC
  Administered 2018-02-07 (×2): 2 g via INTRAVENOUS
  Filled 2018-02-07 (×2): qty 100

## 2018-02-07 MED ORDER — ACETAMINOPHEN 10 MG/ML IV SOLN
1000.0000 mg | Freq: Once | INTRAVENOUS | Status: AC
  Administered 2018-02-07: 1000 mg via INTRAVENOUS
  Filled 2018-02-07: qty 100

## 2018-02-07 MED ORDER — CHLORHEXIDINE GLUCONATE 4 % EX LIQD: 60.0000 mL | Freq: Once | CUTANEOUS | Status: DC

## 2018-02-07 MED ORDER — FLUTICASONE PROPIONATE 50 MCG/ACT NA SUSP: 2.0000 | Freq: Every day | NASAL | Status: DC | PRN

## 2018-02-07 MED ORDER — PROPOFOL 500 MG/50ML IV EMUL
INTRAVENOUS | Status: DC | PRN
  Administered 2018-02-07: 40 ug/kg/min via INTRAVENOUS

## 2018-02-07 MED ORDER — METHOCARBAMOL 500 MG PO TABS: 500.0000 mg | ORAL_TABLET | Freq: Four times a day (QID) | ORAL | 0 refills | Status: DC | PRN

## 2018-02-07 MED ORDER — MIDAZOLAM HCL 5 MG/5ML IJ SOLN
INTRAMUSCULAR | Status: DC | PRN
  Administered 2018-02-07: 2 mg via INTRAVENOUS

## 2018-02-07 MED ORDER — SODIUM CHLORIDE (PF) 0.9 % IJ SOLN
INTRAMUSCULAR | Status: AC
  Filled 2018-02-07: qty 50

## 2018-02-07 MED ORDER — MORPHINE SULFATE (PF) 2 MG/ML IV SOLN
1.0000 mg | INTRAVENOUS | Status: DC | PRN
  Administered 2018-02-07 – 2018-02-08 (×2): 2 mg via INTRAVENOUS
  Filled 2018-02-07 (×3): qty 1

## 2018-02-07 MED ORDER — ACETAMINOPHEN 500 MG PO TABS
1000.0000 mg | ORAL_TABLET | Freq: Four times a day (QID) | ORAL | Status: AC
  Administered 2018-02-07 – 2018-02-08 (×4): 1000 mg via ORAL
  Filled 2018-02-07 (×4): qty 2

## 2018-02-07 MED ORDER — METOCLOPRAMIDE HCL 5 MG PO TABS
5.0000 mg | ORAL_TABLET | Freq: Three times a day (TID) | ORAL | Status: DC | PRN
  Administered 2018-02-09: 5 mg via ORAL
  Filled 2018-02-07: qty 1

## 2018-02-07 MED ORDER — PROPOFOL 10 MG/ML IV BOLUS
INTRAVENOUS | Status: AC
  Filled 2018-02-07: qty 40

## 2018-02-07 MED ORDER — OXYCODONE HCL 5 MG PO TABS: 5.0000 mg | ORAL_TABLET | Freq: Four times a day (QID) | ORAL | 0 refills | Status: DC | PRN

## 2018-02-07 MED ORDER — POLYETHYLENE GLYCOL 3350 17 G PO PACK: 17.0000 g | PACK | Freq: Every day | ORAL | Status: DC | PRN

## 2018-02-07 MED ORDER — FENTANYL CITRATE (PF) 100 MCG/2ML IJ SOLN
50.0000 ug | INTRAMUSCULAR | Status: DC
  Administered 2018-02-07: 100 ug via INTRAVENOUS
  Filled 2018-02-07: qty 2

## 2018-02-07 MED ORDER — FENTANYL CITRATE (PF) 100 MCG/2ML IJ SOLN: 25.0000 ug | INTRAMUSCULAR | Status: DC | PRN

## 2018-02-07 MED ORDER — NITROGLYCERIN 0.4 MG SL SUBL: 0.4000 mg | SUBLINGUAL_TABLET | SUBLINGUAL | Status: DC | PRN

## 2018-02-07 MED ORDER — STERILE WATER FOR IRRIGATION IR SOLN
Status: DC | PRN
  Administered 2018-02-07: 2000 mL

## 2018-02-07 MED ORDER — ROPIVACAINE HCL 7.5 MG/ML IJ SOLN
INTRAMUSCULAR | Status: DC | PRN
  Administered 2018-02-07: 30 mL via PERINEURAL

## 2018-02-07 MED ORDER — DOCUSATE SODIUM 100 MG PO CAPS
100.0000 mg | ORAL_CAPSULE | Freq: Two times a day (BID) | ORAL | Status: DC
  Administered 2018-02-07 – 2018-02-09 (×4): 100 mg via ORAL
  Filled 2018-02-07 (×4): qty 1

## 2018-02-07 MED ORDER — METHOCARBAMOL 500 MG IVPB - SIMPLE MED
500.0000 mg | Freq: Four times a day (QID) | INTRAVENOUS | Status: DC | PRN
  Filled 2018-02-07: qty 50

## 2018-02-07 MED ORDER — DEXAMETHASONE SODIUM PHOSPHATE 10 MG/ML IJ SOLN
8.0000 mg | Freq: Once | INTRAMUSCULAR | Status: AC
  Administered 2018-02-07: 8 mg via INTRAVENOUS

## 2018-02-07 MED ORDER — PHENYLEPHRINE 40 MCG/ML (10ML) SYRINGE FOR IV PUSH (FOR BLOOD PRESSURE SUPPORT)
PREFILLED_SYRINGE | INTRAVENOUS | Status: DC | PRN
  Administered 2018-02-07: 80 ug via INTRAVENOUS

## 2018-02-07 MED ORDER — PANTOPRAZOLE SODIUM 40 MG PO TBEC
40.0000 mg | DELAYED_RELEASE_TABLET | Freq: Two times a day (BID) | ORAL | Status: DC
  Administered 2018-02-07 – 2018-02-09 (×4): 40 mg via ORAL
  Filled 2018-02-07 (×4): qty 1

## 2018-02-07 MED ORDER — PHENYLEPHRINE 40 MCG/ML (10ML) SYRINGE FOR IV PUSH (FOR BLOOD PRESSURE SUPPORT)
PREFILLED_SYRINGE | INTRAVENOUS | Status: AC
  Filled 2018-02-07: qty 10

## 2018-02-07 MED ORDER — LACTATED RINGERS IV SOLN
INTRAVENOUS | Status: DC
  Administered 2018-02-07: 07:00:00 via INTRAVENOUS

## 2018-02-07 MED ORDER — ONDANSETRON HCL 4 MG/2ML IJ SOLN
4.0000 mg | Freq: Four times a day (QID) | INTRAMUSCULAR | Status: DC | PRN
  Administered 2018-02-07 – 2018-02-08 (×2): 4 mg via INTRAVENOUS
  Filled 2018-02-07 (×3): qty 2

## 2018-02-07 MED ORDER — SODIUM CHLORIDE (PF) 0.9 % IJ SOLN
INTRAMUSCULAR | Status: AC
  Filled 2018-02-07: qty 10

## 2018-02-07 MED ORDER — ONDANSETRON HCL 4 MG/2ML IJ SOLN
INTRAMUSCULAR | Status: AC
  Filled 2018-02-07: qty 2

## 2018-02-07 MED ORDER — MEPERIDINE HCL 50 MG/ML IJ SOLN: 6.2500 mg | INTRAMUSCULAR | Status: DC | PRN

## 2018-02-07 MED ORDER — OXYCODONE HCL 5 MG PO TABS
10.0000 mg | ORAL_TABLET | ORAL | Status: DC | PRN
  Administered 2018-02-07 – 2018-02-08 (×2): 10 mg via ORAL
  Administered 2018-02-08 – 2018-02-09 (×3): 15 mg via ORAL
  Administered 2018-02-09: 10 mg via ORAL
  Administered 2018-02-09: 15 mg via ORAL
  Filled 2018-02-07 (×4): qty 3
  Filled 2018-02-07: qty 2
  Filled 2018-02-07: qty 3

## 2018-02-07 MED ORDER — MIDAZOLAM HCL 2 MG/2ML IJ SOLN
1.0000 mg | INTRAMUSCULAR | Status: DC
  Administered 2018-02-07: 2 mg via INTRAVENOUS
  Filled 2018-02-07: qty 2

## 2018-02-07 MED ORDER — TRANEXAMIC ACID-NACL 1000-0.7 MG/100ML-% IV SOLN
1000.0000 mg | INTRAVENOUS | Status: AC
  Administered 2018-02-07: 1000 mg via INTRAVENOUS
  Filled 2018-02-07: qty 100

## 2018-02-07 MED ORDER — RIVAROXABAN 10 MG PO TABS
10.0000 mg | ORAL_TABLET | Freq: Every day | ORAL | Status: DC
  Administered 2018-02-08 – 2018-02-09 (×2): 10 mg via ORAL
  Filled 2018-02-07 (×2): qty 1

## 2018-02-07 MED ORDER — BISACODYL 10 MG RE SUPP: 10.0000 mg | Freq: Every day | RECTAL | Status: DC | PRN

## 2018-02-07 MED ORDER — OXYCODONE HCL 5 MG PO TABS
5.0000 mg | ORAL_TABLET | ORAL | Status: DC | PRN
  Administered 2018-02-07: 5 mg via ORAL
  Administered 2018-02-08 (×3): 10 mg via ORAL
  Filled 2018-02-07 (×4): qty 2
  Filled 2018-02-07: qty 1

## 2018-02-07 MED ORDER — DIAZEPAM 5 MG PO TABS
5.0000 mg | ORAL_TABLET | Freq: Two times a day (BID) | ORAL | Status: DC | PRN
  Administered 2018-02-08: 5 mg via ORAL
  Filled 2018-02-07: qty 1

## 2018-02-07 MED ORDER — MENTHOL 3 MG MT LOZG: 1.0000 | LOZENGE | OROMUCOSAL | Status: DC | PRN

## 2018-02-07 MED ORDER — TRANEXAMIC ACID-NACL 1000-0.7 MG/100ML-% IV SOLN
1000.0000 mg | Freq: Once | INTRAVENOUS | Status: AC
  Administered 2018-02-07: 1000 mg via INTRAVENOUS
  Filled 2018-02-07: qty 100

## 2018-02-07 MED ORDER — METHOCARBAMOL 500 MG PO TABS
500.0000 mg | ORAL_TABLET | Freq: Four times a day (QID) | ORAL | Status: DC | PRN
  Administered 2018-02-07 – 2018-02-09 (×4): 500 mg via ORAL
  Filled 2018-02-07 (×4): qty 1

## 2018-02-07 MED ORDER — DEXAMETHASONE SODIUM PHOSPHATE 10 MG/ML IJ SOLN
INTRAMUSCULAR | Status: AC
  Filled 2018-02-07: qty 1

## 2018-02-07 MED ORDER — BUPIVACAINE LIPOSOME 1.3 % IJ SUSP
INTRAMUSCULAR | Status: DC | PRN
  Administered 2018-02-07: 20 mL

## 2018-02-07 MED ORDER — MIDAZOLAM HCL 2 MG/2ML IJ SOLN
INTRAMUSCULAR | Status: AC
  Filled 2018-02-07: qty 2

## 2018-02-07 MED ORDER — SODIUM CHLORIDE 0.9 % IV SOLN
INTRAVENOUS | Status: DC
  Administered 2018-02-07: 12:00:00 via INTRAVENOUS

## 2018-02-07 MED ORDER — METOCLOPRAMIDE HCL 5 MG/ML IJ SOLN
5.0000 mg | Freq: Three times a day (TID) | INTRAMUSCULAR | Status: DC | PRN
  Administered 2018-02-07 – 2018-02-08 (×2): 10 mg via INTRAVENOUS
  Filled 2018-02-07 (×3): qty 2

## 2018-02-07 MED ORDER — ALBUTEROL SULFATE (2.5 MG/3ML) 0.083% IN NEBU: 2.5000 mg | INHALATION_SOLUTION | Freq: Four times a day (QID) | RESPIRATORY_TRACT | Status: DC | PRN

## 2018-02-07 SURGICAL SUPPLY — 62 items
BAG ZIPLOCK 12X15 (MISCELLANEOUS) ×2 IMPLANT
BANDAGE ACE 6X5 VEL STRL LF (GAUZE/BANDAGES/DRESSINGS) ×2 IMPLANT
BLADE SAG 18X100X1.27 (BLADE) ×2 IMPLANT
BLADE SAW SGTL 11.0X1.19X90.0M (BLADE) ×2 IMPLANT
BLADE SURG SZ10 CARB STEEL (BLADE) ×4 IMPLANT
BOWL SMART MIX CTS (DISPOSABLE) ×2 IMPLANT
CEMENT HV SMART SET (Cement) ×4 IMPLANT
CEMENT TIBIA MBT (Knees) ×1 IMPLANT
COVER SURGICAL LIGHT HANDLE (MISCELLANEOUS) ×2 IMPLANT
COVER WAND RF STERILE (DRAPES) ×2 IMPLANT
CUFF TOURN SGL QUICK 34 (TOURNIQUET CUFF) ×1
CUFF TRNQT CYL 34X4X40X1 (TOURNIQUET CUFF) ×1 IMPLANT
DECANTER SPIKE VIAL GLASS SM (MISCELLANEOUS) ×4 IMPLANT
DRAPE U-SHAPE 47X51 STRL (DRAPES) ×2 IMPLANT
DRSG ADAPTIC 3X8 NADH LF (GAUZE/BANDAGES/DRESSINGS) ×2 IMPLANT
DRSG PAD ABDOMINAL 8X10 ST (GAUZE/BANDAGES/DRESSINGS) ×2 IMPLANT
DURAPREP 26ML APPLICATOR (WOUND CARE) ×2 IMPLANT
ELECT REM PT RETURN 15FT ADLT (MISCELLANEOUS) ×2 IMPLANT
EVACUATOR 1/8 PVC DRAIN (DRAIN) ×2 IMPLANT
GAUZE SPONGE 4X4 12PLY STRL (GAUZE/BANDAGES/DRESSINGS) ×2 IMPLANT
GLOVE BIO SURGEON STRL SZ7 (GLOVE) ×2 IMPLANT
GLOVE BIO SURGEON STRL SZ8 (GLOVE) IMPLANT
GLOVE BIOGEL PI IND STRL 6.5 (GLOVE) ×1 IMPLANT
GLOVE BIOGEL PI IND STRL 7.0 (GLOVE) ×3 IMPLANT
GLOVE BIOGEL PI IND STRL 7.5 (GLOVE) ×3 IMPLANT
GLOVE BIOGEL PI IND STRL 8 (GLOVE) ×1 IMPLANT
GLOVE BIOGEL PI INDICATOR 6.5 (GLOVE) ×1
GLOVE BIOGEL PI INDICATOR 7.0 (GLOVE) ×3
GLOVE BIOGEL PI INDICATOR 7.5 (GLOVE) ×3
GLOVE BIOGEL PI INDICATOR 8 (GLOVE) ×1
GLOVE SURG SS PI 6.5 STRL IVOR (GLOVE) ×2 IMPLANT
GLOVE SURG SS PI 8.0 STRL IVOR (GLOVE) ×2 IMPLANT
GOWN STRL REUS W/TWL LRG LVL3 (GOWN DISPOSABLE) ×2 IMPLANT
GOWN STRL REUS W/TWL XL LVL3 (GOWN DISPOSABLE) ×6 IMPLANT
HANDPIECE INTERPULSE COAX TIP (DISPOSABLE) ×1
HOLDER FOLEY CATH W/STRAP (MISCELLANEOUS) ×2 IMPLANT
IMMOBILIZER KNEE 20 (SOFTGOODS) ×2
IMMOBILIZER KNEE 20 THIGH 36 (SOFTGOODS) ×1 IMPLANT
IMPL FEMUR SIGMA LT PS SZ 3 (Knees) ×1 IMPLANT
IMPLANT FEMUR SIGMA LT PS SZ 3 (Knees) ×2 IMPLANT
INSERT TIBIAL PFC SIG SZ3 10MM (Knees) ×2 IMPLANT
MANIFOLD NEPTUNE II (INSTRUMENTS) ×2 IMPLANT
NS IRRIG 1000ML POUR BTL (IV SOLUTION) ×2 IMPLANT
PACK TOTAL KNEE CUSTOM (KITS) ×2 IMPLANT
PAD ABD 8X10 STRL (GAUZE/BANDAGES/DRESSINGS) ×2 IMPLANT
PADDING CAST COTTON 6X4 STRL (CAST SUPPLIES) ×2 IMPLANT
PATELLA DOME PFC 35MM (Knees) ×2 IMPLANT
PIN STEINMAN FIXATION KNEE (PIN) ×2 IMPLANT
PROTECTOR NERVE ULNAR (MISCELLANEOUS) ×2 IMPLANT
SET HNDPC FAN SPRY TIP SCT (DISPOSABLE) ×1 IMPLANT
STRIP CLOSURE SKIN 1/2X4 (GAUZE/BANDAGES/DRESSINGS) ×2 IMPLANT
SUT MNCRL AB 4-0 PS2 18 (SUTURE) ×2 IMPLANT
SUT STRATAFIX 0 PDS 27 VIOLET (SUTURE) ×2
SUT VIC AB 2-0 CT1 27 (SUTURE) ×3
SUT VIC AB 2-0 CT1 TAPERPNT 27 (SUTURE) ×3 IMPLANT
SUTURE STRATFX 0 PDS 27 VIOLET (SUTURE) ×1 IMPLANT
TIBIA MBT CEMENT (Knees) ×2 IMPLANT
TRAY FOLEY METER SIL LF 16FR (CATHETERS) ×2 IMPLANT
TRAY FOLEY MTR SLVR 16FR STAT (SET/KITS/TRAYS/PACK) ×2 IMPLANT
WATER STERILE IRR 1000ML POUR (IV SOLUTION) ×4 IMPLANT
WRAP KNEE MAXI GEL POST OP (GAUZE/BANDAGES/DRESSINGS) ×2 IMPLANT
YANKAUER SUCT BULB TIP 10FT TU (MISCELLANEOUS) ×2 IMPLANT

## 2018-02-07 NOTE — Progress Notes (Signed)
Assisted Dr. Oddono with left, ultrasound guided, adductor canal block. Side rails up, monitors on throughout procedure. See vital signs in flow sheet. Tolerated Procedure well.  

## 2018-02-07 NOTE — Interval H&P Note (Signed)
History and Physical Interval Note:  02/07/2018 7:20 AM  Ruth [name]  has presented today for surgery, with the diagnosis of left knee osteoarthritis  The various methods of treatment have been discussed with the patient and family. After consideration of risks, benefits and other options for treatment, the patient has consented to  Procedure(s) with comments: TOTAL KNEE ARTHROPLASTY (Left) - 32min as a surgical intervention .  The patient's history has been reviewed, patient examined, no change in status, stable for surgery.  I have reviewed the patient's chart and labs.  Questions were answered to the patient's satisfaction.     Pilar Plate Adlai Nieblas

## 2018-02-07 NOTE — Anesthesia Postprocedure Evaluation (Signed)
Anesthesia Post Note  Patient: Ruth [name]  Procedure(s) Performed: TOTAL KNEE ARTHROPLASTY (Left Knee)     Patient location during evaluation: PACU Anesthesia Type: Spinal Level of consciousness: oriented and awake and alert Pain management: pain level controlled Vital Signs Assessment: post-procedure vital signs reviewed and stable Respiratory status: spontaneous breathing, respiratory function stable and patient connected to nasal cannula oxygen Cardiovascular status: blood pressure returned to baseline and stable Postop Assessment: no headache, no backache and no apparent nausea or vomiting Anesthetic complications: no    Last Vitals:  Vitals:   02/07/18 1200 02/07/18 1303  BP: (!) 130/51 (!) 118/58  Pulse: 78 70  Resp:    Temp: 36.4 C 36.4 C  SpO2: 97% 98%    Last Pain:  Vitals:   02/07/18 1303  TempSrc: Oral  PainSc:                  Kiren Mcisaac

## 2018-02-07 NOTE — Discharge Instructions (Addendum)
° °Dr. Frank Aluisio °Total Joint Specialist °[facility] °[address] °[place], [place] [address] °[phone] ° °TOTAL KNEE REPLACEMENT POSTOPERATIVE DIRECTIONS ° °Knee Rehabilitation, Guidelines Following Surgery  °Results after knee surgery are often greatly improved when you follow the exercise, range of motion and muscle strengthening exercises prescribed by your doctor. Safety measures are also important to protect the knee from further injury. Any time any of these exercises cause you to have increased pain or swelling in your knee joint, decrease the amount until you are comfortable again and slowly increase them. If you have problems or questions, call your caregiver or physical therapist for advice.  ° °HOME CARE INSTRUCTIONS  °• Remove items at home which could result in a fall. This includes throw rugs or furniture in walking pathways.  °· ICE to the affected knee every three hours for 30 minutes at a time and then as needed for pain and swelling.  Continue to use ice on the knee for pain and swelling from surgery. You may notice swelling that will progress down to the foot and ankle.  This is normal after surgery.  Elevate the leg when you are not up walking on it.   °· Continue to use the breathing machine which will help keep your temperature down.  It is common for your temperature to cycle up and down following surgery, especially at night when you are not up moving around and exerting yourself.  The breathing machine keeps your lungs expanded and your temperature down. °· Do not place pillow under knee, focus on keeping the knee straight while resting ° °DIET °You may resume your previous home diet once your are discharged from the hospital. ° °DRESSING / WOUND CARE / SHOWERING °You may shower 3 days after surgery, but keep the wounds dry during showering.  You may use an occlusive plastic wrap (Press'n Seal for example), NO SOAKING/SUBMERGING IN THE BATHTUB.  If the bandage  gets wet, change with a clean dry gauze.  If the incision gets wet, pat the wound dry with a clean towel. °You may start showering once you are discharged home but do not submerge the incision under water. Just pat the incision dry and apply a dry gauze dressing on daily. °Change the surgical dressing daily and reapply a dry dressing each time. ° °ACTIVITY °Walk with your walker as instructed. °Use walker as long as suggested by your caregivers. °Avoid periods of inactivity such as sitting longer than an hour when not asleep. This helps prevent blood clots.  °You may resume a sexual relationship in one month or when given the OK by your doctor.  °You may return to work once you are cleared by your doctor.  °Do not drive a car for 6 weeks or until released by you surgeon.  °Do not drive while taking narcotics. ° °WEIGHT BEARING °Weight bearing as tolerated with assist device (walker, cane, etc) as directed, use it as long as suggested by your surgeon or therapist, typically at least 4-6 weeks. ° °POSTOPERATIVE CONSTIPATION PROTOCOL °Constipation - defined medically as fewer than three stools per week and severe constipation as less than one stool per week. ° °One of the most common issues patients have following surgery is constipation.  Even if you have a regular bowel pattern at home, your normal regimen is likely to be disrupted due to multiple reasons following surgery.  Combination of anesthesia, postoperative narcotics, change in appetite and fluid intake all can affect your bowels.    In order to avoid complications following surgery, here are some recommendations in order to help you during your recovery period. ° °Colace (docusate) - Pick up an over-the-counter form of Colace or another stool softener and take twice a day as long as you are requiring postoperative pain medications.  Take with a full glass of water daily.  If you experience loose stools or diarrhea, hold the colace until you stool forms back  up.  If your symptoms do not get better within 1 week or if they get worse, check with your doctor. ° °Dulcolax (bisacodyl) - Pick up over-the-counter and take as directed by the product packaging as needed to assist with the movement of your bowels.  Take with a full glass of water.  Use this product as needed if not relieved by Colace only.  ° °MiraLax (polyethylene glycol) - Pick up over-the-counter to have on hand.  MiraLax is a solution that will increase the amount of water in your bowels to assist with bowel movements.  Take as directed and can mix with a glass of water, juice, soda, coffee, or tea.  Take if you go more than two days without a movement. °Do not use MiraLax more than once per day. Call your doctor if you are still constipated or irregular after using this medication for 7 days in a row. ° °If you continue to have problems with postoperative constipation, please contact the office for further assistance and recommendations.  If you experience "the worst abdominal pain ever" or develop nausea or vomiting, please contact the office immediatly for further recommendations for treatment. ° °ITCHING ° If you experience itching with your medications, try taking only a single pain pill, or even half a pain pill at a time.  You can also use Benadryl over the counter for itching or also to help with sleep.  ° °TED HOSE STOCKINGS °Wear the elastic stockings on both legs for three weeks following surgery during the day but you may remove then at night for sleeping. ° °MEDICATIONS °See your medication summary on the “After Visit Summary” that the nursing staff will review with you prior to discharge.  You may have some home medications which will be placed on hold until you complete the course of blood thinner medication.  It is important for you to complete the blood thinner medication as prescribed by your surgeon.  Continue your approved medications as instructed at time of discharge. ° °PRECAUTIONS °If  you experience chest pain or shortness of breath - call 911 immediately for transfer to the hospital emergency department.  °If you develop a fever greater that 101 F, purulent drainage from wound, increased redness or drainage from wound, foul odor from the wound/dressing, or calf pain - CONTACT YOUR SURGEON.   °                                                °FOLLOW-UP APPOINTMENTS °Make sure you keep all of your appointments after your operation with your surgeon and caregivers. You should call the office at the above phone number and make an appointment for approximately two weeks after the date of your surgery or on the date instructed by your surgeon outlined in the "After Visit Summary". ° ° °RANGE OF MOTION AND STRENGTHENING EXERCISES  °Rehabilitation of the knee is important following a knee injury or   an operation. After just a few days of immobilization, the muscles of the thigh which control the knee become weakened and shrink (atrophy). Knee exercises are designed to build up the tone and strength of the thigh muscles and to improve knee motion. Often times heat used for twenty to thirty minutes before working out will loosen up your tissues and help with improving the range of motion but do not use heat for the first two weeks following surgery. These exercises can be done on a training (exercise) mat, on the floor, on a table or on a bed. Use what ever works the best and is most comfortable for you Knee exercises include:  °• Leg Lifts - While your knee is still immobilized in a splint or cast, you can do straight leg raises. Lift the leg to 60 degrees, hold for 3 sec, and slowly lower the leg. Repeat 10-20 times 2-3 times daily. Perform this exercise against resistance later as your knee gets better.  °• Quad and Hamstring Sets - Tighten up the muscle on the front of the thigh (Quad) and hold for 5-10 sec. Repeat this 10-20 times hourly. Hamstring sets are done by pushing the foot backward against an  object and holding for 5-10 sec. Repeat as with quad sets.  °· Leg Slides: Lying on your back, slowly slide your foot toward your buttocks, bending your knee up off the floor (only go as far as is comfortable). Then slowly slide your foot back down until your leg is flat on the floor again. °· Angel Wings: Lying on your back spread your legs to the side as far apart as you can without causing discomfort.  °A rehabilitation program following serious knee injuries can speed recovery and prevent re-injury in the future due to weakened muscles. Contact your doctor or a physical therapist for more information on knee rehabilitation.  ° °IF YOU ARE TRANSFERRED TO A SKILLED REHAB FACILITY °If the patient is transferred to a skilled rehab facility following release from the hospital, a list of the current medications will be sent to the facility for the patient to continue.  When discharged from the skilled rehab facility, please have the facility set up the patient's Home Health Physical Therapy prior to being released. Also, the skilled facility will be responsible for providing the patient with their medications at time of release from the facility to include their pain medication, the muscle relaxants, and their blood thinner medication. If the patient is still at the rehab facility at time of the two week follow up appointment, the skilled rehab facility will also need to assist the patient in arranging follow up appointment in our office and any transportation needs. ° °MAKE SURE YOU:  °• Understand these instructions.  °• Get help right away if you are not doing well or get worse.  ° ° °Pick up stool softner and laxative for home use following surgery while on pain medications. °Do not submerge incision under water. °Please use good hand washing techniques while changing dressing each day. °May shower starting three days after surgery. °Please use a clean towel to pat the incision dry following showers. °Continue to  use ice for pain and swelling after surgery. °Do not use any lotions or creams on the incision until instructed by your surgeon. ° °Information on my medicine - XARELTO® (Rivaroxaban) ° °This medication education was reviewed with me or my healthcare representative as part of my discharge preparation.  The pharmacist that spoke with me   during my hospital stay was:   ° °Why was Xarelto® prescribed for you? °Xarelto® was prescribed for you to reduce the risk of blood clots forming after orthopedic surgery. The medical term for these abnormal blood clots is venous thromboembolism (VTE). ° °What do you need to know about xarelto® ? °Take your Xarelto® ONCE DAILY at the same time every day. °You may take it either with or without food. ° °If you have difficulty swallowing the tablet whole, you may crush it and mix in applesauce just prior to taking your dose. ° °Take Xarelto® exactly as prescribed by your doctor and DO NOT stop taking Xarelto® without talking to the doctor who prescribed the medication.  Stopping without other VTE prevention medication to take the place of Xarelto® may increase your risk of developing a clot. ° °After discharge, you should have regular check-up appointments with your healthcare provider that is prescribing your Xarelto®.   ° °What do you do if you miss a dose? °If you miss a dose, take it as soon as you remember on the same day then continue your regularly scheduled once daily regimen the next day. Do not take two doses of Xarelto® on the same day.  ° °Important Safety Information °A possible side effect of Xarelto® is bleeding. You should call your healthcare provider right away if you experience any of the following: °? Bleeding from an injury or your nose that does not stop. °? Unusual colored urine (red or dark brown) or unusual colored stools (red or black). °? Unusual bruising for unknown reasons. °? A serious fall or if you hit your head (even if there is no bleeding). ° °Some  medicines may interact with Xarelto® and might increase your risk of bleeding while on Xarelto®. To help avoid this, consult your healthcare provider or pharmacist prior to using any new prescription or non-prescription medications, including herbals, vitamins, non-steroidal anti-inflammatory drugs (NSAIDs) and supplements. ° °This website has more information on Xarelto®: www.xarelto.com. ° ° ° ° °

## 2018-02-07 NOTE — Anesthesia Procedure Notes (Signed)
Anesthesia Regional Block: Adductor canal block   Pre-Anesthetic Checklist: ,, timeout performed, Correct Patient, Correct Site, Correct Laterality, Correct Procedure, Correct Position, site marked, Risks and benefits discussed,  Surgical consent,  Pre-op evaluation,  At surgeon's request and post-op pain management  Laterality: Left  Prep: chloraprep       Needles:  Injection technique: Single-shot  Needle Type: Echogenic Stimulator Needle     Needle Length: 5cm  Needle Gauge: 22     Additional Needles:   Procedures:, nerve stimulator,,, ultrasound used (permanent image in chart),,,,  Narrative:  Start time: 02/07/2018 8:44 AM End time: 02/07/2018 8:49 AM Injection made incrementally with aspirations every 5 mL.  Performed by: Personally   Additional Notes: Functioning IV was confirmed and monitors were applied.  A 39mm 22ga Arrow echogenic stimulator needle was used. Sterile prep and drape,hand hygiene and sterile gloves were used. Ultrasound guidance: relevant anatomy identified, needle position confirmed, local anesthetic spread visualized around nerve(s)., vascular puncture avoided.  Image printed for medical record. Negative aspiration and negative test dose prior to incremental administration of local anesthetic. The patient tolerated the procedure well.

## 2018-02-07 NOTE — Op Note (Signed)
OPERATIVE REPORT-TOTAL KNEE ARTHROPLASTY   Pre-operative diagnosis- Osteoarthritis  Left knee(s)  Post-operative diagnosis- Osteoarthritis Left knee(s)  Procedure-  Left  Total Knee Arthroplasty  Surgeon- Dione Plover. Mahreen Schewe, MD  Assistant- Ardeen Jourdain, PA-C   Anesthesia-  Adductor canal block and spinal  EBL-50 mL   Drains Hemovac  Tourniquet time-  Total Tourniquet Time Documented: Thigh (Left) - 37 minutes Total: Thigh (Left) - 37 minutes     Complications- None  Condition-PACU - hemodynamically stable.   Brief Clinical Note  Ruth [name] is a 77 y.o. year old female with end stage OA of her left knee with progressively worsening pain and dysfunction. She has constant pain, with activity and at rest and significant functional deficits with difficulties even with ADLs. She has had extensive non-op management including analgesics, injections of cortisone and viscosupplements, and home exercise program, but remains in significant pain with significant dysfunction. Radiographs show bone on bone arthritis medial and patellofemoral. She presents now for left Total Knee Arthroplasty.    Procedure in detail---   The patient is brought into the operating room and positioned supine on the operating table. After successful administration of  Adductor canal block and spinal,   a tourniquet is placed high on the  Left thigh(s) and the lower extremity is prepped and draped in the usual sterile fashion. Time out is performed by the operating team and then the  Left lower extremity is wrapped in Esmarch, knee flexed and the tourniquet inflated to 300 mmHg.       A midline incision is made with a ten blade through the subcutaneous tissue to the level of the extensor mechanism. A fresh blade is used to make a medial parapatellar arthrotomy. Soft tissue over the proximal medial tibia is subperiosteally elevated to the joint line with a knife and into the semimembranosus bursa with a Cobb  elevator. Soft tissue over the proximal lateral tibia is elevated with attention being paid to avoiding the patellar tendon on the tibial tubercle. The patella is everted, knee flexed 90 degrees and the ACL and PCL are removed. Findings are bone on bone medial and patellofemoral with large global osteophytes.        The drill is used to create a starting hole in the distal femur and the canal is thoroughly irrigated with sterile saline to remove the fatty contents. The 5 degree Left  valgus alignment guide is placed into the femoral canal and the distal femoral cutting block is pinned to remove 10 mm off the distal femur. Resection is made with an oscillating saw.      The tibia is subluxed forward and the menisci are removed. The extramedullary alignment guide is placed referencing proximally at the medial aspect of the tibial tubercle and distally along the second metatarsal axis and tibial crest. The block is pinned to remove 13mm off the more deficient medial  side. Resection is made with an oscillating saw. Size 3is the most appropriate size for the tibia and the proximal tibia is prepared with the modular drill and keel punch for that size.      The femoral sizing guide is placed and size 3 is most appropriate. Rotation is marked off the epicondylar axis and confirmed by creating a rectangular flexion gap at 90 degrees. The size 3 cutting block is pinned in this rotation and the anterior, posterior and chamfer cuts are made with the oscillating saw. The intercondylar block is then placed and that cut is made.  Trial size 3 tibial component, trial size 3 posterior stabilized femur and a 10  mm posterior stabilized rotating platform insert trial is placed. Full extension is achieved with excellent varus/valgus and anterior/posterior balance throughout full range of motion. The patella is everted and thickness measured to be 22  mm. Free hand resection is taken to 12 mm, a 35 template is placed, lug holes  are drilled, trial patella is placed, and it tracks normally. Osteophytes are removed off the posterior femur with the trial in place. All trials are removed and the cut bone surfaces prepared with pulsatile lavage. Cement is mixed and once ready for implantation, the size 3 tibial implant, size  3 posterior stabilized femoral component, and the size 35 patella are cemented in place and the patella is held with the clamp. The trial insert is placed and the knee held in full extension. The Exparel (20 ml mixed with 60 ml saline) is injected into the extensor mechanism, posterior capsule, medial and lateral gutters and subcutaneous tissues.  All extruded cement is removed and once the cement is hard the permanent 10 mm posterior stabilized rotating platform insert is placed into the tibial tray.      The wound is copiously irrigated with saline solution and the extensor mechanism closed over a hemovac drain with #1 V-loc suture. The tourniquet is released for a total tourniquet time of 37  minutes. Flexion against gravity is 140 degrees and the patella tracks normally. Subcutaneous tissue is closed with 2.0 vicryl and subcuticular with running 4.0 Monocryl. The incision is cleaned and dried and steri-strips and a bulky sterile dressing are applied. The limb is placed into a knee immobilizer and the patient is awakened and transported to recovery in stable condition.      Please note that a surgical assistant was a medical necessity for this procedure in order to perform it in a safe and expeditious manner. Surgical assistant was necessary to retract the ligaments and vital neurovascular structures to prevent injury to them and also necessary for proper positioning of the limb to allow for anatomic placement of the prosthesis.   Dione Plover Layth Cerezo, MD    02/07/2018, 10:23 AM

## 2018-02-07 NOTE — Transfer of Care (Signed)
Immediate Anesthesia Transfer of Care Note  Patient: Ruth [name]  Procedure(s) Performed: TOTAL KNEE ARTHROPLASTY (Left Knee)  Patient Location: PACU  Anesthesia Type:Spinal and MAC combined with regional for post-op pain  Level of Consciousness: awake, alert , oriented and patient cooperative  Airway & Oxygen Therapy: Patient Spontanous Breathing and Patient connected to face mask oxygen  Post-op Assessment: Report given to RN and Post -op Vital signs reviewed and stable  Post vital signs: Reviewed and stable  Last Vitals:  Vitals Value Taken Time  BP 124/49 02/07/2018 10:45 AM  Temp    Pulse 93 02/07/2018 10:47 AM  Resp 21 02/07/2018 10:47 AM  SpO2 100 % 02/07/2018 10:47 AM  Vitals shown include unvalidated device data.  Last Pain:  Vitals:   02/07/18 0719  TempSrc:   PainSc: 8       Patients Stated Pain Goal: 4 (03/47/42 5956)  Complications: No apparent anesthesia complications

## 2018-02-07 NOTE — Progress Notes (Signed)
Pt states that her cell phone is missing. The phone was in use earlier today and pt believes it may have accidentally been wrapped up in linens and taken from the room. Linen bags and room have been searched. Laundry, security, and [facility] have been notified and are looking for the phone.

## 2018-02-07 NOTE — Evaluation (Signed)
Physical Therapy Evaluation Patient Details Name: Ruth [name] MRN: [id number] DOB: 1940-10-23 Today's Date: 02/07/2018   History of Present Illness  Pt is a 77 year old female s/p L TKA with hx of fibromyalgia, OSA, anxiety, multiple left knee surgeries  Clinical Impression  Pt is s/p TKA resulting in the deficits listed below (see PT Problem List).  Pt will benefit from skilled PT to increase their independence and safety with mobility to allow discharge to the venue listed below.  Pt assisted with ambulating a few feet POD #0 however limited by increased L knee pain.  Pt reports she plans to d/c home with spouse and receive HHPT upon d/c.     Follow Up Recommendations Follow surgeon's recommendation for DC plan and follow-up therapies(pt reports HHPT to start upon d/c)    Equipment Recommendations  None recommended by PT    Recommendations for Other Services       Precautions / Restrictions Precautions Precautions: Knee;Fall Required Braces or Orthoses: Knee Immobilizer - Left Knee Immobilizer - Left: Discontinue once straight leg raise with < 10 degree lag Restrictions Weight Bearing Restrictions: No LLE Weight Bearing: Weight bearing as tolerated      Mobility  Bed Mobility Overal bed mobility: Needs Assistance Bed Mobility: Supine to Sit     Supine to sit: Min assist     General bed mobility comments: assist for L LE over EOB and provided a hand for pt to self assist trunk upright  Transfers Overall transfer level: Needs assistance Equipment used: Rolling walker (2 wheeled) Transfers: Sit to/from Stand Sit to Stand: From elevated surface;Min assist;+2 safety/equipment         General transfer comment: verbal cues for UE and LE positioning, assist to rise and steady, assist to move L LE forward upon descent for pain control  Ambulation/Gait Ambulation/Gait assistance: Min assist;+2 safety/equipment Gait Distance (Feet): 5 Feet Assistive device: Rolling  walker (2 wheeled) Gait Pattern/deviations: Step-to pattern;Decreased step length - left;Trunk flexed;Antalgic     General Gait Details: verbal cues for sequence, RW positioning, step length, posture, pt limited by increased pain so recliner brought behind pt  Stairs            Wheelchair Mobility    Modified Rankin (Stroke Patients Only)       Balance                                             Pertinent Vitals/Pain Pain Assessment: 0-10 Pain Score: 6  Pain Location: L knee Pain Descriptors / Indicators: Aching;Sore Pain Intervention(s): Limited activity within patient's tolerance;Premedicated before session;Monitored during session;Ice applied;Repositioned    Home Living Family/patient expects to be discharged to:: Private residence Living Arrangements: Spouse/significant other   Type of Home: House Home Access: Level entry     Home Layout: One [place]: Environmental consultant - 2 wheels;Cane - single point;Bedside commode      Prior Function Level of Independence: Independent         Comments: was sleeping in lift chair prior to surgery, reports not using lift feature unless absolutely necessary     Hand Dominance        Extremity/Trunk Assessment        Lower Extremity Assessment Lower Extremity Assessment: LLE deficits/detail LLE Deficits / Details: able to perform ankle pumps, unable to perform SLR, maintained KI, ROM TBA  Communication   Communication: No difficulties  Cognition Arousal/Alertness: Awake/alert Behavior During Therapy: WFL for tasks assessed/performed Overall Cognitive Status: Within Functional Limits for tasks assessed                                        General Comments      Exercises     Assessment/Plan    PT Assessment Patient needs continued PT services  PT Problem List Decreased range of motion;Decreased strength;Decreased mobility;Decreased knowledge of use of  DME;Decreased knowledge of precautions;Pain       PT Treatment Interventions DME instruction;Functional mobility training;Gait training;Stair training;Therapeutic activities;Patient/family education;Therapeutic exercise    PT Goals (Current goals can be found in the Care Plan section)  Acute Rehab PT Goals PT Goal Formulation: With patient Time For Goal Achievement: 02/12/18 Potential to Achieve Goals: Good    Frequency 7X/week   Barriers to discharge        Co-evaluation               AM-PAC PT "6 Clicks" Mobility  Outcome Measure Help needed turning from your back to your side while in a flat bed without using bedrails?: A Little Help needed moving from lying on your back to sitting on the side of a flat bed without using bedrails?: A Little Help needed moving to and from a bed to a chair (including a wheelchair)?: A Little Help needed standing up from a chair using your arms (e.g., wheelchair or bedside chair)?: A Little Help needed to walk in hospital room?: A Little Help needed climbing 3-5 steps with a railing? : A Lot 6 Click Score: 17    End of Session Equipment Utilized During Treatment: Gait belt;Left knee immobilizer Activity Tolerance: Patient limited by pain Patient left: in chair;with call bell/phone within reach;with chair alarm set   PT Visit Diagnosis: Other abnormalities of gait and mobility (R26.89);Pain Pain - Right/Left: Left Pain - part of body: Knee    Time: 1610-9604 PT Time Calculation (min) (ACUTE ONLY): 20 min   Charges:   PT Evaluation $PT Eval Low Complexity: Camilla, PT, DPT Acute Rehabilitation Services Office: [phone] Pager: [phone]     [name] 02/07/2018, 4:14 PM

## 2018-02-08 LAB — CBC
HCT: 36.9 % (ref 36.0–46.0)
Hemoglobin: 11.5 g/dL — ABNORMAL LOW (ref 12.0–15.0)
MCH: 30.6 pg (ref 26.0–34.0)
MCHC: 31.2 g/dL (ref 30.0–36.0)
MCV: 98.1 fL (ref 80.0–100.0)
Platelets: 240 10*3/uL (ref 150–400)
RBC: 3.76 MIL/uL — ABNORMAL LOW (ref 3.87–5.11)
RDW: 13.6 % (ref 11.5–15.5)
WBC: 9.7 10*3/uL (ref 4.0–10.5)
nRBC: 0 % (ref 0.0–0.2)

## 2018-02-08 LAB — BASIC METABOLIC PANEL
Anion gap: 8 (ref 5–15)
BUN: 13 mg/dL (ref 8–23)
CO2: 26 mmol/L (ref 22–32)
Calcium: 8.7 mg/dL — ABNORMAL LOW (ref 8.9–10.3)
Chloride: 104 mmol/L (ref 98–111)
Creatinine, Ser: 0.78 mg/dL (ref 0.44–1.00)
GFR calc Af Amer: >60 mL/min (ref >60)
GFR calc non Af Amer: >60 mL/min (ref >60)
Glucose, Bld: 160 mg/dL — ABNORMAL HIGH (ref 70–99)
Potassium: 3.8 mmol/L (ref 3.5–5.1)
Sodium: 138 mmol/L (ref 135–145)

## 2018-02-08 NOTE — Plan of Care (Signed)
Plan of care reviewed and discussed with the patient and her husband.  Questions answered to patient's satisfaction.

## 2018-02-08 NOTE — Care Management Note (Addendum)
Case Management Note  Patient Details  Name: Ruth [name] MRN: [id number] Date of Birth: December 19, 1940  Subjective/Objective:                  Discharge planning states that she has [facility] with [facility]  Action/Plan:   Expected Discharge Date:  02/08/18               Expected Discharge Plan:  [place]  In-House Referral:     Discharge planning Services  CM Consult  Post Acute Care Choice:  Home Health Choice offered to:  Patient  DME Arranged:    DME Agency:     HH Arranged:  PT [place]:  [facility]  Status of Service:  Completed, signed off  If discussed at [employer] of [employer], dates discussed:    Additional Comments:  Leeroy Cha, RN 02/08/2018, 10:23 AM

## 2018-02-08 NOTE — Progress Notes (Signed)
   Subjective: 1 Day Post-Op Procedure(s) (LRB): TOTAL KNEE ARTHROPLASTY (Left) Patient reports pain as mild.   Patient seen in rounds with Dr. Wynelle Link. Patient is well, and has had no acute complaints or problems. No issues overnight. Foley removed. Voiding well. No SOB or chest pain. Plan is to go Home after hospital stay.  Objective: Vital signs in last 24 hours: Temp:  [97.6 F (36.4 C)-98.4 F (36.9 C)] 98 F (36.7 C) (12/24 0626) Pulse Rate:  [69-97] 75 (12/24 0626) Resp:  [10-23] 16 (12/24 0626) BP: (114-156)/(49-69) 124/53 (12/24 0626) SpO2:  [94 %-100 %] 99 % (12/24 0626) Weight:  [100 kg] 100 kg (12/23 0719)  Intake/Output from previous day:  Intake/Output Summary (Last 24 hours) at 02/08/2018 0713 Last data filed at 02/08/2018 0600 Gross per 24 hour  Intake 3419.43 ml  Output 6235 ml  Net -2815.57 ml     Labs: Recent Labs    02/08/18 0512  HGB 11.5*   Recent Labs    02/08/18 0512  WBC 9.7  RBC 3.76*  HCT 36.9  PLT 240   Recent Labs    02/08/18 0512  NA 138  K 3.8  CL 104  CO2 26  BUN 13  CREATININE 0.78  GLUCOSE 160*  CALCIUM 8.7*    EXAM General - Patient is Alert and Oriented Extremity - Neurologically intact Intact pulses distally Dorsiflexion/Plantar flexion intact No cellulitis present Compartment soft Dressing - dressing C/D/I Motor Function - intact, moving foot and toes well on exam.  Hemovac pulled without difficulty.  Past Medical History:  Diagnosis Date  . Anxiety   . Arthritis   . Asthma   . Depression   . Eczema   . Fibromyalgia   . GERD (gastroesophageal reflux disease)    EGD/ colon 1/09  . H pylori ulcer 1980-1990   s/p treatment  . Headache   . Hyperlipidemia   . IBS (irritable bowel syndrome)   . Laryngospasm   . Neck pain   . OSA (obstructive sleep apnea)   . PONV (postoperative nausea and vomiting)   . Refusal of blood transfusions as patient is Jehovah's Witness   . Vertigo      Assessment/Plan: 1 Day Post-Op Procedure(s) (LRB): TOTAL KNEE ARTHROPLASTY (Left) Principal Problem:   OA (osteoarthritis) of knee  Estimated body mass index is 37.83 kg/m as calculated from the following:   Height as of this encounter: 5\' 4"  (1.626 m).   Weight as of this encounter: 100 kg. Advance diet Up with therapy D/C IV fluids when tolerating POs well Anticipated LOS equal to or greater than 2 midnights due to - Age 74 and older with one or more of the following:  - Obesity  - Expected need for hospital services (PT, OT, Nursing) required for safe  discharge  - Anticipated need for postoperative skilled nursing care or inpatient rehab  - Active co-morbidities: None OR   - Unanticipated findings during/Post Surgery: None  - Patient is a high risk of re-admission due to: None   DVT Prophylaxis - Xarelto Weight-Bearing as tolerated  D/C O2 and Pulse OX and try on Room Air  Continue PT today for two sessions. Plan for DC home today pending progress. Follow up in office in 2 weeks.   Ardeen Jourdain, PA-C Orthopaedic Surgery 02/08/2018, 7:13 AM

## 2018-02-08 NOTE — Progress Notes (Signed)
Physical Therapy Treatment Patient Details Name: Ruth [name] MRN: [id number] DOB: 12-07-1940 Today's Date: 02/08/2018    History of Present Illness Pt is a 77 year old female s/p L TKA with hx of fibromyalgia, OSA, anxiety, multiple left knee surgeries    PT Comments    Pt agreeable to mobilize however very limited by pain.   Pt hopeful for d/c home later today however pt will need improved pain and mobility.   Follow Up Recommendations  Follow surgeon's recommendation for DC plan and follow-up therapies     Equipment Recommendations  None recommended by PT    Recommendations for Other Services       Precautions / Restrictions Precautions Precautions: Knee;Fall Required Braces or Orthoses: Knee Immobilizer - Left Knee Immobilizer - Left: Discontinue once straight leg raise with < 10 degree lag Restrictions LLE Weight Bearing: Weight bearing as tolerated    Mobility  Bed Mobility Overal bed mobility: Needs Assistance Bed Mobility: Supine to Sit     Supine to sit: Min assist     General bed mobility comments: assist for L LE over EOB and provided a hand for pt to self assist trunk upright  Transfers Overall transfer level: Needs assistance Equipment used: Rolling walker (2 wheeled) Transfers: Sit to/from Stand Sit to Stand: From elevated surface;Min assist         General transfer comment: verbal cues for UE and LE positioning, assist to rise and steady  Ambulation/Gait Ambulation/Gait assistance: Min assist Gait Distance (Feet): 12 Feet Assistive device: Rolling walker (2 wheeled) Gait Pattern/deviations: Step-to pattern;Decreased step length - left;Trunk flexed;Antalgic     General Gait Details: verbal cues for sequence, RW positioning, step length, posture, pt limited by increased pain so recliner brought behind pt   Stairs             Wheelchair Mobility    Modified Rankin (Stroke Patients Only)       Balance                                             Cognition Arousal/Alertness: Awake/alert Behavior During Therapy: WFL for tasks assessed/performed Overall Cognitive Status: Within Functional Limits for tasks assessed                                        Exercises      General Comments        Pertinent Vitals/Pain Pain Assessment: 0-10 Pain Score: 7  Pain Location: L knee Pain Descriptors / Indicators: Aching;Sore Pain Intervention(s): Limited activity within patient's tolerance;Premedicated before session;Monitored during session;Ice applied;Repositioned    Home Living                      Prior Function            PT Goals (current goals can now be found in the care plan section) Progress towards PT goals: Progressing toward goals    Frequency    7X/week      PT Plan Current plan remains appropriate    Co-evaluation              AM-PAC PT "6 Clicks" Mobility   Outcome Measure  Help needed turning from your back to your side while in a flat bed  without using bedrails?: A Little Help needed moving from lying on your back to sitting on the side of a flat bed without using bedrails?: A Little Help needed moving to and from a bed to a chair (including a wheelchair)?: A Little Help needed standing up from a chair using your arms (e.g., wheelchair or bedside chair)?: A Little Help needed to walk in hospital room?: A Little Help needed climbing 3-5 steps with a railing? : A Lot 6 Click Score: 17    End of Session Equipment Utilized During Treatment: Gait belt;Left knee immobilizer Activity Tolerance: Patient limited by pain Patient left: in chair;with call bell/phone within reach;with chair alarm set;with family/visitor present   PT Visit Diagnosis: Other abnormalities of gait and mobility (R26.89);Pain Pain - Right/Left: Left Pain - part of body: Knee     Time: 5176-1607 PT Time Calculation (min) (ACUTE ONLY): 20 min  Charges:   $Gait Training: 8-22 mins                    [name], PT, DPT Acute Rehabilitation Services Office: [phone] Pager: [phone]  [name] 02/08/2018, 12:38 PM

## 2018-02-08 NOTE — Progress Notes (Signed)
Physical Therapy Treatment Patient Details Name: Ruth [name] MRN: [id number] DOB: Mar 29, 1940 Today's Date: 02/08/2018    History of Present Illness Pt is a 77 year old female s/p L TKA with hx of fibromyalgia, OSA, anxiety, multiple left knee surgeries    PT Comments    Pt ambulating back to bed from bathroom on arrival.  Pt reports dizziness and pain so assisted safely back to bed.  Pt performed LE exercises to tolerance.  Pt does not appear ready for d/c home today.  Follow Up Recommendations  Follow surgeon's recommendation for DC plan and follow-up therapies     Equipment Recommendations  None recommended by PT    Recommendations for Other Services       Precautions / Restrictions Precautions Precautions: Knee;Fall Required Braces or Orthoses: Knee Immobilizer - Left Knee Immobilizer - Left: Discontinue once straight leg raise with < 10 degree lag Restrictions LLE Weight Bearing: Weight bearing as tolerated    Mobility  Bed Mobility Overal bed mobility: Needs Assistance Bed Mobility: Sit to Supine     Supine to sit: Min assist Sit to supine: Min assist   General bed mobility comments: assist for L LE due to pain  Transfers Overall transfer level: Needs assistance Equipment used: Rolling walker (2 wheeled) Transfers: Sit to/from Stand Sit to Stand: Min assist         General transfer comment: verbal cues for UE and LE positioning, assist to position L LE due to pain  Ambulation/Gait Ambulation/Gait assistance: Min guard Gait Distance (Feet): 8 Feet Assistive device: Rolling walker (2 wheeled) Gait Pattern/deviations: Step-to pattern;Decreased step length - left;Trunk flexed;Antalgic     General Gait Details: pt ambulated from bathroom back to bed with spouse holding gait belt (NT observing), pt reports increased pain and dizziness so only ambulated back to bed   Stairs             Wheelchair Mobility    Modified Rankin (Stroke Patients  Only)       Balance                                            Cognition Arousal/Alertness: Awake/alert Behavior During Therapy: WFL for tasks assessed/performed Overall Cognitive Status: Within Functional Limits for tasks assessed                                        Exercises Total Joint Exercises Ankle Circles/Pumps: AROM;10 reps;Both Quad Sets: AROM;10 reps;Left Short Arc Quad: AAROM;10 reps;Left Heel Slides: AAROM;10 reps;Left Hip ABduction/ADduction: AAROM;10 reps;Left Goniometric ROM: approx L knee flexion AAROM 30* limited by pain    General Comments        Pertinent Vitals/Pain Pain Assessment: 0-10 Pain Score: 8  Pain Location: L knee Pain Descriptors / Indicators: Aching;Sore Pain Intervention(s): Monitored during session;Premedicated before session;Limited activity within patient's tolerance;Repositioned;Ice applied    Home Living                      Prior Function            PT Goals (current goals can now be found in the care plan section) Progress towards PT goals: Progressing toward goals    Frequency    7X/week      PT Plan  Current plan remains appropriate    Co-evaluation              AM-PAC PT "6 Clicks" Mobility   Outcome Measure  Help needed turning from your back to your side while in a flat bed without using bedrails?: A Little Help needed moving from lying on your back to sitting on the side of a flat bed without using bedrails?: A Little Help needed moving to and from a bed to a chair (including a wheelchair)?: A Little Help needed standing up from a chair using your arms (e.g., wheelchair or bedside chair)?: A Little Help needed to walk in hospital room?: A Little Help needed climbing 3-5 steps with a railing? : A Little 6 Click Score: 18    End of Session Equipment Utilized During Treatment: Gait belt;Left knee immobilizer Activity Tolerance: Patient limited by  pain Patient left: with call bell/phone within reach;in bed;with family/visitor present   PT Visit Diagnosis: Other abnormalities of gait and mobility (R26.89);Pain Pain - Right/Left: Left Pain - part of body: Knee     Time: 7471-8550 PT Time Calculation (min) (ACUTE ONLY): 26 min  Charges:  $Gait Training: 8-22 mins $Therapeutic Exercise: 8-22 mins                    [name], PT, DPT Acute Rehabilitation Services Office: [phone] Pager: [phone]    [name] 02/08/2018, 3:31 PM

## 2018-02-08 NOTE — Care Management Note (Signed)
Case Management Note  Patient Details  Name: Ruth [name] MRN: [id number] Date of Birth: 10/18/1940  Subjective/Objective:                  discharged  Action/Plan: Discharged to home with self-care, orders checked for hhc needs. No CM needs present at time of discharge.  Patient is able to arrangement own appointments and home care.  Expected Discharge Date:  02/08/18               Expected Discharge Plan:  Home/Self Care  In-House Referral:     Discharge planning Services  CM Consult  Post Acute Care Choice:    Choice offered to:     DME Arranged:    DME Agency:     HH Arranged:    HH Agency:     Status of Service:  Completed, signed off  If discussed at [employer] of [employer] Meetings, dates discussed:    Additional Comments:  Leeroy Cha, RN 02/08/2018, 8:53 AM

## 2018-02-09 LAB — CBC
HCT: 37.6 % (ref 36.0–46.0)
Hemoglobin: 11.7 g/dL — ABNORMAL LOW (ref 12.0–15.0)
MCH: 30.7 pg (ref 26.0–34.0)
MCHC: 31.1 g/dL (ref 30.0–36.0)
MCV: 98.7 fL (ref 80.0–100.0)
Platelets: 249 10*3/uL (ref 150–400)
RBC: 3.81 MIL/uL — ABNORMAL LOW (ref 3.87–5.11)
RDW: 13.8 % (ref 11.5–15.5)
WBC: 10.3 10*3/uL (ref 4.0–10.5)
nRBC: 0 % (ref 0.0–0.2)

## 2018-02-09 LAB — BASIC METABOLIC PANEL
Anion gap: 10 (ref 5–15)
BUN: 19 mg/dL (ref 8–23)
CO2: 28 mmol/L (ref 22–32)
Calcium: 8.8 mg/dL — ABNORMAL LOW (ref 8.9–10.3)
Chloride: 99 mmol/L (ref 98–111)
Creatinine, Ser: 0.79 mg/dL (ref 0.44–1.00)
GFR calc Af Amer: >60 mL/min (ref >60)
GFR calc non Af Amer: >60 mL/min (ref >60)
Glucose, Bld: 130 mg/dL — ABNORMAL HIGH (ref 70–99)
Potassium: 3.8 mmol/L (ref 3.5–5.1)
Sodium: 137 mmol/L (ref 135–145)

## 2018-02-09 MED ORDER — ONDANSETRON HCL 4 MG PO TABS: 4.0000 mg | ORAL_TABLET | Freq: Four times a day (QID) | ORAL | 0 refills | Status: DC | PRN

## 2018-02-09 NOTE — Progress Notes (Signed)
   Subjective: 2 Days Post-Op Procedure(s) (LRB): TOTAL KNEE ARTHROPLASTY (Left) Patient reports pain as moderate.   Patient seen in rounds with Dr. Wynelle Link. Patient is well, and has had no acute complaints or problems other than pain in the left knee. She denies SOB and chest pain. No issues overnight. Did not meet goals with PT yesterday.    Objective: Vital signs in last 24 hours: Temp:  [97.6 F (36.4 C)-98.6 F (37 C)] 98 F (36.7 C) (12/25 0530) Pulse Rate:  [77-85] 85 (12/25 0530) Resp:  [16-18] 18 (12/25 0530) BP: (115-154)/(57-64) 142/63 (12/25 0530) SpO2:  [95 %-97 %] 95 % (12/25 0530)  Intake/Output from previous day:  Intake/Output Summary (Last 24 hours) at 02/09/2018 0804 Last data filed at 02/09/2018 0530 Gross per 24 hour  Intake 920 ml  Output 300 ml  Net 620 ml     Labs: Recent Labs    02/08/18 0512  HGB 11.5*   Recent Labs    02/08/18 0512  WBC 9.7  RBC 3.76*  HCT 36.9  PLT 240   Recent Labs    02/08/18 0512  NA 138  K 3.8  CL 104  CO2 26  BUN 13  CREATININE 0.78  GLUCOSE 160*  CALCIUM 8.7*    EXAM General - Patient is Alert and Oriented Extremity - Neurologically intact Intact pulses distally Dorsiflexion/Plantar flexion intact No cellulitis present Compartment soft Dressing/Incision - clean, dry, no drainage Motor Function - intact, moving foot and toes well on exam.   Past Medical History:  Diagnosis Date  . Anxiety   . Arthritis   . Asthma   . Depression   . Eczema   . Fibromyalgia   . GERD (gastroesophageal reflux disease)    EGD/ colon 1/09  . H pylori ulcer 1980-1990   s/p treatment  . Headache   . Hyperlipidemia   . IBS (irritable bowel syndrome)   . Laryngospasm   . Neck pain   . OSA (obstructive sleep apnea)   . PONV (postoperative nausea and vomiting)   . Refusal of blood transfusions as patient is Jehovah's Witness   . Vertigo     Assessment/Plan: 2 Days Post-Op Procedure(s) (LRB): TOTAL KNEE  ARTHROPLASTY (Left) Principal Problem:   OA (osteoarthritis) of knee  Estimated body mass index is 37.83 kg/m as calculated from the following:   Height as of this encounter: 5\' 4"  (1.626 m).   Weight as of this encounter: 100 kg. Advance diet Up with therapy  DVT Prophylaxis - Xarelto Weight-Bearing as tolerated  Will have her work with therapy today. Waiting on labs. Plan for DC home pending progress. Follow up in 2 weeks. Discharge instructions given.   Ardeen Jourdain, PA-C Orthopaedic Surgery 02/09/2018, 8:04 AM

## 2018-02-09 NOTE — Plan of Care (Signed)
Patient to discharge home. Rx given. Patient had no IV. Discharge instructions given to patient and her husband, both verbalized understanding

## 2018-02-09 NOTE — Progress Notes (Signed)
Physical Therapy Treatment Patient Details Name: Ruth [name] MRN: [id number] DOB: [birth year] Today's Date: 02/09/2018    History of Present Illness Pt is a 77 year old female s/p L TKA with hx of fibromyalgia, OSA, anxiety, multiple left knee surgeries    PT Comments    Pt cooperative and progressing with mobility but with increased time for all tasks.  Spouse present to review car transfers, don/doff KI and home therex program.   Follow Up Recommendations  Follow surgeon's recommendation for DC plan and follow-up therapies     Equipment Recommendations  None recommended by PT    Recommendations for Other Services       Precautions / Restrictions Precautions Precautions: Knee;Fall Required Braces or Orthoses: Knee Immobilizer - Left Knee Immobilizer - Left: Discontinue once straight leg raise with < 10 degree lag Restrictions Weight Bearing Restrictions: No LLE Weight Bearing: Weight bearing as tolerated    Mobility  Bed Mobility Overal bed mobility: Needs Assistance Bed Mobility: Supine to Sit     Supine to sit: Min assist     General bed mobility comments: assist for L LE due to pain  Transfers Overall transfer level: Needs assistance Equipment used: Rolling walker (2 wheeled) Transfers: Sit to/from Stand Sit to Stand: Min assist;Min guard         General transfer comment: cues for LE management and use of UEs to self assist  Ambulation/Gait Ambulation/Gait assistance: Min guard Gait Distance (Feet): 34 Feet Assistive device: Rolling walker (2 wheeled) Gait Pattern/deviations: Step-to pattern;Decreased step length - left;Trunk flexed;Antalgic Gait velocity: decr   General Gait Details: cues for posture, position from RW and sequence.   Stairs             Wheelchair Mobility    Modified Rankin (Stroke Patients Only)       Balance Overall balance assessment: Mild deficits observed, not formally tested                                           Cognition Arousal/Alertness: Awake/alert Behavior During Therapy: WFL for tasks assessed/performed Overall Cognitive Status: Within Functional Limits for tasks assessed                                        Exercises Total Joint Exercises Ankle Circles/Pumps: AROM;Both;20 reps;Supine Quad Sets: AROM;10 reps;Both;Supine Heel Slides: AAROM;Left;15 reps;Supine Straight Leg Raises: AAROM;Left;10 reps;Supine Goniometric ROM: AAROM L knee -10 - 20 with ++ muscle guarding.    General Comments        Pertinent Vitals/Pain Pain Assessment: 0-10 Pain Score: 5  Pain Location: L knee Pain Descriptors / Indicators: Aching;Sore Pain Intervention(s): Limited activity within patient's tolerance;Monitored during session;Premedicated before session;Ice applied    Home Living                      Prior Function            PT Goals (current goals can now be found in the care plan section) Acute Rehab PT Goals Patient Stated Goal: Regain IND PT Goal Formulation: With patient Time For Goal Achievement: 02/12/18 Potential to Achieve Goals: Good Progress towards PT goals: Progressing toward goals    Frequency    7X/week      PT Plan Current plan  remains appropriate    Co-evaluation              AM-PAC PT "6 Clicks" Mobility   Outcome Measure  Help needed turning from your back to your side while in a flat bed without using bedrails?: A Little Help needed moving from lying on your back to sitting on the side of a flat bed without using bedrails?: A Little Help needed moving to and from a bed to a chair (including a wheelchair)?: A Little Help needed standing up from a chair using your arms (e.g., wheelchair or bedside chair)?: A Little Help needed to walk in hospital room?: A Little Help needed climbing 3-5 steps with a railing? : A Little 6 Click Score: 18    End of Session Equipment Utilized During Treatment: Gait  belt;Left knee immobilizer Activity Tolerance: Patient limited by pain Patient left: in chair;with call bell/phone within reach;with family/visitor present Nurse Communication: Mobility status PT Visit Diagnosis: Other abnormalities of gait and mobility (R26.89);Pain Pain - Right/Left: Left Pain - part of body: Knee     Time: 5747-3403 PT Time Calculation (min) (ACUTE ONLY): 38 min  Charges:  $Gait Training: 8-22 mins $Therapeutic Exercise: 8-22 mins $Therapeutic Activity: 8-22 mins                     Debe Coder PT Acute Rehabilitation Services Pager [phone] Office [phone]    Khamari Yousuf 02/09/2018, 12:02 PM

## 2018-02-09 NOTE — Plan of Care (Signed)
Plan of care reviewed with patient and her husband.  Questions answered.

## 2018-02-10 ENCOUNTER — Encounter (HOSPITAL_COMMUNITY): Payer: Self-pay | Admitting: Orthopedic Surgery

## 2018-02-12 DIAGNOSIS — M19071 Primary osteoarthritis, right ankle and foot: Secondary | ICD-10-CM | POA: Diagnosis not present

## 2018-02-12 DIAGNOSIS — M797 Fibromyalgia: Secondary | ICD-10-CM | POA: Diagnosis not present

## 2018-02-12 DIAGNOSIS — Z96652 Presence of left artificial knee joint: Secondary | ICD-10-CM | POA: Diagnosis not present

## 2018-02-12 DIAGNOSIS — M19072 Primary osteoarthritis, left ankle and foot: Secondary | ICD-10-CM | POA: Diagnosis not present

## 2018-02-12 DIAGNOSIS — K219 Gastro-esophageal reflux disease without esophagitis: Secondary | ICD-10-CM | POA: Diagnosis not present

## 2018-02-12 DIAGNOSIS — G4733 Obstructive sleep apnea (adult) (pediatric): Secondary | ICD-10-CM | POA: Diagnosis not present

## 2018-02-12 DIAGNOSIS — F329 Major depressive disorder, single episode, unspecified: Secondary | ICD-10-CM | POA: Diagnosis not present

## 2018-02-12 DIAGNOSIS — Z7901 Long term (current) use of anticoagulants: Secondary | ICD-10-CM | POA: Diagnosis not present

## 2018-02-12 DIAGNOSIS — E785 Hyperlipidemia, unspecified: Secondary | ICD-10-CM | POA: Diagnosis not present

## 2018-02-12 DIAGNOSIS — K589 Irritable bowel syndrome without diarrhea: Secondary | ICD-10-CM | POA: Diagnosis not present

## 2018-02-12 DIAGNOSIS — M1711 Unilateral primary osteoarthritis, right knee: Secondary | ICD-10-CM | POA: Diagnosis not present

## 2018-02-12 DIAGNOSIS — Z471 Aftercare following joint replacement surgery: Secondary | ICD-10-CM | POA: Diagnosis not present

## 2018-02-12 DIAGNOSIS — M19042 Primary osteoarthritis, left hand: Secondary | ICD-10-CM | POA: Diagnosis not present

## 2018-02-12 DIAGNOSIS — M19041 Primary osteoarthritis, right hand: Secondary | ICD-10-CM | POA: Diagnosis not present

## 2018-02-14 NOTE — Discharge Summary (Signed)
Physician Discharge Summary   [name] MRN: [id number] DOB/AGE: [birth year] 78 y.o.  Admit date: 02/07/2018 Discharge date: 02/09/2018  Primary Diagnosis: Left knee osteoarthritis   Admission Diagnoses:  Past Medical History:  Diagnosis Date  . Anxiety   . Arthritis   . Asthma   . Depression   . Eczema   . Fibromyalgia   . GERD (gastroesophageal reflux disease)    EGD/ colon 1/09  . H pylori ulcer 1980-1990   s/p treatment  . Headache   . Hyperlipidemia   . IBS (irritable bowel syndrome)   . Laryngospasm   . Neck pain   . OSA (obstructive sleep apnea)   . PONV (postoperative nausea and vomiting)   . Refusal of blood transfusions as patient is Jehovah's Witness   . Vertigo    Discharge Diagnoses:   Principal Problem:   OA (osteoarthritis) of knee  Estimated body mass index is 37.83 kg/m as calculated from the following:   Height as of this encounter: 5\' 4"  (1.626 m).   Weight as of this encounter: 100 kg.  Procedure:  Procedure(s) (LRB): TOTAL KNEE ARTHROPLASTY (Left)   Consults: None  HPI: Ruth [name], 77 y.o. female, has a history of pain and functional disability in the left knee due to arthritis and has failed non-surgical conservative treatments for greater than 12 weeks to includecorticosteriod injections, viscosupplementation injections, use of assistive devices and activity modification.  Onset of symptoms was gradual, starting >10 years ago with gradually worsening course since that time. The patient noted prior procedures on the knee to include  arthroscopy on the left knee(s).  Patient currently rates pain in the left knee(s) at 10 out of 10 with activity. Patient has worsening of pain with activity and weight bearing, pain that interferes with activities of daily living and instability.  Patient has evidence of tri-compartmental bone-on-bone osteoarthritic changes by imaging studies. There is no active infection.  Laboratory  Data: Admission on 02/07/2018, Discharged on 02/09/2018  Component Date Value Ref Range Status  . Transfuse no blood products 02/03/2018    Final                   Value:TRANSFUSE NO BLOOD PRODUCTS, VERIFIED BY Leonie Green RN ON 02/03/18 AT 1140 Performed at [facility], [place] [address], [place], [place] [address]   . WBC 02/08/2018 9.7  4.0 - 10.5 K/uL Final  . RBC 02/08/2018 3.76* 3.87 - 5.11 MIL/uL Final  . Hemoglobin 02/08/2018 11.5* 12.0 - 15.0 g/dL Final  . HCT 02/08/2018 36.9  36.0 - 46.0 % Final  . MCV 02/08/2018 98.1  80.0 - 100.0 fL Final  . MCH 02/08/2018 30.6  26.0 - 34.0 pg Final  . MCHC 02/08/2018 31.2  30.0 - 36.0 g/dL Final  . RDW 02/08/2018 13.6  11.5 - 15.5 % Final  . Platelets 02/08/2018 240  150 - 400 K/uL Final  . nRBC 02/08/2018 0.0  0.0 - 0.2 % Final   Performed at [facility], [place] [address], [place], [place] [address]  . Sodium 02/08/2018 138  135 - 145 mmol/L Final  . Potassium 02/08/2018 3.8  3.5 - 5.1 mmol/L Final  . Chloride 02/08/2018 104  98 - 111 mmol/L Final  . CO2 02/08/2018 26  22 - 32 mmol/L Final  . Glucose, Bld 02/08/2018 160* 70 - 99 mg/dL Final  . BUN 02/08/2018 13  8 - 23 mg/dL Final  . Creatinine, Ser 02/08/2018  0.78  0.44 - 1.00 mg/dL Final  . Calcium 02/08/2018 8.7* 8.9 - 10.3 mg/dL Final  . GFR calc non Af Amer 02/08/2018 >60  >60 mL/min Final  . GFR calc Af Amer 02/08/2018 >60  >60 mL/min Final  . Anion gap 02/08/2018 8  5 - 15 Final   Performed at [facility], [place] [address], [place], [place] [address]  . WBC 02/09/2018 10.3  4.0 - 10.5 K/uL Final  . RBC 02/09/2018 3.81* 3.87 - 5.11 MIL/uL Final  . Hemoglobin 02/09/2018 11.7* 12.0 - 15.0 g/dL Final  . HCT 02/09/2018 37.6  36.0 - 46.0 % Final  . MCV 02/09/2018 98.7  80.0 - 100.0 fL Final  . MCH 02/09/2018 30.7  26.0 - 34.0 pg Final  . MCHC 02/09/2018 31.1  30.0 - 36.0 g/dL Final  . RDW 02/09/2018 13.8  11.5 - 15.5 % Final   . Platelets 02/09/2018 249  150 - 400 K/uL Final  . nRBC 02/09/2018 0.0  0.0 - 0.2 % Final   Performed at [facility], [place] [address], [place], [place] [address]  . Sodium 02/09/2018 137  135 - 145 mmol/L Final  . Potassium 02/09/2018 3.8  3.5 - 5.1 mmol/L Final  . Chloride 02/09/2018 99  98 - 111 mmol/L Final  . CO2 02/09/2018 28  22 - 32 mmol/L Final  . Glucose, Bld 02/09/2018 130* 70 - 99 mg/dL Final  . BUN 02/09/2018 19  8 - 23 mg/dL Final  . Creatinine, Ser 02/09/2018 0.79  0.44 - 1.00 mg/dL Final  . Calcium 02/09/2018 8.8* 8.9 - 10.3 mg/dL Final  . GFR calc non Af Amer 02/09/2018 >60  >60 mL/min Final  . GFR calc Af Amer 02/09/2018 >60  >60 mL/min Final  . Anion gap 02/09/2018 10  5 - 15 Final   Performed at [facility], [place] [address], [place], [place] [address]  Hospital Outpatient Visit on 02/03/2018  Component Date Value Ref Range Status  . aPTT 02/03/2018 29  24 - 36 seconds Final   Performed at [facility], [place] [address], [place], [place] [address]  . WBC 02/03/2018 5.3  4.0 - 10.5 K/uL Final  . RBC 02/03/2018 4.11  3.87 - 5.11 MIL/uL Final  . Hemoglobin 02/03/2018 12.9  12.0 - 15.0 g/dL Final  . HCT 02/03/2018 40.6  36.0 - 46.0 % Final  . MCV 02/03/2018 98.8  80.0 - 100.0 fL Final  . MCH 02/03/2018 31.4  26.0 - 34.0 pg Final  . MCHC 02/03/2018 31.8  30.0 - 36.0 g/dL Final  . RDW 02/03/2018 14.0  11.5 - 15.5 % Final  . Platelets 02/03/2018 226  150 - 400 K/uL Final  . nRBC 02/03/2018 0.0  0.0 - 0.2 % Final   Performed at [facility], [place] [address], [place], [place] [address]  . Sodium 02/03/2018 140  135 - 145 mmol/L Final  . Potassium 02/03/2018 4.5  3.5 - 5.1 mmol/L Final  . Chloride 02/03/2018 105  98 - 111 mmol/L Final  . CO2 02/03/2018 25  22 - 32 mmol/L Final  . Glucose, Bld 02/03/2018 97  70 - 99 mg/dL Final  . BUN 02/03/2018 21  8 - 23 mg/dL Final  . Creatinine, Ser 02/03/2018  0.77  0.44 - 1.00 mg/dL Final  . Calcium 02/03/2018 9.3  8.9 - 10.3 mg/dL Final  . Total Protein 02/03/2018 7.0  6.5 - 8.1 g/dL Final  .  Albumin 02/03/2018 4.0  3.5 - 5.0 g/dL Final  . AST 02/03/2018 18  15 - 41 U/L Final  . ALT 02/03/2018 17  0 - 44 U/L Final  . Alkaline Phosphatase 02/03/2018 64  38 - 126 U/L Final  . Total Bilirubin 02/03/2018 0.4  0.3 - 1.2 mg/dL Final  . GFR calc non Af Amer 02/03/2018 >60  >60 mL/min Final  . GFR calc Af Amer 02/03/2018 >60  >60 mL/min Final  . Anion gap 02/03/2018 10  5 - 15 Final   Performed at [facility], [place] [address] [place], [place] [address]  . Prothrombin Time 02/03/2018 12.3  11.4 - 15.2 seconds Final  . INR 02/03/2018 0.92   Final   Performed at [place] [address] [place], [place] [address]  . MRSA, PCR 02/03/2018 NEGATIVE  NEGATIVE Final  . Staphylococcus aureus 02/03/2018 NEGATIVE  NEGATIVE Final   Comment: (NOTE) The Xpert SA Assay (FDA approved for NASAL specimens in patients 60 years of age and older), is one component of a comprehensive surveillance program. It is not intended to diagnose infection nor to guide or monitor treatment. Performed at [facility], [place] [address], [place], [place] [address]        Hospital Course: Ruth [name] is a 77 y.o. who was admitted to [facility]. They were brought to the operating room on 02/07/2018 and underwent Procedure(s): TOTAL KNEE ARTHROPLASTY.  Patient tolerated the procedure well and was later transferred to the recovery room and then to the orthopaedic floor for postoperative care.  They were given PO and IV analgesics for pain control following their surgery.  They were given 24 hours of postoperative antibiotics of  Anti-infectives (From admission, onward)   Start     Dose/Rate Route Frequency Ordered Stop   02/07/18 1530  ceFAZolin (ANCEF) IVPB 2g/100 mL premix     2 g 200 mL/hr over 30 Minutes  Intravenous Every 6 hours 02/07/18 1209 02/07/18 2303   02/07/18 0700  ceFAZolin (ANCEF) IVPB 2g/100 mL premix     2 g 200 mL/hr over 30 Minutes Intravenous On call to O.R. 02/07/18 [id number] 02/07/18 0946     and started on DVT prophylaxis in the form of Xarelto.   PT and OT were ordered for total joint protocol.  Discharge planning consulted to help with postop disposition and equipment needs.  Patient had a fair night on the evening of surgery.  They started to get up OOB with therapy on day one. Hemovac drain was pulled without difficulty.  Continued to work with therapy into day two.  Dressing was changed on day two and the incision was clean and dry. Tthe patient had progressed with therapy and meeting their goals.  Incision was healing well.  Patient was seen in rounds and was ready to go home.   Diet: Cardiac diet Activity:WBAT Follow-up:in 2 weeks Disposition - Home Discharged Condition: stable   Discharge Instructions    Call MD / Call 911   Complete by:  As directed    If you experience chest pain or shortness of breath, CALL 911 and be transported to the hospital emergency room.  If you develope a fever above 101 F, pus (white drainage) or increased drainage or redness at the wound, or calf pain, call your surgeon's office.   Change dressing   Complete by:  As directed    Change dressing on Wednesday, then change the dressing daily  with sterile 4 x 4 inch gauze dressing and apply TED hose.   Constipation Prevention   Complete by:  As directed    Drink plenty of fluids.  Prune juice may be helpful.  You may use a stool softener, such as Colace (over the counter) 100 mg twice a day.  Use MiraLax (over the counter) for constipation as needed.   Diet - low sodium heart healthy   Complete by:  As directed    Discharge instructions   Complete by:  As directed    Dr. Gaynelle Arabian Total Joint Specialist [facility] [address], [place], [place] [address] [phone]  TOTAL KNEE REPLACEMENT POSTOPERATIVE DIRECTIONS  Knee Rehabilitation, Guidelines Following Surgery  Results after knee surgery are often greatly improved when you follow the exercise, range of motion and muscle strengthening exercises prescribed by your doctor. Safety measures are also important to protect the knee from further injury. Any time any of these exercises cause you to have increased pain or swelling in your knee joint, decrease the amount until you are comfortable again and slowly increase them. If you have problems or questions, call your caregiver or physical therapist for advice.   HOME CARE INSTRUCTIONS  Remove items at home which could result in a fall. This includes throw rugs or furniture in walking pathways.  ICE to the affected knee every three hours for 30 minutes at a time and then as needed for pain and swelling.  Continue to use ice on the knee for pain and swelling from surgery. You may notice swelling that will progress down to the foot and ankle.  This is normal after surgery.  Elevate the leg when you are not up walking on it.   Continue to use the breathing machine which will help keep your temperature down.  It is common for your temperature to cycle up and down following surgery, especially at night when you are not up moving around and exerting yourself.  The breathing machine keeps your lungs expanded and your temperature down. Do not place pillow under knee, focus on keeping the knee straight while resting   DIET You may resume your previous home diet once your are discharged from the hospital.  DRESSING / WOUND CARE / SHOWERING You may shower 3 days after surgery, but keep the wounds dry during showering.  You may use an occlusive plastic wrap (Press'n Seal for example), NO SOAKING/SUBMERGING IN THE BATHTUB.  If the bandage gets wet, change with a clean dry gauze.  If the incision gets wet, pat the wound dry with a clean towel. You may start showering  once you are discharged home but do not submerge the incision under water. Just pat the incision dry and apply a dry gauze dressing on daily. Change the surgical dressing daily and reapply a dry dressing each time.  ACTIVITY Walk with your walker as instructed. Use walker as long as suggested by your caregivers. Avoid periods of inactivity such as sitting longer than an hour when not asleep. This helps prevent blood clots.  You may resume a sexual relationship in one month or when given the OK by your doctor.  You may return to work once you are cleared by your doctor.  Do not drive a car for 6 weeks or until released by you surgeon.  Do not drive while taking narcotics.  WEIGHT BEARING Weight bearing as tolerated with assist device (walker, cane, etc) as directed, use it as long as  suggested by your surgeon or therapist, typically at least 4-6 weeks.  POSTOPERATIVE CONSTIPATION PROTOCOL Constipation - defined medically as fewer than three stools per week and severe constipation as less than one stool per week.  One of the most common issues patients have following surgery is constipation.  Even if you have a regular bowel pattern at home, your normal regimen is likely to be disrupted due to multiple reasons following surgery.  Combination of anesthesia, postoperative narcotics, change in appetite and fluid intake all can affect your bowels.  In order to avoid complications following surgery, here are some recommendations in order to help you during your recovery period.  Colace (docusate) - Pick up an over-the-counter form of Colace or another stool softener and take twice a day as long as you are requiring postoperative pain medications.  Take with a full glass of water daily.  If you experience loose stools or diarrhea, hold the colace until you stool forms back up.  If your symptoms do not get better within 1 week or if they get worse, check with your doctor.  Dulcolax (bisacodyl) - Pick up  over-the-counter and take as directed by the product packaging as needed to assist with the movement of your bowels.  Take with a full glass of water.  Use this product as needed if not relieved by Colace only.   MiraLax (polyethylene glycol) - Pick up over-the-counter to have on hand.  MiraLax is a solution that will increase the amount of water in your bowels to assist with bowel movements.  Take as directed and can mix with a glass of water, juice, soda, coffee, or tea.  Take if you go more than two days without a movement. Do not use MiraLax more than once per day. Call your doctor if you are still constipated or irregular after using this medication for 7 days in a row.  If you continue to have problems with postoperative constipation, please contact the office for further assistance and recommendations.  If you experience "the worst abdominal pain ever" or develop nausea or vomiting, please contact the office immediatly for further recommendations for treatment.  ITCHING  If you experience itching with your medications, try taking only a single pain pill, or even half a pain pill at a time.  You can also use Benadryl over the counter for itching or also to help with sleep.   TED HOSE STOCKINGS Wear the elastic stockings on both legs for three weeks following surgery during the day but you may remove then at night for sleeping.  MEDICATIONS See your medication summary on the "After Visit Summary" that the nursing staff will review with you prior to discharge.  You may have some home medications which will be placed on hold until you complete the course of blood thinner medication.  It is important for you to complete the blood thinner medication as prescribed by your surgeon.  Continue your approved medications as instructed at time of discharge.  PRECAUTIONS If you experience chest pain or shortness of breath - call 911 immediately for transfer to the hospital emergency department.  If you  develop a fever greater that 101 F, purulent drainage from wound, increased redness or drainage from wound, foul odor from the wound/dressing, or calf pain - CONTACT YOUR SURGEON.  FOLLOW-UP APPOINTMENTS Make sure you keep all of your appointments after your operation with your surgeon and caregivers. You should call the office at the above phone number and make an appointment for approximately two weeks after the date of your surgery or on the date instructed by your surgeon outlined in the "After Visit Summary".   RANGE OF MOTION AND STRENGTHENING EXERCISES  Rehabilitation of the knee is important following a knee injury or an operation. After just a few days of immobilization, the muscles of the thigh which control the knee become weakened and shrink (atrophy). Knee exercises are designed to build up the tone and strength of the thigh muscles and to improve knee motion. Often times heat used for twenty to thirty minutes before working out will loosen up your tissues and help with improving the range of motion but do not use heat for the first two weeks following surgery. These exercises can be done on a training (exercise) mat, on the floor, on a table or on a bed. Use what ever works the best and is most comfortable for you Knee exercises include:  Leg Lifts - While your knee is still immobilized in a splint or cast, you can do straight leg raises. Lift the leg to 60 degrees, hold for 3 sec, and slowly lower the leg. Repeat 10-20 times 2-3 times daily. Perform this exercise against resistance later as your knee gets better.  Quad and Hamstring Sets - Tighten up the muscle on the front of the thigh (Quad) and hold for 5-10 sec. Repeat this 10-20 times hourly. Hamstring sets are done by pushing the foot backward against an object and holding for 5-10 sec. Repeat as with quad sets.  Leg Slides: Lying on your back, slowly slide your foot toward your  buttocks, bending your knee up off the floor (only go as far as is comfortable). Then slowly slide your foot back down until your leg is flat on the floor again. Angel Wings: Lying on your back spread your legs to the side as far apart as you can without causing discomfort.  A rehabilitation program following serious knee injuries can speed recovery and prevent re-injury in the future due to weakened muscles. Contact your doctor or a physical therapist for more information on knee rehabilitation.   IF YOU ARE TRANSFERRED TO A SKILLED REHAB FACILITY If the patient is transferred to a skilled rehab facility following release from the hospital, a list of the current medications will be sent to the facility for the patient to continue.  When discharged from the skilled rehab facility, please have the facility set up the patient's [place] prior to being released. Also, the skilled facility will be responsible for providing the patient with their medications at time of release from the facility to include their pain medication, the muscle relaxants, and their blood thinner medication. If the patient is still at the rehab facility at time of the two week follow up appointment, the skilled rehab facility will also need to assist the patient in arranging follow up appointment in our office and any transportation needs.  MAKE SURE YOU:  Understand these instructions.  Get help right away if you are not doing well or get worse.    Pick up stool softner and laxative for home use following surgery while on pain medications. Do not submerge incision under water. Please use good hand washing techniques while changing dressing each day. May shower starting three days after surgery. Please  use a clean towel to pat the incision dry following showers. Continue to use ice for pain and swelling after surgery. Do not use any lotions or creams on the incision until instructed by your surgeon.   Do  not put a pillow under the knee. Place it under the heel.   Complete by:  As directed    Driving restrictions   Complete by:  As directed    No driving for two weeks   TED hose   Complete by:  As directed    Use stockings (TED hose) for three weeks on both leg(s).  You may remove them at night for sleeping.   Weight bearing as tolerated   Complete by:  As directed      Allergies as of 02/09/2018      Reactions   Blood-group Specific Substance Other (See Comments)   NO BLOOD PRODUCTS-refuses transfusions   Ciprofloxacin Other (See Comments)   Pt reports extreme fatigue   Codeine Nausea And Vomiting   Latex Other (See Comments)   Red and raw area around incision   Vicodin [hydrocodone-acetaminophen] Nausea And Vomiting   Tramadol Other (See Comments)   Increased heart rate,increased blood pressure, edema      Medication List    STOP taking these medications   b complex vitamins tablet   COQ10 PO   oxyCODONE-acetaminophen 5-325 MG tablet Commonly known as:  PERCOCET/ROXICET   vitamin C 500 MG tablet Commonly known as:  ASCORBIC ACID   VITAMIN D3 PO     TAKE these medications   albuterol 108 (90 Base) MCG/ACT inhaler Commonly known as:  PROVENTIL HFA;VENTOLIN HFA Inhale 2 puffs into the lungs every 6 (six) hours as needed for wheezing.   diazepam 5 MG tablet Commonly known as:  VALIUM 1 bid prn anxiety What changed:    how much to take  how to take this  when to take this  reasons to take this  additional instructions   dicyclomine 10 MG capsule Commonly known as:  BENTYL Take 1 capsule (10 mg total) by mouth 3 (three) times daily as needed. For stomach cramps   FLUoxetine 20 MG tablet Commonly known as:  PROZAC Take 1 tablet (20 mg total) by mouth daily.   fluticasone 50 MCG/ACT nasal spray Commonly known as:  FLONASE Place 2 sprays into both nostrils daily as needed for allergies.   guaifenesin 400 MG Tabs tablet Commonly known as:  HUMIBID  E Take 800 mg by mouth 3 (three) times daily.   LUBRICANT DROPS OP Place 2 drops into both eyes 3 (three) times daily as needed (for dry eyes).   methocarbamol 500 MG tablet Commonly known as:  ROBAXIN Take 1 tablet (500 mg total) by mouth every 6 (six) hours as needed for muscle spasms.   mometasone 0.1 % cream Commonly known as:  ELOCON Apply BID prn   nitroGLYCERIN 0.4 MG SL tablet Commonly known as:  NITROSTAT Place 1 tablet (0.4 mg total) under the tongue every 5 (five) minutes as needed for chest pain.   ondansetron 4 MG tablet Commonly known as:  ZOFRAN Take 1 tablet (4 mg total) by mouth every 6 (six) hours as needed for nausea.   oxyCODONE 5 MG immediate release tablet Commonly known as:  Oxy IR/ROXICODONE Take 1-2 tablets (5-10 mg total) by mouth every 6 (six) hours as needed for moderate pain (pain score 4-6).   pantoprazole 40 MG tablet Commonly known as:  PROTONIX Take 40 mg by  mouth 2 (two) times daily.   rivaroxaban 10 MG Tabs tablet Commonly known as:  XARELTO Take 1 tablet (10 mg total) by mouth daily with breakfast for 21 days. Then take one 81 mg aspirin once a day for three weeks. Then discontinue aspirin.            Discharge Care Instructions  (From admission, onward)         Start     Ordered   02/07/18 0000  Weight bearing as tolerated     02/07/18 1224   02/07/18 0000  Change dressing    Comments:  Change dressing on Wednesday, then change the dressing daily with sterile 4 x 4 inch gauze dressing and apply TED hose.   02/07/18 1224         Follow-up Information    Gaynelle Arabian, MD. Schedule an appointment as soon as possible for a visit on 02/22/2018.   Specialty:  Orthopedic Surgery Contact information: [address] [place] [address] [phone]           Signed: Ardeen Jourdain, PA-C Orthopaedic Surgery 02/14/2018, 12:46 PM

## 2018-02-18 ENCOUNTER — Telehealth: Payer: Self-pay | Admitting: Family Medicine

## 2018-02-18 NOTE — Telephone Encounter (Signed)
I called and left a message asked that the pt r/c.

## 2018-02-18 NOTE — Telephone Encounter (Signed)
Knee replacement surgery x 2-3 weeks ago, pt is currently taking opioids and is having constipation issues, pt states she had one bowel movement last night. Advise.   Pt states she has been taking enema's to helps with relief.   Pharmacy:  [facility] [place], [place]

## 2018-02-18 NOTE — Telephone Encounter (Signed)
I would recommend Dulcolax tablet daily Also should take a stool softener daily May also use MiraLAX 1 capful with 8 ounces of water on a daily basis The goal is to have a soft bowel movement but more than likely will only have a bowel movement every few days while on opioids As her pain improves it is in the patient's best interest for her to taper down on the pain medicine

## 2018-02-18 NOTE — Telephone Encounter (Signed)
Please advise. Thank you

## 2018-02-21 DIAGNOSIS — Z96652 Presence of left artificial knee joint: Secondary | ICD-10-CM | POA: Insufficient documentation

## 2018-02-21 DIAGNOSIS — Z471 Aftercare following joint replacement surgery: Secondary | ICD-10-CM | POA: Insufficient documentation

## 2018-02-22 ENCOUNTER — Other Ambulatory Visit: Payer: Self-pay

## 2018-02-22 ENCOUNTER — Ambulatory Visit (HOSPITAL_COMMUNITY): Payer: PPO | Attending: Student

## 2018-02-22 ENCOUNTER — Encounter (HOSPITAL_COMMUNITY): Payer: Self-pay

## 2018-02-22 DIAGNOSIS — M25662 Stiffness of left knee, not elsewhere classified: Secondary | ICD-10-CM

## 2018-02-22 DIAGNOSIS — M25562 Pain in left knee: Secondary | ICD-10-CM | POA: Diagnosis not present

## 2018-02-22 DIAGNOSIS — M6281 Muscle weakness (generalized): Secondary | ICD-10-CM | POA: Diagnosis not present

## 2018-02-22 DIAGNOSIS — R600 Localized edema: Secondary | ICD-10-CM

## 2018-02-22 NOTE — Therapy (Signed)
[place], [place], [address] Phone: [phone]   Fax:  [phone]  Physical Therapy Evaluation  Patient Details  Name: Ruth [name] MRN: [id number] Date of Birth: Nov 20, 1940 Referring Provider (PT): Theresa Duty, PA-C (Surgeon: Gaynelle Arabian, MD)   Encounter Date: 02/22/2018  PT End of Session - 02/22/18 1212    Visit Number  1    Number of Visits  19    Date for PT Re-Evaluation  04/05/18   mini reassess due 1/288/20   Authorization Type  [employer]    Authorization Time Period  02/22/18 to 04/05/18    Authorization - Visit Number  1    Authorization - Number of Visits  10    PT Start Time  0948    PT Stop Time  1034    PT Time Calculation (min)  46 min    Activity Tolerance  Patient limited by pain    Behavior During Therapy  [facility] for tasks assessed/performed       Past Medical History:  Diagnosis Date  . Anxiety   . Arthritis   . Asthma   . Depression   . Eczema   . Fibromyalgia   . GERD (gastroesophageal reflux disease)    EGD/ colon 1/09  . H pylori ulcer 1980-1990   s/p treatment  . Headache   . Hyperlipidemia   . IBS (irritable bowel syndrome)   . Laryngospasm   . Neck pain   . OSA (obstructive sleep apnea)   . PONV (postoperative nausea and vomiting)   . Refusal of blood transfusions as patient is Jehovah's Witness   . Vertigo     Past Surgical History:  Procedure Laterality Date  . ABDOMINAL HYSTERECTOMY  1970  . APPENDECTOMY    . BILATERAL SALPINGOOPHORECTOMY  1990s  . CARDIAC CATHETERIZATION  2002   [facility]) normal coronary arteries   . COLONOSCOPY  01/2007   Dr. Meriel Flavors  . COLONOSCOPY N/A 04/18/2015   Procedure: COLONOSCOPY;  Surgeon: Rogene Houston, MD;  [facility];  Service: Endoscopy;  Laterality: N/A;  830 - moved to 8:55 - Ann to notify pt  . COLONOSCOPY WITH ESOPHAGOGASTRODUODENOSCOPY (EGD)  02/16/2012   Procedure: COLONOSCOPY WITH  ESOPHAGOGASTRODUODENOSCOPY (EGD);  Surgeon: Daneil Dolin, MD;  [facility];  Service: Endoscopy;  Laterality: N/A;  8:45  . ESOPHAGEAL DILATION N/A 06/07/2014   Procedure: ESOPHAGEAL DILATION;  Surgeon: Rogene Houston, MD;  [facility];  Service: Endoscopy;  Laterality: N/A;  . ESOPHAGOGASTRODUODENOSCOPY  01/2007   Dr Sharlett Iles- 3 cm hiatal hernia, benign esophageal biopsies, erosive esophagitis, gastritis  . ESOPHAGOGASTRODUODENOSCOPY N/A 06/07/2014   Procedure: ESOPHAGOGASTRODUODENOSCOPY (EGD);  Surgeon: Rogene Houston, MD;  [facility];  Service: Endoscopy;  Laterality: N/A;  1200  . ESOPHAGOGASTRODUODENOSCOPY N/A 08/18/2017   Procedure: ESOPHAGOGASTRODUODENOSCOPY (EGD);  Surgeon: Rogene Houston, MD;  [facility];  Service: Endoscopy;  Laterality: N/A;  2:20  . FOOT SURGERY    . INNER EAR SURGERY     Left  . KNEE SURGERY     Multiple  . SHOULDER SURGERY  2011  . TONSILLECTOMY    . TOTAL KNEE ARTHROPLASTY Left 02/07/2018   Procedure: TOTAL KNEE ARTHROPLASTY;  Surgeon: Gaynelle Arabian, MD;  [facility]: [facility];  Service: Orthopedics;  Laterality: Left;  62min  . TUBAL LIGATION    . TYMPANOSTOMY TUBE PLACEMENT      There were no vitals  filed for this visit.   Subjective Assessment - 02/22/18 0954    Subjective  Pt states that she had L TKA on 02/07/18 by Dr. Wynelle Link. She had HHPT for 2 visits over the last 2 weeks but was d/c on 02/18/18. Pt states that she is having difficulty bending it, walking, and WB. Her pain is relatively well-managed with her pain pills. She states that prior to her surgery, she would use a SPC or RW intermittently for pain and/or knee weakness. She also has R knee pain which is eventually going to need surgery. She also has h/o chornic L hip pain and states that the L hip pain has been limiting her TKA exercises more than her knee.     Limitations  Walking;Standing    How long can you sit comfortably?  5 mins or <     How long can you stand comfortably?  5 mins or <    How long can you walk comfortably?  10 mins    Patient Stated Goals  be able to function and stand and walk, potentially squat    Currently in Pain?  Yes    Pain Score  5     Pain Location  Knee    Pain Orientation  Left    Pain Descriptors / Indicators  Dull;Aching    Pain Type  Surgical pain    Pain Onset  1 to 4 weeks ago    Pain Frequency  Constant    Aggravating Factors   WB, standing, walking, bending    Pain Relieving Factors  pain medicine, ice         [facility] PT Assessment - 02/22/18 0001      Assessment   Medical Diagnosis  L TKA    Referring Provider (PT)  Theresa Duty, PA-C   Surgeon: Gaynelle Arabian, MD   Onset Date/Surgical Date  02/07/18    Next MD Visit  02/22/18    Prior Therapy  HHPT d/c on 02/18/18      Balance Screen   Has the patient fallen in the past 6 months  No    Has the patient had a decrease in activity level because of a fear of falling?   Yes    Is the patient reluctant to leave their home because of a fear of falling?   No      Prior Function   Level of Independence  Independent;Independent with household mobility with device    Leisure  play with dog, watch TV with husband      Observation/Other Assessments-Edema    Edema  Circumferential      Circumferential Edema   Circumferential - Right  47cm, joint line    Circumferential - Left   50cm, joint line      Functional Tests   Functional tests  Sit to Stand      Sit to Stand   Comments  30sec chair rise test: 8x, BUE support      ROM / Strength   AROM / PROM / Strength  AROM;Strength      AROM   Overall AROM Comments  difficult to assess PROM / end feel due to pt guarding; assessed flexion in both supine and seated to assess if it would be more comfortable for pt and she achieved the same ROM    AROM Assessment Site  Knee    Right/Left Knee  Left    Left Knee Extension  13    Left Knee Flexion  38      Strength   Strength  Assessment Site  Hip;Knee;Ankle    Right Hip Flexion  4+/5    Right Hip Extension  3+/5   weak based on functional assessment   Right Hip ABduction  4-/5    Left Hip Flexion  4/5    Left Hip Extension  3+/5   weak based on functional assessment   Left Hip ABduction  3+/5    Right Knee Extension  4+/5    Left Knee Flexion  3-/5   weak based on difficulty performing flexion AROM   Left Knee Extension  4-/5    Right Ankle Dorsiflexion  4+/5    Left Ankle Dorsiflexion  4/5      Palpation   Patella mobility  hypomobile thorughout    Palpation comment  increased restrictions throughout quads, HS, and glutes      Ambulation/Gait   Ambulation Distance (Feet)  232 Feet   3MWT   Assistive device  Rolling walker    Gait Pattern  Step-through pattern;Decreased dorsiflexion - left;Decreased weight shift to left;Decreased stance time - left;Antalgic;Trendelenburg      Balance   Balance Assessed  Yes      Static Standing Balance   Static Standing - Balance Support  No upper extremity supported    Static Standing Balance -  Activities   Single Leg Stance - Right Leg;Single Leg Stance - Left Leg    Static Standing - Comment/# of Minutes  R: 11sec L: 2 sec or <      Standardized Balance Assessment   Standardized Balance Assessment  --           Objective measurements completed on examination: See above findings.        PT Education - 02/22/18 1211    Education Details  exam findings, HEP, POC    Person(s) Educated  Patient;Spouse    Methods  Explanation;Demonstration;Handout    Comprehension  Verbalized understanding;Returned demonstration       PT Short Term Goals - 02/22/18 1256      PT SHORT TERM GOAL #1   Title  Pt will be independent with and consistently perform HEP to improve ROM and reduce pain.     Time  3    Period  Weeks    Status  New    Target Date  03/15/18      PT SHORT TERM GOAL #2   Title  Pt will have reduced edema by 2cm or > at joint line in order  to maximize ROM and reduce pain.     Time  3    Period  Weeks    Status  New      PT SHORT TERM GOAL #3   Title  Pt will have improved L knee AROM from 5 to 75 deg in order to reduce pain and maximize gait.     Time  3    Period  Weeks    Status  New        PT Long Term Goals - 02/22/18 1257      PT LONG TERM GOAL #1   Title  Pt will have improved L knee AROM from 0 to 110deg in order to further reduce pain and maximize stair ambulation.     Time  6    Period  Weeks    Status  New    Target Date  04/05/18      PT LONG TERM GOAL #2  Title  Pt will have improved MMT to 4/5 or > throughout mm groups tested in order to maximize gait and functional mobility.     Time  6    Period  Weeks    Status  New      PT LONG TERM GOAL #3   Title  Pt will be able to perform 12STS during 30sec chair rise test without UE support and proper form in order to demo improved balance and functional strength.     Time  6    Period  Weeks    Status  New      PT LONG TERM GOAL #4   Title  Pt will be able to perform L SLS for 10sec or > to demo improved balance, core and functional strength in order to maximize gait, stairs, and other functional mobility.     Time  6    Period  Weeks    Status  New      PT LONG TERM GOAL #5   Title  Pt will be able to ambulate at least 422ft during 3MWT with LRAD, 2/10 L knee pain, and gait WFL in order to demo improved tolerance to functional mobility and maximize her HH ambulation.    Time  6    Period  Weeks    Status  New             Plan - 02/22/18 1238    Clinical Impression Statement  Pt is pleasant 78YO F who presents to OPPT s/p L TKA on 02/07/18 by Dr. Wynelle Link. Pt has post-op deficits in edema, ROM, MMT, balance, gait, functional strength and functional mobility. Pt stating she had 2 visits worth of HHPT prior to being d/c from it due to the agency frequently cancelling/rescheduling her visits. Pt's AROM 13-38deg this date; PT provided heavy  education on importance of regaining her knee ROM back. PT attempted to assess PROM and her end feel, however, difficult to assess due to pt guarding. Pt and her husband also state that her L hip has been bothering her more since her surgery as well and it has been limiting her ability to complete her exercises at home. Pt reporting that her lack of knee flexion was limited due to her L knee pain, not L hip pain. Pt needs skilled PT intervention in order to address these impairments and maximize her return to PLOF.     Clinical Presentation  Stable    Clinical Presentation due to:  see flowsheets for objective tests and measures    Clinical Decision Making  Low    Rehab Potential  Fair    PT Frequency  3x / week    PT Duration  6 weeks    PT Treatment/Interventions  ADLs/Self Care Home Management;Aquatic Therapy;Cryotherapy;Electrical Stimulation;Moist Heat;Gait training;DME Instruction;Stair training;Functional mobility training;Therapeutic activities;Therapeutic exercise;Balance training;Neuromuscular re-education;Patient/family education;Manual techniques;Compression bandaging;Scar mobilization;Orthotic Fit/Training;Passive range of motion;Dry needling;Energy conservation;Taping    PT Next Visit Plan  review goals; administer FOTO; heavy focus on AROM, particularly flexion, and edema initially    PT Home Exercise Plan  eval: quad set, supine heel slide, seated HS stretch, seated knee flexion, hooklying SKTC (husband to assist)    Consulted and Agree with Plan of Care  Patient;Family member/caregiver    Family Member Consulted  husband       Patient will benefit from skilled therapeutic intervention in order to improve the following deficits and impairments:  Abnormal gait, Decreased activity tolerance, Decreased balance, Decreased endurance,  Decreased mobility, Decreased range of motion, Decreased scar mobility, Decreased strength, Difficulty walking, Hypomobility, Increased edema, Increased  fascial restricitons, Increased muscle spasms, Impaired flexibility, Improper body mechanics, Postural dysfunction, Obesity, Pain  Visit Diagnosis: Acute pain of left knee - Plan: PT plan of care cert/re-cert  Stiffness of left knee, not elsewhere classified - Plan: PT plan of care cert/re-cert  Localized edema - Plan: PT plan of care cert/re-cert  Muscle weakness (generalized) - Plan: PT plan of care cert/re-cert     Problem List Patient Active Problem List   Diagnosis Date Noted  . OA (osteoarthritis) of knee 02/07/2018  . Other allergic contact dermatitis 12/20/2017  . Non-allergic rhinitis 12/16/2017  . Allergy to metal 12/16/2017  . Gastroesophageal reflux disease without esophagitis 07/15/2017  . Primary osteoarthritis of both feet 04/29/2017  . Family history of early CAD 04/15/2016  . Primary osteoarthritis of both knees 12/31/2015  . Chest pain 07/13/2015  . Atypical chest pain 07/13/2015  . Major depression 03/19/2015  . Vulvodynia 08/03/2014  . Functional constipation 08/03/2014  . Anxiety 07/06/2014  . Fibromyalgia 04/25/2014  . Snoring 04/22/2014  . Prediabetes 03/18/2014  . Hyperlipidemia 03/18/2014  . Osteopenia 03/06/2014  . History of cardiac catheterization 04/28/2013  . Dyspepsia 02/02/2012  . GERD (gastroesophageal reflux disease) 02/02/2012  . Depression 06/18/2011  . Dysphagia 02/05/2011  . CHEST PAIN 02/11/2009  . GASTROESOPHAGEAL REFLUX DISEASE, CHRONIC 02/15/2007  . IRRITABLE BOWEL SYNDROME 02/15/2007  . Primary osteoarthritis of both hands 02/15/2007  . Reflux esophagitis 02/14/2007  . GASTRITIS 02/14/2007        Geraldine Solar PT, DPT  [facility] [address] [place], [place], [address] Phone: [phone]   Fax:  [phone]  Name: VERDENE CRESON MRN: [id number] Date of Birth: 07-Feb-1941

## 2018-02-23 ENCOUNTER — Encounter (HOSPITAL_COMMUNITY): Payer: Self-pay

## 2018-02-23 ENCOUNTER — Ambulatory Visit (HOSPITAL_COMMUNITY): Payer: PPO

## 2018-02-23 DIAGNOSIS — M6281 Muscle weakness (generalized): Secondary | ICD-10-CM

## 2018-02-23 DIAGNOSIS — M25562 Pain in left knee: Secondary | ICD-10-CM | POA: Diagnosis not present

## 2018-02-23 DIAGNOSIS — M25662 Stiffness of left knee, not elsewhere classified: Secondary | ICD-10-CM

## 2018-02-23 DIAGNOSIS — R600 Localized edema: Secondary | ICD-10-CM

## 2018-02-23 NOTE — Therapy (Signed)
[place], [place], [address] Phone: [phone]   Fax:  [phone]  Physical Therapy Treatment  Patient Details  Name: Ruth [name] MRN: [id number] Date of Birth: Dec 16, 1940 Referring Provider (PT): Theresa Duty, PA-C (Surgeon: Gaynelle Arabian, MD)   Encounter Date: 02/23/2018  PT End of Session - 02/23/18 1432    Visit Number  2    Number of Visits  19    Date for PT Re-Evaluation  04/05/18   Minireassess 03/15/18   Authorization Type  [employer]    Authorization Time Period  02/22/18 to 04/05/18    Authorization - Visit Number  2    Authorization - Number of Visits  10    PT Start Time  1428    PT Stop Time  1513    PT Time Calculation (min)  45 min    Equipment Utilized During Treatment  Gait belt    Activity Tolerance  Patient tolerated treatment well    Behavior During Therapy  [facility] for tasks assessed/performed       Past Medical History:  Diagnosis Date  . Anxiety   . Arthritis   . Asthma   . Depression   . Eczema   . Fibromyalgia   . GERD (gastroesophageal reflux disease)    EGD/ colon 1/09  . H pylori ulcer 1980-1990   s/p treatment  . Headache   . Hyperlipidemia   . IBS (irritable bowel syndrome)   . Laryngospasm   . Neck pain   . OSA (obstructive sleep apnea)   . PONV (postoperative nausea and vomiting)   . Refusal of blood transfusions as patient is Jehovah's Witness   . Vertigo     Past Surgical History:  Procedure Laterality Date  . ABDOMINAL HYSTERECTOMY  1970  . APPENDECTOMY    . BILATERAL SALPINGOOPHORECTOMY  1990s  . CARDIAC CATHETERIZATION  2002   [facility]) normal coronary arteries   . COLONOSCOPY  01/2007   Dr. Meriel Flavors  . COLONOSCOPY N/A 04/18/2015   Procedure: COLONOSCOPY;  Surgeon: Rogene Houston, MD;  [facility];  Service: Endoscopy;  Laterality: N/A;  830 - moved to 8:55 - Ann to notify pt  . COLONOSCOPY WITH ESOPHAGOGASTRODUODENOSCOPY  (EGD)  02/16/2012   Procedure: COLONOSCOPY WITH ESOPHAGOGASTRODUODENOSCOPY (EGD);  Surgeon: Daneil Dolin, MD;  [facility];  Service: Endoscopy;  Laterality: N/A;  8:45  . ESOPHAGEAL DILATION N/A 06/07/2014   Procedure: ESOPHAGEAL DILATION;  Surgeon: Rogene Houston, MD;  [facility];  Service: Endoscopy;  Laterality: N/A;  . ESOPHAGOGASTRODUODENOSCOPY  01/2007   Dr Sharlett Iles- 3 cm hiatal hernia, benign esophageal biopsies, erosive esophagitis, gastritis  . ESOPHAGOGASTRODUODENOSCOPY N/A 06/07/2014   Procedure: ESOPHAGOGASTRODUODENOSCOPY (EGD);  Surgeon: Rogene Houston, MD;  [facility];  Service: Endoscopy;  Laterality: N/A;  1200  . ESOPHAGOGASTRODUODENOSCOPY N/A 08/18/2017   Procedure: ESOPHAGOGASTRODUODENOSCOPY (EGD);  Surgeon: Rogene Houston, MD;  [facility];  Service: Endoscopy;  Laterality: N/A;  2:20  . FOOT SURGERY    . INNER EAR SURGERY     Left  . KNEE SURGERY     Multiple  . SHOULDER SURGERY  2011  . TONSILLECTOMY    . TOTAL KNEE ARTHROPLASTY Left 02/07/2018   Procedure: TOTAL KNEE ARTHROPLASTY;  Surgeon: Gaynelle Arabian, MD;  [facility];  Service: Orthopedics;  Laterality: Left;  33min  . TUBAL LIGATION    . TYMPANOSTOMY TUBE PLACEMENT  There were no vitals filed for this visit.  Subjective Assessment - 02/23/18 1425    Subjective  Pt arrived ambulating wiht RW and husband with her for session.  Reports pain scale 4/10 Lt knee with pain medication prior session.      Patient Stated Goals  be able to function and stand and walk, potentially squat    Currently in Pain?  Yes    Pain Score  4     Pain Location  Knee    Pain Orientation  Left    Pain Descriptors / Indicators  Aching;Dull;Sore;Sharp    Pain Type  Surgical pain    Pain Onset  1 to 4 weeks ago    Pain Frequency  Constant    Aggravating Factors   WB, standing, walking, bending    Pain Relieving Factors  pain medicine, ice                        [facility] Adult PT Treatment/Exercise - 02/23/18 0001      Exercises   Exercises  Knee/Hip      Knee/Hip Exercises: Seated   Heel Slides  10 reps      Knee/Hip Exercises: Supine   Quad Sets  10 reps    Heel Slides  10 reps    Knee Extension  AROM    Knee Extension Limitations  11   was 13   Knee Flexion Limitations  43      Manual Therapy   Manual Therapy  Edema management             PT Education - 02/23/18 1543    Education Details  Reviewed goals and assured compliance wiht HEP.  Educated RICE technqiues for pain and edema control and encouraged increased frequency with ice application for 20 minute segments.      Person(s) Educated  Patient;Spouse    Methods  Explanation;Demonstration    Comprehension  Verbalized understanding;Returned demonstration       PT Short Term Goals - 02/22/18 1256      PT SHORT TERM GOAL #1   Title  Pt will be independent with and consistently perform HEP to improve ROM and reduce pain.     Time  3    Period  Weeks    Status  New    Target Date  03/15/18      PT SHORT TERM GOAL #2   Title  Pt will have reduced edema by 2cm or > at joint line in order to maximize ROM and reduce pain.     Time  3    Period  Weeks    Status  New      PT SHORT TERM GOAL #3   Title  Pt will have improved L knee AROM from 5 to 75 deg in order to reduce pain and maximize gait.     Time  3    Period  Weeks    Status  New        PT Long Term Goals - 02/22/18 1257      PT LONG TERM GOAL #1   Title  Pt will have improved L knee AROM from 0 to 110deg in order to further reduce pain and maximize stair ambulation.     Time  6    Period  Weeks    Status  New    Target Date  04/05/18      PT LONG TERM GOAL #2  Title  Pt will have improved MMT to 4/5 or > throughout mm groups tested in order to maximize gait and functional mobility.     Time  6    Period  Weeks    Status  New      PT LONG TERM GOAL #3   Title   Pt will be able to perform 12STS during 30sec chair rise test without UE support and proper form in order to demo improved balance and functional strength.     Time  6    Period  Weeks    Status  New      PT LONG TERM GOAL #4   Title  Pt will be able to perform L SLS for 10sec or > to demo improved balance, core and functional strength in order to maximize gait, stairs, and other functional mobility.     Time  6    Period  Weeks    Status  New      PT LONG TERM GOAL #5   Title  Pt will be able to ambulate at least 463ft during 3MWT with LRAD, 2/10 L knee pain, and gait WFL in order to demo improved tolerance to functional mobility and maximize her HH ambulation.    Time  6    Period  Weeks    Status  New            Plan - 02/23/18 1621    Clinical Impression Statement  Reviewed goals and assured compliance wiht HEP.  FOTO complete with 70% limitation.  Session focus on knee mobility.  Pt educated on techniques to assist with edema control wiht RICE techniques and reviewed frequency and duraction of ice application; as well as benefits with HEP complaince for pain and mobility control.  Manual retrograde massage complete as well as patella mobs for mobility.  AROM improved 11-43 degrees (was 13-38 degrees last session).  EOS reports of pain reduced.      Rehab Potential  Fair    PT Frequency  3x / week    PT Duration  6 weeks    PT Treatment/Interventions  ADLs/Self Care Home Management;Aquatic Therapy;Cryotherapy;Electrical Stimulation;Moist Heat;Gait training;DME Instruction;Stair training;Functional mobility training;Therapeutic activities;Therapeutic exercise;Balance training;Neuromuscular re-education;Patient/family education;Manual techniques;Compression bandaging;Scar mobilization;Orthotic Fit/Training;Passive range of motion;Dry needling;Energy conservation;Taping    PT Next Visit Plan  heavy focus on AROM, particularly flexion, and edema initially    PT Home Exercise Plan   eval: quad set, supine heel slide, seated HS stretch, seated knee flexion, hooklying SKTC (husband to assist)       Patient will benefit from skilled therapeutic intervention in order to improve the following deficits and impairments:  Abnormal gait, Decreased activity tolerance, Decreased balance, Decreased endurance, Decreased mobility, Decreased range of motion, Decreased scar mobility, Decreased strength, Difficulty walking, Hypomobility, Increased edema, Increased fascial restricitons, Increased muscle spasms, Impaired flexibility, Improper body mechanics, Postural dysfunction, Obesity, Pain  Visit Diagnosis: Acute pain of left knee  Stiffness of left knee, not elsewhere classified  Localized edema  Muscle weakness (generalized)     Problem List Patient Active Problem List   Diagnosis Date Noted  . OA (osteoarthritis) of knee 02/07/2018  . Other allergic contact dermatitis 12/20/2017  . Non-allergic rhinitis 12/16/2017  . Allergy to metal 12/16/2017  . Gastroesophageal reflux disease without esophagitis 07/15/2017  . Primary osteoarthritis of both feet 04/29/2017  . Family history of early CAD 04/15/2016  . Primary osteoarthritis of both knees 12/31/2015  . Chest pain 07/13/2015  .  Atypical chest pain 07/13/2015  . Major depression 03/19/2015  . Vulvodynia 08/03/2014  . Functional constipation 08/03/2014  . Anxiety 07/06/2014  . Fibromyalgia 04/25/2014  . Snoring 04/22/2014  . Prediabetes 03/18/2014  . Hyperlipidemia 03/18/2014  . Osteopenia 03/06/2014  . History of cardiac catheterization 04/28/2013  . Dyspepsia 02/02/2012  . GERD (gastroesophageal reflux disease) 02/02/2012  . Depression 06/18/2011  . Dysphagia 02/05/2011  . CHEST PAIN 02/11/2009  . GASTROESOPHAGEAL REFLUX DISEASE, CHRONIC 02/15/2007  . IRRITABLE BOWEL SYNDROME 02/15/2007  . Primary osteoarthritis of both hands 02/15/2007  . Reflux esophagitis 02/14/2007  . GASTRITIS 02/14/2007   Ihor Austin, [place]; [place]  Aldona Lento 02/23/2018, 5:14 PM  [facility] [address] [place], [place], [address] Phone: [phone]   Fax:  [phone]  Name: Ruth [name] MRN: [id number] Date of Birth: 1940/03/27

## 2018-02-24 NOTE — Telephone Encounter (Signed)
So noted thank you 

## 2018-02-24 NOTE — Telephone Encounter (Signed)
Patient stated she is doing better-she has tapered down on pain med and has gotten relief from her constipation.

## 2018-02-25 ENCOUNTER — Ambulatory Visit (HOSPITAL_COMMUNITY): Payer: PPO | Admitting: Physical Therapy

## 2018-02-25 ENCOUNTER — Encounter (HOSPITAL_COMMUNITY): Payer: Self-pay | Admitting: Physical Therapy

## 2018-02-25 DIAGNOSIS — R600 Localized edema: Secondary | ICD-10-CM

## 2018-02-25 DIAGNOSIS — M25662 Stiffness of left knee, not elsewhere classified: Secondary | ICD-10-CM

## 2018-02-25 DIAGNOSIS — M25562 Pain in left knee: Secondary | ICD-10-CM | POA: Diagnosis not present

## 2018-02-25 DIAGNOSIS — M6281 Muscle weakness (generalized): Secondary | ICD-10-CM

## 2018-02-25 NOTE — Therapy (Signed)
[facility] [place], [place], [address] Phone: [phone]   Fax:  [phone]  Physical Therapy Treatment  Patient Details  Name: Ruth [name] MRN: [id number] Date of Birth: 08/30/1940 Referring Provider (PT): Theresa Duty, PA-C (Surgeon: Gaynelle Arabian, MD)   Encounter Date: 02/25/2018  PT End of Session - 02/25/18 0948    Visit Number  3    Number of Visits  19    Date for PT Re-Evaluation  04/05/18   Minireassess 03/15/18   Authorization Type  [employer]    Authorization Time Period  02/22/18 to 04/05/18    Authorization - Visit Number  3    Authorization - Number of Visits  10    PT Start Time  0900    PT Stop Time  0943    PT Time Calculation (min)  43 min    Activity Tolerance  Patient tolerated treatment well    Behavior During Therapy  [facility] for tasks assessed/performed       Past Medical History:  Diagnosis Date  . Anxiety   . Arthritis   . Asthma   . Depression   . Eczema   . Fibromyalgia   . GERD (gastroesophageal reflux disease)    EGD/ colon 1/09  . H pylori ulcer 1980-1990   s/p treatment  . Headache   . Hyperlipidemia   . IBS (irritable bowel syndrome)   . Laryngospasm   . Neck pain   . OSA (obstructive sleep apnea)   . PONV (postoperative nausea and vomiting)   . Refusal of blood transfusions as patient is Jehovah's Witness   . Vertigo     Past Surgical History:  Procedure Laterality Date  . ABDOMINAL HYSTERECTOMY  1970  . APPENDECTOMY    . BILATERAL SALPINGOOPHORECTOMY  1990s  . CARDIAC CATHETERIZATION  2002   [facility]) normal coronary arteries   . COLONOSCOPY  01/2007   Dr. Meriel Flavors  . COLONOSCOPY N/A 04/18/2015   Procedure: COLONOSCOPY;  Surgeon: Rogene Houston, MD;  [facility];  Service: Endoscopy;  Laterality: N/A;  830 - moved to 8:55 - Ann to notify pt  . COLONOSCOPY WITH ESOPHAGOGASTRODUODENOSCOPY (EGD)  02/16/2012   Procedure: COLONOSCOPY WITH  ESOPHAGOGASTRODUODENOSCOPY (EGD);  Surgeon: Daneil Dolin, MD;  [facility];  Service: Endoscopy;  Laterality: N/A;  8:45  . ESOPHAGEAL DILATION N/A 06/07/2014   Procedure: ESOPHAGEAL DILATION;  Surgeon: Rogene Houston, MD;  [facility];  Service: Endoscopy;  Laterality: N/A;  . ESOPHAGOGASTRODUODENOSCOPY  01/2007   Dr Sharlett Iles- 3 cm hiatal hernia, benign esophageal biopsies, erosive esophagitis, gastritis  . ESOPHAGOGASTRODUODENOSCOPY N/A 06/07/2014   Procedure: ESOPHAGOGASTRODUODENOSCOPY (EGD);  Surgeon: Rogene Houston, MD;  [facility];  Service: Endoscopy;  Laterality: N/A;  1200  . ESOPHAGOGASTRODUODENOSCOPY N/A 08/18/2017   Procedure: ESOPHAGOGASTRODUODENOSCOPY (EGD);  Surgeon: Rogene Houston, MD;  [facility];  Service: Endoscopy;  Laterality: N/A;  2:20  . FOOT SURGERY    . INNER EAR SURGERY     Left  . KNEE SURGERY     Multiple  . SHOULDER SURGERY  2011  . TONSILLECTOMY    . TOTAL KNEE ARTHROPLASTY Left 02/07/2018   Procedure: TOTAL KNEE ARTHROPLASTY;  Surgeon: Gaynelle Arabian, MD;  [facility];  Service: Orthopedics;  Laterality: Left;  64min  . TUBAL LIGATION    . TYMPANOSTOMY TUBE PLACEMENT      There were no vitals filed for  this visit.  Subjective Assessment - 02/25/18 0911    Subjective  Patient arrived ambulating with RW and arrived with husband. Patient reported that her pain is an 8/10 today.     Patient Stated Goals  be able to function and stand and walk, potentially squat    Currently in Pain?  Yes    Pain Score  8     Pain Orientation  Left    Pain Descriptors / Indicators  Aching    Pain Type  Surgical pain    Pain Onset  1 to 4 weeks ago                       [facility] Adult PT Treatment/Exercise - 02/25/18 0001      Knee/Hip Exercises: Seated   Heel Slides  10 reps      Knee/Hip Exercises: Supine   Quad Sets  10 reps    Short Arc Quad Sets  AROM;Strengthening;Left;1 set;10 reps     Short Arc Quad Sets Limitations  2-3 seconds with knee on top of foam roller    Heel Slides  10 reps    Heel Slides Limitations  Holding for several seconds before scooting foot back further    Knee Extension  AROM    Knee Extension Limitations  11    Knee Flexion Limitations  44    Other Supine Knee/Hip Exercises  Ankle pumps in supine to decrease edema x30      Manual Therapy   Manual Therapy  Edema management    Manual therapy comments  Manual complete separate from rest of tx    Edema Management  Retrograde massage with LE elevated             PT Education - 02/25/18 0947    Education Details  Discussed alternate option to perform hamstring stretch on a step. Discussed PRICE again and discussed methods for leg elevation as well as benefits of continuing ankle pumps at home. Also demonstrated proper manual technique for edema reduction.     Person(s) Educated  Patient;Spouse    Methods  Explanation;Demonstration    Comprehension  Verbalized understanding       PT Short Term Goals - 02/22/18 1256      PT SHORT TERM GOAL #1   Title  Pt will be independent with and consistently perform HEP to improve ROM and reduce pain.     Time  3    Period  Weeks    Status  New    Target Date  03/15/18      PT SHORT TERM GOAL #2   Title  Pt will have reduced edema by 2cm or > at joint line in order to maximize ROM and reduce pain.     Time  3    Period  Weeks    Status  New      PT SHORT TERM GOAL #3   Title  Pt will have improved L knee AROM from 5 to 75 deg in order to reduce pain and maximize gait.     Time  3    Period  Weeks    Status  New        PT Long Term Goals - 02/22/18 1257      PT LONG TERM GOAL #1   Title  Pt will have improved L knee AROM from 0 to 110deg in order to further reduce pain and maximize stair ambulation.     Time  6    Period  Weeks    Status  New    Target Date  04/05/18      PT LONG TERM GOAL #2   Title  Pt will have improved MMT to 4/5  or > throughout mm groups tested in order to maximize gait and functional mobility.     Time  6    Period  Weeks    Status  New      PT LONG TERM GOAL #3   Title  Pt will be able to perform 12STS during 30sec chair rise test without UE support and proper form in order to demo improved balance and functional strength.     Time  6    Period  Weeks    Status  New      PT LONG TERM GOAL #4   Title  Pt will be able to perform L SLS for 10sec or > to demo improved balance, core and functional strength in order to maximize gait, stairs, and other functional mobility.     Time  6    Period  Weeks    Status  New      PT LONG TERM GOAL #5   Title  Pt will be able to ambulate at least 448ft during 3MWT with LRAD, 2/10 L knee pain, and gait WFL in order to demo improved tolerance to functional mobility and maximize her HH ambulation.    Time  6    Period  Weeks    Status  New            Plan - 02/25/18 0949    Clinical Impression Statement  This session continued with established plan of care with focus on improving patient's knee AROM and decreasing edema. This session added short arc quad exercise and ankle pumps. Patient's left knee AROM ranged from 11 degrees to 44 degrees. Patient would benefit from continued skilled physical therapy in order to continue progressing towards functional goals.     Rehab Potential  Fair    PT Frequency  3x / week    PT Duration  6 weeks    PT Treatment/Interventions  ADLs/Self Care Home Management;Aquatic Therapy;Cryotherapy;Electrical Stimulation;Moist Heat;Gait training;DME Instruction;Stair training;Functional mobility training;Therapeutic activities;Therapeutic exercise;Balance training;Neuromuscular re-education;Patient/family education;Manual techniques;Compression bandaging;Scar mobilization;Orthotic Fit/Training;Passive range of motion;Dry needling;Energy conservation;Taping    PT Next Visit Plan  heavy focus on AROM, particularly flexion, and edema  initially    PT Home Exercise Plan  eval: quad set, supine heel slide, seated HS stretch, seated knee flexion, hooklying SKTC (husband to assist)       Patient will benefit from skilled therapeutic intervention in order to improve the following deficits and impairments:  Abnormal gait, Decreased activity tolerance, Decreased balance, Decreased endurance, Decreased mobility, Decreased range of motion, Decreased scar mobility, Decreased strength, Difficulty walking, Hypomobility, Increased edema, Increased fascial restricitons, Increased muscle spasms, Impaired flexibility, Improper body mechanics, Postural dysfunction, Obesity, Pain  Visit Diagnosis: Acute pain of left knee  Stiffness of left knee, not elsewhere classified  Localized edema  Muscle weakness (generalized)     Problem List Patient Active Problem List   Diagnosis Date Noted  . OA (osteoarthritis) of knee 02/07/2018  . Other allergic contact dermatitis 12/20/2017  . Non-allergic rhinitis 12/16/2017  . Allergy to metal 12/16/2017  . Gastroesophageal reflux disease without esophagitis 07/15/2017  . Primary osteoarthritis of both feet 04/29/2017  . Family history of early CAD 04/15/2016  . Primary osteoarthritis of both knees 12/31/2015  .  Chest pain 07/13/2015  . Atypical chest pain 07/13/2015  . Major depression 03/19/2015  . Vulvodynia 08/03/2014  . Functional constipation 08/03/2014  . Anxiety 07/06/2014  . Fibromyalgia 04/25/2014  . Snoring 04/22/2014  . Prediabetes 03/18/2014  . Hyperlipidemia 03/18/2014  . Osteopenia 03/06/2014  . History of cardiac catheterization 04/28/2013  . Dyspepsia 02/02/2012  . GERD (gastroesophageal reflux disease) 02/02/2012  . Depression 06/18/2011  . Dysphagia 02/05/2011  . CHEST PAIN 02/11/2009  . GASTROESOPHAGEAL REFLUX DISEASE, CHRONIC 02/15/2007  . IRRITABLE BOWEL SYNDROME 02/15/2007  . Primary osteoarthritis of both hands 02/15/2007  . Reflux esophagitis 02/14/2007  .  GASTRITIS 02/14/2007   Clarene Critchley PT, DPT 9:50 AM, 02/25/18 [place] [address] [place], [place], [address] Phone: [phone]   Fax:  [phone]  Name: Ruth [name] MRN: [id number] Date of Birth: Dec 01, 1940

## 2018-02-28 ENCOUNTER — Telehealth (HOSPITAL_COMMUNITY): Payer: Self-pay

## 2018-02-28 ENCOUNTER — Ambulatory Visit (HOSPITAL_COMMUNITY): Payer: PPO

## 2018-02-28 ENCOUNTER — Encounter (HOSPITAL_COMMUNITY): Payer: Self-pay

## 2018-02-28 DIAGNOSIS — M25662 Stiffness of left knee, not elsewhere classified: Secondary | ICD-10-CM

## 2018-02-28 DIAGNOSIS — R600 Localized edema: Secondary | ICD-10-CM

## 2018-02-28 DIAGNOSIS — M25562 Pain in left knee: Secondary | ICD-10-CM | POA: Diagnosis not present

## 2018-02-28 DIAGNOSIS — R110 Nausea: Secondary | ICD-10-CM | POA: Diagnosis not present

## 2018-02-28 DIAGNOSIS — M6281 Muscle weakness (generalized): Secondary | ICD-10-CM

## 2018-02-28 DIAGNOSIS — R42 Dizziness and giddiness: Secondary | ICD-10-CM | POA: Diagnosis not present

## 2018-02-28 NOTE — Telephone Encounter (Signed)
Called Dr. Anne Fu office regarding pt's treatment session today. Informed them of pt's comments regarding potential opioid addiction and potential arthrofibrosis of L knee. Individual made note of these, provided my contact information, and stated she would send it to her MD and PA-C.   Geraldine Solar PT, DPT

## 2018-02-28 NOTE — Therapy (Signed)
[facility] [place], [place], [address] Phone: [phone]   Fax:  [phone]  Physical Therapy Treatment  Patient Details  Name: Ruth [name] MRN: [id number] Date of Birth: 07/04/1940 Referring Provider (PT): Theresa Duty, PA-C (Surgeon: Gaynelle Arabian, MD)   Encounter Date: 02/28/2018  PT End of Session - 02/28/18 1040    Visit Number  4    Number of Visits  19    Date for PT Re-Evaluation  04/05/18   Minireassess 03/15/18   Authorization Type  [employer]    Authorization Time Period  02/22/18 to 04/05/18    Authorization - Visit Number  4    Authorization - Number of Visits  10    PT Start Time  1030    PT Stop Time  1116    PT Time Calculation (min)  46 min    Activity Tolerance  Patient tolerated treatment well    Behavior During Therapy  [facility] for tasks assessed/performed       Past Medical History:  Diagnosis Date  . Anxiety   . Arthritis   . Asthma   . Depression   . Eczema   . Fibromyalgia   . GERD (gastroesophageal reflux disease)    EGD/ colon 1/09  . H pylori ulcer 1980-1990   s/p treatment  . Headache   . Hyperlipidemia   . IBS (irritable bowel syndrome)   . Laryngospasm   . Neck pain   . OSA (obstructive sleep apnea)   . PONV (postoperative nausea and vomiting)   . Refusal of blood transfusions as patient is Jehovah's Witness   . Vertigo     Past Surgical History:  Procedure Laterality Date  . ABDOMINAL HYSTERECTOMY  1970  . APPENDECTOMY    . BILATERAL SALPINGOOPHORECTOMY  1990s  . CARDIAC CATHETERIZATION  2002   [facility]) normal coronary arteries   . COLONOSCOPY  01/2007   Dr. Meriel Flavors  . COLONOSCOPY N/A 04/18/2015   Procedure: COLONOSCOPY;  Surgeon: Rogene Houston, MD;  [facility];  Service: Endoscopy;  Laterality: N/A;  830 - moved to 8:55 - Ann to notify pt  . COLONOSCOPY WITH ESOPHAGOGASTRODUODENOSCOPY (EGD)  02/16/2012   Procedure: COLONOSCOPY WITH  ESOPHAGOGASTRODUODENOSCOPY (EGD);  Surgeon: Daneil Dolin, MD;  [facility];  Service: Endoscopy;  Laterality: N/A;  8:45  . ESOPHAGEAL DILATION N/A 06/07/2014   Procedure: ESOPHAGEAL DILATION;  Surgeon: Rogene Houston, MD;  [facility];  Service: Endoscopy;  Laterality: N/A;  . ESOPHAGOGASTRODUODENOSCOPY  01/2007   Dr Sharlett Iles- 3 cm hiatal hernia, benign esophageal biopsies, erosive esophagitis, gastritis  . ESOPHAGOGASTRODUODENOSCOPY N/A 06/07/2014   Procedure: ESOPHAGOGASTRODUODENOSCOPY (EGD);  Surgeon: Rogene Houston, MD;  [facility];  Service: Endoscopy;  Laterality: N/A;  1200  . ESOPHAGOGASTRODUODENOSCOPY N/A 08/18/2017   Procedure: ESOPHAGOGASTRODUODENOSCOPY (EGD);  Surgeon: Rogene Houston, MD;  [facility];  Service: Endoscopy;  Laterality: N/A;  2:20  . FOOT SURGERY    . INNER EAR SURGERY     Left  . KNEE SURGERY     Multiple  . SHOULDER SURGERY  2011  . TONSILLECTOMY    . TOTAL KNEE ARTHROPLASTY Left 02/07/2018   Procedure: TOTAL KNEE ARTHROPLASTY;  Surgeon: Gaynelle Arabian, MD;  [facility];  Service: Orthopedics;  Laterality: Left;  68min  . TUBAL LIGATION    . TYMPANOSTOMY TUBE PLACEMENT      There were no vitals filed for  this visit.  Subjective Assessment - 02/28/18 1040    Subjective  Pt states that she didn't sleep well at last night due to her medications making her feel anxious and jittery. L knee pain relatively well controlled though at 4/10 currently.     Patient Stated Goals  be able to function and stand and walk, potentially squat    Currently in Pain?  Yes    Pain Score  4     Pain Location  Knee    Pain Orientation  Left    Pain Descriptors / Indicators  Aching    Pain Type  Surgical pain    Pain Onset  1 to 4 weeks ago    Pain Frequency  Constant    Aggravating Factors   WB,  standing, walking, bending    Pain Relieving Factors  pain medicine, ice            [facility] Adult PT  Treatment/Exercise - 02/28/18 0001      Knee/Hip Exercises: Stretches   Active Hamstring Stretch  Left;3 reps;30 seconds    Active Hamstring Stretch Limitations  standing, 6" step    Knee: Self-Stretch to increase Flexion  Left    Knee: Self-Stretch Limitations  10x10" holds 6" step    Gastroc Stretch  Both;3 reps;30 seconds    Gastroc Stretch Limitations  slant board      Knee/Hip Exercises: Aerobic   Stationary Bike  x3 mins, seat 16, 1/2 revolutions for ROM      Knee/Hip Exercises: Standing   Gait Training  x1 lap with RW focusing on heel to toe gait      Knee/Hip Exercises: Seated   Heel Slides  Left;10 reps    Heel Slides Limitations  3-5" holds      Manual Therapy   Manual Therapy  Edema management;Soft tissue mobilization;Joint mobilization;Passive ROM    Manual therapy comments  Manual complete separate from rest of tx    Edema Management  Retrograde massage with LE elevated    Joint Mobilization  Patella mobs all direction for ROM    Soft tissue mobilization  STM to distal quad/proximal scar to reduce restrictions and improve ROM    Passive ROM  attempted PROM L knee flexion to pt tolerance and to assess end feel -- still limted due to pt guarding but appeared to have hard end feel at end range               PT Short Term Goals - 02/22/18 1256      PT SHORT TERM GOAL #1   Title  Pt will be independent with and consistently perform HEP to improve ROM and reduce pain.     Time  3    Period  Weeks    Status  New    Target Date  03/15/18      PT SHORT TERM GOAL #2   Title  Pt will have reduced edema by 2cm or > at joint line in order to maximize ROM and reduce pain.     Time  3    Period  Weeks    Status  New      PT SHORT TERM GOAL #3   Title  Pt will have improved L knee AROM from 5 to 75 deg in order to reduce pain and maximize gait.     Time  3    Period  Weeks    Status  New  PT Long Term Goals - 02/22/18 1257      PT LONG TERM GOAL #1    Title  Pt will have improved L knee AROM from 0 to 110deg in order to further reduce pain and maximize stair ambulation.     Time  6    Period  Weeks    Status  New    Target Date  04/05/18      PT LONG TERM GOAL #2   Title  Pt will have improved MMT to 4/5 or > throughout mm groups tested in order to maximize gait and functional mobility.     Time  6    Period  Weeks    Status  New      PT LONG TERM GOAL #3   Title  Pt will be able to perform 12STS during 30sec chair rise test without UE support and proper form in order to demo improved balance and functional strength.     Time  6    Period  Weeks    Status  New      PT LONG TERM GOAL #4   Title  Pt will be able to perform L SLS for 10sec or > to demo improved balance, core and functional strength in order to maximize gait, stairs, and other functional mobility.     Time  6    Period  Weeks    Status  New      PT LONG TERM GOAL #5   Title  Pt will be able to ambulate at least 470ft during 3MWT with LRAD, 2/10 L knee pain, and gait WFL in order to demo improved tolerance to functional mobility and maximize her HH ambulation.    Time  6    Period  Weeks    Status  New            Plan - 02/28/18 1210    Clinical Impression Statement  Pt presents to therapy stating that she had a rough night due to anxiety, jitteriness, and overall just uncomfortable feeling. She verbalized to therapist that she is afraid she may be addicted to her pain medication. Pt stating that she called her Dr. Anne Fu office regarding this earlier this morning and talked to a lady on the phone but she is not sure who. Pt stated that she told her to just stop taking her medication, however, pt not sure she can. This PT will call her surgeon's office today to discuss this with them and hopefully get her an appointment to see him earlier than her currently scheduled 03/15/18 f/u visit. Pt continues to significantly lack flexion and even though she is still  guarded with A/AROM/PROM, it appears that she has a hard end feel at her end range, which is likely due to potential arthrofibrosis. PT educated pt and her husband on this and explained that if her knee does not move more and if she can't tolerate more aggressive ROM work, she will likely develop arthrofibrosis. She stated that Dr. Wynelle Link has already told her that if her knee ROM wasn't better when she f/u with him at the end of January that she would likely need a MUA. AROM 10-53deg this date.     Rehab Potential  Fair    PT Frequency  3x / week    PT Duration  6 weeks    PT Treatment/Interventions  ADLs/Self Care Home Management;Aquatic Therapy;Cryotherapy;Electrical Stimulation;Moist Heat;Gait training;DME Instruction;Stair training;Functional mobility training;Therapeutic activities;Therapeutic exercise;Balance training;Neuromuscular re-education;Patient/family education;Manual techniques;Compression  bandaging;Scar mobilization;Orthotic Fit/Training;Passive range of motion;Dry needling;Energy conservation;Taping    PT Next Visit Plan  heavy focus on AROM, particularly flexion, and edema initially    PT Home Exercise Plan  eval: quad set, supine heel slide, seated HS stretch, seated knee flexion, hooklying SKTC (husband to assist)    Consulted and Agree with Plan of Care  Patient;Family member/caregiver    Family Member Consulted  husband       Patient will benefit from skilled therapeutic intervention in order to improve the following deficits and impairments:  Abnormal gait, Decreased activity tolerance, Decreased balance, Decreased endurance, Decreased mobility, Decreased range of motion, Decreased scar mobility, Decreased strength, Difficulty walking, Hypomobility, Increased edema, Increased fascial restricitons, Increased muscle spasms, Impaired flexibility, Improper body mechanics, Postural dysfunction, Obesity, Pain  Visit Diagnosis: Acute pain of left knee  Stiffness of left knee, not  elsewhere classified  Localized edema  Muscle weakness (generalized)     Problem List Patient Active Problem List   Diagnosis Date Noted  . OA (osteoarthritis) of knee 02/07/2018  . Other allergic contact dermatitis 12/20/2017  . Non-allergic rhinitis 12/16/2017  . Allergy to metal 12/16/2017  . Gastroesophageal reflux disease without esophagitis 07/15/2017  . Primary osteoarthritis of both feet 04/29/2017  . Family history of early CAD 04/15/2016  . Primary osteoarthritis of both knees 12/31/2015  . Chest pain 07/13/2015  . Atypical chest pain 07/13/2015  . Major depression 03/19/2015  . Vulvodynia 08/03/2014  . Functional constipation 08/03/2014  . Anxiety 07/06/2014  . Fibromyalgia 04/25/2014  . Snoring 04/22/2014  . Prediabetes 03/18/2014  . Hyperlipidemia 03/18/2014  . Osteopenia 03/06/2014  . History of cardiac catheterization 04/28/2013  . Dyspepsia 02/02/2012  . GERD (gastroesophageal reflux disease) 02/02/2012  . Depression 06/18/2011  . Dysphagia 02/05/2011  . CHEST PAIN 02/11/2009  . GASTROESOPHAGEAL REFLUX DISEASE, CHRONIC 02/15/2007  . IRRITABLE BOWEL SYNDROME 02/15/2007  . Primary osteoarthritis of both hands 02/15/2007  . Reflux esophagitis 02/14/2007  . GASTRITIS 02/14/2007        Geraldine Solar PT, DPT  [facility] [address] [place], [place], [address] Phone: [phone]   Fax:  [phone]  Name: KAMEREN BAADE MRN: [id number] Date of Birth: Nov 19, 1940

## 2018-03-01 ENCOUNTER — Telehealth (HOSPITAL_COMMUNITY): Payer: Self-pay

## 2018-03-01 NOTE — Telephone Encounter (Signed)
Ruth Duty, PA-C returned call to clinic and spoke to me about this pt. She stated that they saw [name] last week and know that she is limited with her knee but Dr. Wynelle Link still won't see her until the 5-week post-op visit and at that point they will decide on a manipulation or not. She stated that she also called the pt yesterday regarding her comments about her pain medicine. Will continue to push pt's ROM as tolerated until her f/u on 03/15/18 with Dr. Wynelle Link.   Geraldine Solar PT, DPT

## 2018-03-02 ENCOUNTER — Ambulatory Visit (HOSPITAL_COMMUNITY): Payer: PPO

## 2018-03-02 ENCOUNTER — Encounter (HOSPITAL_COMMUNITY): Payer: Self-pay

## 2018-03-02 DIAGNOSIS — R600 Localized edema: Secondary | ICD-10-CM

## 2018-03-02 DIAGNOSIS — M25562 Pain in left knee: Secondary | ICD-10-CM

## 2018-03-02 DIAGNOSIS — M6281 Muscle weakness (generalized): Secondary | ICD-10-CM

## 2018-03-02 DIAGNOSIS — M25662 Stiffness of left knee, not elsewhere classified: Secondary | ICD-10-CM

## 2018-03-02 NOTE — Therapy (Signed)
[place], [place], [address] Phone: [phone]   Fax:  [phone]  Physical Therapy Treatment  Patient Details  Name: Ruth [name] MRN: [id number] Date of Birth: Oct 05, 1940 Referring Provider (PT): Theresa Duty, PA-C (Surgeon: Gaynelle Arabian, MD)   Encounter Date: 03/02/2018  PT End of Session - 03/02/18 1038    Visit Number  5    Number of Visits  19    Date for PT Re-Evaluation  04/05/18   Minireassess 03/15/18   Authorization Type  [employer]    Authorization Time Period  02/22/18 to 04/05/18    Authorization - Visit Number  5    Authorization - Number of Visits  10    PT Start Time  1030    PT Stop Time  1118    PT Time Calculation (min)  48 min    Activity Tolerance  Patient tolerated treatment well    Behavior During Therapy  [facility] for tasks assessed/performed       Past Medical History:  Diagnosis Date  . Anxiety   . Arthritis   . Asthma   . Depression   . Eczema   . Fibromyalgia   . GERD (gastroesophageal reflux disease)    EGD/ colon 1/09  . H pylori ulcer 1980-1990   s/p treatment  . Headache   . Hyperlipidemia   . IBS (irritable bowel syndrome)   . Laryngospasm   . Neck pain   . OSA (obstructive sleep apnea)   . PONV (postoperative nausea and vomiting)   . Refusal of blood transfusions as patient is Jehovah's Witness   . Vertigo     Past Surgical History:  Procedure Laterality Date  . ABDOMINAL HYSTERECTOMY  1970  . APPENDECTOMY    . BILATERAL SALPINGOOPHORECTOMY  1990s  . CARDIAC CATHETERIZATION  2002   [facility]) normal coronary arteries   . COLONOSCOPY  01/2007   Dr. Meriel Flavors  . COLONOSCOPY N/A 04/18/2015   Procedure: COLONOSCOPY;  Surgeon: Rogene Houston, MD;  [facility];  Service: Endoscopy;  Laterality: N/A;  830 - moved to 8:55 - Ann to notify pt  . COLONOSCOPY WITH ESOPHAGOGASTRODUODENOSCOPY (EGD)  02/16/2012   Procedure: COLONOSCOPY WITH  ESOPHAGOGASTRODUODENOSCOPY (EGD);  Surgeon: Daneil Dolin, MD;  [facility];  Service: Endoscopy;  Laterality: N/A;  8:45  . ESOPHAGEAL DILATION N/A 06/07/2014   Procedure: ESOPHAGEAL DILATION;  Surgeon: Rogene Houston, MD;  [facility];  Service: Endoscopy;  Laterality: N/A;  . ESOPHAGOGASTRODUODENOSCOPY  01/2007   Dr Sharlett Iles- 3 cm hiatal hernia, benign esophageal biopsies, erosive esophagitis, gastritis  . ESOPHAGOGASTRODUODENOSCOPY N/A 06/07/2014   Procedure: ESOPHAGOGASTRODUODENOSCOPY (EGD);  Surgeon: Rogene Houston, MD;  [facility];  Service: Endoscopy;  Laterality: N/A;  1200  . ESOPHAGOGASTRODUODENOSCOPY N/A 08/18/2017   Procedure: ESOPHAGOGASTRODUODENOSCOPY (EGD);  Surgeon: Rogene Houston, MD;  [facility];  Service: Endoscopy;  Laterality: N/A;  2:20  . FOOT SURGERY    . INNER EAR SURGERY     Left  . KNEE SURGERY     Multiple  . SHOULDER SURGERY  2011  . TONSILLECTOMY    . TOTAL KNEE ARTHROPLASTY Left 02/07/2018   Procedure: TOTAL KNEE ARTHROPLASTY;  Surgeon: Gaynelle Arabian, MD;  [facility];  Service: Orthopedics;  Laterality: Left;  66min  . TUBAL LIGATION    . TYMPANOSTOMY TUBE PLACEMENT      There were no vitals filed for  this visit.  Subjective Assessment - 03/02/18 1039    Subjective  Pt reports feeling a little more clear headed today. L knee pain 6/10 this morning.     Patient Stated Goals  be able to function and stand and walk, potentially squat    Currently in Pain?  Yes    Pain Score  6     Pain Location  Knee    Pain Orientation  Left    Pain Descriptors / Indicators  Aching    Pain Type  Surgical pain    Pain Onset  1 to 4 weeks ago    Pain Frequency  Constant    Aggravating Factors   WB, standing, walking, bending    Pain Relieving Factors  pain medicine, ice    Effect of Pain on Daily Activities  increases          [facility] Adult PT Treatment/Exercise - 03/02/18 0001      Knee/Hip Exercises:  Aerobic   Stationary Bike  x4 mins, seat 14, 1/2 revolutions for ROM      Knee/Hip Exercises: Standing   Gait Training  x1 lap with RW focusing on heel to toe gait      Modalities   Modalities  Cryotherapy      Cryotherapy   Number Minutes Cryotherapy  10 Minutes    Cryotherapy Location  Knee;Hip    Type of Cryotherapy  Ice pack      Manual Therapy   Manual Therapy  Edema management    Manual therapy comments  Manual complete separate from rest of tx    Edema Management  Retrograde massage with LE elevated           PT Education - 03/02/18 1038    Education Details  exercise technique, continue HEP    Person(s) Educated  Patient;Spouse    Methods  Explanation;Demonstration    Comprehension  Verbalized understanding;Returned demonstration       PT Short Term Goals - 02/22/18 1256      PT SHORT TERM GOAL #1   Title  Pt will be independent with and consistently perform HEP to improve ROM and reduce pain.     Time  3    Period  Weeks    Status  New    Target Date  03/15/18      PT SHORT TERM GOAL #2   Title  Pt will have reduced edema by 2cm or > at joint line in order to maximize ROM and reduce pain.     Time  3    Period  Weeks    Status  New      PT SHORT TERM GOAL #3   Title  Pt will have improved L knee AROM from 5 to 75 deg in order to reduce pain and maximize gait.     Time  3    Period  Weeks    Status  New        PT Long Term Goals - 02/22/18 1257      PT LONG TERM GOAL #1   Title  Pt will have improved L knee AROM from 0 to 110deg in order to further reduce pain and maximize stair ambulation.     Time  6    Period  Weeks    Status  New    Target Date  04/05/18      PT LONG TERM GOAL #2   Title  Pt will have improved MMT to 4/5  or > throughout mm groups tested in order to maximize gait and functional mobility.     Time  6    Period  Weeks    Status  New      PT LONG TERM GOAL #3   Title  Pt will be able to perform 12STS during 30sec chair  rise test without UE support and proper form in order to demo improved balance and functional strength.     Time  6    Period  Weeks    Status  New      PT LONG TERM GOAL #4   Title  Pt will be able to perform L SLS for 10sec or > to demo improved balance, core and functional strength in order to maximize gait, stairs, and other functional mobility.     Time  6    Period  Weeks    Status  New      PT LONG TERM GOAL #5   Title  Pt will be able to ambulate at least 472ft during 3MWT with LRAD, 2/10 L knee pain, and gait WFL in order to demo improved tolerance to functional mobility and maximize her HH ambulation.    Time  6    Period  Weeks    Status  New            Plan - 03/02/18 1127    Clinical Impression Statement  Began session with gait with RW focusing on heel to toe gait pattern. Continued with stationary bike today and on pt's last rep of 1/2 revolution backwards, her L knee ended up making a full retro revolution and pt had increased L knee pain and felt she couldn't get her foot out of the strap. However, PT was directly next to pt when this occurred and was able to slide the seat back for her to lower her L leg out of the strap and to the ground. Pt reporting increased pain following in NWB and WB; PT educated that she would likely have increased soreness following and to ice her knee a little extra today and tomorrow to help reduce this. Rest of session focused on manual techniques to reduce pain and then x10 mins ice at the end for reduced pain. Pt reporting that her knee felt better at EOS.     Rehab Potential  Fair    PT Frequency  3x / week    PT Duration  6 weeks    PT Treatment/Interventions  ADLs/Self Care Home Management;Aquatic Therapy;Cryotherapy;Electrical Stimulation;Moist Heat;Gait training;DME Instruction;Stair training;Functional mobility training;Therapeutic activities;Therapeutic exercise;Balance training;Neuromuscular re-education;Patient/family  education;Manual techniques;Compression bandaging;Scar mobilization;Orthotic Fit/Training;Passive range of motion;Dry needling;Energy conservation;Taping    PT Next Visit Plan  f/u on how pt felt after bike; heavy focus on AROM, particularly flexion, and edema initially    PT Home Exercise Plan  eval: quad set, supine heel slide, seated HS stretch, seated knee flexion, hooklying SKTC (husband to assist)    Consulted and Agree with Plan of Care  Patient;Family member/caregiver    Family Member Consulted  husband       Patient will benefit from skilled therapeutic intervention in order to improve the following deficits and impairments:  Abnormal gait, Decreased activity tolerance, Decreased balance, Decreased endurance, Decreased mobility, Decreased range of motion, Decreased scar mobility, Decreased strength, Difficulty walking, Hypomobility, Increased edema, Increased fascial restricitons, Increased muscle spasms, Impaired flexibility, Improper body mechanics, Postural dysfunction, Obesity, Pain  Visit Diagnosis: Acute pain of left knee  Stiffness of left  knee, not elsewhere classified  Localized edema  Muscle weakness (generalized)     Problem List Patient Active Problem List   Diagnosis Date Noted  . OA (osteoarthritis) of knee 02/07/2018  . Other allergic contact dermatitis 12/20/2017  . Non-allergic rhinitis 12/16/2017  . Allergy to metal 12/16/2017  . Gastroesophageal reflux disease without esophagitis 07/15/2017  . Primary osteoarthritis of both feet 04/29/2017  . Family history of early CAD 04/15/2016  . Primary osteoarthritis of both knees 12/31/2015  . Chest pain 07/13/2015  . Atypical chest pain 07/13/2015  . Major depression 03/19/2015  . Vulvodynia 08/03/2014  . Functional constipation 08/03/2014  . Anxiety 07/06/2014  . Fibromyalgia 04/25/2014  . Snoring 04/22/2014  . Prediabetes 03/18/2014  . Hyperlipidemia 03/18/2014  . Osteopenia 03/06/2014  . History of  cardiac catheterization 04/28/2013  . Dyspepsia 02/02/2012  . GERD (gastroesophageal reflux disease) 02/02/2012  . Depression 06/18/2011  . Dysphagia 02/05/2011  . CHEST PAIN 02/11/2009  . GASTROESOPHAGEAL REFLUX DISEASE, CHRONIC 02/15/2007  . IRRITABLE BOWEL SYNDROME 02/15/2007  . Primary osteoarthritis of both hands 02/15/2007  . Reflux esophagitis 02/14/2007  . GASTRITIS 02/14/2007        Geraldine Solar PT, DPT  [facility] [address] [place], [place], [address] Phone: [phone]   Fax:  [phone]  Name: ADDYSON TRAUB MRN: [id number] Date of Birth: [birth year]

## 2018-03-04 ENCOUNTER — Ambulatory Visit (HOSPITAL_COMMUNITY): Payer: PPO

## 2018-03-04 ENCOUNTER — Telehealth: Payer: Self-pay | Admitting: Family Medicine

## 2018-03-04 ENCOUNTER — Other Ambulatory Visit: Payer: Self-pay | Admitting: Family Medicine

## 2018-03-04 DIAGNOSIS — M6281 Muscle weakness (generalized): Secondary | ICD-10-CM

## 2018-03-04 DIAGNOSIS — M25562 Pain in left knee: Secondary | ICD-10-CM | POA: Diagnosis not present

## 2018-03-04 DIAGNOSIS — R600 Localized edema: Secondary | ICD-10-CM

## 2018-03-04 DIAGNOSIS — M25662 Stiffness of left knee, not elsewhere classified: Secondary | ICD-10-CM

## 2018-03-04 MED ORDER — DIAZEPAM 5 MG PO TABS: 5.0000 mg | ORAL_TABLET | Freq: Two times a day (BID) | ORAL | 1 refills | Status: DC | PRN

## 2018-03-04 NOTE — Telephone Encounter (Signed)
Please advise. Thank you

## 2018-03-04 NOTE — Therapy (Signed)
[place], [place], [address] Phone: [phone]   Fax:  [phone]  Physical Therapy Treatment  Patient Details  Name: Ruth [name] MRN: [id number] Date of Birth: 1940/11/14 Referring Provider (PT): Theresa Duty, PA-C (Surgeon: Gaynelle Arabian, MD)   Encounter Date: 03/04/2018  PT End of Session - 03/04/18 1435    Visit Number  6    Number of Visits  19    Date for PT Re-Evaluation  04/05/18   Minireassess 03/15/18   Authorization Type  [employer]    Authorization Time Period  02/22/18 to 04/05/18    Authorization - Visit Number  6    Authorization - Number of Visits  10    PT Start Time  4196    PT Stop Time  1519    PT Time Calculation (min)  44 min    Activity Tolerance  Patient tolerated treatment well    Behavior During Therapy  [facility] for tasks assessed/performed       Past Medical History:  Diagnosis Date  . Anxiety   . Arthritis   . Asthma   . Depression   . Eczema   . Fibromyalgia   . GERD (gastroesophageal reflux disease)    EGD/ colon 1/09  . H pylori ulcer 1980-1990   s/p treatment  . Headache   . Hyperlipidemia   . IBS (irritable bowel syndrome)   . Laryngospasm   . Neck pain   . OSA (obstructive sleep apnea)   . PONV (postoperative nausea and vomiting)   . Refusal of blood transfusions as patient is Jehovah's Witness   . Vertigo     Past Surgical History:  Procedure Laterality Date  . ABDOMINAL HYSTERECTOMY  1970  . APPENDECTOMY    . BILATERAL SALPINGOOPHORECTOMY  1990s  . CARDIAC CATHETERIZATION  2002   [facility]) normal coronary arteries   . COLONOSCOPY  01/2007   Dr. Meriel Flavors  . COLONOSCOPY N/A 04/18/2015   Procedure: COLONOSCOPY;  Surgeon: Rogene Houston, MD;  [facility];  Service: Endoscopy;  Laterality: N/A;  830 - moved to 8:55 - Ann to notify pt  . COLONOSCOPY WITH ESOPHAGOGASTRODUODENOSCOPY (EGD)  02/16/2012   Procedure: COLONOSCOPY WITH  ESOPHAGOGASTRODUODENOSCOPY (EGD);  Surgeon: Daneil Dolin, MD;  [facility];  Service: Endoscopy;  Laterality: N/A;  8:45  . ESOPHAGEAL DILATION N/A 06/07/2014   Procedure: ESOPHAGEAL DILATION;  Surgeon: Rogene Houston, MD;  [facility];  Service: Endoscopy;  Laterality: N/A;  . ESOPHAGOGASTRODUODENOSCOPY  01/2007   Dr Sharlett Iles- 3 cm hiatal hernia, benign esophageal biopsies, erosive esophagitis, gastritis  . ESOPHAGOGASTRODUODENOSCOPY N/A 06/07/2014   Procedure: ESOPHAGOGASTRODUODENOSCOPY (EGD);  Surgeon: Rogene Houston, MD;  [facility];  Service: Endoscopy;  Laterality: N/A;  1200  . ESOPHAGOGASTRODUODENOSCOPY N/A 08/18/2017   Procedure: ESOPHAGOGASTRODUODENOSCOPY (EGD);  Surgeon: Rogene Houston, MD;  [facility];  Service: Endoscopy;  Laterality: N/A;  2:20  . FOOT SURGERY    . INNER EAR SURGERY     Left  . KNEE SURGERY     Multiple  . SHOULDER SURGERY  2011  . TONSILLECTOMY    . TOTAL KNEE ARTHROPLASTY Left 02/07/2018   Procedure: TOTAL KNEE ARTHROPLASTY;  Surgeon: Gaynelle Arabian, MD;  [facility];  Service: Orthopedics;  Laterality: Left;  7min  . TUBAL LIGATION    . TYMPANOSTOMY TUBE PLACEMENT      There were no vitals filed for  this visit.  Subjective Assessment - 03/04/18 1435    Subjective  Pt states that her knee actually wasn't quite as sore from the bike incident as she thought. She has only taken Tylenol today. her knee pain is 7/10 currently.     Patient Stated Goals  be able to function and stand and walk, potentially squat    Currently in Pain?  Yes    Pain Score  7     Pain Location  Knee    Pain Orientation  Left    Pain Descriptors / Indicators  Aching    Pain Type  Surgical pain    Pain Onset  1 to 4 weeks ago    Pain Frequency  Constant    Aggravating Factors   WB, standing, walking, bending    Pain Relieving Factors  pain medication, ice    Effect of Pain on Daily Activities  increases            [facility] Adult PT Treatment/Exercise - 03/04/18 0001      Knee/Hip Exercises: Seated   Long Arc Quad  Left;10 reps    Long Arc Quad Limitations  3-5" holds    Heel Slides  Left;10 reps    Heel Slides Limitations  3-5" holds      Knee/Hip Exercises: Supine   Quad Sets  20 reps    Quad Sets Limitations  cues for quad contraction; ball under knee for TKE for last 10 reps    Short Arc [employer]  Left;15 reps    Short Arc Quad Sets Limitations  2-3" holds    Heel Slides  Left;10 reps    Heel Slides Limitations  manual assist from PT; x10" hold at end range on last rep    Knee Extension Limitations  7    Knee Flexion Limitations  61      Modalities   Modalities  Moist Heat      Moist Heat Therapy   Number Minutes Moist Heat  18 Minutes    Moist Heat Location  Hip   during last bit of therex &manual therapy to reduce hip pain     Manual Therapy   Manual Therapy  Edema management    Manual therapy comments  Manual complete separate from rest of tx    Edema Management  Retrograde massage with LE elevated           PT Education - 03/04/18 1435    Education Details  continue HEP    Person(s) Educated  Patient;Spouse    Methods  Explanation    Comprehension  Verbalized understanding       PT Short Term Goals - 02/22/18 1256      PT SHORT TERM GOAL #1   Title  Pt will be independent with and consistently perform HEP to improve ROM and reduce pain.     Time  3    Period  Weeks    Status  New    Target Date  03/15/18      PT SHORT TERM GOAL #2   Title  Pt will have reduced edema by 2cm or > at joint line in order to maximize ROM and reduce pain.     Time  3    Period  Weeks    Status  New      PT SHORT TERM GOAL #3   Title  Pt will have improved L knee AROM from 5 to 75 deg in order to reduce pain  and maximize gait.     Time  3    Period  Weeks    Status  New        PT Long Term Goals - 02/22/18 1257      PT LONG TERM GOAL #1   Title  Pt will have  improved L knee AROM from 0 to 110deg in order to further reduce pain and maximize stair ambulation.     Time  6    Period  Weeks    Status  New    Target Date  04/05/18      PT LONG TERM GOAL #2   Title  Pt will have improved MMT to 4/5 or > throughout mm groups tested in order to maximize gait and functional mobility.     Time  6    Period  Weeks    Status  New      PT LONG TERM GOAL #3   Title  Pt will be able to perform 12STS during 30sec chair rise test without UE support and proper form in order to demo improved balance and functional strength.     Time  6    Period  Weeks    Status  New      PT LONG TERM GOAL #4   Title  Pt will be able to perform L SLS for 10sec or > to demo improved balance, core and functional strength in order to maximize gait, stairs, and other functional mobility.     Time  6    Period  Weeks    Status  New      PT LONG TERM GOAL #5   Title  Pt will be able to ambulate at least 438ft during 3MWT with LRAD, 2/10 L knee pain, and gait WFL in order to demo improved tolerance to functional mobility and maximize her HH ambulation.    Time  6    Period  Weeks    Status  New            Plan - 03/04/18 1521    Clinical Impression Statement  Pt presents to therapy reporting only mild increase in soreness in her L knee following full revolution on bike last session. Today's session focused on ROM, edema, and pain. Pt continues to be limited with L nee pain and L hip pain. Applied moist heat pack to L hip for last bit of supine ROM and during edema management. AROM 7-61deg today with pt in semi-reclined position. Pt reporting reduced L knee pain to 6/10 at EOS and reduced hip pain as well. Encouraged pt to apply heat to L hip during HEP in order for her to be able to tolerate her exercises better and she and her husband verbalized understanding.     Rehab Potential  Fair    PT Frequency  3x / week    PT Duration  6 weeks    PT Treatment/Interventions   ADLs/Self Care Home Management;Aquatic Therapy;Cryotherapy;Electrical Stimulation;Moist Heat;Gait training;DME Instruction;Stair training;Functional mobility training;Therapeutic activities;Therapeutic exercise;Balance training;Neuromuscular re-education;Patient/family education;Manual techniques;Compression bandaging;Scar mobilization;Orthotic Fit/Training;Passive range of motion;Dry needling;Energy conservation;Taping    PT Next Visit Plan  continue heavy focus on AROM, particularly flexion, and edema initially; heat to L hip during supine exercises     PT Home Exercise Plan  eval: quad set, supine heel slide, seated HS stretch, seated knee flexion, hooklying SKTC (husband to assist)    Consulted and Agree with Plan of Care  Patient;Family member/caregiver  Family Member Consulted  husband       Patient will benefit from skilled therapeutic intervention in order to improve the following deficits and impairments:  Abnormal gait, Decreased activity tolerance, Decreased balance, Decreased endurance, Decreased mobility, Decreased range of motion, Decreased scar mobility, Decreased strength, Difficulty walking, Hypomobility, Increased edema, Increased fascial restricitons, Increased muscle spasms, Impaired flexibility, Improper body mechanics, Postural dysfunction, Obesity, Pain  Visit Diagnosis: Acute pain of left knee  Stiffness of left knee, not elsewhere classified  Localized edema  Muscle weakness (generalized)     Problem List Patient Active Problem List   Diagnosis Date Noted  . OA (osteoarthritis) of knee 02/07/2018  . Other allergic contact dermatitis 12/20/2017  . Non-allergic rhinitis 12/16/2017  . Allergy to metal 12/16/2017  . Gastroesophageal reflux disease without esophagitis 07/15/2017  . Primary osteoarthritis of both feet 04/29/2017  . Family history of early CAD 04/15/2016  . Primary osteoarthritis of both knees 12/31/2015  . Chest pain 07/13/2015  . Atypical chest  pain 07/13/2015  . Major depression 03/19/2015  . Vulvodynia 08/03/2014  . Functional constipation 08/03/2014  . Anxiety 07/06/2014  . Fibromyalgia 04/25/2014  . Snoring 04/22/2014  . Prediabetes 03/18/2014  . Hyperlipidemia 03/18/2014  . Osteopenia 03/06/2014  . History of cardiac catheterization 04/28/2013  . Dyspepsia 02/02/2012  . GERD (gastroesophageal reflux disease) 02/02/2012  . Depression 06/18/2011  . Dysphagia 02/05/2011  . CHEST PAIN 02/11/2009  . GASTROESOPHAGEAL REFLUX DISEASE, CHRONIC 02/15/2007  . IRRITABLE BOWEL SYNDROME 02/15/2007  . Primary osteoarthritis of both hands 02/15/2007  . Reflux esophagitis 02/14/2007  . GASTRITIS 02/14/2007       Geraldine Solar PT, DPT   [facility] [address] [place], [place], [address] Phone: [phone]   Fax:  [phone]  Name: Ruth [name] MRN: [id number] Date of Birth: 1940/12/10

## 2018-03-04 NOTE — Telephone Encounter (Signed)
Nurses I reviewed over the drug registry Currently patient taking oxycodone I imagine the patient is taking pain medicine on a daily basis It is not recommended to be on nerve medicine when on pain medicine Please find out how she is taking her oxycodone I am sympathetic about her anxiousness but it is not good medicine to prescribe nerve pills with pain pills Nerve pills are not recommended with pain medicine

## 2018-03-04 NOTE — Telephone Encounter (Signed)
Pt is requesting refill on diazepam (VALIUM) 5 MG tablet. She has been having anxiety since the day of her knee replacement.   Please send to [facility], [place] [facility] [place].  Last OV 01/19/18 for surgical clearance

## 2018-03-04 NOTE — Telephone Encounter (Signed)
I sent it in 

## 2018-03-04 NOTE — Telephone Encounter (Signed)
Pt states she is not taking Oxycodone due to it making her crazy mentally. Pt states her last Oxy was Jan 15 at 7:30 am and her last Valium was 03/03/2018 at 8:54pm. Pt states she had some Valium left over from another script. Pt states since her surgery she has been having anxiousness, and horrible thoughts. Pt states she is trying to use essential oil, deep breathing and natural remedies to cope. Pt states she would come in and talk to [place] but she is scared she may get the flu. Pt takes she took 2 tylenol before PT today and that got her through. Please advise. Thank you

## 2018-03-07 ENCOUNTER — Other Ambulatory Visit: Payer: Self-pay | Admitting: Family Medicine

## 2018-03-07 ENCOUNTER — Encounter: Payer: Self-pay | Admitting: Family Medicine

## 2018-03-07 ENCOUNTER — Ambulatory Visit (INDEPENDENT_AMBULATORY_CARE_PROVIDER_SITE_OTHER): Payer: PPO | Admitting: Family Medicine

## 2018-03-07 ENCOUNTER — Telehealth: Payer: Self-pay | Admitting: Family Medicine

## 2018-03-07 VITALS — Wt 216.2 lb

## 2018-03-07 DIAGNOSIS — F320 Major depressive disorder, single episode, mild: Secondary | ICD-10-CM | POA: Insufficient documentation

## 2018-03-07 DIAGNOSIS — F411 Generalized anxiety disorder: Secondary | ICD-10-CM

## 2018-03-07 MED ORDER — CLONAZEPAM 0.5 MG PO TABS: ORAL_TABLET | ORAL | 1 refills | Status: DC

## 2018-03-07 MED ORDER — ESCITALOPRAM OXALATE 10 MG PO TABS: 10.0000 mg | ORAL_TABLET | Freq: Every day | ORAL | 3 refills | Status: DC

## 2018-03-07 NOTE — Telephone Encounter (Signed)
Discussed with pt that dr Nicki Reaper recommnends klonopin 0.5 mg tablet This should be taken 1 every 6 hours as needed for anxiousness he would expect her to take anywhere between 2 tablets to 4 tablets a day He will send in the prescription to the pharmacy She should not take Valium with this-that is the diazepam  Follow-up will be as planned in a few weeks sooner if she needs [place]. Pt verbalized understanding of all. And dr Nicki Reaper has already sent rx to pharm.

## 2018-03-07 NOTE — Patient Instructions (Addendum)
escitalopram 10  Use 1/2 daily for the first week Then 1 daily Follow up here in 3 to 4 weeks  Valium 5mg  1/2 to 1 q 6 hours as needed  Please try to stick with 1/2 as much as possible of the valium  Follow up here in 3 to 4 weeks

## 2018-03-07 NOTE — Telephone Encounter (Signed)
So at this point I would recommend trying Klonopin  I would recommend 0.5 mg tablet This should be taken 1 every 6 hours as needed for anxiousness I would expect her to take anywhere between 2 tablets to 4 tablets a day I will send in the prescription to the pharmacy She should not take Valium with this-that is the diazepam  Follow-up will be as planned in a few weeks sooner if she needs [place]

## 2018-03-07 NOTE — Telephone Encounter (Signed)
Pt calling to report when she got home today she took 1/2 a valium and was able to relax and go to sleep for about an hour. When she woke up her anxiety was back. She is wanting to know if Dr. Nicki Reaper would want to call in the other medication they discussed today or if he would like her to continue this to see if this helps.   If something else is called in please send to [facility] [place], [place]

## 2018-03-07 NOTE — Progress Notes (Signed)
   Subjective:    [name], female    DOB: [birth year], 78 y.o.   MRN: [id number]  Anxiety  Presents for follow-up visit. Symptoms include nervous/anxious behavior. Primary symptoms comment: raw fear of anxiety, doom after knee surgery. pt was given oxycodone and did have spinal tap. . The quality of sleep is poor.    pt had knee surgery on 02/07/18. Pt states she has gotten better. Pt has not been sleeping well. Pt states she has went cold [place] off anti depressants before and went through those withdrawals. Pt states she has never felt this before. Pt states she did have chill and sweats.  Pt was on muscle relaxer but was taken off and placed on Valium. Pt is afraid that whatever she takes will make her anxiety flare up. Last med was 500 mg tylenol at midnight plus tylenol throughout the day. Tylenol on 18th 625 pm 1053 pm ;Jan17th tylenol x3; Jan 16th valium 5mg x2 10 mg prozac Last oxy 15 at 7:30 am   Patient relates feeling down and sad blue negative lack of enjoyment finds her self overwhelmed Review of Systems  Psychiatric/Behavioral: The patient is nervous/anxious.        Objective:   Physical Exam  Lungs clear respiratory rate normal heart regular no murmurs  15 minutes was spent with patient today discussing healthcare issues which they came.  More than 50% of this visit-total duration of visit-was spent in counseling and coordination of care.  Please see diagnosis regarding the focus of this coordination and care  With the symptomatology the patient has had with anxiousness feeling depressed down sad lacking enjoyment symptoms present ever since her knee surgery several weeks ago this qualifies as major depression mild to moderate     Assessment & Plan:  Severe anxiety and depression not suicidal 20 minutes spent discussing various options with the patient She does not want to have any counseling Does not want referral We have agreed upon starting Lexapro  10 mg daily half tablet daily for the first week Then 1 tablet thereafter Follow-up if progressive troubles Otherwise recheck in 3 to 4 weeks Valium on a as needed basis but not for frequent use

## 2018-03-09 ENCOUNTER — Encounter (HOSPITAL_COMMUNITY): Payer: Self-pay

## 2018-03-09 ENCOUNTER — Ambulatory Visit (HOSPITAL_COMMUNITY): Payer: PPO

## 2018-03-09 DIAGNOSIS — M25562 Pain in left knee: Secondary | ICD-10-CM | POA: Diagnosis not present

## 2018-03-09 DIAGNOSIS — M25662 Stiffness of left knee, not elsewhere classified: Secondary | ICD-10-CM

## 2018-03-09 DIAGNOSIS — R600 Localized edema: Secondary | ICD-10-CM

## 2018-03-09 DIAGNOSIS — M6281 Muscle weakness (generalized): Secondary | ICD-10-CM

## 2018-03-09 NOTE — Therapy (Signed)
[facility] [place], [place], [address] Phone: [phone]   Fax:  [phone]  Physical Therapy Treatment  Patient Details  Name: Ruth [name] MRN: [id number] Date of Birth: 05-25-1940 Referring Provider (PT): Theresa Duty, PA-C (Surgeon: Gaynelle Arabian, MD)   Encounter Date: 03/09/2018  PT End of Session - 03/09/18 1435    Visit Number  7    Number of Visits  19    Date for PT Re-Evaluation  04/05/18   Minireassess 03/15/18   Authorization Type  [employer]    Authorization Time Period  02/22/18 to 04/05/18    Authorization - Visit Number  7    Authorization - Number of Visits  10    PT Start Time  1033    PT Stop Time  1113    PT Time Calculation (min)  40 min    Activity Tolerance  Patient tolerated treatment well    Behavior During Therapy  [facility] for tasks assessed/performed       Past Medical History:  Diagnosis Date  . Anxiety   . Arthritis   . Asthma   . Depression   . Eczema   . Fibromyalgia   . GERD (gastroesophageal reflux disease)    EGD/ colon 1/09  . H pylori ulcer 1980-1990   s/p treatment  . Headache   . Hyperlipidemia   . IBS (irritable bowel syndrome)   . Laryngospasm   . Neck pain   . OSA (obstructive sleep apnea)   . PONV (postoperative nausea and vomiting)   . Refusal of blood transfusions as patient is Jehovah's Witness   . Vertigo     Past Surgical History:  Procedure Laterality Date  . ABDOMINAL HYSTERECTOMY  1970  . APPENDECTOMY    . BILATERAL SALPINGOOPHORECTOMY  1990s  . CARDIAC CATHETERIZATION  2002   [facility]) normal coronary arteries   . COLONOSCOPY  01/2007   Dr. Meriel Flavors  . COLONOSCOPY N/A 04/18/2015   Procedure: COLONOSCOPY;  Surgeon: Rogene Houston, MD;  [facility]: [facility];  Service: Endoscopy;  Laterality: N/A;  830 - moved to 8:55 - Ann to notify pt  . COLONOSCOPY WITH ESOPHAGOGASTRODUODENOSCOPY (EGD)  02/16/2012   Procedure: COLONOSCOPY WITH  ESOPHAGOGASTRODUODENOSCOPY (EGD);  Surgeon: Daneil Dolin, MD;  Location: [facility];  Service: Endoscopy;  Laterality: N/A;  8:45  . ESOPHAGEAL DILATION N/A 06/07/2014   Procedure: ESOPHAGEAL DILATION;  Surgeon: Rogene Houston, MD;  Location: [facility];  Service: Endoscopy;  Laterality: N/A;  . ESOPHAGOGASTRODUODENOSCOPY  01/2007   Dr Sharlett Iles- 3 cm hiatal hernia, benign esophageal biopsies, erosive esophagitis, gastritis  . ESOPHAGOGASTRODUODENOSCOPY N/A 06/07/2014   Procedure: ESOPHAGOGASTRODUODENOSCOPY (EGD);  Surgeon: Rogene Houston, MD;  [facility];  Service: Endoscopy;  Laterality: N/A;  1200  . ESOPHAGOGASTRODUODENOSCOPY N/A 08/18/2017   Procedure: ESOPHAGOGASTRODUODENOSCOPY (EGD);  Surgeon: Rogene Houston, MD;  [facility]: [facility];  Service: Endoscopy;  Laterality: N/A;  2:20  . FOOT SURGERY    . INNER EAR SURGERY     Left  . KNEE SURGERY     Multiple  . SHOULDER SURGERY  2011  . TONSILLECTOMY    . TOTAL KNEE ARTHROPLASTY Left 02/07/2018   Procedure: TOTAL KNEE ARTHROPLASTY;  Surgeon: Gaynelle Arabian, MD;  Location: [facility];  Service: Orthopedics;  Laterality: Left;  30min  . TUBAL LIGATION    . TYMPANOSTOMY TUBE PLACEMENT      There were no vitals filed for  this visit.  Subjective Assessment - 03/09/18 1039    Subjective  Patient states knee has really been hurting since Sunday. Took a Tylenol before coming to therapy.  3-4/10 pain today behind knee and below knee cap.    Patient Stated Goals  be able to function and stand and walk, potentially squat    Currently in Pain?  Yes    Pain Location  Knee    Pain Orientation  Left    Pain Descriptors / Indicators  Sharp    Pain Type  Surgical pain    Pain Onset  1 to 4 weeks ago    Pain Frequency  Constant                       [facility] Adult PT Treatment/Exercise - 03/09/18 0001      Ambulation/Gait   Ambulation/Gait  Yes    Ambulation/Gait Assistance  5: Supervision   verbal cues for  knee flexion in swing phase of gait Lt LE.   Ambulation Distance (Feet)  200 Feet    Assistive device  Rolling walker    Gait Pattern  Step-through pattern;Decreased hip/knee flexion - left    Ambulation Surface  Level;Indoor      Knee/Hip Exercises: Seated   Long Arc Quad  Left;10 reps    Long Arc Quad Limitations  3-5" holds    Heel Slides  Left;10 reps    Heel Slides Limitations  3-5" holds      Knee/Hip Exercises: Supine   Quad Sets  10 reps    Quad Sets Limitations  cues for quad activation; PT's hand under knee    Short Arc Quad Sets  Left;5 reps    Heel Slides  Left;10 reps    Heel Slides Limitations  manual assist from PT; x10" hold at end range all reps    Knee Extension  AROM    Knee Extension Limitations  7    Knee Flexion Limitations  61      Modalities   Modalities  Moist Heat      Moist Heat Therapy   Number Minutes Moist Heat  15 Minutes    Moist Heat Location  Hip   during ther ex and manual     Manual Therapy   Manual Therapy  Edema management;Joint mobilization    Manual therapy comments  Manual complete separate from rest of tx    Edema Management  Retrograde massage with LE elevated    Joint Mobilization  Patella mobs all direction for ROM             PT Education - 03/09/18 1435    Education Details  Discusssed purpose and technique of interventions throughout session.    Person(s) Educated  Patient;Spouse    Methods  Explanation;Demonstration;Handout    Comprehension  Verbalized understanding;Need further instruction       PT Short Term Goals - 03/09/18 1442      PT SHORT TERM GOAL #1   Title  Pt will be independent with and consistently perform HEP to improve ROM and reduce pain.     Time  3    Period  Weeks    Status  On-going      PT SHORT TERM GOAL #2   Title  Pt will have reduced edema by 2cm or > at joint line in order to maximize ROM and reduce pain.     Time  3    Period  Weeks  Status  On-going      PT SHORT TERM GOAL  #3   Title  Pt will have improved L knee AROM from 5 to 75 deg in order to reduce pain and maximize gait.     Time  3    Period  Weeks    Status  On-going        PT Long Term Goals - 03/09/18 1443      PT LONG TERM GOAL #1   Title  Pt will have improved L knee AROM from 0 to 110deg in order to further reduce pain and maximize stair ambulation.     Time  6    Period  Weeks    Status  On-going      PT LONG TERM GOAL #2   Title  Pt will have improved MMT to 4/5 or > throughout mm groups tested in order to maximize gait and functional mobility.     Time  6    Period  Weeks    Status  On-going      PT LONG TERM GOAL #3   Title  Pt will be able to perform 12STS during 30sec chair rise test without UE support and proper form in order to demo improved balance and functional strength.     Time  6    Period  Weeks    Status  On-going      PT LONG TERM GOAL #4   Title  Pt will be able to perform L SLS for 10sec or > to demo improved balance, core and functional strength in order to maximize gait, stairs, and other functional mobility.     Time  6    Period  Weeks    Status  On-going      PT LONG TERM GOAL #5   Title  Pt will be able to ambulate at least 447ft during 3MWT with LRAD, 2/10 L knee pain, and gait WFL in order to demo improved tolerance to functional mobility and maximize her HH ambulation.    Time  6    Period  Weeks    Status  On-going            Plan - 03/09/18 1442    Clinical Impression Statement  Continued with established plan of care. Continued with therapeutic excercises from previou session, largely NWB. Did not perform bike today. Manual therapy performed for patellar mobs in all directions and edema management. In supine patient position in semi-reclined with wedge and 3 head pillows. Patient AROm conitnues at 7-61 degrees post exercises and manual therapy. Continued with moinst heat pack to left hip during ther ex and manual therapy to increase patient's  tolerance. PT provided pictures for HEP for LAQ and heel slides in sitting; in supine, heel slides, SAQ, QS.  Continue with current plan, progress as able.     Rehab Potential  Fair    PT Frequency  3x / week    PT Duration  6 weeks    PT Treatment/Interventions  ADLs/Self Care Home Management;Aquatic Therapy;Cryotherapy;Electrical Stimulation;Moist Heat;Gait training;DME Instruction;Stair training;Functional mobility training;Therapeutic activities;Therapeutic exercise;Balance training;Neuromuscular re-education;Patient/family education;Manual techniques;Compression bandaging;Scar mobilization;Orthotic Fit/Training;Passive range of motion;Dry needling;Energy conservation;Taping    PT Next Visit Plan  continue heavy focus on AROM, particularly flexion, and edema initially; heat to L hip during supine exercises     PT Home Exercise Plan  eval: quad set, supine heel slide, seated HS stretch, seated knee flexion, hooklying SKTC (husband to assist)  Consulted and Agree with Plan of Care  Patient;Family member/caregiver    Family Member Consulted  husband       Patient will benefit from skilled therapeutic intervention in order to improve the following deficits and impairments:  Abnormal gait, Decreased activity tolerance, Decreased balance, Decreased endurance, Decreased mobility, Decreased range of motion, Decreased scar mobility, Decreased strength, Difficulty walking, Hypomobility, Increased edema, Increased fascial restricitons, Increased muscle spasms, Impaired flexibility, Improper body mechanics, Postural dysfunction, Obesity, Pain  Visit Diagnosis: Acute pain of left knee  Stiffness of left knee, not elsewhere classified  Localized edema  Muscle weakness (generalized)     Problem List Patient Active Problem List   Diagnosis Date Noted  . Depression, major, single episode, mild ([place]) 03/07/2018  . OA (osteoarthritis) of knee 02/07/2018  . Other allergic contact dermatitis  12/20/2017  . Non-allergic rhinitis 12/16/2017  . Allergy to metal 12/16/2017  . Gastroesophageal reflux disease without esophagitis 07/15/2017  . Primary osteoarthritis of both feet 04/29/2017  . Family history of early CAD 04/15/2016  . Primary osteoarthritis of both knees 12/31/2015  . Chest pain 07/13/2015  . Atypical chest pain 07/13/2015  . Major depression 03/19/2015  . Vulvodynia 08/03/2014  . Functional constipation 08/03/2014  . Anxiety 07/06/2014  . Fibromyalgia 04/25/2014  . Snoring 04/22/2014  . Prediabetes 03/18/2014  . Hyperlipidemia 03/18/2014  . Osteopenia 03/06/2014  . History of cardiac catheterization 04/28/2013  . Dyspepsia 02/02/2012  . GERD (gastroesophageal reflux disease) 02/02/2012  . Depression 06/18/2011  . Dysphagia 02/05/2011  . CHEST PAIN 02/11/2009  . GASTROESOPHAGEAL REFLUX DISEASE, CHRONIC 02/15/2007  . IRRITABLE BOWEL SYNDROME 02/15/2007  . Primary osteoarthritis of both hands 02/15/2007  . Reflux esophagitis 02/14/2007  . GASTRITIS 02/14/2007    Floria Raveling. Hartnett-Rands, MS, PT [facility] #[id number] 03/09/2018, 2:43 PM  [facility] [address] [place], [place], [address] Phone: [phone]   Fax:  [phone]  Name: Ruth [name] MRN: [id number] Date of Birth: 08/02/1940

## 2018-03-09 NOTE — Patient Instructions (Signed)
KNEE: Extension, Long Arc Quad (Weight)    Place weight around leg. Raise leg until knee is straight. Hold 3-5___ seconds. No weight. _10-15__ reps per set, 1-2___ sets per day.  Copyright  VHI. All rights reserved.   Short Arc [employer] a large can or rolled towel under left leg. Straighten leg. Hold _3-5___ seconds. Repeat _10-15___ times. Do _1-2___ sessions per day.  http://gt2.exer.us/299   Copyright  VHI. All rights reserved.    Quad Set: Slight Flexion    Tense muscles on top of left thigh pushing back of knee down into towel roll. Hold _3-5___ seconds. Repeat 10-15____ times per set. Do _1___ sets per session. Do _1-2___ sessions per day.  http://orth.exer.us/679   Copyright  VHI. All rights reserved.    HIP / KNEE: Flexion, Heel Slides - Supine    Slide heel up toward buttocks, keeping leg in straight line. _10-15__ reps per set, 1-2___ sets per day. Use towel or pillowcase under heel as needed.  Copyright  VHI. All rights reserved.

## 2018-03-11 ENCOUNTER — Ambulatory Visit (HOSPITAL_COMMUNITY): Payer: PPO

## 2018-03-14 ENCOUNTER — Ambulatory Visit (HOSPITAL_COMMUNITY): Payer: PPO

## 2018-03-14 ENCOUNTER — Encounter (HOSPITAL_COMMUNITY): Payer: Self-pay

## 2018-03-14 DIAGNOSIS — M25562 Pain in left knee: Secondary | ICD-10-CM | POA: Diagnosis not present

## 2018-03-14 DIAGNOSIS — R600 Localized edema: Secondary | ICD-10-CM

## 2018-03-14 DIAGNOSIS — M25662 Stiffness of left knee, not elsewhere classified: Secondary | ICD-10-CM

## 2018-03-14 DIAGNOSIS — M6281 Muscle weakness (generalized): Secondary | ICD-10-CM

## 2018-03-14 NOTE — Therapy (Signed)
[name], [place], [address] Phone: [phone]   Fax:  [phone]   Progress Note Reporting Period 02/22/18 to 03/14/18  See note below for Objective Data and Assessment of Progress/Goals.   Physical Therapy Treatment  Patient Details  Name: Ruth [name] MRN: [id number] Date of Birth: 1940/07/04 Referring Provider (PT): Theresa Duty, PA-C Gaynelle Arabian, MD)   Encounter Date: 03/14/2018  PT End of Session - 03/14/18 1525    Visit Number  8    Number of Visits  19    Date for PT Re-Evaluation  04/05/18   Minireassess completed 03/14/18   Authorization Type  [employer]    Authorization Time Period  02/22/18 to 04/05/18    Authorization - Visit Number  8    Authorization - Number of Visits  19    PT Start Time  0630    PT Stop Time  1600    PT Time Calculation (min)  41 min    Activity Tolerance  Patient tolerated treatment well    Behavior During Therapy  [facility] for tasks assessed/performed       Past Medical History:  Diagnosis Date  . Anxiety   . Arthritis   . Asthma   . Depression   . Eczema   . Fibromyalgia   . GERD (gastroesophageal reflux disease)    EGD/ colon 1/09  . H pylori ulcer 1980-1990   s/p treatment  . Headache   . Hyperlipidemia   . IBS (irritable bowel syndrome)   . Laryngospasm   . Neck pain   . OSA (obstructive sleep apnea)   . PONV (postoperative nausea and vomiting)   . Refusal of blood transfusions as patient is Jehovah's Witness   . Vertigo     Past Surgical History:  Procedure Laterality Date  . ABDOMINAL HYSTERECTOMY  1970  . APPENDECTOMY    . BILATERAL SALPINGOOPHORECTOMY  1990s  . CARDIAC CATHETERIZATION  2002   [facility]) normal coronary arteries   . COLONOSCOPY  01/2007   Dr. Meriel Flavors  . COLONOSCOPY N/A 04/18/2015   Procedure: COLONOSCOPY;  Surgeon: Rogene Houston, MD;  Location: [facility];  Service: Endoscopy;  Laterality: N/A;  830 -  moved to 8:55 - Ann to notify pt  . COLONOSCOPY WITH ESOPHAGOGASTRODUODENOSCOPY (EGD)  02/16/2012   Procedure: COLONOSCOPY WITH ESOPHAGOGASTRODUODENOSCOPY (EGD);  Surgeon: Daneil Dolin, MD;  Location: [facility];  Service: Endoscopy;  Laterality: N/A;  8:45  . ESOPHAGEAL DILATION N/A 06/07/2014   Procedure: ESOPHAGEAL DILATION;  Surgeon: Rogene Houston, MD;  Location: [facility];  Service: Endoscopy;  Laterality: N/A;  . ESOPHAGOGASTRODUODENOSCOPY  01/2007   Dr Sharlett Iles- 3 cm hiatal hernia, benign esophageal biopsies, erosive esophagitis, gastritis  . ESOPHAGOGASTRODUODENOSCOPY N/A 06/07/2014   Procedure: ESOPHAGOGASTRODUODENOSCOPY (EGD);  Surgeon: Rogene Houston, MD;  [facility]: [facility];  Service: Endoscopy;  Laterality: N/A;  1200  . ESOPHAGOGASTRODUODENOSCOPY N/A 08/18/2017   Procedure: ESOPHAGOGASTRODUODENOSCOPY (EGD);  Surgeon: Rogene Houston, MD;  Location: [facility];  Service: Endoscopy;  Laterality: N/A;  2:20  . FOOT SURGERY    . INNER EAR SURGERY     Left  . KNEE SURGERY     Multiple  . SHOULDER SURGERY  2011  . TONSILLECTOMY    . TOTAL KNEE ARTHROPLASTY Left 02/07/2018   Procedure: TOTAL KNEE ARTHROPLASTY;  Surgeon: Gaynelle Arabian, MD;  Location: [facility];  Service: Orthopedics;  Laterality: Left;  85mn  .  TUBAL LIGATION    . TYMPANOSTOMY TUBE PLACEMENT      There were no vitals filed for this visit.  Subjective Assessment - 03/14/18 1524    Subjective  Pt states that her knee isn't too bad today pain-wise. Reports it a 4/10 currently.     Patient Stated Goals  be able to function and stand and walk, potentially squat    Currently in Pain?  Yes    Pain Score  4     Pain Location  Knee    Pain Orientation  Left    Pain Descriptors / Indicators  Aching    Pain Type  Surgical pain    Pain Onset  1 to 4 weeks ago    Pain Frequency  Constant    Aggravating Factors   WB, standing, walking, bending    Pain Relieving Factors  pain medication, ice    Effect  of Pain on Daily Activities  increases         [facility] PT Assessment - 03/14/18 0001      Assessment   Medical Diagnosis  L TKA    Referring Provider (PT)  Theresa Duty, PA-C   Gaynelle Arabian, MD   Onset Date/Surgical Date  02/07/18    Next MD Visit  03/15/18    Prior Therapy  HHPT d/c on 02/18/18      Circumferential Edema   Circumferential - Left   49cm, joint line   was 50cm, joint line     Functional Tests   Functional tests  Sit to Stand      Sit to Stand   Comments  30sec chair rise test: 8x, no UE support   was 8x with BUE support     AROM   Left Knee Extension  5   was 13   Left Knee Flexion  61   was 38     Strength   Right Hip Extension  4-/5   functional assessment; was 3+   Right Hip ABduction  4/5   was 4-   Left Hip Flexion  4+/5   was 4   Left Hip Extension  3+/5   was 3+   Left Hip ABduction  4/5   was 3+   Left Knee Flexion  3-/5    Left Knee Extension  4+/5   was 4-   Left Ankle Dorsiflexion  5/5   was 4     Ambulation/Gait   Ambulation Distance (Feet)  560 Feet   3MWT; was 231f   Assistive device  4-wheeled walker    Gait Pattern  Step-through pattern;Decreased stance time - left;Decreased hip/knee flexion - left;Decreased dorsiflexion - left;Trendelenburg;Antalgic   R anterior pelvic rotation, L posterior pelvic rotation     Balance   Balance Assessed  Yes      Static Standing Balance   Static Standing - Balance Support  No upper extremity supported    Static Standing Balance -  Activities   Single Leg Stance - Left Leg    Static Standing - Comment/# of Minutes  L: 56sec, no UE   was 2 sec            [facility] Adult PT Treatment/Exercise - 03/14/18 0001      Knee/Hip Exercises: Stretches   Knee: Self-Stretch to increase Flexion  Left    Knee: Self-Stretch Limitations  10x10" holds 6" step    Gastroc Stretch  Both;3 reps;30 seconds    Gastroc Stretch Limitations  slant board  Knee/Hip Exercises: Standing   Stairs  x1RT,  step-to pattern             PT Short Term Goals - 03/14/18 1525      PT SHORT TERM GOAL #1   Title  Pt will be independent with and consistently perform HEP to improve ROM and reduce pain.     Time  3    Period  Weeks    Status  Achieved      PT SHORT TERM GOAL #2   Title  Pt will have reduced edema by 2cm or > at joint line in order to maximize ROM and reduce pain.     Baseline  1/27: decreased by 1cm    Time  3    Period  Weeks    Status  Partially Met      PT SHORT TERM GOAL #3   Title  Pt will have improved L knee AROM from 5 to 75 deg in order to reduce pain and maximize gait.     Baseline  1/27: 5-61deg    Time  3    Period  Weeks    Status  Partially Met        PT Long Term Goals - 03/14/18 1525      PT LONG TERM GOAL #1   Title  Pt will have improved L knee AROM from 0 to 110deg in order to further reduce pain and maximize stair ambulation.     Baseline  1/27: 5-61deg    Time  6    Period  Weeks    Status  On-going      PT LONG TERM GOAL #2   Title  Pt will have improved MMT to 4/5 or > throughout mm groups tested in order to maximize gait and functional mobility.     Baseline  1/27: see MMT    Time  6    Period  Weeks    Status  Partially Met      PT LONG TERM GOAL #3   Title  Pt will be able to perform 12STS during 30sec chair rise test without UE support and proper form in order to demo improved balance and functional strength.     Baseline  1/27: 8x, no UE support    Time  6    Period  Weeks    Status  On-going      PT LONG TERM GOAL #4   Title  Pt will be able to perform L SLS for 10sec or > to demo improved balance, core and functional strength in order to maximize gait, stairs, and other functional mobility.     Baseline  1/27: 56sec LLE    Time  6    Period  Weeks    Status  Achieved      PT LONG TERM GOAL #5   Title  Pt will be able to ambulate at least 473f during 3MWT with LRAD, 2/10 L knee pain, and gait WFL in order to demo  improved tolerance to functional mobility and maximize her HH ambulation.    Baseline  1/27: 5629f but still using rollator and gait deviations    Time  6    Period  Weeks    Status  Partially Met            Plan - 03/14/18 1616    Clinical Impression Statement  PT reassessed pt's goals and outcome measures. Pt has made good progress towards goals as  illustrated above. Her edema has reduced by 1cm, her balance has drastically improved, and her functional strength has improved AEB ability to perform 8STS during 30sec chair rise test without the use of her UEs. Her AROM has improved to 5-61deg, but this is still significantly limited and mostly due to pain. Pt sees Dr. Wynelle Link tomorrow for her 5-week f/u appointment; educated pt that if they proceed with a MUA, then we would have to re-evaluate her following that and would get her into therapy for 5days straight following in order to obtain as much ROM as possible and she verbalized understanding. Ended with standing stretching and stair training as she and her husband inquired about her going up/down them. Educated her on step-to and to ascend with R, descend with L; pt without difficulty using this technique and felt comfortable doing so. Pending MD f/u tomorrow, continue as planned, progressing as able.    Rehab Potential  Fair    PT Frequency  3x / week    PT Duration  6 weeks    PT Treatment/Interventions  ADLs/Self Care Home Management;Aquatic Therapy;Cryotherapy;Electrical Stimulation;Moist Heat;Gait training;DME Instruction;Stair training;Functional mobility training;Therapeutic activities;Therapeutic exercise;Balance training;Neuromuscular re-education;Patient/family education;Manual techniques;Compression bandaging;Scar mobilization;Orthotic Fit/Training;Passive range of motion;Dry needling;Energy conservation;Taping    PT Next Visit Plan  continue heavy focus on AROM, particularly flexion, and edema initially; heat to L hip during supine  exercises     PT Home Exercise Plan  eval: quad set, supine heel slide, seated HS stretch, seated knee flexion, hooklying SKTC (husband to assist)    Consulted and Agree with Plan of Care  Patient;Family member/caregiver    Family Member Consulted  husband       Patient will benefit from skilled therapeutic intervention in order to improve the following deficits and impairments:  Abnormal gait, Decreased activity tolerance, Decreased balance, Decreased endurance, Decreased mobility, Decreased range of motion, Decreased scar mobility, Decreased strength, Difficulty walking, Hypomobility, Increased edema, Increased fascial restricitons, Increased muscle spasms, Impaired flexibility, Improper body mechanics, Postural dysfunction, Obesity, Pain  Visit Diagnosis: Acute pain of left knee  Stiffness of left knee, not elsewhere classified  Localized edema  Muscle weakness (generalized)     Problem List Patient Active Problem List   Diagnosis Date Noted  . Depression, major, single episode, mild ([place]) 03/07/2018  . OA (osteoarthritis) of knee 02/07/2018  . Other allergic contact dermatitis 12/20/2017  . Non-allergic rhinitis 12/16/2017  . Allergy to metal 12/16/2017  . Gastroesophageal reflux disease without esophagitis 07/15/2017  . Primary osteoarthritis of both feet 04/29/2017  . Family history of early CAD 04/15/2016  . Primary osteoarthritis of both knees 12/31/2015  . Chest pain 07/13/2015  . Atypical chest pain 07/13/2015  . Major depression 03/19/2015  . Vulvodynia 08/03/2014  . Functional constipation 08/03/2014  . Anxiety 07/06/2014  . Fibromyalgia 04/25/2014  . Snoring 04/22/2014  . Prediabetes 03/18/2014  . Hyperlipidemia 03/18/2014  . Osteopenia 03/06/2014  . History of cardiac catheterization 04/28/2013  . Dyspepsia 02/02/2012  . GERD (gastroesophageal reflux disease) 02/02/2012  . Depression 06/18/2011  . Dysphagia 02/05/2011  . CHEST PAIN 02/11/2009  .  GASTROESOPHAGEAL REFLUX DISEASE, CHRONIC 02/15/2007  . IRRITABLE BOWEL SYNDROME 02/15/2007  . Primary osteoarthritis of both hands 02/15/2007  . Reflux esophagitis 02/14/2007  . GASTRITIS 02/14/2007      Geraldine Solar PT, DPT  [facility] [address] [place], [place], [address] Phone: [phone]   Fax:  [phone]  Name: SHELBYLYNN WALCZYK MRN: [id number] Date  of Birth: 16-Feb-1941

## 2018-03-15 DIAGNOSIS — Z96652 Presence of left artificial knee joint: Secondary | ICD-10-CM | POA: Diagnosis not present

## 2018-03-15 DIAGNOSIS — Z471 Aftercare following joint replacement surgery: Secondary | ICD-10-CM | POA: Diagnosis not present

## 2018-03-16 ENCOUNTER — Telehealth (HOSPITAL_COMMUNITY): Payer: Self-pay

## 2018-03-16 ENCOUNTER — Ambulatory Visit (HOSPITAL_COMMUNITY): Payer: PPO

## 2018-03-16 NOTE — Telephone Encounter (Signed)
She is not feeling well.

## 2018-03-17 ENCOUNTER — Telehealth (HOSPITAL_COMMUNITY): Payer: Self-pay | Admitting: Physical Therapy

## 2018-03-17 NOTE — Telephone Encounter (Signed)
L/m a message request for pt to call me back about the schedule. If she calls back just tell her we worked it out and her schedule didi not change. NF 03/17/2018

## 2018-03-18 ENCOUNTER — Telehealth (HOSPITAL_COMMUNITY): Payer: Self-pay | Admitting: Physical Therapy

## 2018-03-18 ENCOUNTER — Ambulatory Visit (INDEPENDENT_AMBULATORY_CARE_PROVIDER_SITE_OTHER): Payer: PPO | Admitting: Family Medicine

## 2018-03-18 ENCOUNTER — Ambulatory Visit (HOSPITAL_COMMUNITY): Payer: PPO | Admitting: Physical Therapy

## 2018-03-18 ENCOUNTER — Encounter: Payer: Self-pay | Admitting: Family Medicine

## 2018-03-18 VITALS — Ht 65.0 in | Wt 216.0 lb

## 2018-03-18 DIAGNOSIS — F411 Generalized anxiety disorder: Secondary | ICD-10-CM

## 2018-03-18 MED ORDER — HYDROXYZINE HCL 25 MG PO TABS: 25.0000 mg | ORAL_TABLET | Freq: Four times a day (QID) | ORAL | 2 refills | Status: DC | PRN

## 2018-03-18 NOTE — Telephone Encounter (Signed)
Patients husband called to cancel her appt she is having a panic attack.

## 2018-03-18 NOTE — Progress Notes (Signed)
   Subjective:    [name], female    DOB: 08/18/1940, 78 y.o.   MRN: [id number]  HPI  Patient arrives to discuss recent medications. Patient states she is unable to the Valium and Klonopin because it makes her feel even worse and doesn't feel the depression medication is helping enough. Patient states she does feel some better but still having episodes.  Review of Systems     Objective:   Physical Exam  15 minutes was spent with patient today discussing healthcare issues which they came.  More than 50% of this visit-total duration of visit-was spent in counseling and coordination of care.  Please see diagnosis regarding the focus of this coordination and care  Lungs are clear respiratory rate normal heart regular no murmurs extremities no edema except for where she had her knee surgery Patient denies being suicidal  Patient has severe anxiety disorder I counseled her regarding potentially going to a mental health specialist she does not want to do that    Assessment & Plan:  Extreme anxiousness along with depression tolerating Lexapro She tried the Valium and the clonazepam and she felt like it made her worse so she stopped taking it right away I encouraged her to retry it and she can even try doubling the dose to see if it gets better control if she is too scared to do that she can use Vistaril 25 mg every 4 hours as needed she will follow-up in a few weeks she will notify us if any problems

## 2018-03-23 ENCOUNTER — Encounter (HOSPITAL_COMMUNITY): Payer: Self-pay

## 2018-03-23 ENCOUNTER — Ambulatory Visit (HOSPITAL_COMMUNITY): Payer: PPO | Attending: Student

## 2018-03-23 DIAGNOSIS — M6281 Muscle weakness (generalized): Secondary | ICD-10-CM

## 2018-03-23 DIAGNOSIS — M25562 Pain in left knee: Secondary | ICD-10-CM | POA: Diagnosis not present

## 2018-03-23 DIAGNOSIS — R600 Localized edema: Secondary | ICD-10-CM

## 2018-03-23 DIAGNOSIS — M25662 Stiffness of left knee, not elsewhere classified: Secondary | ICD-10-CM

## 2018-03-23 NOTE — Therapy (Signed)
[place], [place], [address] Phone: [phone]   Fax:  [phone]  Physical Therapy Treatment  Patient Details  Name: Ruth [name] MRN: [id number] Date of Birth: [birth year] Referring Provider (PT): Theresa Duty, PA-C Gaynelle Arabian, MD)   Encounter Date: 03/23/2018  PT End of Session - 03/23/18 1456    Visit Number  9    Number of Visits  19    Date for PT Re-Evaluation  04/05/18   Minireassess complete 03/14/18   Authorization Type  [employer]    Authorization Time Period  02/22/18 to 04/05/18    Authorization - Visit Number  9    Authorization - Number of Visits  19    PT Start Time  7412    PT Stop Time  8786    PT Time Calculation (min)  40 min    Activity Tolerance  Patient tolerated treatment well;No increased pain    Behavior During Therapy  WFL for tasks assessed/performed       Past Medical History:  Diagnosis Date  . Anxiety   . Arthritis   . Asthma   . Depression   . Eczema   . Fibromyalgia   . GERD (gastroesophageal reflux disease)    EGD/ colon 1/09  . H pylori ulcer 1980-1990   s/p treatment  . Headache   . Hyperlipidemia   . IBS (irritable bowel syndrome)   . Laryngospasm   . Neck pain   . OSA (obstructive sleep apnea)   . PONV (postoperative nausea and vomiting)   . Refusal of blood transfusions as patient is Jehovah's Witness   . Vertigo     Past Surgical History:  Procedure Laterality Date  . ABDOMINAL HYSTERECTOMY  1970  . APPENDECTOMY    . BILATERAL SALPINGOOPHORECTOMY  1990s  . CARDIAC CATHETERIZATION  2002   [facility]) normal coronary arteries   . COLONOSCOPY  01/2007   Dr. Meriel Flavors  . COLONOSCOPY N/A 04/18/2015   Procedure: COLONOSCOPY;  Surgeon: Rogene Houston, MD;  [facility];  Service: Endoscopy;  Laterality: N/A;  830 - moved to 8:55 - Ann to notify pt  . COLONOSCOPY WITH ESOPHAGOGASTRODUODENOSCOPY (EGD)  02/16/2012   Procedure:  COLONOSCOPY WITH ESOPHAGOGASTRODUODENOSCOPY (EGD);  Surgeon: Daneil Dolin, MD;  [facility];  Service: Endoscopy;  Laterality: N/A;  8:45  . ESOPHAGEAL DILATION N/A 06/07/2014   Procedure: ESOPHAGEAL DILATION;  Surgeon: Rogene Houston, MD;  [facility];  Service: Endoscopy;  Laterality: N/A;  . ESOPHAGOGASTRODUODENOSCOPY  01/2007   Dr Sharlett Iles- 3 cm hiatal hernia, benign esophageal biopsies, erosive esophagitis, gastritis  . ESOPHAGOGASTRODUODENOSCOPY N/A 06/07/2014   Procedure: ESOPHAGOGASTRODUODENOSCOPY (EGD);  Surgeon: Rogene Houston, MD;  [facility];  Service: Endoscopy;  Laterality: N/A;  1200  . ESOPHAGOGASTRODUODENOSCOPY N/A 08/18/2017   Procedure: ESOPHAGOGASTRODUODENOSCOPY (EGD);  Surgeon: Rogene Houston, MD;  [facility];  Service: Endoscopy;  Laterality: N/A;  2:20  . FOOT SURGERY    . INNER EAR SURGERY     Left  . KNEE SURGERY     Multiple  . SHOULDER SURGERY  2011  . TONSILLECTOMY    . TOTAL KNEE ARTHROPLASTY Left 02/07/2018   Procedure: TOTAL KNEE ARTHROPLASTY;  Surgeon: Gaynelle Arabian, MD;  Location: [facility];  Service: Orthopedics;  Laterality: Left;  65mn  . TUBAL LIGATION    . TYMPANOSTOMY TUBE PLACEMENT      There were no vitals  filed for this visit.  Subjective Assessment - 03/23/18 1435    Subjective  Pt stated her knee can feel the rain coming, current pain scale 6-7/10 sore and dull pain with some stiffness.    Patient Stated Goals  be able to function and stand and walk, potentially squat    Currently in Pain?  Yes    Pain Score  7     Pain Location  Knee    Pain Orientation  Left    Pain Descriptors / Indicators  Aching;Dull    Pain Type  Surgical pain    Pain Onset  1 to 4 weeks ago    Pain Frequency  Constant    Aggravating Factors   WB, standing, walking, bending    Pain Relieving Factors  pain medication, ice    Effect of Pain on Daily Activities  increases                        [facility] Adult PT Treatment/Exercise - 03/23/18 0001      Knee/Hip Exercises: Stretches   Knee: Self-Stretch to increase Flexion  Left    Knee: Self-Stretch Limitations  10x10" holds 8" step      Knee/Hip Exercises: Standing   Rocker Board  2 minutes    Rocker Board Limitations  lateral      Knee/Hip Exercises: Supine   Quad Sets  10 reps    Quad Sets Limitations  cueing for quad contractoin    Short Arc [employer]  Left;15 reps    Short Arc Quad Sets Limitations  2-3" holds     Heel Slides  Left;10 reps    Heel Slides Limitations  3-5" holds at end range flexion    Knee Extension  AROM    Knee Extension Limitations  4    Knee Flexion  AROM    Knee Flexion Limitations  72      Modalities   Modalities  Moist Heat      Moist Heat Therapy   Number Minutes Moist Heat  15 Minutes    Moist Heat Location  Hip   during supine/seated therex and manual     Manual Therapy   Manual Therapy  Myofascial release    Manual therapy comments  Manual complete separate from rest of tx    Joint Mobilization  Patella mobs all direction for ROM    Myofascial Release  scar tissue to reduce adhesions on insicion             PT Education - 03/23/18 1525    Education Details  Education wiht manipulation procedure for range of motion, educated importance of knee mobility wiht gait, sitting and stairs    Person(s) Educated  Patient;Spouse    Methods  Explanation    Comprehension  Verbalized understanding       PT Short Term Goals - 03/14/18 1525      PT SHORT TERM GOAL #1   Title  Pt will be independent with and consistently perform HEP to improve ROM and reduce pain.     Time  3    Period  Weeks    Status  Achieved      PT SHORT TERM GOAL #2   Title  Pt will have reduced edema by 2cm or > at joint line in order to maximize ROM and reduce pain.     Baseline  1/27: decreased by 1cm    Time  3  Period  Weeks    Status  Partially Met      PT SHORT  TERM GOAL #3   Title  Pt will have improved L knee AROM from 5 to 75 deg in order to reduce pain and maximize gait.     Baseline  1/27: 5-61deg    Time  3    Period  Weeks    Status  Partially Met        PT Long Term Goals - 03/14/18 1525      PT LONG TERM GOAL #1   Title  Pt will have improved L knee AROM from 0 to 110deg in order to further reduce pain and maximize stair ambulation.     Baseline  1/27: 5-61deg    Time  6    Period  Weeks    Status  On-going      PT LONG TERM GOAL #2   Title  Pt will have improved MMT to 4/5 or > throughout mm groups tested in order to maximize gait and functional mobility.     Baseline  1/27: see MMT    Time  6    Period  Weeks    Status  Partially Met      PT LONG TERM GOAL #3   Title  Pt will be able to perform 12STS during 30sec chair rise test without UE support and proper form in order to demo improved balance and functional strength.     Baseline  1/27: 8x, no UE support    Time  6    Period  Weeks    Status  On-going      PT LONG TERM GOAL #4   Title  Pt will be able to perform L SLS for 10sec or > to demo improved balance, core and functional strength in order to maximize gait, stairs, and other functional mobility.     Baseline  1/27: 56sec LLE    Time  6    Period  Weeks    Status  Achieved      PT LONG TERM GOAL #5   Title  Pt will be able to ambulate at least 449f during 3MWT with LRAD, 2/10 L knee pain, and gait WFL in order to demo improved tolerance to functional mobility and maximize her HH ambulation.    Baseline  1/27: 5665f but still using rollator and gait deviations    Time  6    Period  Weeks    Status  Partially Met            Plan - 03/23/18 1517    Clinical Impression Statement  Pt stated MD recommended manipulation due to knee mobility and reports slight frustation with knee progress.  Pt explained importance of knee mobility to assist with gait, chairs and stairs.  Session focus on knee mobility  with stretches, strengthening and manual techniques.  Noted significant adhesions present on incision, pt educated on purpose and manual scar tissue mobilization complete this session.  Following manual and stretches, pt presents wiht improved AROM 4-72 degrees (was 5-61 degrees last session.)  Added rockerboard to improve knee flexion during gait.  No reports of increased pain through sessoin.      Rehab Potential  Fair    PT Frequency  3x / week    PT Duration  6 weeks    PT Treatment/Interventions  ADLs/Self Care Home Management;Aquatic Therapy;Cryotherapy;Electrical Stimulation;Moist Heat;Gait training;DME Instruction;Stair training;Functional mobility training;Therapeutic activities;Therapeutic exercise;Balance training;Neuromuscular re-education;Patient/family education;Manual techniques;Compression  bandaging;Scar mobilization;Orthotic Fit/Training;Passive range of motion;Dry needling;Energy conservation;Taping    PT Next Visit Plan  continue heavy focus on AROM, particularly flexion, and edema initially; heat to L hip during supine exercises     PT Home Exercise Plan  eval: quad set, supine heel slide, seated HS stretch, seated knee flexion, hooklying SKTC (husband to assist)    Consulted and Agree with Plan of Care  Patient       Patient will benefit from skilled therapeutic intervention in order to improve the following deficits and impairments:  Abnormal gait, Decreased activity tolerance, Decreased balance, Decreased endurance, Decreased mobility, Decreased range of motion, Decreased scar mobility, Decreased strength, Difficulty walking, Hypomobility, Increased edema, Increased fascial restricitons, Increased muscle spasms, Impaired flexibility, Improper body mechanics, Postural dysfunction, Obesity, Pain  Visit Diagnosis: Acute pain of left knee  Stiffness of left knee, not elsewhere classified  Muscle weakness (generalized)  Localized edema     Problem List Patient Active  Problem List   Diagnosis Date Noted  . Depression, major, single episode, mild ([place]) 03/07/2018  . OA (osteoarthritis) of knee 02/07/2018  . Other allergic contact dermatitis 12/20/2017  . Non-allergic rhinitis 12/16/2017  . Allergy to metal 12/16/2017  . Gastroesophageal reflux disease without esophagitis 07/15/2017  . Primary osteoarthritis of both feet 04/29/2017  . Family history of early CAD 04/15/2016  . Primary osteoarthritis of both knees 12/31/2015  . Chest pain 07/13/2015  . Atypical chest pain 07/13/2015  . Major depression 03/19/2015  . Vulvodynia 08/03/2014  . Functional constipation 08/03/2014  . Anxiety 07/06/2014  . Fibromyalgia 04/25/2014  . Snoring 04/22/2014  . Prediabetes 03/18/2014  . Hyperlipidemia 03/18/2014  . Osteopenia 03/06/2014  . History of cardiac catheterization 04/28/2013  . Dyspepsia 02/02/2012  . GERD (gastroesophageal reflux disease) 02/02/2012  . Depression 06/18/2011  . Dysphagia 02/05/2011  . CHEST PAIN 02/11/2009  . GASTROESOPHAGEAL REFLUX DISEASE, CHRONIC 02/15/2007  . IRRITABLE BOWEL SYNDROME 02/15/2007  . Primary osteoarthritis of both hands 02/15/2007  . Reflux esophagitis 02/14/2007  . GASTRITIS 02/14/2007   Ihor Austin, [place]; [place]  Aldona Lento 03/23/2018, 3:27 PM  [facility] [address] [place], [place], [address] Phone: [phone]   Fax:  [phone]  Name: LETISHA YERA MRN: [id number] Date of Birth: 11/07/1940

## 2018-03-30 ENCOUNTER — Encounter (HOSPITAL_COMMUNITY): Payer: Self-pay

## 2018-03-30 ENCOUNTER — Ambulatory Visit (HOSPITAL_COMMUNITY): Payer: PPO

## 2018-03-30 DIAGNOSIS — M6281 Muscle weakness (generalized): Secondary | ICD-10-CM

## 2018-03-30 DIAGNOSIS — M25562 Pain in left knee: Secondary | ICD-10-CM | POA: Diagnosis not present

## 2018-03-30 DIAGNOSIS — R600 Localized edema: Secondary | ICD-10-CM

## 2018-03-30 DIAGNOSIS — M25662 Stiffness of left knee, not elsewhere classified: Secondary | ICD-10-CM

## 2018-03-30 NOTE — Therapy (Signed)
[place], [place], [address] Phone: [phone]   Fax:  [phone]  Physical Therapy Treatment  Patient Details  Name: Ruth [name] MRN: [id number] Date of Birth: [birth year] Referring Provider (PT): Theresa Duty, PA-C (Surgeon: Gaynelle Arabian, MD) Progress Note Reporting Period 02/22/18 to 03/30/18  See note below for Objective Data and Assessment of Progress/Goals.    Encounter Date: 03/30/2018  PT End of Session - 03/30/18 1125    Visit Number  10    Number of Visits  19    Date for PT Re-Evaluation  04/05/18   Minireassess complete 03/14/18   Authorization Type  [employer]    Authorization Time Period  02/22/18 to 04/05/18    Authorization - Visit Number  10    Authorization - Number of Visits  19    PT Start Time  4401   3' on bike, not included with charges   PT Stop Time  1120    PT Time Calculation (min)  42 min    Activity Tolerance  Patient tolerated treatment well;No increased pain    Behavior During Therapy  WFL for tasks assessed/performed       Past Medical History:  Diagnosis Date  . Anxiety   . Arthritis   . Asthma   . Depression   . Eczema   . Fibromyalgia   . GERD (gastroesophageal reflux disease)    EGD/ colon 1/09  . H pylori ulcer 1980-1990   s/p treatment  . Headache   . Hyperlipidemia   . IBS (irritable bowel syndrome)   . Laryngospasm   . Neck pain   . OSA (obstructive sleep apnea)   . PONV (postoperative nausea and vomiting)   . Refusal of blood transfusions as patient is Jehovah's Witness   . Vertigo     Past Surgical History:  Procedure Laterality Date  . ABDOMINAL HYSTERECTOMY  1970  . APPENDECTOMY    . BILATERAL SALPINGOOPHORECTOMY  1990s  . CARDIAC CATHETERIZATION  2002   [facility]) normal coronary arteries   . COLONOSCOPY  01/2007   Dr. Meriel Flavors  . COLONOSCOPY N/A 04/18/2015   Procedure: COLONOSCOPY;  Surgeon: Rogene Houston, MD;   [facility];  Service: Endoscopy;  Laterality: N/A;  830 - moved to 8:55 - Ann to notify pt  . COLONOSCOPY WITH ESOPHAGOGASTRODUODENOSCOPY (EGD)  02/16/2012   Procedure: COLONOSCOPY WITH ESOPHAGOGASTRODUODENOSCOPY (EGD);  Surgeon: Daneil Dolin, MD;  [facility];  Service: Endoscopy;  Laterality: N/A;  8:45  . ESOPHAGEAL DILATION N/A 06/07/2014   Procedure: ESOPHAGEAL DILATION;  Surgeon: Rogene Houston, MD;  [facility];  Service: Endoscopy;  Laterality: N/A;  . ESOPHAGOGASTRODUODENOSCOPY  01/2007   Dr Sharlett Iles- 3 cm hiatal hernia, benign esophageal biopsies, erosive esophagitis, gastritis  . ESOPHAGOGASTRODUODENOSCOPY N/A 06/07/2014   Procedure: ESOPHAGOGASTRODUODENOSCOPY (EGD);  Surgeon: Rogene Houston, MD;  [facility];  Service: Endoscopy;  Laterality: N/A;  1200  . ESOPHAGOGASTRODUODENOSCOPY N/A 08/18/2017   Procedure: ESOPHAGOGASTRODUODENOSCOPY (EGD);  Surgeon: Rogene Houston, MD;  [facility];  Service: Endoscopy;  Laterality: N/A;  2:20  . FOOT SURGERY    . INNER EAR SURGERY     Left  . KNEE SURGERY     Multiple  . SHOULDER SURGERY  2011  . TONSILLECTOMY    . TOTAL KNEE ARTHROPLASTY Left 02/07/2018   Procedure: TOTAL KNEE ARTHROPLASTY;  Surgeon: Gaynelle Arabian, MD;  [facility];  Service: Orthopedics;  Laterality: Left;  60mn  . TUBAL LIGATION    . TYMPANOSTOMY TUBE PLACEMENT      There were no vitals filed for this visit.  Subjective Assessment - 03/30/18 1042    Subjective  Pt stated she walked at mall yesterday.  Knee continues to be stiff    Patient Stated Goals  be able to function and stand and walk, potentially squat    Currently in Pain?  Yes    Pain Score  6     Pain Location  Knee    Pain Orientation  Left    Pain Descriptors / Indicators  Tightness    Pain Type  Surgical pain    Pain Frequency  Constant    Aggravating Factors   WB, standing, walking, bending    Pain Relieving Factors  pain  medication, ice    Effect of Pain on Daily Activities  increases         [facility] PT Assessment - 03/30/18 0001      Assessment   Medical Diagnosis  L TKA    Referring Provider (PT)  KTheresa Duty PA-C   Surgeon: FGaynelle Arabian MD   Onset Date/Surgical Date  02/07/18    Prior Therapy  HHPT d/c on 02/18/18      Observation/Other Assessments   Focus on Therapeutic Outcomes (FOTO)   62% limited (was 70%)                   [facility] Adult PT Treatment/Exercise - 03/30/18 0001      Knee/Hip Exercises: Stretches   Knee: Self-Stretch to increase Flexion  Left    Knee: Self-Stretch Limitations  10x10" holds 8" step      Knee/Hip Exercises: Aerobic   Stationary Bike  x3 mins, seat 14, 1/2 revolutions for ROM      Knee/Hip Exercises: Seated   Heel Slides  Left;10 reps    Heel Slides Limitations  3-5" holds      Knee/Hip Exercises: Supine   Quad Sets  10 reps    Quad Sets Limitations  cueing to improve quad contraction, towel under knee and tactile cueing    Heel Slides  Left;10 reps    Heel Slides Limitations  3-5" holds at end range flexion    Knee Extension  AROM    Knee Extension Limitations  4    Knee Flexion  AROM    Knee Flexion Limitations  76   was 72 last session     Manual Therapy   Manual Therapy  Myofascial release;Edema management    Manual therapy comments  Manual complete separate from rest of tx    Edema Management  Retrograde massage with LE elevated    Joint Mobilization  Patella mobs all direction for ROM    Myofascial Release  scar tissue to reduce adhesions on insicion             PT Education - 03/30/18 1130    Education Details  Educated benefits wiht compression hose to assist with edema control    Person(s) Educated  Patient;Spouse    Methods  Explanation;Handout    Comprehension  Verbalized understanding       PT Short Term Goals - 03/14/18 1525      PT SHORT TERM GOAL #1   Title  Pt will be independent with and consistently  perform HEP to improve ROM and reduce pain.     Time  3    Period  Weeks  Status  Achieved      PT SHORT TERM GOAL #2   Title  Pt will have reduced edema by 2cm or > at joint line in order to maximize ROM and reduce pain.     Baseline  1/27: decreased by 1cm    Time  3    Period  Weeks    Status  Partially Met      PT SHORT TERM GOAL #3   Title  Pt will have improved L knee AROM from 5 to 75 deg in order to reduce pain and maximize gait.     Baseline  1/27: 5-61deg    Time  3    Period  Weeks    Status  Partially Met        PT Long Term Goals - 03/14/18 1525      PT LONG TERM GOAL #1   Title  Pt will have improved L knee AROM from 0 to 110deg in order to further reduce pain and maximize stair ambulation.     Baseline  1/27: 5-61deg    Time  6    Period  Weeks    Status  On-going      PT LONG TERM GOAL #2   Title  Pt will have improved MMT to 4/5 or > throughout mm groups tested in order to maximize gait and functional mobility.     Baseline  1/27: see MMT    Time  6    Period  Weeks    Status  Partially Met      PT LONG TERM GOAL #3   Title  Pt will be able to perform 12STS during 30sec chair rise test without UE support and proper form in order to demo improved balance and functional strength.     Baseline  1/27: 8x, no UE support    Time  6    Period  Weeks    Status  On-going      PT LONG TERM GOAL #4   Title  Pt will be able to perform L SLS for 10sec or > to demo improved balance, core and functional strength in order to maximize gait, stairs, and other functional mobility.     Baseline  1/27: 56sec LLE    Time  6    Period  Weeks    Status  Achieved      PT LONG TERM GOAL #5   Title  Pt will be able to ambulate at least 421f during 3MWT with LRAD, 2/10 L knee pain, and gait WFL in order to demo improved tolerance to functional mobility and maximize her HH ambulation.    Baseline  1/27: 5639f but still using rollator and gait deviations    Time  6     Period  Weeks    Status  Partially Met            Plan - 03/30/18 1126    Clinical Impression Statement  Continued session focus with knee mobility and manual technqiues.  Resumed rocking on bike and stretches for flexibility.  Manual focus on myofascial release, retrograde massage for edema and joint mobilization wiht LE elevated.  Pt educated on benefits with compression garmets, measurements taken and paperwork given for compression garmet store [place] No reports of increased pain through session.  AROM 4-76 degrees (was 72 degrees last session.)    Rehab Potential  Fair    PT Frequency  3x / week    PT  Duration  6 weeks    PT Treatment/Interventions  ADLs/Self Care Home Management;Aquatic Therapy;Cryotherapy;Electrical Stimulation;Moist Heat;Gait training;DME Instruction;Stair training;Functional mobility training;Therapeutic activities;Therapeutic exercise;Balance training;Neuromuscular re-education;Patient/family education;Manual techniques;Compression bandaging;Scar mobilization;Orthotic Fit/Training;Passive range of motion;Dry needling;Energy conservation;Taping    PT Next Visit Plan  Next session add seated contract/relax for flexion.  continue heavy focus on AROM, particularly flexion, and edema initially; heat to L hip during supine exercises     PT Home Exercise Plan  eval: quad set, supine heel slide, seated HS stretch, seated knee flexion, hooklying SKTC (husband to assist)       Patient will benefit from skilled therapeutic intervention in order to improve the following deficits and impairments:  Abnormal gait, Decreased activity tolerance, Decreased balance, Decreased endurance, Decreased mobility, Decreased range of motion, Decreased scar mobility, Decreased strength, Difficulty walking, Hypomobility, Increased edema, Increased fascial restricitons, Increased muscle spasms, Impaired flexibility, Improper body mechanics, Postural dysfunction, Obesity, Pain  Visit  Diagnosis: Acute pain of left knee  Stiffness of left knee, not elsewhere classified  Muscle weakness (generalized)  Localized edema     Problem List Patient Active Problem List   Diagnosis Date Noted  . Depression, major, single episode, mild ([facility]) 03/07/2018  . OA (osteoarthritis) of knee 02/07/2018  . Other allergic contact dermatitis 12/20/2017  . Non-allergic rhinitis 12/16/2017  . Allergy to metal 12/16/2017  . Gastroesophageal reflux disease without esophagitis 07/15/2017  . Primary osteoarthritis of both feet 04/29/2017  . Family history of early CAD 04/15/2016  . Primary osteoarthritis of both knees 12/31/2015  . Chest pain 07/13/2015  . Atypical chest pain 07/13/2015  . Major depression 03/19/2015  . Vulvodynia 08/03/2014  . Functional constipation 08/03/2014  . Anxiety 07/06/2014  . Fibromyalgia 04/25/2014  . Snoring 04/22/2014  . Prediabetes 03/18/2014  . Hyperlipidemia 03/18/2014  . Osteopenia 03/06/2014  . History of cardiac catheterization 04/28/2013  . Dyspepsia 02/02/2012  . GERD (gastroesophageal reflux disease) 02/02/2012  . Depression 06/18/2011  . Dysphagia 02/05/2011  . CHEST PAIN 02/11/2009  . GASTROESOPHAGEAL REFLUX DISEASE, CHRONIC 02/15/2007  . IRRITABLE BOWEL SYNDROME 02/15/2007  . Primary osteoarthritis of both hands 02/15/2007  . Reflux esophagitis 02/14/2007  . GASTRITIS 02/14/2007   Ihor Austin, [employer]; [place]  Aldona Lento 03/30/2018, 12:04 PM  [facility] [address] [place], [place], [address] Phone: [phone]   Fax:  [phone]  Name: Ruth [name] MRN: [id number] Date of Birth: 01/02/1941

## 2018-04-01 ENCOUNTER — Ambulatory Visit (HOSPITAL_COMMUNITY): Payer: PPO

## 2018-04-06 ENCOUNTER — Ambulatory Visit (HOSPITAL_COMMUNITY): Payer: PPO | Admitting: Physical Therapy

## 2018-04-06 ENCOUNTER — Telehealth (HOSPITAL_COMMUNITY): Payer: Self-pay | Admitting: Physical Therapy

## 2018-04-06 DIAGNOSIS — M1711 Unilateral primary osteoarthritis, right knee: Secondary | ICD-10-CM | POA: Diagnosis not present

## 2018-04-06 NOTE — Telephone Encounter (Signed)
Pt seen MD this morning and got an injection in the Knee, she will return to PT on Friday

## 2018-04-07 ENCOUNTER — Ambulatory Visit (INDEPENDENT_AMBULATORY_CARE_PROVIDER_SITE_OTHER): Payer: PPO | Admitting: Family Medicine

## 2018-04-07 ENCOUNTER — Encounter: Payer: Self-pay | Admitting: Family Medicine

## 2018-04-07 VITALS — BP 112/78 | Ht 65.0 in | Wt 212.0 lb

## 2018-04-07 DIAGNOSIS — F411 Generalized anxiety disorder: Secondary | ICD-10-CM | POA: Diagnosis not present

## 2018-04-07 DIAGNOSIS — F320 Major depressive disorder, single episode, mild: Secondary | ICD-10-CM | POA: Diagnosis not present

## 2018-04-07 MED ORDER — HYDROXYZINE HCL 25 MG PO TABS: 25.0000 mg | ORAL_TABLET | Freq: Four times a day (QID) | ORAL | 2 refills | Status: DC | PRN

## 2018-04-07 MED ORDER — KETOCONAZOLE 2 % EX CREA: 1.0000 "application " | TOPICAL_CREAM | Freq: Every day | CUTANEOUS | 2 refills | Status: DC

## 2018-04-07 MED ORDER — ESCITALOPRAM OXALATE 10 MG PO TABS: 10.0000 mg | ORAL_TABLET | Freq: Every day | ORAL | 1 refills | Status: DC

## 2018-04-07 MED ORDER — DICYCLOMINE HCL 10 MG PO CAPS: 10.0000 mg | ORAL_CAPSULE | Freq: Three times a day (TID) | ORAL | 5 refills | Status: DC | PRN

## 2018-04-07 NOTE — Progress Notes (Signed)
   Subjective:    [name], female    DOB: [birth year], 78 y.o.   MRN: [id number]  HPI  Patient is here today to follow up on her depression and anxiety. Patient relates her moods are doing better she is tolerating the medicine well denies abusing the medicine. She is tolerating her Lexapro 10 mg per (she will need refills). Also taking Vistaril 25 mg one po prn.  She is some better as far as the crying,but outlook still down.  She also has rigid residual left knee stiffness related to recent surgery she is hoping it would get better with physical therapy she states she will not do any further surgery  Review of Systems  Constitutional: Negative for activity change, fatigue and fever.  HENT: Negative for congestion and rhinorrhea.   Respiratory: Negative for cough, chest tightness and shortness of breath.   Cardiovascular: Negative for chest pain and leg swelling.  Gastrointestinal: Negative for abdominal pain and nausea.  Skin: Negative for color change.  Neurological: Negative for dizziness and headaches.  Psychiatric/Behavioral: Negative for agitation and behavioral problems.       Objective:   Physical Exam Vitals signs reviewed.  Constitutional:      General: She is not in acute distress. HENT:     Head: Normocephalic.  Cardiovascular:     Rate and Rhythm: Normal rate and regular rhythm.     Heart sounds: Normal heart sounds. No murmur.  Pulmonary:     Effort: Pulmonary effort is normal.     Breath sounds: Normal breath sounds.  Lymphadenopathy:     Cervical: No cervical adenopathy.  Neurological:     Mental Status: She is alert.  Psychiatric:        Behavior: Behavior normal.           Assessment & Plan:  Depression doing better on medicine continue current medication follow-up in 6 weeks follow-up sooner problems  Left knee stiffness related to recent surgery physical therapy is reasonable patient does not want to do any type of surgery  for this  Follow-up sooner if any health issues

## 2018-04-08 ENCOUNTER — Encounter (HOSPITAL_COMMUNITY): Payer: Self-pay | Admitting: Physical Therapy

## 2018-04-08 ENCOUNTER — Ambulatory Visit (HOSPITAL_COMMUNITY): Payer: PPO | Admitting: Physical Therapy

## 2018-04-08 DIAGNOSIS — M25662 Stiffness of left knee, not elsewhere classified: Secondary | ICD-10-CM

## 2018-04-08 DIAGNOSIS — R600 Localized edema: Secondary | ICD-10-CM

## 2018-04-08 DIAGNOSIS — M6281 Muscle weakness (generalized): Secondary | ICD-10-CM

## 2018-04-08 DIAGNOSIS — M25562 Pain in left knee: Secondary | ICD-10-CM

## 2018-04-08 NOTE — Patient Instructions (Signed)
(  Home) Squat: (Assist)    Using supports, arms close to body, squat by dropping hips back as if sitting in a chair. Use something with more support than a chair like a countertop.  Repeat _10___ times per set. Do __1__ sets per session. Do __7__ sessions per week.  Copyright  [employer]. All rights reserved.

## 2018-04-08 NOTE — Therapy (Signed)
[place], [place], [address] Phone: [phone]   Fax:  [phone]  Physical Therapy Treatment / Re-assessment  Patient Details  Name: Ruth [name] MRN: [id number] Date of Birth: 04/27/1940 Referring Provider (PT): Theresa Duty, [place]   Encounter Date: 04/08/2018  Progress Note Reporting Period 03/14/18 to 04/08/18  See note below for Objective Data and Assessment of Progress/Goals.       PT End of Session - 04/08/18 1401    Visit Number  11    Number of Visits  19    Date for PT Re-Evaluation  04/29/18    Authorization Type  [employer]    Authorization Time Period  02/22/18 to 04/05/18; New: 04/08/18 - 04/29/18    Authorization - Visit Number  11    Authorization - Number of Visits  19    PT Start Time  9211    PT Stop Time  1350    PT Time Calculation (min)  52 min    Activity Tolerance  Patient tolerated treatment well;No increased pain    Behavior During Therapy  WFL for tasks assessed/performed       Past Medical History:  Diagnosis Date  . Anxiety   . Arthritis   . Asthma   . Depression   . Eczema   . Fibromyalgia   . GERD (gastroesophageal reflux disease)    EGD/ colon 1/09  . H pylori ulcer 1980-1990   s/p treatment  . Headache   . Hyperlipidemia   . IBS (irritable bowel syndrome)   . Laryngospasm   . Neck pain   . OSA (obstructive sleep apnea)   . PONV (postoperative nausea and vomiting)   . Refusal of blood transfusions as patient is Jehovah's Witness   . Vertigo     Past Surgical History:  Procedure Laterality Date  . ABDOMINAL HYSTERECTOMY  1970  . APPENDECTOMY    . BILATERAL SALPINGOOPHORECTOMY  1990s  . CARDIAC CATHETERIZATION  2002   [facility]) normal coronary arteries   . COLONOSCOPY  01/2007   Dr. Meriel Flavors  . COLONOSCOPY N/A 04/18/2015   Procedure: COLONOSCOPY;  Surgeon: Rogene Houston, MD;  Location: [facility];  Service: Endoscopy;  Laterality:  N/A;  830 - moved to 8:55 - Ann to notify pt  . COLONOSCOPY WITH ESOPHAGOGASTRODUODENOSCOPY (EGD)  02/16/2012   Procedure: COLONOSCOPY WITH ESOPHAGOGASTRODUODENOSCOPY (EGD);  Surgeon: Daneil Dolin, MD;  Location: [facility];  Service: Endoscopy;  Laterality: N/A;  8:45  . ESOPHAGEAL DILATION N/A 06/07/2014   Procedure: ESOPHAGEAL DILATION;  Surgeon: Rogene Houston, MD;  Location: [facility];  Service: Endoscopy;  Laterality: N/A;  . ESOPHAGOGASTRODUODENOSCOPY  01/2007   Dr Sharlett Iles- 3 cm hiatal hernia, benign esophageal biopsies, erosive esophagitis, gastritis  . ESOPHAGOGASTRODUODENOSCOPY N/A 06/07/2014   Procedure: ESOPHAGOGASTRODUODENOSCOPY (EGD);  Surgeon: Rogene Houston, MD;  [facility]: [facility];  Service: Endoscopy;  Laterality: N/A;  1200  . ESOPHAGOGASTRODUODENOSCOPY N/A 08/18/2017   Procedure: ESOPHAGOGASTRODUODENOSCOPY (EGD);  Surgeon: Rogene Houston, MD;  Location: [facility];  Service: Endoscopy;  Laterality: N/A;  2:20  . FOOT SURGERY    . INNER EAR SURGERY     Left  . KNEE SURGERY     Multiple  . SHOULDER SURGERY  2011  . TONSILLECTOMY    . TOTAL KNEE ARTHROPLASTY Left 02/07/2018   Procedure: TOTAL KNEE ARTHROPLASTY;  Surgeon: Gaynelle Arabian, MD;  Location: [facility];  Service: Orthopedics;  Laterality:  Left;  2mn  . TUBAL LIGATION    . TYMPANOSTOMY TUBE PLACEMENT      There were no vitals filed for this visit.  Subjective Assessment - 04/08/18 1359    Subjective  Patient reported that she had an injection into her right knee because that knee has been painful. She reported that she has been doing her exercises as able. She stated she feels like she has been making good progress in therapy and could continue to make progress in therapy.     Patient Stated Goals  be able to function and stand and walk, potentially squat    Currently in Pain?  No/denies         [facility]18 0001      Assessment   Medical Diagnosis  L TKA    Referring  Provider (PT)  KTheresa Duty PA-C    Onset Date/Surgical Date  02/07/18      Observation/Other Assessments-Edema    Edema  Circumferential      Circumferential Edema   Circumferential - Right  47cm, joint line   was 47   Circumferential - Left   49 cm at joint line   was 50     Functional Tests   Functional tests  Sit to Stand      Sit to Stand   Comments  30sec chair rise test: 9x, no UE support      AROM   Left Knee Extension  4   was 5   Left Knee Flexion  79   was 61     Strength   Right Hip Flexion  4/5   was 4+   Right Hip Extension  --   Functional assessment: 4-; was 4-   Right Hip ABduction  4/5   was 4-   Left Hip Flexion  4+/5   was 4+   Left Hip Extension  3+/5   Functional assessement. was 3+   Left Hip ABduction  4/5   was 4   Right Knee Flexion  4+/5   was 4+   Right Knee Extension  4+/5   was 4+   Left Knee Flexion  3-/5   was 3-   Left Knee Extension  4+/5    Right Ankle Dorsiflexion  5/5   was 4+   Left Ankle Dorsiflexion  5/5   was 5     Palpation   Palpation comment  Patient without reports of tenderness while therapist palpated around left knee      Ambulation/Gait   Ambulation/Gait  Yes    Ambulation Distance (Feet)  620 Feet   3MWT   Assistive device  4-wheeled walker    Gait Pattern  Step-through pattern;Decreased stance time - left;Decreased hip/knee flexion - left;Trendelenburg;Antalgic    Ambulation Surface  Level;Indoor    Gait velocity  1.0 m/s      Balance   Balance Assessed  Yes      Static Standing Balance   Static Standing - Balance Support  No upper extremity supported    Static Standing Balance -  Activities   Single Leg Stance - Left Leg;Single Leg Stance - Right Leg    Static Standing - Comment/# of Minutes  > 1 minute on the left LE; 43 seconds on the right LE                   [facility] 04[facility] 0001      Knee/Hip Exercises:  Stretches   Knee: Self-Stretch to  increase Flexion  Left    Knee: Self-Stretch Limitations  10x10" holds 8" step      Knee/Hip Exercises: Standing   Functional Squat  10 reps    Functional Squat Limitations  Bilateral upper extremity assistance      Knee/Hip Exercises: Supine   Heel Slides  Left;10 reps    Heel Slides Limitations  3-5" holds at end range flexion             PT Education - 04/08/18 1400    Education Details  Discussed examination findings, plan of care, and HEP.     Person(s) Educated  Patient;Spouse    Methods  Explanation;Handout;Demonstration    Comprehension  Verbalized understanding       PT Short Term Goals - 04/08/18 1407      PT SHORT TERM GOAL #1   Title  Pt will be independent with and consistently perform HEP to improve ROM and reduce pain.     Time  3    Period  Weeks    Status  Achieved      PT SHORT TERM GOAL #2   Title  Pt will have reduced edema by 2cm or > at joint line in order to maximize ROM and reduce pain.     Baseline  04/08/18: see objective measures    Time  3    Period  Weeks    Status  Partially Met      PT SHORT TERM GOAL #3   Title  Pt will have improved L knee AROM from 5 to 75 deg in order to reduce pain and maximize gait.     Baseline  04/08/18: 4-79 degrees     Time  3    Period  Weeks    Status  Achieved        PT Long Term Goals - 04/08/18 1408      PT LONG TERM GOAL #1   Title  Pt will have improved L knee AROM from 0 to 110deg in order to further reduce pain and maximize stair ambulation.     Baseline  04/08/18: 4-79 degrees    Time  6    Period  Weeks    Status  On-going      PT LONG TERM GOAL #2   Title  Pt will have improved MMT to 4/5 or > throughout mm groups tested in order to maximize gait and functional mobility.     Baseline  04/08/18: see MMT    Time  6    Period  Weeks    Status  Partially Met      PT LONG TERM GOAL #3   Title  Pt will be able to perform 12STS during 30sec chair rise test without UE support and proper form  in order to demo improved balance and functional strength.     Baseline  04/08/18: 9x, no UE support    Time  6    Period  Weeks    Status  On-going      PT LONG TERM GOAL #4   Title  Pt will be able to perform L SLS for 10sec or > to demo improved balance, core and functional strength in order to maximize gait, stairs, and other functional mobility.     Baseline  1/27: 56sec LLE    Time  6    Period  Weeks    Status  Achieved  PT LONG TERM GOAL #5   Title  Pt will be able to ambulate at least 489f during 3MWT with LRAD, 2/10 L knee pain, and gait WFL in order to demo improved tolerance to functional mobility and maximize her HH ambulation.    Baseline  04/08/18: Less than 2/10 pain with ambulation 620 feet    Time  6    Period  Weeks    Status  Achieved            Plan - 04/08/18 1414    Clinical Impression Statement  Performed re-assessment this session to check patient's progress towards goals. Patient achieved 2 out of 3 short term goals. Patient achieved 2 out of 5 long term goals. Patient has demonstrated progress since the last formal re-assessment in knee range of motion, strength, and functional mobility, however patient continues to have deficits in these areas. Discussed with patient and patient's husband that therapist feels patient would benefit from continuing physical therapy until there is a plateau in her progress to focus at this time on left knee flexion range of motion as well as more on strengthening. Patient and patient's husband gave consent to this plan. The remainder of the session patient performed range of motion exercises and therapist educated about an updated HEP.     Rehab Potential  Fair    PT Frequency  2x / week    PT Duration  3 weeks    PT Treatment/Interventions  ADLs/Self Care Home Management;Aquatic Therapy;Cryotherapy;Electrical Stimulation;Moist Heat;Gait training;DME Instruction;Stair training;Functional mobility training;Therapeutic  activities;Therapeutic exercise;Balance training;Neuromuscular re-education;Patient/family education;Manual techniques;Compression bandaging;Scar mobilization;Orthotic Fit/Training;Passive range of motion;Dry needling;Energy conservation;Taping    PT Next Visit Plan  Consider adding seated contract/relax for flexion. Focus on left knee flexion ROM until patient plateaus and begin hip/knee and functional strengthening.     PT Home Exercise Plan  eval: quad set, supine heel slide, seated HS stretch, seated knee flexion, hooklying SKTC (husband to assist)       Patient will benefit from skilled therapeutic intervention in order to improve the following deficits and impairments:  Abnormal gait, Decreased activity tolerance, Decreased balance, Decreased endurance, Decreased mobility, Decreased range of motion, Decreased scar mobility, Decreased strength, Difficulty walking, Hypomobility, Increased edema, Increased fascial restricitons, Increased muscle spasms, Impaired flexibility, Improper body mechanics, Postural dysfunction, Obesity, Pain  Visit Diagnosis: Acute pain of left knee  Stiffness of left knee, not elsewhere classified  Muscle weakness (generalized)  Localized edema     Problem List Patient Active Problem List   Diagnosis Date Noted  . Depression, major, single episode, mild ([facility] 03/07/2018  . OA (osteoarthritis) of knee 02/07/2018  . Other allergic contact dermatitis 12/20/2017  . Non-allergic rhinitis 12/16/2017  . Allergy to metal 12/16/2017  . Gastroesophageal reflux disease without esophagitis 07/15/2017  . Primary osteoarthritis of both feet 04/29/2017  . Family history of early CAD 04/15/2016  . Primary osteoarthritis of both knees 12/31/2015  . Chest pain 07/13/2015  . Atypical chest pain 07/13/2015  . Major depression 03/19/2015  . Vulvodynia 08/03/2014  . Functional constipation 08/03/2014  . Anxiety 07/06/2014  . Fibromyalgia 04/25/2014  . Snoring 04/22/2014   . Prediabetes 03/18/2014  . Hyperlipidemia 03/18/2014  . Osteopenia 03/06/2014  . History of cardiac catheterization 04/28/2013  . Dyspepsia 02/02/2012  . GERD (gastroesophageal reflux disease) 02/02/2012  . Depression 06/18/2011  . Dysphagia 02/05/2011  . CHEST PAIN 02/11/2009  . GASTROESOPHAGEAL REFLUX DISEASE, CHRONIC 02/15/2007  . IRRITABLE BOWEL SYNDROME 02/15/2007  . Primary osteoarthritis of  both hands 02/15/2007  . Reflux esophagitis 02/14/2007  . GASTRITIS 02/14/2007   Clarene Critchley PT, DPT 2:19 PM, 04/08/18 [place], [place], [address] Phone: [phone]   Fax:  [phone]  Name: BRILEIGH SEVCIK MRN: [id number] Date of Birth: 11-24-1940

## 2018-04-08 NOTE — Addendum Note (Signed)
Addended by: Clarene Critchley on: 04/08/2018 02:21 PM   Modules accepted: Orders

## 2018-04-12 ENCOUNTER — Ambulatory Visit (HOSPITAL_COMMUNITY): Payer: PPO | Admitting: Physical Therapy

## 2018-04-12 ENCOUNTER — Encounter (HOSPITAL_COMMUNITY): Payer: Self-pay | Admitting: Physical Therapy

## 2018-04-12 DIAGNOSIS — R600 Localized edema: Secondary | ICD-10-CM

## 2018-04-12 DIAGNOSIS — M25562 Pain in left knee: Secondary | ICD-10-CM | POA: Diagnosis not present

## 2018-04-12 DIAGNOSIS — M25662 Stiffness of left knee, not elsewhere classified: Secondary | ICD-10-CM

## 2018-04-12 DIAGNOSIS — M6281 Muscle weakness (generalized): Secondary | ICD-10-CM

## 2018-04-12 NOTE — Therapy (Signed)
[place], [place], [address] Phone: [phone]   Fax:  [phone]  Physical Therapy Treatment  Patient Details  Name: Ruth [name] MRN: [id number] Date of Birth: [birth year] Referring Provider (PT): Theresa Duty, [place]   Encounter Date: 04/12/2018  PT End of Session - 04/12/18 1207    Visit Number  12    Number of Visits  19    Date for PT Re-Evaluation  04/29/18    Authorization Type  [employer]    Authorization Time Period  02/22/18 to 04/05/18; New: 04/08/18 - 04/29/18    Authorization - Visit Number  12    Authorization - Number of Visits  19    PT Start Time  4235    PT Stop Time  3614    PT Time Calculation (min)  46 min    Activity Tolerance  Patient tolerated treatment well;No increased pain    Behavior During Therapy  WFL for tasks assessed/performed       Past Medical History:  Diagnosis Date  . Anxiety   . Arthritis   . Asthma   . Depression   . Eczema   . Fibromyalgia   . GERD (gastroesophageal reflux disease)    EGD/ colon 1/09  . H pylori ulcer 1980-1990   s/p treatment  . Headache   . Hyperlipidemia   . IBS (irritable bowel syndrome)   . Laryngospasm   . Neck pain   . OSA (obstructive sleep apnea)   . PONV (postoperative nausea and vomiting)   . Refusal of blood transfusions as patient is Jehovah's Witness   . Vertigo     Past Surgical History:  Procedure Laterality Date  . ABDOMINAL HYSTERECTOMY  1970  . APPENDECTOMY    . BILATERAL SALPINGOOPHORECTOMY  1990s  . CARDIAC CATHETERIZATION  2002   [facility]) normal coronary arteries   . COLONOSCOPY  01/2007   Dr. Meriel Flavors  . COLONOSCOPY N/A 04/18/2015   Procedure: COLONOSCOPY;  Surgeon: Rogene Houston, MD;  [facility];  Service: Endoscopy;  Laterality: N/A;  830 - moved to 8:55 - Ann to notify pt  . COLONOSCOPY WITH ESOPHAGOGASTRODUODENOSCOPY (EGD)  02/16/2012   Procedure: COLONOSCOPY WITH  ESOPHAGOGASTRODUODENOSCOPY (EGD);  Surgeon: Daneil Dolin, MD;  [facility];  Service: Endoscopy;  Laterality: N/A;  8:45  . ESOPHAGEAL DILATION N/A 06/07/2014   Procedure: ESOPHAGEAL DILATION;  Surgeon: Rogene Houston, MD;  [facility];  Service: Endoscopy;  Laterality: N/A;  . ESOPHAGOGASTRODUODENOSCOPY  01/2007   Dr Sharlett Iles- 3 cm hiatal hernia, benign esophageal biopsies, erosive esophagitis, gastritis  . ESOPHAGOGASTRODUODENOSCOPY N/A 06/07/2014   Procedure: ESOPHAGOGASTRODUODENOSCOPY (EGD);  Surgeon: Rogene Houston, MD;  [facility];  Service: Endoscopy;  Laterality: N/A;  1200  . ESOPHAGOGASTRODUODENOSCOPY N/A 08/18/2017   Procedure: ESOPHAGOGASTRODUODENOSCOPY (EGD);  Surgeon: Rogene Houston, MD;  [facility];  Service: Endoscopy;  Laterality: N/A;  2:20  . FOOT SURGERY    . INNER EAR SURGERY     Left  . KNEE SURGERY     Multiple  . SHOULDER SURGERY  2011  . TONSILLECTOMY    . TOTAL KNEE ARTHROPLASTY Left 02/07/2018   Procedure: TOTAL KNEE ARTHROPLASTY;  Surgeon: Gaynelle Arabian, MD;  [facility];  Service: Orthopedics;  Laterality: Left;  72mn  . TUBAL LIGATION    . TYMPANOSTOMY TUBE PLACEMENT      There were no vitals filed for this  visit.  Subjective Assessment - 04/12/18 1127    Subjective  PT states that her pain is about a 2/10 she is doing her exercises twice a day.  She is still having difficulty with her overall strength and her motion     Patient Stated Goals  be able to function and stand and walk, potentially squat    Currently in Pain?  Yes    Pain Score  2     Pain Location  Knee    Pain Orientation  Left    Pain Descriptors / Indicators  Aching    Pain Type  Acute pain    Pain Onset  More than a month ago    Pain Frequency  Intermittent    Aggravating Factors   activity    Pain Relieving Factors  rest                       [facility] Adult PT Treatment/Exercise - 04/12/18 0001       Exercises   Exercises  Knee/Hip      Knee/Hip Exercises: Stretches   Active Hamstring Stretch  Left;3 reps;20 seconds      Knee/Hip Exercises: Seated   Long Arc Quad  Left;10 reps    Heel Slides  Left;10 reps    Other Seated Knee/Hip Exercises  contract relax x 5 x 3       Knee/Hip Exercises: Supine   Quad Sets  10 reps    Short Arc Quad Sets  Left;15 reps    Heel Slides  Left;10 reps    Bridges  10 reps    Knee Extension  AROM    Knee Extension Limitations  4    Knee Flexion  AAROM    Knee Flexion Limitations  82   AROM 79     Manual Therapy   Manual Therapy  Manual Lymphatic Drainage (MLD)    Manual therapy comments  Manual complete separate from rest of tx    Edema Management  manual lymph drainag     Joint Mobilization  Patella mobs all direction for ROM               PT Short Term Goals - 04/08/18 1407      PT SHORT TERM GOAL #1   Title  Pt will be independent with and consistently perform HEP to improve ROM and reduce pain.     Time  3    Period  Weeks    Status  Achieved      PT SHORT TERM GOAL #2   Title  Pt will have reduced edema by 2cm or > at joint line in order to maximize ROM and reduce pain.     Baseline  04/08/18: see objective measures    Time  3    Period  Weeks    Status  Partially Met      PT SHORT TERM GOAL #3   Title  Pt will have improved L knee AROM from 5 to 75 deg in order to reduce pain and maximize gait.     Baseline  04/08/18: 4-79 degrees     Time  3    Period  Weeks    Status  Achieved        PT Long Term Goals - 04/08/18 1408      PT LONG TERM GOAL #1   Title  Pt will have improved L knee AROM from 0 to 110deg in order to further  reduce pain and maximize stair ambulation.     Baseline  04/08/18: 4-79 degrees    Time  6    Period  Weeks    Status  On-going      PT LONG TERM GOAL #2   Title  Pt will have improved MMT to 4/5 or > throughout mm groups tested in order to maximize gait and functional mobility.      Baseline  04/08/18: see MMT    Time  6    Period  Weeks    Status  Partially Met      PT LONG TERM GOAL #3   Title  Pt will be able to perform 12STS during 30sec chair rise test without UE support and proper form in order to demo improved balance and functional strength.     Baseline  04/08/18: 9x, no UE support    Time  6    Period  Weeks    Status  On-going      PT LONG TERM GOAL #4   Title  Pt will be able to perform L SLS for 10sec or > to demo improved balance, core and functional strength in order to maximize gait, stairs, and other functional mobility.     Baseline  1/27: 56sec LLE    Time  6    Period  Weeks    Status  Achieved      PT LONG TERM GOAL #5   Title  Pt will be able to ambulate at least 448f during 3MWT with LRAD, 2/10 L knee pain, and gait WFL in order to demo improved tolerance to functional mobility and maximize her HH ambulation.    Baseline  04/08/18: Less than 2/10 pain with ambulation 620 feet    Time  6    Period  Weeks    Status  Achieved            Plan - 04/12/18 1209    Clinical Impression Statement  Pt treatment continues to focus on ROM.  Pt is now at 815with AA, 79 actively.  Therapist explained what a JAS brace was and the possibility of it assisting with her ROM.  Pt is willing to try therefore a prescription was sent to Dr. AElmyra Ricks      Rehab Potential  Fair    PT Frequency  2x / week    PT Duration  3 weeks    PT Treatment/Interventions  ADLs/Self Care Home Management;Aquatic Therapy;Cryotherapy;Electrical Stimulation;Moist Heat;Gait training;DME Instruction;Stair training;Functional mobility training;Therapeutic activities;Therapeutic exercise;Balance training;Neuromuscular re-education;Patient/family education;Manual techniques;Compression bandaging;Scar mobilization;Orthotic Fit/Training;Passive range of motion;Dry needling;Energy conservation;Taping    PT Next Visit Plan  . Focus on left knee flexion ROM until patient plateaus and begin  hip/knee and functional strengthening.     PT Home Exercise Plan  eval: quad set, supine heel slide, seated HS stretch, seated knee flexion, hooklying SKTC (husband to assist)       Patient will benefit from skilled therapeutic intervention in order to improve the following deficits and impairments:  Abnormal gait, Decreased activity tolerance, Decreased balance, Decreased endurance, Decreased mobility, Decreased range of motion, Decreased scar mobility, Decreased strength, Difficulty walking, Hypomobility, Increased edema, Increased fascial restricitons, Increased muscle spasms, Impaired flexibility, Improper body mechanics, Postural dysfunction, Obesity, Pain  Visit Diagnosis: Acute pain of left knee  Stiffness of left knee, not elsewhere classified  Muscle weakness (generalized)  Localized edema     Problem List Patient Active Problem List   Diagnosis Date Noted  .  Depression, major, single episode, mild ([place]) 03/07/2018  . OA (osteoarthritis) of knee 02/07/2018  . Other allergic contact dermatitis 12/20/2017  . Non-allergic rhinitis 12/16/2017  . Allergy to metal 12/16/2017  . Gastroesophageal reflux disease without esophagitis 07/15/2017  . Primary osteoarthritis of both feet 04/29/2017  . Family history of early CAD 04/15/2016  . Primary osteoarthritis of both knees 12/31/2015  . Chest pain 07/13/2015  . Atypical chest pain 07/13/2015  . Major depression 03/19/2015  . Vulvodynia 08/03/2014  . Functional constipation 08/03/2014  . Anxiety 07/06/2014  . Fibromyalgia 04/25/2014  . Snoring 04/22/2014  . Prediabetes 03/18/2014  . Hyperlipidemia 03/18/2014  . Osteopenia 03/06/2014  . History of cardiac catheterization 04/28/2013  . Dyspepsia 02/02/2012  . GERD (gastroesophageal reflux disease) 02/02/2012  . Depression 06/18/2011  . Dysphagia 02/05/2011  . CHEST PAIN 02/11/2009  . GASTROESOPHAGEAL REFLUX DISEASE, CHRONIC 02/15/2007  . IRRITABLE BOWEL SYNDROME  02/15/2007  . Primary osteoarthritis of both hands 02/15/2007  . Reflux esophagitis 02/14/2007  . GASTRITIS 02/14/2007    Rayetta Humphrey, PT CLT [id number] 04/12/2018, 12:12 PM  [facility] [address] [place], [place], [address] Phone: [phone]   Fax:  [phone]  Name: Ruth [name] MRN: [id number] Date of Birth: 07-24-1940

## 2018-04-14 ENCOUNTER — Ambulatory Visit (HOSPITAL_COMMUNITY): Payer: PPO

## 2018-04-14 ENCOUNTER — Encounter (HOSPITAL_COMMUNITY): Payer: Self-pay

## 2018-04-14 DIAGNOSIS — M25562 Pain in left knee: Secondary | ICD-10-CM

## 2018-04-14 DIAGNOSIS — M6281 Muscle weakness (generalized): Secondary | ICD-10-CM

## 2018-04-14 DIAGNOSIS — M25662 Stiffness of left knee, not elsewhere classified: Secondary | ICD-10-CM

## 2018-04-14 DIAGNOSIS — R600 Localized edema: Secondary | ICD-10-CM

## 2018-04-14 NOTE — Patient Instructions (Signed)
Bridge    Lie back, legs bent, place ball/towel between knees. Squeeze object between knees then press hips up. Keeping ribs in, lengthen lower back. Exhale, rolling down along spine from top. Repeat 10 times. Do 2 sessions per day.  http://pm.exer.us/55   Copyright  V[employer]. All rights reserved.

## 2018-04-14 NOTE — Therapy (Signed)
[place], [place], [address] Phone: [phone]   Fax:  [phone]  Physical Therapy Treatment  Patient Details  Name: Ruth [name] MRN: [id number] Date of Birth: [birth year] Referring Provider (PT): Theresa Duty, [place]   Encounter Date: 04/14/2018  PT End of Session - 04/14/18 1453    Visit Number  13    Number of Visits  19    Date for PT Re-Evaluation  04/29/18    Authorization Type  [employer]    Authorization Time Period  02/22/18 to 04/05/18; New: 04/08/18 - 04/29/18    Authorization - Visit Number  13    Authorization - Number of Visits  19    PT Start Time  1300    PT Stop Time  1342    PT Time Calculation (min)  42 min    Activity Tolerance  Patient tolerated treatment well;No increased pain    Behavior During Therapy  WFL for tasks assessed/performed       Past Medical History:  Diagnosis Date  . Anxiety   . Arthritis   . Asthma   . Depression   . Eczema   . Fibromyalgia   . GERD (gastroesophageal reflux disease)    EGD/ colon 1/09  . H pylori ulcer 1980-1990   s/p treatment  . Headache   . Hyperlipidemia   . IBS (irritable bowel syndrome)   . Laryngospasm   . Neck pain   . OSA (obstructive sleep apnea)   . PONV (postoperative nausea and vomiting)   . Refusal of blood transfusions as patient is Jehovah's Witness   . Vertigo     Past Surgical History:  Procedure Laterality Date  . ABDOMINAL HYSTERECTOMY  1970  . APPENDECTOMY    . BILATERAL SALPINGOOPHORECTOMY  1990s  . CARDIAC CATHETERIZATION  2002   [facility]) normal coronary arteries   . COLONOSCOPY  01/2007   Dr. Meriel Flavors  . COLONOSCOPY N/A 04/18/2015   Procedure: COLONOSCOPY;  Surgeon: Rogene Houston, MD;  [facility];  Service: Endoscopy;  Laterality: N/A;  830 - moved to 8:55 - Ann to notify pt  . COLONOSCOPY WITH ESOPHAGOGASTRODUODENOSCOPY (EGD)  02/16/2012   Procedure: COLONOSCOPY WITH  ESOPHAGOGASTRODUODENOSCOPY (EGD);  Surgeon: Daneil Dolin, MD;  [facility];  Service: Endoscopy;  Laterality: N/A;  8:45  . ESOPHAGEAL DILATION N/A 06/07/2014   Procedure: ESOPHAGEAL DILATION;  Surgeon: Rogene Houston, MD;  [facility];  Service: Endoscopy;  Laterality: N/A;  . ESOPHAGOGASTRODUODENOSCOPY  01/2007   Dr Sharlett Iles- 3 cm hiatal hernia, benign esophageal biopsies, erosive esophagitis, gastritis  . ESOPHAGOGASTRODUODENOSCOPY N/A 06/07/2014   Procedure: ESOPHAGOGASTRODUODENOSCOPY (EGD);  Surgeon: Rogene Houston, MD;  [facility];  Service: Endoscopy;  Laterality: N/A;  1200  . ESOPHAGOGASTRODUODENOSCOPY N/A 08/18/2017   Procedure: ESOPHAGOGASTRODUODENOSCOPY (EGD);  Surgeon: Rogene Houston, MD;  [facility];  Service: Endoscopy;  Laterality: N/A;  2:20  . FOOT SURGERY    . INNER EAR SURGERY     Left  . KNEE SURGERY     Multiple  . SHOULDER SURGERY  2011  . TONSILLECTOMY    . TOTAL KNEE ARTHROPLASTY Left 02/07/2018   Procedure: TOTAL KNEE ARTHROPLASTY;  Surgeon: Gaynelle Arabian, MD;  [facility];  Service: Orthopedics;  Laterality: Left;  53mn  . TUBAL LIGATION    . TYMPANOSTOMY TUBE PLACEMENT      There were no vitals filed for this  visit.  Subjective Assessment - 04/14/18 1307    Subjective  Pt stated increased pain following last session, current pain scale 5/10 today Lt knee.  Reports she received the compression hose and wore the last 2 days, reports difficulty putting dressings on wiht husband assistance    Patient Stated Goals  be able to function and stand and walk, potentially squat    Currently in Pain?  Yes    Pain Score  5     Pain Location  Knee    Pain Orientation  Left    Pain Descriptors / Indicators  Aching;Sore;Tightness    Pain Type  Acute pain    Pain Onset  More than a month ago    Pain Frequency  Intermittent    Aggravating Factors   activity    Pain Relieving Factors  rest    Effect of Pain on  Daily Activities  increases                       [facility] Adult PT Treatment/Exercise - 04/14/18 0001      Knee/Hip Exercises: Stretches   Knee: Self-Stretch to increase Flexion  Left    Knee: Self-Stretch Limitations  10x10" holds 8" step      Knee/Hip Exercises: Seated   Long Arc Quad  Left;10 reps    Heel Slides  Left;10 reps    Other Seated Knee/Hip Exercises  contract relax x 5 x 3       Knee/Hip Exercises: Supine   Heel Slides  Left;10 reps    Bridges  10 reps    Bridges Limitations  towel between knees    Knee Extension  AROM    Knee Extension Limitations  4    Knee Flexion  AROM    Knee Flexion Limitations  84   was 82     Manual Therapy   Manual Therapy  Edema management    Manual therapy comments  Manual complete separate from rest of tx    Edema Management  Retrograde massage with LE elevated             PT Education - 04/14/18 1332    Education Details  Discussed benefits with JAS brace and measurements taken, awaiting signed referral    Person(s) Educated  Patient    Methods  Explanation    Comprehension  Verbalized understanding       PT Short Term Goals - 04/08/18 1407      PT SHORT TERM GOAL #1   Title  Pt will be independent with and consistently perform HEP to improve ROM and reduce pain.     Time  3    Period  Weeks    Status  Achieved      PT SHORT TERM GOAL #2   Title  Pt will have reduced edema by 2cm or > at joint line in order to maximize ROM and reduce pain.     Baseline  04/08/18: see objective measures    Time  3    Period  Weeks    Status  Partially Met      PT SHORT TERM GOAL #3   Title  Pt will have improved L knee AROM from 5 to 75 deg in order to reduce pain and maximize gait.     Baseline  04/08/18: 4-79 degrees     Time  3    Period  Weeks    Status  Achieved  PT Long Term Goals - 04/08/18 1408      PT LONG TERM GOAL #1   Title  Pt will have improved L knee AROM from 0 to 110deg in order to  further reduce pain and maximize stair ambulation.     Baseline  04/08/18: 4-79 degrees    Time  6    Period  Weeks    Status  On-going      PT LONG TERM GOAL #2   Title  Pt will have improved MMT to 4/5 or > throughout mm groups tested in order to maximize gait and functional mobility.     Baseline  04/08/18: see MMT    Time  6    Period  Weeks    Status  Partially Met      PT LONG TERM GOAL #3   Title  Pt will be able to perform 12STS during 30sec chair rise test without UE support and proper form in order to demo improved balance and functional strength.     Baseline  04/08/18: 9x, no UE support    Time  6    Period  Weeks    Status  On-going      PT LONG TERM GOAL #4   Title  Pt will be able to perform L SLS for 10sec or > to demo improved balance, core and functional strength in order to maximize gait, stairs, and other functional mobility.     Baseline  1/27: 56sec LLE    Time  6    Period  Weeks    Status  Achieved      PT LONG TERM GOAL #5   Title  Pt will be able to ambulate at least 416f during 3MWT with LRAD, 2/10 L knee pain, and gait WFL in order to demo improved tolerance to functional mobility and maximize her HH ambulation.    Baseline  04/08/18: Less than 2/10 pain with ambulation 620 feet    Time  6    Period  Weeks    Status  Achieved            Plan - 04/14/18 1453    Clinical Impression Statement  Continued session focus with knee mobility.  Increased focus wiht manual techqniues for edema and pain control.  Signed referral not received, did go ahead and complete measurements for JAS brace to reduce wait time to assist with flexion.  Improved to AAROM 84 degrees (was 82 last session).    Rehab Potential  Fair    PT Frequency  2x / week    PT Duration  3 weeks    PT Treatment/Interventions  ADLs/Self Care Home Management;Aquatic Therapy;Cryotherapy;Electrical Stimulation;Moist Heat;Gait training;DME Instruction;Stair training;Functional mobility  training;Therapeutic activities;Therapeutic exercise;Balance training;Neuromuscular re-education;Patient/family education;Manual techniques;Compression bandaging;Scar mobilization;Orthotic Fit/Training;Passive range of motion;Dry needling;Energy conservation;Taping    PT Next Visit Plan  Focus on left knee flexion ROM until patient plateaus and begin hip/knee and functional strengthening.     PT Home Exercise Plan  eval: quad set, supine heel slide, seated HS stretch, seated knee flexion, hooklying SKTC (husband to assist)       Patient will benefit from skilled therapeutic intervention in order to improve the following deficits and impairments:  Abnormal gait, Decreased activity tolerance, Decreased balance, Decreased endurance, Decreased mobility, Decreased range of motion, Decreased scar mobility, Decreased strength, Difficulty walking, Hypomobility, Increased edema, Increased fascial restricitons, Increased muscle spasms, Impaired flexibility, Improper body mechanics, Postural dysfunction, Obesity, Pain  Visit Diagnosis: Stiffness of left  knee, not elsewhere classified  Muscle weakness (generalized)  Localized edema  Acute pain of left knee     Problem List Patient Active Problem List   Diagnosis Date Noted  . Depression, major, single episode, mild ([place]) 03/07/2018  . OA (osteoarthritis) of knee 02/07/2018  . Other allergic contact dermatitis 12/20/2017  . Non-allergic rhinitis 12/16/2017  . Allergy to metal 12/16/2017  . Gastroesophageal reflux disease without esophagitis 07/15/2017  . Primary osteoarthritis of both feet 04/29/2017  . Family history of early CAD 04/15/2016  . Primary osteoarthritis of both knees 12/31/2015  . Chest pain 07/13/2015  . Atypical chest pain 07/13/2015  . Major depression 03/19/2015  . Vulvodynia 08/03/2014  . Functional constipation 08/03/2014  . Anxiety 07/06/2014  . Fibromyalgia 04/25/2014  . Snoring 04/22/2014  . Prediabetes 03/18/2014   . Hyperlipidemia 03/18/2014  . Osteopenia 03/06/2014  . History of cardiac catheterization 04/28/2013  . Dyspepsia 02/02/2012  . GERD (gastroesophageal reflux disease) 02/02/2012  . Depression 06/18/2011  . Dysphagia 02/05/2011  . CHEST PAIN 02/11/2009  . GASTROESOPHAGEAL REFLUX DISEASE, CHRONIC 02/15/2007  . IRRITABLE BOWEL SYNDROME 02/15/2007  . Primary osteoarthritis of both hands 02/15/2007  . Reflux esophagitis 02/14/2007  . GASTRITIS 02/14/2007   Ruth [name], [place]; [place]  Ruth [name] 04/14/2018, 2:58 PM  [facility] [address] [place], [place], [address] Phone: [phone]   Fax:  [phone]  Name: Ruth [name] MRN: [id number] Date of Birth: 10-28-1940

## 2018-04-20 ENCOUNTER — Ambulatory Visit (INDEPENDENT_AMBULATORY_CARE_PROVIDER_SITE_OTHER): Payer: PPO | Admitting: Family Medicine

## 2018-04-20 ENCOUNTER — Encounter: Payer: Self-pay | Admitting: Family Medicine

## 2018-04-20 ENCOUNTER — Encounter (HOSPITAL_COMMUNITY): Payer: Self-pay

## 2018-04-20 ENCOUNTER — Ambulatory Visit (HOSPITAL_COMMUNITY): Payer: PPO | Attending: Student

## 2018-04-20 VITALS — BP 120/70 | Ht 65.0 in | Wt 206.0 lb

## 2018-04-20 DIAGNOSIS — M25562 Pain in left knee: Secondary | ICD-10-CM | POA: Insufficient documentation

## 2018-04-20 DIAGNOSIS — R251 Tremor, unspecified: Secondary | ICD-10-CM

## 2018-04-20 DIAGNOSIS — M25662 Stiffness of left knee, not elsewhere classified: Secondary | ICD-10-CM | POA: Insufficient documentation

## 2018-04-20 DIAGNOSIS — M6281 Muscle weakness (generalized): Secondary | ICD-10-CM | POA: Insufficient documentation

## 2018-04-20 DIAGNOSIS — F320 Major depressive disorder, single episode, mild: Secondary | ICD-10-CM | POA: Diagnosis not present

## 2018-04-20 DIAGNOSIS — R600 Localized edema: Secondary | ICD-10-CM | POA: Insufficient documentation

## 2018-04-20 MED ORDER — ESCITALOPRAM OXALATE 20 MG PO TABS: 20.0000 mg | ORAL_TABLET | Freq: Every day | ORAL | 3 refills | Status: DC

## 2018-04-20 NOTE — Progress Notes (Signed)
   Subjective:    [name], female    DOB: Sep 19, 1940, 78 y.o.   MRN: [id number]  HPI  Patient is here today to discuss thyroid issues and does not think her anti depressant is working. She is wondering whether the thyroid could be causing the anxiety.  She states she has had some loose stool she thinks from the nervousness.  She is on Lexapro 10 mg per day and says it has helped some but still not getting the benefits she expected.  She is considering going back to prozac which it messes with her stomach.  She also reports her heart rate yesterday 102 after drinking [employer] bp 147/76.  Review of Systems  Constitutional: Negative for activity change, fatigue and fever.  HENT: Negative for congestion and rhinorrhea.   Respiratory: Negative for cough, chest tightness and shortness of breath.   Cardiovascular: Negative for chest pain and leg swelling.  Gastrointestinal: Negative for abdominal pain and nausea.  Skin: Negative for color change.  Neurological: Negative for dizziness and headaches.  Psychiatric/Behavioral: Negative for agitation and behavioral problems.       Objective:   Physical Exam Vitals signs reviewed.  Constitutional:      Appearance: She is well-developed.  HENT:     Head: Normocephalic.  Cardiovascular:     Rate and Rhythm: Normal rate and regular rhythm.     Heart sounds: Normal heart sounds. No murmur.  Pulmonary:     Effort: Pulmonary effort is normal.     Breath sounds: Normal breath sounds.  Skin:    General: Skin is warm and dry.  Neurological:     Mental Status: She is alert.       Patient not suicidal    Assessment & Plan:  Patient concerned about a possible her thyroid because she is having some tremors and anxiousness we will check a thyroid function  Also had recent low calcium we will check the lab work await the results  Depression doing fair with the medication she is willing to go up on the dose 20 mg  daily  Follow-up in 4 months sooner problems give [place] update in a few weeks time how the depression is doing

## 2018-04-20 NOTE — Therapy (Signed)
[facility] [place], [place], [address] Phone: [phone]   Fax:  [phone]  Physical Therapy Treatment  Patient Details  Name: Ruth [name] MRN: [id number] Date of Birth: Aug 19, 1940 Referring Provider (PT): Theresa Duty, [place]   Encounter Date: 04/20/2018  PT End of Session - 04/20/18 1257    Visit Number  14    Number of Visits  19    Date for PT Re-Evaluation  04/29/18    Authorization Type  [employer]    Authorization Time Period  02/22/18 to 04/05/18; New: 04/08/18 - 04/29/18    Authorization - Visit Number  14    Authorization - Number of Visits  19    PT Start Time  1324    PT Stop Time  1342    PT Time Calculation (min)  44 min    Activity Tolerance  Patient tolerated treatment well;No increased pain    Behavior During Therapy  WFL for tasks assessed/performed       Past Medical History:  Diagnosis Date  . Anxiety   . Arthritis   . Asthma   . Depression   . Eczema   . Fibromyalgia   . GERD (gastroesophageal reflux disease)    EGD/ colon 1/09  . H pylori ulcer 1980-1990   s/p treatment  . Headache   . Hyperlipidemia   . IBS (irritable bowel syndrome)   . Laryngospasm   . Neck pain   . OSA (obstructive sleep apnea)   . PONV (postoperative nausea and vomiting)   . Refusal of blood transfusions as patient is Jehovah's Witness   . Vertigo     Past Surgical History:  Procedure Laterality Date  . ABDOMINAL HYSTERECTOMY  1970  . APPENDECTOMY    . BILATERAL SALPINGOOPHORECTOMY  1990s  . CARDIAC CATHETERIZATION  2002   [facility]) normal coronary arteries   . COLONOSCOPY  01/2007   Dr. Meriel Flavors  . COLONOSCOPY N/A 04/18/2015   Procedure: COLONOSCOPY;  Surgeon: Rogene Houston, MD;  [facility];  Service: Endoscopy;  Laterality: N/A;  830 - moved to 8:55 - Ann to notify pt  . COLONOSCOPY WITH ESOPHAGOGASTRODUODENOSCOPY (EGD)  02/16/2012   Procedure: COLONOSCOPY WITH  ESOPHAGOGASTRODUODENOSCOPY (EGD);  Surgeon: Daneil Dolin, MD;  [facility];  Service: Endoscopy;  Laterality: N/A;  8:45  . ESOPHAGEAL DILATION N/A 06/07/2014   Procedure: ESOPHAGEAL DILATION;  Surgeon: Rogene Houston, MD;  [facility];  Service: Endoscopy;  Laterality: N/A;  . ESOPHAGOGASTRODUODENOSCOPY  01/2007   Dr Sharlett Iles- 3 cm hiatal hernia, benign esophageal biopsies, erosive esophagitis, gastritis  . ESOPHAGOGASTRODUODENOSCOPY N/A 06/07/2014   Procedure: ESOPHAGOGASTRODUODENOSCOPY (EGD);  Surgeon: Rogene Houston, MD;  [facility];  Service: Endoscopy;  Laterality: N/A;  1200  . ESOPHAGOGASTRODUODENOSCOPY N/A 08/18/2017   Procedure: ESOPHAGOGASTRODUODENOSCOPY (EGD);  Surgeon: Rogene Houston, MD;  [facility];  Service: Endoscopy;  Laterality: N/A;  2:20  . FOOT SURGERY    . INNER EAR SURGERY     Left  . KNEE SURGERY     Multiple  . SHOULDER SURGERY  2011  . TONSILLECTOMY    . TOTAL KNEE ARTHROPLASTY Left 02/07/2018   Procedure: TOTAL KNEE ARTHROPLASTY;  Surgeon: Gaynelle Arabian, MD;  [facility];  Service: Orthopedics;  Laterality: Left;  25mn  . TUBAL LIGATION    . TYMPANOSTOMY TUBE PLACEMENT      There were no vitals filed for this  visit.  Subjective Assessment - 04/20/18 1301    Subjective  Pt states that she doesn't feel good today. She doesn't know what's wrong. Also states that her knee is hurting pretty bad today.     Patient Stated Goals  be able to function and stand and walk, potentially squat    Currently in Pain?  Yes    Pain Score  6     Pain Location  Knee    Pain Orientation  Left    Pain Descriptors / Indicators  Aching    Pain Type  Chronic pain    Pain Onset  More than a month ago    Pain Frequency  Intermittent    Aggravating Factors   activity    Pain Relieving Factors  rest    Effect of Pain on Daily Activities  increases            [facility] Adult PT Treatment/Exercise - 04/20/18 0001       Knee/Hip Exercises: Stretches   Active Hamstring Stretch  Left;3 reps;30 seconds    Active Hamstring Stretch Limitations  standing, 8" step    Knee: Self-Stretch to increase Flexion  Left    Knee: Self-Stretch Limitations  10x10" holds 8" step    Gastroc Stretch  Both;3 reps;30 seconds    Gastroc Stretch Limitations  slant board      Knee/Hip Exercises: Aerobic   Nustep  x5 mins, L1 for functional knee flexion ROM      Knee/Hip Exercises: Machines for Strengthening   Other Machine  Biodex PROM: flex to 76%, ext to 89% (started her at ~65% for each and steadily increased)      Knee/Hip Exercises: Seated   Heel Slides  Left;10 reps    Heel Slides Limitations  x5" sec    Stool Scoot - Round Trips  fwd in rollator LLE only 42f x2RT for knee flexion    Other Seated Knee/Hip Exercises  contract relax 3x10" holds x2RT      Knee/Hip Exercises: Supine   Knee Extension Limitations  4    Knee Flexion Limitations  83             PT Short Term Goals - 04/08/18 1407      PT SHORT TERM GOAL #1   Title  Pt will be independent with and consistently perform HEP to improve ROM and reduce pain.     Time  3    Period  Weeks    Status  Achieved      PT SHORT TERM GOAL #2   Title  Pt will have reduced edema by 2cm or > at joint line in order to maximize ROM and reduce pain.     Baseline  04/08/18: see objective measures    Time  3    Period  Weeks    Status  Partially Met      PT SHORT TERM GOAL #3   Title  Pt will have improved L knee AROM from 5 to 75 deg in order to reduce pain and maximize gait.     Baseline  04/08/18: 4-79 degrees     Time  3    Period  Weeks    Status  Achieved        PT Long Term Goals - 04/08/18 1408      PT LONG TERM GOAL #1   Title  Pt will have improved L knee AROM from 0 to 110deg in order to further reduce pain and  maximize stair ambulation.     Baseline  04/08/18: 4-79 degrees    Time  6    Period  Weeks    Status  On-going      PT LONG TERM  GOAL #2   Title  Pt will have improved MMT to 4/5 or > throughout mm groups tested in order to maximize gait and functional mobility.     Baseline  04/08/18: see MMT    Time  6    Period  Weeks    Status  Partially Met      PT LONG TERM GOAL #3   Title  Pt will be able to perform 12STS during 30sec chair rise test without UE support and proper form in order to demo improved balance and functional strength.     Baseline  04/08/18: 9x, no UE support    Time  6    Period  Weeks    Status  On-going      PT LONG TERM GOAL #4   Title  Pt will be able to perform L SLS for 10sec or > to demo improved balance, core and functional strength in order to maximize gait, stairs, and other functional mobility.     Baseline  1/27: 56sec LLE    Time  6    Period  Weeks    Status  Achieved      PT LONG TERM GOAL #5   Title  Pt will be able to ambulate at least 461f during 3MWT with LRAD, 2/10 L knee pain, and gait WFL in order to demo improved tolerance to functional mobility and maximize her HH ambulation.    Baseline  04/08/18: Less than 2/10 pain with ambulation 620 feet    Time  6    Period  Weeks    Status  Achieved            Plan - 04/20/18 1345    Clinical Impression Statement  Continued with established POC focusing on improving L knee flexion ROM. Introduced pt to NH[employer] Financialfor functional knee flexion ROM, fwd stool scoots with LLE only, as well as the Biodex for PROM. Pt tolerated all well not reporting any increased pain during. Pt really enjoyed the Biodex and wished to continue this; she was able to get up to 76% of flexion and 89% for extension. Continued with seated contract relax and seated heel slides for knee flexion ROM. AROM 4 to 83deg this date.     Rehab Potential  Fair    PT Frequency  2x / week    PT Duration  3 weeks    PT Treatment/Interventions  ADLs/Self Care Home Management;Aquatic Therapy;Cryotherapy;Electrical Stimulation;Moist Heat;Gait training;DME Instruction;Stair  training;Functional mobility training;Therapeutic activities;Therapeutic exercise;Balance training;Neuromuscular re-education;Patient/family education;Manual techniques;Compression bandaging;Scar mobilization;Orthotic Fit/Training;Passive range of motion;Dry needling;Energy conservation;Taping    PT Next Visit Plan  continue Biodex PROM, Focus on left knee flexion ROM until patient plateaus and begin hip/knee and functional strengthening.     PT Home Exercise Plan  eval: quad set, supine heel slide, seated HS stretch, seated knee flexion, hooklying SKTC (husband to assist)    Consulted and Agree with Plan of Care  Patient;Family member/caregiver    Family Member Consulted  husband       Patient will benefit from skilled therapeutic intervention in order to improve the following deficits and impairments:  Abnormal gait, Decreased activity tolerance, Decreased balance, Decreased endurance, Decreased mobility, Decreased range of motion, Decreased scar mobility, Decreased strength, Difficulty walking, Hypomobility,  Increased edema, Increased fascial restricitons, Increased muscle spasms, Impaired flexibility, Improper body mechanics, Postural dysfunction, Obesity, Pain  Visit Diagnosis: Stiffness of left knee, not elsewhere classified  Muscle weakness (generalized)  Localized edema  Acute pain of left knee     Problem List Patient Active Problem List   Diagnosis Date Noted  . Depression, major, single episode, mild ([place]) 03/07/2018  . OA (osteoarthritis) of knee 02/07/2018  . Other allergic contact dermatitis 12/20/2017  . Non-allergic rhinitis 12/16/2017  . Allergy to metal 12/16/2017  . Gastroesophageal reflux disease without esophagitis 07/15/2017  . Primary osteoarthritis of both feet 04/29/2017  . Family history of early CAD 04/15/2016  . Primary osteoarthritis of both knees 12/31/2015  . Chest pain 07/13/2015  . Atypical chest pain 07/13/2015  . Major depression 03/19/2015  .  Vulvodynia 08/03/2014  . Functional constipation 08/03/2014  . Anxiety 07/06/2014  . Fibromyalgia 04/25/2014  . Snoring 04/22/2014  . Prediabetes 03/18/2014  . Hyperlipidemia 03/18/2014  . Osteopenia 03/06/2014  . History of cardiac catheterization 04/28/2013  . Dyspepsia 02/02/2012  . GERD (gastroesophageal reflux disease) 02/02/2012  . Depression 06/18/2011  . Dysphagia 02/05/2011  . CHEST PAIN 02/11/2009  . GASTROESOPHAGEAL REFLUX DISEASE, CHRONIC 02/15/2007  . IRRITABLE BOWEL SYNDROME 02/15/2007  . Primary osteoarthritis of both hands 02/15/2007  . Reflux esophagitis 02/14/2007  . GASTRITIS 02/14/2007        Geraldine Solar PT, DPT   [facility] [address] [place], [place], [address] Phone: [phone]   Fax:  [phone]  Name: TANAYSHA ALKINS MRN: [id number] Date of Birth: Aug 16, 1940

## 2018-04-21 NOTE — Progress Notes (Deleted)
Office Visit Note  Patient: Ruth [name]             Date of Birth: Aug 25, 1940           MRN: [id number]             PCP: Kathyrn Drown, MD Referring: Kathyrn Drown, MD Visit Date: 05/05/2018 Occupation: @GUAROCC @  Subjective:  No chief complaint on file.   History of Present Illness: Ruth [name] is a 78 y.o. female ***   Activities of Daily Living:  Patient reports morning stiffness for *** {minute/hour:19697}.   Patient {ACTIONS;DENIES/REPORTS:21021675::"Denies"} nocturnal pain.  Difficulty dressing/grooming: {ACTIONS;DENIES/REPORTS:21021675::"Denies"} Difficulty climbing stairs: {ACTIONS;DENIES/REPORTS:21021675::"Denies"} Difficulty getting out of chair: {ACTIONS;DENIES/REPORTS:21021675::"Denies"} Difficulty using hands for taps, buttons, cutlery, and/or writing: {ACTIONS;DENIES/REPORTS:21021675::"Denies"}  No Rheumatology ROS completed.   PMFS History:  Patient Active Problem List   Diagnosis Date Noted  . Depression, major, single episode, mild ([place]) 03/07/2018  . OA (osteoarthritis) of knee 02/07/2018  . Other allergic contact dermatitis 12/20/2017  . Non-allergic rhinitis 12/16/2017  . Allergy to metal 12/16/2017  . Gastroesophageal reflux disease without esophagitis 07/15/2017  . Primary osteoarthritis of both feet 04/29/2017  . Family history of early CAD 04/15/2016  . Primary osteoarthritis of both knees 12/31/2015  . Chest pain 07/13/2015  . Atypical chest pain 07/13/2015  . Major depression 03/19/2015  . Vulvodynia 08/03/2014  . Functional constipation 08/03/2014  . Anxiety 07/06/2014  . Fibromyalgia 04/25/2014  . Snoring 04/22/2014  . Prediabetes 03/18/2014  . Hyperlipidemia 03/18/2014  . Osteopenia 03/06/2014  . History of cardiac catheterization 04/28/2013  . Dyspepsia 02/02/2012  . GERD (gastroesophageal reflux disease) 02/02/2012  . Depression 06/18/2011  . Dysphagia 02/05/2011  . CHEST PAIN 02/11/2009  . GASTROESOPHAGEAL REFLUX  DISEASE, CHRONIC 02/15/2007  . IRRITABLE BOWEL SYNDROME 02/15/2007  . Primary osteoarthritis of both hands 02/15/2007  . Reflux esophagitis 02/14/2007  . GASTRITIS 02/14/2007    Past Medical History:  Diagnosis Date  . Anxiety   . Arthritis   . Asthma   . Depression   . Eczema   . Fibromyalgia   . GERD (gastroesophageal reflux disease)    EGD/ colon 1/09  . H pylori ulcer 1980-1990   s/p treatment  . Headache   . Hyperlipidemia   . IBS (irritable bowel syndrome)   . Laryngospasm   . Neck pain   . OSA (obstructive sleep apnea)   . PONV (postoperative nausea and vomiting)   . Refusal of blood transfusions as patient is Jehovah's Witness   . Vertigo     Family History  Problem Relation Age of Onset  . Diabetes Father   . Heart attack Father        37's  . Lung cancer Father   . Cirrhosis Father 21       etoh cirrhosis  . Eczema Father   . Lung cancer Sister   . Depression Sister   . Alzheimer's disease Sister   . Hypertension Sister   . Diabetes Sister   . Lung cancer Sister   . Paranoid behavior Mother        depression  . Colon polyps Mother   . Eczema Mother   . Allergic rhinitis Neg Hx   . Asthma Neg Hx   . Urticaria Neg Hx    Past Surgical History:  Procedure Laterality Date  . ABDOMINAL HYSTERECTOMY  1970  . APPENDECTOMY    . BILATERAL SALPINGOOPHORECTOMY  1990s  . CARDIAC CATHETERIZATION  2002   (  [facility]) normal coronary arteries   . COLONOSCOPY  01/2007   Dr. Meriel Flavors  . COLONOSCOPY N/A 04/18/2015   Procedure: COLONOSCOPY;  Surgeon: Rogene Houston, MD;  [facility];  Service: Endoscopy;  Laterality: N/A;  830 - moved to 8:55 - Ann to notify pt  . COLONOSCOPY WITH ESOPHAGOGASTRODUODENOSCOPY (EGD)  02/16/2012   Procedure: COLONOSCOPY WITH ESOPHAGOGASTRODUODENOSCOPY (EGD);  Surgeon: Daneil Dolin, MD;  [facility];  Service: Endoscopy;  Laterality: N/A;  8:45  . ESOPHAGEAL DILATION N/A 06/07/2014   Procedure: ESOPHAGEAL  DILATION;  Surgeon: Rogene Houston, MD;  [facility];  Service: Endoscopy;  Laterality: N/A;  . ESOPHAGOGASTRODUODENOSCOPY  01/2007   Dr Sharlett Iles- 3 cm hiatal hernia, benign esophageal biopsies, erosive esophagitis, gastritis  . ESOPHAGOGASTRODUODENOSCOPY N/A 06/07/2014   Procedure: ESOPHAGOGASTRODUODENOSCOPY (EGD);  Surgeon: Rogene Houston, MD;  [facility];  Service: Endoscopy;  Laterality: N/A;  1200  . ESOPHAGOGASTRODUODENOSCOPY N/A 08/18/2017   Procedure: ESOPHAGOGASTRODUODENOSCOPY (EGD);  Surgeon: Rogene Houston, MD;  [facility];  Service: Endoscopy;  Laterality: N/A;  2:20  . FOOT SURGERY    . INNER EAR SURGERY     Left  . KNEE SURGERY     Multiple  . SHOULDER SURGERY  2011  . TONSILLECTOMY    . TOTAL KNEE ARTHROPLASTY Left 02/07/2018   Procedure: TOTAL KNEE ARTHROPLASTY;  Surgeon: Gaynelle Arabian, MD;  [facility];  Service: Orthopedics;  Laterality: Left;  94min  . TUBAL LIGATION    . TYMPANOSTOMY TUBE PLACEMENT     Social History   Social History Narrative   Married   2 grown children, 1 adopted son-autistic-lives next [employer] History  Administered Date(s) Administered  . Influenza,inj,Quad PF,6+ Mos 12/15/2017  . Influenza-Unspecified 01/16/2015, 11/22/2015  . Pneumococcal Conjugate-13 02/15/2014  . Zoster Recombinat (Shingrix) 07/10/2016     Objective: Vital Signs: There were no vitals taken for this visit.   Physical Exam   Musculoskeletal Exam: ***  CDAI Exam: CDAI Score: Not documented Patient Global Assessment: Not documented; Provider Global Assessment: Not documented Swollen: Not documented; Tender: Not documented Joint Exam   Not documented   There is currently no information documented on the homunculus. Go to the Rheumatology activity and complete the homunculus joint exam.  Investigation: No additional findings.  Imaging: No results found.  Recent Labs: Lab Results  Component Value  Date   WBC 10.3 02/09/2018   HGB 11.7 (L) 02/09/2018   PLT 249 02/09/2018   NA 137 02/09/2018   K 3.8 02/09/2018   CL 99 02/09/2018   CO2 28 02/09/2018   GLUCOSE 130 (H) 02/09/2018   BUN 19 02/09/2018   CREATININE 0.79 02/09/2018   BILITOT 0.4 02/03/2018   ALKPHOS 64 02/03/2018   AST 18 02/03/2018   ALT 17 02/03/2018   PROT 7.0 02/03/2018   ALBUMIN 4.0 02/03/2018   CALCIUM 8.8 (L) 02/09/2018   GFRAA >60 02/09/2018    Speciality Comments: No specialty comments available.  Procedures:  No procedures performed Allergies: Blood-group specific substance; Ciprofloxacin; Codeine; Latex; Vicodin [hydrocodone-acetaminophen]; and Tramadol   Assessment / Plan:     Visit Diagnoses: No diagnosis found.   Orders: No orders of the defined types were placed in this encounter.  No orders of the defined types were placed in this encounter.   Face-to-face time spent with patient was *** minutes. Greater than 50% of time was spent in counseling and coordination of care.  Follow-Up Instructions: No follow-ups on file.   Earnestine Mealing, CMA  Note - This record has been created using Editor, commissioning.  Chart creation errors have been sought, but may not always  have been located. Such creation errors do not reflect on  the standard of medical care.

## 2018-04-22 ENCOUNTER — Encounter (HOSPITAL_COMMUNITY): Payer: Self-pay

## 2018-04-22 ENCOUNTER — Ambulatory Visit (HOSPITAL_COMMUNITY): Payer: PPO

## 2018-04-22 DIAGNOSIS — M25662 Stiffness of left knee, not elsewhere classified: Secondary | ICD-10-CM

## 2018-04-22 DIAGNOSIS — M25562 Pain in left knee: Secondary | ICD-10-CM

## 2018-04-22 DIAGNOSIS — R600 Localized edema: Secondary | ICD-10-CM

## 2018-04-22 DIAGNOSIS — M6281 Muscle weakness (generalized): Secondary | ICD-10-CM

## 2018-04-22 NOTE — Therapy (Signed)
[place], [place], [address] Phone: [phone]   Fax:  [phone]  Physical Therapy Treatment  Patient Details  Name: Ruth [name] MRN: [id number] Date of Birth: Apr 06, 1940 Referring Provider (PT): Theresa Duty, [place]   Encounter Date: 04/22/2018  PT End of Session - 04/22/18 1301    Visit Number  15    Number of Visits  19    Date for PT Re-Evaluation  04/29/18    Authorization Type  [employer]    Authorization Time Period  02/22/18 to 04/05/18; New: 04/08/18 - 04/29/18    Authorization - Visit Number  15    Authorization - Number of Visits  19    PT Start Time  1301    PT Stop Time  1340    PT Time Calculation (min)  39 min    Activity Tolerance  Patient tolerated treatment well;No increased pain    Behavior During Therapy  WFL for tasks assessed/performed       Past Medical History:  Diagnosis Date  . Anxiety   . Arthritis   . Asthma   . Depression   . Eczema   . Fibromyalgia   . GERD (gastroesophageal reflux disease)    EGD/ colon 1/09  . H pylori ulcer 1980-1990   s/p treatment  . Headache   . Hyperlipidemia   . IBS (irritable bowel syndrome)   . Laryngospasm   . Neck pain   . OSA (obstructive sleep apnea)   . PONV (postoperative nausea and vomiting)   . Refusal of blood transfusions as patient is Jehovah's Witness   . Vertigo     Past Surgical History:  Procedure Laterality Date  . ABDOMINAL HYSTERECTOMY  1970  . APPENDECTOMY    . BILATERAL SALPINGOOPHORECTOMY  1990s  . CARDIAC CATHETERIZATION  2002   [facility]) normal coronary arteries   . COLONOSCOPY  01/2007   Dr. Meriel Flavors  . COLONOSCOPY N/A 04/18/2015   Procedure: COLONOSCOPY;  Surgeon: Rogene Houston, MD;  [facility];  Service: Endoscopy;  Laterality: N/A;  830 - moved to 8:55 - Ann to notify pt  . COLONOSCOPY WITH ESOPHAGOGASTRODUODENOSCOPY (EGD)  02/16/2012   Procedure: COLONOSCOPY WITH  ESOPHAGOGASTRODUODENOSCOPY (EGD);  Surgeon: Daneil Dolin, MD;  [facility];  Service: Endoscopy;  Laterality: N/A;  8:45  . ESOPHAGEAL DILATION N/A 06/07/2014   Procedure: ESOPHAGEAL DILATION;  Surgeon: Rogene Houston, MD;  [facility];  Service: Endoscopy;  Laterality: N/A;  . ESOPHAGOGASTRODUODENOSCOPY  01/2007   Dr Sharlett Iles- 3 cm hiatal hernia, benign esophageal biopsies, erosive esophagitis, gastritis  . ESOPHAGOGASTRODUODENOSCOPY N/A 06/07/2014   Procedure: ESOPHAGOGASTRODUODENOSCOPY (EGD);  Surgeon: Rogene Houston, MD;  [facility];  Service: Endoscopy;  Laterality: N/A;  1200  . ESOPHAGOGASTRODUODENOSCOPY N/A 08/18/2017   Procedure: ESOPHAGOGASTRODUODENOSCOPY (EGD);  Surgeon: Rogene Houston, MD;  [facility];  Service: Endoscopy;  Laterality: N/A;  2:20  . FOOT SURGERY    . INNER EAR SURGERY     Left  . KNEE SURGERY     Multiple  . SHOULDER SURGERY  2011  . TONSILLECTOMY    . TOTAL KNEE ARTHROPLASTY Left 02/07/2018   Procedure: TOTAL KNEE ARTHROPLASTY;  Surgeon: Gaynelle Arabian, MD;  [facility];  Service: Orthopedics;  Laterality: Left;  73mn  . TUBAL LIGATION    . TYMPANOSTOMY TUBE PLACEMENT      There were no vitals filed for this  visit.  Subjective Assessment - 04/22/18 1304    Subjective  Pt reports that she was a little sore following last session but that it went away the next day. She states she took something to help her sleep and she finally slept really well.     Patient Stated Goals  be able to function and stand and walk, potentially squat    Currently in Pain?  No/denies    Pain Onset  More than a month ago            [facility] Adult PT Treatment/Exercise - 04/22/18 0001      Knee/Hip Exercises: Stretches   Active Hamstring Stretch  Left;3 reps;30 seconds    Active Hamstring Stretch Limitations  standing, 12" step    Knee: Self-Stretch to increase Flexion  Left    Knee: Self-Stretch Limitations  10x10"  holds, 12" step    Gastroc Stretch  Both;3 reps;30 seconds    Gastroc Stretch Limitations  slant board      Knee/Hip Exercises: Aerobic   Nustep  x5 mins, L2 for functional knee flexion ROM      Knee/Hip Exercises: Machines for Strengthening   Other Machine  Biodex PROM x44mns: flex to 79%, ext to 91% (started her at ~65% for each and steadily increased)      Knee/Hip Exercises: Seated   Stool Scoot - Round Trips  fwd in rollator LLE only 230fx1RT for knee flexion    Other Seated Knee/Hip Exercises  contract relax 3x10" holds x2RT      Knee/Hip Exercises: Supine   Knee Extension Limitations  3    Knee Flexion Limitations  82           PT Education - 04/22/18 1303    Education Details  exercise technique, continue HEP    Person(s) Educated  Patient    Methods  Explanation;Demonstration    Comprehension  Verbalized understanding;Returned demonstration       PT Short Term Goals - 04/08/18 1407      PT SHORT TERM GOAL #1   Title  Pt will be independent with and consistently perform HEP to improve ROM and reduce pain.     Time  3    Period  Weeks    Status  Achieved      PT SHORT TERM GOAL #2   Title  Pt will have reduced edema by 2cm or > at joint line in order to maximize ROM and reduce pain.     Baseline  04/08/18: see objective measures    Time  3    Period  Weeks    Status  Partially Met      PT SHORT TERM GOAL #3   Title  Pt will have improved L knee AROM from 5 to 75 deg in order to reduce pain and maximize gait.     Baseline  04/08/18: 4-79 degrees     Time  3    Period  Weeks    Status  Achieved        PT Long Term Goals - 04/08/18 1408      PT LONG TERM GOAL #1   Title  Pt will have improved L knee AROM from 0 to 110deg in order to further reduce pain and maximize stair ambulation.     Baseline  04/08/18: 4-79 degrees    Time  6    Period  Weeks    Status  On-going      PT LONG TERM  GOAL #2   Title  Pt will have improved MMT to 4/5 or > throughout  mm groups tested in order to maximize gait and functional mobility.     Baseline  04/08/18: see MMT    Time  6    Period  Weeks    Status  Partially Met      PT LONG TERM GOAL #3   Title  Pt will be able to perform 12STS during 30sec chair rise test without UE support and proper form in order to demo improved balance and functional strength.     Baseline  04/08/18: 9x, no UE support    Time  6    Period  Weeks    Status  On-going      PT LONG TERM GOAL #4   Title  Pt will be able to perform L SLS for 10sec or > to demo improved balance, core and functional strength in order to maximize gait, stairs, and other functional mobility.     Baseline  1/27: 56sec LLE    Time  6    Period  Weeks    Status  Achieved      PT LONG TERM GOAL #5   Title  Pt will be able to ambulate at least 437f during 3MWT with LRAD, 2/10 L knee pain, and gait WFL in order to demo improved tolerance to functional mobility and maximize her HH ambulation.    Baseline  04/08/18: Less than 2/10 pain with ambulation 620 feet    Time  6    Period  Weeks    Status  Achieved            Plan - 04/22/18 1341    Clinical Impression Statement  Continued with established POC focusing on improving overall ROM. Progressed her to the 12" step during standing stretching to increase the stretches, which she tolerated well. Continued with Biodex for PROM and Nustep for functional L knee AROM. Pt able to tolerate slightly more % for flexion on the Biodex this date. AROM at EOS was 3 to 82deg. Still no word on the JAS brace referral as of yet; will continue to f/u with this each session. Pt's AROM holding steady in the low-mid 80s so functional strengthening can begin within her current ROM.     Rehab Potential  Fair    PT Frequency  2x / week    PT Duration  3 weeks    PT Treatment/Interventions  ADLs/Self Care Home Management;Aquatic Therapy;Cryotherapy;Electrical Stimulation;Moist Heat;Gait training;DME Instruction;Stair  training;Functional mobility training;Therapeutic activities;Therapeutic exercise;Balance training;Neuromuscular re-education;Patient/family education;Manual techniques;Compression bandaging;Scar mobilization;Orthotic Fit/Training;Passive range of motion;Dry needling;Energy conservation;Taping    PT Next Visit Plan  continue Biodex PROM, and begin functional strenhtening as AROM hodling steady in low-mid 80s. Focus on left knee flexion ROM until patient plateaus and begin hip/knee and functional strengthening.     PT Home Exercise Plan  eval: quad set, supine heel slide, seated HS stretch, seated knee flexion, hooklying SKTC (husband to assist); 3/6: stool scoots for knee flexoin    Consulted and Agree with Plan of Care  Patient;Family member/caregiver    Family Member Consulted  husband       Patient will benefit from skilled therapeutic intervention in order to improve the following deficits and impairments:  Abnormal gait, Decreased activity tolerance, Decreased balance, Decreased endurance, Decreased mobility, Decreased range of motion, Decreased scar mobility, Decreased strength, Difficulty walking, Hypomobility, Increased edema, Increased fascial restricitons, Increased muscle spasms, Impaired flexibility, Improper body  mechanics, Postural dysfunction, Obesity, Pain  Visit Diagnosis: Stiffness of left knee, not elsewhere classified  Muscle weakness (generalized)  Localized edema  Acute pain of left knee     Problem List Patient Active Problem List   Diagnosis Date Noted  . Depression, major, single episode, mild ([place]) 03/07/2018  . OA (osteoarthritis) of knee 02/07/2018  . Other allergic contact dermatitis 12/20/2017  . Non-allergic rhinitis 12/16/2017  . Allergy to metal 12/16/2017  . Gastroesophageal reflux disease without esophagitis 07/15/2017  . Primary osteoarthritis of both feet 04/29/2017  . Family history of early CAD 04/15/2016  . Primary osteoarthritis of both knees  12/31/2015  . Chest pain 07/13/2015  . Atypical chest pain 07/13/2015  . Major depression 03/19/2015  . Vulvodynia 08/03/2014  . Functional constipation 08/03/2014  . Anxiety 07/06/2014  . Fibromyalgia 04/25/2014  . Snoring 04/22/2014  . Prediabetes 03/18/2014  . Hyperlipidemia 03/18/2014  . Osteopenia 03/06/2014  . History of cardiac catheterization 04/28/2013  . Dyspepsia 02/02/2012  . GERD (gastroesophageal reflux disease) 02/02/2012  . Depression 06/18/2011  . Dysphagia 02/05/2011  . CHEST PAIN 02/11/2009  . GASTROESOPHAGEAL REFLUX DISEASE, CHRONIC 02/15/2007  . IRRITABLE BOWEL SYNDROME 02/15/2007  . Primary osteoarthritis of both hands 02/15/2007  . Reflux esophagitis 02/14/2007  . GASTRITIS 02/14/2007       Geraldine Solar PT, DPT   [facility] [address] [place], [place], [address] Phone: [phone]   Fax:  [phone]  Name: Ruth [name] MRN: [id number] Date of Birth: Apr 10, 1940

## 2018-04-25 ENCOUNTER — Encounter (HOSPITAL_COMMUNITY): Payer: Self-pay

## 2018-04-25 ENCOUNTER — Ambulatory Visit (HOSPITAL_COMMUNITY): Payer: PPO

## 2018-04-25 DIAGNOSIS — R600 Localized edema: Secondary | ICD-10-CM

## 2018-04-25 DIAGNOSIS — M25662 Stiffness of left knee, not elsewhere classified: Secondary | ICD-10-CM

## 2018-04-25 DIAGNOSIS — M6281 Muscle weakness (generalized): Secondary | ICD-10-CM

## 2018-04-25 DIAGNOSIS — M25562 Pain in left knee: Secondary | ICD-10-CM

## 2018-04-25 NOTE — Therapy (Signed)
[place], [place], [address] Phone: [phone]   Fax:  [phone]  Physical Therapy Treatment  Patient Details  Name: Ruth [name] MRN: [id number] Date of Birth: 11-03-1940 Referring Provider (PT): Theresa Duty, [place]   Encounter Date: 04/25/2018  PT End of Session - 04/25/18 1335    Visit Number  16    Number of Visits  19    Date for PT Re-Evaluation  04/29/18    Authorization Type  [employer]    Authorization Time Period  02/22/18 to 04/05/18; New: 04/08/18 - 04/29/18    Authorization - Visit Number  16    Authorization - Number of Visits  19    PT Start Time  7741    PT Stop Time  1345    PT Time Calculation (min)  39 min    Activity Tolerance  Patient tolerated treatment well;No increased pain    Behavior During Therapy  WFL for tasks assessed/performed       Past Medical History:  Diagnosis Date  . Anxiety   . Arthritis   . Asthma   . Depression   . Eczema   . Fibromyalgia   . GERD (gastroesophageal reflux disease)    EGD/ colon 1/09  . H pylori ulcer 1980-1990   s/p treatment  . Headache   . Hyperlipidemia   . IBS (irritable bowel syndrome)   . Laryngospasm   . Neck pain   . OSA (obstructive sleep apnea)   . PONV (postoperative nausea and vomiting)   . Refusal of blood transfusions as patient is Jehovah's Witness   . Vertigo     Past Surgical History:  Procedure Laterality Date  . ABDOMINAL HYSTERECTOMY  1970  . APPENDECTOMY    . BILATERAL SALPINGOOPHORECTOMY  1990s  . CARDIAC CATHETERIZATION  2002   [facility]) normal coronary arteries   . COLONOSCOPY  01/2007   Dr. Meriel Flavors  . COLONOSCOPY N/A 04/18/2015   Procedure: COLONOSCOPY;  Surgeon: Rogene Houston, MD;  [facility];  Service: Endoscopy;  Laterality: N/A;  830 - moved to 8:55 - Ann to notify pt  . COLONOSCOPY WITH ESOPHAGOGASTRODUODENOSCOPY (EGD)  02/16/2012   Procedure: COLONOSCOPY WITH  ESOPHAGOGASTRODUODENOSCOPY (EGD);  Surgeon: Daneil Dolin, MD;  [facility];  Service: Endoscopy;  Laterality: N/A;  8:45  . ESOPHAGEAL DILATION N/A 06/07/2014   Procedure: ESOPHAGEAL DILATION;  Surgeon: Rogene Houston, MD;  [facility];  Service: Endoscopy;  Laterality: N/A;  . ESOPHAGOGASTRODUODENOSCOPY  01/2007   Dr Sharlett Iles- 3 cm hiatal hernia, benign esophageal biopsies, erosive esophagitis, gastritis  . ESOPHAGOGASTRODUODENOSCOPY N/A 06/07/2014   Procedure: ESOPHAGOGASTRODUODENOSCOPY (EGD);  Surgeon: Rogene Houston, MD;  [facility];  Service: Endoscopy;  Laterality: N/A;  1200  . ESOPHAGOGASTRODUODENOSCOPY N/A 08/18/2017   Procedure: ESOPHAGOGASTRODUODENOSCOPY (EGD);  Surgeon: Rogene Houston, MD;  [facility];  Service: Endoscopy;  Laterality: N/A;  2:20  . FOOT SURGERY    . INNER EAR SURGERY     Left  . KNEE SURGERY     Multiple  . SHOULDER SURGERY  2011  . TONSILLECTOMY    . TOTAL KNEE ARTHROPLASTY Left 02/07/2018   Procedure: TOTAL KNEE ARTHROPLASTY;  Surgeon: Gaynelle Arabian, MD;  [facility];  Service: Orthopedics;  Laterality: Left;  55mn  . TUBAL LIGATION    . TYMPANOSTOMY TUBE PLACEMENT      There were no vitals filed for this  visit.  Subjective Assessment - 04/25/18 1309    Subjective  Pt states she drove herself to therapy and her knee is feeling much better. Pt reports she sees the doctor tomorrow.    Patient Stated Goals  be able to function and stand and walk, potentially squat    Currently in Pain?  No/denies    Pain Onset  More than a month ago            [facility] Adult PT Treatment/Exercise - 04/25/18 0001      Knee/Hip Exercises: Aerobic   Nustep  x5 mins, L2 for functional knee flexion ROM, seat 7-8      Knee/Hip Exercises: Machines for Strengthening   Other Machine  Biodex PROM x35mns: flex to 81%, ext to 91% (started her at 70% for each and steadily increased)      Knee/Hip Exercises: Standing    Hip ADduction  Both;2 sets;10 reps    Lateral Step Up  Left;10 reps;Step Height: 6"    Forward Step Up  Left;10 reps;Step Height: 6"    Step Down  Left;10 reps;Step Height: 4"    Step Down Limitations  attempted 6", but reduced to 4" due to lack of ROM    Other Standing Knee Exercises  sidestep in // bars, RTB, x2RT      Knee/Hip Exercises: Seated   Other Seated Knee/Hip Exercises  contract relax 3x10" holds x1RT    Sit to Sand  2 sets;10 reps;without UE support   second set with 2" step under R foot     Knee/Hip Exercises: Supine   Knee Extension  AROM    Knee Extension Limitations  3    Knee Flexion  AROM    Knee Flexion Limitations  83          PT Education - 04/25/18 1334    Education Details  exercise technique, continue HEP    Person(s) Educated  Patient    Methods  Explanation;Demonstration    Comprehension  Verbalized understanding;Returned demonstration       PT Short Term Goals - 04/08/18 1407      PT SHORT TERM GOAL #1   Title  Pt will be independent with and consistently perform HEP to improve ROM and reduce pain.     Time  3    Period  Weeks    Status  Achieved      PT SHORT TERM GOAL #2   Title  Pt will have reduced edema by 2cm or > at joint line in order to maximize ROM and reduce pain.     Baseline  04/08/18: see objective measures    Time  3    Period  Weeks    Status  Partially Met      PT SHORT TERM GOAL #3   Title  Pt will have improved L knee AROM from 5 to 75 deg in order to reduce pain and maximize gait.     Baseline  04/08/18: 4-79 degrees     Time  3    Period  Weeks    Status  Achieved        PT Long Term Goals - 04/08/18 1408      PT LONG TERM GOAL #1   Title  Pt will have improved L knee AROM from 0 to 110deg in order to further reduce pain and maximize stair ambulation.     Baseline  04/08/18: 4-79 degrees    Time  6    Period  Weeks    Status  On-going      PT LONG TERM GOAL #2   Title  Pt will have improved MMT to 4/5  or > throughout mm groups tested in order to maximize gait and functional mobility.     Baseline  04/08/18: see MMT    Time  6    Period  Weeks    Status  Partially Met      PT LONG TERM GOAL #3   Title  Pt will be able to perform 12STS during 30sec chair rise test without UE support and proper form in order to demo improved balance and functional strength.     Baseline  04/08/18: 9x, no UE support    Time  6    Period  Weeks    Status  On-going      PT LONG TERM GOAL #4   Title  Pt will be able to perform L SLS for 10sec or > to demo improved balance, core and functional strength in order to maximize gait, stairs, and other functional mobility.     Baseline  1/27: 56sec LLE    Time  6    Period  Weeks    Status  Achieved      PT LONG TERM GOAL #5   Title  Pt will be able to ambulate at least 494f during 3MWT with LRAD, 2/10 L knee pain, and gait WFL in order to demo improved tolerance to functional mobility and maximize her HH ambulation.    Baseline  04/08/18: Less than 2/10 pain with ambulation 620 feet    Time  6    Period  Weeks    Status  Achieved            Plan - 04/25/18 1346    Clinical Impression Statement  Continued with established POC focusing on improving overall ROM and introduced pt to functional strengthening this date since her AROM has maintained in the low-mid 80s for the last few weeks. Pt again able to tolerate slightly more flexion % during the Biodex. Added step ups, STS, sidestepping and hip abd for functional strengthening. Pt tolerated well, not reporting any increased pain during therex; just requiring min cues for form AROM 3 to 83deg this date. Pt f/u with Dr. AWynelle Linktomorrow so PT forwarded him today's note. Continue as planned, progressing as able.     Rehab Potential  Fair    PT Frequency  2x / week    PT Duration  3 weeks    PT Treatment/Interventions  ADLs/Self Care Home Management;Aquatic Therapy;Cryotherapy;Electrical Stimulation;Moist  Heat;Gait training;DME Instruction;Stair training;Functional mobility training;Therapeutic activities;Therapeutic exercise;Balance training;Neuromuscular re-education;Patient/family education;Manual techniques;Compression bandaging;Scar mobilization;Orthotic Fit/Training;Passive range of motion;Dry needling;Energy conservation;Taping    PT Next Visit Plan  continue Biodex PROM, continue functional strenhtening as AROM hodling steady in low-mid 80s. Focus on left knee flexion ROM until patient plateaus and begin hip/knee and functional strengthening.     PT Home Exercise Plan  eval: quad set, supine heel slide, seated HS stretch, seated knee flexion, hooklying SKTC (husband to assist); 3/6: stool scoots for knee flexoin    Consulted and Agree with Plan of Care  Patient       Patient will benefit from skilled therapeutic intervention in order to improve the following deficits and impairments:  Abnormal gait, Decreased activity tolerance, Decreased balance, Decreased endurance, Decreased mobility, Decreased range of motion, Decreased scar mobility, Decreased strength, Difficulty walking, Hypomobility, Increased edema, Increased fascial restricitons, Increased muscle spasms,  Impaired flexibility, Improper body mechanics, Postural dysfunction, Obesity, Pain  Visit Diagnosis: Stiffness of left knee, not elsewhere classified  Muscle weakness (generalized)  Localized edema  Acute pain of left knee     Problem List Patient Active Problem List   Diagnosis Date Noted  . Depression, major, single episode, mild ([place]) 03/07/2018  . OA (osteoarthritis) of knee 02/07/2018  . Other allergic contact dermatitis 12/20/2017  . Non-allergic rhinitis 12/16/2017  . Allergy to metal 12/16/2017  . Gastroesophageal reflux disease without esophagitis 07/15/2017  . Primary osteoarthritis of both feet 04/29/2017  . Family history of early CAD 04/15/2016  . Primary osteoarthritis of both knees 12/31/2015  . Chest  pain 07/13/2015  . Atypical chest pain 07/13/2015  . Major depression 03/19/2015  . Vulvodynia 08/03/2014  . Functional constipation 08/03/2014  . Anxiety 07/06/2014  . Fibromyalgia 04/25/2014  . Snoring 04/22/2014  . Prediabetes 03/18/2014  . Hyperlipidemia 03/18/2014  . Osteopenia 03/06/2014  . History of cardiac catheterization 04/28/2013  . Dyspepsia 02/02/2012  . GERD (gastroesophageal reflux disease) 02/02/2012  . Depression 06/18/2011  . Dysphagia 02/05/2011  . CHEST PAIN 02/11/2009  . GASTROESOPHAGEAL REFLUX DISEASE, CHRONIC 02/15/2007  . IRRITABLE BOWEL SYNDROME 02/15/2007  . Primary osteoarthritis of both hands 02/15/2007  . Reflux esophagitis 02/14/2007  . GASTRITIS 02/14/2007      Geraldine Solar PT, DPT  [facility] [address] [place], [place], [address] Phone: [phone]   Fax:  [phone]  Name: VELVIA MEHRER MRN: [id number] Date of Birth: October 21, 1940

## 2018-04-26 ENCOUNTER — Telehealth: Payer: Self-pay | Admitting: Family Medicine

## 2018-04-26 NOTE — Telephone Encounter (Signed)
Pt would like to know if Dr. Nicki Reaper would call her in valium. She states she is still experiencing shakes and trimmers since surgery. Not as bad as it first was but she is now taking 10 mgs of valium in the AM and PM. She states that it is helping her sleep all night and no shakes through out the day.   If Dr. Nicki Reaper is able to call this in please send to [facility] [place], [place] AT [place]

## 2018-04-26 NOTE — Telephone Encounter (Signed)
Please advise. Thank you

## 2018-04-27 ENCOUNTER — Other Ambulatory Visit: Payer: Self-pay | Admitting: Family Medicine

## 2018-04-27 MED ORDER — DIAZEPAM 5 MG PO TABS: ORAL_TABLET | ORAL | 1 refills | Status: DC

## 2018-04-27 NOTE — Telephone Encounter (Signed)
The best thing is the Valium 5 mg I believe that will help her 1 twice daily as needed caution drowsiness #60 with 1 refill Recommend follow-up office visit if progressive troubles or worse keep all regular follow-ups

## 2018-04-27 NOTE — Telephone Encounter (Signed)
Pt states she is willing to try the 5 mg Valium. Pt also would like to know if there is something else that will help with shaking and tremors since she is  78 years old? Please advise. Thank you

## 2018-04-27 NOTE — Telephone Encounter (Signed)
For her age I would not recommend 10 mg because of risk of accidental falls and injury For individuals 40 and up is not recommended to be on Valium but if the patient finds it to be effective we can try 5 mg Please discuss with the patient if she is interested we can send in some for short-term use

## 2018-04-27 NOTE — Telephone Encounter (Signed)
Pt contacted and verbalized understanding. Script printed awaiting signature.

## 2018-04-28 ENCOUNTER — Other Ambulatory Visit: Payer: Self-pay

## 2018-04-28 ENCOUNTER — Ambulatory Visit (HOSPITAL_COMMUNITY): Payer: PPO

## 2018-04-28 ENCOUNTER — Encounter (HOSPITAL_COMMUNITY): Payer: Self-pay

## 2018-04-28 DIAGNOSIS — M25662 Stiffness of left knee, not elsewhere classified: Secondary | ICD-10-CM | POA: Diagnosis not present

## 2018-04-28 DIAGNOSIS — M25562 Pain in left knee: Secondary | ICD-10-CM

## 2018-04-28 DIAGNOSIS — R600 Localized edema: Secondary | ICD-10-CM

## 2018-04-28 DIAGNOSIS — M6281 Muscle weakness (generalized): Secondary | ICD-10-CM

## 2018-04-28 NOTE — Therapy (Signed)
[place], [place], [address] Phone: [phone]   Fax:  [phone]   Progress Note Reporting Period 04/28/18 to 05/26/18  See note below for Objective Data and Assessment of Progress/Goals.    Physical Therapy Treatment  Patient Details  Name: Ruth [name] MRN: [id number] Date of Birth: [birth year] Referring Provider (PT): Theresa Duty, [place]   Encounter Date: 04/28/2018  PT End of Session - 04/28/18 1259    Visit Number  17    Number of Visits  19    Date for PT Re-Evaluation  05/26/18    Authorization Type  [employer]    Authorization Time Period  02/22/18 to 04/05/18; New: 04/08/18 - 04/29/18; HEP POC: 04/28/18 to 05/26/18    Authorization - Visit Number  17    Authorization - Number of Visits  19    PT Start Time  1300    PT Stop Time  1327    PT Time Calculation (min)  27 min    Activity Tolerance  Patient tolerated treatment well;No increased pain    Behavior During Therapy  WFL for tasks assessed/performed       Past Medical History:  Diagnosis Date  . Anxiety   . Arthritis   . Asthma   . Depression   . Eczema   . Fibromyalgia   . GERD (gastroesophageal reflux disease)    EGD/ colon 1/09  . H pylori ulcer 1980-1990   s/p treatment  . Headache   . Hyperlipidemia   . IBS (irritable bowel syndrome)   . Laryngospasm   . Neck pain   . OSA (obstructive sleep apnea)   . PONV (postoperative nausea and vomiting)   . Refusal of blood transfusions as patient is Jehovah's Witness   . Vertigo     Past Surgical History:  Procedure Laterality Date  . ABDOMINAL HYSTERECTOMY  1970  . APPENDECTOMY    . BILATERAL SALPINGOOPHORECTOMY  1990s  . CARDIAC CATHETERIZATION  2002   [facility]) normal coronary arteries   . COLONOSCOPY  01/2007   Dr. Meriel Flavors  . COLONOSCOPY N/A 04/18/2015   Procedure: COLONOSCOPY;  Surgeon: Rogene Houston, MD;  [facility];  Service: Endoscopy;   Laterality: N/A;  830 - moved to 8:55 - Ann to notify pt  . COLONOSCOPY WITH ESOPHAGOGASTRODUODENOSCOPY (EGD)  02/16/2012   Procedure: COLONOSCOPY WITH ESOPHAGOGASTRODUODENOSCOPY (EGD);  Surgeon: Daneil Dolin, MD;  Location: [facility];  Service: Endoscopy;  Laterality: N/A;  8:45  . ESOPHAGEAL DILATION N/A 06/07/2014   Procedure: ESOPHAGEAL DILATION;  Surgeon: Rogene Houston, MD;  [facility];  Service: Endoscopy;  Laterality: N/A;  . ESOPHAGOGASTRODUODENOSCOPY  01/2007   Dr Sharlett Iles- 3 cm hiatal hernia, benign esophageal biopsies, erosive esophagitis, gastritis  . ESOPHAGOGASTRODUODENOSCOPY N/A 06/07/2014   Procedure: ESOPHAGOGASTRODUODENOSCOPY (EGD);  Surgeon: Rogene Houston, MD;  [facility];  Service: Endoscopy;  Laterality: N/A;  1200  . ESOPHAGOGASTRODUODENOSCOPY N/A 08/18/2017   Procedure: ESOPHAGOGASTRODUODENOSCOPY (EGD);  Surgeon: Rogene Houston, MD;  [facility];  Service: Endoscopy;  Laterality: N/A;  2:20  . FOOT SURGERY    . INNER EAR SURGERY     Left  . KNEE SURGERY     Multiple  . SHOULDER SURGERY  2011  . TONSILLECTOMY    . TOTAL KNEE ARTHROPLASTY Left 02/07/2018   Procedure: TOTAL KNEE ARTHROPLASTY;  Surgeon: Gaynelle Arabian, MD;  Location: [facility];  Service: Orthopedics;  Laterality: Left;  48mn  . TUBAL LIGATION    . TYMPANOSTOMY TUBE PLACEMENT      There were no vitals filed for this visit.  Subjective Assessment - 04/28/18 1302    Subjective  Pt states that she saw Dr. AWynelle Linkyesterday and he was pleased with her progress. He will not sign the JAS brace referral because he said the brace for flexion is "tortuous."     Patient Stated Goals  be able to function and stand and walk, potentially squat    Currently in Pain?  No/denies    Pain Onset  More than a month ago         [facility]18 0001      Assessment   Medical Diagnosis  L TKA    Referring Provider (PT)  KTheresa Duty PA-C    Onset  Date/Surgical Date  02/07/18    Next MD Visit  3 months    Prior Therapy  HHPT d/c on 02/18/18      Circumferential Edema   Circumferential - Left   46cm, joint line   was 49cm at joint line     Sit to Stand   Comments  30sec chair rise test: 10x   was 9x     AROM   Left Knee Extension  3   was 4   Left Knee Flexion  81   was 79     Strength   Left Hip Extension  4-/5   based on functional assessment, was 3+   Left Knee Flexion  4-/5   based on functional assessment, was 3-             PT Education - 04/28/18 1259    Education Details  reassessment findings, 4-week HEP POC    Person(s) Educated  Patient;Spouse    Methods  Explanation;Demonstration;Handout    Comprehension  Verbalized understanding       PT Short Term Goals - 04/28/18 1305      PT SHORT TERM GOAL #1   Title  Pt will be independent with and consistently perform HEP to improve ROM and reduce pain.     Time  3    Period  Weeks    Status  Achieved      PT SHORT TERM GOAL #2   Title  Pt will have reduced edema by 2cm or > at joint line in order to maximize ROM and reduce pain.     Baseline  --    Time  3    Period  Weeks    Status  Achieved      PT SHORT TERM GOAL #3   Title  Pt will have improved L knee AROM from 5 to 75 deg in order to reduce pain and maximize gait.     Baseline  3/12: 3-81deg    Time  3    Period  Weeks    Status  Achieved        PT Long Term Goals - 04/28/18 1305      PT LONG TERM GOAL #1   Title  Pt will have improved L knee AROM from 0 to 110deg in order to further reduce pain and maximize stair ambulation.     Baseline  3/12: 3-81deg    Time  6    Period  Weeks    Status  On-going      PT LONG TERM GOAL #2   Title  Pt will have improved  MMT to 4/5 or > throughout mm groups tested in order to maximize gait and functional mobility.     Baseline  3/12: see MMT    Time  6    Period  Weeks    Status  Partially Met      PT LONG TERM GOAL #3   Title  Pt will  be able to perform 12STS during 30sec chair rise test without UE support and proper form in order to demo improved balance and functional strength.     Baseline  3/12: 10x    Time  6    Period  Weeks    Status  On-going      PT LONG TERM GOAL #4   Title  Pt will be able to perform L SLS for 10sec or > to demo improved balance, core and functional strength in order to maximize gait, stairs, and other functional mobility.     Baseline  1/27: 56sec LLE    Time  6    Period  Weeks    Status  Achieved      PT LONG TERM GOAL #5   Title  Pt will be able to ambulate at least 436f during 3MWT with LRAD, 2/10 L knee pain, and gait WFL in order to demo improved tolerance to functional mobility and maximize her HH ambulation.    Baseline  04/08/18: Less than 2/10 pain with ambulation 620 feet    Time  6    Period  Weeks    Status  Achieved            Plan - 04/28/18 1337    Clinical Impression Statement  Pt f/u with Dr. AWynelle Linkyesterday and he said that he was pleased with her progress thus far but he was not going to sign the JAS brace referral because it is "torturous" for knee flexion ROM improvements. PT reassessed pt's goals and outcome measures this date. Pt has met all STG and progressed nicely towards her LTG as illustrated above. Pt's swelling has reduced at joint line, her MMT is 4-/5 or better throughout all mm groups, her balance has normalized, and her functional strength has improved AEB 30sec chair rise test improving to 10x. Her AROM has maintained nicely in the 80s as she was 3-81deg this date. At this time, pt has reached her maximum potential with physical therapy services and PT recommends placing pt on 4-week HEP POC for her to maintain progress gained in therapy. PT provided her with extensive strengthening and balance HEP and educated her to continue to stretch and walk every day. Pt agreeable to this plan and will like be d/c after the 4-week HEP POC.     Rehab Potential   Fair    PT Frequency  2x / week    PT Duration  3 weeks    PT Treatment/Interventions  ADLs/Self Care Home Management;Aquatic Therapy;Cryotherapy;Electrical Stimulation;Moist Heat;Gait training;DME Instruction;Stair training;Functional mobility training;Therapeutic activities;Therapeutic exercise;Balance training;Neuromuscular re-education;Patient/family education;Manual techniques;Compression bandaging;Scar mobilization;Orthotic Fit/Training;Passive range of motion;Dry needling;Energy conservation;Taping    PT Next Visit Plan  f/u after 4-week HEP POC    PT Home Exercise Plan  eval: quad set, supine heel slide, seated HS stretch, seated knee flexion, hooklying SKTC (husband to assist); 3/6: stool scoots for knee flexoin; 3/12: see extensive additions below    Consulted and Agree with Plan of Care  Patient       Patient will benefit from skilled therapeutic intervention in order to improve the following deficits  and impairments:  Abnormal gait, Decreased activity tolerance, Decreased balance, Decreased endurance, Decreased mobility, Decreased range of motion, Decreased scar mobility, Decreased strength, Difficulty walking, Hypomobility, Increased edema, Increased fascial restricitons, Increased muscle spasms, Impaired flexibility, Improper body mechanics, Postural dysfunction, Obesity, Pain  Visit Diagnosis: Stiffness of left knee, not elsewhere classified - Plan: PT plan of care cert/re-cert  Muscle weakness (generalized) - Plan: PT plan of care cert/re-cert  Localized edema - Plan: PT plan of care cert/re-cert  Acute pain of left knee - Plan: PT plan of care cert/re-cert     Problem List Patient Active Problem List   Diagnosis Date Noted  . Depression, major, single episode, mild ([place]) 03/07/2018  . OA (osteoarthritis) of knee 02/07/2018  . Other allergic contact dermatitis 12/20/2017  . Non-allergic rhinitis 12/16/2017  . Allergy to metal 12/16/2017  . Gastroesophageal reflux  disease without esophagitis 07/15/2017  . Primary osteoarthritis of both feet 04/29/2017  . Family history of early CAD 04/15/2016  . Primary osteoarthritis of both knees 12/31/2015  . Chest pain 07/13/2015  . Atypical chest pain 07/13/2015  . Major depression 03/19/2015  . Vulvodynia 08/03/2014  . Functional constipation 08/03/2014  . Anxiety 07/06/2014  . Fibromyalgia 04/25/2014  . Snoring 04/22/2014  . Prediabetes 03/18/2014  . Hyperlipidemia 03/18/2014  . Osteopenia 03/06/2014  . History of cardiac catheterization 04/28/2013  . Dyspepsia 02/02/2012  . GERD (gastroesophageal reflux disease) 02/02/2012  . Depression 06/18/2011  . Dysphagia 02/05/2011  . CHEST PAIN 02/11/2009  . GASTROESOPHAGEAL REFLUX DISEASE, CHRONIC 02/15/2007  . IRRITABLE BOWEL SYNDROME 02/15/2007  . Primary osteoarthritis of both hands 02/15/2007  . Reflux esophagitis 02/14/2007  . GASTRITIS 02/14/2007        Geraldine Solar PT, DPT   [facility] [address] [place], [place], [address] Phone: [phone]   Fax:  [phone]  Name: Ruth [name] MRN: [id number] Date of Birth: 03-Oct-1940

## 2018-04-28 NOTE — Patient Instructions (Signed)
Access Code: [email or web address]  URL: [email or web address]  Date: 04/28/2018  Prepared by: Geraldine Solar   Exercises Knee Extension with Weight Machine - 10 reps - 3 sets - 1x daily - 7x weekly Single Leg Knee Extension with Weight Machine - 10 reps - 3 sets                            - 1x daily - 7x weekly Hamstring Curl with Weight Machine - 10 reps - 3 sets - 1x daily - 7x weekly Single Leg Hamstring Curl with Weight Machine - 10 reps - 3 sets                            - 1x daily - 7x weekly Full Leg Press - 10 reps - 3 sets - 1x daily - 7x weekly Sidelying Hip Abduction - 10 reps - 3 sets - 1x daily - 7x weekly Supine Bridge - 10 reps - 3 sets - 1x daily - 7x weekly Side Stepping with Resistance at Ankles - 10 reps - 3 sets - 1x daily - 7x weekly Standing Hip Abduction with Resistance at Ankles - 10 reps - 3 sets - 1x daily - 7x weekly Mini Squat with Chair - 10 reps - 3 sets - 1x daily - 7x weekly Step Up - 10 reps - 3 sets - 1x daily - 7x weekly Lateral Step Ups - 10 reps - 3 sets - 1x daily - 7x weekly Tandem Stance in Corner - 10 reps - 3 sets - 1x daily - 7x weekly Single Leg Stance - 10 reps - 3 sets - 1x daily - 7x weekly Standing 3-Way Kick - 10 reps - 3 sets - 1x daily - 7x weekly

## 2018-05-05 ENCOUNTER — Ambulatory Visit: Payer: PPO | Admitting: Rheumatology

## 2018-05-18 ENCOUNTER — Telehealth (HOSPITAL_COMMUNITY): Payer: Self-pay

## 2018-05-18 NOTE — Telephone Encounter (Signed)
Left pt voicemail regarding clinic closing due to COVID-19 to ensure pt and staff safety. PT cancelled her 78mo f/u visit on 06/02/18. Asked pt in voicemail if she wanted to go ahead and be d/c or if she wished to have this rescheduled; if so, PT asked that pt call clinic and left her front office # to return call.   Geraldine Solar PT, DPT

## 2018-05-20 ENCOUNTER — Ambulatory Visit: Payer: PPO | Admitting: Family Medicine

## 2018-05-20 ENCOUNTER — Telehealth: Payer: Self-pay | Admitting: Family Medicine

## 2018-05-20 NOTE — Telephone Encounter (Signed)
pts husband wants to know if Ruth [name] should have her lab work done or not right now. She is worried about her calcium.

## 2018-05-20 NOTE — Telephone Encounter (Signed)
Husband(DPR) notified and verbalized understanding. 

## 2018-05-20 NOTE — Telephone Encounter (Signed)
I would recommend that the patient wait until start of June-hopefully at that time this situation on coronavirus is improved-she can check back with [place] at the start of June

## 2018-05-20 NOTE — Telephone Encounter (Signed)
Advised of postponing labs until summer per Dr but wants Dr Nicki Reaper asked if she is k to wait for her labs

## 2018-06-02 ENCOUNTER — Ambulatory Visit (HOSPITAL_COMMUNITY): Payer: PPO

## 2018-06-02 ENCOUNTER — Telehealth (HOSPITAL_COMMUNITY): Payer: Self-pay

## 2018-06-02 NOTE — Telephone Encounter (Signed)
Called and left voicemail for pt regarding our clinic being closed until further notice due to COVID-19. Asked pt to return phone call to let clinic know if she wanted to reschedule her 1 appt that was cancelled or if she wanted to go ahead and be d/c at this time. Left front office #s for pt to return call.   Geraldine Solar PT, DPT

## 2018-06-03 ENCOUNTER — Telehealth: Payer: Self-pay | Admitting: Family Medicine

## 2018-06-03 ENCOUNTER — Other Ambulatory Visit: Payer: Self-pay | Admitting: Family Medicine

## 2018-06-03 NOTE — Telephone Encounter (Signed)
Unfortunately there has not been many antidepressants that have helped this patient  I would recommend a virtual visit early next week if she does not have a smart phone I would recommend regular phone If she needs a refill before then to let me know otherwise we can discuss all this next week

## 2018-06-03 NOTE — Telephone Encounter (Signed)
Left message to return call 

## 2018-06-03 NOTE — Telephone Encounter (Signed)
Patient said Dr. Nicki Reaper put her on lexapro and that hasn't helped her any.  She said she is having to take her Valium 3 times a day to compensate. (Which means she is going to run out of it more quickly).  She has some old prozac and is wondering if she can take that instead.  She is just really anxious and depressed right now (not suicidal) and cries a lot.  Also needs a refill on Nizoral cream because she said the tubes are so small and she shares with her husband.  [facility] on [address]

## 2018-06-06 NOTE — Telephone Encounter (Signed)
Pt has appt set for 06/07/2018 @10 :30

## 2018-06-07 ENCOUNTER — Other Ambulatory Visit: Payer: Self-pay

## 2018-06-07 ENCOUNTER — Ambulatory Visit (INDEPENDENT_AMBULATORY_CARE_PROVIDER_SITE_OTHER): Payer: PPO | Admitting: Family Medicine

## 2018-06-07 DIAGNOSIS — F320 Major depressive disorder, single episode, mild: Secondary | ICD-10-CM

## 2018-06-07 MED ORDER — FLUOXETINE HCL 20 MG PO TABS: ORAL_TABLET | ORAL | 5 refills | Status: DC

## 2018-06-07 MED ORDER — KETOCONAZOLE 2 % EX CREA: 1.0000 "application " | TOPICAL_CREAM | Freq: Every day | CUTANEOUS | 5 refills | Status: DC

## 2018-06-07 MED ORDER — DIAZEPAM 5 MG PO TABS: ORAL_TABLET | ORAL | 2 refills | Status: DC

## 2018-06-07 NOTE — Progress Notes (Signed)
   Subjective:    [name], female    DOB: 05/19/1940, 78 y.o.   MRN: [id number] Virtual visit Audio and visual Patient at home was at the office coronavirus outbreak  HPI Note    lexapro not helping any. taking Valium 3 times a day to compensate. She has some old prozac and is wondering if she can take that instead.  really anxious and depressed right now. not suicidal. cries a lot.    Also needs a refill on Nizoral cream. Would like to get a bigger tube if she can.     Patient with significant depression symptoms sadness crying nervousness related to the coronavirus outbreak staying at home try to do her self as best as possible protect her self she is tried numerous medicines in the past has not helped she has had a lot of trembling and shaking and therefore she has been taking Valium 3 times a day she denies it causing her drowsiness Virtual Visit via Video Note  I connected with Ruth [name] on 06/07/18 at 10:30 AM EDT by a video enabled telemedicine application and verified that I am speaking with the correct person using two identifiers.   I discussed the limitations of evaluation and management by telemedicine and the availability of in person appointments. The patient expressed understanding and agreed to proceed.  History of Present Illness:    Observations/Objective:   Assessment and Plan:   Follow Up Instructions:    I discussed the assessment and treatment plan with the patient. The patient was provided an opportunity to ask questions and all were answered. The patient agreed with the plan and demonstrated an understanding of the instructions.   The patient was advised to call back or seek an in-person evaluation if the symptoms worsen or if the condition fails to improve as anticipated.  I provided 18 minutes of non-face-to-face time during this encounter.    Patient not suicidal   Review of Systems     Objective:   Physical Exam        Assessment & Plan:  Significant discussion about patient's depression and anxiety Currently she is taking the Valium 3 times daily not causing her drowsiness this would be fine for now but patient has been cautioned that as things get better we will want to reduce this down to twice daily She is tried numerous antidepressants in the past without success she states in the past Prozac has helped so therefore we will go ahead with Prozac We will recheck with her again in 2 weeks

## 2018-06-19 ENCOUNTER — Other Ambulatory Visit: Payer: Self-pay | Admitting: Family Medicine

## 2018-06-21 ENCOUNTER — Telehealth: Payer: Self-pay | Admitting: Family Medicine

## 2018-06-21 ENCOUNTER — Other Ambulatory Visit: Payer: Self-pay | Admitting: Family Medicine

## 2018-06-21 ENCOUNTER — Other Ambulatory Visit: Payer: Self-pay

## 2018-06-21 ENCOUNTER — Encounter: Payer: Self-pay | Admitting: Family Medicine

## 2018-06-21 ENCOUNTER — Ambulatory Visit (INDEPENDENT_AMBULATORY_CARE_PROVIDER_SITE_OTHER): Payer: PPO | Admitting: Family Medicine

## 2018-06-21 VITALS — Wt 206.0 lb

## 2018-06-21 DIAGNOSIS — H0100B Unspecified blepharitis left eye, upper and lower eyelids: Secondary | ICD-10-CM | POA: Diagnosis not present

## 2018-06-21 DIAGNOSIS — H0100A Unspecified blepharitis right eye, upper and lower eyelids: Secondary | ICD-10-CM | POA: Diagnosis not present

## 2018-06-21 DIAGNOSIS — F320 Major depressive disorder, single episode, mild: Secondary | ICD-10-CM

## 2018-06-21 DIAGNOSIS — L03039 Cellulitis of unspecified toe: Secondary | ICD-10-CM

## 2018-06-21 DIAGNOSIS — L600 Ingrowing nail: Secondary | ICD-10-CM

## 2018-06-21 MED ORDER — CEPHALEXIN 500 MG PO CAPS: ORAL_CAPSULE | ORAL | 0 refills | Status: DC

## 2018-06-21 MED ORDER — OLOPATADINE HCL 0.2 % OP SOLN: OPHTHALMIC | 3 refills | Status: DC

## 2018-06-21 NOTE — Telephone Encounter (Signed)
Per Dr. Bary Leriche CC chart note - need to schedule pt VIRTUAL visit 07/05/2018 or 07/06/2018 for depression  [facility] for pt so we can schedule

## 2018-06-21 NOTE — Progress Notes (Signed)
   Subjective:    [name], female    DOB: [birth year], 78 y.o.   MRN: [id number]  Depression         This is a chronic problem.  The problem has been gradually worsening since onset.( Pt states she is feeling more down since being on Prozac. Pt states she has had thoughts of wondering why she is here. Pt states she has been sleeping better but does not sleep straight through the night)  Compliance with treatment is good.  Pt also states she has had some matting of the eyelashes and the whites of her eyes are red. Started about 3 days ago. Scratchy throat and runny nose, no fever. Pt has been using Rephresh eye drops and that gives some relief.   Pt states she has an ingrown toenail on left foot. Swollen and puffy. Husband did try to get it out but was not able to. Pt has put antibiotic ointment on toe.   Virtual Visit via Video Note  I connected with Ruth [name] on 06/21/18 at 11:00 AM EDT by a video enabled telemedicine application and verified that I am speaking with the correct person using two identifiers.  Location: Patient: Home Provider: Office   I discussed the limitations of evaluation and management by telemedicine and the availability of in person appointments. The patient expressed understanding and agreed to proceed.  History of Present Illness:    Observations/Objective:   Assessment and Plan:   Follow Up Instructions:    I discussed the assessment and treatment plan with the patient. The patient was provided an opportunity to ask questions and all were answered. The patient agreed with the plan and demonstrated an understanding of the instructions.   The patient was advised to call back or seek an in-person evaluation if the symptoms worsen or if the condition fails to improve as anticipated.  I provided 18 minutes of non-face-to-face time during this encounter.   Vicente Males, LPN    Review of Systems  Psychiatric/Behavioral:  Positive for depression.       Objective:   Physical Exam  Patient denies being suicidal      Assessment & Plan:  Severe depression Patient will bump up her Prozac 20 mg twice daily She would like to stick with this medicine for period of time to see if it will help She will use Valium very rarely She is agreeable to seeing psychiatry as long as it is on her insurance she would prefer virtual visits  Ingrown toenail with cellulitis recommend Keflex 4 times daily for 7 days  Blepharitis of the eyes warm soapy water allergy eyedrops follow-up if progressive troubles  Re-touch base with the patient in 2 weeks time

## 2018-06-21 NOTE — Telephone Encounter (Signed)
The prescription was sent in-just now

## 2018-06-21 NOTE — Telephone Encounter (Signed)
Eye drops haven't been sent to pharmacy   We did order antibiotics  Please send eye drop order to [facility]   Please advise and call pt when done

## 2018-06-21 NOTE — Telephone Encounter (Signed)
Patient notified and verbalized understanding. 

## 2018-06-22 NOTE — Progress Notes (Signed)
Left message to return call 

## 2018-06-23 ENCOUNTER — Telehealth: Payer: Self-pay | Admitting: *Deleted

## 2018-06-23 ENCOUNTER — Other Ambulatory Visit: Payer: Self-pay | Admitting: *Deleted

## 2018-06-23 DIAGNOSIS — F419 Anxiety disorder, unspecified: Secondary | ICD-10-CM

## 2018-06-23 DIAGNOSIS — M25569 Pain in unspecified knee: Secondary | ICD-10-CM

## 2018-06-23 NOTE — Telephone Encounter (Signed)
I am fine with local psychiatry.  Other option would be [facility] If any offices are doing virtual visits for this that would be helpful

## 2018-06-23 NOTE — Telephone Encounter (Signed)
Called pt about another message for referral for pt for knee and she said to go ahead and schedule for June. Did not want to go out this month. Referral was put in for pt and pt also said to go ahead and do referral for psych that her and dr scott talked about. She states she know it will take a few months to get in and would like to get process started. Ok to put in referral and is there anybody in particular you wanted to send her to. Pt did not have a preference.

## 2018-06-23 NOTE — Telephone Encounter (Signed)
Referral put in. Pt was notified.

## 2018-06-23 NOTE — Progress Notes (Signed)
Discussed with pt. And referral put in

## 2018-06-28 ENCOUNTER — Telehealth (HOSPITAL_COMMUNITY): Payer: Self-pay | Admitting: Physical Therapy

## 2018-06-28 NOTE — Telephone Encounter (Signed)
Pt was contacted today by phone regarding resuming therapy services following our temporary closure secondary to COVID-19.    Unable to reach patient by phone, however left voicemail notifying patient of clinic reopening and to return call with intent.  Pt may be ready for discharge at this point.    Teena Irani, PTA/CLT [phone]

## 2018-06-30 ENCOUNTER — Telehealth (HOSPITAL_COMMUNITY): Payer: Self-pay | Admitting: Family Medicine

## 2018-06-30 NOTE — Telephone Encounter (Signed)
06/30/18  Pt called to say she hadn't received the message that I left earlier in the day.  She said to go ahead and discharge her

## 2018-06-30 NOTE — Telephone Encounter (Signed)
06/30/18  Ms. Birkey returned Amy's call from 5/13 and the message she left said that she does want to come back just unsure as to when.  I told her that was fine that I would pass along the message to Amy and they would discharge her and when she was ready to return to call the office.

## 2018-07-01 NOTE — Progress Notes (Deleted)
Office Visit Note  Patient: Ruth [name]             Date of Birth: 10/08/1940           MRN: [id number]             PCP: Kathyrn Drown, MD Referring: Kathyrn Drown, MD Visit Date: 07/15/2018 Occupation: @GUAROCC @  Subjective:  No chief complaint on file.   History of Present Illness: Ruth [name] is a 78 y.o. female ***   Activities of Daily Living:  Patient reports morning stiffness for *** {minute/hour:19697}.   Patient {ACTIONS;DENIES/REPORTS:21021675::"Denies"} nocturnal pain.  Difficulty dressing/grooming: {ACTIONS;DENIES/REPORTS:21021675::"Denies"} Difficulty climbing stairs: {ACTIONS;DENIES/REPORTS:21021675::"Denies"} Difficulty getting out of chair: {ACTIONS;DENIES/REPORTS:21021675::"Denies"} Difficulty using hands for taps, buttons, cutlery, and/or writing: {ACTIONS;DENIES/REPORTS:21021675::"Denies"}  No Rheumatology ROS completed.   PMFS History:  Patient Active Problem List   Diagnosis Date Noted  . Depression, major, single episode, mild ([facility]) 03/07/2018  . OA (osteoarthritis) of knee 02/07/2018  . Other allergic contact dermatitis 12/20/2017  . Non-allergic rhinitis 12/16/2017  . Allergy to metal 12/16/2017  . Gastroesophageal reflux disease without esophagitis 07/15/2017  . Primary osteoarthritis of both feet 04/29/2017  . Family history of early CAD 04/15/2016  . Primary osteoarthritis of both knees 12/31/2015  . Chest pain 07/13/2015  . Atypical chest pain 07/13/2015  . Major depression 03/19/2015  . Vulvodynia 08/03/2014  . Functional constipation 08/03/2014  . Anxiety 07/06/2014  . Fibromyalgia 04/25/2014  . Snoring 04/22/2014  . Prediabetes 03/18/2014  . Hyperlipidemia 03/18/2014  . Osteopenia 03/06/2014  . History of cardiac catheterization 04/28/2013  . Dyspepsia 02/02/2012  . GERD (gastroesophageal reflux disease) 02/02/2012  . Depression 06/18/2011  . Dysphagia 02/05/2011  . CHEST PAIN 02/11/2009  . GASTROESOPHAGEAL REFLUX  DISEASE, CHRONIC 02/15/2007  . IRRITABLE BOWEL SYNDROME 02/15/2007  . Primary osteoarthritis of both hands 02/15/2007  . Reflux esophagitis 02/14/2007  . GASTRITIS 02/14/2007    Past Medical History:  Diagnosis Date  . Anxiety   . Arthritis   . Asthma   . Depression   . Eczema   . Fibromyalgia   . GERD (gastroesophageal reflux disease)    EGD/ colon 1/09  . H pylori ulcer 1980-1990   s/p treatment  . Headache   . Hyperlipidemia   . IBS (irritable bowel syndrome)   . Laryngospasm   . Neck pain   . OSA (obstructive sleep apnea)   . PONV (postoperative nausea and vomiting)   . Refusal of blood transfusions as patient is Jehovah's Witness   . Vertigo     Family History  Problem Relation Age of Onset  . Diabetes Father   . Heart attack Father        63's  . Lung cancer Father   . Cirrhosis Father 53       etoh cirrhosis  . Eczema Father   . Lung cancer Sister   . Depression Sister   . Alzheimer's disease Sister   . Hypertension Sister   . Diabetes Sister   . Lung cancer Sister   . Paranoid behavior Mother        depression  . Colon polyps Mother   . Eczema Mother   . Allergic rhinitis Neg Hx   . Asthma Neg Hx   . Urticaria Neg Hx    Past Surgical History:  Procedure Laterality Date  . ABDOMINAL HYSTERECTOMY  1970  . APPENDECTOMY    . BILATERAL SALPINGOOPHORECTOMY  1990s  . CARDIAC CATHETERIZATION  2002   (  [facility]) normal coronary arteries   . COLONOSCOPY  01/2007   Dr. Meriel Flavors  . COLONOSCOPY N/A 04/18/2015   Procedure: COLONOSCOPY;  Surgeon: Rogene Houston, MD;  Location: [facility];  Service: Endoscopy;  Laterality: N/A;  830 - moved to 8:55 - Ann to notify pt  . COLONOSCOPY WITH ESOPHAGOGASTRODUODENOSCOPY (EGD)  02/16/2012   Procedure: COLONOSCOPY WITH ESOPHAGOGASTRODUODENOSCOPY (EGD);  Surgeon: Daneil Dolin, MD;  Location: [facility];  Service: Endoscopy;  Laterality: N/A;  8:45  . ESOPHAGEAL DILATION N/A 06/07/2014   Procedure: ESOPHAGEAL  DILATION;  Surgeon: Rogene Houston, MD;  Location: [facility];  Service: Endoscopy;  Laterality: N/A;  . ESOPHAGOGASTRODUODENOSCOPY  01/2007   Dr Sharlett Iles- 3 cm hiatal hernia, benign esophageal biopsies, erosive esophagitis, gastritis  . ESOPHAGOGASTRODUODENOSCOPY N/A 06/07/2014   Procedure: ESOPHAGOGASTRODUODENOSCOPY (EGD);  Surgeon: Rogene Houston, MD;  [facility]: [facility];  Service: Endoscopy;  Laterality: N/A;  1200  . ESOPHAGOGASTRODUODENOSCOPY N/A 08/18/2017   Procedure: ESOPHAGOGASTRODUODENOSCOPY (EGD);  Surgeon: Rogene Houston, MD;  Location: [facility];  Service: Endoscopy;  Laterality: N/A;  2:20  . FOOT SURGERY    . INNER EAR SURGERY     Left  . KNEE SURGERY     Multiple  . SHOULDER SURGERY  2011  . TONSILLECTOMY    . TOTAL KNEE ARTHROPLASTY Left 02/07/2018   Procedure: TOTAL KNEE ARTHROPLASTY;  Surgeon: Gaynelle Arabian, MD;  Location: [facility];  Service: Orthopedics;  Laterality: Left;  94min  . TUBAL LIGATION    . TYMPANOSTOMY TUBE PLACEMENT     Social History   Social History Narrative   Married   2 grown children, 1 adopted son-autistic-lives next [employer] History  Administered Date(s) Administered  . Influenza,inj,Quad PF,6+ Mos 12/15/2017  . Influenza-Unspecified 01/16/2015, 11/22/2015  . Pneumococcal Conjugate-13 02/15/2014  . Zoster Recombinat (Shingrix) 07/10/2016     Objective: Vital Signs: There were no vitals taken for this visit.   Physical Exam   Musculoskeletal Exam: ***  CDAI Exam: CDAI Score: Not documented Patient Global Assessment: Not documented; Provider Global Assessment: Not documented Swollen: Not documented; Tender: Not documented Joint Exam   Not documented   There is currently no information documented on the homunculus. Go to the Rheumatology activity and complete the homunculus joint exam.  Investigation: No additional findings.  Imaging: No results found.  Recent Labs: Lab Results  Component Value  Date   WBC 10.3 02/09/2018   HGB 11.7 (L) 02/09/2018   PLT 249 02/09/2018   NA 137 02/09/2018   K 3.8 02/09/2018   CL 99 02/09/2018   CO2 28 02/09/2018   GLUCOSE 130 (H) 02/09/2018   BUN 19 02/09/2018   CREATININE 0.79 02/09/2018   BILITOT 0.4 02/03/2018   ALKPHOS 64 02/03/2018   AST 18 02/03/2018   ALT 17 02/03/2018   PROT 7.0 02/03/2018   ALBUMIN 4.0 02/03/2018   CALCIUM 8.8 (L) 02/09/2018   GFRAA >60 02/09/2018    Speciality Comments: No specialty comments available.  Procedures:  No procedures performed Allergies: Blood-group specific substance; Ciprofloxacin; Codeine; Latex; Vicodin [hydrocodone-acetaminophen]; and Tramadol   Assessment / Plan:     Visit Diagnoses: No diagnosis found.   Orders: No orders of the defined types were placed in this encounter.  No orders of the defined types were placed in this encounter.   Face-to-face time spent with patient was *** minutes. Greater than 50% of time was spent in counseling and coordination of care.  Follow-Up Instructions: No follow-ups on file.   Ofilia Neas, PA-C  Note - This record has been created using Dragon software.  Chart creation errors have been sought, but may not always  have been located. Such creation errors do not reflect on  the standard of medical care.

## 2018-07-05 ENCOUNTER — Ambulatory Visit (INDEPENDENT_AMBULATORY_CARE_PROVIDER_SITE_OTHER): Payer: PPO | Admitting: Family Medicine

## 2018-07-05 ENCOUNTER — Other Ambulatory Visit: Payer: Self-pay

## 2018-07-05 DIAGNOSIS — F320 Major depressive disorder, single episode, mild: Secondary | ICD-10-CM | POA: Diagnosis not present

## 2018-07-05 DIAGNOSIS — F419 Anxiety disorder, unspecified: Secondary | ICD-10-CM

## 2018-07-05 NOTE — Progress Notes (Signed)
   Subjective:  Telephone visit video failed  [name], female    DOB: November 26, 1940, 78 y.o.   MRN: [id number] Video not possible patient's phone malfunction telephone only Depression         This is a recurrent problem.  Associated symptoms include no appetite change.  Pt states that she is about the same. Pt states her shaking is getting worse. Pt states that she has good days and bad days. Pt states her toe is better. Pt is sleeping well the majority of the time. Most times she does not sleep all night. Pt states that she is not sure if the Prozac is making her nervous or what it is. Pt would like to speak with provider about coming off the anti depressant. Pt states with the increase of Prozac, she has had upset stomach.  Had thorough discussion with the patient regarding her depression.  She is interested in seeing a psychiatrist.  Because of side effects with Prozac she will reduce it down to 1/day she will let us know if she has any ongoing troubles otherwise. Virtual Visit via Video Note  I connected with Ruth [name] on 07/05/18 at 11:00 AM EDT by a video enabled telemedicine application and verified that I am speaking with the correct person using two identifiers.  Location: Patient: home Provider: office   I discussed the limitations of evaluation and management by telemedicine and the availability of in person appointments. The patient expressed understanding and agreed to proceed.  History of Present Illness:    Observations/Objective:   Assessment and Plan:   Follow Up Instructions:    I discussed the assessment and treatment plan with the patient. The patient was provided an opportunity to ask questions and all were answered. The patient agreed with the plan and demonstrated an understanding of the instructions.   The patient was advised to call back or seek an in-person evaluation if the symptoms worsen or if the condition fails to improve as  anticipated.  I provided 15 minutes of non-face-to-face time during this encounter.   Vicente Males, LPN    Review of Systems  Constitutional: Negative for activity change and appetite change.  HENT: Negative for congestion and rhinorrhea.   Respiratory: Negative for cough and shortness of breath.   Cardiovascular: Negative for chest pain and leg swelling.  Gastrointestinal: Negative for abdominal pain, nausea and vomiting.  Skin: Negative for color change.  Neurological: Negative for dizziness and weakness.  Psychiatric/Behavioral: Positive for depression. Negative for agitation and confusion.       Objective:   Physical Exam   Unable to do physical exam via telephone     Assessment & Plan:  Significant depression but having side effects with Prozac she will drop it down to 1 a day we will touch base with her again in 2 weeks on how she is doing we will reinitiate the referral to try to get her in with psychiatry I think that would be beneficial for her.  She may continue the Valium 3 times a day for now but long-term I really do not think that is a good idea she understands

## 2018-07-07 DIAGNOSIS — M1711 Unilateral primary osteoarthritis, right knee: Secondary | ICD-10-CM | POA: Diagnosis not present

## 2018-07-07 DIAGNOSIS — R251 Tremor, unspecified: Secondary | ICD-10-CM | POA: Diagnosis not present

## 2018-07-07 DIAGNOSIS — Z471 Aftercare following joint replacement surgery: Secondary | ICD-10-CM | POA: Diagnosis not present

## 2018-07-07 DIAGNOSIS — Z96652 Presence of left artificial knee joint: Secondary | ICD-10-CM | POA: Diagnosis not present

## 2018-07-08 LAB — BASIC METABOLIC PANEL
BUN/Creatinine Ratio: 22 (ref 12–28)
BUN: 17 mg/dL (ref 8–27)
CO2: 24 mmol/L (ref 20–29)
Calcium: 9.8 mg/dL (ref 8.7–10.3)
Chloride: 100 mmol/L (ref 96–106)
Creatinine, Ser: 0.79 mg/dL (ref 0.57–1.00)
GFR calc Af Amer: 84 mL/min/{1.73_m2} (ref >59)
GFR calc non Af Amer: 73 mL/min/{1.73_m2} (ref >59)
Glucose: 138 mg/dL — ABNORMAL HIGH (ref 65–99)
Potassium: 4.8 mmol/L (ref 3.5–5.2)
Sodium: 138 mmol/L (ref 134–144)

## 2018-07-08 LAB — MAGNESIUM: Magnesium: 2.3 mg/dL (ref 1.6–2.3)

## 2018-07-08 LAB — VITAMIN D 25 HYDROXY (VIT D DEFICIENCY, FRACTURES): Vit D, 25-Hydroxy: 30.1 ng/mL (ref 30.0–100.0)

## 2018-07-08 LAB — TSH: TSH: 1.95 u[IU]/mL (ref 0.450–4.500)

## 2018-07-08 LAB — T4, FREE: Free T4: 1.1 ng/dL (ref 0.82–1.77)

## 2018-07-11 NOTE — Telephone Encounter (Signed)
This has been filled via other means

## 2018-07-12 ENCOUNTER — Other Ambulatory Visit (HOSPITAL_COMMUNITY): Payer: Self-pay | Admitting: Family Medicine

## 2018-07-12 DIAGNOSIS — Z1231 Encounter for screening mammogram for malignant neoplasm of breast: Secondary | ICD-10-CM

## 2018-07-15 ENCOUNTER — Ambulatory Visit: Payer: Self-pay | Admitting: Rheumatology

## 2018-07-20 ENCOUNTER — Ambulatory Visit (INDEPENDENT_AMBULATORY_CARE_PROVIDER_SITE_OTHER): Payer: PPO | Admitting: Family Medicine

## 2018-07-20 ENCOUNTER — Other Ambulatory Visit: Payer: Self-pay

## 2018-07-20 DIAGNOSIS — F320 Major depressive disorder, single episode, mild: Secondary | ICD-10-CM | POA: Diagnosis not present

## 2018-07-20 DIAGNOSIS — F419 Anxiety disorder, unspecified: Secondary | ICD-10-CM | POA: Diagnosis not present

## 2018-07-20 NOTE — Progress Notes (Signed)
Patient aware we have replaced the referral in Epic and to await a call from our office to give her the details. She is aware that if she has not heard back from [place] in a few days to call back and inquire regarding this.

## 2018-07-20 NOTE — Progress Notes (Signed)
   Subjective:    [name], female    DOB: 28-Sep-1940, 78 y.o.   MRN: [id number] Phone visit only video did not work HPIFollow up on bloodwork results.  I reviewed over lab work with the patient overall they are stable I do not find evidence that this is causing any of her problems  She does state she is using the Valium 2 or 3 times a day denies being drugged by but it does help her with her anxiety She is taking her antidepressant on a regular basis and denies any setbacks with it states is helping some but not a lot she is still interested in seeing psychiatry but no one is connected with her yet Depression is some better. Not as shaky as she was but she states she feels dull.   Virtual Visit via Video Note  I connected with Ruth [name] on 07/20/18 at 11:00 AM EDT by a video enabled telemedicine application and verified that I am speaking with the correct person using two identifiers. She denies being suicidal Location: Patient: home Provider: office   I discussed the limitations of evaluation and management by telemedicine and the availability of in person appointments. The patient expressed understanding and agreed to proceed.  History of Present Illness:    Observations/Objective:   Assessment and Plan:   Follow Up Instructions:    I discussed the assessment and treatment plan with the patient. The patient was provided an opportunity to ask questions and all were answered. The patient agreed with the plan and demonstrated an understanding of the instructions.   The patient was advised to call back or seek an in-person evaluation if the symptoms worsen or if the condition fails to improve as anticipated.  I provided 15 minutes of non-face-to-face time during this encounter.       Review of Systems  Constitutional: Negative for activity change and appetite change.  HENT: Negative for congestion and rhinorrhea.   Respiratory: Negative for cough  and shortness of breath.   Cardiovascular: Negative for chest pain and leg swelling.  Gastrointestinal: Negative for abdominal pain, nausea and vomiting.  Skin: Negative for color change.  Neurological: Negative for dizziness and weakness.  Psychiatric/Behavioral: Negative for agitation and confusion.       Objective:   Physical Exam   Today's visit was via telephone Physical exam was not possible for this visit      Assessment & Plan:  Depression continue the antidepressant Use the nerve medicine as needed If possible try to keep it to her last I still recommend psychiatry.  Hopefully they can do virtual visit with her  Recent lab work looks good and stable

## 2018-07-20 NOTE — Addendum Note (Signed)
Addended by: Karle Barr on: 07/20/2018 01:15 PM   Modules accepted: Orders

## 2018-07-20 NOTE — Progress Notes (Signed)
Referral placed. Need to call pt to notify.

## 2018-07-21 ENCOUNTER — Encounter: Payer: Self-pay | Admitting: Family Medicine

## 2018-08-03 ENCOUNTER — Telehealth: Payer: Self-pay | Admitting: Family Medicine

## 2018-08-03 NOTE — Telephone Encounter (Signed)
Pt's husband is calling stating insurance is now charging $90 for FLUoxetine (PROZAC) 20 MG tablet. They want her to try 3 other medications first which he states she has tried two of them already (celebrex and lexepro) the third they want her to try is fluvoxamine which he states Dr. Nicki Reaper and her talked about and agreed to try the fluoxetine.   He states if a PA is done it will need to [facility] day supply.

## 2018-08-05 NOTE — Telephone Encounter (Signed)
Please communicate with patient/husband I reviewed over this Unfortunately there are insurance company has put them in a difficult predicament I would not recommend fluvoxamine The patient did not do well with Celebrex and Lexapro if I recall correctly We can do a appeal if they would like for [place] to do so There is a possibility that this appeal will get rejected but not mind trying if they would like for [place] to do so Find out from the family how they would like for [place] to proceed  Nurses-on a separate note we have been trying to refer this patient to psychiatry for almost 2 months please see what we can do that having psychiatry respond and please help this patient because her condition is really would benefit from a psychiatrist

## 2018-08-05 NOTE — Telephone Encounter (Signed)
Contacted pt husband. Pt husband states that he would like for [place] to do an appeal on the Fluoxetine. Pt states that insurance states that if we do an appeal, the drug can be put into a Tier 3 which would make it about half the price. Pt states the Fluoxetine would need to go to [facility]. Also, pt has appt with D. Hisada on 08/23/2018.

## 2018-08-15 NOTE — Progress Notes (Signed)
Office Visit Note  Patient: Ruth [name]             Date of Birth: November 28, 1940           MRN: [id number]             PCP: Kathyrn Drown, MD Referring: Kathyrn Drown, MD Visit Date: 08/29/2018 Occupation: @GUAROCC @  Subjective:  Tremors and generalized pain.   History of Present Illness: Ruth [name] is a 78 y.o. female with history of fibromyalgia and osteoarthritis.  She states her fibromyalgia was very well controlled and she decided to undergo left total knee replacement.  She had left total knee replacement in December 2019 by Dr. Maureen Ralphs.  She continues to have discomfort in her left knee joint.  She also has limited flexion.  She states since her surgery she has been experiencing tremors all over her body.  She was not sure if it was related to fibromyalgia.  She has been having increased pain from fibromyalgia as well.  She states magnesium helps with the tremors to some extent.  She has been also experiencing some hair loss.  She states her PCP did thyroid functions which were normal.  Activities of Daily Living:  Patient reports morning stiffness for 24 hours.   Patient Reports nocturnal pain.  Difficulty dressing/grooming: Reports Difficulty climbing stairs: Reports Difficulty getting out of chair: Reports Difficulty using hands for taps, buttons, cutlery, and/or writing: Reports  Review of Systems  Constitutional: Positive for fatigue. Negative for night sweats, weight gain and weight loss.  HENT: Positive for mouth sores. Negative for trouble swallowing, trouble swallowing, mouth dryness and nose dryness.   Eyes: Positive for dryness. Negative for pain, redness, itching and visual disturbance.  Respiratory: Negative for cough, shortness of breath, wheezing and difficulty breathing.   Cardiovascular: Negative for chest pain, palpitations, hypertension, irregular heartbeat and swelling in legs/feet.  Gastrointestinal: Positive for constipation and diarrhea. Negative  for abdominal pain and blood in stool.  Endocrine: Negative for increased urination.  Genitourinary: Negative for painful urination, pelvic pain and vaginal dryness.  Musculoskeletal: Positive for arthralgias, gait problem, joint pain, joint swelling and morning stiffness. Negative for myalgias, muscle weakness, muscle tenderness and myalgias.  Skin: Positive for hair loss. Negative for color change, rash, skin tightness, ulcers and sensitivity to sunlight.  Allergic/Immunologic: Negative for susceptible to infections.  Neurological: Positive for tremors, headaches and weakness. Negative for dizziness, light-headedness, memory loss and night sweats.  Hematological: Negative for bruising/bleeding tendency and swollen glands.  Psychiatric/Behavioral: Negative for depressed mood, confusion and sleep disturbance. The patient is not nervous/anxious.     PMFS History:  Patient Active Problem List   Diagnosis Date Noted  . Depression, major, single episode, mild ([place]) 03/07/2018  . OA (osteoarthritis) of knee 02/07/2018  . Other allergic contact dermatitis 12/20/2017  . Non-allergic rhinitis 12/16/2017  . Allergy to metal 12/16/2017  . Gastroesophageal reflux disease without esophagitis 07/15/2017  . Primary osteoarthritis of both feet 04/29/2017  . Family history of early CAD 04/15/2016  . Primary osteoarthritis of both knees 12/31/2015  . Chest pain 07/13/2015  . Atypical chest pain 07/13/2015  . Major depression 03/19/2015  . Vulvodynia 08/03/2014  . Functional constipation 08/03/2014  . Anxiety 07/06/2014  . Fibromyalgia 04/25/2014  . Snoring 04/22/2014  . Prediabetes 03/18/2014  . Hyperlipidemia 03/18/2014  . Osteopenia 03/06/2014  . History of cardiac catheterization 04/28/2013  . Dyspepsia 02/02/2012  . GERD (gastroesophageal reflux disease) 02/02/2012  . Depression  06/18/2011  . Dysphagia 02/05/2011  . CHEST PAIN 02/11/2009  . GASTROESOPHAGEAL REFLUX DISEASE, CHRONIC  02/15/2007  . IRRITABLE BOWEL SYNDROME 02/15/2007  . Primary osteoarthritis of both hands 02/15/2007  . Reflux esophagitis 02/14/2007  . GASTRITIS 02/14/2007    Past Medical History:  Diagnosis Date  . Anxiety   . Arthritis   . Asthma   . Depression   . Eczema   . Fibromyalgia   . GERD (gastroesophageal reflux disease)    EGD/ colon 1/09  . H pylori ulcer 1980-1990   s/p treatment  . Headache   . Hyperlipidemia   . IBS (irritable bowel syndrome)   . Laryngospasm   . Neck pain   . OSA (obstructive sleep apnea)   . PONV (postoperative nausea and vomiting)   . Refusal of blood transfusions as patient is Jehovah's Witness   . Vertigo     Family History  Problem Relation Age of Onset  . Diabetes Father   . Heart attack Father        88's  . Lung cancer Father   . Cirrhosis Father 92       etoh cirrhosis  . Eczema Father   . Lung cancer Sister   . Depression Sister   . Alzheimer's disease Sister   . Hypertension Sister   . Diabetes Sister   . Lung cancer Sister   . Paranoid behavior Mother        depression  . Colon polyps Mother   . Eczema Mother   . COPD Daughter   . Allergic rhinitis Neg Hx   . Asthma Neg Hx   . Urticaria Neg Hx    Past Surgical History:  Procedure Laterality Date  . ABDOMINAL HYSTERECTOMY  1970  . APPENDECTOMY    . BILATERAL SALPINGOOPHORECTOMY  1990s  . CARDIAC CATHETERIZATION  2002   [facility]) normal coronary arteries   . COLONOSCOPY  01/2007   Dr. Meriel Flavors  . COLONOSCOPY N/A 04/18/2015   Procedure: COLONOSCOPY;  Surgeon: Rogene Houston, MD;  [facility];  Service: Endoscopy;  Laterality: N/A;  830 - moved to 8:55 - Ann to notify pt  . COLONOSCOPY WITH ESOPHAGOGASTRODUODENOSCOPY (EGD)  02/16/2012   Procedure: COLONOSCOPY WITH ESOPHAGOGASTRODUODENOSCOPY (EGD);  Surgeon: Daneil Dolin, MD;  [facility];  Service: Endoscopy;  Laterality: N/A;  8:45  . ESOPHAGEAL DILATION N/A 06/07/2014   Procedure: ESOPHAGEAL  DILATION;  Surgeon: Rogene Houston, MD;  [facility];  Service: Endoscopy;  Laterality: N/A;  . ESOPHAGOGASTRODUODENOSCOPY  01/2007   Dr Sharlett Iles- 3 cm hiatal hernia, benign esophageal biopsies, erosive esophagitis, gastritis  . ESOPHAGOGASTRODUODENOSCOPY N/A 06/07/2014   Procedure: ESOPHAGOGASTRODUODENOSCOPY (EGD);  Surgeon: Rogene Houston, MD;  [facility];  Service: Endoscopy;  Laterality: N/A;  1200  . ESOPHAGOGASTRODUODENOSCOPY N/A 08/18/2017   Procedure: ESOPHAGOGASTRODUODENOSCOPY (EGD);  Surgeon: Rogene Houston, MD;  [facility];  Service: Endoscopy;  Laterality: N/A;  2:20  . FOOT SURGERY    . INNER EAR SURGERY     Left  . KNEE SURGERY     Multiple  . SHOULDER SURGERY  2011  . TONSILLECTOMY    . TOTAL KNEE ARTHROPLASTY Left 02/07/2018   Procedure: TOTAL KNEE ARTHROPLASTY;  Surgeon: Gaynelle Arabian, MD;  [facility];  Service: Orthopedics;  Laterality: Left;  59min  . TUBAL LIGATION    . TYMPANOSTOMY TUBE PLACEMENT     Social History   Social History Narrative  Married   2 grown children, 1 adopted son-autistic-lives next [employer] History  Administered Date(s) Administered  . Influenza,inj,Quad PF,6+ Mos 12/15/2017  . Influenza-Unspecified 01/16/2015, 11/22/2015  . Pneumococcal Conjugate-13 02/15/2014  . Zoster Recombinat (Shingrix) 07/10/2016     Objective: Vital Signs: BP (!) 142/77 (BP Location: Right Arm, Patient Position: Sitting, Cuff Size: Normal)   Pulse 75   Resp 15   Ht 5\' 4"  (1.626 m)   Wt 208 lb 9.6 oz (94.6 kg)   BMI 35.81 kg/m    Physical Exam Vitals signs and nursing note reviewed.  Constitutional:      Appearance: She is well-developed.  HENT:     Head: Normocephalic and atraumatic.  Eyes:     Conjunctiva/sclera: Conjunctivae normal.  Neck:     Musculoskeletal: Normal range of motion.  Cardiovascular:     Rate and Rhythm: Normal rate and regular rhythm.     Heart sounds: Normal heart  sounds.  Pulmonary:     Effort: Pulmonary effort is normal.     Breath sounds: Normal breath sounds.  Abdominal:     General: Bowel sounds are normal.     Palpations: Abdomen is soft.  Lymphadenopathy:     Cervical: No cervical adenopathy.  Skin:    General: Skin is warm and dry.     Capillary Refill: Capillary refill takes less than 2 seconds.  Neurological:     Mental Status: She is alert and oriented to person, place, and time.     Comments: Coarse tremors noted in bilateral upper extremities.  Psychiatric:        Behavior: Behavior normal.      Musculoskeletal Exam: Patient has limited range of motion of her cervical spine with some discomfort.  Shoulder joints and elbow joints with good range of motion.  She has PIP and DIP thickening in her hands and feet consistent with osteoarthritis.  She is crepitus in her right knee joint with limited extension consistent with osteoarthritis.  She had left total knee replacement which was warm to touch.  CDAI Exam: CDAI Score: - Patient Global: -; Provider Global: - Swollen: -; Tender: - Joint Exam   No joint exam has been documented for this visit   There is currently no information documented on the homunculus. Go to the Rheumatology activity and complete the homunculus joint exam.  Investigation: No additional findings.  Imaging: No results found.  Recent Labs: Lab Results  Component Value Date   WBC 10.3 02/09/2018   HGB 11.7 (L) 02/09/2018   PLT 249 02/09/2018   NA 138 07/07/2018   K 4.8 07/07/2018   CL 100 07/07/2018   CO2 24 07/07/2018   GLUCOSE 138 (H) 07/07/2018   BUN 17 07/07/2018   CREATININE 0.79 07/07/2018   BILITOT 0.4 02/03/2018   ALKPHOS 64 02/03/2018   AST 18 02/03/2018   ALT 17 02/03/2018   PROT 7.0 02/03/2018   ALBUMIN 4.0 02/03/2018   CALCIUM 9.8 07/07/2018   GFRAA 84 07/07/2018    Speciality Comments: No specialty comments available.  Procedures:  No procedures performed Allergies:  Blood-group specific substance, Ciprofloxacin, Codeine, Latex, Vicodin [hydrocodone-acetaminophen], and Tramadol   Assessment / Plan:     Visit Diagnoses: Fibromyalgia -she continues to have some generalized pain discomfort and positive tender points.  She has been having a flare of fibromyalgia symptoms.  Coarse tremors -I am uncertain about the etiology of tremors.  She denies any family history of tremors.  She had recent  TSH which was normal.  She is awaiting neurological evaluation.  Primary osteoarthritis of both hands - Plan: Joint protection was discussed.  Primary osteoarthritis of right knee - Plan: She has limited extension.  She states she is not ready for total knee replacement.  Status post total knee replacement, left - December 2019 by Dr. Maureen Ralphs - Plan: She continues to have pain and discomfort in her left knee.  Primary osteoarthritis of both feet - She is seen Dr. Doran Durand for osteoarthritis in her feet  Primary insomnia   Other fatigue   Osteopenia of multiple sites - Plan: Use of calcium and vitamin D was discussed.  Orders: No orders of the defined types were placed in this encounter.  No orders of the defined types were placed in this encounter.    Follow-Up Instructions: Return in about 1 year (around 08/29/2019), or if symptoms worsen or fail to improve, for FMS, OA.   Bo Merino, MD  Note - This record has been created using Editor, commissioning.  Chart creation errors have been sought, but may not always  have been located. Such creation errors do not reflect on  the standard of medical care.
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
## 2018-08-23 ENCOUNTER — Ambulatory Visit (HOSPITAL_COMMUNITY): Payer: Self-pay | Admitting: Psychiatry

## 2018-08-25 ENCOUNTER — Other Ambulatory Visit: Payer: Self-pay

## 2018-08-25 ENCOUNTER — Ambulatory Visit (INDEPENDENT_AMBULATORY_CARE_PROVIDER_SITE_OTHER): Payer: PPO | Admitting: Family Medicine

## 2018-08-25 ENCOUNTER — Encounter: Payer: Self-pay | Admitting: Family Medicine

## 2018-08-25 DIAGNOSIS — F320 Major depressive disorder, single episode, mild: Secondary | ICD-10-CM

## 2018-08-25 DIAGNOSIS — R251 Tremor, unspecified: Secondary | ICD-10-CM | POA: Diagnosis not present

## 2018-08-25 DIAGNOSIS — I889 Nonspecific lymphadenitis, unspecified: Secondary | ICD-10-CM

## 2018-08-25 MED ORDER — FLUOXETINE HCL 20 MG PO TABS: ORAL_TABLET | ORAL | 2 refills | Status: DC

## 2018-08-25 MED ORDER — AMOXICILLIN 500 MG PO CAPS: ORAL_CAPSULE | ORAL | 0 refills | Status: DC

## 2018-08-25 NOTE — Progress Notes (Signed)
Subjective:    [name], female    DOB: 1940-08-24, 78 y.o.   MRN: [id number]  HPI Pt states that her depression is not better. Pt is still having some shakes. Pt states that she is also having some tenderness in glands on both sides of neck. Pt is taking Prozac 20 mg BID. Patient relates that she has tremors that come and go sometimes legs sometimes arms they can make it difficult for her to do things with her hands sometimes her writing is very shaky she states if she uses a Valium a couple times a day it does help keep this calm she has had a lot of stress stress and anxiety but she is worried about the possibility of a developing neurologic issue  25 minutes was spent with the patient.  This statement verifies that 25 minutes was indeed spent with the patient.  More than 50% of this visit-total duration of the visit-was spent in counseling and coordination of care. The issues that the patient came in for today as reflected in the diagnosis (s) please refer to documentation for further details.  Virtual Visit via Video Note  I connected with Ruth [name] on 08/25/18 at  1:10 PM EDT by a video enabled telemedicine application and verified that I am speaking with the correct person using two identifiers.  Location: Patient: home Provider: office   I discussed the limitations of evaluation and management by telemedicine and the availability of in person appointments. The patient expressed understanding and agreed to proceed.  History of Present Illness:    Observations/Objective:   Assessment and Plan:   Follow Up Instructions:    I discussed the assessment and treatment plan with the patient. The patient was provided an opportunity to ask questions and all were answered. The patient agreed with the plan and demonstrated an understanding of the instructions.   The patient was advised to call back or seek an in-person evaluation if the symptoms worsen or if  the condition fails to improve as anticipated.  I provided 15 minutes of non-face-to-face time during this encounter.   Vicente Males, LPN    Review of Systems  Constitutional: Negative for activity change and appetite change.  HENT: Negative for congestion and rhinorrhea.   Respiratory: Negative for cough and shortness of breath.   Cardiovascular: Negative for chest pain and leg swelling.  Gastrointestinal: Negative for abdominal pain, nausea and vomiting.  Skin: Negative for color change.  Neurological: Positive for tremors. Negative for dizziness and weakness.  Psychiatric/Behavioral: Negative for agitation and confusion.       Objective:   Physical Exam  Patient had virtual visit Appears to be in no distress Atraumatic Neuro able to relate and oriented No apparent resp distress Color normal       Assessment & Plan:  Patient having significant stress related issues at times feels depressed about what is going on with issues in [place]  Given her depression she will continue on the Prozac twice daily she will also see psychiatry in a few weeks time and then she will touch base with [place] in a month This patient has tried numerous antidepressants and has had various side effects she also has had concern and worry that has prevented her from trying different medications I am hopeful psychiatry will have better success  She is using the Valium to help with her anxiety I encouraged her not to use it more than twice per day ideally  long-term we would want to reduce this  //Patient having spells of soreness in the throat as well as swollen glands we will treat this with amoxicillin

## 2018-08-29 ENCOUNTER — Ambulatory Visit: Payer: PPO | Admitting: Rheumatology

## 2018-08-29 ENCOUNTER — Other Ambulatory Visit: Payer: Self-pay

## 2018-08-29 ENCOUNTER — Encounter: Payer: Self-pay | Admitting: Rheumatology

## 2018-08-29 VITALS — BP 142/77 | HR 75 | Resp 15 | Ht 64.0 in | Wt 208.6 lb

## 2018-08-29 DIAGNOSIS — M19041 Primary osteoarthritis, right hand: Secondary | ICD-10-CM

## 2018-08-29 DIAGNOSIS — G252 Other specified forms of tremor: Secondary | ICD-10-CM

## 2018-08-29 DIAGNOSIS — Z96652 Presence of left artificial knee joint: Secondary | ICD-10-CM

## 2018-08-29 DIAGNOSIS — R5383 Other fatigue: Secondary | ICD-10-CM | POA: Diagnosis not present

## 2018-08-29 DIAGNOSIS — M797 Fibromyalgia: Secondary | ICD-10-CM

## 2018-08-29 DIAGNOSIS — M19071 Primary osteoarthritis, right ankle and foot: Secondary | ICD-10-CM

## 2018-08-29 DIAGNOSIS — F5101 Primary insomnia: Secondary | ICD-10-CM

## 2018-08-29 DIAGNOSIS — M8589 Other specified disorders of bone density and structure, multiple sites: Secondary | ICD-10-CM | POA: Diagnosis not present

## 2018-08-29 DIAGNOSIS — M19072 Primary osteoarthritis, left ankle and foot: Secondary | ICD-10-CM

## 2018-08-29 DIAGNOSIS — M1711 Unilateral primary osteoarthritis, right knee: Secondary | ICD-10-CM

## 2018-08-29 DIAGNOSIS — M19042 Primary osteoarthritis, left hand: Secondary | ICD-10-CM

## 2018-08-30 ENCOUNTER — Encounter (HOSPITAL_COMMUNITY): Payer: Self-pay

## 2018-08-30 NOTE — Therapy (Signed)
[place], [place], [address] Phone: [phone]   Fax:  [phone]  Patient Details  Name: Ruth [name] MRN: [id number] Date of Birth: Oct 28, 1940 Referring Provider:  No ref. provider found  Encounter Date: 08/30/2018   06/30/18 Pt called to say she hadn't received the message that I left earlier in the day. She said to go ahead and discharge her  PHYSICAL THERAPY DISCHARGE SUMMARY  Visits from Start of Care: 17  Current functional level related to goals / functional outcomes: See last note   Remaining deficits: See last note   Education / Equipment: n/a  Plan: Patient agrees to discharge.  Patient goals were partially met. Patient is being discharged due to being pleased with the current functional level.  ?????      Geraldine Solar PT, [place] [address] [place], [place], [address] Phone: [phone]   Fax:  [phone]

## 2018-09-01 DIAGNOSIS — M1711 Unilateral primary osteoarthritis, right knee: Secondary | ICD-10-CM | POA: Diagnosis not present

## 2018-09-01 DIAGNOSIS — M25561 Pain in right knee: Secondary | ICD-10-CM | POA: Diagnosis not present

## 2018-09-01 DIAGNOSIS — Z96652 Presence of left artificial knee joint: Secondary | ICD-10-CM | POA: Diagnosis not present

## 2018-09-04 ENCOUNTER — Encounter: Payer: Self-pay | Admitting: Family Medicine

## 2018-09-04 NOTE — Telephone Encounter (Signed)
Ruth [name], I did do a letter of appeal for this patient regarding her medication Please make sure that this letter does get forwarded to her insurance company The patient does not need to get a copy of this letter sent to her Thank you

## 2018-09-06 NOTE — Telephone Encounter (Signed)
Letter was mailed out and also went to her my chart account.

## 2018-09-09 DIAGNOSIS — M1711 Unilateral primary osteoarthritis, right knee: Secondary | ICD-10-CM | POA: Diagnosis not present

## 2018-09-16 DIAGNOSIS — M1711 Unilateral primary osteoarthritis, right knee: Secondary | ICD-10-CM | POA: Diagnosis not present

## 2018-09-19 ENCOUNTER — Other Ambulatory Visit: Payer: Self-pay

## 2018-09-19 ENCOUNTER — Ambulatory Visit (INDEPENDENT_AMBULATORY_CARE_PROVIDER_SITE_OTHER): Payer: PPO | Admitting: Neurology

## 2018-09-19 ENCOUNTER — Encounter: Payer: Self-pay | Admitting: Neurology

## 2018-09-19 ENCOUNTER — Encounter

## 2018-09-19 VITALS — BP 124/78 | HR 77 | Ht 65.0 in | Wt 208.0 lb

## 2018-09-19 DIAGNOSIS — R251 Tremor, unspecified: Secondary | ICD-10-CM

## 2018-09-19 NOTE — Patient Instructions (Signed)
You have a rather mild and intermittent tremor of both hands and in the legs. Your presentation (ie history and exam) are not in keeping with A hereditary tremor or what we call essential tremor.  Your tremor may very well be stress related.  I think it is a good idea to continue to work on stress management, also with a Social worker. You have an Appointment pending with a Social worker.  I do not see any signs or symptoms of parkinson's like disease or what we call parkinsonism.   For your tremor, I would not recommend any new medication for fear of side effects (especially sleepiness) or medication interactions. We do not have to make a follow up appointment.   Thankfully, your neurological exam is otherwise nonfocal, I do not see a pressing reason to do any further testing such as a brain scan.   Please remember, that any kind of tremor may be exacerbated by anxiety, anger, nervousness, excitement, dehydration, sleep deprivation, by caffeine, and low blood sugar values or blood sugar fluctuations. Some medications can exacerbate tremors, this includes SSRI type antidepressants. You have been on Prozac off and on for years and have done overall quite well with it.  Therefore, there is no pressing reason to reduce or stop the medication.

## 2018-09-19 NOTE — Progress Notes (Signed)
Subjective:    [name] is a 78 y.o. female.  HPI     Star Age, MD, PhD [facility] [address], [address] [place], [place] [address]  Dear Dr. Wolfgang Phoenix, I saw your patient, Ruth [name], upon your kind request in my neurologic clinic today for initial consultation of her tremors.  The patient is accompanied by her husband today.  As you know, Ruth [name] is a 78 year old right-handed woman with an underlying medical history of asthma, arthritis, anxiety, depression, fibromyalgia, history of eczema, reflux disease, hyperlipidemia, irritable bowel syndrome, neck pain, sleep apnea, history of vertigo and obesity, who reports intermittent tremors affecting her upper and lower extremities. I reviewed your telemedicine office note from 08/25/2018.  She reported residual depression and suboptimally controlled anxiety at the time.  She was advised to continue with her antidepressant and cautioned regarding the use of Valium. She reports that she first noticed the tremor after she had her left knee replacement surgery in December.  She recalls that she woke up terrified.  Her tremor may have improved a little bit over time.  It has not gone away completely.  She is not aware of any family history of tremors, she does not know her father's medical history very much, he died of lung cancer and she is not sure about her paternal side of family history.  She had a total of 3 sisters, 1 passed in childhood at age 8-1/2 and 1 sister passed away in her 25s from lung cancer, she has another sister in her 47s, she is the youngest of 16.  She has ongoing issues with some residual pain in the left knee.  She has finished physical therapy.  She has had a series of gel injections into the right knee which is painful.  She does not drink caffeine very much on a day-to-day basis, some tea but typically no day-to-day coffee or soda.  She tries to hydrate well with water.   She has not fallen recently.  She has not noticed any other neurological symptoms, tremor is intermittent and primarily in her knee areas in both hands.  She has been on Prozac off and on since the 80s.  She had stopped it briefly before her knee surgery and then restarted it.  She finds that out of all antidepressants tried over time the Prozac has helped her the most.  She endorses stress, she reports that their son is autistic.  She is scheduled to see a counselor as I understand.  She has not pending she indicates.   Her Past Medical History Is Significant For: Past Medical History:  Diagnosis Date  . Anxiety   . Arthritis   . Asthma   . Depression   . Eczema   . Fibromyalgia   . GERD (gastroesophageal reflux disease)    EGD/ colon 1/09  . H pylori ulcer 1980-1990   s/p treatment  . Headache   . Hyperlipidemia   . IBS (irritable bowel syndrome)   . Laryngospasm   . Neck pain   . OSA (obstructive sleep apnea)   . PONV (postoperative nausea and vomiting)   . Refusal of blood transfusions as patient is Jehovah's Witness   . Vertigo     Her Past Surgical History Is Significant For: Past Surgical History:  Procedure Laterality Date  . ABDOMINAL HYSTERECTOMY  1970  . APPENDECTOMY    . BILATERAL SALPINGOOPHORECTOMY  1990s  . CARDIAC CATHETERIZATION  2002   (  [facility]) normal coronary arteries   . COLONOSCOPY  01/2007   Dr. Meriel Flavors  . COLONOSCOPY N/A 04/18/2015   Procedure: COLONOSCOPY;  Surgeon: Rogene Houston, MD;  [facility]: [facility];  Service: Endoscopy;  Laterality: N/A;  830 - moved to 8:55 - Ann to notify pt  . COLONOSCOPY WITH ESOPHAGOGASTRODUODENOSCOPY (EGD)  02/16/2012   Procedure: COLONOSCOPY WITH ESOPHAGOGASTRODUODENOSCOPY (EGD);  Surgeon: Daneil Dolin, MD;  Location: [facility];  Service: Endoscopy;  Laterality: N/A;  8:45  . ESOPHAGEAL DILATION N/A 06/07/2014   Procedure: ESOPHAGEAL DILATION;  Surgeon: Rogene Houston, MD;  [facility]: [facility];   Service: Endoscopy;  Laterality: N/A;  . ESOPHAGOGASTRODUODENOSCOPY  01/2007   Dr Sharlett Iles- 3 cm hiatal hernia, benign esophageal biopsies, erosive esophagitis, gastritis  . ESOPHAGOGASTRODUODENOSCOPY N/A 06/07/2014   Procedure: ESOPHAGOGASTRODUODENOSCOPY (EGD);  Surgeon: Rogene Houston, MD;  [facility];  Service: Endoscopy;  Laterality: N/A;  1200  . ESOPHAGOGASTRODUODENOSCOPY N/A 08/18/2017   Procedure: ESOPHAGOGASTRODUODENOSCOPY (EGD);  Surgeon: Rogene Houston, MD;  [facility];  Service: Endoscopy;  Laterality: N/A;  2:20  . FOOT SURGERY    . INNER EAR SURGERY     Left  . KNEE SURGERY     Multiple  . SHOULDER SURGERY  2011  . TONSILLECTOMY    . TOTAL KNEE ARTHROPLASTY Left 02/07/2018   Procedure: TOTAL KNEE ARTHROPLASTY;  Surgeon: Gaynelle Arabian, MD;  Location: [facility];  Service: Orthopedics;  Laterality: Left;  66min  . TUBAL LIGATION    . TYMPANOSTOMY TUBE PLACEMENT      Her Family History Is Significant For: Family History  Problem Relation Age of Onset  . Diabetes Father   . Heart attack Father        27's  . Lung cancer Father   . Cirrhosis Father 80       etoh cirrhosis  . Eczema Father   . Lung cancer Sister   . Depression Sister   . Alzheimer's disease Sister   . Hypertension Sister   . Diabetes Sister   . Lung cancer Sister   . Paranoid behavior Mother        depression  . Colon polyps Mother   . Eczema Mother   . COPD Daughter   . Allergic rhinitis Neg Hx   . Asthma Neg Hx   . Urticaria Neg Hx     Her Social History Is Significant For: Social History   Socioeconomic History  . Marital status: Married    Spouse name: Not on file  . Number of children: 2  . Years of education: Not on file  . Highest education level: Not on file  Occupational History  . Occupation: Retired; Cytogeneticist  . Financial resource strain: Not on file  . Food insecurity    Worry: Not on file    Inability: Not on file  .  Transportation needs    Medical: Not on file    Non-medical: Not on file  Tobacco Use  . Smoking status: Never Smoker  . Smokeless tobacco: Never Used  Substance and Sexual Activity  . Alcohol use: Not Currently    Alcohol/week: 0.0 standard drinks  . Drug use: No  . Sexual activity: Yes    Partners: Male    Comment: Hysterectomy  Lifestyle  . Physical activity    Days per week: Not on file    Minutes per session: Not on file  . Stress: Not on file  Relationships  . Social Herbalist on phone: Not on file    Gets together: Not on file    Attends religious service: Not on file    Active member of club or organization: Not on file    Attends meetings of clubs or organizations: Not on file    Relationship status: Not on file  Other Topics Concern  . Not on file  Social History Narrative   Married   2 grown children, 1 adopted son-autistic-lives next door    Her Allergies Are:  Allergies  Allergen Reactions  . Blood-Group Specific Substance Other (See Comments)    NO BLOOD PRODUCTS-refuses transfusions  . Ciprofloxacin Other (See Comments)    Pt reports extreme fatigue  . Codeine Nausea And Vomiting  . Latex Other (See Comments)    Red and raw area around incision  . Vicodin [Hydrocodone-Acetaminophen] Nausea And Vomiting  . Tramadol Other (See Comments)    Increased heart rate,increased blood pressure, edema  :   Her Current Medications Are:  Outpatient Encounter Medications as of 09/19/2018  Medication Sig  . albuterol (PROVENTIL HFA;VENTOLIN HFA) 108 (90 Base) MCG/ACT inhaler Inhale 2 puffs into the lungs every 6 (six) hours as needed for wheezing.  . diazepam (VALIUM) 5 MG tablet Take one tablet by mouth 3 times daily as needed. CAUTION DROWSINESS  . dicyclomine (BENTYL) 10 MG capsule Take 1 capsule (10 mg total) by mouth 3 (three) times daily as needed. For stomach cramps  . FLUoxetine (PROZAC) 20 MG tablet Take one tablet by mouth BID  . fluticasone  (FLONASE) 50 MCG/ACT nasal spray Place 2 sprays into both nostrils daily as needed for allergies.   . hydrOXYzine (ATARAX/VISTARIL) 25 MG tablet Take 1 tablet (25 mg total) by mouth every 6 (six) hours as needed for anxiety.  Marland Kitchen ketoconazole (NIZORAL) 2 % cream Apply 1 application topically daily.  . mometasone (ELOCON) 0.1 % cream Apply BID prn  . nitroGLYCERIN (NITROSTAT) 0.4 MG SL tablet Place 1 tablet (0.4 mg total) under the tongue every 5 (five) minutes as needed for chest pain.  Marland Kitchen Olopatadine HCl 0.2 % SOLN 1 drop each eye each evening as needed  . ondansetron (ZOFRAN) 4 MG tablet Take 1 tablet (4 mg total) by mouth every 6 (six) hours as needed for nausea.  Marland Kitchen OVER THE COUNTER MEDICATION b complex one daily d3 one daily Probiotics one daily magnessium  . Polyvinyl Alcohol (LUBRICANT DROPS OP) Place 2 drops into both eyes 3 (three) times daily as needed (for dry eyes).  . [DISCONTINUED] amoxicillin (AMOXIL) 500 MG capsule Take one capsule by mouth TID for 7 days  . [DISCONTINUED] FLUoxetine (PROZAC) 20 MG tablet 1 twice daily (Patient not taking: Reported on 08/29/2018)  . [DISCONTINUED] guaifenesin (HUMIBID E) 400 MG TABS tablet Take 800 mg by mouth 3 (three) times daily.  . [DISCONTINUED] pantoprazole (PROTONIX) 40 MG tablet Take 40 mg by mouth 2 (two) times daily.   No facility-administered encounter medications on file as of 09/19/2018.   :   Review of Systems:  Out of a complete 14 point review of systems, all are reviewed and negative with the exception of these symptoms as listed below:    Review of Systems  Neurological:       Pt presents today to discuss her tremor. Pt has noticed the tremor since her knee surgery in December. She notices the tremor in her hands and knees. It is stable but intermittent. Pt is  right handed.    Objective:  Neurological Exam  Physical Exam Physical Examination:   Vitals:   09/19/18 1011  BP: 124/78  Pulse: 77   General Examination:  The patient is a very pleasant 78 y.o. female in no acute distress. She appears well-developed and well-nourished and well groomed.   HEENT: Normocephalic, atraumatic, pupils are equal, round and reactive to light and accommodation. She wears corrective eyeglasses.  Hearing is grossly intact, extraocular tracking is fairly well-preserved, she has normal facial symmetry, normal speech, no voice or neck tremor.  Airway examination reveals moderately dry mouth and otherwise nonfocal findings, tongue protrudes centrally in palate elevates symmetrically. There are no carotid bruits on auscultation.    Chest: Clear to auscultation without wheezing, rhonchi or crackles noted.  Heart: S1+S2+0, regular and normal without murmurs, rubs or gallops noted.   Abdomen: Soft, non-tender and non-distended with normal bowel sounds appreciated on auscultation.  Extremities: There is no pitting edema in the distal lower extremities bilaterally. Pedal pulses are intact.  Skin: Warm and dry without trophic changes noted.  Musculoskeletal: exam reveals no obvious joint deformities, tenderness or joint swelling or erythema With the exception of mild tenderness in her left knee and also right knee pain with reduced range of motion in both knees especially with left knee flexion and right knee movements.   Neurologically:  Mental status: The patient is awake, alert and oriented in all 4 spheres. Her immediate and remote memory, attention, language skills and fund of knowledge are appropriate. There is no evidence of aphasia, agnosia, apraxia or anomia. Speech is clear with normal prosody and enunciation. Thought process is linear. Mood is normal and affect is blunted.  Cranial nerves II - XII are as described above under HEENT exam. In addition: shoulder shrug is normal with equal shoulder height noted. Motor exam: Normal bulk, strength and tone is noted. There is no drift, resting tremor or rebound.  On 09/19/2018: On  Archimedes spiral drawing she has slight insecurity with her left hand, less so with the right hand, no obvious trembling with the right hand, handwriting is legible, not particularly tremulous or micrographic.  She has a mild and intermittent tremor in both upper extremities with posture, not with action or intention.  She has a slightly variable amplitude of her tremor and a variable frequency.  In addition, she has an intermittent tremor in both proximal Lower extremities as in side to side shaking of her legs which is intermittent and somewhat distractible. Reflexes are 1+ throughout. (I braced knee refl exam with my fingers). Fine motor skills and coordination: intact with normal finger taps, normal hand movements, normal rapid alternating patting, normal foot taps and normal foot agility.  Cerebellar testing: No dysmetria or intention tremor on finger to nose testing. Heel to shin is Difficult due to decreased range of motion in her knees bilaterally. Sensory exam: intact to light touch in the upper and lower extremities.  Gait, station and balance: She stands With slight difficulty and pushes herself up.  She brought a rolling walker with seat but can walk short distance without her walker.  She reports that she uses a cane at the house.  She has a limp on the right, preserved arm swing, no tremor with standing or walking.   Assessment and Plan:   In summary, LEETTA HENDRIKS is a very pleasant 78 y.o.-year old female with an underlying medical history of asthma, arthritis, anxiety, depression, fibromyalgia, history of eczema, reflux disease, hyperlipidemia,  irritable bowel syndrome, neck pain, sleep apnea, history of vertigo and obesity, who Presents for evaluation of her intermittent tremors in both hands and also in the proximal lower extremities.  She reports that the tremor was fairly abrupt in onset after her knee replacement surgery.  She is reassured largely today.Thankfully, she has noticed a  slight improvement over time.  She has noticed a correlation with stress.  She has no signs of parkinsonism and is reassured in that regard as well.  Her history and examination/presentation is not classic for essential tremor either.  She is advised that sometimes medications can exacerbate or bring on tremors such as SSRI type antidepressants but she has been on Prozac for years off and on.  This certainly not a reason to reduce or discontinue her antidepressant medication as it has helped her.  She is advised that Valium or related benzodiazepine type medications are not recommended for tremor treatment but can certainly be used as needed for anxiety and stress reduction.  She is working on Child psychotherapist and has an appointment with a counselor as I understand.  From my end of things, she is advised that there is no pressing reason for any other test, Such as a brain scan.Of note, she had thyroid function tested a little over 2 months ago which was fine. We talked about tremor triggers.  She is advised to be cautious with caffeine intake.  She does not use caffeine on a day-to-day basis, if anything in the form of tea and limitation.  She is advised to stay well-hydrated with water.  From my end of things, she can follow-up on an as-needed basis.  I answered all their questions today and the patient and her husband were in agreement.  Thank you very much for allowing me to participate in the care of this nice patient. If I can be of any further assistance to you please do not hesitate to call me at [phone].  Sincerely,   Star Age, MD, PhD
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
## 2018-09-27 ENCOUNTER — Other Ambulatory Visit: Payer: Self-pay

## 2018-09-27 ENCOUNTER — Encounter (HOSPITAL_COMMUNITY): Payer: Self-pay | Admitting: Psychiatry

## 2018-09-27 ENCOUNTER — Ambulatory Visit (INDEPENDENT_AMBULATORY_CARE_PROVIDER_SITE_OTHER): Payer: PPO | Admitting: Psychiatry

## 2018-09-27 DIAGNOSIS — F331 Major depressive disorder, recurrent, moderate: Secondary | ICD-10-CM | POA: Diagnosis not present

## 2018-09-27 NOTE — Patient Instructions (Addendum)
1. Continue fluoxetine 20 mg daily  2. Continue diazepam 5 mg twice a day as needed for anxiety 3. Next appointment: 9/8 at 11:30  4. Referral to therapy

## 2018-10-04 ENCOUNTER — Other Ambulatory Visit: Payer: Self-pay

## 2018-10-04 ENCOUNTER — Ambulatory Visit: Payer: PPO | Admitting: Neurology

## 2018-10-04 ENCOUNTER — Encounter (HOSPITAL_COMMUNITY): Payer: Self-pay | Admitting: Psychiatry

## 2018-10-04 ENCOUNTER — Ambulatory Visit (INDEPENDENT_AMBULATORY_CARE_PROVIDER_SITE_OTHER): Payer: PPO | Admitting: Psychiatry

## 2018-10-04 DIAGNOSIS — F331 Major depressive disorder, recurrent, moderate: Secondary | ICD-10-CM

## 2018-10-05 DIAGNOSIS — H6122 Impacted cerumen, left ear: Secondary | ICD-10-CM | POA: Diagnosis not present

## 2018-10-05 DIAGNOSIS — H61012 Acute perichondritis of left external ear: Secondary | ICD-10-CM | POA: Diagnosis not present
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
## 2018-10-12 DIAGNOSIS — H61012 Acute perichondritis of left external ear: Secondary | ICD-10-CM | POA: Diagnosis not present

## 2018-10-12 NOTE — Progress Notes (Signed)
Virtual Visit via Video Note  I connected with Ruth [name] on 10/25/18 at 11:30 AM EDT by a video enabled telemedicine application and verified that I am speaking with the correct person using two identifiers.   I discussed the limitations of evaluation and management by telemedicine and the availability of in person appointments. The patient expressed understanding and agreed to proceed.     I discussed the assessment and treatment plan with the patient. The patient was provided an opportunity to ask questions and all were answered. The patient agreed with the plan and demonstrated an understanding of the instructions.   The patient was advised to call back or seek an in-person evaluation if the symptoms worsen or if the condition fails to improve as anticipated.  I provided 25 minutes of non-face-to-face time during this encounter.   Norman Clay, MD    [facility] MD/PA/NP OP Progress Note  10/25/2018 12:09 PM Ruth [name]  MRN:  [id number]  Chief Complaint:  Chief Complaint    Depression; Follow-up     HPI:  This is a follow-up appointment for depression.  She states that she has been feeling "strange." She discontinued fluoxetine a few weeks ago after she forgot to take one dose. She noticed that her hand tremors has been improving since then. However, she also notices that she used to have more motivation and energy when she used to on fluoxetine. She becomes tearful, stating that she often feels worse after talking with a psychiatrist (she attributed this to her pattern of seeing a psychiatrist on and off). She currently feels depressed, hopeless and despair while she denies any SI. She reports "rocky" situation with her son with autism. She also reports concern about her daughter, whose husband had heart attack. There was also recent some issues with foundation and inherited home with termite. She feels guilty that "I am not there 100% for them (her family.)" She has not had time  for herself. She has not done bible study to work on these family issues.  She has middle insomnia.  She feels fatigue.  She has low motivation and energy, although she was able to drive and meet with her daughter, whom she had not met since surgery. She has fair concentration. She feels anxious, tense and has intense anxiety at times.    Visit Diagnosis:    ICD-10-CM   1. MDD (major depressive disorder), recurrent episode, moderate ([place])  F33.1     Past Psychiatric History: Please see initial evaluation for full details. I have reviewed the history. No updates at this time.     Past Medical History:  Past Medical History:  Diagnosis Date  . Anxiety   . Arthritis   . Asthma   . Depression   . Eczema   . Fibromyalgia   . GERD (gastroesophageal reflux disease)    EGD/ colon 1/09  . H pylori ulcer 1980-1990   s/p treatment  . Headache   . Hyperlipidemia   . IBS (irritable bowel syndrome)   . Laryngospasm   . Neck pain   . OSA (obstructive sleep apnea)   . PONV (postoperative nausea and vomiting)   . Refusal of blood transfusions as patient is Jehovah's Witness   . Vertigo     Past Surgical History:  Procedure Laterality Date  . ABDOMINAL HYSTERECTOMY  1970  . APPENDECTOMY    . BILATERAL SALPINGOOPHORECTOMY  1990s  . CARDIAC CATHETERIZATION  2002   [facility]) normal coronary arteries   .  COLONOSCOPY  01/2007   Dr. Meriel Flavors  . COLONOSCOPY N/A 04/18/2015   Procedure: COLONOSCOPY;  Surgeon: Rogene Houston, MD;  [facility];  Service: Endoscopy;  Laterality: N/A;  830 - moved to 8:55 - Ann to notify pt  . COLONOSCOPY WITH ESOPHAGOGASTRODUODENOSCOPY (EGD)  02/16/2012   Procedure: COLONOSCOPY WITH ESOPHAGOGASTRODUODENOSCOPY (EGD);  Surgeon: Daneil Dolin, MD;  Location: [facility];  Service: Endoscopy;  Laterality: N/A;  8:45  . ESOPHAGEAL DILATION N/A 06/07/2014   Procedure: ESOPHAGEAL DILATION;  Surgeon: Rogene Houston, MD;  Location: [facility];   Service: Endoscopy;  Laterality: N/A;  . ESOPHAGOGASTRODUODENOSCOPY  01/2007   Dr Sharlett Iles- 3 cm hiatal hernia, benign esophageal biopsies, erosive esophagitis, gastritis  . ESOPHAGOGASTRODUODENOSCOPY N/A 06/07/2014   Procedure: ESOPHAGOGASTRODUODENOSCOPY (EGD);  Surgeon: Rogene Houston, MD;  [facility];  Service: Endoscopy;  Laterality: N/A;  1200  . ESOPHAGOGASTRODUODENOSCOPY N/A 08/18/2017   Procedure: ESOPHAGOGASTRODUODENOSCOPY (EGD);  Surgeon: Rogene Houston, MD;  [facility];  Service: Endoscopy;  Laterality: N/A;  2:20  . FOOT SURGERY    . INNER EAR SURGERY     Left  . KNEE SURGERY     Multiple  . SHOULDER SURGERY  2011  . TONSILLECTOMY    . TOTAL KNEE ARTHROPLASTY Left 02/07/2018   Procedure: TOTAL KNEE ARTHROPLASTY;  Surgeon: Gaynelle Arabian, MD;  Location: [facility];  Service: Orthopedics;  Laterality: Left;  4mn  . TUBAL LIGATION    . TYMPANOSTOMY TUBE PLACEMENT      Family Psychiatric History: Please see initial evaluation for full details. I have reviewed the history. No updates at this time.     Family History:  Family History  Problem Relation Age of Onset  . Diabetes Father   . Heart attack Father        548's . Lung cancer Father   . Cirrhosis Father 522      etoh cirrhosis  . Eczema Father   . Lung cancer Sister   . Depression Sister   . Alzheimer's disease Sister   . Hypertension Sister   . Diabetes Sister   . Lung cancer Sister   . Paranoid behavior Mother        depression  . Colon polyps Mother   . Eczema Mother   . Depression Mother   . COPD Daughter   . Depression Maternal Aunt   . Drug abuse Niece   . Suicidality Niece   . Allergic rhinitis Neg Hx   . Asthma Neg Hx   . Urticaria Neg Hx     Social History:  Social History   Socioeconomic History  . Marital status: Married    Spouse name: Not on file  . Number of children: 2  . Years of education: Not on file  . Highest education level: Not on file   Occupational History  . Occupation: Retired; OCytogeneticist . Financial resource strain: Not on file  . Food insecurity    Worry: Not on file    Inability: Not on file  . Transportation needs    Medical: Not on file    Non-medical: Not on file  Tobacco Use  . Smoking status: Never Smoker  . Smokeless tobacco: Never Used  Substance and Sexual Activity  . Alcohol use: Not Currently    Alcohol/week: 0.0 standard drinks  . Drug use: No  . Sexual activity: Not Currently    Partners: Male  Comment: Hysterectomy  Lifestyle  . Physical activity    Days per week: Not on file    Minutes per session: Not on file  . Stress: Not on file  Relationships  . Social Herbalist on phone: Not on file    Gets together: Not on file    Attends religious service: Not on file    Active member of club or organization: Not on file    Attends meetings of clubs or organizations: Not on file    Relationship status: Not on file  Other Topics Concern  . Not on file  Social History Narrative   Married   2 grown children, 1 adopted son-autistic-lives next door    Allergies:  Allergies  Allergen Reactions  . Blood-Group Specific Substance Other (See Comments)    NO BLOOD PRODUCTS-refuses transfusions  . Ciprofloxacin Other (See Comments)    Pt reports extreme fatigue  . Codeine Nausea And Vomiting  . Latex Other (See Comments)    Red and raw area around incision  . Vicodin [Hydrocodone-Acetaminophen] Nausea And Vomiting  . Tramadol Other (See Comments)    Increased heart rate,increased blood pressure, edema    Metabolic Disorder Labs: Lab Results  Component Value Date   HGBA1C 5.9 (H) 08/25/2016   MPG 123 (H) 03/17/2014   MPG 117 (H) 03/02/2013   No results found for: PROLACTIN Lab Results  Component Value Date   CHOL 253 (H) 09/23/2017   TRIG 103 09/23/2017   HDL 49 09/23/2017   CHOLHDL 5.2 (H) 09/23/2017   VLDL 30 03/17/2014   LDLCALC 183 (H)  09/23/2017   LDLCALC 202 (H) 08/25/2016   Lab Results  Component Value Date   TSH 1.950 07/07/2018   TSH 3.263 07/14/2015    Therapeutic Level Labs: No results found for: LITHIUM No results found for: VALPROATE No components found for:  CBMZ  Current Medications: Current Outpatient Medications  Medication Sig Dispense Refill  . albuterol (PROVENTIL HFA;VENTOLIN HFA) 108 (90 Base) MCG/ACT inhaler Inhale 2 puffs into the lungs every 6 (six) hours as needed for wheezing. 1 Inhaler 2  . diazepam (VALIUM) 5 MG tablet Take one tablet by mouth 3 times daily as needed. CAUTION DROWSINESS 90 tablet 2  . dicyclomine (BENTYL) 10 MG capsule Take 1 capsule (10 mg total) by mouth 3 (three) times daily as needed. For stomach cramps 90 capsule 5  . FLUoxetine (PROZAC) 10 MG tablet Take 1 tablet (10 mg total) by mouth daily. 30 tablet 1  . FLUoxetine (PROZAC) 20 MG tablet Take one tablet by mouth BID 60 tablet 2  . fluticasone (FLONASE) 50 MCG/ACT nasal spray Place 2 sprays into both nostrils daily as needed for allergies.     . hydrOXYzine (ATARAX/VISTARIL) 25 MG tablet Take 1 tablet (25 mg total) by mouth every 6 (six) hours as needed for anxiety. (Patient not taking: Reported on 10/04/2018) 60 tablet 2  . ketoconazole (NIZORAL) 2 % cream Apply 1 application topically daily. (Patient not taking: Reported on 10/04/2018) 60 g 5  . mometasone (ELOCON) 0.1 % cream Apply BID prn (Patient not taking: Reported on 10/04/2018) 15 g 0  . nitroGLYCERIN (NITROSTAT) 0.4 MG SL tablet Place 1 tablet (0.4 mg total) under the tongue every 5 (five) minutes as needed for chest pain. 20 tablet 0  . Olopatadine HCl 0.2 % SOLN 1 drop each eye each evening as needed (Patient not taking: Reported on 10/04/2018) 1 Bottle 3  . ondansetron (ZOFRAN) 4  MG tablet Take 1 tablet (4 mg total) by mouth every 6 (six) hours as needed for nausea. (Patient not taking: Reported on 09/27/2018) 20 tablet 0  . OVER THE COUNTER MEDICATION b complex  one daily d3 one daily Probiotics one daily magnessium    . Polyvinyl Alcohol (LUBRICANT DROPS OP) Place 2 drops into both eyes 3 (three) times daily as needed (for dry eyes).     No current facility-administered medications for this visit.      Musculoskeletal: Strength & Muscle Tone: N/A Gait & Station: N/A Patient leans: N/A  Psychiatric Specialty Exam: Review of Systems  Psychiatric/Behavioral: Positive for depression. Negative for hallucinations, memory loss, substance abuse and suicidal ideas. The patient is nervous/anxious and has insomnia.   All other systems reviewed and are negative.   There were no vitals taken for this visit.There is no height or weight on file to calculate BMI.  General Appearance: Fairly Groomed  Eye Contact:  Good  Speech:  Clear and Coherent  Volume:  Normal  Mood:  Anxious  Affect:  Appropriate, Restricted and Tearful  Thought Process:  Coherent  Orientation:  Full (Time, Place, and Person)  Thought Content: Logical   Suicidal Thoughts:  No  Homicidal Thoughts:  No  Memory:  Immediate;   Good  Judgement:  Good  Insight:  Fair  Psychomotor Activity:  Normal  Concentration:  Concentration: Good and Attention Span: Good  Recall:  Good  Fund of Knowledge: Good  Language: Good  Akathisia:  No  Handed:  Right  AIMS (if indicated): not done  Assets:  Communication Skills Desire for Improvement  ADL's:  Intact  Cognition: WNL  Sleep:  Fair   Screenings: GAD-7     Office Visit from 04/07/2018 in [place] Office Visit from 03/07/2018 in [place]  Total GAD-7 Score  9  16    PHQ2-9     Office Visit from 04/07/2018 in [place] Visit from 03/07/2018 in [place] Visit from 04/22/2017 in [place] Visit from 01/20/2017 in [place]  PHQ-2 Total Score  '4  3  6  2  ' PHQ-9 Total Score  '14  16  20  18       ' Assessment and  Plan:  Ruth [name] is a 78 y.o. year old female with a history of depression, fibromyalgia, neck pain, arthritis, sleep apnea, irritable bowel syndrome, hyperlipidemia, GERD, asthma, coarse tremors (evaluated by neurology, which is not consistent with essential tremor), s/p left TKA on Dec 2019 , who presents for follow up appointment for MDD (major depressive disorder), recurrent episode, moderate ([place])  # MDD, moderate, recurrent without psychotic features She continues to demonstrate restricted affect, and reports depressive symptoms with anxiety since TKA on December 2019.  She self discontinued Prozac with concern of hand tremors.  After having discussion at length regarding treatment options, she is willing to try lower dose of fluoxetine given it did help for depressive symptoms.  Noted that although she denies any significant psychosocial stressors, she reports concern about her adopted son with autism spectrum.  She is on diazepam, prescribed by PCP; discussed risk of dependence and oversedation.  She is encouraged to have follow-up with Mr. Malvin Johns for therapy. Of note, although the chart review indicates she did have suicide attempt from carbon monoxide in 1980's, she denies this event on today's evaluation. Will continue to assess as needed.   # r/o sleep apnea Patient  reportedly snores at night, and has daytime fatigue. Last sleep study in 2016 according to the chart. She will contact her provider for further evaluation.    Plan 1. Reinitiate lower dose of fluoxetine: 10 mg dialy 2. Continue diazepam 5 mg twice a day as needed for anxiety 3. Next appointment: 10/20 at 11:30 for 30 mins, video - She sees Ms. Bynum for therapy  Past trials of medication: sertraline (worsening depression), fluoxetine (palpitation at 40 mg daily), lexapro (drowsiness),  Paxil ([place]ombie), bupropion (dull), venlafaxine (palpitation), duloxetine, Serzone, amitriptyline (weight gain), Trazodone  The patient  demonstrates the following risk factors for suicide: Chronic risk factors for suicide include: psychiatric disorder of depression and previous suicide attempts of carbone monoxide. Acute risk factors for suicide include: N/A. Protective factors for this patient include: positive social support, responsibility to others (children, family) and hope for the future. Considering these factors, the overall suicide risk at this point appears to be low. Patient is appropriate for outpatient follow up.  The duration of this appointment visit was 25 minutes of non face-to-face time with the patient.  Greater than 50% of this time was spent in counseling, explanation of  diagnosis, planning of further management, and coordination of care.  Norman Clay, MD 10/25/2018, 12:09 PM

## 2018-10-25 ENCOUNTER — Encounter (HOSPITAL_COMMUNITY): Payer: Self-pay | Admitting: Psychiatry

## 2018-10-25 ENCOUNTER — Other Ambulatory Visit: Payer: Self-pay

## 2018-10-25 ENCOUNTER — Ambulatory Visit (INDEPENDENT_AMBULATORY_CARE_PROVIDER_SITE_OTHER): Payer: PPO | Admitting: Psychiatry

## 2018-10-25 DIAGNOSIS — F331 Major depressive disorder, recurrent, moderate: Secondary | ICD-10-CM | POA: Diagnosis not present

## 2018-10-25 MED ORDER — FLUOXETINE HCL 10 MG PO TABS: 10.0000 mg | ORAL_TABLET | Freq: Every day | ORAL | 1 refills | Status: DC

## 2018-10-25 NOTE — Patient Instructions (Signed)
1. Reinitiate lower dose of fluoxetine: 10 mg daily 2. Continue diazepam 5 mg twice a day as needed for anxiety 3. Next appointment: 10/20 at 11:30

## 2018-10-26 ENCOUNTER — Other Ambulatory Visit: Payer: Self-pay

## 2018-10-26 ENCOUNTER — Ambulatory Visit (HOSPITAL_COMMUNITY): Payer: PPO | Admitting: Psychiatry

## 2018-10-26 ENCOUNTER — Telehealth (HOSPITAL_COMMUNITY): Payer: Self-pay | Admitting: Psychiatry

## 2018-10-26 NOTE — Telephone Encounter (Signed)
Therapist attempted to contact patient via twice for scheduled appointment, no response. Therapist called patient and left message indicating attempt and requesting patient call office.

## 2018-11-10 ENCOUNTER — Ambulatory Visit (INDEPENDENT_AMBULATORY_CARE_PROVIDER_SITE_OTHER): Payer: PPO | Admitting: Psychiatry

## 2018-11-10 ENCOUNTER — Other Ambulatory Visit: Payer: Self-pay

## 2018-11-10 DIAGNOSIS — F331 Major depressive disorder, recurrent, moderate: Secondary | ICD-10-CM

## 2018-11-10 NOTE — Progress Notes (Signed)
Virtual Visit via Video Note  I connected with Ruth [name] on 11/10/18 at  4:00 PM EDT by a video enabled telemedicine application and verified that I am speaking with the correct person using two identifiers.   I discussed the limitations of evaluation and management by telemedicine and the availability of in person appointments. The patient expressed understanding and agreed to proceed.  I provided 45 minutes of non-face-to-face time during this encounter.   Alonza Smoker, LCSW    THERAPIST PROGRESS NOTE  Session Time:  Thursday 11/10/2018 4:15 PM - 5:00 PM   Participation Level: Active  Behavioral Response: CasualAlertAnxious and Depressed  Type of Therapy: Individual Therapy  Treatment Goals addressed: Establish rapport, learn and implement cognitive and behavioral strategies to overcome depression and cope with anxiety  Interventions: CBT and Supportive  Summary: Ruth [name] is a 78 y.o. female who is referred for services by psychiatrist Dr. Modesta Messing due to patient experiencing symptoms of anxiety and depression.  She reports one psychiatric hospitalization due to depression in the late 80s.  She also was hospitalized at [facility] in the 91s for depression.  She reports participating in therapy intermittently since the 70s.  She reports last being seen for therapy in this practice several years ago.  Patient reports current symptoms began after her knee replacement surgery in December 2019.  She reports experiencing terror, tremors, and panic attacks since that time. When these episodes occur, she reports freezing and being fearful of being alone.  She also reports experiencing social withdrawal, loss of interest and pleasure in activities, hopelessness, irritability, tearfulness, worthlessness, muscle tension, sleep difficulty, and excessive worry.  Stressors include concerns about her adopted 75 year old son who actually is the patient's biological grandson and has  autism.  Patient last was seen via virtual visit about 3 to 4 weeks ago.  She reports decreased shaking and tremors but increased anxiety and worry.  She suspects her son is using crack cocaine.  She reports confronting him but says he denies.  She reports ruminating thoughts of what if's about his future along with potential danger for himself and others.  She reports sleep difficulty and feelings of hopelessness as well as helplessness.  She also expresses guilt.  Suicidal/Homicidal: Nowithout intent/plan  Therapist Response: Established rapport, reviewed symptoms, discussed stressors, facilitated expression of thoughts and feelings, validated feelings, began to examine patient's thought patterns, explored possible support for patient including Al-Anon, discussed anxiety and stress response, provided psychoeducation on ways to trigger relaxation response, discussed rationale for and assisted patient practice deep breathing to trigger relaxation response, assigned patient to practice 5 to 10 minutes twice per day, assigned patient to review handout on deep breathing  Plan: Return again in 2 weeks.  Diagnosis: Axis I: MDD, Recurrent, Moderate    Axis II: No diagnosis    Alonza Smoker, LCSW 11/10/2018

## 2018-11-11 ENCOUNTER — Other Ambulatory Visit: Payer: Self-pay

## 2018-11-11 ENCOUNTER — Ambulatory Visit (INDEPENDENT_AMBULATORY_CARE_PROVIDER_SITE_OTHER): Payer: PPO | Admitting: Family Medicine

## 2018-11-11 DIAGNOSIS — R51 Headache: Secondary | ICD-10-CM

## 2018-11-11 DIAGNOSIS — R519 Headache, unspecified: Secondary | ICD-10-CM

## 2018-11-11 NOTE — Progress Notes (Signed)
   Subjective:    [name], female    DOB: 1940-02-17, 78 y.o.   MRN: [id number]  HPI  Patient calls to discuss headaches and tender glands for 6 weeks. Patient saw ENT and her told then he thought it was inflammation of cartilage but still having issues. She relates a lot of soreness on one side of her face with some swelling but then it went away she went to the ENT they put her on an antibiotic she still has a little soreness in her glands she wonders if it is due to her sinuses or possibly due to other measures she denies any difficulty swallowing denies any numbness or radiation.  PMH benign Virtual Visit via Video Note  I connected with Ruth [name] on 11/11/18 at  1:10 PM EDT by a video enabled telemedicine application and verified that I am speaking with the correct person using two identifiers.  Location: Patient: home  Provider: office   I discussed the limitations of evaluation and management by telemedicine and the availability of in person appointments. The patient expressed understanding and agreed to proceed.  History of Present Illness:    Observations/Objective:   Assessment and Plan:   Follow Up Instructions:    I discussed the assessment and treatment plan with the patient. The patient was provided an opportunity to ask questions and all were answered. The patient agreed with the plan and demonstrated an understanding of the instructions.   The patient was advised to call back or seek an in-person evaluation if the symptoms worsen or if the condition fails to improve as anticipated.  I provided 15 minutes of non-face-to-face time during this encounter.  She does relate temporal pain on the left side that is intermittent sharp aching in the temporal region but no double vision or blurred vision or loss of vision    Review of Systems  Constitutional: Negative for activity change, appetite change and fatigue.  HENT: Negative for  congestion.   Respiratory: Negative for cough.   Cardiovascular: Negative for chest pain.  Gastrointestinal: Negative for abdominal pain.  Skin: Negative for color change.  Neurological: Negative for headaches.  Psychiatric/Behavioral: Negative for behavioral problems.   It is positive for the soreness around the neck more on the left side but she does not feel any nodules    Objective:   Physical Exam   Patient had virtual visit Appears to be in no distress Atraumatic Neuro able to relate and oriented No apparent resp distress Color normal      Assessment & Plan:  The soreness in her neck I think is probably more orthopedic but if she has ongoing trouble I told her we ought to do up the entire person visit  Because of the tenderness in the temporal region we will do a sed rate to rule out the possibility of temporal arteritis  Flu shot recommended patient will set this up

## 2018-11-14 ENCOUNTER — Telehealth: Payer: Self-pay | Admitting: Family Medicine

## 2018-11-14 DIAGNOSIS — R7303 Prediabetes: Secondary | ICD-10-CM

## 2018-11-14 DIAGNOSIS — E782 Mixed hyperlipidemia: Secondary | ICD-10-CM

## 2018-11-14 NOTE — Telephone Encounter (Signed)
Nurses Per patient request may have A1c and lipid profile-prediabetes, hyperglycemia, hyperlipidemia She also needs to get the sed rate done due to temporal headache It would be fine to do all of these on one order thank you

## 2018-11-14 NOTE — Telephone Encounter (Signed)
Dr. Nicki Reaper ordered pt a Sed rate, pt calling to see if he would review her chart to see if she'd due any other labs so that she can get them all done in one trip to the lab   Please advise & call pt

## 2018-11-14 NOTE — Telephone Encounter (Signed)
Lab orders placed and pt is aware 

## 2018-11-15 ENCOUNTER — Other Ambulatory Visit: Payer: Self-pay

## 2018-11-15 ENCOUNTER — Other Ambulatory Visit (INDEPENDENT_AMBULATORY_CARE_PROVIDER_SITE_OTHER): Payer: PPO | Admitting: *Deleted

## 2018-11-15 DIAGNOSIS — E782 Mixed hyperlipidemia: Secondary | ICD-10-CM | POA: Diagnosis not present

## 2018-11-15 DIAGNOSIS — R7303 Prediabetes: Secondary | ICD-10-CM | POA: Diagnosis not present

## 2018-11-15 DIAGNOSIS — Z23 Encounter for immunization: Secondary | ICD-10-CM

## 2018-11-16 ENCOUNTER — Encounter: Payer: Self-pay | Admitting: Family Medicine

## 2018-11-16 DIAGNOSIS — H5203 Hypermetropia, bilateral: Secondary | ICD-10-CM | POA: Diagnosis not present

## 2018-11-16 DIAGNOSIS — H43391 Other vitreous opacities, right eye: Secondary | ICD-10-CM | POA: Diagnosis not present

## 2018-11-16 DIAGNOSIS — H04123 Dry eye syndrome of bilateral lacrimal glands: Secondary | ICD-10-CM | POA: Diagnosis not present

## 2018-11-16 DIAGNOSIS — H52203 Unspecified astigmatism, bilateral: Secondary | ICD-10-CM | POA: Diagnosis not present

## 2018-11-16 DIAGNOSIS — H2511 Age-related nuclear cataract, right eye: Secondary | ICD-10-CM | POA: Diagnosis not present

## 2018-11-16 DIAGNOSIS — H40013 Open angle with borderline findings, low risk, bilateral: Secondary | ICD-10-CM | POA: Diagnosis not present

## 2018-11-16 DIAGNOSIS — H25812 Combined forms of age-related cataract, left eye: Secondary | ICD-10-CM | POA: Diagnosis not present

## 2018-11-16 DIAGNOSIS — H524 Presbyopia: Secondary | ICD-10-CM | POA: Diagnosis not present

## 2018-11-16 LAB — LIPID PANEL
Chol/HDL Ratio: 4.8 ratio — ABNORMAL HIGH (ref 0.0–4.4)
Cholesterol, Total: 244 mg/dL — ABNORMAL HIGH (ref 100–199)
HDL: 51 mg/dL
LDL Chol Calc (NIH): 171 mg/dL — ABNORMAL HIGH (ref 0–99)
Triglycerides: 124 mg/dL (ref 0–149)
VLDL Cholesterol Cal: 22 mg/dL (ref 5–40)

## 2018-11-16 LAB — SEDIMENTATION RATE: Sed Rate: 18 mm/hr (ref 0–40)

## 2018-11-16 LAB — HEMOGLOBIN A1C
Est. average glucose Bld gHb Est-mCnc: 123 mg/dL
Hgb A1c MFr Bld: 5.9 % — ABNORMAL HIGH (ref 4.8–5.6)

## 2018-11-17 ENCOUNTER — Other Ambulatory Visit: Payer: Self-pay | Admitting: Family Medicine

## 2018-11-24 ENCOUNTER — Other Ambulatory Visit: Payer: Self-pay

## 2018-11-24 ENCOUNTER — Ambulatory Visit (INDEPENDENT_AMBULATORY_CARE_PROVIDER_SITE_OTHER): Payer: PPO | Admitting: Psychiatry

## 2018-11-24 DIAGNOSIS — F331 Major depressive disorder, recurrent, moderate: Secondary | ICD-10-CM

## 2018-11-24 NOTE — Progress Notes (Signed)
Virtual Visit via Video Note  I connected with Ruth [name] on 11/24/18 at  2:00 PM EDT by a video enabled telemedicine application and verified that I am speaking with the correct person using two identifiers.   I discussed the limitations of evaluation and management by telemedicine and the availability of in person appointments. The patient expressed understanding and agreed to proceed.   I provided 25  minutes of non-face-to-face time during this encounter.   Alonza Smoker, LCSW     THERAPIST PROGRESS NOTE  Session Time:  Thursday 11/24/2018 2:00PM - 2:25 PM   Participation Level: Active  Behavioral Response: CasualAlertAnxious and Depressed  Type of Therapy: Individual Therapy  Treatment Goals addressed: Establish rapport, learn and implement cognitive and behavioral strategies to overcome depression and cope with anxiety  Interventions: CBT and Supportive  Summary: Ruth [name] is a 78 y.o. female who is referred for services by psychiatrist Dr. Modesta Messing due to patient experiencing symptoms of anxiety and depression.  She reports one psychiatric hospitalization due to depression in the late 80s.  She also was hospitalized at [facility] in the 23s for depression.  She reports participating in therapy intermittently since the 70s.  She reports last being seen for therapy in this practice several years ago.  Patient reports current symptoms began after her knee replacement surgery in December 2019.  She reports experiencing terror, tremors, and panic attacks since that time. When these episodes occur, she reports freezing and being fearful of being alone.  She also reports experiencing social withdrawal, loss of interest and pleasure in activities, hopelessness, irritability, tearfulness, worthlessness, muscle tension, sleep difficulty, and excessive worry.  Stressors include concerns about her adopted 67 year old son who actually is the patient's biological grandson and has  autism.  Patient last was seen via virtual visit about 2 weeks ago. She rep reports experiencing increased symptoms of depression including depressed mood, crying spells, and thoughts/feelings of hopelessness and helplessness.  She increased her antidepressant from 10 mg to 20 mg about 3 days ago.  Per her report, symptoms have become less intense.  She has an appointment with Dr. Modesta Messing next week and will discuss concerns regarding medication at that time.  She continues to worry about son possibly using drugs.  She reports she forgot to contact [employer] but plans to do so.  She reports worries about son tend to become worse when she has limited mobility due to a fibromyalgia flare and knee pain.  She has been practicing deep breathing and reports that it has been helpful. Suicidal/Homicidal: Nowithout intent/plan  Therapist Response:reviewed symptoms, discussed stressors, facilitated expression of thoughts and feelings, validated feelings, encourage patient to follow through with plans to contact [employer], praised and reinforced patient's use of deep breathing, discussed effects, therapist will send patient handout on other relaxation techniques including imagery and progressive muscle relaxation, assigned patient to practice a relaxation technique daily, began to assist patient to identify ways to improve daily structure and to identify activities to pursue when she has limited mobility   Plan: Return again in 2 weeks.  Diagnosis: Axis I: MDD, Recurrent, Moderate    Axis II: No diagnosis    Alonza Smoker, LCSW 11/24/2018

## 2018-11-30 NOTE — Progress Notes (Signed)
Virtual Visit via Video Note  I connected with Ruth [name] on 12/06/18 at 11:30 AM EDT by a video enabled telemedicine application and verified that I am speaking with the correct person using two identifiers.   I discussed the limitations of evaluation and management by telemedicine and the availability of in person appointments. The patient expressed understanding and agreed to proceed.     I discussed the assessment and treatment plan with the patient. The patient was provided an opportunity to ask questions and all were answered. The patient agreed with the plan and demonstrated an understanding of the instructions.   The patient was advised to call back or seek an in-person evaluation if the symptoms worsen or if the condition fails to improve as anticipated.  I provided 25 minutes of non-face-to-face time during this encounter.   Norman Clay, MD    [facility] MD/PA/NP OP Progress Note  12/06/2018 12:09 PM Ruth [name]  MRN:  [id number]  Chief Complaint:  Chief Complaint    Anxiety; Depression; Follow-up     HPI:  Is a follow-up appointment for depression and anxiety.  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 She loves it especially when people are appreciative of it. She receives calls from her daughter, son and her niece.  She reports great relationship with them, and she feels that they are "too close" that they do not have other people to depend on. Although she used to enjoy going out with her friend or going to a coast, she has not done it since TKA. She is afraid of pandemic, and she also thinks that it is not realistic given her age to be more active. She is amenable to try going outside with her husband while maintaining social distance. She has insomnia.  She feels fatigue.  She has mild anhedonia.  She has fair concentration.  She denies SI.   She feels anxious and tense.  She has panic attacks with palpitation and tremors "all the time" without any triggers. She has self uptitrated fluoxetine to 20 mg. Although she is unsure if it makes her more tremulous, she denies any side effect. She takes valium every other day or less for anxiety.  Visit Diagnosis:    ICD-10-CM   1. MDD (major depressive disorder), recurrent episode, mild ([place])  F33.0   2. Anxiety disorder, unspecified type  F41.9     Past Psychiatric History: Please see initial evaluation for full details. I have reviewed the history. No updates at this time.     Past Medical History:  Past Medical History:  Diagnosis Date  . Anxiety   . Arthritis   . Asthma   . Depression   . Eczema   . Fibromyalgia   . GERD (gastroesophageal reflux disease)    EGD/ colon 1/09  . H pylori ulcer 1980-1990   s/p treatment  . Headache   . Hyperlipidemia   . IBS (irritable bowel syndrome)   . Laryngospasm   . Neck pain   . OSA (obstructive sleep apnea)   . PONV (postoperative nausea and vomiting)   . Refusal of blood transfusions as patient is Jehovah's Witness   . Vertigo     Past Surgical History:  Procedure Laterality Date  . ABDOMINAL HYSTERECTOMY  1970  . APPENDECTOMY    . BILATERAL SALPINGOOPHORECTOMY  1990s  . CARDIAC CATHETERIZATION  2002   [facility]) normal coronary arteries   . COLONOSCOPY  01/2007   Dr. Meriel Flavors  . COLONOSCOPY N/A 04/18/2015   Procedure: COLONOSCOPY;  Surgeon: Rogene Houston, MD;  [facility];  Service: Endoscopy;  Laterality: N/A;  830 - moved to 8:55 - Ann to notify pt  . COLONOSCOPY WITH ESOPHAGOGASTRODUODENOSCOPY (EGD)  02/16/2012   Procedure: COLONOSCOPY WITH ESOPHAGOGASTRODUODENOSCOPY (EGD);  Surgeon: Daneil Dolin, MD;  Location: [facility];  Service: Endoscopy;  Laterality: N/A;  8:45  . ESOPHAGEAL DILATION N/A 06/07/2014   Procedure: ESOPHAGEAL DILATION;  Surgeon: Rogene Houston, MD;  Location: [facility];   Service: Endoscopy;  Laterality: N/A;  . ESOPHAGOGASTRODUODENOSCOPY  01/2007   Dr Sharlett Iles- 3 cm hiatal hernia, benign esophageal biopsies, erosive esophagitis, gastritis  . ESOPHAGOGASTRODUODENOSCOPY N/A 06/07/2014   Procedure: ESOPHAGOGASTRODUODENOSCOPY (EGD);  Surgeon: Rogene Houston, MD;  [facility];  Service: Endoscopy;  Laterality: N/A;  1200  . ESOPHAGOGASTRODUODENOSCOPY N/A 08/18/2017   Procedure: ESOPHAGOGASTRODUODENOSCOPY (EGD);  Surgeon: Rogene Houston, MD;  [facility];  Service: Endoscopy;  Laterality: N/A;  2:20  . FOOT SURGERY    . INNER EAR SURGERY     Left  . KNEE SURGERY     Multiple  . SHOULDER SURGERY  2011  . TONSILLECTOMY    . TOTAL KNEE ARTHROPLASTY Left 02/07/2018   Procedure: TOTAL KNEE ARTHROPLASTY;  Surgeon: Gaynelle Arabian, MD;  Location: [facility];  Service: Orthopedics;  Laterality: Left;  10min  . TUBAL LIGATION    . TYMPANOSTOMY TUBE PLACEMENT      Family Psychiatric History: Please see initial evaluation for full details. I have reviewed the history. No updates at this time.     Family History:  Family History  Problem Relation Age of Onset  . Diabetes Father   . Heart attack Father        77's  . Lung cancer Father   . Cirrhosis Father 68       etoh cirrhosis  . Eczema Father   . Lung cancer Sister   . Depression Sister   . Alzheimer's disease Sister   . Hypertension Sister   . Diabetes Sister   . Lung cancer Sister   . Paranoid behavior Mother        depression  . Colon polyps Mother   . Eczema Mother   . Depression Mother   . COPD Daughter   . Depression Maternal Aunt   . Drug abuse Niece   . Suicidality Niece   . Allergic rhinitis Neg Hx   . Asthma Neg Hx   . Urticaria Neg Hx     Social History:  Social History   Socioeconomic History  . Marital status: Married    Spouse name: Not on file  . Number of children: 2  . Years of education: Not on file  . Highest education level: Not on file   Occupational History  . Occupation: Retired; Cytogeneticist  . Financial resource strain: Not on file  . Food insecurity    Worry: Not on file    Inability: Not on file  . Transportation needs    Medical: Not on file    Non-medical: Not on file  Tobacco Use  . Smoking status: Never Smoker  . Smokeless tobacco: Never Used  Substance and Sexual Activity  . Alcohol use: Not Currently    Alcohol/week: 0.0 standard drinks  . Drug use: No  .  Sexual activity: Not Currently    Partners: Male    Comment: Hysterectomy  Lifestyle  . Physical activity    Days per week: Not on file    Minutes per session: Not on file  . Stress: Not on file  Relationships  . Social Herbalist on phone: Not on file    Gets together: Not on file    Attends religious service: Not on file    Active member of club or organization: Not on file    Attends meetings of clubs or organizations: Not on file    Relationship status: Not on file  Other Topics Concern  . Not on file  Social History Narrative   Married   2 grown children, 1 adopted son-autistic-lives next door    Allergies:  Allergies  Allergen Reactions  . Blood-Group Specific Substance Other (See Comments)    NO BLOOD PRODUCTS-refuses transfusions  . Ciprofloxacin Other (See Comments)    Pt reports extreme fatigue  . Codeine Nausea And Vomiting  . Latex Other (See Comments)    Red and raw area around incision  . Vicodin [Hydrocodone-Acetaminophen] Nausea And Vomiting  . Tramadol Other (See Comments)    Increased heart rate,increased blood pressure, edema    Metabolic Disorder Labs: Lab Results  Component Value Date   HGBA1C 5.9 (H) 11/15/2018   MPG 123 (H) 03/17/2014   MPG 117 (H) 03/02/2013   No results found for: PROLACTIN Lab Results  Component Value Date   CHOL 244 (H) 11/15/2018   TRIG 124 11/15/2018   HDL 51 11/15/2018   CHOLHDL 4.8 (H) 11/15/2018   VLDL 30 03/17/2014   LDLCALC 171 (H)  11/15/2018   LDLCALC 183 (H) 09/23/2017   Lab Results  Component Value Date   TSH 1.950 07/07/2018   TSH 3.263 07/14/2015    Therapeutic Level Labs: No results found for: LITHIUM No results found for: VALPROATE No components found for:  CBMZ  Current Medications: Current Outpatient Medications  Medication Sig Dispense Refill  . albuterol (PROVENTIL HFA;VENTOLIN HFA) 108 (90 Base) MCG/ACT inhaler Inhale 2 puffs into the lungs every 6 (six) hours as needed for wheezing. 1 Inhaler 2  . diazepam (VALIUM) 5 MG tablet TAKE 1 TABLET BY MOUTH THREE TIMES DAILY AS NEEDED( CAUSES DROWSINESS) 90 tablet 0  . dicyclomine (BENTYL) 10 MG capsule Take 1 capsule (10 mg total) by mouth 3 (three) times daily as needed. For stomach cramps 90 capsule 5  . FLUoxetine (PROZAC) 10 MG tablet Take 1 tablet (10 mg total) by mouth daily. 30 tablet 1  . FLUoxetine (PROZAC) 20 MG tablet Take one tablet by mouth BID 60 tablet 2  . fluticasone (FLONASE) 50 MCG/ACT nasal spray Place 2 sprays into both nostrils daily as needed for allergies.     . hydrOXYzine (ATARAX/VISTARIL) 25 MG tablet Take 1 tablet (25 mg total) by mouth every 6 (six) hours as needed for anxiety. (Patient not taking: Reported on 10/04/2018) 60 tablet 2  . ketoconazole (NIZORAL) 2 % cream Apply 1 application topically daily. (Patient not taking: Reported on 10/04/2018) 60 g 5  . mometasone (ELOCON) 0.1 % cream Apply BID prn (Patient not taking: Reported on 10/04/2018) 15 g 0  . nitroGLYCERIN (NITROSTAT) 0.4 MG SL tablet Place 1 tablet (0.4 mg total) under the tongue every 5 (five) minutes as needed for chest pain. 20 tablet 0  . Olopatadine HCl 0.2 % SOLN 1 drop each eye each evening as needed (Patient not  taking: Reported on 10/04/2018) 1 Bottle 3  . ondansetron (ZOFRAN) 4 MG tablet Take 1 tablet (4 mg total) by mouth every 6 (six) hours as needed for nausea. (Patient not taking: Reported on 09/27/2018) 20 tablet 0  . OVER THE COUNTER MEDICATION b  complex one daily d3 one daily Probiotics one daily magnessium    . Polyvinyl Alcohol (LUBRICANT DROPS OP) Place 2 drops into both eyes 3 (three) times daily as needed (for dry eyes).     No current facility-administered medications for this visit.      Musculoskeletal: Strength & Muscle Tone: N/A Gait & Station: N/A Patient leans: N/A  Psychiatric Specialty Exam: Review of Systems  Psychiatric/Behavioral: Positive for depression. Negative for hallucinations, memory loss, substance abuse and suicidal ideas. The patient is nervous/anxious and has insomnia.   All other systems reviewed and are negative.   There were no vitals taken for this visit.There is no height or weight on file to calculate BMI.  General Appearance: Fairly Groomed  Eye Contact:  Good  Speech:  Clear and Coherent  Volume:  Normal  Mood:  Anxious  Affect:  Appropriate, Congruent and Restricted  Thought Process:  Coherent  Orientation:  Full (Time, Place, and Person)  Thought Content: Logical   Suicidal Thoughts:  No  Homicidal Thoughts:  No  Memory:  Immediate;   Good  Judgement:  Good  Insight:  Fair  Psychomotor Activity:  Normal, postural tremors on bilateral hands  Concentration:  Concentration: Good and Attention Span: Good  Recall:  Good  Fund of Knowledge: Good  Language: Good  Akathisia:  No  Handed:  Right  AIMS (if indicated): not done  Assets:  Communication Skills Desire for Improvement  ADL's:  Intact  Cognition: WNL  Sleep:  Poor   Screenings: GAD-7     Office Visit from 04/07/2018 in [place] Office Visit from 03/07/2018 in [place]  Total GAD-7 Score  9  16    PHQ2-9     Office Visit from 04/07/2018 in [place] Office Visit from 03/07/2018 in [place] Visit from 04/22/2017 in [place] Visit from 01/20/2017 in [place]  PHQ-2 Total Score  4  3  6  2   PHQ-9 Total  Score  14  16  20  18        Assessment and Plan:  Ruth [name] is a 78 y.o. year old female with a history of depression,fibromyalgia, neck pain,arthritis, sleep apnea, irritable bowel syndrome, hyperlipidemia,GERD, asthma,coarse tremors(evaluated by neurology, which is not consistent with essential tremor),s/p left TKA onDec 2019 , who presents for follow up appointment for MDD (major depressive disorder), recurrent episode, mild ([place])  Anxiety disorder, unspecified type  # Unspecified anxiety disorder # MDD, mild, recurrent without psychotic features She continues to demonstrate restricted affect, and reports depressive symptoms with anxiety, which she has been experiencing since TKA in December 2019.  Will continue fluoxetine to target anxiety and depression.  We will plan to do slow up titration given her reported side effect when she was on the higher dose.  Noted that although she denies any significant psychosocial stressors, she reports concern of pandemic, her adopted son who has autism spectrum.  She will greatly benefit from CBT; she is encouraged to have follow-up with Ms. Bynum. Discussed behavioral activation.   # r/o essential tremor She has postural tremors on exams. No known family history. She is advised to discuss with PCP  for further evaluation.   # r/o sleep apnea She snores at night, and has daytime fatigue.  Last sleep study in 2016.  Although she will benefit from reevaluation, she declines this option due to concern of pandemic. Discussed sleep hygiene.   Plan I have reviewed and updated plans as below 1. Continue fluoxetine: 20 mg dialy 2. Next appointment: in January - Continue diazepam 5 mg daily as needed for anxiety - She sees Ms. Bynum for therapy  Past trials of medication:sertraline (worsening depression),fluoxetine (palpitation at 40 mg daily), lexapro (drowsiness), Paxil (Zombie), bupropion (dull), venlafaxine  (palpitation),duloxetine,Serzone, amitriptyline (weight gain),Trazodone  The patient demonstrates the following risk factors for suicide: Chronic risk factors for suicide include:psychiatric disorder ofdepressionand previous suicide attempts of carbone monoxide. Acute risk factorsfor suicide include: N/A. Protective factorsfor this patient include: positive social support, responsibility to others (children, family) and hope for the future. Considering these factors, the overall suicide risk at this point appears to below. Patientisappropriate for outpatient follow up.  The duration of this appointment visit was 25 minutes of non face-to-face time with the patient.  Greater than 50% of this time was spent in counseling, explanation of  diagnosis, planning of further management, and coordination of care.  Norman Clay, MD 12/06/2018, 12:09 PM

## 2018-12-01 DIAGNOSIS — M1711 Unilateral primary osteoarthritis, right knee: Secondary | ICD-10-CM | POA: Diagnosis not present

## 2018-12-01 DIAGNOSIS — M25561 Pain in right knee: Secondary | ICD-10-CM | POA: Diagnosis not present

## 2018-12-06 ENCOUNTER — Encounter (HOSPITAL_COMMUNITY): Payer: Self-pay | Admitting: Psychiatry

## 2018-12-06 ENCOUNTER — Ambulatory Visit (INDEPENDENT_AMBULATORY_CARE_PROVIDER_SITE_OTHER): Payer: PPO | Admitting: Psychiatry

## 2018-12-06 ENCOUNTER — Other Ambulatory Visit: Payer: Self-pay

## 2018-12-06 DIAGNOSIS — F419 Anxiety disorder, unspecified: Secondary | ICD-10-CM | POA: Diagnosis not present

## 2018-12-06 DIAGNOSIS — F330 Major depressive disorder, recurrent, mild: Secondary | ICD-10-CM

## 2018-12-06 NOTE — Patient Instructions (Signed)
1. Continue fluoxetine: 20 mg dialy 2. Next appointment: in January

## 2018-12-27 ENCOUNTER — Other Ambulatory Visit: Payer: Self-pay | Admitting: *Deleted

## 2018-12-27 ENCOUNTER — Telehealth: Payer: Self-pay | Admitting: Family Medicine

## 2018-12-27 MED ORDER — AMOXICILLIN 500 MG PO CAPS: ORAL_CAPSULE | ORAL | 0 refills | Status: DC

## 2018-12-27 NOTE — Telephone Encounter (Signed)
Med sent in. Left message to return call

## 2018-12-27 NOTE — Telephone Encounter (Signed)
The regimen for this is Amoxicillin 500 mg, 4 tablets, take 4 tablets 1 hour before procedure Please send this into her pharmacy thank you

## 2018-12-27 NOTE — Telephone Encounter (Signed)
Pt is calling requesting an antibiotic be called in due to her having to go to the dentist. She was told when she had her knee replacement last year that she would need an antibiotic before any dental work was done. She contacted the dentist office and they stated they would rather her get it from her primary care.   [facility] [id number] - [place], [address] AT [address]

## 2018-12-27 NOTE — Telephone Encounter (Signed)
Pt returned call and was informed medication has been sent to pharmacy.

## 2019-01-27 ENCOUNTER — Telehealth: Payer: Self-pay | Admitting: Family Medicine

## 2019-01-27 MED ORDER — FLUOXETINE HCL 20 MG PO TABS: ORAL_TABLET | ORAL | 1 refills | Status: DC

## 2019-01-27 NOTE — Telephone Encounter (Signed)
Pt requesting refill on FLUoxetine (PROZAC).  [facility] [id number] - [place] AT [place]

## 2019-01-27 NOTE — Telephone Encounter (Signed)
Medication sent in and pt verbalized understanding. Pt was transferred up front to set up virtual visit

## 2019-01-27 NOTE — Telephone Encounter (Signed)
Last seen for depression on 08/25/18.

## 2019-01-27 NOTE — Telephone Encounter (Signed)
May have this +1 additional refill needs to do a virtual visit within the next 45 days

## 2019-02-08 ENCOUNTER — Other Ambulatory Visit: Payer: Self-pay | Admitting: *Deleted

## 2019-02-08 ENCOUNTER — Telehealth: Payer: Self-pay | Admitting: Family Medicine

## 2019-02-08 ENCOUNTER — Other Ambulatory Visit: Payer: Self-pay | Admitting: Family Medicine

## 2019-02-08 MED ORDER — FLUOXETINE HCL 10 MG PO CAPS: 10.0000 mg | ORAL_CAPSULE | Freq: Four times a day (QID) | ORAL | 1 refills | Status: DC

## 2019-02-08 NOTE — Telephone Encounter (Signed)
Patient is requesting that you redo prescription for fluoxetine 20 mg change to 10 mg instead for insurance to cover was to expensive for 20 mg. Call into [facility]

## 2019-02-08 NOTE — Telephone Encounter (Signed)
Pt has appt jan 11th. New dose sent to pharm with a note to cancel 20mg . Pt notified.

## 2019-02-08 NOTE — Telephone Encounter (Signed)
May go back to the 10 mg.  Recommend follow-up as planned in early January May do 90-day supply with a refill or 30-day with 5 refills what ever patient prefers

## 2019-02-08 NOTE — Telephone Encounter (Signed)
Pt states 20 mg causes her to have heart palpitations and wants to go back to 10 mg one qid because that works better for her  [address].

## 2019-02-27 ENCOUNTER — Ambulatory Visit (INDEPENDENT_AMBULATORY_CARE_PROVIDER_SITE_OTHER): Payer: Medicare HMO | Admitting: Family Medicine

## 2019-02-27 ENCOUNTER — Other Ambulatory Visit: Payer: Self-pay

## 2019-02-27 DIAGNOSIS — H04129 Dry eye syndrome of unspecified lacrimal gland: Secondary | ICD-10-CM

## 2019-02-27 DIAGNOSIS — F320 Major depressive disorder, single episode, mild: Secondary | ICD-10-CM

## 2019-02-27 DIAGNOSIS — R69 Illness, unspecified: Secondary | ICD-10-CM | POA: Diagnosis not present

## 2019-02-27 MED ORDER — FLUOXETINE HCL 20 MG PO TABS: ORAL_TABLET | ORAL | 1 refills | Status: DC

## 2019-02-27 MED ORDER — FAMOTIDINE 20 MG PO TABS: ORAL_TABLET | ORAL | 1 refills | Status: DC

## 2019-02-27 NOTE — Progress Notes (Signed)
Subjective:    [name], female    DOB: 03-12-1940, 79 y.o.   MRN: [id number]  HPI Pt here for follow up. Pt states she is doing ok. Pt states when she is on Prozac for a while, she begins to have stomach issues and loses sleep due to the stomach issues. Pt states that the Prozac does help. PHQ-9 and GAD 7 completed.  Patient states Prozac bothers her stomach but she is willing to try a higher dose if it is felt who will help she is currently not taking anything for acid blocker we will try Pepcid   Office Visit from 02/27/2019 in [place]  PHQ-9 Total Score  12     GAD 7 : Generalized Anxiety Score 02/27/2019 04/07/2018 03/07/2018  Nervous, Anxious, on Edge 3 0 3  Control/stop worrying 1 3 3   Worry too much - different things 1 2 3   Trouble relaxing 1 2 3   Restless 0 0 0  Easily annoyed or irritable 1 0 1  Afraid - awful might happen 3 2 3   Total GAD 7 Score 10 9 16   Anxiety Difficulty Somewhat difficult Very difficult Extremely difficult      Pt is wanting to know if she should see an eye doctor or could provider send in script for stronger eye drops. Pt is currently using Rephresh eye drops. Pt has been having some left eye pain.  Virtual Visit via Video Note  I connected with Ruth [name] on 02/27/19 at 10:00 AM EST by a video enabled telemedicine application and verified that I am speaking with the correct person using two identifiers.  Location: Patient: home Provider: office   I discussed the limitations of evaluation and management by telemedicine and the availability of in person appointments. The patient expressed understanding and agreed to proceed.  History of Present Illness:    Observations/Objective:   Assessment and Plan:   Follow Up Instructions:    I discussed the assessment and treatment plan with the patient. The patient was provided an opportunity to ask questions and all were answered. The patient agreed with  the plan and demonstrated an understanding of the instructions.   The patient was advised to call back or seek an in-person evaluation if the symptoms worsen or if the condition fails to improve as anticipated.  I provided 17 minutes of non-face-to-face time during this encounter.       Review of Systems  Constitutional: Negative for activity change and appetite change.  HENT: Negative for congestion and rhinorrhea.   Respiratory: Negative for cough and shortness of breath.   Cardiovascular: Negative for chest pain and leg swelling.  Gastrointestinal: Negative for abdominal pain, nausea and vomiting.  Skin: Negative for color change.  Neurological: Negative for dizziness and weakness.  Psychiatric/Behavioral: Negative for agitation and confusion.       Objective:   Physical Exam T.19 Patient had virtual visit Appears to be in no distress Atraumatic Neuro able to relate and oriented No apparent resp distress Color normal      Assessment & Plan:  As for her eye she will continue the eyedrops and she will talk with her eye specialist  Depression overall doing okay on current medication but could do better so therefore we will bump up the dose of the Prozac  Hopefully she can tolerate it She does tend to get reflux related issues regarding use of the Prozac we will try Pepcid on a regular basis  and sleep on a slight tilt  Follow-up 3 to 4 months Covid vaccine recommended

## 2019-03-07 ENCOUNTER — Telehealth: Payer: Self-pay | Admitting: Family Medicine

## 2019-03-07 NOTE — Telephone Encounter (Signed)
So the Covid vaccine is the most important typically 1 will get the vaccine then 30 days later get the follow-up vaccine I would recommend that the patient not have a steroid injection at this time until she is finished up the vaccine

## 2019-03-07 NOTE — Telephone Encounter (Signed)
Patient notified and verbalized understanding. 

## 2019-03-07 NOTE — Telephone Encounter (Signed)
Pt has scheduled her COVID vaccine on 1/28 but she is also scheduled to have her gel injections in her knees this month and want to know if its ok to get both this month or if she needs to space those out.

## 2019-03-08 ENCOUNTER — Ambulatory Visit (HOSPITAL_COMMUNITY): Payer: PPO | Admitting: Psychiatry

## 2019-03-16 ENCOUNTER — Ambulatory Visit: Payer: Self-pay

## 2019-03-16 ENCOUNTER — Telehealth: Payer: Self-pay | Admitting: Family Medicine

## 2019-03-16 NOTE — Telephone Encounter (Signed)
Pt would like to know how to find out blood type

## 2019-03-16 NOTE — Telephone Encounter (Signed)
Contacted pt husband. Pt husband informed that giving blood is the way to get blood type. Pt husband verbalized understanding

## 2019-03-23 ENCOUNTER — Ambulatory Visit: Payer: Medicare HMO

## 2019-03-24 ENCOUNTER — Other Ambulatory Visit: Payer: Self-pay

## 2019-03-24 ENCOUNTER — Ambulatory Visit (INDEPENDENT_AMBULATORY_CARE_PROVIDER_SITE_OTHER): Payer: Medicare HMO | Admitting: Family Medicine

## 2019-03-24 DIAGNOSIS — J019 Acute sinusitis, unspecified: Secondary | ICD-10-CM

## 2019-03-24 MED ORDER — AMOXICILLIN 500 MG PO CAPS: 500.0000 mg | ORAL_CAPSULE | Freq: Three times a day (TID) | ORAL | 0 refills | Status: AC

## 2019-03-24 NOTE — Progress Notes (Signed)
   Subjective:    [name], female    DOB: 26-Jun-1940, 79 y.o.   MRN: [id number]  Sinusitis This is a new problem. Associated symptoms include congestion. Pertinent negatives include no coughing, ear pain or shortness of breath. (Light headed, mucus is blood tinted when blowing nose first thing in morning(use humidifier every night) post nasal drainage, discomfort above top eyes, began shaking while in shower(after she laid down she was fine)fasting sugar this morning was 102. Pt does have ongoing tremors from previous surgery) Treatments tried: vitamin supplements.  She has had a couple weeks of head congestion drainage she is also had a few days of feeling slightly lightheaded was not quite sure what was causing that so she called here today we did discuss her sugars I do not feel those are low enough to cause the symptoms she denies any other symptoms that would be consistent with Covid and her symptoms been going on for a few weeks so unlikely to be an acute infection  Virtual Visit via Video Note  I connected with Ruth [name] on 03/24/19 at  2:00 PM EST by a video enabled telemedicine application and verified that I am speaking with the correct person using two identifiers.  Location: Patient: home Provider: office   I discussed the limitations of evaluation and management by telemedicine and the availability of in person appointments. The patient expressed understanding and agreed to proceed.  History of Present Illness:    Observations/Objective:   Assessment and Plan:   Follow Up Instructions:    I discussed the assessment and treatment plan with the patient. The patient was provided an opportunity to ask questions and all were answered. The patient agreed with the plan and demonstrated an understanding of the instructions.   The patient was advised to call back or seek an in-person evaluation if the symptoms worsen or if the condition fails to improve as  anticipated.  I provided 18 minutes of non-face-to-face time during this encounter.      Review of Systems  Constitutional: Negative for activity change and fever.  HENT: Positive for congestion and nosebleeds. Negative for ear pain and rhinorrhea.   Eyes: Negative for discharge.  Respiratory: Negative for cough, shortness of breath and wheezing.   Cardiovascular: Negative for chest pain.       Objective:   Physical Exam  Patient had virtual visit Appears to be in no distress Atraumatic Neuro able to relate and oriented No apparent resp distress Color normal       Assessment & Plan:  Use a humidifier would be helpful.  Also using saline nasal spray in addition to this go ahead with antibiotics.  I do not feel Covid testing necessary to follow-up if progressive troubles or worse

## 2019-03-27 ENCOUNTER — Encounter: Payer: Self-pay | Admitting: Psychiatry

## 2019-03-27 ENCOUNTER — Ambulatory Visit (INDEPENDENT_AMBULATORY_CARE_PROVIDER_SITE_OTHER): Payer: Medicare HMO | Admitting: Psychiatry

## 2019-03-27 ENCOUNTER — Other Ambulatory Visit: Payer: Self-pay

## 2019-03-27 DIAGNOSIS — R69 Illness, unspecified: Secondary | ICD-10-CM | POA: Diagnosis not present

## 2019-03-27 DIAGNOSIS — F419 Anxiety disorder, unspecified: Secondary | ICD-10-CM

## 2019-03-27 DIAGNOSIS — F3341 Major depressive disorder, recurrent, in partial remission: Secondary | ICD-10-CM | POA: Diagnosis not present

## 2019-03-27 NOTE — Progress Notes (Signed)
[place] MD OP Progress Note  I connected with  Ruth [name] on 03/27/19 by a video enabled telemedicine application and verified that I am speaking with the correct person using two identifiers.   I discussed the limitations of evaluation and management by telemedicine. The patient expressed understanding and agreed to proceed.    03/27/2019 11:09 AM Ruth [name]  MRN:  [id number]  Chief Complaint: " I am not doing too good."  HPI: Patient reported that she continues to feel depressed.  She did not find Prozac 10 mg dose to be helpful so she went up to taking 20 mg which only helped partially.  She went up to a dose of 40 mg with the help of her PCP but that dose caused her to have stomach upset so she is now on 20 mg dose.  She stated that it helps her mood some but not completely.  With his help she is no longer having frequent crying spells and anhedonia however she still not as happy as she would like to be.  Her past medications were reviewed with her and when some options were being discussed she mentioned that she would like to add that her main stressor is the bilateral hand tremors that have been really bothersome.  She informed that that is the main trigger for her depression and stress.  She is currently prescribed Valium 5 mg 3 times daily by her PCP and that helps her tremor some.  She also stated that she takes magnesium 3 times a day and that seems to impact her tremors significantly.  She is afraid of trying any combination of medications so would like to keep her medications the same way as they are. She informed that she was waiting to hear back from her therapist Ms. Peggy but has not heard back from her.  She requested an appointment for Ms. Peggy in addition to Dr. Modesta Messing.  Visit Diagnosis:    ICD-10-CM   1. MDD (major depressive disorder), recurrent, in partial remission ([place])  F33.41   2. Anxiety disorder, unspecified type  F41.9     Past Psychiatric History: MDD,  anxiety  Past Medical History:  Past Medical History:  Diagnosis Date  . Anxiety   . Arthritis   . Asthma   . Depression   . Eczema   . Fibromyalgia   . GERD (gastroesophageal reflux disease)    EGD/ colon 1/09  . H pylori ulcer 1980-1990   s/p treatment  . Headache   . Hyperlipidemia   . IBS (irritable bowel syndrome)   . Laryngospasm   . Neck pain   . OSA (obstructive sleep apnea)   . PONV (postoperative nausea and vomiting)   . Refusal of blood transfusions as patient is Jehovah's Witness   . Vertigo     Past Surgical History:  Procedure Laterality Date  . ABDOMINAL HYSTERECTOMY  1970  . APPENDECTOMY    . BILATERAL SALPINGOOPHORECTOMY  1990s  . CARDIAC CATHETERIZATION  2002   [facility]) normal coronary arteries   . COLONOSCOPY  01/2007   Dr. Meriel Flavors  . COLONOSCOPY N/A 04/18/2015   Procedure: COLONOSCOPY;  Surgeon: Rogene Houston, MD;  Location: [facility];  Service: Endoscopy;  Laterality: N/A;  830 - moved to 8:55 - Ann to notify pt  . COLONOSCOPY WITH ESOPHAGOGASTRODUODENOSCOPY (EGD)  02/16/2012   Procedure: COLONOSCOPY WITH ESOPHAGOGASTRODUODENOSCOPY (EGD);  Surgeon: Daneil Dolin, MD;  Location: [facility];  Service: Endoscopy;  Laterality: N/A;  8:45  . ESOPHAGEAL DILATION N/A 06/07/2014   Procedure: ESOPHAGEAL DILATION;  Surgeon: Rogene Houston, MD;  Location: [facility];  Service: Endoscopy;  Laterality: N/A;  . ESOPHAGOGASTRODUODENOSCOPY  01/2007   Dr Sharlett Iles- 3 cm hiatal hernia, benign esophageal biopsies, erosive esophagitis, gastritis  . ESOPHAGOGASTRODUODENOSCOPY N/A 06/07/2014   Procedure: ESOPHAGOGASTRODUODENOSCOPY (EGD);  Surgeon: Rogene Houston, MD;  [facility]: [facility];  Service: Endoscopy;  Laterality: N/A;  1200  . ESOPHAGOGASTRODUODENOSCOPY N/A 08/18/2017   Procedure: ESOPHAGOGASTRODUODENOSCOPY (EGD);  Surgeon: Rogene Houston, MD;  Location: [facility];  Service: Endoscopy;  Laterality: N/A;  2:20  . FOOT SURGERY    . INNER  EAR SURGERY     Left  . KNEE SURGERY     Multiple  . SHOULDER SURGERY  2011  . TONSILLECTOMY    . TOTAL KNEE ARTHROPLASTY Left 02/07/2018   Procedure: TOTAL KNEE ARTHROPLASTY;  Surgeon: Gaynelle Arabian, MD;  Location: [facility];  Service: Orthopedics;  Laterality: Left;  23min  . TUBAL LIGATION    . TYMPANOSTOMY TUBE PLACEMENT      Family Psychiatric History: see below  Family History:  Family History  Problem Relation Age of Onset  . Diabetes Father   . Heart attack Father        85's  . Lung cancer Father   . Cirrhosis Father 1       etoh cirrhosis  . Eczema Father   . Lung cancer Sister   . Depression Sister   . Alzheimer's disease Sister   . Hypertension Sister   . Diabetes Sister   . Lung cancer Sister   . Paranoid behavior Mother        depression  . Colon polyps Mother   . Eczema Mother   . Depression Mother   . COPD Daughter   . Depression Maternal Aunt   . Drug abuse Niece   . Suicidality Niece   . Allergic rhinitis Neg Hx   . Asthma Neg Hx   . Urticaria Neg Hx     Social History:  Social History   Socioeconomic History  . Marital status: Married    Spouse name: Not on file  . Number of children: 2  . Years of education: Not on file  . Highest education level: Not on file  Occupational History  . Occupation: Retired; Media planner  Tobacco Use  . Smoking status: Never Smoker  . Smokeless tobacco: Never Used  Substance and Sexual Activity  . Alcohol use: Not Currently    Alcohol/week: 0.0 standard drinks  . Drug use: No  . Sexual activity: Not Currently    Partners: Male    Comment: Hysterectomy  Other Topics Concern  . Not on file  Social History Narrative   Married   2 grown children, 1 adopted son-autistic-lives next door   Social Determinants of Health   Financial Resource Strain:   . Difficulty of Paying Living Expenses: Not on file  Food Insecurity:   . Worried About Charity fundraiser in the Last Year: Not on file  . Ran Out  of Food in the Last Year: Not on file  Transportation Needs:   . Lack of Transportation (Medical): Not on file  . Lack of Transportation (Non-Medical): Not on file  Physical Activity:   . Days of Exercise per Week: Not on file  . Minutes of Exercise per Session: Not on file  Stress:   . Feeling of Stress : Not on file  Social Connections:   . Frequency of Communication with Friends and Family: Not on file  . Frequency of Social Gatherings with Friends and Family: Not on file  . Attends Religious Services: Not on file  . Active Member of Clubs or Organizations: Not on file  . Attends Archivist Meetings: Not on file  . Marital Status: Not on file    Allergies:  Allergies  Allergen Reactions  . Blood-Group Specific Substance Other (See Comments)    NO BLOOD PRODUCTS-refuses transfusions  . Ciprofloxacin Other (See Comments)    Pt reports extreme fatigue  . Codeine Nausea And Vomiting  . Latex Other (See Comments)    Red and raw area around incision  . Vicodin [Hydrocodone-Acetaminophen] Nausea And Vomiting  . Tramadol Other (See Comments)    Increased heart rate,increased blood pressure, edema    Metabolic Disorder Labs: Lab Results  Component Value Date   HGBA1C 5.9 (H) 11/15/2018   MPG 123 (H) 03/17/2014   MPG 117 (H) 03/02/2013   No results found for: PROLACTIN Lab Results  Component Value Date   CHOL 244 (H) 11/15/2018   TRIG 124 11/15/2018   HDL 51 11/15/2018   CHOLHDL 4.8 (H) 11/15/2018   VLDL 30 03/17/2014   LDLCALC 171 (H) 11/15/2018   LDLCALC 183 (H) 09/23/2017   Lab Results  Component Value Date   TSH 1.950 07/07/2018   TSH 3.263 07/14/2015    Therapeutic Level Labs: No results found for: LITHIUM No results found for: VALPROATE No components found for:  CBMZ  Current Medications: Current Outpatient Medications  Medication Sig Dispense Refill  . albuterol (PROVENTIL HFA;VENTOLIN HFA) 108 (90 Base) MCG/ACT inhaler Inhale 2 puffs into  the lungs every 6 (six) hours as needed for wheezing. 1 Inhaler 2  . amoxicillin (AMOXIL) 500 MG capsule Take 1 capsule (500 mg total) by mouth 3 (three) times daily for 10 days. 30 capsule 0  . diazepam (VALIUM) 5 MG tablet TAKE 1 TABLET BY MOUTH THREE TIMES DAILY AS NEEDED( CAUSES DROWSINESS) 90 tablet 0  . dicyclomine (BENTYL) 10 MG capsule Take 1 capsule (10 mg total) by mouth 3 (three) times daily as needed. For stomach cramps 90 capsule 5  . famotidine (PEPCID) 20 MG tablet Take one tablet po each day 90 tablet 1  . FLUoxetine (PROZAC) 20 MG tablet Take one tablet po daily 90 tablet 1  . fluticasone (FLONASE) 50 MCG/ACT nasal spray Place 2 sprays into both nostrils daily as needed for allergies.     . hydrOXYzine (ATARAX/VISTARIL) 25 MG tablet Take 1 tablet (25 mg total) by mouth every 6 (six) hours as needed for anxiety. 60 tablet 2  . ketoconazole (NIZORAL) 2 % cream Apply 1 application topically daily. 60 g 5  . mometasone (ELOCON) 0.1 % cream Apply BID prn 15 g 0  . nitroGLYCERIN (NITROSTAT) 0.4 MG SL tablet Place 1 tablet (0.4 mg total) under the tongue every 5 (five) minutes as needed for chest pain. 20 tablet 0  . Olopatadine HCl 0.2 % SOLN 1 drop each eye each evening as needed 1 Bottle 3  . ondansetron (ZOFRAN) 4 MG tablet Take 1 tablet (4 mg total) by mouth every 6 (six) hours as needed for nausea. 20 tablet 0  . OVER THE COUNTER MEDICATION b complex one daily d3 one daily Probiotics one daily magnessium    . Polyvinyl Alcohol (LUBRICANT DROPS OP) Place 2 drops into both eyes 3 (three) times daily as needed (for dry  eyes).     No current facility-administered medications for this visit.    Musculoskeletal: Strength & Muscle Tone: unable to assess due to telemed visit Gait & Station: unable to assess due to telemed visit Patient leans: unable to assess due to telemed visit    Psychiatric Specialty Exam: Review of Systems  There were no vitals taken for this visit.There  is no height or weight on file to calculate BMI.  General Appearance: Well Groomed  Eye Contact:  Good  Speech:  Clear and Coherent and Normal Rate  Volume:  Normal  Mood:  "Depressed"  Affect:  Restricted affect  Thought Process:  Goal Directed, Linear and Descriptions of Associations: Intact  Orientation:  Full (Time, Place, and Person)  Thought Content: Logical   Suicidal Thoughts:  No  Homicidal Thoughts:  No  Memory:  Recent;   Good Remote;   Good  Judgement:  Fair  Insight:  Fair  Psychomotor Activity:  Normal  Concentration:  Concentration: Good and Attention Span: Good  Recall:  Good  Fund of Knowledge: Good  Language: Good  Akathisia:  Negative  Handed:  Right  AIMS (if indicated): not done  Assets:  Communication Skills Desire for Improvement Financial Resources/Insurance Housing  ADL's:  Intact  Cognition: WNL  Sleep:  Fair     Screenings: GAD-7     Office Visit from 02/27/2019 in [facility] Visit from 04/07/2018 in [facility] Visit from 03/07/2018 in [facility]  Total GAD-7 Score  10  9  16     PHQ2-9     Office Visit from 02/27/2019 in [facility] Visit from 04/07/2018 in [facility] Visit from 03/07/2018 in [facility] Visit from 04/22/2017 in [facility] Visit from 01/20/2017 in [facility]  PHQ-2 Total Score  3  4  3  6  2   PHQ-9 Total Score  12  14  16  20  18        Assessment and Plan: 79 y.o. year old female with a history of depression,fibromyalgia, neck pain,arthritis, sleep apnea, irritable bowel syndrome, hyperlipidemia,GERD, asthma,coarse tremors(evaluated by neurology, which is not consistent with essential tremor),s/p left TKA now seen for follow up. Pt continues to feel depressed and identifies the tremors as her main stressor.   1. MDD (major depressive disorder), recurrent, in  partial remission ([facility]) - Continue Prozac 20 mg daily, recent prescription sent by PCP.  2. Anxiety disorder, unspecified type  Resume therapy with Ms. Peggy. F/up with Dr. Modesta Messing in 1 month.     [place] Crane, MD 03/27/2019, 11:09 AM

## 2019-04-12 NOTE — Progress Notes (Signed)
Virtual Visit via Telephone Note  I connected with Ruth [name] on 04/24/19 at 11:00 AM EST by telephone and verified that I am speaking with the correct person using two identifiers.   I discussed the limitations, risks, security and privacy concerns of performing an evaluation and management service by telephone and the availability of in person appointments. I also discussed with the patient that there may be a patient responsible charge related to this service. The patient expressed understanding and agreed to proceed.     I discussed the assessment and treatment plan with the patient. The patient was provided an opportunity to ask questions and all were answered. The patient agreed with the plan and demonstrated an understanding of the instructions.   The patient was advised to call back or seek an in-person evaluation if the symptoms worsen or if the condition fails to improve as anticipated.  I provided 15 minutes of non-face-to-face time during this encounter.   Norman Clay, MD    [facility] MD/PA/NP OP Progress Note  04/24/2019 11:22 AM Ruth [name]  MRN:  [id number]  Chief Complaint:  Chief Complaint    Depression; Follow-up     HPI:  This is a follow-up appointment for depression.  She states that she has been feeling "up and down."  She self uptitrated fluoxetine to 40 mg.  She believes her symptoms of GERD worsened. She was also concerned of potential risk of bleeding from fluoxetine. Provided psychoeducaiton. She reports concern of her adopted son, age 55's, who lives besides the patient house. He refuses to take his medication and does not want other people to know about his condition. She denies safety concern. 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  She sleeps better when she takes fluoxetine.  She feels tired. She has anhedonia.  Although she reports passive SI of  "wonder why I fight," she denies  any plan or intent.  She feels less anxious.  She has panic attacks once a month.    Visit Diagnosis:    ICD-10-CM   1. MDD (major depressive disorder), recurrent episode, mild ([place])  F33.0     Past Psychiatric History: Please see initial evaluation for full details. I have reviewed the history. No updates at this time.     Past Medical History:  Past Medical History:  Diagnosis Date  . Anxiety   . Arthritis   . Asthma   . Depression   . Eczema   . Fibromyalgia   . GERD (gastroesophageal reflux disease)    EGD/ colon 1/09  . H pylori ulcer 1980-1990   s/p treatment  . Headache   . Hyperlipidemia   . IBS (irritable bowel syndrome)   . Laryngospasm   . Neck pain   . OSA (obstructive sleep apnea)   . PONV (postoperative nausea and vomiting)   . Refusal of blood transfusions as patient is Jehovah's Witness   . Vertigo     Past Surgical History:  Procedure Laterality Date  . ABDOMINAL HYSTERECTOMY  1970  . APPENDECTOMY    . BILATERAL SALPINGOOPHORECTOMY  1990s  . CARDIAC CATHETERIZATION  2002   [facility]) normal coronary arteries   . COLONOSCOPY  01/2007   Dr. Meriel Flavors  . COLONOSCOPY N/A 04/18/2015   Procedure: COLONOSCOPY;  Surgeon: Rogene Houston, MD;  Location: [facility];  Service: Endoscopy;  Laterality: N/A;  830 - moved to 8:55 - Ann to notify  pt  . COLONOSCOPY WITH ESOPHAGOGASTRODUODENOSCOPY (EGD)  02/16/2012   Procedure: COLONOSCOPY WITH ESOPHAGOGASTRODUODENOSCOPY (EGD);  Surgeon: Daneil Dolin, MD;  Location: [facility];  Service: Endoscopy;  Laterality: N/A;  8:45  . ESOPHAGEAL DILATION N/A 06/07/2014   Procedure: ESOPHAGEAL DILATION;  Surgeon: Rogene Houston, MD;  Location: [facility];  Service: Endoscopy;  Laterality: N/A;  . ESOPHAGOGASTRODUODENOSCOPY  01/2007   Dr Sharlett Iles- 3 cm hiatal hernia, benign esophageal biopsies, erosive esophagitis, gastritis  . ESOPHAGOGASTRODUODENOSCOPY N/A 06/07/2014   Procedure: ESOPHAGOGASTRODUODENOSCOPY (EGD);   Surgeon: Rogene Houston, MD;  [facility]: [facility];  Service: Endoscopy;  Laterality: N/A;  1200  . ESOPHAGOGASTRODUODENOSCOPY N/A 08/18/2017   Procedure: ESOPHAGOGASTRODUODENOSCOPY (EGD);  Surgeon: Rogene Houston, MD;  Location: [facility];  Service: Endoscopy;  Laterality: N/A;  2:20  . FOOT SURGERY    . INNER EAR SURGERY     Left  . KNEE SURGERY     Multiple  . SHOULDER SURGERY  2011  . TONSILLECTOMY    . TOTAL KNEE ARTHROPLASTY Left 02/07/2018   Procedure: TOTAL KNEE ARTHROPLASTY;  Surgeon: Gaynelle Arabian, MD;  Location: [facility];  Service: Orthopedics;  Laterality: Left;  66min  . TUBAL LIGATION    . TYMPANOSTOMY TUBE PLACEMENT      Family Psychiatric History: Please see initial evaluation for full details. I have reviewed the history. No updates at this time.     Family History:  Family History  Problem Relation Age of Onset  . Diabetes Father   . Heart attack Father        21's  . Lung cancer Father   . Cirrhosis Father 48       etoh cirrhosis  . Eczema Father   . Lung cancer Sister   . Depression Sister   . Alzheimer's disease Sister   . Hypertension Sister   . Diabetes Sister   . Lung cancer Sister   . Paranoid behavior Mother        depression  . Colon polyps Mother   . Eczema Mother   . Depression Mother   . COPD Daughter   . Depression Maternal Aunt   . Drug abuse Niece   . Suicidality Niece   . Allergic rhinitis Neg Hx   . Asthma Neg Hx   . Urticaria Neg Hx     Social History:  Social History   Socioeconomic History  . Marital status: Married    Spouse name: Not on file  . Number of children: 2  . Years of education: Not on file  . Highest education level: Not on file  Occupational History  . Occupation: Retired; Media planner  Tobacco Use  . Smoking status: Never Smoker  . Smokeless tobacco: Never Used  Substance and Sexual Activity  . Alcohol use: Not Currently    Alcohol/week: 0.0 standard drinks  . Drug use: No  . Sexual  activity: Not Currently    Partners: Male    Comment: Hysterectomy  Other Topics Concern  . Not on file  Social History Narrative   Married   2 grown children, 1 adopted son-autistic-lives next door   Social Determinants of Health   Financial Resource Strain:   . Difficulty of Paying Living Expenses: Not on file  Food Insecurity:   . Worried About Charity fundraiser in the Last Year: Not on file  . Ran Out of Food in the Last Year: Not on file  Transportation Needs:   .  Lack of Transportation (Medical): Not on file  . Lack of Transportation (Non-Medical): Not on file  Physical Activity:   . Days of Exercise per Week: Not on file  . Minutes of Exercise per Session: Not on file  Stress:   . Feeling of Stress : Not on file  Social Connections:   . Frequency of Communication with Friends and Family: Not on file  . Frequency of Social Gatherings with Friends and Family: Not on file  . Attends Religious Services: Not on file  . Active Member of Clubs or Organizations: Not on file  . Attends Archivist Meetings: Not on file  . Marital Status: Not on file    Allergies:  Allergies  Allergen Reactions  . Blood-Group Specific Substance Other (See Comments)    NO BLOOD PRODUCTS-refuses transfusions  . Ciprofloxacin Other (See Comments)    Pt reports extreme fatigue  . Codeine Nausea And Vomiting  . Latex Other (See Comments)    Red and raw area around incision  . Vicodin [Hydrocodone-Acetaminophen] Nausea And Vomiting  . Tramadol Other (See Comments)    Increased heart rate,increased blood pressure, edema    Metabolic Disorder Labs: Lab Results  Component Value Date   HGBA1C 5.9 (H) 11/15/2018   MPG 123 (H) 03/17/2014   MPG 117 (H) 03/02/2013   No results found for: PROLACTIN Lab Results  Component Value Date   CHOL 244 (H) 11/15/2018   TRIG 124 11/15/2018   HDL 51 11/15/2018   CHOLHDL 4.8 (H) 11/15/2018   VLDL 30 03/17/2014   LDLCALC 171 (H) 11/15/2018    LDLCALC 183 (H) 09/23/2017   Lab Results  Component Value Date   TSH 1.950 07/07/2018   TSH 3.263 07/14/2015    Therapeutic Level Labs: No results found for: LITHIUM No results found for: VALPROATE No components found for:  CBMZ  Current Medications: Current Outpatient Medications  Medication Sig Dispense Refill  . albuterol (PROVENTIL HFA;VENTOLIN HFA) 108 (90 Base) MCG/ACT inhaler Inhale 2 puffs into the lungs every 6 (six) hours as needed for wheezing. 1 Inhaler 2  . diazepam (VALIUM) 5 MG tablet TAKE 1 TABLET BY MOUTH THREE TIMES DAILY AS NEEDED( CAUSES DROWSINESS) 90 tablet 0  . dicyclomine (BENTYL) 10 MG capsule Take 1 capsule (10 mg total) by mouth 3 (three) times daily as needed. For stomach cramps 90 capsule 5  . famotidine (PEPCID) 20 MG tablet Take one tablet po each day 90 tablet 1  . FLUoxetine (PROZAC) 20 MG tablet Take one tablet po daily 90 tablet 1  . fluticasone (FLONASE) 50 MCG/ACT nasal spray Place 2 sprays into both nostrils daily as needed for allergies.     . hydrOXYzine (ATARAX/VISTARIL) 25 MG tablet Take 1 tablet (25 mg total) by mouth every 6 (six) hours as needed for anxiety. 60 tablet 2  . ketoconazole (NIZORAL) 2 % cream Apply 1 application topically daily. 60 g 5  . mometasone (ELOCON) 0.1 % cream Apply BID prn 15 g 0  . nitroGLYCERIN (NITROSTAT) 0.4 MG SL tablet Place 1 tablet (0.4 mg total) under the tongue every 5 (five) minutes as needed for chest pain. 20 tablet 0  . Olopatadine HCl 0.2 % SOLN 1 drop each eye each evening as needed 1 Bottle 3  . ondansetron (ZOFRAN) 4 MG tablet Take 1 tablet (4 mg total) by mouth every 6 (six) hours as needed for nausea. 20 tablet 0  . OVER THE COUNTER MEDICATION b complex one daily d3 one  daily Probiotics one daily magnessium    . Polyvinyl Alcohol (LUBRICANT DROPS OP) Place 2 drops into both eyes 3 (three) times daily as needed (for dry eyes).     No current facility-administered medications for this visit.      Musculoskeletal: Strength & Muscle Tone: N/A Gait & Station: N/A Patient leans: N/A  Psychiatric Specialty Exam: Review of Systems  Psychiatric/Behavioral: Positive for dysphoric mood and sleep disturbance. Negative for agitation, behavioral problems, confusion, decreased concentration, hallucinations, self-injury and suicidal ideas. The patient is nervous/anxious. The patient is not hyperactive.   All other systems reviewed and are negative.   There were no vitals taken for this visit.There is no height or weight on file to calculate BMI.  General Appearance: NA  Eye Contact:  NA  Speech:  Clear and Coherent  Volume:  Normal  Mood:  Depressed  Affect:  NA  Thought Process:  Coherent  Orientation:  Full (Time, Place, and Person)  Thought Content: Logical   Suicidal Thoughts:  Yes.  without intent/plan  Homicidal Thoughts:  No  Memory:  Immediate;   Good  Judgement:  Good  Insight:  Fair  Psychomotor Activity:  Normal  Concentration:  Concentration: Good and Attention Span: Good  Recall:  Good  Fund of Knowledge: Good  Language: Good  Akathisia:  No  Handed:  Right  AIMS (if indicated): not done  Assets:  Communication Skills Desire for Improvement  ADL's:  Intact  Cognition: WNL  Sleep:  Poor   Screenings: GAD-7     Office Visit from 02/27/2019 in [place] Office Visit from 04/07/2018 in [place] Visit from 03/07/2018 in [place]  Total GAD-7 Score  10  9  16     PHQ2-9     Office Visit from 02/27/2019 in [place] Visit from 04/07/2018 in [place] Visit from 03/07/2018 in [place] Visit from 04/22/2017 in [place] Visit from 01/20/2017 in [place]  PHQ-2 Total Score  3  4  3  6  2   PHQ-9 Total Score  12  14  16  20  18        Assessment and Plan:  CARMA SULLINS is a 79 y.o. year old female  with a history of depression, anxiety,,fibromyalgia, neck pain,arthritis, sleep apnea, irritable bowel syndrome, hyperlipidemia,GERD, asthma,coarse tremors(evaluated by neurology, which is not consistent with essential tremor),s/p left TKA onDec 2019  , who presents for follow up appointment for MDD (major depressive disorder), recurrent episode, mild ([place])  # unspecified anxiety disorder # MDD, mild recurrent without psychotic features She continues to report depressive symptoms, although there has been improvement in anxiety.  Psychosocial stressors includes her adopted son with autism, who lives at the property besides the patient's home.  Will continue fluoxetine at the current dose to target anxiety and depression given she reports  adverse reaction of worsening in GERD when she self uptitrated it. She is not interested in starting medication which can potentially cause weight gain. She will greatly benefit from CBT; the front desk to coordinate follow-up with Ms. Bynum.  Discussed behavioral activation.   # r/o essential tremor She has postural tremors on exams. No known family history. She is advised to discuss with PCP for further evaluation.   # r/o sleep apnea She snores at night, and has daytime fatigue.  Last sleep study in 2016.  Although she will benefit from reevaluation, she declines  this option due to concern of pandemic. Discussed sleep hygiene.   Plan I have reviewed and updated plans as below 1. Continue fluoxetine 20 mg dialy 2> Next appointment: 5/10 at 11:40 for 30 mins, video - Continue diazepam 5 mg daily as needed for anxiety - She sees Ms. Bynum for therapy  Past trials of medication:sertraline (worsening depression),fluoxetine (palpitation at 40 mg daily),lexapro (drowsiness),Paxil ([place]omb[place]), bupropion (dull), venlafaxine (palpitation),duloxetine,Serzone, amitriptyline (weight gain),Trazodone  The patient demonstrates the following risk factors for  suicide: Chronic risk factors for suicide include:psychiatric disorder ofdepressionand previous suicide attempts of carbone monoxide. Acute risk factorsfor suicide include: N/A. Protective factorsfor this patient include: positive social support, responsibility to others (children, family) and hope for the future. Considering these factors, the overall suicide risk at this point appears to below. Patientisappropriate for outpatient follow up.  Norman Clay, MD 04/24/2019, 11:22 AM

## 2019-04-24 ENCOUNTER — Other Ambulatory Visit: Payer: Self-pay

## 2019-04-24 ENCOUNTER — Ambulatory Visit (INDEPENDENT_AMBULATORY_CARE_PROVIDER_SITE_OTHER): Payer: Medicare HMO | Admitting: Psychiatry

## 2019-04-24 ENCOUNTER — Encounter (HOSPITAL_COMMUNITY): Payer: Self-pay | Admitting: Psychiatry

## 2019-04-24 DIAGNOSIS — F330 Major depressive disorder, recurrent, mild: Secondary | ICD-10-CM

## 2019-04-24 DIAGNOSIS — R69 Illness, unspecified: Secondary | ICD-10-CM | POA: Diagnosis not present

## 2019-04-24 NOTE — Patient Instructions (Signed)
1. Continue fluoxetine 20 mg dialy 2 Next appointment: 5/10 at 11:40

## 2019-05-09 ENCOUNTER — Ambulatory Visit (HOSPITAL_COMMUNITY): Payer: Medicare HMO | Admitting: Psychiatry

## 2019-05-16 ENCOUNTER — Ambulatory Visit (INDEPENDENT_AMBULATORY_CARE_PROVIDER_SITE_OTHER): Payer: Medicare HMO | Admitting: Family Medicine

## 2019-05-16 ENCOUNTER — Other Ambulatory Visit: Payer: Self-pay

## 2019-05-16 DIAGNOSIS — B001 Herpesviral vesicular dermatitis: Secondary | ICD-10-CM | POA: Diagnosis not present

## 2019-05-16 DIAGNOSIS — K2100 Gastro-esophageal reflux disease with esophagitis, without bleeding: Secondary | ICD-10-CM

## 2019-05-16 MED ORDER — VALACYCLOVIR HCL 1 G PO TABS: 1000.0000 mg | ORAL_TABLET | Freq: Three times a day (TID) | ORAL | 0 refills | Status: DC

## 2019-05-16 MED ORDER — VALACYCLOVIR HCL 1 G PO TABS: 1000.0000 mg | ORAL_TABLET | Freq: Every day | ORAL | 5 refills | Status: DC

## 2019-05-16 NOTE — Progress Notes (Signed)
   Subjective:    [name], female    DOB: 1940/03/08, 79 y.o.   MRN: [id number]  HPI Patient calls in today with complaints of fever blisters. She states she has always had them but they never completely go away. She said they are on the lip but also on the lip line. She said she has been using a home remedy of tea tree oil that calms is down but doesn't go away.    Pt also having fatigue.  This is an ongoing issue in addition to this patient having some intermittent reflux related symptoms not taking PPI because she is concerned about the possibility of getting dementia  Virtual Visit via Video Note  I connected with Ruth [name] on 05/16/19 at 10:00 AM EDT by a video enabled telemedicine application and verified that I am speaking with the correct person using two identifiers.  Location: Patient: home Provider: office   I discussed the limitations of evaluation and management by telemedicine and the availability of in person appointments. The patient expressed understanding and agreed to proceed.  History of Present Illness:    Observations/Objective:   Assessment and Plan:   Follow Up Instructions:    I discussed the assessment and treatment plan with the patient. The patient was provided an opportunity to ask questions and all were answered. The patient agreed with the plan and demonstrated an understanding of the instructions.   The patient was advised to call back or seek an in-person evaluation if the symptoms worsen or if the condition fails to improve as anticipated.  I provided 20 minutes of non-face-to-face time during this encounter.     Review of Systems     Objective:   Physical Exam  Unable to do exam because of virtual visit      Assessment & Plan:  Chronic fever blister Valtrex for short course to clear it out then 1 daily to keep it away follow-up if ongoing troubles  GERD patient does not want PPI we will go ahead with a  famotidine 20 mg daily

## 2019-05-29 DIAGNOSIS — T8484XA Pain due to internal orthopedic prosthetic devices, implants and grafts, initial encounter: Secondary | ICD-10-CM | POA: Diagnosis not present

## 2019-05-29 DIAGNOSIS — M79672 Pain in left foot: Secondary | ICD-10-CM | POA: Diagnosis not present

## 2019-06-06 DIAGNOSIS — M542 Cervicalgia: Secondary | ICD-10-CM | POA: Diagnosis not present

## 2019-06-06 DIAGNOSIS — M4722 Other spondylosis with radiculopathy, cervical region: Secondary | ICD-10-CM | POA: Diagnosis not present

## 2019-06-06 DIAGNOSIS — Z6835 Body mass index (BMI) 35.0-35.9, adult: Secondary | ICD-10-CM | POA: Insufficient documentation

## 2019-06-07 ENCOUNTER — Telehealth: Payer: Self-pay | Admitting: *Deleted

## 2019-06-07 ENCOUNTER — Other Ambulatory Visit: Payer: Self-pay

## 2019-06-07 ENCOUNTER — Telehealth: Payer: Self-pay | Admitting: Family Medicine

## 2019-06-07 ENCOUNTER — Telehealth (INDEPENDENT_AMBULATORY_CARE_PROVIDER_SITE_OTHER): Payer: PPO | Admitting: Family Medicine

## 2019-06-07 DIAGNOSIS — A084 Viral intestinal infection, unspecified: Secondary | ICD-10-CM | POA: Diagnosis not present

## 2019-06-07 NOTE — Progress Notes (Signed)
   Subjective:    [name], female    DOB: [birth year], 79 y.o.   MRN: [id number]  HPI  Patient calls with nausea and vomiting Friday pm. Patient feels like she had food poisoning. Patient states she had a lot of gagging and heaving and couldn't get it up and the vomit seemed thick-had severe abdominal spasms with explosive diarrhea.  Patient states she have shaking and tremoring  from all the vomiting and has never had that happen before.  Virtual Visit via Video Note  I connected with Ruth [name] on 06/07/19 at  2:00 PM EDT by a video enabled telemedicine application and verified that I am speaking with the correct person using two identifiers.  Location: Patient: home Provider: office   I discussed the limitations of evaluation and management by telemedicine and the availability of in person appointments. The patient expressed understanding and agreed to proceed.  History of Present Illness:    Observations/Objective:   Assessment and Plan:   Follow Up Instructions:    I discussed the assessment and treatment plan with the patient. The patient was provided an opportunity to ask questions and all were answered. The patient agreed with the plan and demonstrated an understanding of the instructions.   The patient was advised to call back or seek an in-person evaluation if the symptoms worsen or if the condition fails to improve as anticipated.  I provided 22 minutes of non-face-to-face time during this encounter.      Review of Systems  Constitutional: Negative for activity change, appetite change and fatigue.  HENT: Negative for congestion and rhinorrhea.   Respiratory: Negative for cough and shortness of breath.   Cardiovascular: Negative for chest pain and leg swelling.  Gastrointestinal: Positive for diarrhea, nausea and vomiting. Negative for abdominal pain.  Endocrine: Negative for polydipsia and polyphagia.  Skin: Negative for color change.   Neurological: Negative for dizziness and weakness.  Psychiatric/Behavioral: Negative for behavioral problems and confusion.       Objective:   Physical Exam   Today's visit was via telephone Physical exam was not possible for this visit      Assessment & Plan:  1. Viral gastroenteritis Viral illness getting better no need for any type of intervention or labs currently  She does have intermittent tremors I recommend a follow-up office visit to help evaluate make sure we are not seeing any type of whether it be benign essential tremor versus onset of Parkinson's

## 2019-06-07 NOTE — Telephone Encounter (Signed)
Ruth [name], Ruth [name] are scheduled for a virtual visit with your provider today.    Just as we do with appointments in the office, we must obtain your consent to participate.  Your consent will be active for this visit and any virtual visit you may have with one of our providers in the next 365 days.    If you have a MyChart account, I can also send a copy of this consent to you electronically.  All virtual visits are billed to your insurance company just like a traditional visit in the office.  As this is a virtual visit, video technology does not allow for your provider to perform a traditional examination.  This may limit your provider's ability to fully assess your condition.  If your provider identifies any concerns that need to be evaluated in person or the need to arrange testing such as labs, EKG, etc, we will make arrangements to do so.    Although advances in technology are sophisticated, we cannot ensure that it will always work on either your end or our end.  If the connection with a video visit is poor, we may have to switch to a telephone visit.  With either a video or telephone visit, we are not always able to ensure that we have a secure connection.   I need to obtain your verbal consent now.   Are you willing to proceed with your visit today?   Ruth [name] has provided verbal consent on 06/07/2019 for a virtual visit (video or telephone).   Mitzie Na, RN 06/07/2019  1:41 PM

## 2019-06-07 NOTE — Telephone Encounter (Signed)
Form awaiting signature at nurses station. Please advise. Thank you

## 2019-06-07 NOTE — Telephone Encounter (Signed)
Form was signed thank you 

## 2019-06-07 NOTE — Telephone Encounter (Signed)
Renewal form for disability parking placard placed in nurse box at nurse station.

## 2019-06-08 NOTE — Telephone Encounter (Signed)
Pt contacted and verbalized understanding. Pt husband will come by and pick up form. Form at front desk.

## 2019-06-09 ENCOUNTER — Encounter (HOSPITAL_COMMUNITY): Payer: Self-pay | Admitting: Psychiatry

## 2019-06-09 ENCOUNTER — Telehealth (INDEPENDENT_AMBULATORY_CARE_PROVIDER_SITE_OTHER): Payer: PPO | Admitting: Psychiatry

## 2019-06-09 ENCOUNTER — Other Ambulatory Visit: Payer: Self-pay

## 2019-06-09 DIAGNOSIS — F330 Major depressive disorder, recurrent, mild: Secondary | ICD-10-CM

## 2019-06-09 NOTE — Progress Notes (Signed)
Virtual Visit via Video Note  I connected with Ruth [name] on 06/09/19 at 9:05 AM by a video enabled telemedicine application and verified that I am speaking with the correct person using two identifiers.   I discussed the limitations of evaluation and management by telemedicine and the availability of in person appointments. The patient expressed understanding and agreed to proceed.  I provided 49 minutes of non-face-to-face time during this encounter.   Alonza Smoker, LCSW     THERAPIST PROGRESS NOTE  Session Time:  Friday 06/09/2019 9:05 AM - 9:54 AM   Participation Level: Active  Behavioral Response: CasualAlertAnxious and Depressed  Type of Therapy: Individual Therapy  Treatment Goals addressed:learn and implement cognitive and behavioral strategies to overcome depression and cope with anxiety  Interventions: CBT and Supportive  Summary: Ruth [name] is a 79 y.o. female who is referred for services by psychiatrist Dr. Modesta Messing due to patient experiencing symptoms of anxiety and depression.  She reports one psychiatric hospitalization due to depression in the late 80s.  She also was hospitalized at [facility] in the 75s for depression.  She reports participating in therapy intermittently since the 70s.  She reports last being seen for therapy in this practice several years ago.  Patient reports current symptoms began after her knee replacement surgery in December 2019.  She reports experiencing terror, tremors, and panic attacks since that time. When these episodes occur, she reports freezing and being fearful of being alone.  She also reports experiencing social withdrawal, loss of interest and pleasure in activities, hopelessness, irritability, tearfulness, worthlessness, muscle tension, sleep difficulty, and excessive worry.  Stressors include concerns about her adopted 25 year old son who actually is the patient's biological grandson and has autism.  Patient last was seen via  virtual visit about 6 months ago. She is experiencing moderate depression including reports experiencing increased symptoms of depression including depressed mood, crying spells, indecisiveness, sleep difficulty, and thoughts/feelings of hopelessness and helplessness.  Stressors include dealing with the impact of the pandemic, adjusting to husband's retirement, and concerns about her adopted son who has autism.  Patient reports she has not been taking antidepressant as prescribed and has not taken it in several weeks as she fears possible side effects.  She expresses frustration she has little to no time for self and reports feeling overwhelmed.   Suicidal/Homicidal: Nowithout intent/plan  Therapist Response:reviewed symptoms, discussed stressors, facilitated expression of thoughts and feelings, validated feelings, provided psychoeducation regarding the use of medication and psychotherapy in treating depression, assisted patient identify/challenge/and replace negative thoughts about taking medication with more helpful thoughts, developed plan with patient to discuss concerns about medication with psychiatrist Dr. Modesta Messing, assisted patient identify and address thoughts that would inhibit implementation of plan, discussed the role of sleep hygiene in coping with depression and emotional health, assisted patient explore ways to create environment more conducive to sleep including moving her recliner from the den to her bedroom as patient sleeps in the recliner instead of bed,.  Plan: Return again in 2 weeks.  Diagnosis: Axis I: MDD, Recurrent, Moderate    Axis II: No diagnosis    Alonza Smoker, LCSW 06/09/2019

## 2019-06-12 ENCOUNTER — Other Ambulatory Visit: Payer: Self-pay | Admitting: Neurosurgery

## 2019-06-12 DIAGNOSIS — M4722 Other spondylosis with radiculopathy, cervical region: Secondary | ICD-10-CM

## 2019-06-21 NOTE — Progress Notes (Addendum)
Virtual Visit via Video Note  I connected with Ruth [name] on 06/26/19 at 11:40 AM EDT by a video enabled telemedicine application and verified that I am speaking with the correct person using two identifiers.   I discussed the limitations of evaluation and management by telemedicine and the availability of in person appointments. The patient expressed understanding and agreed to proceed.     I discussed the assessment and treatment plan with the patient. The patient was provided an opportunity to ask questions and all were answered. The patient agreed with the plan and demonstrated an understanding of the instructions.   The patient was advised to call back or seek an in-person evaluation if the symptoms worsen or if the condition fails to improve as anticipated.  I provided 20 minutes of non-face-to-face time during this encounter.   Norman Clay, MD    [facility] MD/PA/NP OP Progress Note  06/26/2019 12:10 PM Ruth [name]  MRN:  [id number]  Chief Complaint:  Chief Complaint    Depression; Follow-up     HPI:  This is a follow-up appointment for depression.  She states that although she is having a good day today, she has been crying every day with no reason. She discontinued fluoxetine as she was concerned of its potential side effect while she feels less depressed and has less crying spells when she was on fluoxetine.  She states that she found out that it can cause stroke and MI in geriatric population.  She states that her daughter was on phenobarbital for many years under the diagnosis of epilepsy, which they later found out that she did not have that condition. She feels guilty about this. She also reports that her mother is in palliative care. She is concerned that higher dose of fluoxetine caused worsening in GERD. She is amenable to try lower dose after being provided psycho education.  She has insomnia.  She feels depressed.  She has anhedonia.  She has fair appetite.  She  reports passive SI, although she adamantly denies any plan or intent.  She feels anxious.  She reports a brief period of "over talking," although she denies increased goal directed activity or extreme euphoria.    Visit Diagnosis:    ICD-10-CM   1. MDD (major depressive disorder), recurrent episode, moderate ([place])  F33.1     Past Psychiatric History: Please see initial evaluation for full details. I have reviewed the history. No updates at this time.     Past Medical History:  Past Medical History:  Diagnosis Date  . Anxiety   . Arthritis   . Asthma   . Depression   . Eczema   . Fibromyalgia   . GERD (gastroesophageal reflux disease)    EGD/ colon 1/09  . H pylori ulcer 1980-1990   s/p treatment  . Headache   . Hyperlipidemia   . IBS (irritable bowel syndrome)   . Laryngospasm   . Neck pain   . OSA (obstructive sleep apnea)   . PONV (postoperative nausea and vomiting)   . Refusal of blood transfusions as patient is Jehovah's Witness   . Vertigo     Past Surgical History:  Procedure Laterality Date  . ABDOMINAL HYSTERECTOMY  1970  . APPENDECTOMY    . BILATERAL SALPINGOOPHORECTOMY  1990s  . CARDIAC CATHETERIZATION  2002   [facility]) normal coronary arteries   . COLONOSCOPY  01/2007   Dr. Meriel Flavors  . COLONOSCOPY N/A 04/18/2015   Procedure: COLONOSCOPY;  Surgeon: Mechele Dawley  Laural Golden, MD;  [facility];  Service: Endoscopy;  Laterality: N/A;  830 - moved to 8:55 - Ann to notify pt  . COLONOSCOPY WITH ESOPHAGOGASTRODUODENOSCOPY (EGD)  02/16/2012   Procedure: COLONOSCOPY WITH ESOPHAGOGASTRODUODENOSCOPY (EGD);  Surgeon: Daneil Dolin, MD;  Location: [facility];  Service: Endoscopy;  Laterality: N/A;  8:45  . ESOPHAGEAL DILATION N/A 06/07/2014   Procedure: ESOPHAGEAL DILATION;  Surgeon: Rogene Houston, MD;  Location: [facility];  Service: Endoscopy;  Laterality: N/A;  . ESOPHAGOGASTRODUODENOSCOPY  01/2007   Dr Sharlett Iles- 3 cm hiatal hernia, benign esophageal  biopsies, erosive esophagitis, gastritis  . ESOPHAGOGASTRODUODENOSCOPY N/A 06/07/2014   Procedure: ESOPHAGOGASTRODUODENOSCOPY (EGD);  Surgeon: Rogene Houston, MD;  [facility];  Service: Endoscopy;  Laterality: N/A;  1200  . ESOPHAGOGASTRODUODENOSCOPY N/A 08/18/2017   Procedure: ESOPHAGOGASTRODUODENOSCOPY (EGD);  Surgeon: Rogene Houston, MD;  Location[facility];  Service: Endoscopy;  Laterality: N/A;  2:20  . FOOT SURGERY    . INNER EAR SURGERY     Left  . KNEE SURGERY     Multiple  . SHOULDER SURGERY  2011  . TONSILLECTOMY    . TOTAL KNEE ARTHROPLASTY Left 02/07/2018   Procedure: TOTAL KNEE ARTHROPLASTY;  Surgeon: Gaynelle Arabian, MD;  Location: [facility];  Service: Orthopedics;  Laterality: Left;  110min  . TUBAL LIGATION    . TYMPANOSTOMY TUBE PLACEMENT      Family Psychiatric History: Please see initial evaluation for full details. I have reviewed the history. No updates at this time.     Family History:  Family History  Problem Relation Age of Onset  . Diabetes Father   . Heart attack Father        91's  . Lung cancer Father   . Cirrhosis Father 65       etoh cirrhosis  . Eczema Father   . Lung cancer Sister   . Depression Sister   . Alzheimer's disease Sister   . Hypertension Sister   . Diabetes Sister   . Lung cancer Sister   . Paranoid behavior Mother        depression  . Colon polyps Mother   . Eczema Mother   . Depression Mother   . COPD Daughter   . Depression Maternal Aunt   . Drug abuse Niece   . Suicidality Niece   . Allergic rhinitis Neg Hx   . Asthma Neg Hx   . Urticaria Neg Hx     Social History:  Social History   Socioeconomic History  . Marital status: Married    Spouse name: Not on file  . Number of children: 2  . Years of education: Not on file  . Highest education level: Not on file  Occupational History  . Occupation: Retired; Media planner  Tobacco Use  . Smoking status: Never Smoker  . Smokeless tobacco: Never Used   Substance and Sexual Activity  . Alcohol use: Not Currently    Alcohol/week: 0.0 standard drinks  . Drug use: No  . Sexual activity: Not Currently    Partners: Male    Comment: Hysterectomy  Other Topics Concern  . Not on file  Social History Narrative   Married   2 grown children, 1 adopted son-autistic-lives next door   Social Determinants of Radio broadcast assistant Strain:   . Difficulty of Paying Living Expenses:   Food Insecurity:   . Worried About Charity fundraiser in the Last Year:   .  Ran Out of Food in the Last Year:   Transportation Needs:   . Film/video editor (Medical):   Marland Kitchen Lack of Transportation (Non-Medical):   Physical Activity:   . Days of Exercise per Week:   . Minutes of Exercise per Session:   Stress:   . Feeling of Stress :   Social Connections:   . Frequency of Communication with Friends and Family:   . Frequency of Social Gatherings with Friends and Family:   . Attends Religious Services:   . Active Member of Clubs or Organizations:   . Attends Archivist Meetings:   Marland Kitchen Marital Status:     Allergies:  Allergies  Allergen Reactions  . Blood-Group Specific Substance Other (See Comments)    NO BLOOD PRODUCTS-refuses transfusions  . Ciprofloxacin Other (See Comments)    Pt reports extreme fatigue  . Codeine Nausea And Vomiting  . Latex Other (See Comments)    Red and raw area around incision  . Vicodin [Hydrocodone-Acetaminophen] Nausea And Vomiting  . Tramadol Other (See Comments)    Increased heart rate,increased blood pressure, edema    Metabolic Disorder Labs: Lab Results  Component Value Date   HGBA1C 5.9 (H) 11/15/2018   MPG 123 (H) 03/17/2014   MPG 117 (H) 03/02/2013   No results found for: PROLACTIN Lab Results  Component Value Date   CHOL 244 (H) 11/15/2018   TRIG 124 11/15/2018   HDL 51 11/15/2018   CHOLHDL 4.8 (H) 11/15/2018   VLDL 30 03/17/2014   LDLCALC 171 (H) 11/15/2018   LDLCALC 183 (H)  09/23/2017   Lab Results  Component Value Date   TSH 1.950 07/07/2018   TSH 3.263 07/14/2015    Therapeutic Level Labs: No results found for: LITHIUM No results found for: VALPROATE No components found for:  CBMZ  Current Medications: Current Outpatient Medications  Medication Sig Dispense Refill  . albuterol (PROVENTIL HFA;VENTOLIN HFA) 108 (90 Base) MCG/ACT inhaler Inhale 2 puffs into the lungs every 6 (six) hours as needed for wheezing. 1 Inhaler 2  . diazepam (VALIUM) 5 MG tablet TAKE 1 TABLET BY MOUTH THREE TIMES DAILY AS NEEDED( CAUSES DROWSINESS) 90 tablet 0  . dicyclomine (BENTYL) 10 MG capsule Take 1 capsule (10 mg total) by mouth 3 (three) times daily as needed. For stomach cramps 90 capsule 5  . famotidine (PEPCID) 20 MG tablet Take 20 mg by mouth daily.    Marland Kitchen FLUoxetine (PROZAC) 20 MG tablet Take one tablet po daily (Patient not taking: Reported on 06/09/2019) 90 tablet 1  . fluticasone (FLONASE) 50 MCG/ACT nasal spray Place 2 sprays into both nostrils daily as needed for allergies.     . hydrOXYzine (ATARAX/VISTARIL) 25 MG tablet Take 1 tablet (25 mg total) by mouth every 6 (six) hours as needed for anxiety. (Patient not taking: Reported on 06/09/2019) 60 tablet 2  . ketoconazole (NIZORAL) 2 % cream Apply 1 application topically daily. 60 g 5  . nitroGLYCERIN (NITROSTAT) 0.4 MG SL tablet Place 1 tablet (0.4 mg total) under the tongue every 5 (five) minutes as needed for chest pain. 20 tablet 0  . Olopatadine HCl 0.2 % SOLN 1 drop each eye each evening as needed 1 Bottle 3  . ondansetron (ZOFRAN) 4 MG tablet Take 1 tablet (4 mg total) by mouth every 6 (six) hours as needed for nausea. 20 tablet 0  . OVER THE COUNTER MEDICATION b complex one daily d3 one daily Probiotics one daily magnessium    .  Polyvinyl Alcohol (LUBRICANT DROPS OP) Place 2 drops into both eyes 3 (three) times daily as needed (for dry eyes).    . valACYclovir (VALTREX) 1000 MG tablet Take 1 tablet (1,000 mg  total) by mouth 3 (three) times daily. (Patient not taking: Reported on 06/09/2019) 15 tablet 0  . valACYclovir (VALTREX) 1000 MG tablet Take 1 tablet (1,000 mg total) by mouth daily. (Patient not taking: Reported on 06/09/2019) 30 tablet 5   No current facility-administered medications for this visit.     Musculoskeletal: Strength & Muscle Tone: N/A Gait & Station: N/A Patient leans: N/A  Psychiatric Specialty Exam: Review of Systems  Psychiatric/Behavioral: Positive for dysphoric mood, sleep disturbance and suicidal ideas. Negative for agitation, behavioral problems, confusion, decreased concentration, hallucinations and self-injury. The patient is nervous/anxious. The patient is not hyperactive.   All other systems reviewed and are negative.   There were no vitals taken for this visit.There is no height or weight on file to calculate BMI.  General Appearance: Fairly Groomed  Eye Contact:  Good  Speech:  Clear and Coherent  Volume:  Normal  Mood:  "today is fine"  Affect:  Appropriate, Congruent, Restricted and Tearful  Thought Process:  Coherent  Orientation:  Full (Time, Place, and Person)  Thought Content: Logical   Suicidal Thoughts:  Yes.  without intent/plan  Homicidal Thoughts:  No  Memory:  Immediate;   Good  Judgement:  Good  Insight:  Fair  Psychomotor Activity:  Normal  Concentration:  Concentration: Good and Attention Span: Good  Recall:  Good  Fund of Knowledge: Good  Language: Good  Akathisia:  No  Handed:  Right  AIMS (if indicated): not done  Assets:  Communication Skills Desire for Improvement  ADL's:  Intact  Cognition: WNL  Sleep:  Poor   Screenings: GAD-7     Office Visit from 02/27/2019 in [facility] Office Visit from 04/07/2018 in [place] Office Visit from 03/07/2018 in [place]  Total GAD-7 Score  10  9  16     PHQ2-9     Office Visit from 02/27/2019 in [place] Visit  from 04/07/2018 in [place] Visit from 03/07/2018 in [place] Visit from 04/22/2017 in [place] Visit from 01/20/2017 in [facility]  PHQ-2 Total Score  3  4  3  6  2   PHQ-9 Total Score  12  14  16  20  18        Assessment and Plan:  Ruth [name] is a 79 y.o. year old female with a history of depression, anxiety,fibromyalgia, neck pain,arthritis, sleep apnea, irritable bowel syndrome, hyperlipidemia,GERD, asthma,coarse tremors(evaluated by neurology, which is not consistent with essential tremor),s/p left TKA onDec 2019 , who presents for follow up appointment for MDD (major depressive disorder), recurrent episode, moderate ([place])  # MDD, moderate, recurrent without psychotic features # Unspecified anxiety disorder There has been worsening in depressive symptoms in the context of non adherence to medication due to fear of its potential side effect.  Psychosocial stressors includes her adopted son with autism, who lives at the property besides the patient's home.  After having discussion at length of its potential risk and benefit from medication, she is amenable to restart fluoxetine from low dose.  Noted that she did have adverse reaction to psychotropics as described below, and she is not interested in starting medication which can potentially cause weight gain.  She will greatly benefit  from CBT; she has an upcoming appointment with Ms. Bynum.   # r/o sleep apnea She snores at night,and has daytime fatigue.Last sleep study in 2016.Although she will benefit from reevaluation,she declines this option due to concern of pandemic. Discussed sleep hygiene.  Plan 1. Start fluoxetine 20 mg daily  2. Next appointment: 6/24 at 11:10 for 30 mins, video -Continue diazepam 5 mgdailyas needed for anxiety (she rarely takes this medication) - She sees Ms. Bynum for therapy - Emergency resources which  includes 911, ED, suicide crisis line [phone]) are discussed.   Past trials of medication:sertraline (worsening depression),fluoxetine (palpitation at 40 mg daily),lexapro (drowsiness),Paxil ([place]), bupropion (dull), venlafaxine (palpitation),duloxetine,Serzone, amitriptyline (weight gain),Trazodone  I have reviewed suicide assessment in detail. No change in the following assessment.   The patient demonstrates the following risk factors for suicide: Chronic risk factors for suicide include:psychiatric disorder ofdepressionand previous suicide attempts of carbone monoxide. Acute risk factorsfor suicide include: N/A. Protective factorsfor this patient include: positive social support, responsibility to others (children, family) and hope for the future. Considering these factors, the overall suicide risk at this point appears to below. Patientisappropriate for outpatient follow up.  Norman Clay, MD 06/26/2019, 12:10 PM

## 2019-06-26 ENCOUNTER — Telehealth (INDEPENDENT_AMBULATORY_CARE_PROVIDER_SITE_OTHER): Payer: PPO | Admitting: Psychiatry

## 2019-06-26 ENCOUNTER — Other Ambulatory Visit: Payer: Self-pay

## 2019-06-26 ENCOUNTER — Encounter (HOSPITAL_COMMUNITY): Payer: Self-pay | Admitting: Psychiatry

## 2019-06-26 DIAGNOSIS — F331 Major depressive disorder, recurrent, moderate: Secondary | ICD-10-CM

## 2019-06-28 ENCOUNTER — Ambulatory Visit (INDEPENDENT_AMBULATORY_CARE_PROVIDER_SITE_OTHER): Payer: PPO | Admitting: Psychiatry

## 2019-06-28 ENCOUNTER — Other Ambulatory Visit: Payer: Self-pay

## 2019-06-28 DIAGNOSIS — F331 Major depressive disorder, recurrent, moderate: Secondary | ICD-10-CM | POA: Diagnosis not present

## 2019-06-28 NOTE — Progress Notes (Signed)
Virtual Visit via Video Note  I connected with Ruth [name] on 06/28/19 at  1:00 PM EDT by a video enabled telemedicine application and verified that I am speaking with the correct person using two identifiers.   I discussed the limitations of evaluation and management by telemedicine and the availability of in person appointments. The patient expressed understanding and agreed to proceed.   I provided 50  minutes of non-face-to-face time during this encounter.   Alonza Smoker, LCSW               THERAPIST PROGRESS NOTE  Session Time:  Wednesday 06/28/2019 1:05 PM -  1:55 PM   Participation Level: Active  Behavioral Response: CasualAlertAnxious and Depressed  Type of Therapy: Individual Therapy  Treatment Goals addressed:learn and implement cognitive and behavioral strategies to overcome depression and cope with anxiety  Interventions: CBT and Supportive  Summary: Ruth [name] is a 79 y.o. female who is referred for services by psychiatrist Dr. Modesta Messing due to patient experiencing symptoms of anxiety and depression.  She reports one psychiatric hospitalization due to depression in the late 80s.  She also was hospitalized at [facility] in the 46s for depression.  She reports participating in therapy intermittently since the 70s.  She reports last being seen for therapy in this practice several years ago.  Patient reports current symptoms began after her knee replacement surgery in December 2019.  She reports experiencing terror, tremors, and panic attacks since that time. When these episodes occur, she reports freezing and being fearful of being alone.  She also reports experiencing social withdrawal, loss of interest and pleasure in activities, hopelessness, irritability, tearfulness, worthlessness, muscle tension, sleep difficulty, and excessive worry.  Stressors include concerns about her adopted 1 year old son who actually is the patient's biological grandson and has autism.  Patient  last was seen via virtual visit about 2 weeks ago.  She continues to experience moderate depression including  depressed mood, crying spells, indecisiveness, sleep difficulty, and thoughts/feelings of hopelessness and helplessness.  She reports increased worry about son in the past week as he went to the beach alone and patient experiences what if thoughts and catastrophizing thoughts anytime he goes to the beach.  She continues to report poor stress regarding son calling her excessively.  She reports difficulty setting and maintaining boundaries with him as she states he has no one else to talk to.  She reports thoughts of death but denies any plan or intent to harm self.  She reports she began taking increased dosage of Prozac yesterday as instructed by psychiatrist Dr. Harrington Challenger.  She still expresses some skepticism about medication.  She reports she has been practicing deep breathing to try to cope with anxiety.  She also reports having an assertive conversation with husband regarding removing her recliner to her bedroom.  Per her report, he has agreed but patient says he cannot do this until he completes some of the projects.  Suicidal/Homicidal: Nowithout intent/plan  Therapist Response:reviewed symptoms, praised and reinforced patient practicing deep breathing, praised and reinforced patient's efforts to express concerns to psychiatrist regarding medication, discussed role of medication compliance, discussed stressors, facilitated expression of thoughts and feelings, validated feelings, began to examine patient's thought patterns, oriented patient to CBT, assisted patient began to identify the connection between thoughts/mood/behavior, assisted patient examine her pattern of worry along with her pattern of interaction with her son, discussed boundary issues, will send patient handout on anxiety and worry in preparation for next session  Plan: Return again in 2 weeks.  Diagnosis: Axis I: MDD, Recurrent,  Moderate    Axis II: No diagnosis    Alonza Smoker, LCSW 06/28/2019

## 2019-07-08 ENCOUNTER — Ambulatory Visit
Admission: RE | Admit: 2019-07-08 | Discharge: 2019-07-08 | Disposition: A | Discharge status: Home / Self Care | Payer: PPO | Source: Ambulatory Visit | Attending: Neurosurgery | Admitting: Neurosurgery

## 2019-07-08 DIAGNOSIS — M5030 Other cervical disc degeneration, unspecified cervical region: Secondary | ICD-10-CM | POA: Diagnosis not present

## 2019-07-08 DIAGNOSIS — M4722 Other spondylosis with radiculopathy, cervical region: Secondary | ICD-10-CM

## 2019-07-12 ENCOUNTER — Other Ambulatory Visit: Payer: Self-pay

## 2019-07-12 ENCOUNTER — Ambulatory Visit (INDEPENDENT_AMBULATORY_CARE_PROVIDER_SITE_OTHER): Payer: PPO | Admitting: Psychiatry

## 2019-07-12 DIAGNOSIS — F331 Major depressive disorder, recurrent, moderate: Secondary | ICD-10-CM | POA: Diagnosis not present

## 2019-07-12 NOTE — Progress Notes (Signed)
Virtual Visit via Video Note  I connected with Ruth [name] on 07/12/19 at 4:15 PM EDTby a video enabled telemedicine application and verified that I am speaking with the correct person using two identifiers.   I discussed the limitations of evaluation and management by telemedicine and the availability of in person appointments. The patient expressed understanding and agreed to proceed.  I provided 35 minutes of non-face-to-face time during this encounter.   Alonza Smoker, LCSW               THERAPIST PROGRESS NOTE  Session Time:  Wednesday 07/12/2019 4:15 PM -  4:50 PM   Participation Level: Active  Behavioral Response: CasualAlertAnxious and Depressed  Type of Therapy: Individual Therapy  Treatment Goals addressed:learn and implement cognitive and behavioral strategies to overcome depression and cope with anxiety  Interventions: CBT and Supportive  Summary: Ruth [name] is a 79 y.o. female who is referred for services by psychiatrist Dr. Modesta Messing due to patient experiencing symptoms of anxiety and depression.  She reports one psychiatric hospitalization due to depression in the late 80s.  She also was hospitalized at [facility] in the 33s for depression.  She reports participating in therapy intermittently since the 70s.  She reports last being seen for therapy in this practice several years ago.  Patient reports current symptoms began after her knee replacement surgery in December 2019.  She reports experiencing terror, tremors, and panic attacks since that time. When these episodes occur, she reports freezing and being fearful of being alone.  She also reports experiencing social withdrawal, loss of interest and pleasure in activities, hopelessness, irritability, tearfulness, worthlessness, muscle tension, sleep difficulty, and excessive worry.  Stressors include concerns about her adopted 26 year old son who actually is the patient's biological grandson and has autism.  Patient last  was seen via virtual visit about 2 weeks ago.  She continues to experience moderate depression including deep sadness, poor motivation, diminished interest in activities, and nervousness along with excessive worry.  She reports improved medication compliance and says that she has been taking the increased dosage of Prozac consistently as instructed by psychiatrist Dr. Modesta Messing.  Patient reports decreased crying spells and getting more restful sleep since taking increased dosage.  She expresses concern that she is not as talkative and that she feels very dull.  She reports decreased stress regarding her son as he arrived home safely from his beach trip.  Since his return home, he has decreased the frequency of his phone calls to patient.    Suicidal/Homicidal: Nowithout intent/plan  Therapist Response:reviewed symptoms, praised and reinforced patient's improved medication compliance, discussed effects, developed treatment plan, obtained patient's permission to initial plan as this was a virtual visit, reviewed relaxation techniques using deep breathing, reviewed rationale for practicing regularly, developed plan with patient to practice 5-10 minutes 2 times per day, also developed plan with patient to continue to review previously sent handout in preparation for next session  Plan: Return again in 2 weeks.  Diagnosis: Axis I: MDD, Recurrent, Moderate    Axis II: No diagnosis    Alonza Smoker, LCSW 07/12/2019

## 2019-07-12 NOTE — Addendum Note (Signed)
Addended by: Maurice Small E on: 07/12/2019 05:10 PM   Modules accepted: Level of Service

## 2019-07-13 ENCOUNTER — Telehealth (HOSPITAL_COMMUNITY): Payer: Self-pay | Admitting: *Deleted

## 2019-07-13 ENCOUNTER — Encounter (HOSPITAL_COMMUNITY): Payer: Self-pay | Admitting: *Deleted

## 2019-07-13 NOTE — Telephone Encounter (Signed)
LMOM informing patient to call office so that we may schedule her 2 more appointments 3 wks apart per provider Peggy. Office number provided on patient's voicemail. Staff will also send a message to patient via her mychart.

## 2019-07-26 ENCOUNTER — Telehealth (HOSPITAL_COMMUNITY): Payer: Self-pay | Admitting: Psychiatry

## 2019-07-26 ENCOUNTER — Ambulatory Visit (HOSPITAL_COMMUNITY): Payer: PPO | Admitting: Psychiatry

## 2019-07-26 ENCOUNTER — Other Ambulatory Visit: Payer: Self-pay

## 2019-07-26 DIAGNOSIS — M1711 Unilateral primary osteoarthritis, right knee: Secondary | ICD-10-CM | POA: Diagnosis not present

## 2019-07-26 NOTE — Telephone Encounter (Signed)
Therapist attempted to contact patient twice via text through [place], no response.  Therapist called patient, left message indicating attempt and requesting patient call office.

## 2019-07-31 ENCOUNTER — Other Ambulatory Visit: Payer: Self-pay

## 2019-07-31 ENCOUNTER — Ambulatory Visit (INDEPENDENT_AMBULATORY_CARE_PROVIDER_SITE_OTHER): Payer: PPO | Admitting: Psychiatry

## 2019-07-31 DIAGNOSIS — F331 Major depressive disorder, recurrent, moderate: Secondary | ICD-10-CM | POA: Diagnosis not present

## 2019-07-31 NOTE — Progress Notes (Addendum)
Virtual Visit via Video Note  I connected with Ruth [name] on 07/31/19 at 10:14 AM EDT  by a video enabled telemedicine application and verified that I am speaking with the correct person using two identifiers.   I discussed the limitations of evaluation and management by telemedicine and the availability of in person appointments. The patient expressed understanding and agreed to proceed.   I provided 45 minutes of non-face-to-face time during this encounter.   Alonza Smoker, LCSW               THERAPIST PROGRESS NOTE   Location:  Patient - Home/ Provider- [place] office  Session Time:  Monday 07/31/2019 10:15 AM - 11:00 AM     Participation Level: Active  Behavioral Response: CasualAlertAnxious and Depressed  Type of Therapy: Individual Therapy  Treatment Goals addressed:learn and implement cognitive and behavioral strategies to overcome depression and cope with anxiety  Interventions: CBT and Supportive  Summary: Ruth [name] is a 79 y.o. female who is referred for services by psychiatrist Dr. Modesta Messing due to patient experiencing symptoms of anxiety and depression.  She reports one psychiatric hospitalization due to depression in the late 80s.  She also was hospitalized at [facility] in the 60s for depression.  She reports participating in therapy intermittently since the 70s.  She reports last being seen for therapy in this practice several years ago.  Patient reports current symptoms began after her knee replacement surgery in December 2019.  She reports experiencing terror, tremors, and panic attacks since that time. When these episodes occur, she reports freezing and being fearful of being alone.  She also reports experiencing social withdrawal, loss of interest and pleasure in activities, hopelessness, irritability, tearfulness, worthlessness, muscle tension, sleep difficulty, and excessive worry.  Stressors include concerns about her adopted 61 year old son  who actually is the patient's biological grandson and has autism.  Patient last was seen via virtual visit about 2 weeks ago.  She continues to experience moderate depression including deep sadness, poor motivation, diminished interest in activities and severe anxiety including poor concentration, restlessness, nervousness along with excessive worry.  She reports improved medication compliance and says she increased the dosage of Prozac to 30 mg on her own as she felt more depressed earlier this month.  She will discuss with psychiatrist Dr. Modesta Messing.  She reports no particular triggers of increased depressed mood.  She continues to have negative thoughts about self particular thoughts of being a failure.  She also expresses guilt and self blame for her children's situations.  She reports passive thoughts of death but denies any plan or intent to harm self.  She agrees to call 911 or have someone take her to the ER should symptoms worsen.  Patient reports little to no involvement in activities. Suicidal/Homicidal: Nowithout intent/plan  Therapist Response:reviewed symptoms, administered PHQ-9 and GAD- 7, discussed stressors, facilitated expression of thoughts and feelings, validated feelings, assisted patient link her values with treatment outcomes, assisted patient examine her thought patterns and the effects on her mood and behavior, used cognitive defusion to assist patient cope with depressive thinking, develop plan with patient to go grocery shopping with husband and to go to out with husband for coffee to promote patient's value of enhancing relationship with husband, assisted patient identify and address thoughts and processes that may inhibit implementation of plan  Plan: Return again in 2 weeks.  Diagnosis: Axis I: MDD, Recurrent, Moderate    Axis II: No diagnosis  Alonza Smoker, LCSW 07/31/2019

## 2019-08-02 DIAGNOSIS — M1711 Unilateral primary osteoarthritis, right knee: Secondary | ICD-10-CM | POA: Diagnosis not present

## 2019-08-07 ENCOUNTER — Other Ambulatory Visit: Payer: Self-pay

## 2019-08-07 ENCOUNTER — Ambulatory Visit (INDEPENDENT_AMBULATORY_CARE_PROVIDER_SITE_OTHER): Payer: PPO | Admitting: Psychiatry

## 2019-08-07 DIAGNOSIS — F331 Major depressive disorder, recurrent, moderate: Secondary | ICD-10-CM | POA: Diagnosis not present

## 2019-08-07 NOTE — Progress Notes (Deleted)
BH MD/PA/NP OP Progress Note  08/07/2019 9:39 AM Ruth [name]  MRN:  [id number]  Chief Complaint:  HPI:  - She self uptitrated fluoxetine to 30 mg Visit Diagnosis: No diagnosis found.  Past Psychiatric History: Please see initial evaluation for full details. I have reviewed the history. No updates at this time.     Past Medical History:  Past Medical History:  Diagnosis Date  . Anxiety   . Arthritis   . Asthma   . Depression   . Eczema   . Fibromyalgia   . GERD (gastroesophageal reflux disease)    EGD/ colon 1/09  . H pylori ulcer 1980-1990   s/p treatment  . Headache   . Hyperlipidemia   . IBS (irritable bowel syndrome)   . Laryngospasm   . Neck pain   . OSA (obstructive sleep apnea)   . PONV (postoperative nausea and vomiting)   . Refusal of blood transfusions as patient is Jehovah's Witness   . Vertigo     Past Surgical History:  Procedure Laterality Date  . ABDOMINAL HYSTERECTOMY  1970  . APPENDECTOMY    . BILATERAL SALPINGOOPHORECTOMY  1990s  . CARDIAC CATHETERIZATION  2002   [facility]) normal coronary arteries   . COLONOSCOPY  01/2007   Dr. Meriel Flavors  . COLONOSCOPY N/A 04/18/2015   Procedure: COLONOSCOPY;  Surgeon: Rogene Houston, MD;  Location: [facility];  Service: Endoscopy;  Laterality: N/A;  830 - moved to 8:55 - Ann to notify pt  . COLONOSCOPY WITH ESOPHAGOGASTRODUODENOSCOPY (EGD)  02/16/2012   Procedure: COLONOSCOPY WITH ESOPHAGOGASTRODUODENOSCOPY (EGD);  Surgeon: Daneil Dolin, MD;  Location: [facility];  Service: Endoscopy;  Laterality: N/A;  8:45  . ESOPHAGEAL DILATION N/A 06/07/2014   Procedure: ESOPHAGEAL DILATION;  Surgeon: Rogene Houston, MD;  Location: [facility];  Service: Endoscopy;  Laterality: N/A;  . ESOPHAGOGASTRODUODENOSCOPY  01/2007   Dr Sharlett Iles- 3 cm hiatal hernia, benign esophageal biopsies, erosive esophagitis, gastritis  . ESOPHAGOGASTRODUODENOSCOPY N/A 06/07/2014   Procedure: ESOPHAGOGASTRODUODENOSCOPY (EGD);   Surgeon: Rogene Houston, MD;  [facility]: [facility];  Service: Endoscopy;  Laterality: N/A;  1200  . ESOPHAGOGASTRODUODENOSCOPY N/A 08/18/2017   Procedure: ESOPHAGOGASTRODUODENOSCOPY (EGD);  Surgeon: Rogene Houston, MD;  Location: [facility];  Service: Endoscopy;  Laterality: N/A;  2:20  . FOOT SURGERY    . INNER EAR SURGERY     Left  . KNEE SURGERY     Multiple  . SHOULDER SURGERY  2011  . TONSILLECTOMY    . TOTAL KNEE ARTHROPLASTY Left 02/07/2018   Procedure: TOTAL KNEE ARTHROPLASTY;  Surgeon: Gaynelle Arabian, MD;  Location: [facility];  Service: Orthopedics;  Laterality: Left;  54min  . TUBAL LIGATION    . TYMPANOSTOMY TUBE PLACEMENT      Family Psychiatric History: Please see initial evaluation for full details. I have reviewed the history. No updates at this time.     Family History:  Family History  Problem Relation Age of Onset  . Diabetes Father   . Heart attack Father        56's  . Lung cancer Father   . Cirrhosis Father 57       etoh cirrhosis  . Eczema Father   . Lung cancer Sister   . Depression Sister   . Alzheimer's disease Sister   . Hypertension Sister   . Diabetes Sister   . Lung cancer Sister   . Paranoid behavior Mother  depression  . Colon polyps Mother   . Eczema Mother   . Depression Mother   . COPD Daughter   . Depression Maternal Aunt   . Drug abuse Niece   . Suicidality Niece   . Allergic rhinitis Neg Hx   . Asthma Neg Hx   . Urticaria Neg Hx     Social History:  Social History   Socioeconomic History  . Marital status: Married    Spouse name: Not on file  . Number of children: 2  . Years of education: Not on file  . Highest education level: Not on file  Occupational History  . Occupation: Retired; Media planner  Tobacco Use  . Smoking status: Never Smoker  . Smokeless tobacco: Never Used  Vaping Use  . Vaping Use: Never used  Substance and Sexual Activity  . Alcohol use: Not Currently    Alcohol/week: 0.0 standard  drinks  . Drug use: No  . Sexual activity: Not Currently    Partners: Male    Comment: Hysterectomy  Other Topics Concern  . Not on file  Social History Narrative   Married   2 grown children, 1 adopted son-autistic-lives next door   Social Determinants of Radio broadcast assistant Strain:   . Difficulty of Paying Living Expenses:   Food Insecurity:   . Worried About Charity fundraiser in the Last Year:   . Arboriculturist in the Last Year:   Transportation Needs:   . Film/video editor (Medical):   Marland Kitchen Lack of Transportation (Non-Medical):   Physical Activity:   . Days of Exercise per Week:   . Minutes of Exercise per Session:   Stress:   . Feeling of Stress :   Social Connections:   . Frequency of Communication with Friends and Family:   . Frequency of Social Gatherings with Friends and Family:   . Attends Religious Services:   . Active Member of Clubs or Organizations:   . Attends Archivist Meetings:   Marland Kitchen Marital Status:     Allergies:  Allergies  Allergen Reactions  . Blood-Group Specific Substance Other (See Comments)    NO BLOOD PRODUCTS-refuses transfusions  . Ciprofloxacin Other (See Comments)    Pt reports extreme fatigue  . Codeine Nausea And Vomiting  . Latex Other (See Comments)    Red and raw area around incision  . Vicodin [Hydrocodone-Acetaminophen] Nausea And Vomiting  . Tramadol Other (See Comments)    Increased heart rate,increased blood pressure, edema    Metabolic Disorder Labs: Lab Results  Component Value Date   HGBA1C 5.9 (H) 11/15/2018   MPG 123 (H) 03/17/2014   MPG 117 (H) 03/02/2013   No results found for: PROLACTIN Lab Results  Component Value Date   CHOL 244 (H) 11/15/2018   TRIG 124 11/15/2018   HDL 51 11/15/2018   CHOLHDL 4.8 (H) 11/15/2018   VLDL 30 03/17/2014   LDLCALC 171 (H) 11/15/2018   LDLCALC 183 (H) 09/23/2017   Lab Results  Component Value Date   TSH 1.950 07/07/2018   TSH 3.263 07/14/2015     Therapeutic Level Labs: No results found for: LITHIUM No results found for: VALPROATE No components found for:  CBMZ  Current Medications: Current Outpatient Medications  Medication Sig Dispense Refill  . albuterol (PROVENTIL HFA;VENTOLIN HFA) 108 (90 Base) MCG/ACT inhaler Inhale 2 puffs into the lungs every 6 (six) hours as needed for wheezing. (Patient not taking: Reported on 07/31/2019) 1 Inhaler  2  . diazepam (VALIUM) 5 MG tablet TAKE 1 TABLET BY MOUTH THREE TIMES DAILY AS NEEDED( CAUSES DROWSINESS) 90 tablet 0  . dicyclomine (BENTYL) 10 MG capsule Take 1 capsule (10 mg total) by mouth 3 (three) times daily as needed. For stomach cramps 90 capsule 5  . famotidine (PEPCID) 20 MG tablet Take 20 mg by mouth daily. (Patient not taking: Reported on 07/31/2019)    . FLUoxetine (PROZAC) 20 MG tablet Take one tablet po daily 90 tablet 1  . fluticasone (FLONASE) 50 MCG/ACT nasal spray Place 2 sprays into both nostrils daily as needed for allergies.  (Patient not taking: Reported on 07/31/2019)    . hydrOXYzine (ATARAX/VISTARIL) 25 MG tablet Take 1 tablet (25 mg total) by mouth every 6 (six) hours as needed for anxiety. (Patient not taking: Reported on 06/09/2019) 60 tablet 2  . ketoconazole (NIZORAL) 2 % cream Apply 1 application topically daily. 60 g 5  . nitroGLYCERIN (NITROSTAT) 0.4 MG SL tablet Place 1 tablet (0.4 mg total) under the tongue every 5 (five) minutes as needed for chest pain. (Patient not taking: Reported on 07/31/2019) 20 tablet 0  . Olopatadine HCl 0.2 % SOLN 1 drop each eye each evening as needed 1 Bottle 3  . ondansetron (ZOFRAN) 4 MG tablet Take 1 tablet (4 mg total) by mouth every 6 (six) hours as needed for nausea. (Patient not taking: Reported on 07/31/2019) 20 tablet 0  . OVER THE COUNTER MEDICATION b complex one daily d3 one daily Probiotics one daily magnessium    . Polyvinyl Alcohol (LUBRICANT DROPS OP) Place 2 drops into both eyes 3 (three) times daily as needed (for  dry eyes).    . valACYclovir (VALTREX) 1000 MG tablet Take 1 tablet (1,000 mg total) by mouth 3 (three) times daily. (Patient not taking: Reported on 06/09/2019) 15 tablet 0  . valACYclovir (VALTREX) 1000 MG tablet Take 1 tablet (1,000 mg total) by mouth daily. (Patient not taking: Reported on 06/09/2019) 30 tablet 5   No current facility-administered medications for this visit.     Musculoskeletal: Strength & Muscle Tone: N/A Gait & Station: N/A Patient leans: N/A  Psychiatric Specialty Exam: Review of Systems  There were no vitals taken for this visit.There is no height or weight on file to calculate BMI.  General Appearance: {Appearance:22683}  Eye Contact:  {BHH EYE CONTACT:22684}  Speech:  Clear and Coherent  Volume:  Normal  Mood:  {BHH MOOD:22306}  Affect:  {Affect (PAA):22687}  Thought Process:  Coherent  Orientation:  Full (Time, Place, and Person)  Thought Content: Logical   Suicidal Thoughts:  {ST/HT (PAA):22692}  Homicidal Thoughts:  {ST/HT (PAA):22692}  Memory:  Immediate;   Good  Judgement:  {Judgement (PAA):22694}  Insight:  {Insight (PAA):22695}  Psychomotor Activity:  Normal  Concentration:  Concentration: Good and Attention Span: Good  Recall:  Good  Fund of Knowledge: Good  Language: Good  Akathisia:  No  Handed:  Right  AIMS (if indicated): not done  Assets:  Communication Skills Desire for Improvement  ADL's:  Intact  Cognition: WNL  Sleep:  {BHH GOOD/FAIR/POOR:22877}   Screenings: GAD-7     Counselor from 07/31/2019 in [place] Office Visit from 02/27/2019 in [place] Office Visit from 04/07/2018 in [place] Office Visit from 03/07/2018 in [place]  Total GAD-7 Score 19 10 9 16     PHQ2-9     Counselor from 07/31/2019 in [place] Office  Visit from 02/27/2019 in [place] Visit from  04/07/2018 in [place] Office Visit from 03/07/2018 in [place] Office Visit from 04/22/2017 in [place]  PHQ-2 Total Score 5 3 4 3 6   PHQ-9 Total Score 20 12 14 16 20        Assessment and Plan:  Ruth [name] is a 79 y.o. year old female with a history of depression, anxiety,fibromyalgia, neck pain,arthritis, sleep apnea, irritable bowel syndrome, hyperlipidemia,GERD, asthma,coarse tremors(evaluated by neurology, who presents for follow up appointment for below.     # MDD, moderate, recurrent without psychotic features # Unspecified anxiety disorder There has been worsening in depressive symptoms in the context of non adherence to medication due to fear of its potential side effect.  Psychosocial stressors includes her adopted son with autism, who lives at the property besides the patient's home.  After having discussion at length of its potential risk and benefit from medication, she is amenable to restart fluoxetine from low dose.  Noted that she did have adverse reaction to psychotropics as described below, and she is not interested in starting medication which can potentially cause weight gain.  She will greatly benefit from CBT; she has an upcoming appointment with Ms. Bynum.   # r/o sleep apnea She snores at night,and has daytime fatigue.Last sleep study in 2016.Although she will benefit from reevaluation,she declines this option due to concern of pandemic. Discussed sleep hygiene.  Plan 1. Start fluoxetine 20 mg daily  2. Next appointment: 6/24 at 11:10 for 30 mins, video -Continue diazepam 5 mgdailyas needed for anxiety (she rarely takes this medication) - She sees Ms. Bynum for therapy - Emergency resources which includes 911, ED, suicide crisis line [phone]) are discussed.   Past trials of medication:sertraline (worsening depression),fluoxetine (palpitation at 40 mg daily),lexapro (drowsiness),Paxil  ([place]), bupropion (dull), venlafaxine (palpitation),duloxetine,Serzone, amitriptyline (weight gain),Trazodone  I have reviewed suicide assessment in detail. No change in the following assessment.   The patient demonstrates the following risk factors for suicide: Chronic risk factors for suicide include:psychiatric disorder ofdepressionand previous suicide attempts of carbone monoxide. Acute risk factorsfor suicide include: N/A. Protective factorsfor this patient include: positive social support, responsibility to others (children, family) and hope for the future. Considering these factors, the overall suicide risk at this point appears to below. Patientisappropriate for outpatient follow up.  Norman Clay, MD 08/07/2019, 9:39 AM

## 2019-08-07 NOTE — Progress Notes (Signed)
Virtual Visit via Video Note  I connected with [name] on 08/07/19 at 10:08 AM EDT  by a video enabled telemedicine application and verified that I am speaking with the correct person using two identifiers.   I discussed the limitations of evaluation and management by telemedicine and the availability of in person appointments. The patient expressed understanding and agreed to proceed.   I provided 47 minutes of non-face-to-face time during this encounter.   Alonza Smoker, LCSW               THERAPIST PROGRESS NOTE   Location:  Patient - Home/ Provider- [place] office  Session Time:  Monday 6/212021 10:08 AM - 10:55 AM   Participation Level: Active  Behavioral Response: CasualAlertAnxious and Depressed  Type of Therapy: Individual Therapy  Treatment Goals addressed:learn and implement cognitive and behavioral strategies to overcome depression and cope with anxiety  Interventions: CBT and Supportive  Summary: NIOMI VALENT is a 79 y.o. female who is referred for services by psychiatrist Dr. Modesta Messing due to patient experiencing symptoms of anxiety and depression.  She reports one psychiatric hospitalization due to depression in the late 80s.  She also was hospitalized at [facility] in the 77s for depression.  She reports participating in therapy intermittently since the 70s.  She reports last being seen for therapy in this practice several years ago.  Patient reports current symptoms began after her knee replacement surgery in December 2019.  She reports experiencing terror, tremors, and panic attacks since that time. When these episodes occur, she reports freezing and being fearful of being alone.  She also reports experiencing social withdrawal, loss of interest and pleasure in activities, hopelessness, irritability, tearfulness, worthlessness, muscle tension, sleep difficulty, and excessive worry.  Stressors include concerns about her adopted 64 year old son who  actually is the patient's biological grandson and has autism.  Patient last was seen via virtual visit about one week ago.  She continues to experience depressed mood, anxiety, and excessive worry but reports symptoms have been less intense since last session.  She implemented plan developed in last session.  She reports going out with her husband twice for coffee and [place].  She reports using cognitive defusion to cope with the worry and depressive thoughts that tried to interfere with activity and was able to enjoy the activity.  She also reports going to [employer] and shopping briefly once since last session.  She admits she was anxious but was glad about her accomplishment.  Patient informs therapist this is her last session as she cannot afford to continue services at this time.  She hopes to be able to resume therapy at some point.   Suicidal/Homicidal: Nowithout intent/plan  Therapist Response:reviewed symptoms, praised and reinforced patient's increased behavioral activation and use of cognitive defusion to cope with worry and depressive thoughts, discussed effects, processed patient's thoughts and feelings about terminating therapy, assisted patient develop mental health maintenance plan, will send patient handouts (mental health maintenance plan, weekly schedule), determination, encouraged patient to contact this practice should she need psychotherapy services in the future, encouraged patient to continue medication management with psychiatrist   Plan: Return again in 2 weeks.  Diagnosis: Axis I: MDD, Recurrent, Moderate    Axis II: No diagnosis    Alonza Smoker, LCSW 08/07/2019 Alonza Smoker, LCSW    Outpatient Therapist Discharge Summary  KETURA SIREK    November 20, 1940   Admission Date: 10/04/2018 Discharge Date: 08/07/2019 Reason  for Discharge: Patient has decided to discontinue therapy at this time due to financial constraints. Diagnosis:  Axis I:  MDD (major  depressive disorder), recurrent episode, moderate (HCC)    Comments: Patient is encouraged to contact this practice should she need psychotherapy services in the future.  Patient is encouraged to continue seeing psychiatrist Dr. Modesta Messing for medication management.  Damel Querry E Ulisses Vondrak LCSW

## 2019-08-09 ENCOUNTER — Ambulatory Visit (HOSPITAL_COMMUNITY): Payer: PPO | Attending: Neurosurgery | Admitting: Physical Therapy

## 2019-08-09 ENCOUNTER — Encounter (HOSPITAL_COMMUNITY): Payer: Self-pay | Admitting: Physical Therapy

## 2019-08-09 ENCOUNTER — Other Ambulatory Visit: Payer: Self-pay

## 2019-08-09 DIAGNOSIS — M25612 Stiffness of left shoulder, not elsewhere classified: Secondary | ICD-10-CM | POA: Insufficient documentation

## 2019-08-09 DIAGNOSIS — M25611 Stiffness of right shoulder, not elsewhere classified: Secondary | ICD-10-CM | POA: Insufficient documentation

## 2019-08-09 DIAGNOSIS — M1711 Unilateral primary osteoarthritis, right knee: Secondary | ICD-10-CM | POA: Diagnosis not present

## 2019-08-09 DIAGNOSIS — M542 Cervicalgia: Secondary | ICD-10-CM | POA: Diagnosis not present
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
## 2019-08-10 ENCOUNTER — Telehealth (HOSPITAL_COMMUNITY): Payer: PPO | Admitting: Psychiatry

## 2019-08-15 ENCOUNTER — Ambulatory Visit (HOSPITAL_COMMUNITY): Payer: PPO | Admitting: Physical Therapy

## 2019-08-15 ENCOUNTER — Encounter (HOSPITAL_COMMUNITY): Payer: Self-pay

## 2019-08-15 ENCOUNTER — Ambulatory Visit (HOSPITAL_COMMUNITY): Payer: PPO

## 2019-08-15 DIAGNOSIS — M25612 Stiffness of left shoulder, not elsewhere classified: Secondary | ICD-10-CM

## 2019-08-15 DIAGNOSIS — M542 Cervicalgia: Secondary | ICD-10-CM | POA: Diagnosis not present

## 2019-08-15 DIAGNOSIS — M25611 Stiffness of right shoulder, not elsewhere classified: Secondary | ICD-10-CM

## 2019-08-15 NOTE — Therapy (Signed)
[place], [place], [address] Phone: [phone]   Fax:  [phone]  Physical Therapy Treatment  Patient Details  Name: Ruth [name] MRN: [id number] Date of Birth: 1940/07/08 Referring Provider (PT): Newman Pies, MD   Encounter Date: 08/15/2019   PT End of Session - 08/15/19 1326    Visit Number 2    Number of Visits 12    Date for PT Re-Evaluation 09/20/19    Authorization Type [employer] (No visit limit)    Progress Note Due on Visit 10    PT Start Time 1320    PT Stop Time 1358    PT Time Calculation (min) 38 min    Activity Tolerance Patient tolerated treatment well;Patient limited by pain    Behavior During Therapy [facility] for tasks assessed/performed           Past Medical History:  Diagnosis Date  . Anxiety   . Arthritis   . Asthma   . Depression   . Eczema   . Fibromyalgia   . GERD (gastroesophageal reflux disease)    EGD/ colon 1/09  . H pylori ulcer 1980-1990   s/p treatment  . Headache   . Hyperlipidemia   . IBS (irritable bowel syndrome)   . Laryngospasm   . Neck pain   . OSA (obstructive sleep apnea)   . PONV (postoperative nausea and vomiting)   . Refusal of blood transfusions as patient is Jehovah's Witness   . Vertigo     Past Surgical History:  Procedure Laterality Date  . ABDOMINAL HYSTERECTOMY  1970  . APPENDECTOMY    . BILATERAL SALPINGOOPHORECTOMY  1990s  . CARDIAC CATHETERIZATION  2002   [facility]) normal coronary arteries   . COLONOSCOPY  01/2007   Dr. Meriel Flavors  . COLONOSCOPY N/A 04/18/2015   Procedure: COLONOSCOPY;  Surgeon: Rogene Houston, MD;  [facility];  Service: Endoscopy;  Laterality: N/A;  830 - moved to 8:55 - Ann to notify pt  . COLONOSCOPY WITH ESOPHAGOGASTRODUODENOSCOPY (EGD)  02/16/2012   Procedure: COLONOSCOPY WITH ESOPHAGOGASTRODUODENOSCOPY (EGD);  Surgeon: Daneil Dolin, MD;  [facility];  Service:  Endoscopy;  Laterality: N/A;  8:45  . ESOPHAGEAL DILATION N/A 06/07/2014   Procedure: ESOPHAGEAL DILATION;  Surgeon: Rogene Houston, MD;  [facility];  Service: Endoscopy;  Laterality: N/A;  . ESOPHAGOGASTRODUODENOSCOPY  01/2007   Dr Sharlett Iles- 3 cm hiatal hernia, benign esophageal biopsies, erosive esophagitis, gastritis  . ESOPHAGOGASTRODUODENOSCOPY N/A 06/07/2014   Procedure: ESOPHAGOGASTRODUODENOSCOPY (EGD);  Surgeon: Rogene Houston, MD;  [facility];  Service: Endoscopy;  Laterality: N/A;  1200  . ESOPHAGOGASTRODUODENOSCOPY N/A 08/18/2017   Procedure: ESOPHAGOGASTRODUODENOSCOPY (EGD);  Surgeon: Rogene Houston, MD;  [facility];  Service: Endoscopy;  Laterality: N/A;  2:20  . FOOT SURGERY    . INNER EAR SURGERY     Left  . KNEE SURGERY     Multiple  . SHOULDER SURGERY  2011  . TONSILLECTOMY    . TOTAL KNEE ARTHROPLASTY Left 02/07/2018   Procedure: TOTAL KNEE ARTHROPLASTY;  Surgeon: Gaynelle Arabian, MD;  [facility];  Service: Orthopedics;  Laterality: Left;  66min  . TUBAL LIGATION    . TYMPANOSTOMY TUBE PLACEMENT      There were no vitals filed for this visit.   Subjective Assessment - 08/15/19 1323    Patient Stated Goals Have less pain    Currently in Pain? Yes  Pain Score 5     Pain Location Neck    Pain Orientation Left    Pain Descriptors / Indicators Aching;Sore    Pain Type Chronic pain    Pain Radiating Towards Lt arm superior, down to elbow.  Increase when lifting overhead    Pain Onset More than a month ago    Pain Frequency Constant    Aggravating Factors  Turning her head or first wake up    Pain Relieving Factors Essential Oil                             [facility] Adult PT Treatment/Exercise - 08/15/19 0001      Posture/Postural Control   Posture/Postural Control Postural limitations    Postural Limitations Increased thoracic kyphosis      Exercises   Exercises Neck      Neck Exercises: Seated    Neck Retraction 10 reps;3 secs    Cervical Rotation 10 reps    Cervical Rotation Limitations pain free range    Shoulder Rolls Backwards;10 reps    Shoulder Rolls Limitations up, back and down    Postural Training Posture awareness    Other Seated Exercise scapular retraction 2x 10    Other Seated Exercise 3D cervical excursion      Manual Therapy   Manual Therapy Soft tissue mobilization    Manual therapy comments Manual complete separate than rest of tx    Soft tissue mobilization Seated: Cervical mm, UT and mid trap                  PT Education - 08/15/19 1337    Education Details Reviewed goals, educated importance of HEP compliance that was established this sessoin.  Educated importance of posture for pain control.    Person(s) Educated Patient;Spouse    Methods Explanation;Demonstration;Handout;Verbal cues    Comprehension Verbalized understanding;Returned demonstration            PT Short Term Goals - 08/09/19 1124      PT SHORT TERM GOAL #1   Title Pt will be independent with and consistently perform HEP to improve ROM and reduce pain.     Time 3    Period Weeks    Status New    Target Date 08/30/19      PT SHORT TERM GOAL #2   Title Patient will report an overall improvement in subjective complaint of at least 25% since beginning therapy for improved QoL.    Time 3    Period Weeks    Status New    Target Date 08/30/19             PT Long Term Goals - 08/09/19 1126      PT LONG TERM GOAL #1   Title Patient will report an overall improvement in subjective complaint of at least 50% since beginning therapy for improved QoL.    Time 6    Period Weeks    Status New    Target Date 09/20/19      PT LONG TERM GOAL #2   Title Patient will demonstrate improvement in cervical AROM of at least 5 degrees for improved mobility and ease of observing environment.    Time 6    Period Weeks    Status New    Target Date 09/20/19      PT LONG TERM GOAL  #3   Title Patient will demonstrate improvement on FOTO of  at least 10% indicating improved overall functional mobility and tolerance to daily activities.    Time 6    Period Weeks    Status New    Target Date 09/20/19                 Plan - 08/15/19 1338    Clinical Impression Statement Reviewed goals, educated importance of HEP compliance that was established this session.  Pt educated importance of posture.  Session focus with cervical mobility and posture strengthening.  Min cueing for form wiht therex, improved mechanics wiht reps.  Pt was limited by pain Lt side of neck.  EOS with STM to address restriction in cervical region, pt tender to touch so gentle strokes complete.    Personal Factors and Comorbidities Age;Time since onset of injury/illness/exacerbation;Comorbidity 3+    Comorbidities OA, osteopenia, IBS, Depression, Anxiety, fibromyalgia, History of LT TKA    Examination-Activity Limitations Bathing;Reach Overhead;Dressing;Hygiene/Grooming    Examination-Participation Restrictions Cleaning;Meal Prep;Community Activity;Laundry    Stability/Clinical Decision Making Evolving/Moderate complexity    Clinical Decision Making Moderate    Rehab Potential Fair    PT Frequency 2x / week    PT Duration 6 weeks    PT Treatment/Interventions ADLs/Self Care Home Management;Aquatic Therapy;Canalith Repostioning;Cryotherapy;Electrical Stimulation;Moist Heat;Ultrasound;Traction;DME Instruction;Gait training;Stair training;Functional mobility training;Therapeutic activities;Therapeutic exercise;Balance training;Neuromuscular re-education;Patient/family education;Orthotic Fit/Training;Manual techniques;Passive range of motion;Dry needling;Energy conservation;Taping    PT Next Visit Plan Begin gentle cervical mobility and gentle STM and joint mobilizations as tolerated (if with PT) Patient is very tender to palpation. Progress to upper extremity reaching as able.    PT Home Exercise Plan  08/15/19: posture awareness, posterior shoulder roll and chin tuck.           Patient will benefit from skilled therapeutic intervention in order to improve the following deficits and impairments:  Dizziness, Pain, Improper body mechanics, Decreased mobility, Postural dysfunction, Decreased activity tolerance, Decreased endurance, Decreased range of motion, Decreased strength, Hypomobility, Impaired flexibility  Visit Diagnosis: Cervicalgia  Stiffness of left shoulder, not elsewhere classified  Stiffness of right shoulder, not elsewhere classified     Problem List Patient Active Problem List   Diagnosis Date Noted  . MDD (major depressive disorder), recurrent, in partial remission ([place]) 03/27/2019  . Depression, major, single episode, mild ([place]) 03/07/2018  . OA (osteoarthritis) of knee 02/07/2018  . Other allergic contact dermatitis 12/20/2017  . Non-allergic rhinitis 12/16/2017  . Allergy to metal 12/16/2017  . Gastroesophageal reflux disease without esophagitis 07/15/2017  . Primary osteoarthritis of both feet 04/29/2017  . Family history of early CAD 04/15/2016  . Primary osteoarthritis of both knees 12/31/2015  . Chest pain 07/13/2015  . Atypical chest pain 07/13/2015  . Major depression 03/19/2015  . Vulvodynia 08/03/2014  . Functional constipation 08/03/2014  . Anxiety disorder 07/06/2014  . Fibromyalgia 04/25/2014  . Snoring 04/22/2014  . Prediabetes 03/18/2014  . Hyperlipidemia 03/18/2014  . Osteopenia 03/06/2014  . History of cardiac catheterization 04/28/2013  . Dyspepsia 02/02/2012  . GERD (gastroesophageal reflux disease) 02/02/2012  . Depression 06/18/2011  . Dysphagia 02/05/2011  . CHEST PAIN 02/11/2009  . GASTROESOPHAGEAL REFLUX DISEASE, CHRONIC 02/15/2007  . IRRITABLE BOWEL SYNDROME 02/15/2007  . Primary osteoarthritis of both hands 02/15/2007  . Reflux esophagitis 02/14/2007  . GASTRITIS 02/14/2007   Ihor Austin, LPTA/CLT;  CBIS [phone]  Aldona Lento 08/15/2019, 2:17 PM  [place] [address] [place], [place], [address] Phone: [phone]   Fax:  [phone]  Name: Ruth  RAMIE [name] MRN: [id number] Date of Birth: March 17, 1940

## 2019-08-15 NOTE — Patient Instructions (Addendum)
Seated Horse Stance With Alignment Awareness    Sit in a comfortable position. Bring attention to the present moment, pausing to make subtle adjustments. Body is upright and naturally aligned. Notice breathing...then hands....and feet. Return focus to breathing. Practice for 3 minutes and increase as able.  Copyright  VHI. All rights reserved.   Stretch Break - Shoulder Roll    Roll shoulders forward, up, back, and down to complete a circle 10 times. Reverse direction and repeat. Repeat 1 times every 2-3 hours.  Copyright  VHI. All rights reserved.   Axial Extension (Chin Tuck)    Pull chin in and lengthen back of neck. Hold 3 seconds while counting out loud. Repeat 10 times. Do 2 sessions per day.  http://gt2.exer.us/450   Copyright  VHI. All rights reserved.   SCAPULA: Retraction    Pinch shoulder blades together. Do not shrug shoulders. Hold 3  seconds. 10 reps per set, 2 sets per day, at least 4 days per week  Copyright  VHI. All rights reserved.

## 2019-08-17 ENCOUNTER — Ambulatory Visit (HOSPITAL_COMMUNITY): Payer: PPO | Admitting: Physical Therapy

## 2019-08-17 ENCOUNTER — Telehealth (HOSPITAL_COMMUNITY): Payer: Self-pay | Admitting: Physical Therapy

## 2019-08-17 NOTE — Telephone Encounter (Signed)
pt has cancelled appt for today her husband had a COVID test for a procedure and they both have to isolate until after the procedure is complete

## 2019-08-22 ENCOUNTER — Ambulatory Visit (HOSPITAL_COMMUNITY): Payer: PPO | Admitting: Psychiatry

## 2019-08-22 ENCOUNTER — Other Ambulatory Visit: Payer: Self-pay

## 2019-08-23 ENCOUNTER — Ambulatory Visit (HOSPITAL_COMMUNITY): Payer: PPO | Attending: Neurosurgery | Admitting: Physical Therapy

## 2019-08-23 ENCOUNTER — Encounter (HOSPITAL_COMMUNITY): Payer: Self-pay | Admitting: Physical Therapy

## 2019-08-23 ENCOUNTER — Other Ambulatory Visit: Payer: Self-pay

## 2019-08-23 DIAGNOSIS — M542 Cervicalgia: Secondary | ICD-10-CM | POA: Diagnosis not present

## 2019-08-23 DIAGNOSIS — M25611 Stiffness of right shoulder, not elsewhere classified: Secondary | ICD-10-CM | POA: Diagnosis not present

## 2019-08-23 DIAGNOSIS — M25612 Stiffness of left shoulder, not elsewhere classified: Secondary | ICD-10-CM | POA: Diagnosis not present
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
## 2019-08-30 ENCOUNTER — Ambulatory Visit (HOSPITAL_COMMUNITY): Payer: PPO | Admitting: Physical Therapy

## 2019-08-30 ENCOUNTER — Encounter (HOSPITAL_COMMUNITY): Payer: Self-pay | Admitting: Physical Therapy

## 2019-08-30 ENCOUNTER — Other Ambulatory Visit: Payer: Self-pay

## 2019-08-30 DIAGNOSIS — M25612 Stiffness of left shoulder, not elsewhere classified: Secondary | ICD-10-CM

## 2019-08-30 DIAGNOSIS — M25611 Stiffness of right shoulder, not elsewhere classified: Secondary | ICD-10-CM

## 2019-08-30 DIAGNOSIS — M542 Cervicalgia: Secondary | ICD-10-CM | POA: Diagnosis not present
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
## 2019-09-01 ENCOUNTER — Encounter (HOSPITAL_COMMUNITY): Payer: PPO | Admitting: Physical Therapy

## 2019-09-05 ENCOUNTER — Ambulatory Visit (HOSPITAL_COMMUNITY): Payer: PPO | Admitting: Psychiatry

## 2019-09-05 ENCOUNTER — Other Ambulatory Visit: Payer: Self-pay

## 2019-09-06 ENCOUNTER — Other Ambulatory Visit: Payer: Self-pay

## 2019-09-06 ENCOUNTER — Encounter (HOSPITAL_COMMUNITY): Payer: Self-pay | Admitting: Physical Therapy

## 2019-09-06 ENCOUNTER — Ambulatory Visit (HOSPITAL_COMMUNITY): Payer: PPO | Admitting: Physical Therapy

## 2019-09-06 DIAGNOSIS — M542 Cervicalgia: Secondary | ICD-10-CM | POA: Diagnosis not present

## 2019-09-06 DIAGNOSIS — M25612 Stiffness of left shoulder, not elsewhere classified: Secondary | ICD-10-CM

## 2019-09-06 DIAGNOSIS — M25611 Stiffness of right shoulder, not elsewhere classified: Secondary | ICD-10-CM
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
## 2019-09-06 NOTE — Patient Instructions (Addendum)
CHEST: Doorway, Bilateral - Sitting    Sitting in doorway, place hands on wall with elbows bent at shoulder height. Lean forward. Hold _20__ seconds. _3__ reps per set, _1-2__ sets per day, _7__ days per week  Copyright  [employer]. All rights reserved.

## 2019-09-08 ENCOUNTER — Encounter (HOSPITAL_COMMUNITY): Payer: PPO | Admitting: Physical Therapy

## 2019-09-11 ENCOUNTER — Telehealth (HOSPITAL_COMMUNITY): Payer: Self-pay

## 2019-09-11 NOTE — Telephone Encounter (Signed)
pt cancelled the remainder of her appts because her husband said she can finish doing the therapy at home

## 2019-09-12 ENCOUNTER — Encounter (HOSPITAL_COMMUNITY): Payer: PPO

## 2019-09-13 ENCOUNTER — Telehealth: Payer: Self-pay | Admitting: Family Medicine

## 2019-09-13 NOTE — Telephone Encounter (Signed)
Patient called with some dizziness that has been off and on.  She isn't sure if it is due to some of the meds she is on.  I told her that Dr. Nicki Reaper wasn't in the office and that Dr. Lovena Le was full for the day so she would need to go to urgent care.  She said she is going to hold off for now and see if it gets any worse and if it does she will go to urgent care.  If not then she will call back next week for an appt.

## 2019-09-14 ENCOUNTER — Encounter (HOSPITAL_COMMUNITY): Payer: PPO

## 2019-09-19 ENCOUNTER — Encounter (HOSPITAL_COMMUNITY): Payer: PPO | Admitting: Physical Therapy

## 2019-09-21 ENCOUNTER — Encounter (HOSPITAL_COMMUNITY): Payer: PPO

## 2019-09-26 ENCOUNTER — Encounter (HOSPITAL_COMMUNITY): Payer: Self-pay | Admitting: Physical Therapy

## 2019-09-26 NOTE — Therapy (Signed)
[place], [place], [address] Phone: [phone]   Fax:  [phone]  Patient Details  Name: Ruth [name] MRN: [id number] Date of Birth: 01-15-1941 Referring Provider:  No ref. provider found  Encounter Date: 09/26/2019   PHYSICAL THERAPY DISCHARGE SUMMARY  Visits from Start of Care: 5  Current functional level related to goals / functional outcomes: Unknown as patient did not return for re-assessment.    Remaining deficits: Unknown as patient did not return for re-assessment.    Education / Equipment: HEP Plan: Patient agrees to discharge.  Patient goals were not met. Patient is being discharged due to the patient's request.  ?????      Clarene Critchley PT, DPT 10:18 AM, 09/26/19 [place], [place], [address] Phone: [phone]   Fax:  [phone]

## 2019-10-24 DIAGNOSIS — M2042 Other hammer toe(s) (acquired), left foot: Secondary | ICD-10-CM | POA: Diagnosis not present

## 2019-10-24 DIAGNOSIS — M67874 Other specified disorders of tendon, left ankle and foot: Secondary | ICD-10-CM | POA: Diagnosis not present

## 2019-10-31 DIAGNOSIS — M67874 Other specified disorders of tendon, left ankle and foot: Secondary | ICD-10-CM | POA: Diagnosis not present

## 2019-10-31 DIAGNOSIS — M2042 Other hammer toe(s) (acquired), left foot: Secondary | ICD-10-CM | POA: Diagnosis not present

## 2020-01-25 ENCOUNTER — Encounter: Payer: PPO | Admitting: Family Medicine

## 2020-01-25 ENCOUNTER — Ambulatory Visit (INDEPENDENT_AMBULATORY_CARE_PROVIDER_SITE_OTHER): Payer: PPO | Admitting: Family Medicine

## 2020-01-25 ENCOUNTER — Other Ambulatory Visit: Payer: Self-pay

## 2020-01-25 ENCOUNTER — Encounter: Payer: Self-pay | Admitting: Family Medicine

## 2020-01-25 VITALS — BP 132/84 | HR 102 | Temp 95.5°F | Ht 65.0 in | Wt 217.8 lb

## 2020-01-25 DIAGNOSIS — Z1211 Encounter for screening for malignant neoplasm of colon: Secondary | ICD-10-CM | POA: Diagnosis not present

## 2020-01-25 DIAGNOSIS — Z23 Encounter for immunization: Secondary | ICD-10-CM | POA: Diagnosis not present

## 2020-01-25 DIAGNOSIS — Z0000 Encounter for general adult medical examination without abnormal findings: Secondary | ICD-10-CM | POA: Diagnosis not present

## 2020-01-25 DIAGNOSIS — F411 Generalized anxiety disorder: Secondary | ICD-10-CM | POA: Diagnosis not present

## 2020-01-25 DIAGNOSIS — F320 Major depressive disorder, single episode, mild: Secondary | ICD-10-CM | POA: Diagnosis not present

## 2020-01-25 DIAGNOSIS — M797 Fibromyalgia: Secondary | ICD-10-CM

## 2020-01-25 NOTE — Patient Instructions (Addendum)
I handwritten her instructions regarding colonoscopy we will look into Shingrix second dose

## 2020-01-25 NOTE — Progress Notes (Signed)
Subjective:    [name], female    DOB: Nov 27, 1940, 79 y.o.   MRN: [id number]  HPI AWV- Annual Wellness Visit  The patient was seen for their annual wellness visit. The patient's past medical history, surgical history, and family history were reviewed. Pertinent vaccines were reviewed ( tetanus, pneumonia, shingles, flu) The patient's medication list was reviewed and updated.  The height and weight were entered.  BMI recorded in electronic record elsewhere  Cognitive screening was completed. Outcome of Mini - Cog: pass   Falls /depression screening electronically recorded within record elsewhere Fall Risk  01/25/2020 01/25/2020 05/16/2019 02/27/2019  Falls in the past year? 0 0 1 0  Number falls in past yr: 0 0 0 -  Injury with Fall? 0 0 0 -  Follow up Falls evaluation completed Falls evaluation completed - Falls evaluation completed    Current tobacco usage: none (All patients who use tobacco were given written and verbal information on quitting)  Recent listing of emergency department/hospitalizations over the past year were reviewed.  current specialist the patient sees on a regular basis: Dr.Devanshire; Dr.Estridge    Medicare annual wellness visit patient questionnaire was reviewed.  A written screening schedule for the patient for the next 5-10 years was given. Appropriate discussion of followup regarding next visit was discussed.       Review of Systems  Constitutional: Negative for activity change, appetite change and fatigue.  HENT: Negative for congestion and rhinorrhea.   Respiratory: Negative for cough and shortness of breath.   Cardiovascular: Negative for chest pain and leg swelling.  Gastrointestinal: Negative for abdominal pain and diarrhea.  Endocrine: Negative for polydipsia and polyphagia.  Skin: Negative for color change.  Neurological: Negative for dizziness and weakness.  Psychiatric/Behavioral: Negative for behavioral problems  and confusion.       Objective:   Physical Exam Vitals reviewed.  Constitutional:      General: She is not in acute distress.    Appearance: She is well-nourished.  HENT:     Head: Normocephalic and atraumatic.  Eyes:     General:        Right eye: No discharge.        Left eye: No discharge.  Neck:     Trachea: No tracheal deviation.  Cardiovascular:     Rate and Rhythm: Normal rate and regular rhythm.     Heart sounds: Normal heart sounds. No murmur heard.   Pulmonary:     Effort: Pulmonary effort is normal. No respiratory distress.     Breath sounds: Normal breath sounds.  Musculoskeletal:        General: No edema.  Lymphadenopathy:     Cervical: No cervical adenopathy.  Skin:    General: Skin is warm and dry.  Neurological:     Mental Status: She is alert.     Coordination: Coordination normal.  Psychiatric:        Mood and Affect: Mood and affect normal.        Behavior: Behavior normal.           Assessment & Plan:  1. Need for vaccination Pneumococcal vaccine today - Pneumococcal polysaccharide vaccine 23-valent greater than or equal to 2yo subcutaneous/IM  2. Encounter for screening colonoscopy Colonoscopy will be necessary in March referral was sent - Ambulatory referral to Gastroenterology  3. Generalized anxiety disorder Patient doing the best she can.  She would like to try adjusting her Prozac she will call us she  thinks she is on 10 mg but my chart says 20.  She will call us with what milligram potentially we can do a varying dose such as 2 on 1 day, then 1 the next day and so on  4. Fibromyalgia Fibromyalgia causes her a lot of trouble I recommend gentle activity and stretching narcotics are not a solution she is not asking for these  5. Depression, major, single episode, mild ([facility]) Mild depression Prozac we will adjust based upon the above  6. Encounter for subsequent annual wellness visit (AWV) in Medicare patient Adult  wellness-complete.wellness physical was conducted today. Importance of diet and exercise were discussed in detail.  In addition to this a discussion regarding safety was also covered. We also reviewed over immunizations and gave recommendations regarding current immunization needed for age.  In addition to this additional areas were also touched on including: Preventative health exams needed:  Colonoscopy is due for colonoscopy in March  Patient was advised yearly wellness exam

## 2020-01-30 ENCOUNTER — Telehealth: Payer: Self-pay | Admitting: Family Medicine

## 2020-02-08 DIAGNOSIS — H6121 Impacted cerumen, right ear: Secondary | ICD-10-CM | POA: Diagnosis not present

## 2020-02-08 DIAGNOSIS — H903 Sensorineural hearing loss, bilateral: Secondary | ICD-10-CM | POA: Diagnosis not present

## 2020-02-08 DIAGNOSIS — H7202 Central perforation of tympanic membrane, left ear: Secondary | ICD-10-CM | POA: Diagnosis not present

## 2020-02-20 ENCOUNTER — Ambulatory Visit: Payer: PPO | Admitting: Family Medicine

## 2020-02-21 ENCOUNTER — Ambulatory Visit (INDEPENDENT_AMBULATORY_CARE_PROVIDER_SITE_OTHER): Payer: Medicare HMO | Admitting: Family Medicine

## 2020-02-21 ENCOUNTER — Encounter: Payer: Self-pay | Admitting: Family Medicine

## 2020-02-21 ENCOUNTER — Other Ambulatory Visit: Payer: Self-pay

## 2020-02-21 VITALS — BP 146/72 | HR 97 | Temp 97.2°F | Wt 220.0 lb

## 2020-02-21 DIAGNOSIS — R1032 Left lower quadrant pain: Secondary | ICD-10-CM | POA: Diagnosis not present

## 2020-02-21 DIAGNOSIS — B369 Superficial mycosis, unspecified: Secondary | ICD-10-CM | POA: Diagnosis not present

## 2020-02-21 DIAGNOSIS — R1013 Epigastric pain: Secondary | ICD-10-CM

## 2020-02-21 DIAGNOSIS — R0602 Shortness of breath: Secondary | ICD-10-CM | POA: Diagnosis not present

## 2020-02-21 DIAGNOSIS — R002 Palpitations: Secondary | ICD-10-CM | POA: Diagnosis not present

## 2020-02-21 DIAGNOSIS — R140 Abdominal distension (gaseous): Secondary | ICD-10-CM | POA: Diagnosis not present

## 2020-02-21 DIAGNOSIS — N3000 Acute cystitis without hematuria: Secondary | ICD-10-CM | POA: Diagnosis not present

## 2020-02-21 DIAGNOSIS — R109 Unspecified abdominal pain: Secondary | ICD-10-CM | POA: Diagnosis not present

## 2020-02-21 DIAGNOSIS — T17308S Unspecified foreign body in larynx causing other injury, sequela: Secondary | ICD-10-CM

## 2020-02-21 DIAGNOSIS — T17308A Unspecified foreign body in larynx causing other injury, initial encounter: Secondary | ICD-10-CM | POA: Insufficient documentation

## 2020-02-21 LAB — POCT URINALYSIS DIPSTICK (MANUAL)
ASCORBIC ACID, POC: POSITIVE
Nitrite, UA: NEGATIVE
Poct Bilirubin: NEGATIVE
Poct Blood: NEGATIVE
Poct Glucose: NORMAL mg/dL
Poct Ketones: NEGATIVE
Poct Protein: NEGATIVE mg/dL
Poct Urobilinogen: NORMAL mg/dL
Spec Grav, UA: 1.02 (ref 1.010–1.025)
pH, UA: 5 (ref 5.0–8.0)

## 2020-02-21 MED ORDER — KETOCONAZOLE 2 % EX CREA: 1.0000 "application " | TOPICAL_CREAM | Freq: Every day | CUTANEOUS | 5 refills | Status: DC

## 2020-02-21 MED ORDER — NITROFURANTOIN MONOHYD MACRO 100 MG PO CAPS: 100.0000 mg | ORAL_CAPSULE | Freq: Two times a day (BID) | ORAL | 0 refills | Status: DC

## 2020-02-21 NOTE — Progress Notes (Signed)
[name], female    DOB: [birth year], 80 y.o.   MRN: [id number]   Chief Complaint  Patient presents with  . Constipation    Patient experiencing constipation x 1 week. Using Stool softeners. Patient reports eating very little and feeling extremely bloated and uncomfortable.   Patient has hx of heart palpitations but has been experiencing them more frequently lately.   Bilateral shoulder pain- left has been operated on in the past but patient has decided to go see the orthopedist about this issue.    Subjective:  CC: Shoulder pain, constipation, heart palpitations, abdominal discomfort.  This is a new problem.  Originally was going to present today for right shoulder pain, she has decided to see an orthopedic specialist for this and she will not be addressing this today at this visit.  Presents today with a complaint of constipation for 1 week, this has resolved as of this morning with the use of magnesium powder and stool softeners.  She had a bowel movement this morning.  Complains of heart palpitations and shortness of breath, shortness of breath is with any activity and heart palpitations feel as though she is skipping a beat.  Abdominal discomfort, feels epigastric discomfort associated with eating, describes it as feeling bloated, also feels as though her food is getting stuck in her esophagus, choking on her food, reports that her husband called EMS recently for this problem.  She has seen Dr. Karilyn Cota, gastroenterologist in the past denies fever, chills, chest pain, nausea vomiting diarrhea, or any blood in the stool.  For the right shoulder, she says she has contacted the orthopedic specialist for a possible steroid injection.  PHQ-9 score: 19.  When questioned about this, she became tearful and stated that is just me.  Denies any thoughts of self injury, or suicidal ideations.    Medical History Ruth [name] has a past medical history of Anxiety, Arthritis, Asthma, Depression,  Eczema, Fibromyalgia, GERD (gastroesophageal reflux disease), H pylori ulcer (1980-1990), Headache, Hyperlipidemia, IBS (irritable bowel syndrome), Laryngospasm, Neck pain, OSA (obstructive sleep apnea), PONV (postoperative nausea and vomiting), Refusal of blood transfusions as patient is Jehovah's Witness, and Vertigo.   Outpatient Encounter Medications as of 02/21/2020  Medication Sig  . nitrofurantoin, macrocrystal-monohydrate, (MACROBID) 100 MG capsule Take 1 capsule (100 mg total) by mouth 2 (two) times daily.  Marland Kitchen albuterol (PROVENTIL HFA;VENTOLIN HFA) 108 (90 Base) MCG/ACT inhaler Inhale 2 puffs into the lungs every 6 (six) hours as needed for wheezing.  . diazepam (VALIUM) 5 MG tablet TAKE 1 TABLET BY MOUTH THREE TIMES DAILY AS NEEDED( CAUSES DROWSINESS)  . dicyclomine (BENTYL) 10 MG capsule Take 1 capsule (10 mg total) by mouth 3 (three) times daily as needed. For stomach cramps  . famotidine (PEPCID) 20 MG tablet Take 20 mg by mouth daily.  Marland Kitchen FLUoxetine (PROZAC) 20 MG tablet Take one tablet po daily  . fluticasone (FLONASE) 50 MCG/ACT nasal spray Place 2 sprays into both nostrils daily as needed for allergies.  . hydrOXYzine (ATARAX/VISTARIL) 25 MG tablet Take 1 tablet (25 mg total) by mouth every 6 (six) hours as needed for anxiety.  Marland Kitchen ketoconazole (NIZORAL) 2 % cream Apply 1 application topically daily.  . nitroGLYCERIN (NITROSTAT) 0.4 MG SL tablet Place 1 tablet (0.4 mg total) under the tongue every 5 (five) minutes as needed for chest pain.  Marland Kitchen Olopatadine HCl 0.2 % SOLN 1 drop each eye each evening as needed  . OVER THE COUNTER MEDICATION b complex one  daily d3 one daily Probiotics one daily magnessium  . Polyvinyl Alcohol (LUBRICANT DROPS OP) Place 2 drops into both eyes 3 (three) times daily as needed (for dry eyes).  . [DISCONTINUED] ketoconazole (NIZORAL) 2 % cream Apply 1 application topically daily.  . [DISCONTINUED] ondansetron (ZOFRAN) 4 MG tablet Take 1 tablet (4 mg total) by  mouth every 6 (six) hours as needed for nausea.   No facility-administered encounter medications on file as of 02/21/2020.     Review of Systems  Constitutional: Negative for appetite change, chills and fever.  Respiratory: Positive for cough, choking, chest tightness and shortness of breath. Negative for wheezing.   Cardiovascular: Positive for palpitations. Negative for chest pain and leg swelling.  Gastrointestinal: Positive for abdominal pain and constipation. Negative for blood in stool, diarrhea, nausea, rectal pain and vomiting.  Genitourinary: Positive for flank pain. Negative for dysuria, frequency, hematuria, pelvic pain, urgency, vaginal bleeding and vaginal pain.  Skin: Positive for rash.       Not new  Neurological: Positive for tremors and headaches. Negative for dizziness.       Tremors since knee surgery (better than previously) saw neuro for this in past. Saw psyche too.  Psychiatric/Behavioral: Positive for sleep disturbance. Negative for confusion, decreased concentration, dysphoric mood, hallucinations, self-injury and suicidal ideas. The patient is nervous/anxious. The patient is not hyperactive.        Sleeps 3-5 hours per night.     Vitals BP (!) 146/72   Pulse 97   Temp (!) 97.2 F (36.2 C)   Wt 220 lb (99.8 kg)   SpO2 98%   BMI 36.61 kg/m   Objective:   Physical Exam Vitals reviewed.  Constitutional:      General: She is not in acute distress.    Appearance: Normal appearance. She is not ill-appearing.  Cardiovascular:     Rate and Rhythm: Normal rate and regular rhythm.     Heart sounds: Normal heart sounds.  Pulmonary:     Effort: Pulmonary effort is normal.     Breath sounds: Normal breath sounds.  Abdominal:     General: Bowel sounds are normal.     Tenderness: There is abdominal tenderness in the epigastric area and left lower quadrant. There is right CVA tenderness. There is no left CVA tenderness, guarding or rebound.  Skin:    General:  Skin is warm and dry.  Neurological:     General: No focal deficit present.     Mental Status: She is alert.  Psychiatric:        Behavior: Behavior normal.     Results for orders placed or performed in visit on 02/21/20  POCT Urinalysis Dip Manual  Result Value Ref Range   Spec Grav, UA 1.020 1.010 - 1.025   pH, UA 5.0 5.0 - 8.0   Leukocytes, UA Small (1+) (A) Negative   Nitrite, UA Negative Negative   Poct Protein Negative Negative, trace mg/dL   Poct Glucose Normal Normal mg/dL   Poct Ketones Negative Negative   Poct Urobilinogen Normal Normal mg/dL   Poct Bilirubin Negative Negative   Poct Blood Negative Negative, trace   ASCORBIC ACID, POC Positive     Assessment and Plan   1. Epigastric pain - CBC with Differential - Comprehensive Metabolic Panel (CMET) - Amylase - Lipase - Ambulatory referral to Gastroenterology  2. Left lower quadrant abdominal pain - CBC with Differential - Comprehensive Metabolic Panel (CMET) - Amylase - Lipase  3. Bloating - Ambulatory  referral to Gastroenterology  4. Choking, sequela - Ambulatory referral to Gastroenterology  5. Flank pain - POCT Urinalysis Dip Manual - Urine Culture  6. Heart palpitations - Ambulatory referral to Cardiology  7. SOB (shortness of breath) - Ambulatory referral to Cardiology  8. Fungal infection of skin - ketoconazole (NIZORAL) 2 % cream; Apply 1 application topically daily.  Dispense: 60 g; Refill: 5  9. Acute cystitis without hematuria - nitrofurantoin, macrocrystal-monohydrate, (MACROBID) 100 MG capsule; Take 1 capsule (100 mg total) by mouth 2 (two) times daily.  Dispense: 20 capsule; Refill: 0   Epigastric and left lower quadrant abdominal pain: Will refer to gastroenterology specialist.  She prefers to see Dr. Laural Golden for possible upper endoscopy, and colonoscopy.  Will check CBC, amylase and lipase.  Heart palpitations/shortness of breath: EKG machine unavailable today while patient in  the office, being upgraded.  No active chest pain today, will refer to cardiology.  Will check complete metabolic panel.  Acute cystitis without hematuria: Urine dipstick shows small leukocytes, right CVA tenderness.  Will treat for urinary tract infection.  No fever no chills no rigors.  Fungal skin infection: Routine refill sent per patient request.  Agrees with plan of care discussed today. Understands warning signs to seek further care: Chest pain, shortness of breath, any significant change in health. Understands to follow-up in 3 weeks with Dr. Sallee Lange.  Will notify patient once lab results are available.  Referral process in progress.  She understands that if her symptoms worsen she is to go to the emergency department.    Pecolia Ades, FNP-C

## 2020-02-23 LAB — SPECIMEN STATUS REPORT

## 2020-02-23 LAB — URINE CULTURE: Organism ID, Bacteria: NO GROWTH

## 2020-02-26 ENCOUNTER — Encounter (INDEPENDENT_AMBULATORY_CARE_PROVIDER_SITE_OTHER): Payer: Self-pay | Admitting: *Deleted

## 2020-03-11 ENCOUNTER — Encounter (INDEPENDENT_AMBULATORY_CARE_PROVIDER_SITE_OTHER): Payer: Self-pay

## 2020-03-12 ENCOUNTER — Encounter (INDEPENDENT_AMBULATORY_CARE_PROVIDER_SITE_OTHER): Payer: Self-pay | Admitting: Internal Medicine

## 2020-03-12 ENCOUNTER — Other Ambulatory Visit: Payer: Self-pay

## 2020-03-12 ENCOUNTER — Other Ambulatory Visit (INDEPENDENT_AMBULATORY_CARE_PROVIDER_SITE_OTHER): Payer: Self-pay

## 2020-03-12 ENCOUNTER — Telehealth (INDEPENDENT_AMBULATORY_CARE_PROVIDER_SITE_OTHER): Payer: Medicare HMO | Admitting: Internal Medicine

## 2020-03-12 VITALS — Ht 65.0 in | Wt 217.0 lb

## 2020-03-12 DIAGNOSIS — R1319 Other dysphagia: Secondary | ICD-10-CM

## 2020-03-12 DIAGNOSIS — K5900 Constipation, unspecified: Secondary | ICD-10-CM | POA: Diagnosis not present

## 2020-03-12 DIAGNOSIS — K219 Gastro-esophageal reflux disease without esophagitis: Secondary | ICD-10-CM | POA: Diagnosis not present

## 2020-03-12 MED ORDER — FAMOTIDINE 40 MG PO TABS: 40.0000 mg | ORAL_TABLET | Freq: Every day | ORAL | 5 refills | Status: DC

## 2020-03-12 NOTE — Progress Notes (Signed)
Virtual Visit via Telephone Note  I connected with Ruth [name] on 03/12/20 at  3:30 PM EST by telephone and verified that I am speaking with the correct person using two identifiers.  Location: Patient: home Provider: office   I discussed the limitations, risks, security and privacy concerns of performing an evaluation and management service by telephone and the availability of in person appointments. I also discussed with the patient that there may be a patient responsible charge related to this service. The patient expressed understanding and agreed to proceed.  Presenting complaint  Swallowing difficulty.  History of present illness.  Patient is 80 year old Caucasian female who has chronic GERD and maintained on famotidine.  She has been reluctant to take proton pump inhibitors because of family history of Alzheimer's disease.  She also has history of dysphagia.  She underwent EGD with dilation in July 2019.  She did not have any structural abnormality to her esophagus.  Esophagus was dilated by passing 56 [place] dilator.  Patient did not experience any improvement. Patient states her swallowing difficulty is gradually getting worse.  It is not every day.  She tries to consume soft foods.  She has difficulty with bread and meat and greens as well.  She had an episode of food impaction 3 weeks ago when she was eating a cookie.  She called 911.  By the time EMS arrived boluses spontaneously passed distally.  Her appetite is good and her weight is stable.  She complains of intermittent heartburn and regurgitation.  She states she sleeps in recliner for multiple reasons 1 of which is gastroesophageal reflux disease.  She also complains of constipation.  She has noted some relief with magnesium and OTC laxatives.  She also has history of abdominal cramping and uses dicyclomine once or twice a month.  She does not experience any side effects with dicyclomine.   Current Outpatient  Medications:  .  albuterol (PROVENTIL HFA;VENTOLIN HFA) 108 (90 Base) MCG/ACT inhaler, Inhale 2 puffs into the lungs every 6 (six) hours as needed for wheezing., Disp: 1 Inhaler, Rfl: 2 .  diazepam (VALIUM) 5 MG tablet, TAKE 1 TABLET BY MOUTH THREE TIMES DAILY AS NEEDED( CAUSES DROWSINESS), Disp: 90 tablet, Rfl: 0 Famotidine 20 mg by mouth daily. Marland Kitchen  dicyclomine (BENTYL) 10 MG capsule, Take 1 capsule (10 mg total) by mouth 3 (three) times daily as needed. For stomach cramps, Disp: 90 capsule, Rfl: 5 .  FLUoxetine (PROZAC) 20 MG tablet, Take one tablet po daily, Disp: 90 tablet, Rfl: 1 .  fluticasone (FLONASE) 50 MCG/ACT nasal spray, Place 2 sprays into both nostrils daily as needed for allergies., Disp: , Rfl:  .  hydrOXYzine (ATARAX/VISTARIL) 25 MG tablet, Take 1 tablet (25 mg total) by mouth every 6 (six) hours as needed for anxiety., Disp: 60 tablet, Rfl: 2 .  nitroGLYCERIN (NITROSTAT) 0.4 MG SL tablet, Place 1 tablet (0.4 mg total) under the tongue every 5 (five) minutes as needed for chest pain., Disp: 20 tablet, Rfl: 0 .  OVER THE COUNTER MEDICATION, b complex one daily d3 one daily Probiotics one daily magnessium  Vitamin C, Disp: , Rfl:  .  Polyvinyl Alcohol (LUBRICANT DROPS OP), Place 2 drops into both eyes 3 (three) times daily as needed (for dry eyes)., Disp: , Rfl:  .  ketoconazole (NIZORAL) 2 % cream, Apply 1 application topically daily. (Patient not taking: Reported on 03/12/2020), Disp: 60 g, Rfl: 5    Observations/Objective:  Patient reported her weight to  be 222 pounds  Assessment and Plan:  #1.  Esophageal dysphagia.  I suspect she has motility disorder.  Esophageal dilation in July 2019 did not reveal structural abnormality to her esophagus and she did not improve with esophageal dilation with the [facility] dilator.  She certainly could have developed structure abnormality given chronic GERD.  Therefore it would be reasonable to proceed with barium pill esophagogram before  manometry considered.  #2.  Chronic GERD.  Symptoms are not well controlled with famotidine 20 mg daily.  She is not interested in PPI use for reasons stated above.  She will benefit from higher dose of famotidine.  #3.  History of colonic adenomas.  She has had multiple colonic adenomas.  She is due for colonoscopy March this year.  #4.  Constipation.  Plan  Increase famotidine to 40 mg by mouth daily at bedtime.  New prescription sent to patient's pharmacy for 30 doses with 5 refills. Barium pill esophagogram. Patient can take OTC laxative which is possibly Peri-Colace and schedule rather than as needed basis.  She should take 1 tablet once or twice daily. She would also benefit from fiber supplement but first would like to make sure she does not have esophageal stricture.  Follow Up Instructions:    I discussed the assessment and treatment plan with the patient. The patient was provided an opportunity to ask questions and all were answered. The patient agreed with the plan and demonstrated an understanding of the instructions.   The patient was advised to call back or seek an in-person evaluation if the symptoms worsen or if the condition fails to improve as anticipated.  I provided 18 minutes of non-face-to-face time during this encounter.   Hildred Laser, MD

## 2020-03-12 NOTE — Progress Notes (Signed)
d 

## 2020-03-14 ENCOUNTER — Encounter: Payer: Self-pay | Admitting: Family Medicine

## 2020-03-14 ENCOUNTER — Ambulatory Visit: Payer: Medicare HMO | Admitting: Family Medicine

## 2020-04-02 DIAGNOSIS — M25511 Pain in right shoulder: Secondary | ICD-10-CM | POA: Diagnosis not present

## 2020-04-03 ENCOUNTER — Ambulatory Visit (HOSPITAL_COMMUNITY)
Admission: RE | Admit: 2020-04-03 | Discharge: 2020-04-03 | Disposition: A | Discharge status: Home / Self Care | Payer: Medicare HMO | Source: Ambulatory Visit | Attending: Internal Medicine | Admitting: Internal Medicine

## 2020-04-03 ENCOUNTER — Other Ambulatory Visit: Payer: Self-pay

## 2020-04-03 DIAGNOSIS — R1319 Other dysphagia: Secondary | ICD-10-CM | POA: Diagnosis not present

## 2020-04-03 DIAGNOSIS — R1310 Dysphagia, unspecified: Secondary | ICD-10-CM | POA: Diagnosis not present

## 2020-04-08 ENCOUNTER — Telehealth: Payer: Self-pay | Admitting: Family Medicine

## 2020-04-08 MED ORDER — FLUOXETINE HCL 20 MG PO TABS: ORAL_TABLET | ORAL | 0 refills | Status: DC

## 2020-04-08 NOTE — Telephone Encounter (Signed)
Prescription sent electronically to pharmacy  Left message to return call 

## 2020-04-08 NOTE — Telephone Encounter (Signed)
Patient is requesting refill on fluoxetine 20 mg called into [facility]

## 2020-04-09 NOTE — Telephone Encounter (Signed)
Pt returned call and verbalized understanding  

## 2020-04-10 ENCOUNTER — Other Ambulatory Visit (INDEPENDENT_AMBULATORY_CARE_PROVIDER_SITE_OTHER): Payer: Self-pay | Admitting: *Deleted

## 2020-04-10 DIAGNOSIS — R1319 Other dysphagia: Secondary | ICD-10-CM

## 2020-04-10 DIAGNOSIS — B351 Tinea unguium: Secondary | ICD-10-CM | POA: Diagnosis not present

## 2020-04-10 DIAGNOSIS — M2042 Other hammer toe(s) (acquired), left foot: Secondary | ICD-10-CM | POA: Diagnosis not present

## 2020-04-10 DIAGNOSIS — M67873 Other specified disorders of tendon, right ankle and foot: Secondary | ICD-10-CM | POA: Diagnosis not present

## 2020-04-10 DIAGNOSIS — M67874 Other specified disorders of tendon, left ankle and foot: Secondary | ICD-10-CM | POA: Diagnosis not present

## 2020-04-10 DIAGNOSIS — M2041 Other hammer toe(s) (acquired), right foot: Secondary | ICD-10-CM | POA: Diagnosis not present

## 2020-04-11 ENCOUNTER — Encounter (INDEPENDENT_AMBULATORY_CARE_PROVIDER_SITE_OTHER): Payer: Self-pay | Admitting: *Deleted

## 2020-04-11 ENCOUNTER — Other Ambulatory Visit: Payer: Self-pay

## 2020-04-11 ENCOUNTER — Encounter: Payer: Self-pay | Admitting: Family Medicine

## 2020-04-11 ENCOUNTER — Ambulatory Visit (INDEPENDENT_AMBULATORY_CARE_PROVIDER_SITE_OTHER): Payer: Medicare HMO | Admitting: Family Medicine

## 2020-04-11 VITALS — BP 129/58 | HR 77 | Temp 97.0°F | Wt 216.6 lb

## 2020-04-11 DIAGNOSIS — Z79899 Other long term (current) drug therapy: Secondary | ICD-10-CM

## 2020-04-11 DIAGNOSIS — R7303 Prediabetes: Secondary | ICD-10-CM | POA: Diagnosis not present

## 2020-04-11 DIAGNOSIS — E782 Mixed hyperlipidemia: Secondary | ICD-10-CM

## 2020-04-11 MED ORDER — FLUOXETINE HCL 20 MG PO TABS: ORAL_TABLET | ORAL | 0 refills | Status: DC

## 2020-04-11 MED ORDER — DIAZEPAM 5 MG PO TABS: ORAL_TABLET | ORAL | 0 refills | Status: DC

## 2020-04-11 NOTE — Progress Notes (Signed)
   Subjective:    [name], female    DOB: [birth year], 80 y.o.   MRN: [id number]  HPI Pt here to talk about anxiety and stress. Pt is taking vitamins such at Vit B and essential oils. Pt states she is doing everything she can but "believes she is losing the battle". Pt having internal tremors. Pt wanting to know if there is something to help her calm. Pt did see psychiatry-no change in meds.     Patient is under a lot of stress.  Her son has autism and is on his own but has a lot of anxiety himself and as a result he calls her frequently even during the middle of the night patient does not get much rest she worries about her son's wellbeing  The patient saw her psychiatrist multiple times staying on the current dosing and did not feel it really helped and feels that she does not need to go back.  At times she thinks she would be better off dead but she has no desire to kill herself she does try relaxation techniques  She also mistakenly thought that Prozac increase her risk of heart attacks and strokes.  I talked with her about how this medication would not be likely to do so we also talked about diazepam being used for anxiousness but avoiding frequent use because it can cause unstable this increased risk of falling and fractures Review of Systems  Constitutional: Negative for activity change, fatigue and fever.  HENT: Negative for congestion and rhinorrhea.   Respiratory: Negative for cough, chest tightness and shortness of breath.   Cardiovascular: Negative for chest pain and leg swelling.  Gastrointestinal: Negative for abdominal pain and nausea.  Skin: Negative for color change.  Neurological: Negative for dizziness and headaches.  Psychiatric/Behavioral: Negative for agitation and behavioral problems.       Objective:   Physical Exam  Today's session was spent talking and no exam today patient was coherent moods were in decent control able to express herself  well      Assessment & Plan:  Significant underlying anxiety related issues.  We did discuss going up on Prozac 20 mg take 2 each day follow-up in 4 weeks  Diazepam for sparing use caution drowsiness only when feeling overwhelmed  Continue relaxation techniques  Also encouraged her to get the book boundaries how to say yes how to say no

## 2020-04-15 ENCOUNTER — Telehealth: Payer: Self-pay | Admitting: Family Medicine

## 2020-04-15 NOTE — Telephone Encounter (Signed)
PA for Fluoxetine 20 mg tablet completed and approved until 02/15/21.

## 2020-04-17 DIAGNOSIS — B351 Tinea unguium: Secondary | ICD-10-CM | POA: Diagnosis not present

## 2020-04-18 DIAGNOSIS — M1711 Unilateral primary osteoarthritis, right knee: Secondary | ICD-10-CM | POA: Diagnosis not present

## 2020-04-18 DIAGNOSIS — Z79899 Other long term (current) drug therapy: Secondary | ICD-10-CM | POA: Diagnosis not present

## 2020-04-18 DIAGNOSIS — R7303 Prediabetes: Secondary | ICD-10-CM | POA: Diagnosis not present

## 2020-04-18 DIAGNOSIS — E782 Mixed hyperlipidemia: Secondary | ICD-10-CM | POA: Diagnosis not present

## 2020-04-19 LAB — BASIC METABOLIC PANEL
BUN/Creatinine Ratio: 23 (ref 12–28)
BUN: 19 mg/dL (ref 8–27)
CO2: 22 mmol/L (ref 20–29)
Calcium: 9.1 mg/dL (ref 8.7–10.3)
Chloride: 102 mmol/L (ref 96–106)
Creatinine, Ser: 0.81 mg/dL (ref 0.57–1.00)
Glucose: 94 mg/dL (ref 65–99)
Potassium: 4.4 mmol/L (ref 3.5–5.2)
Sodium: 139 mmol/L (ref 134–144)
eGFR: 74 mL/min/{1.73_m2} (ref >59)

## 2020-04-19 LAB — LIPID PANEL
Chol/HDL Ratio: 4.6 ratio — ABNORMAL HIGH (ref 0.0–4.4)
Cholesterol, Total: 269 mg/dL — ABNORMAL HIGH (ref 100–199)
HDL: 59 mg/dL (ref >39)
LDL Chol Calc (NIH): 198 mg/dL — ABNORMAL HIGH (ref 0–99)
Triglycerides: 74 mg/dL (ref 0–149)
VLDL Cholesterol Cal: 12 mg/dL (ref 5–40)

## 2020-04-19 LAB — HEMOGLOBIN A1C
Est. average glucose Bld gHb Est-mCnc: 131 mg/dL
Hgb A1c MFr Bld: 6.2 % — ABNORMAL HIGH (ref 4.8–5.6)

## 2020-04-24 DIAGNOSIS — M1711 Unilateral primary osteoarthritis, right knee: Secondary | ICD-10-CM | POA: Diagnosis not present

## 2020-04-25 ENCOUNTER — Other Ambulatory Visit (HOSPITAL_COMMUNITY): Payer: Self-pay | Admitting: Specialist

## 2020-04-25 DIAGNOSIS — R1313 Dysphagia, pharyngeal phase: Secondary | ICD-10-CM

## 2020-04-25 DIAGNOSIS — K219 Gastro-esophageal reflux disease without esophagitis: Secondary | ICD-10-CM

## 2020-05-01 DIAGNOSIS — M2041 Other hammer toe(s) (acquired), right foot: Secondary | ICD-10-CM | POA: Diagnosis not present

## 2020-05-01 DIAGNOSIS — M2042 Other hammer toe(s) (acquired), left foot: Secondary | ICD-10-CM | POA: Diagnosis not present

## 2020-05-01 DIAGNOSIS — M67874 Other specified disorders of tendon, left ankle and foot: Secondary | ICD-10-CM | POA: Diagnosis not present

## 2020-05-01 DIAGNOSIS — M67873 Other specified disorders of tendon, right ankle and foot: Secondary | ICD-10-CM | POA: Diagnosis not present

## 2020-05-01 DIAGNOSIS — M1711 Unilateral primary osteoarthritis, right knee: Secondary | ICD-10-CM | POA: Diagnosis not present

## 2020-05-02 DIAGNOSIS — L280 Lichen simplex chronicus: Secondary | ICD-10-CM | POA: Diagnosis not present

## 2020-05-02 DIAGNOSIS — L299 Pruritus, unspecified: Secondary | ICD-10-CM | POA: Diagnosis not present

## 2020-05-02 DIAGNOSIS — L304 Erythema intertrigo: Secondary | ICD-10-CM | POA: Diagnosis not present

## 2020-05-02 DIAGNOSIS — L218 Other seborrheic dermatitis: Secondary | ICD-10-CM | POA: Diagnosis not present

## 2020-05-08 ENCOUNTER — Ambulatory Visit (HOSPITAL_COMMUNITY): Payer: Medicare HMO | Admitting: Speech Pathology

## 2020-05-08 ENCOUNTER — Other Ambulatory Visit (HOSPITAL_COMMUNITY): Payer: Medicare HMO

## 2020-05-10 ENCOUNTER — Other Ambulatory Visit: Payer: Self-pay

## 2020-05-10 ENCOUNTER — Encounter: Payer: Self-pay | Admitting: Family Medicine

## 2020-05-10 ENCOUNTER — Ambulatory Visit (INDEPENDENT_AMBULATORY_CARE_PROVIDER_SITE_OTHER): Payer: Medicare HMO | Admitting: Family Medicine

## 2020-05-10 VITALS — BP 120/64 | HR 79 | Temp 97.8°F | Ht 65.0 in | Wt 217.0 lb

## 2020-05-10 DIAGNOSIS — F411 Generalized anxiety disorder: Secondary | ICD-10-CM | POA: Diagnosis not present

## 2020-05-10 NOTE — Patient Instructions (Signed)
Results for orders placed or performed in visit on 04/11/20  Hemoglobin A1c  Result Value Ref Range   Hgb A1c MFr Bld 6.2 (H) 4.8 - 5.6 %   Est. average glucose Bld gHb Est-mCnc 131 mg/dL  Basic Metabolic Panel (BMET)  Result Value Ref Range   Glucose 94 65 - 99 mg/dL   BUN 19 8 - 27 mg/dL   Creatinine, Ser 0.81 0.57 - 1.00 mg/dL   eGFR 74 >59 mL/min/1.73   BUN/Creatinine Ratio 23 12 - 28   Sodium 139 134 - 144 mmol/L   Potassium 4.4 3.5 - 5.2 mmol/L   Chloride 102 96 - 106 mmol/L   CO2 22 20 - 29 mmol/L   Calcium 9.1 8.7 - 10.3 mg/dL  Lipid Profile  Result Value Ref Range   Cholesterol, Total 269 (H) 100 - 199 mg/dL   Triglycerides 74 0 - 149 mg/dL   HDL 59 >39 mg/dL   VLDL Cholesterol Cal 12 5 - 40 mg/dL   LDL Chol Calc (NIH) 198 (H) 0 - 99 mg/dL   Chol/HDL Ratio 4.6 (H) 0.0 - 4.4 ratio   Your sugar overall was not bad.  A1c is okay at 6.2.  Cholesterol is mildly elevated but in the past your body has not been able to tolerate statins.  I would recommend healthy eating and regular physical activity.  As for the anxiety if it gets worse or you feel like you would like counseling for her to try sertraline please let me know.  Please follow-up in 3 months as we discussed thanks-Dr. Nicki Reaper

## 2020-05-10 NOTE — Progress Notes (Signed)
   Subjective:    [name], female    DOB: 10-12-1940, 80 y.o.   MRN: [id number]  HPIfollow up on anxiety. Pt states she stopped taking her prozac. States it was causing her stomach hurt and anxiety seemed worse.  Patient stopped taking her Prozac felt like it was causing her problems Nail she relates she has ongoing anxiety anxiousness States the Valium helps We did discuss in detail why Valium is not a suitable medication on a day and day out basis because of the risk of underlying issues She seemed to grasp that this could cause addiction issues drowsiness falls etc.  So therefore Valium to be used more infrequently.  I did encourage her to consider starting an antidepressant to help with her anxiety.  Also encouraged counseling to        Objective:   Physical Exam  Lungs clear heart regular HEENT benign      Assessment & Plan:  Very nice patient Recently stop the medicine because of side effects Hesitant about starting new medicines We did talk about sertraline Also talked about counseling Patient will think about all this then she will let us know Otherwise she will follow-up in 3 months Patient not suicidal.

## 2020-06-20 ENCOUNTER — Ambulatory Visit: Payer: Medicare HMO | Admitting: Cardiology

## 2020-06-24 DIAGNOSIS — Z79891 Long term (current) use of opiate analgesic: Secondary | ICD-10-CM | POA: Diagnosis not present

## 2020-06-24 DIAGNOSIS — G4700 Insomnia, unspecified: Secondary | ICD-10-CM | POA: Diagnosis not present

## 2020-06-24 DIAGNOSIS — F331 Major depressive disorder, recurrent, moderate: Secondary | ICD-10-CM | POA: Diagnosis not present

## 2020-06-25 ENCOUNTER — Other Ambulatory Visit: Payer: Self-pay

## 2020-06-25 ENCOUNTER — Ambulatory Visit (INDEPENDENT_AMBULATORY_CARE_PROVIDER_SITE_OTHER): Payer: Medicare HMO | Admitting: Family Medicine

## 2020-06-25 ENCOUNTER — Encounter: Payer: Self-pay | Admitting: Family Medicine

## 2020-06-25 VITALS — BP 129/82 | HR 80 | Temp 97.2°F | Ht 65.0 in | Wt 215.0 lb

## 2020-06-25 DIAGNOSIS — M25511 Pain in right shoulder: Secondary | ICD-10-CM | POA: Diagnosis not present

## 2020-06-25 DIAGNOSIS — M25561 Pain in right knee: Secondary | ICD-10-CM | POA: Diagnosis not present

## 2020-06-25 DIAGNOSIS — M25512 Pain in left shoulder: Secondary | ICD-10-CM

## 2020-06-25 DIAGNOSIS — G8929 Other chronic pain: Secondary | ICD-10-CM | POA: Diagnosis not present

## 2020-06-25 NOTE — Progress Notes (Signed)
   Subjective:    [name], female    DOB: 1940/08/12, 80 y.o.   MRN: [id number]  HPI pt wants to discuss what she can take for pain.  Right knee pain is getting worse. Pain in both shoulders. Pain on left side of head for a while. Not sleeping due to the pain. Last night got 2 hours. Normal is 2 -4 hours of sleep due to pain.   Taking tylenol for pain. It takes the edge off for a little while but not enough to get her through the night.  Patient relates a lot of orthopedic pain and discomfort Patient currently seeing psychiatry for depression   Review of Systems Relates bilateral shoulder pain right knee pain and some hip pain and back pain    Objective:   Physical Exam Lungs clear respiratory rate normal heart regular pulse normal subjective discomfort in the shoulder exam right knee       Assessment & Plan:  We discussed pain management Patient does not tolerate tramadol, codeine, hydrocodone I would recommend Tylenol 500 mg Take 2 in the morning, 1 midday, 2 after supper May also utilize Voltaren gel as needed If ongoing troubles consider Aleve in the evening time or call us for prescription anti-inflammatory As long as this does not bother the stomach that would be reasonable Follow-up by fall time

## 2020-06-26 NOTE — Progress Notes (Signed)
Office Visit Note  Patient: Ruth [name]             Date of Birth: 08-Mar-1940           MRN: [id number]             PCP: Kathyrn Drown, MD Referring: Kathyrn Drown, MD Visit Date: 07/09/2020 Occupation: @GUAROCC @  Subjective:  Pain in multiple joints and muscles   History of Present Illness: Ruth [name] is a 80 y.o. female with a history of fibromyalgia and osteoarthritis.  She states she continues to have some generalized pain and discomfort from fibromyalgia with off-and-on flares.  She continues to have insomnia and fatigue.  She has been having discomfort in her right shoulder joint and she has an appointment with [facility] today.  She had left total knee replacement in the past.  She continues to have discomfort in her right knee joint.  She has been seeing Dr. Maureen Ralphs and has been getting Visco supplement injections.  She has some discomfort in her hands and feet due to underlying osteoarthritis.  She denies any history of joint swelling.  Activities of Daily Living:  Patient reports morning stiffness for 1-2 hours.   Patient Reports nocturnal pain.  Difficulty dressing/grooming: Reports Difficulty climbing stairs: Reports Difficulty getting out of chair: Reports Difficulty using hands for taps, buttons, cutlery, and/or writing: Reports  Review of Systems  Constitutional: Positive for fatigue.  HENT: Positive for mouth sores. Negative for mouth dryness and nose dryness.   Eyes: Positive for itching and dryness. Negative for pain.  Respiratory: Positive for shortness of breath. Negative for difficulty breathing.   Cardiovascular: Positive for palpitations. Negative for chest pain.  Gastrointestinal: Negative for blood in stool, constipation and diarrhea.  Endocrine: Negative for increased urination.  Genitourinary: Negative for difficulty urinating.  Musculoskeletal: Positive for arthralgias, joint pain, joint swelling, myalgias, morning stiffness, muscle  tenderness and myalgias.  Skin: Positive for rash. Negative for color change.  Allergic/Immunologic: Negative for susceptible to infections.  Neurological: Positive for light-headedness, memory loss and weakness. Negative for dizziness, numbness and headaches.  Hematological: Negative for bruising/bleeding tendency.  Psychiatric/Behavioral: Negative for confusion. The patient is nervous/anxious.     PMFS History:  Patient Active Problem List   Diagnosis Date Noted  . Left lower quadrant abdominal pain 02/21/2020  . Fungal infection of skin 02/21/2020  . SOB (shortness of breath) 02/21/2020  . Heart palpitations 02/21/2020  . Flank pain 02/21/2020  . Choking 02/21/2020  . Bloating 02/21/2020  . MDD (major depressive disorder), recurrent, in partial remission ([place]) 03/27/2019  . Depression, major, single episode, mild ([place]) 03/07/2018  . OA (osteoarthritis) of knee 02/07/2018  . Other allergic contact dermatitis 12/20/2017  . Non-allergic rhinitis 12/16/2017  . Allergy to metal 12/16/2017  . Gastroesophageal reflux disease without esophagitis 07/15/2017  . Primary osteoarthritis of both feet 04/29/2017  . Family history of early CAD 04/15/2016  . Primary osteoarthritis of both knees 12/31/2015  . Chest pain 07/13/2015  . Atypical chest pain 07/13/2015  . Major depression 03/19/2015  . Vulvodynia 08/03/2014  . Constipation 08/03/2014  . Anxiety disorder 07/06/2014  . Fibromyalgia 04/25/2014  . Snoring 04/22/2014  . Prediabetes 03/18/2014  . Hyperlipidemia 03/18/2014  . Osteopenia 03/06/2014  . History of cardiac catheterization 04/28/2013  . Epigastric pain 02/02/2012  . GERD (gastroesophageal reflux disease) 02/02/2012  . Depression 06/18/2011  . Esophageal dysphagia 02/05/2011  . CHEST PAIN 02/11/2009  . GASTROESOPHAGEAL REFLUX DISEASE, CHRONIC 02/15/2007  .  IRRITABLE BOWEL SYNDROME 02/15/2007  . Primary osteoarthritis of both hands 02/15/2007  . Reflux esophagitis  02/14/2007  . GASTRITIS 02/14/2007    Past Medical History:  Diagnosis Date  . Anxiety   . Arthritis   . Asthma   . Depression   . Eczema   . Fibromyalgia   . GERD (gastroesophageal reflux disease)    EGD/ colon 1/09  . H pylori ulcer 1980-1990   s/p treatment  . Headache   . Hyperlipidemia   . IBS (irritable bowel syndrome)   . Laryngospasm   . Neck pain   . OSA (obstructive sleep apnea)   . PONV (postoperative nausea and vomiting)   . Refusal of blood transfusions as patient is Jehovah's Witness   . Vertigo     Family History  Problem Relation Age of Onset  . Diabetes Father   . Heart attack Father        41's  . Lung cancer Father   . Cirrhosis Father 70       etoh cirrhosis  . Eczema Father   . Lung cancer Sister   . Depression Sister   . Alzheimer's disease Sister   . Hypertension Sister   . Diabetes Sister   . Lung cancer Sister   . Paranoid behavior Mother        depression  . Colon polyps Mother   . Eczema Mother   . Depression Mother   . COPD Daughter   . Depression Maternal Aunt   . Drug abuse Niece   . Suicidality Niece   . Allergic rhinitis Neg Hx   . Asthma Neg Hx   . Urticaria Neg Hx    Past Surgical History:  Procedure Laterality Date  . ABDOMINAL HYSTERECTOMY  1970  . APPENDECTOMY    . BILATERAL SALPINGOOPHORECTOMY  1990s  . CARDIAC CATHETERIZATION  2002   [facility]) normal coronary arteries   . COLONOSCOPY  01/2007   Dr. Meriel Flavors  . COLONOSCOPY N/A 04/18/2015   Procedure: COLONOSCOPY;  Surgeon: Rogene Houston, MD;  Location: [facility];  Service: Endoscopy;  Laterality: N/A;  830 - moved to 8:55 - Ann to notify pt  . COLONOSCOPY WITH ESOPHAGOGASTRODUODENOSCOPY (EGD)  02/16/2012   Procedure: COLONOSCOPY WITH ESOPHAGOGASTRODUODENOSCOPY (EGD);  Surgeon: Daneil Dolin, MD;  Location: [facility];  Service: Endoscopy;  Laterality: N/A;  8:45  . ESOPHAGEAL DILATION N/A 06/07/2014   Procedure: ESOPHAGEAL DILATION;  Surgeon: Rogene Houston, MD;  Location: [facility];  Service: Endoscopy;  Laterality: N/A;  . ESOPHAGOGASTRODUODENOSCOPY  01/2007   Dr Sharlett Iles- 3 cm hiatal hernia, benign esophageal biopsies, erosive esophagitis, gastritis  . ESOPHAGOGASTRODUODENOSCOPY N/A 06/07/2014   Procedure: ESOPHAGOGASTRODUODENOSCOPY (EGD);  Surgeon: Rogene Houston, MD;  [facility]: [facility];  Service: Endoscopy;  Laterality: N/A;  1200  . ESOPHAGOGASTRODUODENOSCOPY N/A 08/18/2017   Procedure: ESOPHAGOGASTRODUODENOSCOPY (EGD);  Surgeon: Rogene Houston, MD;  Location: [facility];  Service: Endoscopy;  Laterality: N/A;  2:20  . FOOT SURGERY    . INNER EAR SURGERY     Left  . KNEE SURGERY     Multiple  . SHOULDER SURGERY  2011  . TONSILLECTOMY    . TOTAL KNEE ARTHROPLASTY Left 02/07/2018   Procedure: TOTAL KNEE ARTHROPLASTY;  Surgeon: Gaynelle Arabian, MD;  Location: [facility];  Service: Orthopedics;  Laterality: Left;  34min  . TUBAL LIGATION    . TYMPANOSTOMY TUBE PLACEMENT     Social History   Social History  Narrative   Married   2 grown children, 1 adopted son-autistic-lives next [employer] History  Administered Date(s) Administered  . Influenza,inj,Quad PF,6+ Mos 12/15/2017, 11/15/2018  . Influenza-Unspecified 01/16/2015, 11/22/2015, 11/01/2019  . Moderna Sars-Covid-2 Vaccination 03/24/2019, 04/22/2019, 11/18/2019, 06/02/2020  . Pneumococcal Conjugate-13 02/15/2014  . Pneumococcal Polysaccharide-23 01/25/2020  . Zoster Recombinat (Shingrix) 07/10/2016     Objective: Vital Signs: BP (!) 144/79 (BP Location: Left Arm, Patient Position: Sitting, Cuff Size: Normal)   Pulse 77   Resp 16   Ht 5\' 5"  (1.651 m)   Wt 220 lb (99.8 kg)   BMI 36.61 kg/m    Physical Exam Vitals and nursing note reviewed.  Constitutional:      Appearance: She is well-developed.  HENT:     Head: Normocephalic and atraumatic.  Eyes:     Conjunctiva/sclera: Conjunctivae normal.  Cardiovascular:     Rate and Rhythm: Normal  rate and regular rhythm.     Heart sounds: Normal heart sounds.  Pulmonary:     Effort: Pulmonary effort is normal.     Breath sounds: Normal breath sounds.  Abdominal:     General: Bowel sounds are normal.     Palpations: Abdomen is soft.  Musculoskeletal:     Cervical back: Normal range of motion.  Lymphadenopathy:     Cervical: No cervical adenopathy.  Skin:    General: Skin is warm and dry.     Capillary Refill: Capillary refill takes less than 2 seconds.  Neurological:     Mental Status: She is alert and oriented to person, place, and time.  Psychiatric:        Behavior: Behavior normal.      Musculoskeletal Exam: C-spine was in good range of motion.  She had discomfort range of motion of her right shoulder joint.  Shoulder elbow joints, wrist joints, MCPs PIPs and DIPs with good range of motion with no synovitis.  She has bilateral PIP and DIP thickening in her hands and feet.  Her left knee joint is replaced.  She had discomfort range of motion of her right knee joint without any warmth swelling or effusion.  CDAI Exam: CDAI Score: -- Patient Global: --; Provider Global: -- Swollen: --; Tender: -- Joint Exam 07/09/2020   No joint exam has been documented for this visit   There is currently no information documented on the homunculus. Go to the Rheumatology activity and complete the homunculus joint exam.  Investigation: No additional findings.  Imaging: No results found.  Recent Labs: Lab Results  Component Value Date   WBC 10.3 02/09/2018   HGB 11.7 (L) 02/09/2018   PLT 249 02/09/2018   NA 139 04/18/2020   K 4.4 04/18/2020   CL 102 04/18/2020   CO2 22 04/18/2020   GLUCOSE 94 04/18/2020   BUN 19 04/18/2020   CREATININE 0.81 04/18/2020   BILITOT 0.4 02/03/2018   ALKPHOS 64 02/03/2018   AST 18 02/03/2018   ALT 17 02/03/2018   PROT 7.0 02/03/2018   ALBUMIN 4.0 02/03/2018   CALCIUM 9.1 04/18/2020   GFRAA 84 07/07/2018    Speciality Comments: No  specialty comments available.  Procedures:  No procedures performed Allergies: Blood-group specific substance, Ciprofloxacin, Codeine, Latex, Prozac [fluoxetine], Sertraline hcl, Vicodin [hydrocodone-acetaminophen], and Tramadol   Assessment / Plan:     Visit Diagnoses: Primary osteoarthritis of both hands-joint protection muscle strengthening was discussed.  Primary osteoarthritis of right knee-she continues to have right knee joint discomfort.  She has been getting  Visco supplement injections from her orthopedic surgeon.  Weight loss will be helpful.  Status post total knee replacement, left - December 2019 by Dr. Maureen Ralphs   Primary osteoarthritis of both feet-proper fitting shoes were discussed.  Fibromyalgia-she continues to have some generalized pain and discomfort.  Need for water aerobics, swimming and exercise was discussed.  Primary insomnia-good sleep hygiene was discussed.  Other fatigue-related to insomnia and fatigue.  Coarse tremors  Osteopenia of multiple sites-advised her to have repeat DEXA with her PCP.  BMI 36.61-I have given her a handout on weight management clinic.  Weight loss diet and exercise was emphasized.  Orders: No orders of the defined types were placed in this encounter.  No orders of the defined types were placed in this encounter.    Follow-Up Instructions: Return in about 1 year (around 07/09/2021) for Osteoarthritis.   Bo Merino, MD  Note - This record has been created using Editor, commissioning.  Chart creation errors have been sought, but may not always  have been located. Such creation errors do not reflect on  the standard of medical care.

## 2020-07-03 DIAGNOSIS — H7202 Central perforation of tympanic membrane, left ear: Secondary | ICD-10-CM | POA: Diagnosis not present

## 2020-07-03 DIAGNOSIS — H6122 Impacted cerumen, left ear: Secondary | ICD-10-CM | POA: Diagnosis not present

## 2020-07-09 ENCOUNTER — Other Ambulatory Visit: Payer: Self-pay

## 2020-07-09 ENCOUNTER — Ambulatory Visit: Payer: Medicare HMO | Admitting: Rheumatology

## 2020-07-09 ENCOUNTER — Encounter: Payer: Self-pay | Admitting: Rheumatology

## 2020-07-09 VITALS — BP 144/79 | HR 77 | Resp 16 | Ht 65.0 in | Wt 220.0 lb

## 2020-07-09 DIAGNOSIS — F5101 Primary insomnia: Secondary | ICD-10-CM

## 2020-07-09 DIAGNOSIS — M1711 Unilateral primary osteoarthritis, right knee: Secondary | ICD-10-CM | POA: Diagnosis not present

## 2020-07-09 DIAGNOSIS — Z96652 Presence of left artificial knee joint: Secondary | ICD-10-CM | POA: Diagnosis not present

## 2020-07-09 DIAGNOSIS — M25511 Pain in right shoulder: Secondary | ICD-10-CM | POA: Diagnosis not present

## 2020-07-09 DIAGNOSIS — M19042 Primary osteoarthritis, left hand: Secondary | ICD-10-CM

## 2020-07-09 DIAGNOSIS — R5383 Other fatigue: Secondary | ICD-10-CM | POA: Diagnosis not present

## 2020-07-09 DIAGNOSIS — M19071 Primary osteoarthritis, right ankle and foot: Secondary | ICD-10-CM | POA: Diagnosis not present

## 2020-07-09 DIAGNOSIS — M8589 Other specified disorders of bone density and structure, multiple sites: Secondary | ICD-10-CM | POA: Diagnosis not present

## 2020-07-09 DIAGNOSIS — M19041 Primary osteoarthritis, right hand: Secondary | ICD-10-CM | POA: Diagnosis not present

## 2020-07-09 DIAGNOSIS — M797 Fibromyalgia: Secondary | ICD-10-CM | POA: Diagnosis not present

## 2020-07-09 DIAGNOSIS — M19072 Primary osteoarthritis, left ankle and foot: Secondary | ICD-10-CM

## 2020-07-09 DIAGNOSIS — G252 Other specified forms of tremor: Secondary | ICD-10-CM | POA: Diagnosis not present

## 2020-07-17 ENCOUNTER — Encounter (INDEPENDENT_AMBULATORY_CARE_PROVIDER_SITE_OTHER): Payer: Self-pay | Admitting: *Deleted

## 2020-07-30 DIAGNOSIS — F331 Major depressive disorder, recurrent, moderate: Secondary | ICD-10-CM | POA: Diagnosis not present

## 2020-07-30 DIAGNOSIS — G4700 Insomnia, unspecified: Secondary | ICD-10-CM | POA: Diagnosis not present

## 2020-08-01 DIAGNOSIS — M205X2 Other deformities of toe(s) (acquired), left foot: Secondary | ICD-10-CM | POA: Diagnosis not present

## 2020-08-01 DIAGNOSIS — M2041 Other hammer toe(s) (acquired), right foot: Secondary | ICD-10-CM | POA: Diagnosis not present

## 2020-08-01 DIAGNOSIS — M67873 Other specified disorders of tendon, right ankle and foot: Secondary | ICD-10-CM | POA: Diagnosis not present

## 2020-08-01 DIAGNOSIS — M2042 Other hammer toe(s) (acquired), left foot: Secondary | ICD-10-CM | POA: Diagnosis not present

## 2020-08-12 ENCOUNTER — Encounter: Payer: Self-pay | Admitting: Family Medicine

## 2020-08-12 ENCOUNTER — Other Ambulatory Visit: Payer: Self-pay

## 2020-08-12 ENCOUNTER — Ambulatory Visit (INDEPENDENT_AMBULATORY_CARE_PROVIDER_SITE_OTHER): Payer: Medicare HMO | Admitting: Family Medicine

## 2020-08-12 VITALS — BP 130/78 | HR 86 | Temp 97.2°F | Ht 65.0 in | Wt 215.0 lb

## 2020-08-12 DIAGNOSIS — G8929 Other chronic pain: Secondary | ICD-10-CM | POA: Diagnosis not present

## 2020-08-12 DIAGNOSIS — M25511 Pain in right shoulder: Secondary | ICD-10-CM

## 2020-08-12 NOTE — Progress Notes (Signed)
   Subjective:    Patient ID: [name], female    DOB: 08-19-1940, 80 y.o.   MRN: [id number]  HPI  3 month follow up chronic medical conditions  Shoulder pain and discomfort Does try to do exercises Difficult time using the right arm and using a cane because of this I have encouraged her to go back and see orthopedist May need MRI Occasional reflux uses H2 blocker as needed  Patient does have depression being cared for by psychiatry and counseling  R shoulder pain  Review of Systems     Objective:   Physical Exam  General-in no acute distress Eyes-no discharge Lungs-respiratory rate normal, CTA CV-no murmurs,RRR Extremities skin warm dry no edema Neuro grossly normal Behavior normal, alert       Assessment & Plan:  1. Chronic right shoulder pain Shoulder pain and discomfort.  Recommend stretching exercises following through with orthopedics may or may not need MRI  Depression being followed by psychiatry doing well with the medicine currently more than likely they will boost the medicine  Patient will do a follow-up in 4 months

## 2020-08-23 DIAGNOSIS — M25511 Pain in right shoulder: Secondary | ICD-10-CM | POA: Diagnosis not present

## 2020-08-28 ENCOUNTER — Telehealth (INDEPENDENT_AMBULATORY_CARE_PROVIDER_SITE_OTHER): Payer: Self-pay

## 2020-08-28 ENCOUNTER — Encounter (INDEPENDENT_AMBULATORY_CARE_PROVIDER_SITE_OTHER): Payer: Self-pay

## 2020-08-28 ENCOUNTER — Other Ambulatory Visit (INDEPENDENT_AMBULATORY_CARE_PROVIDER_SITE_OTHER): Payer: Self-pay

## 2020-08-28 DIAGNOSIS — R1314 Dysphagia, pharyngoesophageal phase: Secondary | ICD-10-CM

## 2020-08-28 DIAGNOSIS — Z86010 Personal history of colonic polyps: Secondary | ICD-10-CM

## 2020-08-28 MED ORDER — CLENPIQ 10-3.5-12 MG-GM -GM/160ML PO SOLN
1.0000 | Freq: Once | ORAL | 0 refills | Status: AC
Start: 2020-08-28 — End: 2020-08-28

## 2020-08-28 NOTE — Telephone Encounter (Signed)
Referring MD/PCP: Luking  Procedure: Tcs/Egd/Ed  Reason/Indication:  Hx of colon polyps dysphagia  Has patient had this procedure before?  yes  If so, when, by whom and where?  04/2015  Is there a family history of colon cancer?  no  Who?  What age when diagnosed?    Is patient diabetic? If yes, Type 1 or Type 2   no      Does patient have prosthetic heart valve or mechanical valve?  no  Do you have a pacemaker/defibrillator?  no  Has patient ever had endocarditis/atrial fibrillation? no  Does patient use oxygen? no  Has patient had joint replacement within last 12 months?  Yes   Is patient constipated or do they take laxatives? yes  Does patient have a history of alcohol/drug use?  no  Have you had a stroke/heart attack last 6 mths? no  Do you take medicine for weight loss?  no  For female patients,: do you still have your menstrual cycle? no  Is patient on blood thinner such as Coumadin, Plavix and/or Aspirin? no  Medications: none  Allergies: Codeine  Medication Adjustment per Dr Laural Golden none  Procedure date & time: 09/11/20 at 10:55

## 2020-08-28 NOTE — Telephone Encounter (Signed)
Ruth [name], CMA  

## 2020-08-29 ENCOUNTER — Telehealth (INDEPENDENT_AMBULATORY_CARE_PROVIDER_SITE_OTHER): Payer: Self-pay

## 2020-08-29 ENCOUNTER — Encounter (INDEPENDENT_AMBULATORY_CARE_PROVIDER_SITE_OTHER): Payer: Self-pay

## 2020-08-29 ENCOUNTER — Other Ambulatory Visit (INDEPENDENT_AMBULATORY_CARE_PROVIDER_SITE_OTHER): Payer: Self-pay

## 2020-08-29 MED ORDER — SUPREP BOWEL PREP KIT 17.5-3.13-1.6 GM/177ML PO SOLN
1.0000 | ORAL | 0 refills | Status: DC
Start: 2020-08-29 — End: 2020-09-11

## 2020-08-29 NOTE — Telephone Encounter (Signed)
LeighAnn Henrick Mcgue, CMA  

## 2020-09-02 DIAGNOSIS — F331 Major depressive disorder, recurrent, moderate: Secondary | ICD-10-CM | POA: Diagnosis not present

## 2020-09-02 DIAGNOSIS — G4700 Insomnia, unspecified: Secondary | ICD-10-CM | POA: Diagnosis not present

## 2020-09-05 ENCOUNTER — Telehealth (INDEPENDENT_AMBULATORY_CARE_PROVIDER_SITE_OTHER): Payer: Self-pay | Admitting: Internal Medicine

## 2020-09-05 NOTE — Telephone Encounter (Signed)
Patients spouse left voice mail stating patient needs to cancel procedure on 09/11/20 - stated they cant afford to pay $600 up front  ph# [phone]

## 2020-09-06 NOTE — Patient Instructions (Addendum)
Ruth [name]  09/06/2020     '@PREFPERIOPPHARMACY'$ @   Your procedure is scheduled on  09/11/2020.   Report to [facility] at  [place].   Call this number if you have problems the morning of surgery:  [phone]   Remember:  Follow the diet and prep instructions given to you by the office.    Take these medicines the morning of surgery with A SIP OF WATER                    valium (if needed), pepcid, prozac.   Use your inhaler before you come and bring your rescue inhaler with you.    Do not wear jewelry, make-up or nail polish.  Do not wear lotions, powders, or perfumes, or deodorant.  Do not shave 48 hours prior to surgery.  Men may shave face and neck.  Do not bring valuables to the hospital.  [facility] is not responsible for any belongings or valuables.  Contacts, dentures or bridgework may not be worn into surgery.  Leave your suitcase in the car.  After surgery it may be brought to your room.  For patients admitted to the hospital, discharge time will be determined by your treatment team.  Patients discharged the day of surgery will not be allowed to drive home and must gave someone with them for 24 hours.    Special instructions:     DO NOT smoke tobacco or vape for 24 hours before your procedure.  Please read over the following fact sheets that you were given. Anesthesia Post-op Instructions and Care and Recovery After Surgery      Upper Endoscopy, Adult, Care After This sheet gives you information about how to care for yourself after your procedure. Your health care provider may also give you more specific instructions. If you have problems or questions, contact your health careprovider. What can I expect after the procedure? After the procedure, it is common to have: A sore throat. Mild stomach pain or discomfort. Bloating. Nausea. Follow these instructions at home:  Follow instructions from your health care provider about what to eat  or drink after your procedure. Return to your normal activities as told by your health care provider. Ask your health care provider what activities are safe for you. Take over-the-counter and prescription medicines only as told by your health care provider. If you were given a sedative during the procedure, it can affect you for several hours. Do not drive or operate machinery until your health care provider says that it is safe. Keep all follow-up visits as told by your health care provider. This is important. Contact a health care provider if you have: A sore throat that lasts longer than one day. Trouble swallowing. Get help right away if: You vomit blood or your vomit looks like coffee grounds. You have: A fever. Bloody, black, or tarry stools. A severe sore throat or you cannot swallow. Difficulty breathing. Severe pain in your chest or abdomen. Summary After the procedure, it is common to have a sore throat, mild stomach discomfort, bloating, and nausea. If you were given a sedative during the procedure, it can affect you for several hours. Do not drive or operate machinery until your health care provider says that it is safe. Follow instructions from your health care provider about what to eat or drink after your procedure. Return to your normal activities as told by your health care  provider. This information is not intended to replace advice given to you by your health care provider. Make sure you discuss any questions you have with your healthcare provider. Document Revised: 01/31/2019 Document Reviewed: 07/05/2017 Elsevier Patient Education  2022 [place]. https://www.asge.org/home/for-patients/patient-information/understanding-eso-dilation-updated">  Esophageal Dilatation Esophageal dilatation, also called esophageal dilation, is a procedure to widen or open a blocked or narrowed part of the esophagus. The esophagus is the part of the body that moves food and liquid from  the mouth to the stomach. You may need this procedure if: You have a buildup of scar tissue in your esophagus that makes it difficult, painful, or impossible to swallow. This can be caused by gastroesophageal reflux disease (GERD). You have cancer of the esophagus. There is a problem with how food moves through your esophagus. In some cases, you may need this procedure repeated at a later time to dilatethe esophagus gradually. Tell a health care provider about: Any allergies you have. All medicines you are taking, including vitamins, herbs, eye drops, creams, and over-the-counter medicines. Any problems you or family members have had with anesthetic medicines. Any blood disorders you have. Any surgeries you have had. Any medical conditions you have. Any antibiotic medicines you are required to take before dental procedures. Whether you are pregnant or may be pregnant. What are the risks? Generally, this is a safe procedure. However, problems may occur, including: Bleeding due to a tear in the lining of the esophagus. A hole, or perforation, in the esophagus. What happens before the procedure? Ask your health care provider about: Changing or stopping your regular medicines. This is especially important if you are taking diabetes medicines or blood thinners. Taking medicines such as aspirin and ibuprofen. These medicines can thin your blood. Do not take these medicines unless your health care provider tells you to take them. Taking over-the-counter medicines, vitamins, herbs, and supplements. Follow instructions from your health care provider about eating or drinking restrictions. Plan to have a responsible adult take you home from the hospital or clinic. Plan to have a responsible adult care for you for the time you are told after you leave the hospital or clinic. This is important. What happens during the procedure? You may be given a medicine to help you relax (sedative). A numbing  medicine may be sprayed into the back of your throat, or you may gargle the medicine. Your health care provider may perform the dilatation using various surgical instruments, such as: Simple dilators. This instrument is carefully placed in the esophagus to stretch it. Guided wire bougies. This involves using an endoscope to insert a wire into the esophagus. A dilator is passed over this wire to enlarge the esophagus. Then the wire is removed. Balloon dilators. An endoscope with a small balloon is inserted into the esophagus. The balloon is inflated to stretch the esophagus and open it up. The procedure may vary among health care providers and hospitals. What can I expect after the procedure? Your blood pressure, heart rate, breathing rate, and blood oxygen level will be monitored until you leave the hospital or clinic. Your throat may feel slightly sore and numb. This will get better over time. You will not be allowed to eat or drink until your throat is no longer numb. When you are able to drink, urinate, and sit on the edge of the bed without nausea or dizziness, you may be able to return home. Follow these instructions at home: Take over-the-counter and prescription medicines only as told by your  health care provider. If you were given a sedative during the procedure, it can affect you for several hours. Do not drive or operate machinery until your health care provider says that it is safe. Plan to have a responsible adult care for you for the time you are told. This is important. Follow instructions from your health care provider about any eating or drinking restrictions. Do not use any products that contain nicotine or tobacco, such as cigarettes, e-cigarettes, and chewing tobacco. If you need help quitting, ask your health care provider. Keep all follow-up visits. This is important. Contact a health care provider if: You have a fever. You have pain that is not relieved by medicine. Get  help right away if: You have chest pain. You have trouble breathing. You have trouble swallowing. You vomit blood. You have black, tarry, or bloody stools. These symptoms may represent a serious problem that is an emergency. Do not wait to see if the symptoms will go away. Get medical help right away. Call your local emergency services (911 in the [place] Do not drive yourself to the hospital. Summary Esophageal dilatation, also called esophageal dilation, is a procedure to widen or open a blocked or narrowed part of the esophagus. Plan to have a responsible adult take you home from the hospital or clinic. For this procedure, a numbing medicine may be sprayed into the back of your throat, or you may gargle the medicine. Do not drive or operate machinery until your health care provider says that it is safe. This information is not intended to replace advice given to you by your health care provider. Make sure you discuss any questions you have with your healthcare provider. Document Revised: 06/21/2019 Document Reviewed: 06/21/2019 Elsevier Patient Education  [place]. Colonoscopy, Adult, Care After This sheet gives you information about how to care for yourself after your procedure. Your health care provider may also give you more specific instructions. If you have problems or questions, contact your health careprovider. What can I expect after the procedure? After the procedure, it is common to have: A small amount of blood in your stool for 24 hours after the procedure. Some gas. Mild cramping or bloating of your abdomen. Follow these instructions at home: Eating and drinking  Drink enough fluid to keep your urine pale yellow. Follow instructions from your health care provider about eating or drinking restrictions. Resume your normal diet as instructed by your health care provider. Avoid heavy or fried foods that are hard to digest.  Activity Rest as told by your health care  provider. Avoid sitting for a long time without moving. Get up to take short walks every 1-2 hours. This is important to improve blood flow and breathing. Ask for help if you feel weak or unsteady. Return to your normal activities as told by your health care provider. Ask your health care provider what activities are safe for you. Managing cramping and bloating  Try walking around when you have cramps or feel bloated. Apply heat to your abdomen as told by your health care provider. Use the heat source that your health care provider recommends, such as a moist heat pack or a heating pad. Place a towel between your skin and the heat source. Leave the heat on for 20-30 minutes. Remove the heat if your skin turns bright red. This is especially important if you are unable to feel pain, heat, or cold. You may have a greater risk of getting burned.  General instructions  If you were given a sedative during the procedure, it can affect you for several hours. Do not drive or operate machinery until your health care provider says that it is safe. For the first 24 hours after the procedure: Do not sign important documents. Do not drink alcohol. Do your regular daily activities at a slower pace than normal. Eat soft foods that are easy to digest. Take over-the-counter and prescription medicines only as told by your health care provider. Keep all follow-up visits as told by your health care provider. This is important. Contact a health care provider if: You have blood in your stool 2-3 days after the procedure. Get help right away if you have: More than a small spotting of blood in your stool. Large blood clots in your stool. Swelling of your abdomen. Nausea or vomiting. A fever. Increasing pain in your abdomen that is not relieved with medicine. Summary After the procedure, it is common to have a small amount of blood in your stool. You may also have mild cramping and bloating of your abdomen. If  you were given a sedative during the procedure, it can affect you for several hours. Do not drive or operate machinery until your health care provider says that it is safe. Get help right away if you have a lot of blood in your stool, nausea or vomiting, a fever, or increased pain in your abdomen. This information is not intended to replace advice given to you by your health care provider. Make sure you discuss any questions you have with your healthcare provider. Document Revised: 01/27/2019 Document Reviewed: 08/29/2018 Elsevier Patient Education  [place] After This sheet gives you information about how to care for yourself after your procedure. Your health care provider may also give you more specific instructions. If you have problems or questions, contact your health careprovider. What can I expect after the procedure? After the procedure, it is common to have: Tiredness. Forgetfulness about what happened after the procedure. Impaired judgment for important decisions. Nausea or vomiting. Some difficulty with balance. Follow these instructions at home: For the time period you were told by your health care provider:     Rest as needed. Do not participate in activities where you could fall or become injured. Do not drive or use machinery. Do not drink alcohol. Do not take sleeping pills or medicines that cause drowsiness. Do not make important decisions or sign legal documents. Do not take care of children on your own. Eating and drinking Follow the diet that is recommended by your health care provider. Drink enough fluid to keep your urine pale yellow. If you vomit: Drink water, juice, or soup when you can drink without vomiting. Make sure you have little or no nausea before eating solid foods. General instructions Have a responsible adult stay with you for the time you are told. It is important to have someone help care for you until  you are awake and alert. Take over-the-counter and prescription medicines only as told by your health care provider. If you have sleep apnea, surgery and certain medicines can increase your risk for breathing problems. Follow instructions from your health care provider about wearing your sleep device: Anytime you are sleeping, including during daytime naps. While taking prescription pain medicines, sleeping medicines, or medicines that make you drowsy. Avoid smoking. Keep all follow-up visits as told by your health care provider. This is important. Contact a health care provider if: You keep feeling nauseous  or you keep vomiting. You feel light-headed. You are still sleepy or having trouble with balance after 24 hours. You develop a rash. You have a fever. You have redness or swelling around the IV site. Get help right away if: You have trouble breathing. You have new-onset confusion at home. Summary For several hours after your procedure, you may feel tired. You may also be forgetful and have poor judgment. Have a responsible adult stay with you for the time you are told. It is important to have someone help care for you until you are awake and alert. Rest as told. Do not drive or operate machinery. Do not drink alcohol or take sleeping pills. Get help right away if you have trouble breathing, or if you suddenly become confused. This information is not intended to replace advice given to you by your health care provider. Make sure you discuss any questions you have with your healthcare provider. Document Revised: 10/19/2019 Document Reviewed: 01/05/2019 Elsevier Patient Education  2022 [employer].

## 2020-09-09 ENCOUNTER — Encounter (HOSPITAL_COMMUNITY)
Admission: RE | Admit: 2020-09-09 | Discharge: 2020-09-09 | Disposition: A | Discharge status: Home / Self Care | Payer: Medicare HMO | Source: Ambulatory Visit | Attending: Internal Medicine | Admitting: Internal Medicine

## 2020-09-09 ENCOUNTER — Encounter (HOSPITAL_COMMUNITY): Payer: Self-pay

## 2020-09-09 ENCOUNTER — Other Ambulatory Visit: Payer: Self-pay

## 2020-09-11 ENCOUNTER — Ambulatory Visit (HOSPITAL_COMMUNITY): Payer: Medicare HMO | Admitting: Anesthesiology

## 2020-09-11 ENCOUNTER — Encounter (HOSPITAL_COMMUNITY)
Admission: RE | Disposition: A | Discharge status: Home / Self Care | Payer: Self-pay | Source: Ambulatory Visit | Attending: Internal Medicine

## 2020-09-11 ENCOUNTER — Ambulatory Visit (HOSPITAL_COMMUNITY)
Admission: RE | Admit: 2020-09-11 | Discharge: 2020-09-11 | Disposition: A | Discharge status: Home / Self Care | Payer: Medicare HMO | Source: Ambulatory Visit | Attending: Internal Medicine | Admitting: Internal Medicine

## 2020-09-11 ENCOUNTER — Other Ambulatory Visit: Payer: Self-pay

## 2020-09-11 ENCOUNTER — Encounter (HOSPITAL_COMMUNITY): Payer: Self-pay | Admitting: Internal Medicine

## 2020-09-11 DIAGNOSIS — K644 Residual hemorrhoidal skin tags: Secondary | ICD-10-CM | POA: Insufficient documentation

## 2020-09-11 DIAGNOSIS — K449 Diaphragmatic hernia without obstruction or gangrene: Secondary | ICD-10-CM | POA: Diagnosis not present

## 2020-09-11 DIAGNOSIS — K319 Disease of stomach and duodenum, unspecified: Secondary | ICD-10-CM | POA: Diagnosis not present

## 2020-09-11 DIAGNOSIS — K219 Gastro-esophageal reflux disease without esophagitis: Secondary | ICD-10-CM | POA: Insufficient documentation

## 2020-09-11 DIAGNOSIS — Z1211 Encounter for screening for malignant neoplasm of colon: Secondary | ICD-10-CM | POA: Insufficient documentation

## 2020-09-11 DIAGNOSIS — K2960 Other gastritis without bleeding: Secondary | ICD-10-CM | POA: Diagnosis not present

## 2020-09-11 DIAGNOSIS — Z09 Encounter for follow-up examination after completed treatment for conditions other than malignant neoplasm: Secondary | ICD-10-CM | POA: Diagnosis not present

## 2020-09-11 DIAGNOSIS — K2970 Gastritis, unspecified, without bleeding: Secondary | ICD-10-CM | POA: Diagnosis not present

## 2020-09-11 DIAGNOSIS — R1314 Dysphagia, pharyngoesophageal phase: Secondary | ICD-10-CM | POA: Insufficient documentation

## 2020-09-11 DIAGNOSIS — D122 Benign neoplasm of ascending colon: Secondary | ICD-10-CM | POA: Insufficient documentation

## 2020-09-11 DIAGNOSIS — K6289 Other specified diseases of anus and rectum: Secondary | ICD-10-CM | POA: Diagnosis not present

## 2020-09-11 DIAGNOSIS — J45909 Unspecified asthma, uncomplicated: Secondary | ICD-10-CM | POA: Diagnosis not present

## 2020-09-11 DIAGNOSIS — K3189 Other diseases of stomach and duodenum: Secondary | ICD-10-CM | POA: Diagnosis not present

## 2020-09-11 DIAGNOSIS — Z86010 Personal history of colonic polyps: Secondary | ICD-10-CM | POA: Diagnosis not present

## 2020-09-11 HISTORY — PX: COLONOSCOPY WITH PROPOFOL: SHX5780

## 2020-09-11 HISTORY — PX: POLYPECTOMY: SHX5525

## 2020-09-11 HISTORY — PX: ESOPHAGEAL DILATION: SHX303

## 2020-09-11 HISTORY — PX: ESOPHAGOGASTRODUODENOSCOPY (EGD) WITH PROPOFOL: SHX5813

## 2020-09-11 HISTORY — PX: BIOPSY: SHX5522

## 2020-09-11 SURGERY — COLONOSCOPY WITH PROPOFOL
Anesthesia: General

## 2020-09-11 MED ORDER — LIDOCAINE HCL (CARDIAC) PF 100 MG/5ML IV SOSY
PREFILLED_SYRINGE | INTRAVENOUS | Status: DC | PRN
  Administered 2020-09-11: 100 mg via INTRAVENOUS

## 2020-09-11 MED ORDER — PROPOFOL 10 MG/ML IV BOLUS
INTRAVENOUS | Status: DC | PRN
  Administered 2020-09-11 (×3): 50 mg via INTRAVENOUS
  Administered 2020-09-11: 100 mg via INTRAVENOUS

## 2020-09-11 MED ORDER — LACTATED RINGERS IV SOLN: INTRAVENOUS | Status: DC

## 2020-09-11 MED ORDER — PROPOFOL 500 MG/50ML IV EMUL
INTRAVENOUS | Status: DC | PRN
  Administered 2020-09-11: 150 ug/kg/min via INTRAVENOUS

## 2020-09-11 NOTE — Discharge Instructions (Addendum)
No aspirin or NSAIDs for 24 hours. Resume usual medications as before. Resume usual diet. No driving for 24 hours. Physician will call with biopsy results.

## 2020-09-11 NOTE — Anesthesia Procedure Notes (Signed)
Date/Time: 09/11/2020 10:45 AM Performed by: Orlie Dakin, CRNA Pre-anesthesia Checklist: Patient identified, Emergency Drugs available, Suction available and Patient being monitored Patient Re-evaluated:Patient Re-evaluated prior to induction Oxygen Delivery Method: Nasal cannula Induction Type: IV induction Placement Confirmation: positive ETCO2

## 2020-09-11 NOTE — Anesthesia Preprocedure Evaluation (Addendum)
Anesthesia Evaluation  Patient identified by MRN, date of birth, ID band Patient awake    Reviewed: Allergy & Precautions, H&P , NPO status , Patient's Chart, lab work & pertinent test results, reviewed documented beta blocker date and time   History of Anesthesia Complications (+) PONV and history of anesthetic complications  Airway Mallampati: II  TM Distance: >3 FB Neck ROM: full    Dental  (+) Loose,    Pulmonary asthma , sleep apnea ,    Pulmonary exam normal breath sounds clear to auscultation       Cardiovascular Exercise Tolerance: Good negative cardio ROS   Rhythm:regular Rate:Normal     Neuro/Psych  Headaches, PSYCHIATRIC DISORDERS Anxiety Depression  Neuromuscular disease    GI/Hepatic Neg liver ROS, PUD, GERD  Medicated,  Endo/Other  negative endocrine ROS  Renal/GU negative Renal ROS  negative genitourinary   Musculoskeletal   Abdominal   Peds  Hematology negative hematology ROS (+)   Anesthesia Other Findings Patient experiences episodes of laryngospasm sporadically during her daily life.  It tends to break without treatment.  Reproductive/Obstetrics negative OB ROS                            Anesthesia Physical Anesthesia Plan  ASA: 3  Anesthesia Plan: General   Post-op Pain Management:    Induction:   PONV Risk Score and Plan: Propofol infusion  Airway Management Planned:   Additional Equipment:   Intra-op Plan:   Post-operative Plan:   Informed Consent: I have reviewed the patients History and Physical, chart, labs and discussed the procedure including the risks, benefits and alternatives for the proposed anesthesia with the patient or authorized representative who has indicated his/her understanding and acceptance.     Dental Advisory Given  Plan Discussed with: CRNA  Anesthesia Plan Comments:         Anesthesia Quick Evaluation

## 2020-09-11 NOTE — Anesthesia Postprocedure Evaluation (Signed)
Anesthesia Post Note  Patient: Ruth [name]  Procedure(s) Performed: COLONOSCOPY WITH PROPOFOL ESOPHAGOGASTRODUODENOSCOPY (EGD) WITH PROPOFOL ESOPHAGEAL DILATION BIOPSY POLYPECTOMY  Patient location during evaluation: Phase II Anesthesia Type: General Level of consciousness: awake Pain management: pain level controlled Vital Signs Assessment: post-procedure vital signs reviewed and stable Respiratory status: spontaneous breathing and respiratory function stable Cardiovascular status: blood pressure returned to baseline and stable Postop Assessment: no headache and no apparent nausea or vomiting Anesthetic complications: no Comments: Late entry   No notable events documented.   Last Vitals:  Vitals:   09/11/20 0944 09/11/20 1130  BP: (!) 146/58 127/74  Pulse: 85 79  Resp:  16  Temp: 36.8 C (!) 36.3 C  SpO2: 95% 98%    Last Pain:  Vitals:   09/11/20 1152  TempSrc:   PainSc: [place]

## 2020-09-11 NOTE — Transfer of Care (Signed)
Immediate Anesthesia Transfer of Care Note  Patient: RISA BALLI  Procedure(s) Performed: COLONOSCOPY WITH PROPOFOL ESOPHAGOGASTRODUODENOSCOPY (EGD) WITH PROPOFOL ESOPHAGEAL DILATION BIOPSY POLYPECTOMY  Patient Location: Short Stay  Anesthesia Type:General  Level of Consciousness: awake, alert  and oriented  Airway & Oxygen Therapy: Patient Spontanous Breathing  Post-op Assessment: Report given to RN, Post -op Vital signs reviewed and stable and Patient moving all extremities X 4  Post vital signs: Reviewed and stable  Last Vitals:  Vitals Value Taken Time  BP    Temp    Pulse    Resp    SpO2      Last Pain:  Vitals:   09/11/20 1043  TempSrc:   PainSc: 0-No pain         Complications: No notable events documented.

## 2020-09-11 NOTE — H&P (Signed)
Ruth [name] is an 80 y.o. female.   Chief Complaint: Patient is here for esophagogastroduodenoscopy with esophageal dilation and colonoscopy. HPI: Patient is 80 year old Caucasian female who has chronic GERD who presents with dysphagia to solids.  Few months ago she had barium study which was unremarkable.  She is undergoing diagnostic evaluation along with esophageal dilation before considering other studies.  She points to suprasternal area sort of bolus obstruction.  She has difficult primarily with meats. She also has history of colonic adenomas and is here for surveillance colonoscopy.  She denies abdominal pain or rectal bleeding. Family history significant for colonic polyps in mother and 2 sisters.  Family history is negative for CRC.  Past Medical History:  Diagnosis Date   Anxiety    Arthritis    Asthma    Depression    Eczema    Fibromyalgia    GERD (gastroesophageal reflux disease)    EGD/ colon 1/09   H pylori ulcer 1980-1990   s/p treatment   Headache    Hyperlipidemia    IBS (irritable bowel syndrome)    Laryngospasm    Neck pain    OSA (obstructive sleep apnea)    PONV (postoperative nausea and vomiting)    Refusal of blood transfusions as patient is Jehovah's Witness    Vertigo     Past Surgical History:  Procedure Laterality Date   ABDOMINAL HYSTERECTOMY  [place]  2002   [facility]) normal coronary arteries    COLONOSCOPY  01/2007   Dr. Meriel Flavors   COLONOSCOPY N/A 04/18/2015   Procedure: COLONOSCOPY;  Surgeon: Rogene Houston, MD;  Location: [facility];  Service: Endoscopy;  Laterality: N/A;  830 - moved to 8:55 - Ann to notify pt   COLONOSCOPY WITH ESOPHAGOGASTRODUODENOSCOPY (EGD)  02/16/2012   Procedure: COLONOSCOPY WITH ESOPHAGOGASTRODUODENOSCOPY (EGD);  Surgeon: Daneil Dolin, MD;  Location: [facility];  Service: Endoscopy;  Laterality: N/A;  8:45   ESOPHAGEAL  DILATION N/A 06/07/2014   Procedure: ESOPHAGEAL DILATION;  Surgeon: Rogene Houston, MD;  Location: [facility];  Service: Endoscopy;  Laterality: N/A;   ESOPHAGOGASTRODUODENOSCOPY  01/2007   Dr Sharlett Iles- 3 cm hiatal hernia, benign esophageal biopsies, erosive esophagitis, gastritis   ESOPHAGOGASTRODUODENOSCOPY N/A 06/07/2014   Procedure: ESOPHAGOGASTRODUODENOSCOPY (EGD);  Surgeon: Rogene Houston, MD;  [facility]: [facility];  Service: Endoscopy;  Laterality: N/A;  1200   ESOPHAGOGASTRODUODENOSCOPY N/A 08/18/2017   Procedure: ESOPHAGOGASTRODUODENOSCOPY (EGD);  Surgeon: Rogene Houston, MD;  [facility]: [facility];  Service: Endoscopy;  Laterality: N/A;  2:20   FOOT SURGERY     INNER EAR SURGERY     Left   KNEE SURGERY     Multiple   SHOULDER SURGERY  2011   TONSILLECTOMY     TOTAL KNEE ARTHROPLASTY Left 02/07/2018   Procedure: TOTAL KNEE ARTHROPLASTY;  Surgeon: Gaynelle Arabian, MD;  Location: [facility];  Service: Orthopedics;  Laterality: Left;  28mn   TUBAL LIGATION     TYMPANOSTOMY TUBE PLACEMENT      Family History  Problem Relation Age of Onset   Diabetes Father    Heart attack Father        569's  Lung cancer Father    Cirrhosis Father 525      etoh cirrhosis   Eczema Father    Lung cancer Sister    Depression Sister    Alzheimer's disease  Sister    Hypertension Sister    Diabetes Sister    Lung cancer Sister    Paranoid behavior Mother        depression   Colon polyps Mother    Eczema Mother    Depression Mother    COPD Daughter    Depression Maternal Aunt    Drug abuse Niece    Suicidality Niece    Allergic rhinitis Neg Hx    Asthma Neg Hx    Urticaria Neg Hx    Social History:  reports that she has never smoked. She has never used smokeless tobacco. She reports current alcohol use. She reports that she does not use drugs.  Allergies:  Allergies  Allergen Reactions   Blood-Group Specific Substance Other (See Comments)    NO BLOOD PRODUCTS-refuses  transfusions   Ciprofloxacin Other (See Comments)    Pt reports extreme fatigue   Codeine Nausea And Vomiting   Latex Other (See Comments)    Red and raw area around incision   Prozac [Fluoxetine]     Causing stomach pain and anxiety  - pt takes this medication daily   Sertraline Hcl     Can not remember reaction   Vicodin [Hydrocodone-Acetaminophen] Nausea And Vomiting   Tramadol Swelling, Palpitations and Hypertension    Increased heart rate,increased blood pressure, edema    Medications Prior to Admission  Medication Sig Dispense Refill   acidophilus (RISAQUAD) CAPS capsule Take 1 capsule by mouth daily.     Ascorbic Acid (VITAMIN C PO) Take 1 tablet by mouth daily.     b complex vitamins capsule Take 1 capsule by mouth daily.     Cholecalciferol (VITAMIN D3 PO) Take 1 tablet by mouth daily.     diazepam (VALIUM) 5 MG tablet 1/2 to 1 taken 3 times daily as needed for severe anxiousness use sparingly (Patient taking differently: Take 2.5-5 mg by mouth 3 (three) times daily as needed for anxiety.) 30 tablet 0   dicyclomine (BENTYL) 10 MG capsule Take 1 capsule (10 mg total) by mouth 3 (three) times daily as needed. For stomach cramps 90 capsule 5   famotidine (PEPCID) 40 MG tablet Take 1 tablet (40 mg total) by mouth at bedtime. (Patient taking differently: Take 40 mg by mouth daily as needed for heartburn or indigestion.) 30 tablet 5   FLUoxetine (PROZAC) 20 MG tablet Take 20 mg by mouth daily.     ketoconazole (NIZORAL) 2 % cream Apply 1 application topically daily. (Patient taking differently: Apply 1 application topically daily as needed for irritation.) 60 g 5   MAGNESIUM PO Take 1 tablet by mouth daily.     Polyvinyl Alcohol (LUBRICANT DROPS OP) Place 2 drops into both eyes 3 (three) times daily as needed (for dry eyes).     albuterol (PROVENTIL HFA;VENTOLIN HFA) 108 (90 Base) MCG/ACT inhaler Inhale 2 puffs into the lungs every 6 (six) hours as needed for wheezing. 1 Inhaler 2    hydrOXYzine (ATARAX/VISTARIL) 25 MG tablet Take 1 tablet (25 mg total) by mouth every 6 (six) hours as needed for anxiety. (Patient not taking: No sig reported) 60 tablet 2   Na Sulfate-K Sulfate-Mg Sulf (SUPREP BOWEL PREP KIT) 17.5-3.13-1.6 GM/177ML SOLN Take 1 kit by mouth as directed. 354 mL 0   nitroGLYCERIN (NITROSTAT) 0.4 MG SL tablet Place 1 tablet (0.4 mg total) under the tongue every 5 (five) minutes as needed for chest pain. (Patient not taking: Reported on 09/03/2020) 20 tablet 0  No results found for this or any previous visit (from the past 48 hour(s)). No results found.  Review of Systems  Blood pressure (!) 146/58, pulse 85, temperature 98.2 F (36.8 C), temperature source Oral, SpO2 95 %. Physical Exam HENT:     Mouth/Throat:     Mouth: Mucous membranes are moist.     Pharynx: Oropharynx is clear.  Eyes:     General: No scleral icterus.    Conjunctiva/sclera: Conjunctivae normal.  Cardiovascular:     Rate and Rhythm: Normal rate and regular rhythm.     Heart sounds: Normal heart sounds. No murmur heard. Pulmonary:     Effort: Pulmonary effort is normal.     Breath sounds: Normal breath sounds.  Abdominal:     General: There is no distension.     Palpations: Abdomen is soft. There is no mass.     Tenderness: There is no abdominal tenderness.  Musculoskeletal:        General: No swelling.     Cervical back: Neck supple.  Lymphadenopathy:     Cervical: No cervical adenopathy.  Skin:    General: Skin is warm and dry.  Neurological:     Mental Status: She is alert.     Assessment/Plan  Esophageal dysphagia History of colonic adenomas. Esophagogastroduodenoscopy with esophageal dilation followed by surveillance colonoscopy.  Hildred Laser, MD 09/11/2020, 10:39 AM

## 2020-09-11 NOTE — Op Note (Signed)
[facility] Patient Name: Ruth [name] Procedure Date: 09/11/2020 11:02 AM MRN: [id number] Date of Birth: [birth year] Attending MD: Hildred Laser , MD CSN: [id number] Age: 80 Admit Type: Outpatient Procedure:                Colonoscopy Indications:              High risk colon cancer surveillance: Personal                            history of colonic polyps Providers:                Hildred Laser, MD, Gwenlyn Fudge, RN, Randa Spike, Technician Referring MD:             Elayne Snare. Luking, MD Medicines:                Propofol per Anesthesia Complications:            No immediate complications. Estimated Blood Loss:     Estimated blood loss was minimal. Procedure:                Pre-Anesthesia Assessment:                           - Prior to the procedure, a History and Physical                            was performed, and patient medications and                            allergies were reviewed. The patient's tolerance of                            previous anesthesia was also reviewed. The risks                            and benefits of the procedure and the sedation                            options and risks were discussed with the patient.                            All questions were answered, and informed consent                            was obtained. Prior Anticoagulants: The patient has                            taken no previous anticoagulant or antiplatelet                            agents. ASA Grade Assessment: II - A patient with  mild systemic disease. After reviewing the risks                            and benefits, the patient was deemed in                            satisfactory condition to undergo the procedure.                           After obtaining informed consent, the colonoscope                            was passed under direct vision. Throughout the                            procedure, the  patient's blood pressure, pulse, and                            oxygen saturations were monitored continuously. The                            [id number]) scope was introduced through                            the anus and advanced to the the cecum, identified                            by appendiceal orifice and ileocecal valve. The                            colonoscopy was performed without difficulty. The                            patient tolerated the procedure well. The quality                            of the bowel preparation was good. The ileocecal                            valve, appendiceal orifice, and rectum were                            photographed. Scope In: 11:04:26 AM Scope Out: 11:22:54 AM Scope Withdrawal Time: 0 hours 12 minutes 59 seconds  Total Procedure Duration: 0 hours 18 minutes 28 seconds  Findings:      The perianal and digital rectal examinations were normal.      A small polyp was found in the ascending colon. The polyp was sessile.       Biopsies were taken with a cold forceps for histology. The pathology       specimen was placed into Bottle Number 2.      The exam was otherwise normal throughout the examined colon.      External hemorrhoids were found during retroflexion. The hemorrhoids       were medium-sized.  Anal papilla(e) were hypertrophied. Impression:               - One small polyp in the ascending colon. Biopsied.                           - External hemorrhoids.                           - Anal papilla(e) were hypertrophied. Moderate Sedation:      Per Anesthesia Care Recommendation:           - Patient has a contact number available for                            emergencies. The signs and symptoms of potential                            delayed complications were discussed with the                            patient. Return to normal activities tomorrow.                            Written discharge instructions were  provided to the                            patient.                           - Resume previous diet today.                           - Continue present medications.                           - No aspirin, ibuprofen, naproxen, or other                            non-steroidal anti-inflammatory drugs for 1 day.                           - Await pathology results.                           - Repeat colonoscopy is recommended. The                            colonoscopy date will be determined after pathology                            results from today's exam become available for                            review. Procedure Code(s):        --- Professional ---  45380, Colonoscopy, flexible; with biopsy, single                            or multiple Diagnosis Code(s):        --- Professional ---                           Z86.010, Personal history of colonic polyps                           K63.5, Polyp of colon                           K62.89, Other specified diseases of anus and rectum                           K64.4, Residual hemorrhoidal skin tags CPT copyright 2019 American Medical Association. All rights reserved. The codes documented in this report are preliminary and upon coder review may  be revised to meet current compliance requirements. Hildred Laser, MD Hildred Laser, MD 09/11/2020 7:33:33 PM This report has been signed electronically. Number of Addenda: 0

## 2020-09-11 NOTE — Op Note (Signed)
[facility] Patient Name: Ruth [name] Procedure Date: 09/11/2020 10:21 AM MRN: [id number] Date of Birth: [birth year] Attending MD: Hildred Laser , MD CSN: [id number] Age: 80 Admit Type: Outpatient Procedure:                Upper GI endoscopy Indications:              Esophageal dysphagia Providers:                Hildred Laser, MD, Gwenlyn Fudge, RN, Randa Spike, Technician Referring MD:             Elayne Snare. Luking, MD Medicines:                Propofol per Anesthesia Complications:            No immediate complications. Estimated Blood Loss:     Estimated blood loss was minimal. Procedure:                Pre-Anesthesia Assessment:                           - Prior to the procedure, a History and Physical                            was performed, and patient medications and                            allergies were reviewed. The patient's tolerance of                            previous anesthesia was also reviewed. The risks                            and benefits of the procedure and the sedation                            options and risks were discussed with the patient.                            All questions were answered, and informed consent                            was obtained. Prior Anticoagulants: The patient has                            taken no previous anticoagulant or antiplatelet                            agents. ASA Grade Assessment: II - A patient with                            mild systemic disease. After reviewing the risks  and benefits, the patient was deemed in                            satisfactory condition to undergo the procedure.                           After obtaining informed consent, the endoscope was                            passed under direct vision. Throughout the                            procedure, the patient's blood pressure, pulse, and                            oxygen  saturations were monitored continuously. The                            [id number]) scope was introduced through the                            mouth, and advanced to the second part of duodenum.                            The upper GI endoscopy was accomplished without                            difficulty. The patient tolerated the procedure                            well. Scope In: 10:48:32 AM Scope Out: 11:00:55 AM Total Procedure Duration: 0 hours 12 minutes 23 seconds  Findings:      The hypopharynx was normal.      The examined esophagus was normal.      The Z-line was regular and was found 37 cm from the incisors.      A 2 cm hiatal hernia was present.      No endoscopic abnormality was evident in the esophagus to explain the       patient's complaint of dysphagia. It was decided, however, to proceed       with dilation of the entire esophagus. The scope was withdrawn. Dilation       was performed with a Maloney dilator with no resistance at 48 Fr. The       dilation site was examined following endoscope reinsertion and showed no       change and no bleeding, mucosal tear or perforation.      Localized mild inflammation characterized by erosions and erythema was       found in the gastric body. Biopsies were taken with a cold forceps for       histology.      The exam of the stomach was otherwise normal.      The duodenal bulb and second portion of the duodenum were normal. Impression:               - Normal hypopharynx.                           -  Normal esophagus.                           - Z-line regular, 37 cm from the incisors.                           - 2 cm hiatal hernia.                           - No endoscopic esophageal abnormality to explain                            patient's dysphagia. Esophagus dilated. Dilated.                           - Gastritis. Biopsied.                           - Normal duodenal bulb and second portion of the                             duodenum. Moderate Sedation:      Per Anesthesia Care Recommendation:           - Resume previous diet today.                           - Continue present medications.                           - No aspirin, ibuprofen, naproxen, or other                            non-steroidal anti-inflammatory drugs for 1 day.                           - Await pathology results. Procedure Code(s):        --- Professional ---                           [phone], Esophagogastroduodenoscopy, flexible,                            transoral; with biopsy, single or multiple                           43450, Dilation of esophagus, by unguided sound or                            bougie, single or multiple passes Diagnosis Code(s):        --- Professional ---                           K44.9, Diaphragmatic hernia without obstruction or                            gangrene  K29.70, Gastritis, unspecified, without bleeding                           R13.14, Dysphagia, pharyngoesophageal phase CPT copyright 2019 American Medical Association. All rights reserved. The codes documented in this report are preliminary and upon coder review may  be revised to meet current compliance requirements. Hildred Laser, MD Hildred Laser, MD 09/11/2020 7:26:57 PM This report has been signed electronically. Number of Addenda: 0

## 2020-09-12 LAB — SURGICAL PATHOLOGY

## 2020-09-13 ENCOUNTER — Encounter (HOSPITAL_COMMUNITY): Payer: Self-pay | Admitting: Internal Medicine

## 2020-09-19 ENCOUNTER — Other Ambulatory Visit (INDEPENDENT_AMBULATORY_CARE_PROVIDER_SITE_OTHER): Payer: Self-pay | Admitting: *Deleted

## 2020-09-19 MED ORDER — FAMOTIDINE 40 MG PO TABS: 40.0000 mg | ORAL_TABLET | Freq: Every day | ORAL | 1 refills | Status: DC

## 2020-09-23 DIAGNOSIS — M25411 Effusion, right shoulder: Secondary | ICD-10-CM | POA: Diagnosis not present

## 2020-09-23 DIAGNOSIS — M67921 Unspecified disorder of synovium and tendon, right upper arm: Secondary | ICD-10-CM | POA: Diagnosis not present

## 2020-09-23 DIAGNOSIS — M94211 Chondromalacia, right shoulder: Secondary | ICD-10-CM | POA: Diagnosis not present

## 2020-09-23 DIAGNOSIS — M75121 Complete rotator cuff tear or rupture of right shoulder, not specified as traumatic: Secondary | ICD-10-CM | POA: Diagnosis not present

## 2020-09-23 DIAGNOSIS — M62511 Muscle wasting and atrophy, not elsewhere classified, right shoulder: Secondary | ICD-10-CM | POA: Diagnosis not present

## 2020-09-23 DIAGNOSIS — M19011 Primary osteoarthritis, right shoulder: Secondary | ICD-10-CM | POA: Diagnosis not present

## 2020-09-27 DIAGNOSIS — M25511 Pain in right shoulder: Secondary | ICD-10-CM | POA: Diagnosis not present

## 2020-10-07 DIAGNOSIS — G4700 Insomnia, unspecified: Secondary | ICD-10-CM | POA: Diagnosis not present

## 2020-10-07 DIAGNOSIS — F331 Major depressive disorder, recurrent, moderate: Secondary | ICD-10-CM | POA: Diagnosis not present

## 2020-10-10 DIAGNOSIS — N281 Cyst of kidney, acquired: Secondary | ICD-10-CM | POA: Diagnosis not present

## 2020-10-10 DIAGNOSIS — M75121 Complete rotator cuff tear or rupture of right shoulder, not specified as traumatic: Secondary | ICD-10-CM | POA: Diagnosis not present

## 2020-10-10 DIAGNOSIS — N200 Calculus of kidney: Secondary | ICD-10-CM | POA: Diagnosis not present

## 2020-10-18 DIAGNOSIS — N200 Calculus of kidney: Secondary | ICD-10-CM | POA: Diagnosis not present

## 2020-10-18 DIAGNOSIS — N281 Cyst of kidney, acquired: Secondary | ICD-10-CM | POA: Diagnosis not present

## 2020-10-29 ENCOUNTER — Ambulatory Visit (INDEPENDENT_AMBULATORY_CARE_PROVIDER_SITE_OTHER): Payer: Medicare HMO | Admitting: Family Medicine

## 2020-10-29 ENCOUNTER — Other Ambulatory Visit: Payer: Self-pay

## 2020-10-29 VITALS — BP 152/79 | HR 80 | Temp 97.2°F | Ht 65.0 in | Wt 216.0 lb

## 2020-10-29 DIAGNOSIS — R5383 Other fatigue: Secondary | ICD-10-CM | POA: Diagnosis not present

## 2020-10-29 DIAGNOSIS — D649 Anemia, unspecified: Secondary | ICD-10-CM | POA: Diagnosis not present

## 2020-10-29 DIAGNOSIS — M6281 Muscle weakness (generalized): Secondary | ICD-10-CM

## 2020-10-29 DIAGNOSIS — R7303 Prediabetes: Secondary | ICD-10-CM

## 2020-10-29 DIAGNOSIS — F411 Generalized anxiety disorder: Secondary | ICD-10-CM

## 2020-10-29 MED ORDER — DIAZEPAM 5 MG PO TABS: ORAL_TABLET | ORAL | 1 refills | Status: DC

## 2020-10-29 NOTE — Progress Notes (Signed)
   Subjective:    [name], female    DOB: 01/26/1941, 80 y.o.   MRN: [id number]  HPI Anxiety follow up  Patient relates under a lot of anxiety Finds her self feeling stressed She worries about her daughter and her son In addition to this also finds her self feeling very anxious nervous inside shaky  Shaking daily - on and off at times she thinks this is related to her diabetes other times she thinks is related to the anxiety that she just does not know she does tend to eat a big breakfast but does not eat much at lunchtime or dinner  Fatigue- weakness- check for diabetes She feels tired a lot low energy does not have much desire to do things   Review of Systems     Objective:   Physical Exam Lungs are clear heart regular pulse normal extremities no edema HEENT benign       Assessment & Plan:  1. Prediabetes Patient is concerned that her prediabetes is trending in the diabetes relates fatigue tiredness some increased thirst occasional weakness - Hemoglobin A1c - TSH - CBC with Differential/Platelet  2. Muscle weakness Complains of weakness in her legs does not do much activity due to her depression and anxiety  3. Other fatigue Check thyroid function await outcomes - Hemoglobin A1c - TSH - CBC with Differential/Platelet  4. GAD (generalized anxiety disorder) Long discussion held on this I believe the patient would benefit from some counseling through her psychiatrist Valium may be utilized but encouraged her to utilize it no more than 1/2 tablet or a whole tablet maximum usage 3 times per week patient to let us know how things are going continue her Prozac follow-up in 3 months  We did discuss other techniques to help alleviate peak anxiety when it occurs  5. Anemia, unspecified type Previously hemoglobin has been low with her having fatigue and low energy we will check CBC - Hemoglobin A1c - TSH - CBC with Differential/Platelet

## 2020-10-30 ENCOUNTER — Encounter: Payer: Self-pay | Admitting: Family Medicine

## 2020-10-30 NOTE — Progress Notes (Signed)
Please mail this to her psychiatry (Part of Dr Reddys group in [place])

## 2020-11-11 DIAGNOSIS — H40013 Open angle with borderline findings, low risk, bilateral: Secondary | ICD-10-CM | POA: Diagnosis not present

## 2020-11-11 DIAGNOSIS — H43391 Other vitreous opacities, right eye: Secondary | ICD-10-CM | POA: Diagnosis not present

## 2020-11-11 DIAGNOSIS — H5203 Hypermetropia, bilateral: Secondary | ICD-10-CM | POA: Diagnosis not present

## 2020-11-11 DIAGNOSIS — H04123 Dry eye syndrome of bilateral lacrimal glands: Secondary | ICD-10-CM | POA: Diagnosis not present

## 2020-11-11 DIAGNOSIS — H25813 Combined forms of age-related cataract, bilateral: Secondary | ICD-10-CM | POA: Diagnosis not present

## 2020-12-09 DIAGNOSIS — N281 Cyst of kidney, acquired: Secondary | ICD-10-CM | POA: Diagnosis not present

## 2020-12-09 DIAGNOSIS — H524 Presbyopia: Secondary | ICD-10-CM | POA: Diagnosis not present

## 2020-12-09 DIAGNOSIS — H52223 Regular astigmatism, bilateral: Secondary | ICD-10-CM | POA: Diagnosis not present

## 2020-12-09 DIAGNOSIS — R3121 Asymptomatic microscopic hematuria: Secondary | ICD-10-CM | POA: Diagnosis not present

## 2020-12-12 ENCOUNTER — Ambulatory Visit: Payer: Medicare HMO | Admitting: Family Medicine

## 2021-01-21 DIAGNOSIS — F331 Major depressive disorder, recurrent, moderate: Secondary | ICD-10-CM | POA: Diagnosis not present

## 2021-01-21 DIAGNOSIS — G4700 Insomnia, unspecified: Secondary | ICD-10-CM | POA: Diagnosis not present

## 2021-02-07 ENCOUNTER — Ambulatory Visit: Payer: Medicare HMO | Admitting: Family Medicine

## 2021-02-13 ENCOUNTER — Other Ambulatory Visit (INDEPENDENT_AMBULATORY_CARE_PROVIDER_SITE_OTHER): Payer: Self-pay | Admitting: Internal Medicine

## 2021-02-16 DIAGNOSIS — L409 Psoriasis, unspecified: Secondary | ICD-10-CM

## 2021-02-16 HISTORY — DX: Psoriasis, unspecified: L40.9

## 2021-02-19 NOTE — Progress Notes (Signed)
Sent message, via epic in basket, requesting orders in epic from surgeon.  

## 2021-02-25 ENCOUNTER — Other Ambulatory Visit: Payer: Self-pay

## 2021-02-25 ENCOUNTER — Telehealth: Payer: Self-pay

## 2021-02-25 ENCOUNTER — Ambulatory Visit (INDEPENDENT_AMBULATORY_CARE_PROVIDER_SITE_OTHER): Payer: Medicare HMO

## 2021-02-25 VITALS — Ht 65.0 in | Wt 209.0 lb

## 2021-02-25 DIAGNOSIS — Z0000 Encounter for general adult medical examination without abnormal findings: Secondary | ICD-10-CM

## 2021-02-25 NOTE — Telephone Encounter (Signed)
Prescription was written for this and put on my door thank you-it is signed and ready

## 2021-02-25 NOTE — Patient Instructions (Signed)
Ms. Ruth [name] , Thank you for taking time to come for your Medicare Wellness Visit. I appreciate your ongoing commitment to your health goals. Please review the following plan we discussed and let me know if I can assist you in the future.   Screening recommendations/referrals: Colonoscopy: Done 09/11/2020. No longer required. Mammogram: Done 05/17/2017. No longer required. Bone Density: Done 09/01/2016. Repeat every 2 years  Recommended yearly ophthalmology/optometry visit for glaucoma screening and checkup Recommended yearly dental visit for hygiene and checkup  Vaccinations: Influenza vaccine: Done 11/30/2020 Repeat annually  Pneumococcal vaccine: Done 02/15/2014 and 01/25/2020 Tdap vaccine: Due Repeat in 10 years  Shingles vaccine: Done 07/10/2016. Second dose due at your convenience.   Covid-19:Done 06/02/2020, 11/18/2019, 04/22/2019 and 03/24/2019.  Advanced directives: Please bring a copy of your health care power of attorney and living will to the office to be added to your chart at your convenience.   Conditions/risks identified: Aim for 30 minutes of exercise or  walking each day, drink 6-8 glasses of water and eat lots of fruits and vegetables.   Next appointment: Follow up in one year for your annual wellness visit 2024.   Preventive Care 36 Years and Older, Female Preventive care refers to lifestyle choices and visits with your health care provider that can promote health and wellness. What does preventive care include? A yearly physical exam. This is also called an annual well check. Dental exams once or twice a year. Routine eye exams. Ask your health care provider how often you should have your eyes checked. Personal lifestyle choices, including: Daily care of your teeth and gums. Regular physical activity. Eating a healthy diet. Avoiding tobacco and drug use. Limiting alcohol use. Practicing safe sex. Taking low-dose aspirin every day. Taking vitamin and mineral  supplements as recommended by your health care provider. What happens during an annual well check? The services and screenings done by your health care provider during your annual well check will depend on your age, overall health, lifestyle risk factors, and family history of disease. Counseling  Your health care provider may ask you questions about your: Alcohol use. Tobacco use. Drug use. Emotional well-being. Home and relationship well-being. Sexual activity. Eating habits. History of falls. Memory and ability to understand (cognition). Work and work Statistician. Reproductive health. Screening  You may have the following tests or measurements: Height, weight, and BMI. Blood pressure. Lipid and cholesterol levels. These may be checked every 5 years, or more frequently if you are over 65 years old. Skin check. Lung cancer screening. You may have this screening every year starting at age 20 if you have a 30-pack-year history of smoking and currently smoke or have quit within the past 15 years. Fecal occult blood test (FOBT) of the stool. You may have this test every year starting at age 70. Flexible sigmoidoscopy or colonoscopy. You may have a sigmoidoscopy every 5 years or a colonoscopy every 10 years starting at age 51. Hepatitis C blood test. Hepatitis B blood test. Sexually transmitted disease (STD) testing. Diabetes screening. This is done by checking your blood sugar (glucose) after you have not eaten for a while (fasting). You may have this done every 1-3 years. Bone density scan. This is done to screen for osteoporosis. You may have this done starting at age 63. Mammogram. This may be done every 1-2 years. Talk to your health care provider about how often you should have regular mammograms. Talk with your health care provider about your test results, treatment options, and  if necessary, the need for more tests. Vaccines  Your health care provider may recommend certain  vaccines, such as: Influenza vaccine. This is recommended every year. Tetanus, diphtheria, and acellular pertussis (Tdap, Td) vaccine. You may need a Td booster every 10 years. Zoster vaccine. You may need this after age 4. Pneumococcal 13-valent conjugate (PCV13) vaccine. One dose is recommended after age 67. Pneumococcal polysaccharide (PPSV23) vaccine. One dose is recommended after age 52. Talk to your health care provider about which screenings and vaccines you need and how often you need them. This information is not intended to replace advice given to you by your health care provider. Make sure you discuss any questions you have with your health care provider. Document Released: 03/01/2015 Document Revised: 10/23/2015 Document Reviewed: 12/04/2014 Elsevier Interactive Patient Education  2017 [place] Prevention in the Home Falls can cause injuries. They can happen to people of all ages. There are many things you can do to make your home safe and to help prevent falls. What can I do on the outside of my home? Regularly fix the edges of walkways and driveways and fix any cracks. Remove anything that might make you trip as you walk through a door, such as a raised step or threshold. Trim any bushes or trees on the path to your home. Use bright outdoor lighting. Clear any walking paths of anything that might make someone trip, such as rocks or tools. Regularly check to see if handrails are loose or broken. Make sure that both sides of any steps have handrails. Any raised decks and porches should have guardrails on the edges. Have any leaves, snow, or ice cleared regularly. Use sand or salt on walking paths during winter. Clean up any spills in your garage right away. This includes oil or grease spills. What can I do in the bathroom? Use night lights. Install grab bars by the toilet and in the tub and shower. Do not use towel bars as grab bars. Use non-skid mats or decals in  the tub or shower. If you need to sit down in the shower, use a plastic, non-slip stool. Keep the floor dry. Clean up any water that spills on the floor as soon as it happens. Remove soap buildup in the tub or shower regularly. Attach bath mats securely with double-sided non-slip rug tape. Do not have throw rugs and other things on the floor that can make you trip. What can I do in the bedroom? Use night lights. Make sure that you have a light by your bed that is easy to reach. Do not use any sheets or blankets that are too big for your bed. They should not hang down onto the floor. Have a firm chair that has side arms. You can use this for support while you get dressed. Do not have throw rugs and other things on the floor that can make you trip. What can I do in the kitchen? Clean up any spills right away. Avoid walking on wet floors. Keep items that you use a lot in easy-to-reach places. If you need to reach something above you, use a strong step stool that has a grab bar. Keep electrical cords out of the way. Do not use floor polish or wax that makes floors slippery. If you must use wax, use non-skid floor wax. Do not have throw rugs and other things on the floor that can make you trip. What can I do with my stairs? Do not leave any  items on the stairs. Make sure that there are handrails on both sides of the stairs and use them. Fix handrails that are broken or loose. Make sure that handrails are as long as the stairways. Check any carpeting to make sure that it is firmly attached to the stairs. Fix any carpet that is loose or worn. Avoid having throw rugs at the top or bottom of the stairs. If you do have throw rugs, attach them to the floor with carpet tape. Make sure that you have a light switch at the top of the stairs and the bottom of the stairs. If you do not have them, ask someone to add them for you. What else can I do to help prevent falls? Wear shoes that: Do not have high  heels. Have rubber bottoms. Are comfortable and fit you well. Are closed at the toe. Do not wear sandals. If you use a stepladder: Make sure that it is fully opened. Do not climb a closed stepladder. Make sure that both sides of the stepladder are locked into place. Ask someone to hold it for you, if possible. Clearly mark and make sure that you can see: Any grab bars or handrails. First and last steps. Where the edge of each step is. Use tools that help you move around (mobility aids) if they are needed. These include: Canes. Walkers. Scooters. Crutches. Turn on the lights when you go into a dark area. Replace any light bulbs as soon as they burn out. Set up your furniture so you have a clear path. Avoid moving your furniture around. If any of your floors are uneven, fix them. If there are any pets around you, be aware of where they are. Review your medicines with your doctor. Some medicines can make you feel dizzy. This can increase your chance of falling. Ask your doctor what other things that you can do to help prevent falls. This information is not intended to replace advice given to you by your health care provider. Make sure you discuss any questions you have with your health care provider. Document Released: 11/29/2008 Document Revised: 07/11/2015 Document Reviewed: 03/09/2014 Elsevier Interactive Patient Education  2017 [employer].

## 2021-02-25 NOTE — Progress Notes (Signed)
Subjective:   Ruth [name] is a 81 y.o. female who presents for Medicare Annual (Subsequent) preventive examination. Virtual Visit via Telephone Note  I connected with  Ruth [name] on 02/25/21 at  1:00 PM EST by telephone and verified that I am speaking with the correct person using two identifiers.  Location: Patient: HOME Provider: [facility] Persons participating in the virtual visit: patient/Nurse Health Advisor   I discussed the limitations, risks, security and privacy concerns of performing an evaluation and management service by telephone and the availability of in person appointments. The patient expressed understanding and agreed to proceed.  Interactive audio and video telecommunications were attempted between this nurse and patient, however failed, due to patient having technical difficulties OR patient did not have access to video capability.  We continued and completed visit with audio only.  Some vital signs may be absent or patient reported.   Chriss Driver, LPN  Review of Systems     Cardiac Risk Factors include: advanced age (>59men, >98 women);dyslipidemia;sedentary lifestyle;obesity (BMI >30kg/m2);Other (see comment), Risk factor comments: fibromyalgia  PHONE VISIT. PT AT HOME. NURSE AT [facility].    Objective:    Today's Vitals   02/25/21 1305 02/25/21 1308  Weight: 209 lb (94.8 kg)   Height: 5\' 5"  (1.651 m)   PainSc:  8    Body mass index is 34.78 kg/m.  Advanced Directives 02/25/2021 09/09/2020 08/09/2019 04/14/2018 03/30/2018 03/23/2018 02/23/2018  Does Patient Have a Medical Advance Directive? Yes Yes Yes Yes Yes Yes Yes  Type of Paramedic of [place];Living will [place];Living will [place];Living will Healthcare Power of [place] of [place] of [place]  Does patient want to make changes to medical advance directive? - No - Patient  declined No - Patient declined - - - -  Copy of [place] in Chart? No - copy requested No - copy requested No - copy requested Yes - validated most recent copy scanned in chart (See row information) Yes - validated most recent copy scanned in chart (See row information) Yes - validated most recent copy scanned in chart (See row information) Yes - validated most recent copy scanned in chart (See row information)  Would patient like information on creating a medical advance directive? - - - - - - -  Pre-existing out of facility DNR order (yellow form or pink MOST form) - - - - - - -    Current Medications (verified) Outpatient Encounter Medications as of 02/25/2021  Medication Sig   acetaminophen (TYLENOL) 500 MG tablet Take 1,000 mg by mouth every 6 (six) hours as needed for moderate pain.   acidophilus (RISAQUAD) CAPS capsule Take 1 capsule by mouth 4 (four) times a week.   albuterol (PROVENTIL HFA;VENTOLIN HFA) 108 (90 Base) MCG/ACT inhaler Inhale 2 puffs into the lungs every 6 (six) hours as needed for wheezing.   Ascorbic Acid (VITAMIN C PO) Take 1 tablet by mouth 4 (four) times a week.   b complex vitamins capsule Take 1 capsule by mouth 4 (four) times a week.   diazepam (VALIUM) 5 MG tablet 1/2 to 1 taken 3 times daily as needed for severe anxiousness use sparingly   dicyclomine (BENTYL) 10 MG capsule Take 1 capsule (10 mg total) by mouth 3 (three) times daily as needed. For stomach cramps   famotidine (PEPCID) 40 MG tablet TAKE 1 TABLET  BY MOUTH AT  BEDTIME.   FLUoxetine (PROZAC) 20 MG tablet Take 20 mg by mouth daily.   fluticasone (CUTIVATE) 0.05 % cream Apply 1 application topically 2 (two) times daily.   ketoconazole (NIZORAL) 2 % cream Apply 1 application topically daily. (Patient taking differently: Apply 1 application topically daily as needed for irritation.)   MAGNESIUM PO Take 1 tablet by mouth 4 (four) times a week.   Polyvinyl Alcohol (LUBRICANT DROPS OP)  Place 2 drops into both eyes 3 (three) times daily as needed (for dry eyes).   No facility-administered encounter medications on file as of 02/25/2021.    Allergies (verified) Blood-group specific substance, Ciprofloxacin, Codeine, Latex, Prozac [fluoxetine], Sertraline hcl, Vicodin [hydrocodone-acetaminophen], and Tramadol   History: Past Medical History:  Diagnosis Date   Anxiety    Arthritis    Asthma    Depression    Eczema    Fibromyalgia    GERD (gastroesophageal reflux disease)    EGD/ colon 1/09   H pylori ulcer 1980-1990   s/p treatment   Headache    Hyperlipidemia    IBS (irritable bowel syndrome)    Laryngospasm    Neck pain    OSA (obstructive sleep apnea)    PONV (postoperative nausea and vomiting)    Refusal of blood transfusions as patient is Jehovah's Witness    Vertigo    Past Surgical History:  Procedure Laterality Date   [place]   BIOPSY  09/11/2020   Procedure: BIOPSY;  Surgeon: Rogene Houston, MD;  [facility];  Service: Endoscopy;;  gastric   CARDIAC CATHETERIZATION  2002   [facility]) normal coronary arteries    COLONOSCOPY  01/2007   Dr. Meriel Flavors   COLONOSCOPY N/A 04/18/2015   Procedure: COLONOSCOPY;  Surgeon: Rogene Houston, MD;  [facility];  Service: Endoscopy;  Laterality: N/A;  830 - moved to 8:55 - Ann to notify pt   COLONOSCOPY WITH ESOPHAGOGASTRODUODENOSCOPY (EGD)  02/16/2012   Procedure: COLONOSCOPY WITH ESOPHAGOGASTRODUODENOSCOPY (EGD);  Surgeon: Daneil Dolin, MD;  [facility];  Service: Endoscopy;  Laterality: N/A;  8:45   COLONOSCOPY WITH PROPOFOL N/A 09/11/2020   Procedure: COLONOSCOPY WITH PROPOFOL;  Surgeon: Rogene Houston, MD;  [facility];  Service: Endoscopy;  Laterality: N/A;  10:55   ESOPHAGEAL DILATION N/A 06/07/2014   Procedure: ESOPHAGEAL DILATION;  Surgeon: Rogene Houston, MD;  [facility];  Service: Endoscopy;  Laterality: N/A;   ESOPHAGEAL DILATION N/A 09/11/2020   Procedure: ESOPHAGEAL DILATION;  Surgeon: Rogene Houston, MD;  [facility];  Service: Endoscopy;  Laterality: N/A;   ESOPHAGOGASTRODUODENOSCOPY  01/2007   Dr Sharlett Iles- 3 cm hiatal hernia, benign esophageal biopsies, erosive esophagitis, gastritis   ESOPHAGOGASTRODUODENOSCOPY N/A 06/07/2014   Procedure: ESOPHAGOGASTRODUODENOSCOPY (EGD);  Surgeon: Rogene Houston, MD;  [facility];  Service: Endoscopy;  Laterality: N/A;  1200   ESOPHAGOGASTRODUODENOSCOPY N/A 08/18/2017   Procedure: ESOPHAGOGASTRODUODENOSCOPY (EGD);  Surgeon: Rogene Houston, MD;  [facility];  Service: Endoscopy;  Laterality: N/A;  2:20   ESOPHAGOGASTRODUODENOSCOPY (EGD) WITH PROPOFOL N/A 09/11/2020   Procedure: ESOPHAGOGASTRODUODENOSCOPY (EGD) WITH PROPOFOL;  Surgeon: Rogene Houston, MD;  [facility];  Service: Endoscopy;  Laterality: N/A;   FOOT SURGERY     INNER EAR SURGERY     Left   KNEE SURGERY     Multiple   POLYPECTOMY  09/11/2020   Procedure: POLYPECTOMY;  Surgeon: Rogene Houston, MD;  [facility];  Service: Endoscopy;;   SHOULDER SURGERY  2011   TONSILLECTOMY     TOTAL KNEE ARTHROPLASTY Left 02/07/2018   Procedure: TOTAL KNEE ARTHROPLASTY;  Surgeon: Gaynelle Arabian, MD;  Location: [facility];  Service: Orthopedics;  Laterality: Left;  100min   TUBAL LIGATION     TYMPANOSTOMY TUBE PLACEMENT     Family History  Problem Relation Age of Onset   Diabetes Father    Heart attack Father        62's   Lung cancer Father    Cirrhosis Father 17       etoh cirrhosis   Eczema Father    Lung cancer Sister    Depression Sister    Alzheimer's disease Sister    Hypertension Sister    Diabetes Sister    Lung cancer Sister    Paranoid behavior Mother        depression   Colon polyps Mother    Eczema Mother    Depression Mother    COPD Daughter    Depression Maternal Aunt     Drug abuse Niece    Suicidality Niece    Allergic rhinitis Neg Hx    Asthma Neg Hx    Urticaria Neg Hx    Social History   Socioeconomic History   Marital status: Married    Spouse name: Herbie [place]   Number of children: 2   Years of education: Not on file   Highest education level: Not on file  Occupational History   Occupation: Retired; Media planner  Tobacco Use   Smoking status: Never   Smokeless tobacco: Never  Vaping Use   Vaping Use: Never used  Substance and Sexual Activity   Alcohol use: Yes    Alcohol/week: 0.0 standard drinks    Comment: per patient very seldom   Drug use: No   Sexual activity: Not Currently    Partners: Male    Comment: Hysterectomy  Other Topics Concern   Not on file  Social History Narrative   Married 2 grown children, 1 adopted son-autistic-lives next door.   1 granddaughter   2 step grandsons   Social Determinants of Radio broadcast assistant Strain: Low Risk    Difficulty of Paying Living Expenses: Not very hard  Food Insecurity: No Food Insecurity   Worried About Charity fundraiser in the Last Year: Never true   Arboriculturist in the Last Year: Never true  Transportation Needs: No Transportation Needs   Lack of Transportation (Medical): No   Lack of Transportation (Non-Medical): No  Physical Activity: Insufficiently Active   Days of Exercise per Week: 3 days   Minutes of Exercise per Session: 10 min  Stress: Stress Concern Present   Feeling of Stress : Rather much  Social Connections: Socially Integrated   Frequency of Communication with Friends and Family: More than three times a week   Frequency of Social Gatherings with Friends and Family: More than three times a week   Attends Religious Services: More than 4 times per year   Active Member of [employer] or Organizations: Yes   Attends Music therapist: More than 4 times per year   Marital Status: Married    Tobacco Counseling Counseling given: Not  Answered   Clinical Intake:  Pre-visit preparation completed: Yes  Pain : 0-10 Pain Score: 8  Pain Type: Chronic pain Pain Location: Knee  Pain Descriptors / Indicators: Aching Pain Onset: More than a month ago Pain Frequency: Intermittent     BMI - recorded: 34.78 Nutritional Status: BMI > 30  Obese Nutritional Risks: None Diabetes: No  How often do you need to have someone help you when you read instructions, pamphlets, or other written materials from your doctor or pharmacy?: 1 - Never  Diabetic?NO  Interpreter Needed?: No  Information entered by :: MJ Hena Ewalt, LPN   Activities of Daily Living In your present state of health, do you have any difficulty performing the following activities: 02/25/2021 09/09/2020  Hearing? Y N  Comment No hearing aid, some hearing loss to L ear. -  Vision? N N  Difficulty concentrating or making decisions? N N  Walking or climbing stairs? N N  Dressing or bathing? N N  Doing errands, shopping? N N  Preparing Food and eating ? N -  Using the Toilet? N -  In the past six months, have you accidently leaked urine? Y -  Comment At times. See urologist. -  Do you have problems with loss of bowel control? N -  Managing your Medications? N -  Managing your Finances? N -  Housekeeping or managing your Housekeeping? N -  Some recent data might be hidden    Patient Care Team: Kathyrn Drown, MD as PCP - General (Family Medicine) Gala Romney, Cristopher Estimable, MD (Gastroenterology)  Indicate any recent Medical Services you may have received from other than [facility] providers in the past year (date may be approximate).     Assessment:   This is a routine wellness examination for Ruth [name].  Hearing/Vision screen Hearing Screening - Comments:: Some hearing issues.  Vision Screening - Comments:: Glasses. Dr. Katy Fitch. 2022.  Dietary issues and exercise activities discussed: Current Exercise Habits: Home exercise routine, Type of exercise: stretching (Glider  exercise machine.), Time (Minutes): 15, Frequency (Times/Week): 3, Weekly Exercise (Minutes/Week): 45, Intensity: Mild, Exercise limited by: cardiac condition(s);orthopedic condition(s)   Goals Addressed             This Visit's Progress    Have 3 meals a day       Pt would like to eat healthier.        Depression Screen PHQ 2/9 Scores 02/25/2021 10/29/2020 05/12/2020 02/21/2020 01/25/2020 02/27/2019 04/07/2018  PHQ - 2 Score 0 3 4 3 4 3 4   PHQ- 9 Score - 16 15 19 14 12 14   Some encounter information is confidential and restricted. Go to Review Flowsheets activity to see all data.    Fall Risk Fall Risk  02/25/2021 05/10/2020 01/25/2020 01/25/2020 05/16/2019  Falls in the past year? 1 1 0 0 1  Number falls in past yr: 0 0 0 0 0  Injury with Fall? 0 0 0 0 0  Risk for fall due to : Impaired balance/gait;Impaired mobility History of fall(s) - - -  Follow up Falls prevention discussed Falls evaluation completed;Education provided Falls evaluation completed Falls evaluation completed -    FALL RISK PREVENTION PERTAINING TO THE HOME:  Any stairs in or around the home? Yes  If so, are there any without handrails? No  Home free of loose throw rugs in walkways, pet beds, electrical cords, etc? Yes  Adequate lighting in your home to reduce risk of falls? Yes   ASSISTIVE DEVICES UTILIZED TO PREVENT FALLS:  Life alert? No  Use of a cane, walker or w/c? Yes  Grab bars in the bathroom? Yes  Shower chair or bench  in shower? Yes  Elevated toilet seat or a handicapped toilet? Yes   TIMED UP AND GO:  Was the test performed? No .  Phone visit.  Cognitive Function:     6CIT Screen 02/25/2021  What Year? 0 points  What month? 0 points  What time? 0 points  Count back from 20 0 points  Months in reverse 0 points  Repeat phrase 0 points  Total Score 0    Immunizations Immunization History  Administered Date(s) Administered   Influenza,inj,Quad PF,6+ Mos 12/15/2017, 11/15/2018    Influenza,inj,quad, With Preservative 11/16/2016   Influenza-Unspecified 11/22/2015, 11/01/2019, 11/30/2020   Moderna Sars-Covid-2 Vaccination 03/24/2019, 04/22/2019, 11/18/2019, 06/02/2020   Pneumococcal Conjugate-13 02/15/2014   Pneumococcal Polysaccharide-23 01/25/2020   Zoster Recombinat (Shingrix) 07/10/2016    TDAP status: Due, Education has been provided regarding the importance of this vaccine. Advised may receive this vaccine at local pharmacy or Health Dept. Aware to provide a copy of the vaccination record if obtained from local pharmacy or Health Dept. Verbalized acceptance and understanding.  Flu Vaccine status: Up to date  Pneumococcal vaccine status: Up to date  Covid-19 vaccine status: Completed vaccines  Qualifies for Shingles Vaccine? Yes   Zostavax completed Yes   Shingrix Completed?: No.    Education has been provided regarding the importance of this vaccine. Patient has been advised to call insurance company to determine out of pocket expense if they have not yet received this vaccine. Advised may also receive vaccine at local pharmacy or Health Dept. Verbalized acceptance and understanding.  Screening Tests Health Maintenance  Topic Date Due   Hepatitis C Screening  Never done   TETANUS/TDAP  Never done   Zoster Vaccines- Shingrix (2 of 2) 09/04/2016   COVID-19 Vaccine (5 - Booster for Moderna series) 07/28/2020   Pneumonia Vaccine 34+ Years old  Completed   INFLUENZA VACCINE  Completed   DEXA SCAN  Completed   HPV VACCINES  Aged Out   COLONOSCOPY (Pts 45-65yrs Insurance coverage will need to be confirmed)  Discontinued    Health Maintenance  Health Maintenance Due  Topic Date Due   Hepatitis C Screening  Never done   TETANUS/TDAP  Never done   Zoster Vaccines- Shingrix (2 of 2) 09/04/2016   COVID-19 Vaccine (5 - Booster for Moderna series) 07/28/2020    Colorectal cancer screening: Type of screening: Colonoscopy. Completed 09/11/2020. Repeat every  repeat no longer required years  Mammogram status: Completed 05/17/2017. Repeat every year  Bone Density status: Completed 09/01/2016. Results reflect: Bone density results: OSTEOPENIA. Repeat every 2 years.  Lung Cancer Screening: (Low Dose CT Chest recommended if Age 43-80 years, 30 pack-year currently smoking OR have quit w/in 15years.) does not qualify.    Additional Screening:  Hepatitis C Screening: does not qualify;   Vision Screening: Recommended annual ophthalmology exams for early detection of glaucoma and other disorders of the eye. Is the patient up to date with their annual eye exam?  Yes  Who is the provider or what is the name of the office in which the patient attends annual eye exams? Dr. Katy Fitch If pt is not established with a provider, would they like to be referred to a provider to establish care? No .   Dental Screening: Recommended annual dental exams for proper oral hygiene  Community Resource Referral / Chronic Care Management: CRR required this visit?  No   CCM required this visit?  No      Plan:     I have  personally reviewed and noted the following in the patients chart:   Medical and social history Use of alcohol, tobacco or illicit drugs  Current medications and supplements including opioid prescriptions.  Functional ability and status Nutritional status Physical activity Advanced directives List of other physicians Hospitalizations, surgeries, and ER visits in previous 12 months Vitals Screenings to include cognitive, depression, and falls Referrals and appointments  In addition, I have reviewed and discussed with patient certain preventive protocols, quality metrics, and best practice recommendations. A written personalized care plan for preventive services as well as general preventive health recommendations were provided to patient.     Chriss Driver, LPN   [id number]   Nurse Notes: Pt is up to date on age appropriate health  maintenance. Pt declined repeat Bone Density at this time. Discussed 2nd shingrix and how to obtain.

## 2021-02-25 NOTE — Telephone Encounter (Signed)
In completing pt's AWV. Pt asks if it is possible for her to have a prescription for a shower chair, sent to [facility]. Pt states she is unsteady due to the arthritis in her knees. Thank you.

## 2021-02-25 NOTE — Telephone Encounter (Signed)
Prescription faxed to [facility] ?

## 2021-02-25 NOTE — Telephone Encounter (Signed)
Patient informed. 

## 2021-02-26 ENCOUNTER — Ambulatory Visit: Payer: Medicare HMO | Admitting: Nurse Practitioner

## 2021-03-03 NOTE — Progress Notes (Addendum)
COVID swab appointment: n/a  COVID Vaccine Completed: yes x4 Date COVID Vaccine completed: 03/24/19, 04/22/19 Has received booster: 11/18/19, 06/02/20 COVID vaccine manufacturer: [employer]   Date of COVID positive in last 90 days: no  PCP - Sallee Lange, MD Cardiologist - n/a  Chest x-ray - n/a EKG - 03/04/21 Epic/chart Stress Test - 2018 ECHO - 2018 Cardiac Cath - 2001, no stents Pacemaker/ICD device last checked: n/a Spinal Cord Stimulator: n/a  Sleep Study - yes, positive CPAP - never given machine  Fasting Blood Sugar - around 100 Checks Blood Sugar __ every 2 or 3 months  Blood Thinner Instructions: n/a Aspirin Instructions: Last Dose:  Activity level: Can go up a flight of stairs and perform activities of daily living without stopping and without symptoms of chest pain. SOB with activity such as walking and cleaning      Anesthesia review: SOB, laryngospasm concerns, PONV, OSA, anemia, preDM,   Patient denies shortness of breath, fever, cough and chest pain at PAT appointment   Patient verbalized understanding of instructions that were given to them at the PAT appointment. Patient was also instructed that they will need to review over the PAT instructions again at home before surgery.

## 2021-03-03 NOTE — Progress Notes (Signed)
Please place orders for PAT appointment scheduled 03/04/21.

## 2021-03-03 NOTE — Patient Instructions (Addendum)
DUE TO COVID-19 ONLY ONE VISITOR IS ALLOWED TO COME WITH YOU AND STAY IN THE WAITING ROOM ONLY DURING PRE OP AND PROCEDURE.   **NO VISITORS ARE ALLOWED IN THE SHORT STAY AREA OR RECOVERY ROOM!!**       Your procedure is scheduled on: 03/12/21   Report to [facility] Main Entrance    Report to admitting at 8:00 AM   Call this number if you have problems the morning of surgery [phone]   Do not eat food :After Midnight.   May have liquids until 7:20 AM day of surgery  CLEAR LIQUID DIET  Foods Allowed                                                                     Foods Excluded  Water, Black Coffee and tea (no milk or creamer)           liquids that you cannot  Plain Jell-O in any flavor  (No red)                                    see through such as: Fruit ices (not with fruit pulp)                                            milk, soups, orange juice              Iced Popsicles (No red)                                               All solid food                                   Apple juices Sports drinks like Gatorade (No red) Lightly seasoned clear broth or consume(fat free) Sugar   Oral Hygiene is also important to reduce your risk of infection.                                    Remember - BRUSH YOUR TEETH THE MORNING OF SURGERY WITH YOUR REGULAR TOOTHPASTE   Stop all vitamins and supplements 7 days before surgery.   Take these medicines the morning of surgery with A SIP OF WATER: Tylenol, Inhalers, Pepcid, Prozac, Valium  DO NOT TAKE ANY ORAL DIABETIC MEDICATIONS DAY OF YOUR SURGERY  How to Manage Your Diabetes Before and After Surgery  Why is it important to control my blood sugar before and after surgery? Improving blood sugar levels before and after surgery helps healing and can limit problems. A way of improving blood sugar control is eating a healthy diet by:  Eating less sugar and carbohydrates  Increasing activity/exercise  Talking with your  doctor about reaching your blood sugar goals High blood sugars (greater than 180  mg/dL) can raise your risk of infections and slow your recovery, so you will need to focus on controlling your diabetes during the weeks before surgery. Make sure that the doctor who takes care of your diabetes knows about your planned surgery including the date and location.  How do I manage my blood sugar before surgery? Check your blood sugar at least 4 times a day, starting 2 days before surgery, to make sure that the level is not too high or low. Check your blood sugar the morning of your surgery when you wake up and every 2 hours until you get to the Short Stay unit. If your blood sugar is less than 70 mg/dL, you will need to treat for low blood sugar: Do not take insulin. Treat a low blood sugar (less than 70 mg/dL) with  cup of clear juice (cranberry or apple), 4 glucose tablets, OR glucose gel. Recheck blood sugar in 15 minutes after treatment (to make sure it is greater than 70 mg/dL). If your blood sugar is not greater than 70 mg/dL on recheck, call [phone] for further instructions. Report your blood sugar to the short stay nurse when you get to Short Stay.  If you are admitted to the hospital after surgery: Your blood sugar will be checked by the staff and you will probably be given insulin after surgery (instead of oral diabetes medicines) to make sure you have good blood sugar levels. The goal for blood sugar control after surgery is 80-180 mg/dL.                              You may not have any metal on your body including hair pins, jewelry, and body piercing             Do not wear make-up, lotions, powders, perfumes, or deodorant  Do not wear nail polish including gel and S&S, artificial/acrylic nails, or any other type of covering on natural nails including finger and toenails. If you have artificial nails, gel coating, etc. that needs to be removed by a nail salon please have this removed  prior to surgery or surgery may need to be canceled/ delayed if the surgeon/ anesthesia feels like they are unable to be safely monitored.   Do not shave  48 hours prior to surgery.    Do not bring valuables to the hospital. [place].    Patients discharged on the day of surgery will not be allowed to drive home.  Someone needs to stay with you for the first 24 hours after anesthesia.   Special Instructions: Bring a copy of your healthcare power of attorney and living will documents         the day of surgery if you haven't scanned them before.              Please read over the following fact sheets you were given: IF YOU HAVE QUESTIONS ABOUT YOUR PRE-OP INSTRUCTIONS PLEASE CALL [facility] - Preparing for Surgery Before surgery, you can play an important role.  Because skin is not sterile, your skin needs to be as free of germs as possible.  You can reduce the number of germs on your skin by washing with CHG (chlorahexidine gluconate) soap before surgery.  CHG is an antiseptic cleaner which kills  germs and bonds with the skin to continue killing germs even after washing. Please DO NOT use if you have an allergy to CHG or antibacterial soaps.  If your skin becomes reddened/irritated stop using the CHG and inform your nurse when you arrive at Short Stay. Do not shave (including legs and underarms) for at least 48 hours prior to the first CHG shower.  You may shave your face/neck.  Please follow these instructions carefully:  1.  Shower with CHG Soap the night before surgery and the  morning of surgery.  2.  If you choose to wash your hair, wash your hair first as usual with your normal  shampoo.  3.  After you shampoo, rinse your hair and body thoroughly to remove the shampoo.                             4.  Use CHG as you would any other liquid soap.  You can apply chg directly to the skin and wash.  Gently with a  scrungie or clean washcloth.  5.  Apply the CHG Soap to your body ONLY FROM THE NECK DOWN.   Do   not use on face/ open                           Wound or open sores. Avoid contact with eyes, ears mouth and   genitals (private parts).                       Wash face,  Genitals (private parts) with your normal soap.             6.  Wash thoroughly, paying special attention to the area where your    surgery  will be performed.  7.  Thoroughly rinse your body with warm water from the neck down.  8.  DO NOT shower/wash with your normal soap after using and rinsing off the CHG Soap.                9.  Pat yourself dry with a clean towel.            10.  Wear clean pajamas.            11.  Place clean sheets on your bed the night of your first shower and do not  sleep with pets. Day of Surgery : Do not apply any lotions/deodorants the morning of surgery.  Please wear clean clothes to the hospital/surgery center.  FAILURE TO FOLLOW THESE INSTRUCTIONS MAY RESULT IN THE CANCELLATION OF YOUR SURGERY  PATIENT SIGNATURE_________________________________  NURSE SIGNATURE__________________________________  ________________________________________________________________________

## 2021-03-04 ENCOUNTER — Encounter (HOSPITAL_COMMUNITY)
Admission: RE | Admit: 2021-03-04 | Discharge: 2021-03-04 | Disposition: A | Discharge status: Home / Self Care | Payer: Medicare HMO | Source: Ambulatory Visit | Attending: Specialist | Admitting: Specialist

## 2021-03-04 ENCOUNTER — Encounter (HOSPITAL_COMMUNITY): Payer: Self-pay

## 2021-03-04 ENCOUNTER — Other Ambulatory Visit: Payer: Self-pay

## 2021-03-04 VITALS — BP 147/76 | HR 73 | Temp 98.3°F | Resp 18 | Ht 65.0 in

## 2021-03-04 DIAGNOSIS — M75101 Unspecified rotator cuff tear or rupture of right shoulder, not specified as traumatic: Secondary | ICD-10-CM | POA: Insufficient documentation

## 2021-03-04 DIAGNOSIS — Z01818 Encounter for other preprocedural examination: Secondary | ICD-10-CM | POA: Diagnosis not present

## 2021-03-04 DIAGNOSIS — J45909 Unspecified asthma, uncomplicated: Secondary | ICD-10-CM | POA: Diagnosis not present

## 2021-03-04 DIAGNOSIS — K219 Gastro-esophageal reflux disease without esophagitis: Secondary | ICD-10-CM | POA: Diagnosis not present

## 2021-03-04 DIAGNOSIS — M19011 Primary osteoarthritis, right shoulder: Secondary | ICD-10-CM | POA: Insufficient documentation

## 2021-03-04 DIAGNOSIS — F411 Generalized anxiety disorder: Secondary | ICD-10-CM | POA: Diagnosis not present

## 2021-03-04 DIAGNOSIS — R072 Precordial pain: Secondary | ICD-10-CM | POA: Diagnosis not present

## 2021-03-04 DIAGNOSIS — G4733 Obstructive sleep apnea (adult) (pediatric): Secondary | ICD-10-CM | POA: Diagnosis not present

## 2021-03-04 DIAGNOSIS — F331 Major depressive disorder, recurrent, moderate: Secondary | ICD-10-CM | POA: Diagnosis not present

## 2021-03-04 DIAGNOSIS — J385 Laryngeal spasm: Secondary | ICD-10-CM | POA: Diagnosis not present

## 2021-03-04 DIAGNOSIS — R7303 Prediabetes: Secondary | ICD-10-CM | POA: Diagnosis not present

## 2021-03-04 HISTORY — DX: Personal history of urinary calculi: Z87.442

## 2021-03-04 HISTORY — DX: Prediabetes: R73.03

## 2021-03-04 HISTORY — DX: Cyst of kidney, acquired: N28.1

## 2021-03-04 HISTORY — DX: Anemia, unspecified: D64.9

## 2021-03-04 HISTORY — DX: Fatty (change of) liver, not elsewhere classified: K76.0

## 2021-03-04 HISTORY — DX: Pneumonia, unspecified organism: J18.9

## 2021-03-04 LAB — BASIC METABOLIC PANEL
Anion gap: 6 (ref 5–15)
BUN: 16 mg/dL (ref 8–23)
CO2: 27 mmol/L (ref 22–32)
Calcium: 9.3 mg/dL (ref 8.9–10.3)
Chloride: 104 mmol/L (ref 98–111)
Creatinine, Ser: 0.59 mg/dL (ref 0.44–1.00)
GFR, Estimated: >60 mL/min (ref >60)
Glucose, Bld: 102 mg/dL — ABNORMAL HIGH (ref 70–99)
Potassium: 4.5 mmol/L (ref 3.5–5.1)
Sodium: 137 mmol/L (ref 135–145)

## 2021-03-04 LAB — CBC
HCT: 40.6 % (ref 36.0–46.0)
Hemoglobin: 13.3 g/dL (ref 12.0–15.0)
MCH: 31.4 pg (ref 26.0–34.0)
MCHC: 32.8 g/dL (ref 30.0–36.0)
MCV: 96 fL (ref 80.0–100.0)
Platelets: 226 10*3/uL (ref 150–400)
RBC: 4.23 MIL/uL (ref 3.87–5.11)
RDW: 13.4 % (ref 11.5–15.5)
WBC: 5 10*3/uL (ref 4.0–10.5)
nRBC: 0 % (ref 0.0–0.2)

## 2021-03-04 LAB — HEMOGLOBIN A1C
Hgb A1c MFr Bld: 5.9 % — ABNORMAL HIGH (ref 4.8–5.6)
Mean Plasma Glucose: 122.63 mg/dL

## 2021-03-04 LAB — NO BLOOD PRODUCTS

## 2021-03-04 LAB — GLUCOSE, CAPILLARY: Glucose-Capillary: 106 mg/dL — ABNORMAL HIGH (ref 70–99)

## 2021-03-05 NOTE — Anesthesia Preprocedure Evaluation (Addendum)
Anesthesia Evaluation    Airway        Dental   Pulmonary           Cardiovascular      Neuro/Psych    GI/Hepatic   Endo/Other    Renal/GU      Musculoskeletal   Abdominal   Peds  Hematology   Anesthesia Other Findings Spoke to pt about her frequent "laryngospams". Per pt, they occur almost daily without any inciting event. She feels "like her chest is tight and she can't breath." She states they last sometimes as long as 5 min. She takes deep breaths to make them go away. She has had multiple anesthetics without complications.   Reproductive/Obstetrics                             Anesthesia Physical Anesthesia Plan  ASA:   Anesthesia Plan:    Post-op Pain Management:    Induction:   PONV Risk Score and Plan:   Airway Management Planned:   Additional Equipment:   Intra-op Plan:   Post-operative Plan:   Informed Consent:   Plan Discussed with:   Anesthesia Plan Comments: (See PAT note 03/04/2021, Konrad Felix Ward, PA-C)        Anesthesia Quick Evaluation

## 2021-03-05 NOTE — Progress Notes (Signed)
Anesthesia Chart Review   Case: [id number] Date/Time: 03/12/21 1006   Procedure: SHOULDER ARTHROSCOPY WITH MANIPULATION UNDER ANESTHESIA ,SUBACROMIAL DECOMPRESSION, DISTAL CLAVICLE RESECTION, ROTATOR CUFF REPAIR , BICEPS TENOTOMY (Right) - WITH INTERSCALENE BLOCK NEEDS PRE OP CONSULT  WITH ANESTHESIA 120   Anesthesia type: General   Pre-op diagnosis: Right shoulder rotator cuff tear,acromioclavicular osteoarthritis, biceps subluxation   Location: [facility] ROOM 08 / [facility]   Surgeons: Sydnee Cabal, MD       DISCUSSION:81 y.o. never smoker with h/o PONV, GERD, OSA, asthma, GAD, right rotator cuff tear scheduled for above procedure 03/12/2021 with Dr. Sydnee Cabal.   Pt last seen by PCP 10/29/2020.   Pt reports h/o laryngospasm during her daily life which resolves without treatment.  No anesthesia complications noted with colonoscopy 09/11/2020 or left total knee 02/07/2018.   VS: BP (!) 147/76    Pulse 73    Temp 36.8 C (Oral)    Resp 18    Ht 5\' 5"  (1.651 m)    SpO2 99%    BMI 34.78 kg/m   PROVIDERS: Kathyrn Drown, MD is PCP    LABS: Labs reviewed: Acceptable for surgery. (all labs ordered are listed, but only abnormal results are displayed)  Labs Reviewed  HEMOGLOBIN A1C - Abnormal; Notable for the following components:      Result Value   Hgb A1c MFr Bld 5.9 (*)    All other components within normal limits  BASIC METABOLIC PANEL - Abnormal; Notable for the following components:   Glucose, Bld 102 (*)    All other components within normal limits  GLUCOSE, CAPILLARY - Abnormal; Notable for the following components:   Glucose-Capillary 106 (*)    All other components within normal limits  CBC  NO BLOOD PRODUCTS     IMAGES:   EKG: 03/04/2021 Rate 72 bpm  NSR LVH No significant change since last tracing  CV: Echo 12/30/2016 - Left ventricle: The cavity size was normal. Wall thickness was    increased in a pattern of mild LVH. Systolic function was normal.    The  estimated ejection fraction was in the range of 60% to 65%.    Wall motion was normal; there were no regional wall motion    abnormalities. Left ventricular diastolic function parameters    were normal.  - Mitral valve: There was trivial regurgitation.  - Right atrium: Central venous pressure (est): 3 mm Hg.  - Atrial septum: No defect or patent foramen ovale was identified.  - Tricuspid valve: There was trivial regurgitation.  - Pulmonary arteries: PA peak pressure: 26 mm Hg (S).  - Pericardium, extracardiac: There was no pericardial effusion.   Myocardial Perfusion 04/17/2016 No diagnostic ST segment changes to indicate ischemia. Small, mild intensity, apical anteroseptal defect is consistent with breast attenuation. No clear evidence of scar or large ischemic territories. Nuclear stress EF: 78%. Past Medical History:  Diagnosis Date   Anemia    Anxiety    Arthritis    Asthma    Depression    Eczema    Fatty liver    Fibromyalgia    GERD (gastroesophageal reflux disease)    EGD/ colon 1/09   H pylori ulcer 1980-1990   s/p treatment   Headache    History of kidney stones    Hyperlipidemia    IBS (irritable bowel syndrome)    Kidney cysts    Laryngospasm    Neck pain    OSA (obstructive sleep apnea)    Pneumonia  PONV (postoperative nausea and vomiting)    Pre-diabetes    Refusal of blood transfusions as patient is Jehovah's Witness    Vertigo     Past Surgical History:  Procedure Laterality Date   ABDOMINAL HYSTERECTOMY  1970   APPENDECTOMY     BILATERAL SALPINGOOPHORECTOMY  1990s   BIOPSY  09/11/2020   Procedure: BIOPSY;  Surgeon: Rogene Houston, MD;  [facility];  Service: Endoscopy;;  gastric   CARDIAC CATHETERIZATION  2002   [facility]) normal coronary arteries    COLONOSCOPY  01/2007   Dr. Meriel Flavors   COLONOSCOPY N/A 04/18/2015   Procedure: COLONOSCOPY;  Surgeon: Rogene Houston, MD;  [facility];  Service: Endoscopy;  Laterality:  N/A;  830 - moved to 8:55 - Ann to notify pt   COLONOSCOPY WITH ESOPHAGOGASTRODUODENOSCOPY (EGD)  02/16/2012   Procedure: COLONOSCOPY WITH ESOPHAGOGASTRODUODENOSCOPY (EGD);  Surgeon: Daneil Dolin, MD;  [facility];  Service: Endoscopy;  Laterality: N/A;  8:45   COLONOSCOPY WITH PROPOFOL N/A 09/11/2020   Procedure: COLONOSCOPY WITH PROPOFOL;  Surgeon: Rogene Houston, MD;  [facility];  Service: Endoscopy;  Laterality: N/A;  10:55   ESOPHAGEAL DILATION N/A 06/07/2014   Procedure: ESOPHAGEAL DILATION;  Surgeon: Rogene Houston, MD;  [facility];  Service: Endoscopy;  Laterality: N/A;   ESOPHAGEAL DILATION N/A 09/11/2020   Procedure: ESOPHAGEAL DILATION;  Surgeon: Rogene Houston, MD;  [facility];  Service: Endoscopy;  Laterality: N/A;   ESOPHAGOGASTRODUODENOSCOPY  01/2007   Dr Sharlett Iles- 3 cm hiatal hernia, benign esophageal biopsies, erosive esophagitis, gastritis   ESOPHAGOGASTRODUODENOSCOPY N/A 06/07/2014   Procedure: ESOPHAGOGASTRODUODENOSCOPY (EGD);  Surgeon: Rogene Houston, MD;  [facility];  Service: Endoscopy;  Laterality: N/A;  1200   ESOPHAGOGASTRODUODENOSCOPY N/A 08/18/2017   Procedure: ESOPHAGOGASTRODUODENOSCOPY (EGD);  Surgeon: Rogene Houston, MD;  [facility];  Service: Endoscopy;  Laterality: N/A;  2:20   ESOPHAGOGASTRODUODENOSCOPY (EGD) WITH PROPOFOL N/A 09/11/2020   Procedure: ESOPHAGOGASTRODUODENOSCOPY (EGD) WITH PROPOFOL;  Surgeon: Rogene Houston, MD;  [facility];  Service: Endoscopy;  Laterality: N/A;   FOOT SURGERY     INNER EAR SURGERY     Left   KNEE SURGERY     Multiple   POLYPECTOMY  09/11/2020   Procedure: POLYPECTOMY;  Surgeon: Rogene Houston, MD;  [facility];  Service: Endoscopy;;   SHOULDER SURGERY  2011   TONSILLECTOMY     TOTAL KNEE ARTHROPLASTY Left 02/07/2018   Procedure: TOTAL KNEE ARTHROPLASTY;  Surgeon: Gaynelle Arabian, MD;  Location: [facility];  Service:  Orthopedics;  Laterality: Left;  30min   TUBAL LIGATION     TYMPANOSTOMY TUBE PLACEMENT      MEDICATIONS:  acetaminophen (TYLENOL) 500 MG tablet   acidophilus (RISAQUAD) CAPS capsule   albuterol (PROVENTIL HFA;VENTOLIN HFA) 108 (90 Base) MCG/ACT inhaler   Ascorbic Acid (VITAMIN C PO)   b complex vitamins capsule   diazepam (VALIUM) 5 MG tablet   dicyclomine (BENTYL) 10 MG capsule   famotidine (PEPCID) 40 MG tablet   FLUoxetine (PROZAC) 20 MG tablet   fluticasone (CUTIVATE) 0.05 % cream   ketoconazole (NIZORAL) 2 % cream   MAGNESIUM PO   Polyvinyl Alcohol (LUBRICANT DROPS OP)   No current facility-administered medications for this encounter.    Konrad Felix Ward, PA-C [facility] [phone]

## 2021-03-12 DIAGNOSIS — M75101 Unspecified rotator cuff tear or rupture of right shoulder, not specified as traumatic: Secondary | ICD-10-CM

## 2021-03-17 ENCOUNTER — Telehealth: Payer: Self-pay | Admitting: Family Medicine

## 2021-03-17 NOTE — Addendum Note (Signed)
Addended by: Madelin Rear on: 03/17/2021 01:48 PM   Modules accepted: Orders

## 2021-03-17 NOTE — Telephone Encounter (Signed)
May have 6 months on medicine.  Follow-up by early spring with me in March or April

## 2021-03-17 NOTE — Telephone Encounter (Signed)
[facility] [address] requesting refill on Fluoxetine 20 mg tablets. Take 2 tablet po daily. Pt last seen 10/29/20 for prediabetes. Please advise. Thank you

## 2021-03-17 NOTE — Telephone Encounter (Signed)
Patient is being weaned off the medication by psychiatrist.

## 2021-04-17 DIAGNOSIS — M75120 Complete rotator cuff tear or rupture of unspecified shoulder, not specified as traumatic: Secondary | ICD-10-CM | POA: Insufficient documentation

## 2021-04-21 DIAGNOSIS — H6123 Impacted cerumen, bilateral: Secondary | ICD-10-CM | POA: Diagnosis not present

## 2021-04-21 DIAGNOSIS — H903 Sensorineural hearing loss, bilateral: Secondary | ICD-10-CM | POA: Diagnosis not present

## 2021-04-21 DIAGNOSIS — H9202 Otalgia, left ear: Secondary | ICD-10-CM | POA: Diagnosis not present

## 2021-04-22 DIAGNOSIS — M75121 Complete rotator cuff tear or rupture of right shoulder, not specified as traumatic: Secondary | ICD-10-CM | POA: Diagnosis not present

## 2021-04-24 NOTE — Patient Instructions (Signed)
DUE TO COVID-19 ONLY ONE VISITOR  (aged 80 and older)  IS ALLOWED TO COME WITH YOU AND STAY IN THE WAITING ROOM ONLY DURING PRE OP AND PROCEDURE.   **NO VISITORS ARE ALLOWED IN THE SHORT STAY AREA OR RECOVERY ROOM!!**  IF YOU WILL BE ADMITTED INTO THE HOSPITAL YOU ARE ALLOWED ONLY TWO SUPPORT PEOPLE DURING VISITATION HOURS ONLY (7 AM -8PM)   The support person(s) must pass our screening, gel in and out, and wear a mask at all times, including in the patients room. Patients must also wear a mask when staff or their support person are in the room. Visitors GUEST BADGE MUST BE WORN VISIBLY  One adult visitor may remain with you overnight and MUST be in the room by 8 P.M.          Your procedure is scheduled on: 05/07/21   Report to [facility] Main Entrance    Report to short stay at 5:15 AM   Call this number if you have problems the morning of surgery [phone]   Do not eat food :After Midnight.   After Midnight you may have the following liquids until _4:30 _____ AM DAY OF SURGERY  Water Black Coffee (sugar ok, NO MILK/CREAM OR CREAMERS)  Tea (sugar ok, NO MILK/CREAM OR CREAMERS) regular and decaf                             Plain Jell-O (NO RED)                                           Fruit ices (not with fruit pulp, NO RED)                                     Popsicles (NO RED)                                                                  Juice: apple, WHITE grape, WHITE cranberry Sports drinks like Gatorade (NO RED) Clear broth(vegetable,chicken,beef)                    The day of surgery:  Drink ONE (1) Pre-Surgery G2 at 4:15 AM the morning of surgery. Drink in one sitting. Do not sip.  This drink was given to you during your hospital  pre-op appointment visit. Nothing else to drink after completing the  G2. At 4:30.          If you have questions, please contact your surgeons office.   FOLLOW BOWEL PREP AND ANY ADDITIONAL PRE OP INSTRUCTIONS YOU  RECEIVED FROM YOUR SURGEON'S OFFICE!!!     Oral Hygiene is also important to reduce your risk of infection.                                    Remember - BRUSH YOUR TEETH THE MORNING OF SURGERY WITH YOUR REGULAR TOOTHPASTE   Do NOT smoke after  Midnight   Take these medicines the morning of surgery with A SIP OF WATER: Pepcid ,Prozac, use your inhaler and bring it with you. Valium only if you need it   Bring CPAP mask and tubing day of surgery.                              You may not have any metal on your body including hair pins, jewelry, and body piercing             Do not wear make-up, lotions, powders, perfumes/cologne, or deodorant  Do not wear nail polish including gel and S&S, artificial/acrylic nails, or any other type of covering on natural nails including finger and toenails. If you have artificial nails, gel coating, etc. that needs to be removed by a nail salon please have this removed prior to surgery or surgery may need to be canceled/ delayed if the surgeon/ anesthesia feels like they are unable to be safely monitored.   Do not shave  48 hours prior to surgery.  .   Do not bring valuables to the hospital. [place].   Contacts, dentures or bridgework may not be worn into surgery.     Patients discharged on the day of surgery will not be allowed to drive home.  Someone NEEDS to stay with you for the first 24 hours after anesthesia.   Special Instructions: Bring a copy of your healthcare power of attorney and living will documents the day of surgery if you haven't scanned them before.              Please read over the following fact sheets you were given: IF YOU HAVE QUESTIONS ABOUT YOUR PRE-OP INSTRUCTIONS PLEASE CALL [facility]- Preparing for Total Shoulder Arthroplasty    Before surgery, you can play an important role. Because skin is not sterile, your skin needs to be as free of germs as possible.  You can reduce the number of germs on your skin by using the following products. Benzoyl Peroxide Gel Reduces the number of germs present on the skin Applied twice a day to shoulder area starting two days before surgery    ==================================================================  Please follow these instructions carefully:  BENZOYL PEROXIDE 5% GEL  Please do not use if you have an allergy to benzoyl peroxide.   If your skin becomes reddened/irritated stop using the benzoyl peroxide.  Starting two days before surgery, apply as follows: Apply benzoyl peroxide in the morning and at night. Apply after taking a shower. If you are not taking a shower clean entire shoulder front, back, and side along with the armpit with a clean wet washcloth.  Place a quarter-sized dollop on your shoulder and rub in thoroughly, making sure to cover the front, back, and side of your shoulder, along with the armpit.   2 days before ____ AM   ____ PM              1 day before ____ AM   ____ PM                         Do this twice a day for two days.  (Last application is the night before surgery, AFTER using the CHG soap as described below).  Do NOT apply  benzoyl peroxide gel on the day of surgery.      [place] - Preparing for Surgery Before surgery, you can play an important role.  Because skin is not sterile, your skin needs to be as free of germs as possible.  You can reduce the number of germs on your skin by washing with CHG (chlorahexidine gluconate) soap before surgery.  CHG is an antiseptic cleaner which kills germs and bonds with the skin to continue killing germs even after washing. Please DO NOT use if you have an allergy to CHG or antibacterial soaps.  If your skin becomes reddened/irritated stop using the CHG and inform your nurse when you arrive at Short Stay. Do not shave (including legs and underarms) for at least 48 hours prior to the first CHG shower.   Please follow these  instructions carefully:  1.  Shower with CHG Soap the night before surgery and the  morning of Surgery.  2.  If you choose to wash your hair, wash your hair first as usual with your  normal  shampoo.  3.  After you shampoo, rinse your hair and body thoroughly to remove the  shampoo.                            4.  Use CHG as you would any other liquid soap.  You can apply chg directly  to the skin and wash                       Gently with a scrungie or clean washcloth.  5.  Apply the CHG Soap to your body ONLY FROM THE NECK DOWN.   Do not use on face/ open                           Wound or open sores. Avoid contact with eyes, ears mouth and genitals (private parts).                       Wash face,  Genitals (private parts) with your normal soap.             6.  Wash thoroughly, paying special attention to the area where your surgery  will be performed.  7.  Thoroughly rinse your body with warm water from the neck down.  8.  DO NOT shower/wash with your normal soap after using and rinsing off  the CHG Soap.                9.  Pat yourself dry with a clean towel.            10.  Wear clean pajamas.            11.  Place clean sheets on your bed the night of your first shower and do not  sleep with pets. Day of Surgery : Do not apply any lotions/deodorants the morning of surgery.  Please wear clean clothes to the hospital/surgery center.  FAILURE TO FOLLOW THESE INSTRUCTIONS MAY RESULT IN THE CANCELLATION OF YOUR SURGERY PATIENT SIGNATURE_________________________________  NURSE SIGNATURE__________________________________  ________________________________________________________________________   Ruth [name]  An incentive spirometer is a tool that can help keep your lungs clear and active. This tool measures how well you are filling your lungs with each breath. Taking long deep breaths may help reverse or decrease the chance of developing breathing (pulmonary)  problems (especially  infection) following: A long period of time when you are unable to move or be active. BEFORE THE PROCEDURE  If the spirometer includes an indicator to show your best effort, your nurse or respiratory therapist will set it to a desired goal. If possible, sit up straight or lean slightly forward. Try not to slouch. Hold the incentive spirometer in an upright position. INSTRUCTIONS FOR USE  Sit on the edge of your bed if possible, or sit up as far as you can in bed or on a chair. Hold the incentive spirometer in an upright position. Breathe out normally. Place the mouthpiece in your mouth and seal your lips tightly around it. Breathe in slowly and as deeply as possible, raising the piston or the ball toward the top of the column. Hold your breath for 3-5 seconds or for as long as possible. Allow the piston or ball to fall to the bottom of the column. Remove the mouthpiece from your mouth and breathe out normally. Rest for a few seconds and repeat Steps 1 through 7 at least 10 times every 1-2 hours when you are awake. Take your time and take a few normal breaths between deep breaths. The spirometer may include an indicator to show your best effort. Use the indicator as a goal to work toward during each repetition. After each set of 10 deep breaths, practice coughing to be sure your lungs are clear. If you have an incision (the cut made at the time of surgery), support your incision when coughing by placing a pillow or rolled up towels firmly against it. Once you are able to get out of bed, walk around indoors and cough well. You may stop using the incentive spirometer when instructed by your caregiver.  RISKS AND COMPLICATIONS Take your time so you do not get dizzy or light-headed. If you are in pain, you may need to take or ask for pain medication before doing incentive spirometry. It is harder to take a deep breath if you are having pain. AFTER USE Rest and breathe slowly and easily. It can be  helpful to keep track of a log of your progress. Your caregiver can provide you with a simple table to help with this. If you are using the spirometer at home, follow these instructions: [place] IF:  You are having difficultly using the spirometer. You have trouble using the spirometer as often as instructed. Your pain medication is not giving enough relief while using the spirometer. You develop fever of 100.5 F (38.1 C) or higher. SEEK IMMEDIATE MEDICAL CARE IF:  You cough up bloody sputum that had not been present before. You develop fever of 102 F (38.9 C) or greater. You develop worsening pain at or near the incision site. MAKE SURE YOU:  Understand these instructions. Will watch your condition. Will get help right away if you are not doing well or get worse. Document Released: 06/15/2006 Document Revised: 04/27/2011 Document Reviewed: 08/16/2006 [facility] Patient Information 2014 [place], [place].   ________________________________________________________________________

## 2021-04-25 ENCOUNTER — Other Ambulatory Visit: Payer: Self-pay

## 2021-04-25 ENCOUNTER — Encounter (HOSPITAL_COMMUNITY): Payer: Self-pay

## 2021-04-25 ENCOUNTER — Encounter (HOSPITAL_COMMUNITY)
Admission: RE | Admit: 2021-04-25 | Discharge: 2021-04-25 | Disposition: A | Discharge status: Home / Self Care | Payer: Medicare HMO | Source: Ambulatory Visit | Attending: Specialist | Admitting: Specialist

## 2021-04-25 VITALS — BP 136/68 | HR 71 | Temp 98.3°F | Resp 18 | Ht 65.0 in | Wt 215.0 lb

## 2021-04-25 DIAGNOSIS — R7303 Prediabetes: Secondary | ICD-10-CM | POA: Diagnosis not present

## 2021-04-25 DIAGNOSIS — M75101 Unspecified rotator cuff tear or rupture of right shoulder, not specified as traumatic: Secondary | ICD-10-CM | POA: Diagnosis not present

## 2021-04-25 DIAGNOSIS — Z01812 Encounter for preprocedural laboratory examination: Secondary | ICD-10-CM | POA: Insufficient documentation

## 2021-04-25 LAB — COMPREHENSIVE METABOLIC PANEL
ALT: 16 U/L (ref 0–44)
AST: 15 U/L (ref 15–41)
Albumin: 4.2 g/dL (ref 3.5–5.0)
Alkaline Phosphatase: 66 U/L (ref 38–126)
Anion gap: 9 (ref 5–15)
BUN: 19 mg/dL (ref 8–23)
CO2: 25 mmol/L (ref 22–32)
Calcium: 9.3 mg/dL (ref 8.9–10.3)
Chloride: 102 mmol/L (ref 98–111)
Creatinine, Ser: 0.74 mg/dL (ref 0.44–1.00)
GFR, Estimated: >60 mL/min (ref >60)
Glucose, Bld: 102 mg/dL — ABNORMAL HIGH (ref 70–99)
Potassium: 4.5 mmol/L (ref 3.5–5.1)
Sodium: 136 mmol/L (ref 135–145)
Total Bilirubin: 0.3 mg/dL (ref 0.3–1.2)
Total Protein: 7 g/dL (ref 6.5–8.1)

## 2021-04-25 LAB — CBC
HCT: 39.9 % (ref 36.0–46.0)
Hemoglobin: 13 g/dL (ref 12.0–15.0)
MCH: 31.7 pg (ref 26.0–34.0)
MCHC: 32.6 g/dL (ref 30.0–36.0)
MCV: 97.3 fL (ref 80.0–100.0)
Platelets: 232 10*3/uL (ref 150–400)
RBC: 4.1 MIL/uL (ref 3.87–5.11)
RDW: 13.2 % (ref 11.5–15.5)
WBC: 5.1 10*3/uL (ref 4.0–10.5)
nRBC: 0 % (ref 0.0–0.2)

## 2021-04-25 LAB — NO BLOOD PRODUCTS

## 2021-04-25 LAB — GLUCOSE, CAPILLARY: Glucose-Capillary: 105 mg/dL — ABNORMAL HIGH (ref 70–99)

## 2021-04-25 NOTE — Progress Notes (Signed)
COVID test-NA ? ? ?Bowel prep reminder:NA ? ?PCP - Dr. Lance Sell ?Cardiologist - none ? ?Chest x-ray - no ?EKG - 03/04/21-chart ?Stress Test - 2018-epic ?ECHO - 12/30/2016-epic ?Cardiac Cath - 2002 ?Pacemaker/ICD device last checked:NA ? ?Sleep Study - yes- didn't sleep and never repeated the test ?CPAP - no ? ?Fasting Blood Sugar - NA ?Checks Blood Sugar _____ times a day ? ?Blood Thinner Instructions:NA ?Aspirin Instructions: ?Last Dose: ? ?Anesthesia review: yes ? ?Patient denies shortness of breath, fever, cough and chest pain at PAT appointment ?Pt reports that she does get SOB with exertion. She also has had several laryngospasms in the past and reacts to pain medications with aggression. She was told to talk to an Anesthesiologist prior to surgery. Dr. Lanetta Inch came to PST to talk with the Pt and her husband. All questions were answered. ? ?Patient verbalized understanding of instructions that were given to them at the PAT appointment. Patient was also instructed that they will need to review over the PAT instructions again at home before surgery. yes ?

## 2021-04-26 LAB — HEMOGLOBIN A1C
Hgb A1c MFr Bld: 5.8 % — ABNORMAL HIGH (ref 4.8–5.6)
Mean Plasma Glucose: 120 mg/dL

## 2021-04-30 ENCOUNTER — Encounter: Payer: Self-pay | Admitting: Family Medicine

## 2021-05-02 NOTE — Telephone Encounter (Signed)
Nurses ?Certainly understand patient's concerns ?At the same time she should be able to go through surgery okay without major difficulty.  The likelihood of laryngeal spasm is low.  We would be happy to do a sitdown consultation with her.  My schedule is somewhat limited this week and next week because of time off but could see her within the next couple weeks. ?

## 2021-05-07 ENCOUNTER — Other Ambulatory Visit: Payer: Self-pay

## 2021-05-07 ENCOUNTER — Encounter (HOSPITAL_COMMUNITY)
Admission: RE | Disposition: A | Discharge status: Home / Self Care | Payer: Self-pay | Source: Ambulatory Visit | Attending: Specialist

## 2021-05-07 ENCOUNTER — Ambulatory Visit (HOSPITAL_COMMUNITY)
Admission: RE | Admit: 2021-05-07 | Discharge: 2021-05-07 | Disposition: A | Discharge status: Home / Self Care | Payer: Medicare HMO | Source: Ambulatory Visit | Attending: Specialist | Admitting: Specialist

## 2021-05-07 ENCOUNTER — Ambulatory Visit (HOSPITAL_COMMUNITY): Payer: Medicare HMO | Admitting: Physician Assistant

## 2021-05-07 ENCOUNTER — Ambulatory Visit (HOSPITAL_BASED_OUTPATIENT_CLINIC_OR_DEPARTMENT_OTHER): Payer: Medicare HMO | Admitting: Anesthesiology

## 2021-05-07 DIAGNOSIS — F419 Anxiety disorder, unspecified: Secondary | ICD-10-CM | POA: Insufficient documentation

## 2021-05-07 DIAGNOSIS — M797 Fibromyalgia: Secondary | ICD-10-CM | POA: Diagnosis not present

## 2021-05-07 DIAGNOSIS — M75111 Incomplete rotator cuff tear or rupture of right shoulder, not specified as traumatic: Secondary | ICD-10-CM | POA: Diagnosis not present

## 2021-05-07 DIAGNOSIS — M7541 Impingement syndrome of right shoulder: Secondary | ICD-10-CM | POA: Diagnosis not present

## 2021-05-07 DIAGNOSIS — M94211 Chondromalacia, right shoulder: Secondary | ICD-10-CM | POA: Diagnosis not present

## 2021-05-07 DIAGNOSIS — M19011 Primary osteoarthritis, right shoulder: Secondary | ICD-10-CM | POA: Diagnosis not present

## 2021-05-07 DIAGNOSIS — M75101 Unspecified rotator cuff tear or rupture of right shoulder, not specified as traumatic: Secondary | ICD-10-CM | POA: Insufficient documentation

## 2021-05-07 DIAGNOSIS — K219 Gastro-esophageal reflux disease without esophagitis: Secondary | ICD-10-CM | POA: Diagnosis not present

## 2021-05-07 DIAGNOSIS — S43491A Other sprain of right shoulder joint, initial encounter: Secondary | ICD-10-CM | POA: Diagnosis not present

## 2021-05-07 DIAGNOSIS — G8918 Other acute postprocedural pain: Secondary | ICD-10-CM | POA: Diagnosis not present

## 2021-05-07 DIAGNOSIS — S43431A Superior glenoid labrum lesion of right shoulder, initial encounter: Secondary | ICD-10-CM | POA: Diagnosis not present

## 2021-05-07 DIAGNOSIS — S46211A Strain of muscle, fascia and tendon of other parts of biceps, right arm, initial encounter: Secondary | ICD-10-CM | POA: Diagnosis not present

## 2021-05-07 DIAGNOSIS — M7521 Bicipital tendinitis, right shoulder: Secondary | ICD-10-CM | POA: Diagnosis not present

## 2021-05-07 HISTORY — PX: SHOULDER ARTHROSCOPY WITH ROTATOR CUFF REPAIR: SHX5685

## 2021-05-07 LAB — GLUCOSE, CAPILLARY
Glucose-Capillary: 101 mg/dL — ABNORMAL HIGH (ref 70–99)
Glucose-Capillary: 105 mg/dL — ABNORMAL HIGH (ref 70–99)

## 2021-05-07 SURGERY — ARTHROSCOPY, SHOULDER, WITH ROTATOR CUFF REPAIR
Anesthesia: Regional | Laterality: Right

## 2021-05-07 MED ORDER — METHOCARBAMOL 500 MG PO TABS: 500.0000 mg | ORAL_TABLET | Freq: Four times a day (QID) | ORAL | 0 refills | Status: DC

## 2021-05-07 MED ORDER — LACTATED RINGERS IV SOLN: INTRAVENOUS | Status: DC

## 2021-05-07 MED ORDER — OXYCODONE HCL 5 MG PO TABS: 5.0000 mg | ORAL_TABLET | ORAL | 0 refills | Status: DC | PRN

## 2021-05-07 MED ORDER — MIDAZOLAM HCL 2 MG/2ML IJ SOLN
INTRAMUSCULAR | Status: AC
Start: 2021-05-07 — End: ?
  Filled 2021-05-07: qty 2

## 2021-05-07 MED ORDER — LIDOCAINE HCL (PF) 2 % IJ SOLN
INTRAMUSCULAR | Status: AC
  Filled 2021-05-07: qty 5

## 2021-05-07 MED ORDER — ONDANSETRON HCL 4 MG/2ML IJ SOLN
INTRAMUSCULAR | Status: DC | PRN
  Administered 2021-05-07: 4 mg via INTRAVENOUS

## 2021-05-07 MED ORDER — 0.9 % SODIUM CHLORIDE (POUR BTL) OPTIME
TOPICAL | Status: DC | PRN
Start: 2021-05-07 — End: 2021-05-07
  Administered 2021-05-07: 1000 mL

## 2021-05-07 MED ORDER — CEPHALEXIN 500 MG PO CAPS
500.0000 mg | ORAL_CAPSULE | Freq: Four times a day (QID) | ORAL | 0 refills | Status: AC
Start: 2021-05-07 — End: 2021-05-10

## 2021-05-07 MED ORDER — FENTANYL CITRATE (PF) 100 MCG/2ML IJ SOLN
INTRAMUSCULAR | Status: DC | PRN
  Administered 2021-05-07 (×2): 50 ug via INTRAVENOUS

## 2021-05-07 MED ORDER — ROCURONIUM BROMIDE 10 MG/ML (PF) SYRINGE
PREFILLED_SYRINGE | INTRAVENOUS | Status: DC | PRN
  Administered 2021-05-07: 40 mg via INTRAVENOUS

## 2021-05-07 MED ORDER — ONDANSETRON HCL 4 MG/2ML IJ SOLN: 4.0000 mg | Freq: Once | INTRAMUSCULAR | Status: DC | PRN

## 2021-05-07 MED ORDER — ONDANSETRON HCL 4 MG PO TABS: 4.0000 mg | ORAL_TABLET | Freq: Every day | ORAL | 1 refills | Status: DC | PRN

## 2021-05-07 MED ORDER — BUPIVACAINE LIPOSOME 1.3 % IJ SUSP
INTRAMUSCULAR | Status: DC | PRN
  Administered 2021-05-07: 10 mL via PERINEURAL

## 2021-05-07 MED ORDER — SODIUM CHLORIDE 0.9 % IR SOLN
Status: DC | PRN
  Administered 2021-05-07: 6000 mL

## 2021-05-07 MED ORDER — BUPIVACAINE HCL 0.25 % IJ SOLN
INTRAMUSCULAR | Status: AC
  Filled 2021-05-07: qty 1

## 2021-05-07 MED ORDER — SODIUM CHLORIDE 0.9% FLUSH
INTRAVENOUS | Status: DC | PRN
Start: 2021-05-07 — End: 2021-05-07
  Administered 2021-05-07: 30 mL via INTRAVENOUS

## 2021-05-07 MED ORDER — MIDAZOLAM HCL 5 MG/5ML IJ SOLN
INTRAMUSCULAR | Status: DC | PRN
  Administered 2021-05-07: 1 mg via INTRAVENOUS

## 2021-05-07 MED ORDER — CEFAZOLIN SODIUM-DEXTROSE 2-4 GM/100ML-% IV SOLN
2.0000 g | INTRAVENOUS | Status: AC
  Administered 2021-05-07: 2 g via INTRAVENOUS
  Filled 2021-05-07: qty 100

## 2021-05-07 MED ORDER — FENTANYL CITRATE PF 50 MCG/ML IJ SOSY: 25.0000 ug | PREFILLED_SYRINGE | INTRAMUSCULAR | Status: DC | PRN

## 2021-05-07 MED ORDER — DEXAMETHASONE SODIUM PHOSPHATE 10 MG/ML IJ SOLN
INTRAMUSCULAR | Status: AC
  Filled 2021-05-07: qty 1

## 2021-05-07 MED ORDER — ACETAMINOPHEN 10 MG/ML IV SOLN: 1000.0000 mg | Freq: Once | INTRAVENOUS | Status: DC | PRN

## 2021-05-07 MED ORDER — LIDOCAINE 2% (20 MG/ML) 5 ML SYRINGE
INTRAMUSCULAR | Status: DC | PRN
  Administered 2021-05-07: 40 mg via INTRAVENOUS

## 2021-05-07 MED ORDER — ORAL CARE MOUTH RINSE: 15.0000 mL | Freq: Once | OROMUCOSAL | Status: AC

## 2021-05-07 MED ORDER — PHENYLEPHRINE HCL-NACL 20-0.9 MG/250ML-% IV SOLN
INTRAVENOUS | Status: DC | PRN
  Administered 2021-05-07: 35 ug/min via INTRAVENOUS

## 2021-05-07 MED ORDER — BUPIVACAINE HCL (PF) 0.5 % IJ SOLN
INTRAMUSCULAR | Status: DC | PRN
  Administered 2021-05-07: 15 mL via PERINEURAL

## 2021-05-07 MED ORDER — AMISULPRIDE (ANTIEMETIC) 5 MG/2ML IV SOLN: 10.0000 mg | Freq: Once | INTRAVENOUS | Status: DC | PRN

## 2021-05-07 MED ORDER — SUGAMMADEX SODIUM 200 MG/2ML IV SOLN
INTRAVENOUS | Status: DC | PRN
  Administered 2021-05-07: 200 mg via INTRAVENOUS

## 2021-05-07 MED ORDER — TRIAMCINOLONE ACETONIDE 40 MG/ML IJ SUSP
INTRAMUSCULAR | Status: AC
  Filled 2021-05-07: qty 2

## 2021-05-07 MED ORDER — EPINEPHRINE PF 1 MG/ML IJ SOLN
INTRAMUSCULAR | Status: AC
  Filled 2021-05-07: qty 2

## 2021-05-07 MED ORDER — ONDANSETRON HCL 4 MG/2ML IJ SOLN
INTRAMUSCULAR | Status: AC
  Filled 2021-05-07: qty 2

## 2021-05-07 MED ORDER — FENTANYL CITRATE (PF) 100 MCG/2ML IJ SOLN
INTRAMUSCULAR | Status: AC
Start: 2021-05-07 — End: ?
  Filled 2021-05-07: qty 2

## 2021-05-07 MED ORDER — DEXAMETHASONE SODIUM PHOSPHATE 10 MG/ML IJ SOLN
INTRAMUSCULAR | Status: DC | PRN
  Administered 2021-05-07: 5 mg via INTRAVENOUS

## 2021-05-07 MED ORDER — PROPOFOL 10 MG/ML IV BOLUS
INTRAVENOUS | Status: AC
  Filled 2021-05-07: qty 20

## 2021-05-07 MED ORDER — EPINEPHRINE PF 1 MG/ML IJ SOLN
INTRAMUSCULAR | Status: DC | PRN
Start: 2021-05-07 — End: 2021-05-07
  Administered 2021-05-07: 1.5 mg

## 2021-05-07 MED ORDER — PROPOFOL 10 MG/ML IV BOLUS
INTRAVENOUS | Status: DC | PRN
  Administered 2021-05-07: 200 mg via INTRAVENOUS

## 2021-05-07 MED ORDER — CHLORHEXIDINE GLUCONATE 0.12 % MT SOLN
15.0000 mL | Freq: Once | OROMUCOSAL | Status: AC
  Administered 2021-05-07: 15 mL via OROMUCOSAL

## 2021-05-07 SURGICAL SUPPLY — 66 items
BAG COUNTER SPONGE SURGICOUNT (BAG) IMPLANT
BLADE EXCALIBUR 5.0X13 (MISCELLANEOUS) ×3 IMPLANT
BLADE SURG 15 STRL LF DISP TIS (BLADE) IMPLANT
BLADE SURG 15 STRL SS (BLADE)
BLADE SURG SZ11 CARB STEEL (BLADE) ×2 IMPLANT
BURR OVAL 8 FLU 5.0X13 (MISCELLANEOUS) ×3 IMPLANT
CANISTER SUCT 3000ML PPV (MISCELLANEOUS) IMPLANT
CANNULA 5.75X7 CRYSTAL CLEAR (CANNULA) ×2 IMPLANT
CANNULA 5.75X71 LONG (CANNULA) ×1 IMPLANT
CANNULA TWIST IN 8.25X7CM (CANNULA) IMPLANT
CHLORAPREP W/TINT 26 (MISCELLANEOUS) ×2 IMPLANT
DRAPE ORTHO SPLIT 77X108 STRL (DRAPES) ×4
DRAPE POUCH INSTRU U-SHP 10X18 (DRAPES) ×4 IMPLANT
DRAPE SHEET LG 3/4 BI-LAMINATE (DRAPES) ×2 IMPLANT
DRAPE STERI 35X30 U-POUCH (DRAPES) ×2 IMPLANT
DRAPE SURG 17X11 SM STRL (DRAPES) ×2 IMPLANT
DRAPE SURG ORHT 6 SPLT 77X108 (DRAPES) ×2 IMPLANT
DRAPE U-SHAPE 47X51 STRL (DRAPES) ×2 IMPLANT
DRSG PAD ABDOMINAL 8X10 ST (GAUZE/BANDAGES/DRESSINGS) ×2 IMPLANT
DW OUTFLOW CASSETTE/TUBE SET (MISCELLANEOUS) ×2 IMPLANT
ELECT REM PT RETURN 15FT ADLT (MISCELLANEOUS) IMPLANT
EXCALIBUR 3.8MM X 13CM (MISCELLANEOUS) ×3 IMPLANT
FIBER TAPE 2MM (SUTURE) IMPLANT
FIBERSTICK 2 (SUTURE) IMPLANT
FILTER STRAW (MISCELLANEOUS) ×2 IMPLANT
GAUZE SPONGE 4X4 12PLY STRL (GAUZE/BANDAGES/DRESSINGS) ×2 IMPLANT
GAUZE XEROFORM 1X8 LF (GAUZE/BANDAGES/DRESSINGS) ×2 IMPLANT
GLOVE SURG ENC MOIS LTX SZ8 (GLOVE) ×2 IMPLANT
GLOVE SURG POLYISO LF SZ7 (GLOVE) ×2 IMPLANT
GLOVE SURG UNDER LTX SZ8 (GLOVE) ×2 IMPLANT
GLOVE SURG UNDER POLY LF SZ7.5 (GLOVE) ×2 IMPLANT
KIT BASIN OR (CUSTOM PROCEDURE TRAY) ×2 IMPLANT
KIT TURNOVER KIT A (KITS) IMPLANT
LASSO 90 CVE QUICKPAS (DISPOSABLE) ×2 IMPLANT
MANIFOLD NEPTUNE II (INSTRUMENTS) IMPLANT
NDL 1/2 CIR CATGUT .05X1.09 (NEEDLE) IMPLANT
NDL SCORPION MULTI FIRE (NEEDLE) IMPLANT
NEEDLE 1/2 CIR CATGUT .05X1.09 (NEEDLE) IMPLANT
NEEDLE SCORPION MULTI FIRE (NEEDLE) IMPLANT
PACK ARTHROSCOPY WL (CUSTOM PROCEDURE TRAY) ×2 IMPLANT
PENCIL SMOKE EVACUATOR (MISCELLANEOUS) IMPLANT
PORT APPOLLO RF 90DEGREE MULTI (SURGICAL WAND) ×2 IMPLANT
PROTECTOR NERVE ULNAR (MISCELLANEOUS) IMPLANT
SLEEVE ARM SUSPENSION SYSTEM (MISCELLANEOUS) ×2 IMPLANT
SLING ARM IMMOBILIZER LRG (SOFTGOODS) ×1 IMPLANT
SLING ULTRA II L (ORTHOPEDIC SUPPLIES) IMPLANT
SLING ULTRA III MED (ORTHOPEDIC SUPPLIES) IMPLANT
SPONGE T-LAP 4X18 ~~LOC~~+RFID (SPONGE) IMPLANT
SUCTION FRAZIER HANDLE 10FR (MISCELLANEOUS)
SUCTION TUBE FRAZIER 10FR DISP (MISCELLANEOUS) IMPLANT
SUT ETHILON 3 0 PS 1 (SUTURE) ×2 IMPLANT
SUT PDS AB 1 CT1 27 (SUTURE) IMPLANT
SUT TIGER TAPE 7 IN WHITE (SUTURE) IMPLANT
SUT VIC AB 0 CT1 36 (SUTURE) IMPLANT
SUT VIC AB 2-0 CT1 27 (SUTURE)
SUT VIC AB 2-0 CT1 TAPERPNT 27 (SUTURE) IMPLANT
SUTURE TAPE 1.3 40 TPR END (SUTURE) IMPLANT
SUTURETAPE 1.3 40 TPR END (SUTURE)
SYR 27GX1/2 1ML LL SAFETY (SYRINGE) ×2 IMPLANT
SYR CONTROL 10ML LL (SYRINGE) ×2 IMPLANT
TAPE PAPER 1X10 WHT MICROPORE (GAUZE/BANDAGES/DRESSINGS) ×1 IMPLANT
TOWEL OR 17X26 10 PK STRL BLUE (TOWEL DISPOSABLE) ×2 IMPLANT
TUBING ARTHROSCOPY IRRIG 16FT (MISCELLANEOUS) ×2 IMPLANT
TUBING CONNECTING 10 (TUBING) IMPLANT
WATER STERILE IRR 500ML POUR (IV SOLUTION) ×2 IMPLANT
YANKAUER SUCT BULB TIP NO VENT (SUCTIONS) IMPLANT

## 2021-05-07 NOTE — Op Note (Signed)
Preop diagnosis right shoulder impingement syndrome, rotator cuff tear, biceps tendinopathy partial tear, chondromalacia, impingement syndrome, AC joint arthritis ?Postop diagnosis #1 right shoulder impingement syndrome with partial rotator cuff tear.  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 ?Operative details ?Patient was encountered in the holding area correct site identified and marked signed appropriately.  IV started interscalene block administered.  IV antibiotics were given within 1 hour the surgical incision time.  Patient was taken the operating room placed in supine position under general anesthesia.  PAS stockings were applied for DVT prophylaxis following timeout she is placed shoulder was examined her manipulation examination revealed essentially functional range of motion without excessive pressure.  She is then turned to a left lateral decubitus position properly padded and prepped with ChloraPrep and draped into a sterile fashion.  Utilized the [employer] shoulder holder 30 degrees of abduction and 30 degrees of forward flexion.  10 pounds of weight to keep the extremity with minimal longitudinal traction to protected.  Another timeout was done posterior portal was created arthroscope placed to the glenohumeral joint immediately identifiable was partial  cuff tear supra and infraspinatus without extravasation of fluid into the subacromial region biceps showed marked tendinopathy with tenosynovitis and partial tearing type I labral tearing rather extensive superiorly and anteriorly and grade II chondromalacia humeral head and glenoid with loose chondral flaps anterior portal was made and outside in technique to the rotator cuff interval shaver was introduced chondroplasty performed for loose chondral flaps.  Cautery utilized for biceps tenotomy of the supraglenoid tubercle.  Labrum debrided and smoothed nicely with a shaver and cautery. ? ?Subacromial region revealed a ? ? ?Very thick subacromial subdeltoid bursa lateral portal was established neurovascular structures were protected include ?Nerve and subacromial subdeltoid bursectomy was performed.  At this time the she had a compromised subacromial anatomy with underlying subclavicular spur and anterior inferior lateral acromion enthesopathy utilizing cautery the periosteum was taken down CA ligament was released deltoid fascia was preserved and protected bur was placed posteriorly and anterior inferior lateral acromioplasty was performed confirmed to a flat require morphology significantly decompressing the subacromial region as the cuff showed partial tearing underneath the coracoacromial arch.  The anterior portal was then utilized for bur for distal clavicle resection removing the subacromial spur and the lateral 5 to 8 mm of the clavicle.  The superior capsule was left intact the clavicle was palpated found be stable debris was removed hemostasis was obtained.  Arthroscopic was removed she is intact traction normal pulses portal) suture sterile dressings applied.  Turned supine awakened to the operating PACU stable condition.  Should be stabilized in PACU discharged home. ?To help with patient positioning prepping draping technical surgical assistance throughout the entire case wound closure application  dressing sling Ms. Elenor Legato, PA assistance was needed.  ?

## 2021-05-07 NOTE — Anesthesia Procedure Notes (Signed)
Anesthesia Regional Block: Interscalene brachial plexus block  ? ?Pre-Anesthetic Checklist: , timeout performed,  Correct Patient, Correct Site, Correct Laterality,  Correct Procedure, Correct Position, site marked,  Risks and benefits discussed,  Surgical consent,  Pre-op evaluation,  At surgeon's request and post-op pain management ? ?Laterality: Right ? ?Prep: chloraprep     ?  ?Needles:  ?Injection technique: Single-shot ? ?Needle Type: Echogenic Stimulator Needle   ? ? ?Needle Length: 9cm  ?Needle Gauge: 21  ? ? ? ?Additional Needles: ? ? ?Procedures:,,,, ultrasound used (permanent image in chart),,    ?Narrative:  ?Start time: 05/07/2021 7:20 AM ?End time: 05/07/2021 7:30 AM ?Injection made incrementally with aspirations every 5 mL. ? ?Performed by: Personally  ?Anesthesiologist: Murvin Natal, MD ? ?Additional Notes: ?Functioning IV was confirmed and monitors were applied.  A timeout was performed. Sterile prep, hand hygiene and sterile gloves were used. A 60m 21ga Arrow echogenic stimulator needle was used. Negative aspiration and negative test dose prior to incremental administration of local anesthetic. The patient tolerated the procedure well. ? ?Ultrasound guidance: relevent anatomy identified, needle position confirmed, local anesthetic spread visualized around nerve(s), vascular puncture avoided.  Image printed for medical record.  ? ? ? ? ? ?

## 2021-05-07 NOTE — Anesthesia Postprocedure Evaluation (Signed)
Anesthesia Post Note ? ?Patient: Ruth [name] ? ?Procedure(s) Performed: SHOULDER ARTHROSCOPY WITH MANIPULATION UNDER ANESTHESIA ,SUBACROMIAL DECOMPRESSION, DISTAL CLAVICLE RESECTION, ROTATOR CUFF REPAIR , BICEPS TENOTOMY (Right) ? ?  ? ?Patient location during evaluation: PACU ?Anesthesia Type: Regional and General ?Level of consciousness: awake ?Pain management: pain level controlled ?Vital Signs Assessment: post-procedure vital signs reviewed and stable ?Respiratory status: spontaneous breathing, nonlabored ventilation, respiratory function stable and patient connected to nasal cannula oxygen ?Cardiovascular status: blood pressure returned to baseline and stable ?Postop Assessment: no apparent nausea or vomiting ?Anesthetic complications: no ? ? ?No notable events documented. ? ?Last Vitals:  ?Vitals:  ? 05/07/21 0944 05/07/21 0945  ?BP:  (!) 161/63  ?Pulse: 98 98  ?Resp: 20 17  ?Temp:  36.6 ?C  ?SpO2: 95% 93%  ?  ?Last Pain:  ?Vitals:  ? 05/07/21 0959  ?TempSrc:   ?PainSc: 0-No pain  ? ? ?  ?  ?  ?  ?  ?  ? ?Natavia Sublette P Dominyk Law ? ? ? ? ?

## 2021-05-07 NOTE — Transfer of Care (Signed)
Immediate Anesthesia Transfer of Care Note ? ?Patient: Ruth [name] ? ?Procedure(s) Performed: SHOULDER ARTHROSCOPY WITH MANIPULATION UNDER ANESTHESIA ,SUBACROMIAL DECOMPRESSION, DISTAL CLAVICLE RESECTION, ROTATOR CUFF REPAIR , BICEPS TENOTOMY (Right) ? ?Patient Location: PACU ? ?Anesthesia Type:General ? ?Level of Consciousness: awake, alert  and oriented ? ?Airway & Oxygen Therapy: Patient Spontanous Breathing and Patient connected to face mask oxygen ? ?Post-op Assessment: Report given to RN and Post -op Vital signs reviewed and stable ? ?Post vital signs: Reviewed and stable ? ?Last Vitals:  ?Vitals Value Taken Time  ?BP 168/84 05/07/21 0919  ?Temp    ?Pulse 102 05/07/21 0925  ?Resp 16 05/07/21 0925  ?SpO2 100 % 05/07/21 0925  ?Vitals shown include unvalidated device data. ? ?Last Pain:  ?Vitals:  ? 05/07/21 0919  ?TempSrc:   ?PainSc: 0-No pain  ?   ? ?Patients Stated Pain Goal: 3 (05/07/21 2637) ? ?Complications: No notable events documented. ?

## 2021-05-07 NOTE — Anesthesia Procedure Notes (Signed)
Procedure Name: Intubation ?Date/Time: 05/07/2021 7:59 AM ?Performed by: Maxwell Caul, CRNA ?Pre-anesthesia Checklist: Patient identified, Emergency Drugs available, Suction available and Patient being monitored ?Patient Re-evaluated:Patient Re-evaluated prior to induction ?Oxygen Delivery Method: Circle system utilized ?Preoxygenation: Pre-oxygenation with 100% oxygen ?Induction Type: IV induction ?Ventilation: Mask ventilation without difficulty ?Laryngoscope Size: Mac and 4 ?Grade View: Grade I ?Tube type: Oral ?Tube size: 7.0 mm ?Number of attempts: 1 ?Airway Equipment and Method: Stylet ?Placement Confirmation: ETT inserted through vocal cords under direct vision, positive ETCO2 and breath sounds checked- equal and bilateral ?Secured at: 21 cm ?Tube secured with: Tape ?Dental Injury: Teeth and Oropharynx as per pre-operative assessment  ? ? ? ? ?

## 2021-05-07 NOTE — Discharge Instructions (Addendum)
Right shoulder manipulation under anesthesia, subacromial decompression, distal clavicle resection: ?-Please keep dressings on and dry for the next 3 days. Okay to remove them on day 3 and shower. Do not scrub at incision sites. Pat dry with a clean towel and place bandages over incision sites daily. ?-Okay to come out of sling as soon as comfortable and tolerating it ?-okay for activity as tolerated ?-Start PT in 1 week ?-Prescriptions have been sent to your pharmacy [facility], [place] ?-It is also recommend that you take additional 1000 mg of Tylenol alternating with 800 mg of Ibuprofen q 4-6 hrs and use the pain medication for break through severe pain.  ?-Follow up in the office in 2 weeks for suture removal. ? ? ?

## 2021-05-07 NOTE — Interval H&P Note (Signed)
History and Physical Interval Note: ? ?05/07/2021 ?7:41 AM ? ?Ruth [name]  has presented today for surgery, with the diagnosis of Right shoulder rotator cuff tear,acromioclavicular osteoarthritis, biceps subluxation.  The various methods of treatment have been discussed with the patient and family. After consideration of risks, benefits and other options for treatment, the patient has consented to  Procedure(s) with comments: ?SHOULDER ARTHROSCOPY WITH MANIPULATION UNDER ANESTHESIA ,SUBACROMIAL DECOMPRESSION, DISTAL CLAVICLE RESECTION, ROTATOR CUFF REPAIR , BICEPS TENOTOMY (Right) - WITH INTERSCALENE BLOCK ?NEEDS PRE OP CONSULT  WITH ANESTHESIA ?120 as a surgical intervention.  The patient's history has been reviewed, patient examined, no change in status, stable for surgery.  I have reviewed the patient's chart and labs.  Questions were answered to the patient's satisfaction.   ? ? ?Garnell Phenix ANDREW ? ? ?

## 2021-05-07 NOTE — H&P (Signed)
Ruth [name] is an 81 y.o. female.   ?Chief Complaint: Right shoulder rotator cuff tear ?HPI: This is a pleasant 81 year old female who has been dealing with right shoulder pain for over a year now. No known injury for this shoulder. She has tried conservative management and has failed. MRI scan showed rotator cuff tear, biceps tendinosis and ACOA.  ? ?Past Medical History:  ?Diagnosis Date  ? Anemia   ? Anxiety   ? Arthritis   ? Asthma   ? Depression   ? Eczema   ? Fatty liver   ? Fibromyalgia   ? GERD (gastroesophageal reflux disease)   ? EGD/ colon 1/09  ? H pylori ulcer 1980-1990  ? s/p treatment  ? History of kidney stones   ? Hyperlipidemia   ? IBS (irritable bowel syndrome)   ? Kidney cysts   ? Laryngospasm   ? Neck pain   ? OSA (obstructive sleep apnea)   ? Pneumonia 2013  ? PONV (postoperative nausea and vomiting)   ? Pre-diabetes   ? Refusal of blood transfusions as patient is Jehovah's Witness   ? ? ?Past Surgical History:  ?Procedure Laterality Date  ? ABDOMINAL HYSTERECTOMY  1970  ? BILATERAL SALPINGOOPHORECTOMY  1990s  ? BIOPSY  09/11/2020  ? Procedure: BIOPSY;  Surgeon: Rogene Houston, MD;  [facility];  Service: Endoscopy;;  gastric  ? CARDIAC CATHETERIZATION  2002  ? [facility]) normal coronary arteries   ? COLONOSCOPY  01/2007  ? Dr. Meriel Flavors  ? COLONOSCOPY N/A 04/18/2015  ? Procedure: COLONOSCOPY;  Surgeon: Rogene Houston, MD;  [facility];  Service: Endoscopy;  Laterality: N/A;  830 - moved to 8:55 - Ann to notify pt  ? COLONOSCOPY WITH ESOPHAGOGASTRODUODENOSCOPY (EGD)  02/16/2012  ? Procedure: COLONOSCOPY WITH ESOPHAGOGASTRODUODENOSCOPY (EGD);  Surgeon: Daneil Dolin, MD;  [facility];  Service: Endoscopy;  Laterality: N/A;  8:45  ? COLONOSCOPY WITH PROPOFOL N/A 09/11/2020  ? Procedure: COLONOSCOPY WITH PROPOFOL;  Surgeon: Rogene Houston, MD;  [facility];  Service: Endoscopy;  Laterality: N/A;  10:55  ? ESOPHAGEAL DILATION N/A  06/07/2014  ? Procedure: ESOPHAGEAL DILATION;  Surgeon: Rogene Houston, MD;  [facility];  Service: Endoscopy;  Laterality: N/A;  ? ESOPHAGEAL DILATION N/A 09/11/2020  ? Procedure: ESOPHAGEAL DILATION;  Surgeon: Rogene Houston, MD;  [facility];  Service: Endoscopy;  Laterality: N/A;  ? ESOPHAGOGASTRODUODENOSCOPY  01/2007  ? Dr Sharlett Iles- 3 cm hiatal hernia, benign esophageal biopsies, erosive esophagitis, gastritis  ? ESOPHAGOGASTRODUODENOSCOPY N/A 06/07/2014  ? Procedure: ESOPHAGOGASTRODUODENOSCOPY (EGD);  Surgeon: Rogene Houston, MD;  [facility];  Service: Endoscopy;  Laterality: N/A;  1200  ? ESOPHAGOGASTRODUODENOSCOPY N/A 08/18/2017  ? Procedure: ESOPHAGOGASTRODUODENOSCOPY (EGD);  Surgeon: Rogene Houston, MD;  [facility];  Service: Endoscopy;  Laterality: N/A;  2:20  ? ESOPHAGOGASTRODUODENOSCOPY (EGD) WITH PROPOFOL N/A 09/11/2020  ? Procedure: ESOPHAGOGASTRODUODENOSCOPY (EGD) WITH PROPOFOL;  Surgeon: Rogene Houston, MD;  [facility];  Service: Endoscopy;  Laterality: N/A;  ? FOOT SURGERY Left 2005  ? hammer toe  ? INNER EAR SURGERY    ? Left  ? KNEE SURGERY Left   ? x 2  ? POLYPECTOMY  09/11/2020  ? Procedure: POLYPECTOMY;  Surgeon: Rogene Houston, MD;  [facility];  Service: Endoscopy;;  ? SHOULDER SURGERY Left 2011  ? TONSILLECTOMY    ? age 60  ?  TOTAL KNEE ARTHROPLASTY Left 02/07/2018  ? Procedure: TOTAL KNEE ARTHROPLASTY;  Surgeon: Gaynelle Arabian, MD;  [facility]: [facility];  Service: Orthopedics;  Laterality: Left;  13mn  ? TUBAL LIGATION  1970  ? with an appy  ? TYMPANOSTOMY TUBE PLACEMENT  1984  ? ? ?Family History  ?Problem Relation Age of Onset  ? Diabetes Father   ? Heart attack Father   ?     580's ? Lung cancer Father   ? Cirrhosis Father 551 ?     etoh cirrhosis  ? Eczema Father   ? Lung cancer Sister   ? Depression Sister   ? Alzheimer's disease Sister   ? Hypertension Sister   ? Diabetes Sister   ? Lung cancer Sister    ? Paranoid behavior Mother   ?     depression  ? Colon polyps Mother   ? Eczema Mother   ? Depression Mother   ? COPD Daughter   ? Depression Maternal Aunt   ? Drug abuse Niece   ? Suicidality Niece   ? Allergic rhinitis Neg Hx   ? Asthma Neg Hx   ? Urticaria Neg Hx   ? ?Social History:  reports that she has never smoked. She has never used smokeless tobacco. She reports current alcohol use. She reports that she does not use drugs. ? ?Allergies:  ?Allergies  ?Allergen Reactions  ? Blood-Group Specific Substance Other (See Comments)  ?  NO BLOOD PRODUCTS-refuses transfusions  ? Ciprofloxacin Other (See Comments)  ?  Extreme fatigue  ? Codeine Nausea And Vomiting  ? Latex Other (See Comments)  ?  Red and raw area around incision  ? Prozac [Fluoxetine]   ?  Causing stomach pain and anxiety  - pt still takes daily  ? Sertraline Hcl Other (See Comments)  ?  Made patient a "zombie"  ? Vicodin [Hydrocodone-Acetaminophen] Nausea And Vomiting  ? Tramadol Swelling, Palpitations and Hypertension  ?  Increased heart rate,increased blood pressure, edema  ? ? ?Medications Prior to Admission  ?Medication Sig Dispense Refill  ? acetaminophen (TYLENOL) 500 MG tablet Take 1,000 mg by mouth every 6 (six) hours as needed for moderate pain.    ? acidophilus (RISAQUAD) CAPS capsule Take 1 capsule by mouth 4 (four) times a week.    ? albuterol (PROVENTIL HFA;VENTOLIN HFA) 108 (90 Base) MCG/ACT inhaler Inhale 2 puffs into the lungs every 6 (six) hours as needed for wheezing. 1 Inhaler 2  ? Ascorbic Acid (VITAMIN C PO) Take 1 tablet by mouth 4 (four) times a week.    ? b complex vitamins capsule Take 1 capsule by mouth 4 (four) times a week.    ? Coenzyme Q10 (CO Q 10 PO) Take 1 capsule by mouth daily.    ? diazepam (VALIUM) 5 MG tablet 1/2 to 1 taken 3 times daily as needed for severe anxiousness use sparingly (Patient taking differently: Take 2.5-5 mg by mouth 3 (three) times daily as needed for anxiety. 1/2 to 1 taken 3 times daily as  needed for severe anxiousness use sparingly) 30 tablet 1  ? dicyclomine (BENTYL) 10 MG capsule Take 1 capsule (10 mg total) by mouth 3 (three) times daily as needed. For stomach cramps (Patient taking differently: Take 10 mg by mouth 3 (three) times daily as needed (stomach cramps).) 90 capsule 5  ? famotidine (PEPCID) 40 MG tablet TAKE 1 TABLET  BY MOUTH AT BEDTIME. (Patient taking differently: Take 40 mg by mouth daily as  needed.) 90 tablet 1  ? FLUoxetine (PROZAC) 20 MG tablet Take 20 mg by mouth daily.    ? fluticasone (CUTIVATE) 0.05 % cream Apply 1 application topically 2 (two) times daily.    ? ketoconazole (NIZORAL) 2 % cream Apply 1 application topically daily. (Patient taking differently: Apply 1 application. topically daily as needed for irritation.) 60 g 5  ? MAGNESIUM PO Take 1 tablet by mouth 4 (four) times a week.    ? Multiple Vitamins-Minerals (ZINC PO) Take 1 tablet by mouth daily.    ? Polyvinyl Alcohol (LUBRICANT DROPS OP) Place 2 drops into both eyes 3 (three) times daily as needed (for dry eyes).    ? ? ?Results for orders placed or performed during the hospital encounter of 05/07/21 (from the past 48 hour(s))  ?Glucose, capillary     Status: Abnormal  ? Collection Time: 05/07/21  5:35 AM  ?Result Value Ref Range  ? Glucose-Capillary 101 (H) 70 - 99 mg/dL  ?  Comment: Glucose reference range applies only to samples taken after fasting for at least 8 hours.  ? ?No results found. ? ?Review of Systems  ?All other systems reviewed and are negative. ? ?Blood pressure (!) 154/72, pulse 72, temperature 98.1 ?F (36.7 ?C), temperature source Oral, resp. rate 18, height '5\' 5"'$  (1.651 m), weight 97.5 kg, SpO2 100 %. ?Physical Exam ?Constitutional:   ?   Appearance: Normal appearance. She is normal weight.  ?HENT:  ?   Head: Normocephalic and atraumatic.  ?Cardiovascular:  ?   Rate and Rhythm: Normal rate and regular rhythm.  ?   Pulses: Normal pulses.  ?   Heart sounds: Normal heart sounds.  ?Pulmonary:  ?    Effort: Pulmonary effort is normal.  ?   Breath sounds: Normal breath sounds.  ?Musculoskeletal:  ?   Comments: On examination of the right shoulder, she has normal contours and landmarks. Her AC joint is

## 2021-05-08 ENCOUNTER — Encounter (HOSPITAL_COMMUNITY): Payer: Self-pay | Admitting: Specialist

## 2021-05-13 NOTE — Progress Notes (Signed)
This is for prophylactic to prevent atelectasis  ?

## 2021-05-14 ENCOUNTER — Ambulatory Visit (HOSPITAL_COMMUNITY): Payer: Medicare HMO | Attending: Specialist | Admitting: Occupational Therapy

## 2021-05-14 ENCOUNTER — Encounter (HOSPITAL_COMMUNITY): Payer: Self-pay | Admitting: Occupational Therapy

## 2021-05-14 ENCOUNTER — Other Ambulatory Visit: Payer: Self-pay

## 2021-05-14 DIAGNOSIS — R29898 Other symptoms and signs involving the musculoskeletal system: Secondary | ICD-10-CM | POA: Diagnosis not present

## 2021-05-14 DIAGNOSIS — M25511 Pain in right shoulder: Secondary | ICD-10-CM | POA: Diagnosis not present

## 2021-05-14 NOTE — Therapy (Signed)
?OUTPATIENT OCCUPATIONAL THERAPY ORTHO EVALUATION ? ?Patient Name: Ruth [name] ?MRN: [id number] ?DOB:03/23/1940, 81 y.o., female ?Today's Date: 05/14/2021 ? ?PCP: Kathyrn Drown, MD ?REFERRING PROVIDER: Sydnee Cabal, MD ? ? OT End of Session - 05/14/21 1103   ? ? Visit Number 1   ? Number of Visits 16   ? Date for OT Re-Evaluation 07/13/21   mini-reassessment 06/12/2021  ? Authorization Type [employer]   ? Authorization Time Period requesting 12 visits   ? Authorization - Visit Number 0   ? Authorization - Number of Visits 12   ? Progress Note Due on Visit 10   ? OT Start Time 1032   ? OT Stop Time 1107   ? OT Time Calculation (min) 35 min   ? Activity Tolerance Patient tolerated treatment well   ? Behavior During Therapy [facility] for tasks assessed/performed   ? ?  ?  ? ?  ? ? ?Past Medical History:  ?Diagnosis Date  ? Anemia   ? Anxiety   ? Arthritis   ? Asthma   ? Depression   ? Eczema   ? Fatty liver   ? Fibromyalgia   ? GERD (gastroesophageal reflux disease)   ? EGD/ colon 1/09  ? H pylori ulcer 1980-1990  ? s/p treatment  ? History of kidney stones   ? Hyperlipidemia   ? IBS (irritable bowel syndrome)   ? Kidney cysts   ? Laryngospasm   ? Neck pain   ? OSA (obstructive sleep apnea)   ? Pneumonia 2013  ? PONV (postoperative nausea and vomiting)   ? Pre-diabetes   ? Refusal of blood transfusions as patient is Jehovah's Witness   ? ?Past Surgical History:  ?Procedure Laterality Date  ? ABDOMINAL HYSTERECTOMY  1970  ? BILATERAL SALPINGOOPHORECTOMY  1990s  ? BIOPSY  09/11/2020  ? Procedure: BIOPSY;  Surgeon: Rogene Houston, MD;  [facility];  Service: Endoscopy;;  gastric  ? CARDIAC CATHETERIZATION  2002  ? [facility]) normal coronary arteries   ? COLONOSCOPY  01/2007  ? Dr. Meriel Flavors  ? COLONOSCOPY N/A 04/18/2015  ? Procedure: COLONOSCOPY;  Surgeon: Rogene Houston, MD;  [facility];  Service: Endoscopy;  Laterality: N/A;  830 - moved to 8:55 - Ann to notify pt  ? COLONOSCOPY WITH  ESOPHAGOGASTRODUODENOSCOPY (EGD)  02/16/2012  ? Procedure: COLONOSCOPY WITH ESOPHAGOGASTRODUODENOSCOPY (EGD);  Surgeon: Daneil Dolin, MD;  [facility];  Service: Endoscopy;  Laterality: N/A;  8:45  ? COLONOSCOPY WITH PROPOFOL N/A 09/11/2020  ? Procedure: COLONOSCOPY WITH PROPOFOL;  Surgeon: Rogene Houston, MD;  [facility];  Service: Endoscopy;  Laterality: N/A;  10:55  ? ESOPHAGEAL DILATION N/A 06/07/2014  ? Procedure: ESOPHAGEAL DILATION;  Surgeon: Rogene Houston, MD;  [facility];  Service: Endoscopy;  Laterality: N/A;  ? ESOPHAGEAL DILATION N/A 09/11/2020  ? Procedure: ESOPHAGEAL DILATION;  Surgeon: Rogene Houston, MD;  [facility];  Service: Endoscopy;  Laterality: N/A;  ? ESOPHAGOGASTRODUODENOSCOPY  01/2007  ? Dr Sharlett Iles- 3 cm hiatal hernia, benign esophageal biopsies, erosive esophagitis, gastritis  ? ESOPHAGOGASTRODUODENOSCOPY N/A 06/07/2014  ? Procedure: ESOPHAGOGASTRODUODENOSCOPY (EGD);  Surgeon: Rogene Houston, MD;  [facility];  Service: Endoscopy;  Laterality: N/A;  1200  ? ESOPHAGOGASTRODUODENOSCOPY N/A 08/18/2017  ? Procedure: ESOPHAGOGASTRODUODENOSCOPY (EGD);  Surgeon: Rogene Houston, MD;  [facility];  Service: Endoscopy;  Laterality: N/A;  2:20  ? ESOPHAGOGASTRODUODENOSCOPY (EGD) WITH PROPOFOL  N/A 09/11/2020  ? Procedure: ESOPHAGOGASTRODUODENOSCOPY (EGD) WITH PROPOFOL;  Surgeon: Rogene Houston, MD;  [facility];  Service: Endoscopy;  Laterality: N/A;  ? FOOT SURGERY Left 2005  ? hammer toe  ? INNER EAR SURGERY    ? Left  ? KNEE SURGERY Left   ? x 2  ? POLYPECTOMY  09/11/2020  ? Procedure: POLYPECTOMY;  Surgeon: Rogene Houston, MD;  [facility];  Service: Endoscopy;;  ? SHOULDER ARTHROSCOPY WITH ROTATOR CUFF REPAIR Right 05/07/2021  ? Procedure: SHOULDER ARTHROSCOPY WITH MANIPULATION UNDER ANESTHESIA ,SUBACROMIAL DECOMPRESSION, DISTAL CLAVICLE RESECTION, ROTATOR CUFF REPAIR , BICEPS  TENOTOMY;  Surgeon: Sydnee Cabal, MD;  [facility];  Service: Orthopedics;  Laterality: Right;  WITH INTERSCALENE BLOCK ?NEEDS PRE OP CONSULT  WITH ANESTHESIA ?120  ? SHOULDER SURGERY Left 2011  ? TONSILLECTOMY    ? age 39  ? TOTAL KNEE ARTHROPLASTY Left 02/07/2018  ? Procedure: TOTAL KNEE ARTHROPLASTY;  Surgeon: Gaynelle Arabian, MD;  [facility];  Service: Orthopedics;  Laterality: Left;  30mn  ? TUBAL LIGATION  1970  ? with an appy  ? TYMPANOSTOMY TUBE PLACEMENT  1984  ? ?Patient Active Problem List  ? Diagnosis Date Noted  ? Pain in joint of right shoulder 07/09/2020  ? Body mass index (BMI) 35.0-35.9, adult 06/06/2019  ? Cervical spondylosis with radiculopathy 06/06/2019  ? MDD (major depressive disorder), recurrent, in partial remission ([place] 03/27/2019  ? Depression, major, single episode, mild ([place] 03/07/2018  ? History of total knee replacement, left 02/21/2018  ? OA (osteoarthritis) of knee 02/07/2018  ? Non-allergic rhinitis 12/16/2017  ? Allergy to metal 12/16/2017  ? Pain in right foot 09/21/2017  ? Primary osteoarthritis of both feet 04/29/2017  ? Osteoarthritis of foot joint 04/29/2017  ? Hammer toe 04/23/2017  ? Family history of premature coronary heart disease 04/15/2016  ? Primary osteoarthritis of both knees 12/31/2015  ? Vulvodynia 08/03/2014  ? Anxiety disorder 07/06/2014  ? Fibromyalgia 04/25/2014  ? Prediabetes 03/18/2014  ? Hyperlipidemia 03/18/2014  ? Osteopenia 03/06/2014  ? History of cardiac catheterization 04/28/2013  ? GERD (gastroesophageal reflux disease) 02/02/2012  ? Indigestion 02/02/2012  ? Esophageal dysphagia 02/05/2011  ? CHEST PAIN 02/11/2009  ? IRRITABLE BOWEL SYNDROME 02/15/2007  ? Primary osteoarthritis of both hands 02/15/2007  ? Degenerative joint disease of hand 02/15/2007  ? GASTRITIS 02/14/2007  ? ? ?ONSET DATE: 05/07/21 ? ?REFERRING DIAG: s/p right shoulder arthroscopy with biceps tenotomy ? ?THERAPY DIAG:  ?Acute pain of right shoulder ? ?Other symptoms  and signs involving the musculoskeletal system ? ?SUBJECTIVE:  ? ?SUBJECTIVE STATEMENT: ?S: It's going very well so far.  ?Pt accompanied by: significant other ? ?PERTINENT HISTORY: Pt is a 81y/o female s/p right shoulder arthroscopy with biceps tenotomy on 05/07/21. Pt reports MD told her she could remove her sling and use her arm for daily tasks as she is able. Pt was referred to occupational therapy for evaluation and treatment by Dr. RSydnee Cabal  ? ?PRECAUTIONS: Shoulder, see protocol ? ?WEIGHT BEARING RESTRICTIONS Yes NWB ? ?PAIN:  ?Are you having pain? No ? ?FALLS: Has patient fallen in last 6 months? No ? ?LIVING ENVIRONMENT: ?Lives with: lives with their family ? ? ?PLOF: Independent ? ?PATIENT GOALS To be able to use her arm and play with her dog.  ? ?OBJECTIVE:  ? ?HAND DOMINANCE: Right ? ?ADLs: ?Overall ADLs: Pt is having difficulty with dressing, bathing, reaching up and behind back, lifting items, pulling  drawer out. Pt sleeps in a chair.  ? ? ? ?FUNCTIONAL OUTCOME MEASURES: ?FOTO: complete next session ? ?UE ROM    ? ?Assessed seated, er/IR adducted ?Active ROM Right ?05/14/2021 Left ?05/14/2021  ?Shoulder flexion 90   ?Shoulder abduction 50   ?Shoulder internal rotation 90   ?Shoulder external rotation 16   ?(Blank rows = not tested) ? ?Assessed supine, er/IR adducted ?Passive ROM Right ?05/14/2021 Left ?05/14/2021  ?Shoulder flexion 124   ?Shoulder abduction 58   ?Shoulder internal rotation 90   ?Shoulder external rotation 12   ?(Blank rows = not tested) ? ? ? ?UE MMT:    ? ?Unable to test due to pain and precautions ? ?MMT Right ?05/14/2021 Left ?05/14/2021  ?Shoulder flexion    ?Shoulder abduction    ?Shoulder internal rotation    ?Shoulder external rotation    ?(Blank rows = not tested) ? ? ?COGNITION: ?Overall cognitive status: Within functional limits for tasks assessed ? ? ? ? ?TODAY'S TREATMENT:  ?N/A ? ? ?PATIENT EDUCATION: ?Education details: table slides ?Person educated: Patient ?Education  method: Explanation, Demonstration, and Handouts ?Education comprehension: verbalized understanding and returned demonstration ? ? ?HOME EXERCISE PROGRAM: ?Eval: table slides ? ?GOALS: ?Goals reviewed with pa

## 2021-05-14 NOTE — Patient Instructions (Signed)
1) SHOULDER: Flexion On Table   Place hands on towel placed on table, elbows straight. Lean forward with you upper body, pushing towel away from body.  _10-15__ reps per set, __3_ sets per day  2) Abduction (Passive)   With arm out to side, resting on towel placed on table with palm DOWN, keeping trunk away from table, lean to the side while pushing towel away from body.  Repeat _10-15___ times. Do __3__ sessions per day.  Copyright  V[employer]. All rights reserved.     3) Internal Rotation (Assistive)   Seated with elbow bent at right angle and held against side, slide arm on table surface in an inward arc keeping elbow anchored in place. Repeat __10-15__ times. Do __3__ sessions per day. Activity: Use this motion to brush crumbs off the table.  Copyright  VHI. All rights reserved.   

## 2021-05-15 ENCOUNTER — Ambulatory Visit (HOSPITAL_COMMUNITY): Payer: Medicare HMO

## 2021-05-21 ENCOUNTER — Encounter (HOSPITAL_COMMUNITY): Payer: Self-pay | Admitting: Occupational Therapy

## 2021-05-21 ENCOUNTER — Ambulatory Visit (HOSPITAL_COMMUNITY): Payer: Medicare HMO | Attending: Specialist | Admitting: Occupational Therapy

## 2021-05-21 DIAGNOSIS — M25511 Pain in right shoulder: Secondary | ICD-10-CM | POA: Diagnosis not present

## 2021-05-21 DIAGNOSIS — M25611 Stiffness of right shoulder, not elsewhere classified: Secondary | ICD-10-CM | POA: Diagnosis not present

## 2021-05-21 DIAGNOSIS — R29898 Other symptoms and signs involving the musculoskeletal system: Secondary | ICD-10-CM | POA: Insufficient documentation

## 2021-05-21 NOTE — Therapy (Signed)
?OUTPATIENT OCCUPATIONAL THERAPY TREATMENT NOTE ? ?.oprcot ? ?Patient Name: Ruth [name] ?MRN: [id number] ?DOB:Jul 01, 1940, 81 y.o., female ?Today's Date: 05/21/2021 ? ?PCP: Kathyrn Drown, MD ?REFERRING PROVIDER: Kathyrn Drown, MD ? ?END OF SESSION:  ? OT End of Session - 05/21/21 1247   ? ? Visit Number 2   ? Number of Visits 16   ? Date for OT Re-Evaluation 07/13/21   mini-reassessment 06/12/2021  ? Authorization Type [employer]   ? Authorization Time Period requesting 12 visits; 12 visits approved   ? Authorization - Visit Number 1   ? Authorization - Number of Visits 12   ? Progress Note Due on Visit 10   ? OT Start Time 1118   ? OT Stop Time 4128   ? OT Time Calculation (min) 41 min   ? Activity Tolerance Patient tolerated treatment well   ? Behavior During Therapy [facility] for tasks assessed/performed   ? ?  ?  ? ?  ? ? ?Past Medical History:  ?Diagnosis Date  ? Anemia   ? Anxiety   ? Arthritis   ? Asthma   ? Depression   ? Eczema   ? Fatty liver   ? Fibromyalgia   ? GERD (gastroesophageal reflux disease)   ? EGD/ colon 1/09  ? H pylori ulcer 1980-1990  ? s/p treatment  ? History of kidney stones   ? Hyperlipidemia   ? IBS (irritable bowel syndrome)   ? Kidney cysts   ? Laryngospasm   ? Neck pain   ? OSA (obstructive sleep apnea)   ? Pneumonia 2013  ? PONV (postoperative nausea and vomiting)   ? Pre-diabetes   ? Refusal of blood transfusions as patient is Jehovah's Witness   ? ?Past Surgical History:  ?Procedure Laterality Date  ? ABDOMINAL HYSTERECTOMY  1970  ? BILATERAL SALPINGOOPHORECTOMY  1990s  ? BIOPSY  09/11/2020  ? Procedure: BIOPSY;  Surgeon: Rogene Houston, MD;  [facility];  Service: Endoscopy;;  gastric  ? CARDIAC CATHETERIZATION  2002  ? [facility]) normal coronary arteries   ? COLONOSCOPY  01/2007  ? Dr. Meriel Flavors  ? COLONOSCOPY N/A 04/18/2015  ? Procedure: COLONOSCOPY;  Surgeon: Rogene Houston, MD;  [facility];  Service: Endoscopy;  Laterality: N/A;  830 - moved  to 8:55 - Ann to notify pt  ? COLONOSCOPY WITH ESOPHAGOGASTRODUODENOSCOPY (EGD)  02/16/2012  ? Procedure: COLONOSCOPY WITH ESOPHAGOGASTRODUODENOSCOPY (EGD);  Surgeon: Daneil Dolin, MD;  [facility];  Service: Endoscopy;  Laterality: N/A;  8:45  ? COLONOSCOPY WITH PROPOFOL N/A 09/11/2020  ? Procedure: COLONOSCOPY WITH PROPOFOL;  Surgeon: Rogene Houston, MD;  [facility];  Service: Endoscopy;  Laterality: N/A;  10:55  ? ESOPHAGEAL DILATION N/A 06/07/2014  ? Procedure: ESOPHAGEAL DILATION;  Surgeon: Rogene Houston, MD;  [facility];  Service: Endoscopy;  Laterality: N/A;  ? ESOPHAGEAL DILATION N/A 09/11/2020  ? Procedure: ESOPHAGEAL DILATION;  Surgeon: Rogene Houston, MD;  [facility];  Service: Endoscopy;  Laterality: N/A;  ? ESOPHAGOGASTRODUODENOSCOPY  01/2007  ? Dr Sharlett Iles- 3 cm hiatal hernia, benign esophageal biopsies, erosive esophagitis, gastritis  ? ESOPHAGOGASTRODUODENOSCOPY N/A 06/07/2014  ? Procedure: ESOPHAGOGASTRODUODENOSCOPY (EGD);  Surgeon: Rogene Houston, MD;  [facility];  Service: Endoscopy;  Laterality: N/A;  1200  ? ESOPHAGOGASTRODUODENOSCOPY N/A 08/18/2017  ? Procedure: ESOPHAGOGASTRODUODENOSCOPY (EGD);  Surgeon: Rogene Houston, MD;  [facility];  Service: Endoscopy;  Laterality: N/A;  2:20  ? ESOPHAGOGASTRODUODENOSCOPY (EGD) WITH PROPOFOL N/A 09/11/2020  ? Procedure: ESOPHAGOGASTRODUODENOSCOPY (EGD) WITH PROPOFOL;  Surgeon: Rogene Houston, MD;  [facility];  Service: Endoscopy;  Laterality: N/A;  ? FOOT SURGERY Left 2005  ? hammer toe  ? INNER EAR SURGERY    ? Left  ? KNEE SURGERY Left   ? x 2  ? POLYPECTOMY  09/11/2020  ? Procedure: POLYPECTOMY;  Surgeon: Rogene Houston, MD;  [facility];  Service: Endoscopy;;  ? SHOULDER ARTHROSCOPY WITH ROTATOR CUFF REPAIR Right 05/07/2021  ? Procedure: SHOULDER ARTHROSCOPY WITH MANIPULATION UNDER ANESTHESIA ,SUBACROMIAL DECOMPRESSION, DISTAL CLAVICLE  RESECTION, ROTATOR CUFF REPAIR , BICEPS TENOTOMY;  Surgeon: Sydnee Cabal, MD;  [facility]: [facility];  Service: Orthopedics;  Laterality: Right;  WITH INTERSCALENE BLOCK ?NEEDS PRE OP CONSULT  WITH ANESTHESIA ?120  ? SHOULDER SURGERY Left 2011  ? TONSILLECTOMY    ? age 75  ? TOTAL KNEE ARTHROPLASTY Left 02/07/2018  ? Procedure: TOTAL KNEE ARTHROPLASTY;  Surgeon: Gaynelle Arabian, MD;  [facility]: [facility];  Service: Orthopedics;  Laterality: Left;  21mn  ? TUBAL LIGATION  1970  ? with an appy  ? TYMPANOSTOMY TUBE PLACEMENT  1984  ? ?Patient Active Problem List  ? Diagnosis Date Noted  ? Pain in joint of right shoulder 07/09/2020  ? Body mass index (BMI) 35.0-35.9, adult 06/06/2019  ? Cervical spondylosis with radiculopathy 06/06/2019  ? MDD (major depressive disorder), recurrent, in partial remission ([place] 03/27/2019  ? Depression, major, single episode, mild ([place] 03/07/2018  ? History of total knee replacement, left 02/21/2018  ? OA (osteoarthritis) of knee 02/07/2018  ? Non-allergic rhinitis 12/16/2017  ? Allergy to metal 12/16/2017  ? Pain in right foot 09/21/2017  ? Primary osteoarthritis of both feet 04/29/2017  ? Osteoarthritis of foot joint 04/29/2017  ? Hammer toe 04/23/2017  ? Family history of premature coronary heart disease 04/15/2016  ? Primary osteoarthritis of both knees 12/31/2015  ? Vulvodynia 08/03/2014  ? Anxiety disorder 07/06/2014  ? Fibromyalgia 04/25/2014  ? Prediabetes 03/18/2014  ? Hyperlipidemia 03/18/2014  ? Osteopenia 03/06/2014  ? History of cardiac catheterization 04/28/2013  ? GERD (gastroesophageal reflux disease) 02/02/2012  ? Indigestion 02/02/2012  ? Esophageal dysphagia 02/05/2011  ? CHEST PAIN 02/11/2009  ? IRRITABLE BOWEL SYNDROME 02/15/2007  ? Primary osteoarthritis of both hands 02/15/2007  ? Degenerative joint disease of hand 02/15/2007  ? GASTRITIS 02/14/2007  ? ? ?ONSET DATE: 05/07/21 ? ?REFERRING DIAG: s/p right shoulder arthroscopy with biceps tenotomy ? ?THERAPY DIAG:  ?Acute  pain of right shoulder ? ? ?PERTINENT HISTORY:  Pt is a 81y/o female s/p right shoulder arthroscopy with biceps tenotomy on 05/07/21. Pt reports MD told her she could remove her sling and use her arm for daily tasks as she is able. Pt was referred to occupational therapy for evaluation and treatment by Dr. RSydnee Cabal ? ?PRECAUTIONS: Shoulder, see protocol ? ?SUBJECTIVE: It was feeling good.  ? ?PAIN:  ?Are you having pain? Yes: NPRS scale: 8/10 ?Pain location: R shoulder ?Pain description: burning, sore ?Aggravating factors: Lifting ?Relieving factors: Pain medication.  ? ? ? ? ?OBJECTIVE: FUNCTIONAL OUTCOME MEASURES: ?FOTO: complete next session ? ?TODAY'S TREATMENT: ?-myofascial release ?Manual therapy completed separately from all other interventions  ?Myofascial release and manual stretching completed to the right upper arm, upper trapezius, and scapularis region to decrease fascial restrictions and increase joint mobility in a pain free zone.   ?-P/ROM supine x10 reps protraction,  flexion, abduction, er/IR, horizontal abduction.  ?-theraball rolls x10 reps with 2" hold for flexion and abduction  ? ? ?CLINICAL IMPRESSION: ?A: Pt tolerated manual therapy with significant tenderness to touch initially but tolerated more with prolonged myofascial release. Pt tolerate P/ROM supine followed by theraball roles. Verbal and tactile cuing throughout session.  ?  ?PERFORMANCE DEFICITS in functional skills including ADLs, IADLs, ROM, strength, pain, fascial restrictions, muscle spasms, endurance, and UE functional use ?  ?IMPAIRMENTS are limiting patient from ADLs, IADLs, rest and sleep, and leisure.  ?  ?GOALS: ?Goals reviewed with patient? Yes ?  ?SHORT TERM GOALS: Target date: 06/11/2021 ?  ?Pt will be provided with and educated on HEP to improve mobility of RUE required for use as dominant during ADL completion.  ?  ?Goal status: ONGOING ?  ?2.  Pt will increase RUE P/ROM to [facility] to improve ability to complete  dressing and bathing tasks with minimal compensatory strategies.  ?  ?Goal status: ONGOING ?  ?3.  Pt will increase RUE strength to 3/5 or greater to improve ability to reach for items at waist to chest heig

## 2021-05-23 DIAGNOSIS — Z4789 Encounter for other orthopedic aftercare: Secondary | ICD-10-CM | POA: Diagnosis not present

## 2021-05-26 ENCOUNTER — Encounter (HOSPITAL_COMMUNITY): Payer: Self-pay

## 2021-05-26 ENCOUNTER — Ambulatory Visit (HOSPITAL_COMMUNITY): Payer: Medicare HMO

## 2021-05-26 DIAGNOSIS — M25511 Pain in right shoulder: Secondary | ICD-10-CM | POA: Diagnosis not present

## 2021-05-26 DIAGNOSIS — M25611 Stiffness of right shoulder, not elsewhere classified: Secondary | ICD-10-CM | POA: Diagnosis not present

## 2021-05-26 DIAGNOSIS — R29898 Other symptoms and signs involving the musculoskeletal system: Secondary | ICD-10-CM | POA: Diagnosis not present

## 2021-05-26 NOTE — Therapy (Addendum)
?OUTPATIENT OCCUPATIONAL THERAPY TREATMENT NOTE ? ? ? ?Patient Name: Ruth [name] ?MRN: [id number] ?DOB:03/30/1940, 81 y.o., female ?Today's Date: 05/26/2021 ? ?PCP: Kathyrn Drown, MD ?REFERRING PROVIDER: Sydnee Cabal, MD ? ?END OF SESSION:  ? ? ? 05/26/21 1408  ?OT Visits / Re-Eval  ?Visit Number 3  ?Number of Visits 16  ?Date for OT Re-Evaluation 07/13/21 ?(mini-reassessment 06/12/2021)  ?Authorization  ?Authorization Type [employer]  ?Authorization Time Period requesting 12 visits; 12 visits approved  ?Authorization - Visit Number 2  ?Authorization - Number of Visits 12  ?Progress Note Due on Visit 10  ?OT Time Calculation  ?OT Start Time 1300  ?OT Stop Time 1338  ?OT Time Calculation (min) 38 min  ?End of Session  ?Activity Tolerance Patient tolerated treatment well  ?Behavior During Therapy [facility] for tasks assessed/performed  ? ? ?Past Medical History:  ?Diagnosis Date  ? Anemia   ? Anxiety   ? Arthritis   ? Asthma   ? Depression   ? Eczema   ? Fatty liver   ? Fibromyalgia   ? GERD (gastroesophageal reflux disease)   ? EGD/ colon 1/09  ? H pylori ulcer 1980-1990  ? s/p treatment  ? History of kidney stones   ? Hyperlipidemia   ? IBS (irritable bowel syndrome)   ? Kidney cysts   ? Laryngospasm   ? Neck pain   ? OSA (obstructive sleep apnea)   ? Pneumonia 2013  ? PONV (postoperative nausea and vomiting)   ? Pre-diabetes   ? Refusal of blood transfusions as patient is Jehovah's Witness   ? ?Past Surgical History:  ?Procedure Laterality Date  ? ABDOMINAL HYSTERECTOMY  1970  ? BILATERAL SALPINGOOPHORECTOMY  1990s  ? BIOPSY  09/11/2020  ? Procedure: BIOPSY;  Surgeon: Rogene Houston, MD;  [facility];  Service: Endoscopy;;  gastric  ? CARDIAC CATHETERIZATION  2002  ? [facility]) normal coronary arteries   ? COLONOSCOPY  01/2007  ? Dr. Meriel Flavors  ? COLONOSCOPY N/A 04/18/2015  ? Procedure: COLONOSCOPY;  Surgeon: Rogene Houston, MD;  [facility];  Service: Endoscopy;  Laterality: N/A;   830 - moved to 8:55 - Ann to notify pt  ? COLONOSCOPY WITH ESOPHAGOGASTRODUODENOSCOPY (EGD)  02/16/2012  ? Procedure: COLONOSCOPY WITH ESOPHAGOGASTRODUODENOSCOPY (EGD);  Surgeon: Daneil Dolin, MD;  [facility];  Service: Endoscopy;  Laterality: N/A;  8:45  ? COLONOSCOPY WITH PROPOFOL N/A 09/11/2020  ? Procedure: COLONOSCOPY WITH PROPOFOL;  Surgeon: Rogene Houston, MD;  [facility];  Service: Endoscopy;  Laterality: N/A;  10:55  ? ESOPHAGEAL DILATION N/A 06/07/2014  ? Procedure: ESOPHAGEAL DILATION;  Surgeon: Rogene Houston, MD;  [facility];  Service: Endoscopy;  Laterality: N/A;  ? ESOPHAGEAL DILATION N/A 09/11/2020  ? Procedure: ESOPHAGEAL DILATION;  Surgeon: Rogene Houston, MD;  [facility];  Service: Endoscopy;  Laterality: N/A;  ? ESOPHAGOGASTRODUODENOSCOPY  01/2007  ? Dr Sharlett Iles- 3 cm hiatal hernia, benign esophageal biopsies, erosive esophagitis, gastritis  ? ESOPHAGOGASTRODUODENOSCOPY N/A 06/07/2014  ? Procedure: ESOPHAGOGASTRODUODENOSCOPY (EGD);  Surgeon: Rogene Houston, MD;  [facility];  Service: Endoscopy;  Laterality: N/A;  1200  ? ESOPHAGOGASTRODUODENOSCOPY N/A 08/18/2017  ? Procedure: ESOPHAGOGASTRODUODENOSCOPY (EGD);  Surgeon: Rogene Houston, MD;  [facility];  Service: Endoscopy;  Laterality: N/A;  2:20  ? ESOPHAGOGASTRODUODENOSCOPY (EGD) WITH PROPOFOL N/A 09/11/2020  ? Procedure: ESOPHAGOGASTRODUODENOSCOPY (EGD) WITH PROPOFOL;  Surgeon: Rogene Houston, MD;  [facility];  Service: Endoscopy;  Laterality: N/A;  ? FOOT SURGERY Left 2005  ? hammer toe  ? INNER EAR SURGERY    ? Left  ? KNEE SURGERY Left   ? x 2  ? POLYPECTOMY  09/11/2020  ? Procedure: POLYPECTOMY;  Surgeon: Rogene Houston, MD;  [facility];  Service: Endoscopy;;  ? SHOULDER ARTHROSCOPY WITH ROTATOR CUFF REPAIR Right 05/07/2021  ? Procedure: SHOULDER ARTHROSCOPY WITH MANIPULATION UNDER ANESTHESIA ,SUBACROMIAL DECOMPRESSION,  DISTAL CLAVICLE RESECTION, ROTATOR CUFF REPAIR , BICEPS TENOTOMY;  Surgeon: Sydnee Cabal, MD;  [facility];  Service: Orthopedics;  Laterality: Right;  WITH INTERSCALENE BLOCK ?NEEDS PRE OP CONSULT  WITH ANESTHESIA ?120  ? SHOULDER SURGERY Left 2011  ? TONSILLECTOMY    ? age 68  ? TOTAL KNEE ARTHROPLASTY Left 02/07/2018  ? Procedure: TOTAL KNEE ARTHROPLASTY;  Surgeon: Gaynelle Arabian, MD;  [facility]: [facility];  Service: Orthopedics;  Laterality: Left;  15mn  ? TUBAL LIGATION  1970  ? with an appy  ? TYMPANOSTOMY TUBE PLACEMENT  1984  ? ?Patient Active Problem List  ? Diagnosis Date Noted  ? Pain in joint of right shoulder 07/09/2020  ? Body mass index (BMI) 35.0-35.9, adult 06/06/2019  ? Cervical spondylosis with radiculopathy 06/06/2019  ? MDD (major depressive disorder), recurrent, in partial remission ([place] 03/27/2019  ? Depression, major, single episode, mild ([place] 03/07/2018  ? History of total knee replacement, left 02/21/2018  ? OA (osteoarthritis) of knee 02/07/2018  ? Non-allergic rhinitis 12/16/2017  ? Allergy to metal 12/16/2017  ? Pain in right foot 09/21/2017  ? Primary osteoarthritis of both feet 04/29/2017  ? Osteoarthritis of foot joint 04/29/2017  ? Hammer toe 04/23/2017  ? Family history of premature coronary heart disease 04/15/2016  ? Primary osteoarthritis of both knees 12/31/2015  ? Vulvodynia 08/03/2014  ? Anxiety disorder 07/06/2014  ? Fibromyalgia 04/25/2014  ? Prediabetes 03/18/2014  ? Hyperlipidemia 03/18/2014  ? Osteopenia 03/06/2014  ? History of cardiac catheterization 04/28/2013  ? GERD (gastroesophageal reflux disease) 02/02/2012  ? Indigestion 02/02/2012  ? Esophageal dysphagia 02/05/2011  ? CHEST PAIN 02/11/2009  ? IRRITABLE BOWEL SYNDROME 02/15/2007  ? Primary osteoarthritis of both hands 02/15/2007  ? Degenerative joint disease of hand 02/15/2007  ? GASTRITIS 02/14/2007  ? ? ?ONSET DATE: 05/07/21 ? ?REFERRING DIAG: s/p right shoulder arthroscopy with biceps tenotomy ? ?THERAPY  DIAG:  ?Acute pain of right shoulder ? ? ?PERTINENT HISTORY:  Pt is a 81y/o female s/p right shoulder arthroscopy with biceps tenotomy on 05/07/21. Pt reports MD told her she could remove her sling and use her arm for daily tasks as she is able. Pt was referred to occupational therapy for evaluation and treatment by Dr. RSydnee Cabal ? ?PRECAUTIONS: Shoulder, see standard protocol for bicep tenotomy.  ? ?SUBJECTIVE: S: It's not feeling that great today. ? ?PAIN:  ?Are you having pain? Yes: NPRS scale: 7/10 ?Pain location: Right shoulder ?Pain description: burning, sore, constant ?Aggravating factors: doing too much.  ?Relieving factors: Pain medication,ice, heat ? ? ? ? ?OBJECTIVE:  ?UE ROM    ?  ?Assessed seated, er/IR adducted ?Active ROM Right ?05/14/2021  ?Shoulder flexion 90  ?Shoulder abduction 50  ?Shoulder internal rotation 90  ?Shoulder external rotation 16  ?(Blank rows = not tested) ?  ?Assessed supine, er/IR adducted ?Passive ROM Right ?05/14/2021  ?Shoulder flexion 124  ?Shoulder abduction 58  ?Shoulder internal rotation 90  ?Shoulder external rotation 12  ?(Blank rows =  not tested) ?  ?  ?  ?UE MMT:    ?  ?Unable to test due to pain and precautions ?  ?MMT Right ?05/14/2021  ?Shoulder flexion    ?Shoulder abduction    ?Shoulder internal rotation    ?Shoulder external rotation    ?(Blank rows = not tested) ? ?FOTO: 36/100  ? ?TODAY'S TREATMENT: ?05/26/21 ?- Manual therapy: Completed prior to exercises. Myofascial release completed to right upper arm, upper trapezius, and scapularis region to decrease fascial restrictions.  ?- P/ROM: supine, all shoulder ranges, 5X ?- Therapy ball: flexion 10X with 2" hold at end stretch ? ? ? ? ? ? ?  ?GOALS: ?Goals reviewed with patient? Yes ?  ?SHORT TERM GOALS: Target date: 06/11/2021 ?  ?Pt will be provided with and educated on HEP to improve mobility of RUE required for use as dominant during ADL completion.  ?  ?Goal status: ONGOING ?  ?2.  Pt will increase RUE P/ROM  to [facility] to improve ability to complete dressing and bathing tasks with minimal compensatory strategies.  ?  ?Goal status: ONGOING ?  ?3.  Pt will increase RUE strength to 3/5 or greater to improve abili

## 2021-05-29 ENCOUNTER — Encounter (HOSPITAL_COMMUNITY): Payer: Self-pay | Admitting: Occupational Therapy

## 2021-05-29 ENCOUNTER — Ambulatory Visit (HOSPITAL_COMMUNITY): Payer: Medicare HMO | Admitting: Occupational Therapy

## 2021-05-29 DIAGNOSIS — M25511 Pain in right shoulder: Secondary | ICD-10-CM

## 2021-05-29 DIAGNOSIS — M25611 Stiffness of right shoulder, not elsewhere classified: Secondary | ICD-10-CM | POA: Diagnosis not present

## 2021-05-29 DIAGNOSIS — R29898 Other symptoms and signs involving the musculoskeletal system: Secondary | ICD-10-CM

## 2021-05-29 NOTE — Therapy (Signed)
?OUTPATIENT OCCUPATIONAL THERAPY TREATMENT NOTE ? ? ? ?Patient Name: Ruth [name] ?MRN: [id number] ?DOB:April 30, 1940, 81 y.o., female ?Today's Date: 05/29/2021 ? ?PCP: Kathyrn Drown, MD ?REFERRING PROVIDER: Sydnee Cabal, MD ? ?END OF SESSION:  ? OT End of Session - 05/29/21 1410   ? ? Visit Number 4   ? Number of Visits 16   ? Date for OT Re-Evaluation 07/13/21   mini-reassessment 06/12/2021  ? Authorization Type [employer]   ? Authorization Time Period requesting 12 visits; 12 visits approved   ? Authorization - Visit Number 3   ? Authorization - Number of Visits 12   ? Progress Note Due on Visit 10   ? OT Start Time 1335   ? OT Stop Time 1410   ? OT Time Calculation (min) 35 min   ? Activity Tolerance Patient tolerated treatment well   ? Behavior During Therapy [facility] for tasks assessed/performed   ? ?  ?  ? ?  ? ? ? ?Past Medical History:  ?Diagnosis Date  ? Anemia   ? Anxiety   ? Arthritis   ? Asthma   ? Depression   ? Eczema   ? Fatty liver   ? Fibromyalgia   ? GERD (gastroesophageal reflux disease)   ? EGD/ colon 1/09  ? H pylori ulcer 1980-1990  ? s/p treatment  ? History of kidney stones   ? Hyperlipidemia   ? IBS (irritable bowel syndrome)   ? Kidney cysts   ? Laryngospasm   ? Neck pain   ? OSA (obstructive sleep apnea)   ? Pneumonia 2013  ? PONV (postoperative nausea and vomiting)   ? Pre-diabetes   ? Refusal of blood transfusions as patient is Jehovah's Witness   ? ?Past Surgical History:  ?Procedure Laterality Date  ? ABDOMINAL HYSTERECTOMY  1970  ? BILATERAL SALPINGOOPHORECTOMY  1990s  ? BIOPSY  09/11/2020  ? Procedure: BIOPSY;  Surgeon: Rogene Houston, MD;  [facility];  Service: Endoscopy;;  gastric  ? CARDIAC CATHETERIZATION  2002  ? [facility]) normal coronary arteries   ? COLONOSCOPY  01/2007  ? Dr. Meriel Flavors  ? COLONOSCOPY N/A 04/18/2015  ? Procedure: COLONOSCOPY;  Surgeon: Rogene Houston, MD;  [facility];  Service: Endoscopy;  Laterality: N/A;  830 - moved to  8:55 - Ann to notify pt  ? COLONOSCOPY WITH ESOPHAGOGASTRODUODENOSCOPY (EGD)  02/16/2012  ? Procedure: COLONOSCOPY WITH ESOPHAGOGASTRODUODENOSCOPY (EGD);  Surgeon: Daneil Dolin, MD;  [facility];  Service: Endoscopy;  Laterality: N/A;  8:45  ? COLONOSCOPY WITH PROPOFOL N/A 09/11/2020  ? Procedure: COLONOSCOPY WITH PROPOFOL;  Surgeon: Rogene Houston, MD;  [facility];  Service: Endoscopy;  Laterality: N/A;  10:55  ? ESOPHAGEAL DILATION N/A 06/07/2014  ? Procedure: ESOPHAGEAL DILATION;  Surgeon: Rogene Houston, MD;  [facility];  Service: Endoscopy;  Laterality: N/A;  ? ESOPHAGEAL DILATION N/A 09/11/2020  ? Procedure: ESOPHAGEAL DILATION;  Surgeon: Rogene Houston, MD;  [facility];  Service: Endoscopy;  Laterality: N/A;  ? ESOPHAGOGASTRODUODENOSCOPY  01/2007  ? Dr Sharlett Iles- 3 cm hiatal hernia, benign esophageal biopsies, erosive esophagitis, gastritis  ? ESOPHAGOGASTRODUODENOSCOPY N/A 06/07/2014  ? Procedure: ESOPHAGOGASTRODUODENOSCOPY (EGD);  Surgeon: Rogene Houston, MD;  [facility];  Service: Endoscopy;  Laterality: N/A;  1200  ? ESOPHAGOGASTRODUODENOSCOPY N/A 08/18/2017  ? Procedure: ESOPHAGOGASTRODUODENOSCOPY (EGD);  Surgeon: Rogene Houston, MD;  [facility];  Service: Endoscopy;  Laterality: N/A;  2:20  ? ESOPHAGOGASTRODUODENOSCOPY (EGD) WITH PROPOFOL N/A 09/11/2020  ? Procedure: ESOPHAGOGASTRODUODENOSCOPY (EGD) WITH PROPOFOL;  Surgeon: Rogene Houston, MD;  [facility];  Service: Endoscopy;  Laterality: N/A;  ? FOOT SURGERY Left 2005  ? hammer toe  ? INNER EAR SURGERY    ? Left  ? KNEE SURGERY Left   ? x 2  ? POLYPECTOMY  09/11/2020  ? Procedure: POLYPECTOMY;  Surgeon: Rogene Houston, MD;  [facility];  Service: Endoscopy;;  ? SHOULDER ARTHROSCOPY WITH ROTATOR CUFF REPAIR Right 05/07/2021  ? Procedure: SHOULDER ARTHROSCOPY WITH MANIPULATION UNDER ANESTHESIA ,SUBACROMIAL DECOMPRESSION, DISTAL CLAVICLE  RESECTION, ROTATOR CUFF REPAIR , BICEPS TENOTOMY;  Surgeon: Sydnee Cabal, MD;  [facility];  Service: Orthopedics;  Laterality: Right;  WITH INTERSCALENE BLOCK ?NEEDS PRE OP CONSULT  WITH ANESTHESIA ?120  ? SHOULDER SURGERY Left 2011  ? TONSILLECTOMY    ? age 35  ? TOTAL KNEE ARTHROPLASTY Left 02/07/2018  ? Procedure: TOTAL KNEE ARTHROPLASTY;  Surgeon: Gaynelle Arabian, MD;  [facility];  Service: Orthopedics;  Laterality: Left;  39mn  ? TUBAL LIGATION  1970  ? with an appy  ? TYMPANOSTOMY TUBE PLACEMENT  1984  ? ?Patient Active Problem List  ? Diagnosis Date Noted  ? Pain in joint of right shoulder 07/09/2020  ? Body mass index (BMI) 35.0-35.9, adult 06/06/2019  ? Cervical spondylosis with radiculopathy 06/06/2019  ? MDD (major depressive disorder), recurrent, in partial remission ([place] 03/27/2019  ? Depression, major, single episode, mild ([place] 03/07/2018  ? History of total knee replacement, left 02/21/2018  ? OA (osteoarthritis) of knee 02/07/2018  ? Non-allergic rhinitis 12/16/2017  ? Allergy to metal 12/16/2017  ? Pain in right foot 09/21/2017  ? Primary osteoarthritis of both feet 04/29/2017  ? Osteoarthritis of foot joint 04/29/2017  ? Hammer toe 04/23/2017  ? Family history of premature coronary heart disease 04/15/2016  ? Primary osteoarthritis of both knees 12/31/2015  ? Vulvodynia 08/03/2014  ? Anxiety disorder 07/06/2014  ? Fibromyalgia 04/25/2014  ? Prediabetes 03/18/2014  ? Hyperlipidemia 03/18/2014  ? Osteopenia 03/06/2014  ? History of cardiac catheterization 04/28/2013  ? GERD (gastroesophageal reflux disease) 02/02/2012  ? Indigestion 02/02/2012  ? Esophageal dysphagia 02/05/2011  ? CHEST PAIN 02/11/2009  ? IRRITABLE BOWEL SYNDROME 02/15/2007  ? Primary osteoarthritis of both hands 02/15/2007  ? Degenerative joint disease of hand 02/15/2007  ? GASTRITIS 02/14/2007  ? ? ?ONSET DATE: 05/07/21 ? ?REFERRING DIAG: s/p right shoulder arthroscopy with biceps tenotomy ? ?THERAPY DIAG:  ?Acute  pain of right shoulder ? ? ?PERTINENT HISTORY:  Pt is a 81y/o female s/p right shoulder arthroscopy with biceps tenotomy on 05/07/21. Pt reports MD told her she could remove her sling and use her arm for daily tasks as she is able. Pt was referred to occupational therapy for evaluation and treatment by Dr. RSydnee Cabal ? ?PRECAUTIONS: Shoulder, see standard protocol for bicep tenotomy.  ? ?SUBJECTIVE: S: I've been having some stabbing pain.  ? ?PAIN:  ?Are you having pain? Yes: NPRS scale: 7/10 ?Pain location: Right shoulder ?Pain description: burning, sore, constant, occasional stabbing ?Aggravating factors: doing too much.  ?Relieving factors: Pain medication,ice, heat ? ? ? ? ?OBJECTIVE:  ?UE ROM    ?  ?Assessed seated, er/IR adducted ?Active ROM Right ?05/14/2021  ?Shoulder flexion 90  ?Shoulder abduction 50  ?Shoulder internal rotation 90  ?Shoulder external rotation 16  ?(Blank rows = not tested) ?  ?Assessed  supine, er/IR adducted ?Passive ROM Right ?05/14/2021  ?Shoulder flexion 124  ?Shoulder abduction 58  ?Shoulder internal rotation 90  ?Shoulder external rotation 12  ?(Blank rows = not tested) ?  ?  ?  ?UE MMT:    ?  ?Unable to test due to pain and precautions ?  ?MMT Right ?05/14/2021  ?Shoulder flexion    ?Shoulder abduction    ?Shoulder internal rotation    ?Shoulder external rotation    ?(Blank rows = not tested) ? ?FOTO: 36/100  ? ?TODAY'S TREATMENT: ?05/29/21 ?-Manual therapy: Completed prior to exercises. Myofascial release completed to right upper arm, upper trapezius, and scapularis region to decrease fascial restrictions.  ?-P/ROM: supine, all shoulder ranges, 10X ?-AA/ROM: supine, er/IR, 10X ?-Therapy ball: flexion 15X with 2" hold at end stretch ?-Pendulums: side to side, 1'  ? ? ? ?05/26/21 ?- Manual therapy: Completed prior to exercises. Myofascial release completed to right upper arm, upper trapezius, and scapularis region to decrease fascial restrictions.  ?- P/ROM: supine, all shoulder  ranges, 5X ?- Therapy ball: flexion 10X with 2" hold at end stretch ? ? ? ? ? ? ?  ?GOALS: ?Goals reviewed with patient? Yes ?  ?SHORT TERM GOALS: Target date: 06/11/2021 ?  ?Pt will be provided with and educated

## 2021-06-06 ENCOUNTER — Ambulatory Visit (HOSPITAL_COMMUNITY): Payer: Medicare HMO | Admitting: Occupational Therapy

## 2021-06-06 ENCOUNTER — Encounter (HOSPITAL_COMMUNITY): Payer: Self-pay | Admitting: Occupational Therapy

## 2021-06-06 DIAGNOSIS — M25611 Stiffness of right shoulder, not elsewhere classified: Secondary | ICD-10-CM | POA: Diagnosis not present

## 2021-06-06 DIAGNOSIS — R29898 Other symptoms and signs involving the musculoskeletal system: Secondary | ICD-10-CM | POA: Diagnosis not present

## 2021-06-06 DIAGNOSIS — M25511 Pain in right shoulder: Secondary | ICD-10-CM | POA: Diagnosis not present

## 2021-06-06 NOTE — Therapy (Signed)
?OUTPATIENT OCCUPATIONAL THERAPY TREATMENT NOTE ? ? ? ?Patient Name: Ruth [name] ?MRN: [id number] ?DOB:February 16, 1941, 81 y.o., female ?Today's Date: 06/06/2021 ? ?PCP: Kathyrn Drown, MD ?REFERRING PROVIDER: Sydnee Cabal, MD ? ?END OF SESSION:  ? OT End of Session - 06/06/21 1517   ? ? Visit Number 5   ? Number of Visits 16   ? Date for OT Re-Evaluation 07/13/21   mini-reassessment 06/12/2021  ? Authorization Type [employer]   ? Authorization Time Period 12 visits approved 05/14/2021-07/09/2021   ? Authorization - Visit Number 4   ? Authorization - Number of Visits 12   ? Progress Note Due on Visit 10   ? OT Start Time 1431   ? OT Stop Time 1512   ? OT Time Calculation (min) 41 min   ? Activity Tolerance Patient tolerated treatment well   ? Behavior During Therapy [facility] for tasks assessed/performed   ? ?  ?  ? ?  ? ? ? ? ?Past Medical History:  ?Diagnosis Date  ? Anemia   ? Anxiety   ? Arthritis   ? Asthma   ? Depression   ? Eczema   ? Fatty liver   ? Fibromyalgia   ? GERD (gastroesophageal reflux disease)   ? EGD/ colon 1/09  ? H pylori ulcer 1980-1990  ? s/p treatment  ? History of kidney stones   ? Hyperlipidemia   ? IBS (irritable bowel syndrome)   ? Kidney cysts   ? Laryngospasm   ? Neck pain   ? OSA (obstructive sleep apnea)   ? Pneumonia 2013  ? PONV (postoperative nausea and vomiting)   ? Pre-diabetes   ? Refusal of blood transfusions as patient is Jehovah's Witness   ? ?Past Surgical History:  ?Procedure Laterality Date  ? ABDOMINAL HYSTERECTOMY  1970  ? BILATERAL SALPINGOOPHORECTOMY  1990s  ? BIOPSY  09/11/2020  ? Procedure: BIOPSY;  Surgeon: Rogene Houston, MD;  [facility];  Service: Endoscopy;;  gastric  ? CARDIAC CATHETERIZATION  2002  ? [facility]) normal coronary arteries   ? COLONOSCOPY  01/2007  ? Dr. Meriel Flavors  ? COLONOSCOPY N/A 04/18/2015  ? Procedure: COLONOSCOPY;  Surgeon: Rogene Houston, MD;  [facility];  Service: Endoscopy;  Laterality: N/A;  830 - moved to  8:55 - Ann to notify pt  ? COLONOSCOPY WITH ESOPHAGOGASTRODUODENOSCOPY (EGD)  02/16/2012  ? Procedure: COLONOSCOPY WITH ESOPHAGOGASTRODUODENOSCOPY (EGD);  Surgeon: Daneil Dolin, MD;  [facility];  Service: Endoscopy;  Laterality: N/A;  8:45  ? COLONOSCOPY WITH PROPOFOL N/A 09/11/2020  ? Procedure: COLONOSCOPY WITH PROPOFOL;  Surgeon: Rogene Houston, MD;  [facility];  Service: Endoscopy;  Laterality: N/A;  10:55  ? ESOPHAGEAL DILATION N/A 06/07/2014  ? Procedure: ESOPHAGEAL DILATION;  Surgeon: Rogene Houston, MD;  [facility];  Service: Endoscopy;  Laterality: N/A;  ? ESOPHAGEAL DILATION N/A 09/11/2020  ? Procedure: ESOPHAGEAL DILATION;  Surgeon: Rogene Houston, MD;  [facility];  Service: Endoscopy;  Laterality: N/A;  ? ESOPHAGOGASTRODUODENOSCOPY  01/2007  ? Dr Sharlett Iles- 3 cm hiatal hernia, benign esophageal biopsies, erosive esophagitis, gastritis  ? ESOPHAGOGASTRODUODENOSCOPY N/A 06/07/2014  ? Procedure: ESOPHAGOGASTRODUODENOSCOPY (EGD);  Surgeon: Rogene Houston, MD;  [facility];  Service: Endoscopy;  Laterality: N/A;  1200  ? ESOPHAGOGASTRODUODENOSCOPY N/A 08/18/2017  ? Procedure: ESOPHAGOGASTRODUODENOSCOPY (EGD);  Surgeon: Rogene Houston, MD;  [facility];  Service: Endoscopy;  Laterality:  N/A;  2:20  ? ESOPHAGOGASTRODUODENOSCOPY (EGD) WITH PROPOFOL N/A 09/11/2020  ? Procedure: ESOPHAGOGASTRODUODENOSCOPY (EGD) WITH PROPOFOL;  Surgeon: Rogene Houston, MD;  [facility];  Service: Endoscopy;  Laterality: N/A;  ? FOOT SURGERY Left 2005  ? hammer toe  ? INNER EAR SURGERY    ? Left  ? KNEE SURGERY Left   ? x 2  ? POLYPECTOMY  09/11/2020  ? Procedure: POLYPECTOMY;  Surgeon: Rogene Houston, MD;  [facility];  Service: Endoscopy;;  ? SHOULDER ARTHROSCOPY WITH ROTATOR CUFF REPAIR Right 05/07/2021  ? Procedure: SHOULDER ARTHROSCOPY WITH MANIPULATION UNDER ANESTHESIA ,SUBACROMIAL DECOMPRESSION, DISTAL CLAVICLE  RESECTION, ROTATOR CUFF REPAIR , BICEPS TENOTOMY;  Surgeon: Sydnee Cabal, MD;  [facility];  Service: Orthopedics;  Laterality: Right;  WITH INTERSCALENE BLOCK ?NEEDS PRE OP CONSULT  WITH ANESTHESIA ?120  ? SHOULDER SURGERY Left 2011  ? TONSILLECTOMY    ? age 13  ? TOTAL KNEE ARTHROPLASTY Left 02/07/2018  ? Procedure: TOTAL KNEE ARTHROPLASTY;  Surgeon: Gaynelle Arabian, MD;  [facility];  Service: Orthopedics;  Laterality: Left;  37mn  ? TUBAL LIGATION  1970  ? with an appy  ? TYMPANOSTOMY TUBE PLACEMENT  1984  ? ?Patient Active Problem List  ? Diagnosis Date Noted  ? Pain in joint of right shoulder 07/09/2020  ? Body mass index (BMI) 35.0-35.9, adult 06/06/2019  ? Cervical spondylosis with radiculopathy 06/06/2019  ? MDD (major depressive disorder), recurrent, in partial remission ([place] 03/27/2019  ? Depression, major, single episode, mild ([place] 03/07/2018  ? History of total knee replacement, left 02/21/2018  ? OA (osteoarthritis) of knee 02/07/2018  ? Non-allergic rhinitis 12/16/2017  ? Allergy to metal 12/16/2017  ? Pain in right foot 09/21/2017  ? Primary osteoarthritis of both feet 04/29/2017  ? Osteoarthritis of foot joint 04/29/2017  ? Hammer toe 04/23/2017  ? Family history of premature coronary heart disease 04/15/2016  ? Primary osteoarthritis of both knees 12/31/2015  ? Vulvodynia 08/03/2014  ? Anxiety disorder 07/06/2014  ? Fibromyalgia 04/25/2014  ? Prediabetes 03/18/2014  ? Hyperlipidemia 03/18/2014  ? Osteopenia 03/06/2014  ? History of cardiac catheterization 04/28/2013  ? GERD (gastroesophageal reflux disease) 02/02/2012  ? Indigestion 02/02/2012  ? Esophageal dysphagia 02/05/2011  ? CHEST PAIN 02/11/2009  ? IRRITABLE BOWEL SYNDROME 02/15/2007  ? Primary osteoarthritis of both hands 02/15/2007  ? Degenerative joint disease of hand 02/15/2007  ? GASTRITIS 02/14/2007  ? ? ?ONSET DATE: 05/07/21 ? ?REFERRING DIAG: s/p right shoulder arthroscopy with biceps tenotomy ? ?THERAPY DIAG:  ?Acute  pain of right shoulder ? ? ?PERTINENT HISTORY:  Pt is a 81y/o female s/p right shoulder arthroscopy with biceps tenotomy on 05/07/21. Pt reports MD told her she could remove her sling and use her arm for daily tasks as she is able. Pt was referred to occupational therapy for evaluation and treatment by Dr. RSydnee Cabal ? ?PRECAUTIONS: Shoulder, see standard protocol for bicep tenotomy.  ? ?SUBJECTIVE: S: "Im okay, I am still having some pain". "I have continued to do my therapies".  ? ?PAIN:  ?Are you having pain? Yes: NPRS scale: 7/10 ?Pain location: Right shoulder ?Pain description: achy and sore  ?Aggravating factors: doing too much.  ?Relieving factors: Pain medication,ice, heat ? ? ? ? ?OBJECTIVE:  ?UE ROM    ?  ?Assessed seated, er/IR adducted ?Active ROM Right ?05/14/2021  ?Shoulder flexion 90  ?Shoulder abduction 50  ?Shoulder internal rotation 90  ?Shoulder external rotation 16  ?(Blank  rows = not tested) ?  ?Assessed supine, er/IR adducted ?Passive ROM Right ?05/14/2021  ?Shoulder flexion 124  ?Shoulder abduction 58  ?Shoulder internal rotation 90  ?Shoulder external rotation 12  ?(Blank rows = not tested) ?  ?  ?  ?UE MMT:    ?  ?Unable to test due to pain and precautions ?  ?MMT Right ?05/14/2021  ?Shoulder flexion    ?Shoulder abduction    ?Shoulder internal rotation    ?Shoulder external rotation    ?(Blank rows = not tested) ? ?FOTO: 36/100  ? ?TODAY'S TREATMENT: ? ?06/06/2021 ?-Manual therapy: Manual therapy: Completed prior to exercises. Myofascial release completed to right upper arm, upper trapezius, and scapularis region to decrease fascial restrictions.  ?-P/ROM: supine, all shoulder ranges 10x  ?-AA/ROM: supine, all shoulder directions 10x (with rod) sitting edge of chair: ER/IR, flex./ext.  ?-Pulleys: shoulder flexion 1 min, shoulder abduction 1 min  ? ? ? ?05/29/21 ?-Manual therapy: Completed prior to exercises. Myofascial release completed to right upper arm, upper trapezius, and scapularis  region to decrease fascial restrictions.  ?-P/ROM: supine, all shoulder ranges, 10X ?-AA/ROM: supine, er/IR, 10X ?-Therapy ball: flexion 15X with 2" hold at end stretch ?-Pendulums: side to side, 1'  ? ? ? ?4/

## 2021-06-06 NOTE — Patient Instructions (Signed)

## 2021-06-10 ENCOUNTER — Ambulatory Visit (HOSPITAL_COMMUNITY): Payer: Medicare HMO

## 2021-06-10 ENCOUNTER — Encounter (HOSPITAL_COMMUNITY): Payer: Self-pay

## 2021-06-10 DIAGNOSIS — M25611 Stiffness of right shoulder, not elsewhere classified: Secondary | ICD-10-CM

## 2021-06-10 DIAGNOSIS — M25511 Pain in right shoulder: Secondary | ICD-10-CM

## 2021-06-10 DIAGNOSIS — R29898 Other symptoms and signs involving the musculoskeletal system: Secondary | ICD-10-CM | POA: Diagnosis not present

## 2021-06-10 NOTE — Therapy (Signed)
?OUTPATIENT OCCUPATIONAL THERAPY TREATMENT NOTE ? ? ? ?Patient Name: Ruth [name] ?MRN: [id number] ?DOB:September 21, 1940, 81 y.o., female ?Today's Date: 06/10/2021 ? ?PCP: Kathyrn Drown, MD ?REFERRING PROVIDER: Sydnee Cabal, MD ? ?END OF SESSION:  ? OT End of Session - 06/10/21 1539   ? ? Visit Number 6   ? Number of Visits 16   ? Date for OT Re-Evaluation 07/13/21   mini-reassessment 06/12/2021  ? Authorization Type [employer]   ? Authorization Time Period 12 visits approved 05/14/2021-07/09/2021   ? Authorization - Visit Number 5   ? Authorization - Number of Visits 12   ? Progress Note Due on Visit 10   ? OT Start Time 5102   ? OT Stop Time 1556   ? OT Time Calculation (min) 38 min   ? Activity Tolerance Patient tolerated treatment well   ? Behavior During Therapy [facility] for tasks assessed/performed   ? ?  ?  ? ?  ? ? ? ? ? ?Past Medical History:  ?Diagnosis Date  ? Anemia   ? Anxiety   ? Arthritis   ? Asthma   ? Depression   ? Eczema   ? Fatty liver   ? Fibromyalgia   ? GERD (gastroesophageal reflux disease)   ? EGD/ colon 1/09  ? H pylori ulcer 1980-1990  ? s/p treatment  ? History of kidney stones   ? Hyperlipidemia   ? IBS (irritable bowel syndrome)   ? Kidney cysts   ? Laryngospasm   ? Neck pain   ? OSA (obstructive sleep apnea)   ? Pneumonia 2013  ? PONV (postoperative nausea and vomiting)   ? Pre-diabetes   ? Refusal of blood transfusions as patient is Jehovah's Witness   ? ?Past Surgical History:  ?Procedure Laterality Date  ? ABDOMINAL HYSTERECTOMY  1970  ? BILATERAL SALPINGOOPHORECTOMY  1990s  ? BIOPSY  09/11/2020  ? Procedure: BIOPSY;  Surgeon: Rogene Houston, MD;  [facility];  Service: Endoscopy;;  gastric  ? CARDIAC CATHETERIZATION  2002  ? [facility]) normal coronary arteries   ? COLONOSCOPY  01/2007  ? Dr. Meriel Flavors  ? COLONOSCOPY N/A 04/18/2015  ? Procedure: COLONOSCOPY;  Surgeon: Rogene Houston, MD;  [facility];  Service: Endoscopy;  Laterality: N/A;  830 - moved to  8:55 - Ann to notify pt  ? COLONOSCOPY WITH ESOPHAGOGASTRODUODENOSCOPY (EGD)  02/16/2012  ? Procedure: COLONOSCOPY WITH ESOPHAGOGASTRODUODENOSCOPY (EGD);  Surgeon: Daneil Dolin, MD;  [facility];  Service: Endoscopy;  Laterality: N/A;  8:45  ? COLONOSCOPY WITH PROPOFOL N/A 09/11/2020  ? Procedure: COLONOSCOPY WITH PROPOFOL;  Surgeon: Rogene Houston, MD;  [facility];  Service: Endoscopy;  Laterality: N/A;  10:55  ? ESOPHAGEAL DILATION N/A 06/07/2014  ? Procedure: ESOPHAGEAL DILATION;  Surgeon: Rogene Houston, MD;  [facility];  Service: Endoscopy;  Laterality: N/A;  ? ESOPHAGEAL DILATION N/A 09/11/2020  ? Procedure: ESOPHAGEAL DILATION;  Surgeon: Rogene Houston, MD;  [facility];  Service: Endoscopy;  Laterality: N/A;  ? ESOPHAGOGASTRODUODENOSCOPY  01/2007  ? Dr Sharlett Iles- 3 cm hiatal hernia, benign esophageal biopsies, erosive esophagitis, gastritis  ? ESOPHAGOGASTRODUODENOSCOPY N/A 06/07/2014  ? Procedure: ESOPHAGOGASTRODUODENOSCOPY (EGD);  Surgeon: Rogene Houston, MD;  [facility];  Service: Endoscopy;  Laterality: N/A;  1200  ? ESOPHAGOGASTRODUODENOSCOPY N/A 08/18/2017  ? Procedure: ESOPHAGOGASTRODUODENOSCOPY (EGD);  Surgeon: Rogene Houston, MD;  [facility];  Service: Endoscopy;  Laterality: N/A;  2:20  ? ESOPHAGOGASTRODUODENOSCOPY (EGD) WITH PROPOFOL N/A 09/11/2020  ? Procedure: ESOPHAGOGASTRODUODENOSCOPY (EGD) WITH PROPOFOL;  Surgeon: Rogene Houston, MD;  [facility];  Service: Endoscopy;  Laterality: N/A;  ? FOOT SURGERY Left 2005  ? hammer toe  ? INNER EAR SURGERY    ? Left  ? KNEE SURGERY Left   ? x 2  ? POLYPECTOMY  09/11/2020  ? Procedure: POLYPECTOMY;  Surgeon: Rogene Houston, MD;  [facility];  Service: Endoscopy;;  ? SHOULDER ARTHROSCOPY WITH ROTATOR CUFF REPAIR Right 05/07/2021  ? Procedure: SHOULDER ARTHROSCOPY WITH MANIPULATION UNDER ANESTHESIA ,SUBACROMIAL DECOMPRESSION, DISTAL CLAVICLE  RESECTION, ROTATOR CUFF REPAIR , BICEPS TENOTOMY;  Surgeon: Sydnee Cabal, MD;  [facility];  Service: Orthopedics;  Laterality: Right;  WITH INTERSCALENE BLOCK ?NEEDS PRE OP CONSULT  WITH ANESTHESIA ?120  ? SHOULDER SURGERY Left 2011  ? TONSILLECTOMY    ? age 89  ? TOTAL KNEE ARTHROPLASTY Left 02/07/2018  ? Procedure: TOTAL KNEE ARTHROPLASTY;  Surgeon: Gaynelle Arabian, MD;  [facility];  Service: Orthopedics;  Laterality: Left;  70mn  ? TUBAL LIGATION  1970  ? with an appy  ? TYMPANOSTOMY TUBE PLACEMENT  1984  ? ?Patient Active Problem List  ? Diagnosis Date Noted  ? Pain in joint of right shoulder 07/09/2020  ? Body mass index (BMI) 35.0-35.9, adult 06/06/2019  ? Cervical spondylosis with radiculopathy 06/06/2019  ? MDD (major depressive disorder), recurrent, in partial remission ([place] 03/27/2019  ? Depression, major, single episode, mild ([place] 03/07/2018  ? History of total knee replacement, left 02/21/2018  ? OA (osteoarthritis) of knee 02/07/2018  ? Non-allergic rhinitis 12/16/2017  ? Allergy to metal 12/16/2017  ? Pain in right foot 09/21/2017  ? Primary osteoarthritis of both feet 04/29/2017  ? Osteoarthritis of foot joint 04/29/2017  ? Hammer toe 04/23/2017  ? Family history of premature coronary heart disease 04/15/2016  ? Primary osteoarthritis of both knees 12/31/2015  ? Vulvodynia 08/03/2014  ? Anxiety disorder 07/06/2014  ? Fibromyalgia 04/25/2014  ? Prediabetes 03/18/2014  ? Hyperlipidemia 03/18/2014  ? Osteopenia 03/06/2014  ? History of cardiac catheterization 04/28/2013  ? GERD (gastroesophageal reflux disease) 02/02/2012  ? Indigestion 02/02/2012  ? Esophageal dysphagia 02/05/2011  ? CHEST PAIN 02/11/2009  ? IRRITABLE BOWEL SYNDROME 02/15/2007  ? Primary osteoarthritis of both hands 02/15/2007  ? Degenerative joint disease of hand 02/15/2007  ? GASTRITIS 02/14/2007  ? ? ?ONSET DATE: 05/07/21 ? ?REFERRING DIAG: s/p right shoulder arthroscopy with biceps tenotomy ? ?THERAPY DIAG:  ?Acute  pain of right shoulder ? ? ?PERTINENT HISTORY:  Pt is a 81y/o female s/p right shoulder arthroscopy with biceps tenotomy on 05/07/21. Pt reports MD told her she could remove her sling and use her arm for daily tasks as she is able. Pt was referred to occupational therapy for evaluation and treatment by Dr. RSydnee Cabal ? ?PRECAUTIONS: Shoulder, see standard protocol for bicep tenotomy.  ? ?SUBJECTIVE: S: I'm not sure if I'll be able to do anything today. I'm in a lot of pain. It started on Monday. ? ?PAIN:  ?Are you having pain? Yes: NPRS scale: 8/10 ?Pain location: Right shoulder (anterior, posterior, superior) ?Pain description: achy,sore, constant ?Aggravating factors: doing too much.  ?Relieving factors: Pain medication (1/2 pill taken before session), tried vibration ? ? ? ? ?OBJECTIVE:  ?UE ROM    ?  ?Assessed seated, er/IR adducted ?Active ROM Right ?05/14/2021  ?Shoulder flexion 90  ?Shoulder abduction  50  ?Shoulder internal rotation 90  ?Shoulder external rotation 16  ?(Blank rows = not tested) ?  ?Assessed supine, er/IR adducted ?Passive ROM Right ?05/14/2021  ?Shoulder flexion 124  ?Shoulder abduction 58  ?Shoulder internal rotation 90  ?Shoulder external rotation 12  ?(Blank rows = not tested) ?  ?  ?  ?UE MMT:    ?  ?Unable to test due to pain and precautions ?  ?MMT Right ?05/14/2021  ?Shoulder flexion    ?Shoulder abduction    ?Shoulder internal rotation    ?Shoulder external rotation    ?(Blank rows = not tested) ? ?FOTO: 36/100  ? ?TODAY'S TREATMENT: ?06/10/21 ?- ES and moist heat: right shoulder, 10' 5.8 CV to decrease pain. ?- P/ROM: supine, shoulder, 10X all shoulder ranges. ? ? ?06/06/2021 ?-Manual therapy: Manual therapy: Completed prior to exercises. Myofascial release completed to right upper arm, upper trapezius, and scapularis region to decrease fascial restrictions.  ?-P/ROM: supine, all shoulder ranges 10x  ?-AA/ROM: supine, all shoulder directions 10x (with rod) sitting edge of chair:  ER/IR, flex./ext.  ?-Pulleys: shoulder flexion 1 min, shoulder abduction 1 min  ? ? ? ?05/29/21 ?-Manual therapy: Completed prior to exercises. Myofascial release completed to right upper arm, upper trapezius,

## 2021-06-13 ENCOUNTER — Encounter (HOSPITAL_COMMUNITY): Payer: Medicare HMO

## 2021-06-16 ENCOUNTER — Encounter (HOSPITAL_COMMUNITY): Payer: Self-pay

## 2021-06-16 ENCOUNTER — Ambulatory Visit (HOSPITAL_COMMUNITY): Payer: Medicare HMO | Attending: Specialist

## 2021-06-16 DIAGNOSIS — M25511 Pain in right shoulder: Secondary | ICD-10-CM | POA: Insufficient documentation

## 2021-06-16 DIAGNOSIS — R29898 Other symptoms and signs involving the musculoskeletal system: Secondary | ICD-10-CM | POA: Diagnosis not present

## 2021-06-16 DIAGNOSIS — M25611 Stiffness of right shoulder, not elsewhere classified: Secondary | ICD-10-CM | POA: Diagnosis not present

## 2021-06-16 NOTE — Therapy (Signed)
?OUTPATIENT OCCUPATIONAL THERAPY TREATMENT NOTE ? ? ? ?Patient Name: Ruth [name] ?MRN: [id number] ?DOB:Jul 10, 1940, 81 y.o., female ?Today's Date: 06/16/2021 ? ?PCP: Kathyrn Drown, MD ?REFERRING PROVIDER: Sydnee Cabal, MD ? ?END OF SESSION:  ? OT End of Session - 06/16/21 1348   ? ? Visit Number 7   ? Number of Visits 16   ? Date for OT Re-Evaluation 07/13/21   ? Authorization Type [employer]   ? Authorization Time Period 12 visits approved 05/14/2021-07/09/2021   ? Authorization - Visit Number 6   ? Authorization - Number of Visits 12   ? Progress Note Due on Visit 10   ? OT Start Time 1348   ? OT Stop Time 1426   ? OT Time Calculation (min) 38 min   ? Activity Tolerance Patient tolerated treatment well   ? Behavior During Therapy [facility] for tasks assessed/performed   ? ?  ?  ? ?  ? ? ? ? ? ? ?Past Medical History:  ?Diagnosis Date  ? Anemia   ? Anxiety   ? Arthritis   ? Asthma   ? Depression   ? Eczema   ? Fatty liver   ? Fibromyalgia   ? GERD (gastroesophageal reflux disease)   ? EGD/ colon 1/09  ? H pylori ulcer 1980-1990  ? s/p treatment  ? History of kidney stones   ? Hyperlipidemia   ? IBS (irritable bowel syndrome)   ? Kidney cysts   ? Laryngospasm   ? Neck pain   ? OSA (obstructive sleep apnea)   ? Pneumonia 2013  ? PONV (postoperative nausea and vomiting)   ? Pre-diabetes   ? Refusal of blood transfusions as patient is Jehovah's Witness   ? ?Past Surgical History:  ?Procedure Laterality Date  ? ABDOMINAL HYSTERECTOMY  1970  ? BILATERAL SALPINGOOPHORECTOMY  1990s  ? BIOPSY  09/11/2020  ? Procedure: BIOPSY;  Surgeon: Rogene Houston, MD;  [facility];  Service: Endoscopy;;  gastric  ? CARDIAC CATHETERIZATION  2002  ? [facility]) normal coronary arteries   ? COLONOSCOPY  01/2007  ? Dr. Meriel Flavors  ? COLONOSCOPY N/A 04/18/2015  ? Procedure: COLONOSCOPY;  Surgeon: Rogene Houston, MD;  [facility];  Service: Endoscopy;  Laterality: N/A;  830 - moved to 8:55 - Ann to notify pt  ?  COLONOSCOPY WITH ESOPHAGOGASTRODUODENOSCOPY (EGD)  02/16/2012  ? Procedure: COLONOSCOPY WITH ESOPHAGOGASTRODUODENOSCOPY (EGD);  Surgeon: Daneil Dolin, MD;  [facility];  Service: Endoscopy;  Laterality: N/A;  8:45  ? COLONOSCOPY WITH PROPOFOL N/A 09/11/2020  ? Procedure: COLONOSCOPY WITH PROPOFOL;  Surgeon: Rogene Houston, MD;  [facility];  Service: Endoscopy;  Laterality: N/A;  10:55  ? ESOPHAGEAL DILATION N/A 06/07/2014  ? Procedure: ESOPHAGEAL DILATION;  Surgeon: Rogene Houston, MD;  [facility];  Service: Endoscopy;  Laterality: N/A;  ? ESOPHAGEAL DILATION N/A 09/11/2020  ? Procedure: ESOPHAGEAL DILATION;  Surgeon: Rogene Houston, MD;  [facility];  Service: Endoscopy;  Laterality: N/A;  ? ESOPHAGOGASTRODUODENOSCOPY  01/2007  ? Dr Sharlett Iles- 3 cm hiatal hernia, benign esophageal biopsies, erosive esophagitis, gastritis  ? ESOPHAGOGASTRODUODENOSCOPY N/A 06/07/2014  ? Procedure: ESOPHAGOGASTRODUODENOSCOPY (EGD);  Surgeon: Rogene Houston, MD;  [facility];  Service: Endoscopy;  Laterality: N/A;  1200  ? ESOPHAGOGASTRODUODENOSCOPY N/A 08/18/2017  ? Procedure: ESOPHAGOGASTRODUODENOSCOPY (EGD);  Surgeon: Rogene Houston, MD;  [facility];  Service: Endoscopy;  Laterality: N/A;  2:20  ? ESOPHAGOGASTRODUODENOSCOPY (EGD) WITH PROPOFOL N/A 09/11/2020  ? Procedure: ESOPHAGOGASTRODUODENOSCOPY (EGD) WITH PROPOFOL;  Surgeon: Rogene Houston, MD;  [facility];  Service: Endoscopy;  Laterality: N/A;  ? FOOT SURGERY Left 2005  ? hammer toe  ? INNER EAR SURGERY    ? Left  ? KNEE SURGERY Left   ? x 2  ? POLYPECTOMY  09/11/2020  ? Procedure: POLYPECTOMY;  Surgeon: Rogene Houston, MD;  [facility];  Service: Endoscopy;;  ? SHOULDER ARTHROSCOPY WITH ROTATOR CUFF REPAIR Right 05/07/2021  ? Procedure: SHOULDER ARTHROSCOPY WITH MANIPULATION UNDER ANESTHESIA ,SUBACROMIAL DECOMPRESSION, DISTAL CLAVICLE RESECTION, ROTATOR CUFF REPAIR  , BICEPS TENOTOMY;  Surgeon: Sydnee Cabal, MD;  [facility];  Service: Orthopedics;  Laterality: Right;  WITH INTERSCALENE BLOCK ?NEEDS PRE OP CONSULT  WITH ANESTHESIA ?120  ? SHOULDER SURGERY Left 2011  ? TONSILLECTOMY    ? age 46  ? TOTAL KNEE ARTHROPLASTY Left 02/07/2018  ? Procedure: TOTAL KNEE ARTHROPLASTY;  Surgeon: Gaynelle Arabian, MD;  [facility];  Service: Orthopedics;  Laterality: Left;  25mn  ? TUBAL LIGATION  1970  ? with an appy  ? TYMPANOSTOMY TUBE PLACEMENT  1984  ? ?Patient Active Problem List  ? Diagnosis Date Noted  ? Pain in joint of right shoulder 07/09/2020  ? Body mass index (BMI) 35.0-35.9, adult 06/06/2019  ? Cervical spondylosis with radiculopathy 06/06/2019  ? MDD (major depressive disorder), recurrent, in partial remission ([place] 03/27/2019  ? Depression, major, single episode, mild ([place] 03/07/2018  ? History of total knee replacement, left 02/21/2018  ? OA (osteoarthritis) of knee 02/07/2018  ? Non-allergic rhinitis 12/16/2017  ? Allergy to metal 12/16/2017  ? Pain in right foot 09/21/2017  ? Primary osteoarthritis of both feet 04/29/2017  ? Osteoarthritis of foot joint 04/29/2017  ? Hammer toe 04/23/2017  ? Family history of premature coronary heart disease 04/15/2016  ? Primary osteoarthritis of both knees 12/31/2015  ? Vulvodynia 08/03/2014  ? Anxiety disorder 07/06/2014  ? Fibromyalgia 04/25/2014  ? Prediabetes 03/18/2014  ? Hyperlipidemia 03/18/2014  ? Osteopenia 03/06/2014  ? History of cardiac catheterization 04/28/2013  ? GERD (gastroesophageal reflux disease) 02/02/2012  ? Indigestion 02/02/2012  ? Esophageal dysphagia 02/05/2011  ? CHEST PAIN 02/11/2009  ? IRRITABLE BOWEL SYNDROME 02/15/2007  ? Primary osteoarthritis of both hands 02/15/2007  ? Degenerative joint disease of hand 02/15/2007  ? GASTRITIS 02/14/2007  ? ? ?ONSET DATE: 05/07/21 ? ?REFERRING DIAG: s/p right shoulder arthroscopy with biceps tenotomy ? ?THERAPY DIAG:  ?Acute pain of right  shoulder ? ? ?PERTINENT HISTORY:  Pt is a 81y/o female s/p right shoulder arthroscopy with biceps tenotomy on 05/07/21. Pt reports MD told her she could remove her sling and use her arm for daily tasks as she is able. Pt was referred to occupational therapy for evaluation and treatment by Dr. RSydnee Cabal ? ?PRECAUTIONS: Shoulder, see standard protocol for bicep tenotomy.  ? ?SUBJECTIVE: S: I'm not as sore as last time.  ? ?PAIN:  ?Are you having pain? Yes: NPRS scale: 7/10 ?Pain location: Right shoulder (anterior, posterior, superior) ?Pain description: achy,sore, constant ?Aggravating factors: doing too much.  ?Relieving factors: Pain medication (whole pill taken before session), tried vibration, used ice ? ? ? ? ?OBJECTIVE:  ?UE ROM    ?  ?Assessed seated, er/IR adducted ?Active ROM Right ?05/14/2021  ?Shoulder flexion 90  ?Shoulder abduction 50  ?Shoulder internal rotation 90  ?Shoulder external rotation 16  ?(Blank rows =  not tested) ?  ?Assessed supine, er/IR adducted ?Passive ROM Right ?05/14/2021 Right  ?06/16/21  ?Shoulder flexion 124 135  ?Shoulder abduction 58 135  ?Shoulder internal rotation 90 90  ?Shoulder external rotation 12 30  ?(Blank rows = not tested) ?  ?  ?  ?UE MMT:    ?  ?Unable to test due to pain and precautions ?  ?MMT Right ?05/14/2021  ?Shoulder flexion    ?Shoulder abduction    ?Shoulder internal rotation    ?Shoulder external rotation    ?(Blank rows = not tested) ? ?FOTO: 36/100  ? ?TODAY'S TREATMENT: ?06/16/21 ?- Manual therapy: Completed prior to exercises. Myofascial release completed to right upper arm, upper trapezius, and scapularis region to decrease fascial restrictions.  ?- P/ROM: supine, shoulder, 10X all shoulder ranges. ?- AA/ROM: supine, shoulder, protraction, flexion, IR/er, abduction, 10X ?- AA/ROM, standing, shoulder, all ranges, 5X each ? ?06/10/21 ?- ES and moist heat: right shoulder, 10' 5.8 CV to decrease pain. ?- P/ROM: supine, shoulder, 10X all shoulder  ranges. ? ? ?06/06/2021 ?-Manual therapy: Manual therapy: Completed prior to exercises. Myofascial release completed to right upper arm, upper trapezius, and scapularis region to decrease fascial restrictions.  ?-P/ROM: supine, all sh

## 2021-06-18 ENCOUNTER — Encounter (HOSPITAL_COMMUNITY): Payer: Medicare HMO

## 2021-06-23 ENCOUNTER — Encounter (HOSPITAL_COMMUNITY): Payer: Self-pay | Admitting: Occupational Therapy

## 2021-06-23 ENCOUNTER — Ambulatory Visit (HOSPITAL_COMMUNITY): Payer: Medicare HMO | Admitting: Occupational Therapy

## 2021-06-23 DIAGNOSIS — R29898 Other symptoms and signs involving the musculoskeletal system: Secondary | ICD-10-CM | POA: Diagnosis not present

## 2021-06-23 DIAGNOSIS — M25611 Stiffness of right shoulder, not elsewhere classified: Secondary | ICD-10-CM | POA: Diagnosis not present

## 2021-06-23 DIAGNOSIS — M25511 Pain in right shoulder: Secondary | ICD-10-CM

## 2021-06-23 NOTE — Therapy (Signed)
?OUTPATIENT OCCUPATIONAL THERAPY TREATMENT NOTE ? ? ? ?Patient Name: Ruth [name] ?MRN: [id number] ?DOB:1940-12-01, 81 y.o., female ?Today's Date: 06/23/2021 ? ?PCP: Kathyrn Drown, MD ?REFERRING PROVIDER: Sydnee Cabal, MD ? ?END OF SESSION:  ? OT End of Session - 06/23/21 1201   ? ? Visit Number 8   ? Number of Visits 16   ? Date for OT Re-Evaluation 07/13/21   ? Authorization Type [employer]   ? Authorization Time Period 12 visits approved 05/14/2021-07/09/2021   ? Authorization - Visit Number 7   ? Authorization - Number of Visits 12   ? Progress Note Due on Visit 10   ? OT Start Time 1120   ? OT Stop Time 1200   ? OT Time Calculation (min) 40 min   ? Activity Tolerance Patient tolerated treatment well   ? Behavior During Therapy [facility] for tasks assessed/performed   ? ?  ?  ? ?  ? ? ? ? ? ? ? ?Past Medical History:  ?Diagnosis Date  ? Anemia   ? Anxiety   ? Arthritis   ? Asthma   ? Depression   ? Eczema   ? Fatty liver   ? Fibromyalgia   ? GERD (gastroesophageal reflux disease)   ? EGD/ colon 1/09  ? H pylori ulcer 1980-1990  ? s/p treatment  ? History of kidney stones   ? Hyperlipidemia   ? IBS (irritable bowel syndrome)   ? Kidney cysts   ? Laryngospasm   ? Neck pain   ? OSA (obstructive sleep apnea)   ? Pneumonia 2013  ? PONV (postoperative nausea and vomiting)   ? Pre-diabetes   ? Refusal of blood transfusions as patient is Jehovah's Witness   ? ?Past Surgical History:  ?Procedure Laterality Date  ? ABDOMINAL HYSTERECTOMY  1970  ? BILATERAL SALPINGOOPHORECTOMY  1990s  ? BIOPSY  09/11/2020  ? Procedure: BIOPSY;  Surgeon: Rogene Houston, MD;  [facility];  Service: Endoscopy;;  gastric  ? CARDIAC CATHETERIZATION  2002  ? [facility]) normal coronary arteries   ? COLONOSCOPY  01/2007  ? Dr. Meriel Flavors  ? COLONOSCOPY N/A 04/18/2015  ? Procedure: COLONOSCOPY;  Surgeon: Rogene Houston, MD;  [facility];  Service: Endoscopy;  Laterality: N/A;  830 - moved to 8:55 - Ann to notify pt  ?  COLONOSCOPY WITH ESOPHAGOGASTRODUODENOSCOPY (EGD)  02/16/2012  ? Procedure: COLONOSCOPY WITH ESOPHAGOGASTRODUODENOSCOPY (EGD);  Surgeon: Daneil Dolin, MD;  [facility];  Service: Endoscopy;  Laterality: N/A;  8:45  ? COLONOSCOPY WITH PROPOFOL N/A 09/11/2020  ? Procedure: COLONOSCOPY WITH PROPOFOL;  Surgeon: Rogene Houston, MD;  [facility];  Service: Endoscopy;  Laterality: N/A;  10:55  ? ESOPHAGEAL DILATION N/A 06/07/2014  ? Procedure: ESOPHAGEAL DILATION;  Surgeon: Rogene Houston, MD;  [facility];  Service: Endoscopy;  Laterality: N/A;  ? ESOPHAGEAL DILATION N/A 09/11/2020  ? Procedure: ESOPHAGEAL DILATION;  Surgeon: Rogene Houston, MD;  [facility];  Service: Endoscopy;  Laterality: N/A;  ? ESOPHAGOGASTRODUODENOSCOPY  01/2007  ? Dr Sharlett Iles- 3 cm hiatal hernia, benign esophageal biopsies, erosive esophagitis, gastritis  ? ESOPHAGOGASTRODUODENOSCOPY N/A 06/07/2014  ? Procedure: ESOPHAGOGASTRODUODENOSCOPY (EGD);  Surgeon: Rogene Houston, MD;  [facility];  Service: Endoscopy;  Laterality: N/A;  1200  ? ESOPHAGOGASTRODUODENOSCOPY N/A 08/18/2017  ? Procedure: ESOPHAGOGASTRODUODENOSCOPY (EGD);  Surgeon: Rogene Houston, MD;  [facility];  Service: Endoscopy;  Laterality:  N/A;  2:20  ? ESOPHAGOGASTRODUODENOSCOPY (EGD) WITH PROPOFOL N/A 09/11/2020  ? Procedure: ESOPHAGOGASTRODUODENOSCOPY (EGD) WITH PROPOFOL;  Surgeon: Rogene Houston, MD;  [facility];  Service: Endoscopy;  Laterality: N/A;  ? FOOT SURGERY Left 2005  ? hammer toe  ? INNER EAR SURGERY    ? Left  ? KNEE SURGERY Left   ? x 2  ? POLYPECTOMY  09/11/2020  ? Procedure: POLYPECTOMY;  Surgeon: Rogene Houston, MD;  [facility];  Service: Endoscopy;;  ? SHOULDER ARTHROSCOPY WITH ROTATOR CUFF REPAIR Right 05/07/2021  ? Procedure: SHOULDER ARTHROSCOPY WITH MANIPULATION UNDER ANESTHESIA ,SUBACROMIAL DECOMPRESSION, DISTAL CLAVICLE RESECTION, ROTATOR CUFF REPAIR  , BICEPS TENOTOMY;  Surgeon: Sydnee Cabal, MD;  [facility];  Service: Orthopedics;  Laterality: Right;  WITH INTERSCALENE BLOCK ?NEEDS PRE OP CONSULT  WITH ANESTHESIA ?120  ? SHOULDER SURGERY Left 2011  ? TONSILLECTOMY    ? age 75  ? TOTAL KNEE ARTHROPLASTY Left 02/07/2018  ? Procedure: TOTAL KNEE ARTHROPLASTY;  Surgeon: Gaynelle Arabian, MD;  [facility]: [facility];  Service: Orthopedics;  Laterality: Left;  60mn  ? TUBAL LIGATION  1970  ? with an appy  ? TYMPANOSTOMY TUBE PLACEMENT  1984  ? ?Patient Active Problem List  ? Diagnosis Date Noted  ? Pain in joint of right shoulder 07/09/2020  ? Body mass index (BMI) 35.0-35.9, adult 06/06/2019  ? Cervical spondylosis with radiculopathy 06/06/2019  ? MDD (major depressive disorder), recurrent, in partial remission ([place] 03/27/2019  ? Depression, major, single episode, mild [place] 03/07/2018  ? History of total knee replacement, left 02/21/2018  ? OA (osteoarthritis) of knee 02/07/2018  ? Non-allergic rhinitis 12/16/2017  ? Allergy to metal 12/16/2017  ? Pain in right foot 09/21/2017  ? Primary osteoarthritis of both feet 04/29/2017  ? Osteoarthritis of foot joint 04/29/2017  ? Hammer toe 04/23/2017  ? Family history of premature coronary heart disease 04/15/2016  ? Primary osteoarthritis of both knees 12/31/2015  ? Vulvodynia 08/03/2014  ? Anxiety disorder 07/06/2014  ? Fibromyalgia 04/25/2014  ? Prediabetes 03/18/2014  ? Hyperlipidemia 03/18/2014  ? Osteopenia 03/06/2014  ? History of cardiac catheterization 04/28/2013  ? GERD (gastroesophageal reflux disease) 02/02/2012  ? Indigestion 02/02/2012  ? Esophageal dysphagia 02/05/2011  ? CHEST PAIN 02/11/2009  ? IRRITABLE BOWEL SYNDROME 02/15/2007  ? Primary osteoarthritis of both hands 02/15/2007  ? Degenerative joint disease of hand 02/15/2007  ? GASTRITIS 02/14/2007  ? ? ?ONSET DATE: 05/07/21 ? ?REFERRING DIAG: s/p right shoulder arthroscopy with biceps tenotomy ? ?THERAPY DIAG:  ?Acute pain of right  shoulder ? ? ?PERTINENT HISTORY:  Pt is a 81y/o female s/p right shoulder arthroscopy with biceps tenotomy on 05/07/21. Pt reports MD told her she could remove her sling and use her arm for daily tasks as she is able. Pt was referred to occupational therapy for evaluation and treatment by Dr. RSydnee Cabal ? ?PRECAUTIONS: Shoulder, see standard protocol for bicep tenotomy.  ? ?SUBJECTIVE: S: It's feeling pretty good really.  ? ?PAIN:  ?Are you having pain? Yes: NPRS scale: 6/10 ?Pain location: Right shoulder (anterior, posterior, superior) ?Pain description: achy,sore, constant ?Aggravating factors: doing too much.  ?Relieving factors: Pain medication (whole pill taken before session), tried vibration, used ice ? ? ? ? ?OBJECTIVE:  ?UE ROM    ?  ?Assessed seated, er/IR adducted ?Active ROM Right ?05/14/2021  ?Shoulder flexion 90  ?Shoulder abduction 50  ?Shoulder internal rotation 90  ?Shoulder external rotation 16  ?(Blank rows =  not tested) ?  ?Assessed supine, er/IR adducted ?Passive ROM Right ?05/14/2021 Right  ?06/16/21  ?Shoulder flexion 124 135  ?Shoulder abduction 58 135  ?Shoulder internal rotation 90 90  ?Shoulder external rotation 12 30  ?(Blank rows = not tested) ?  ?  ?  ?UE MMT:    ?  ?Unable to test due to pain and precautions ?  ?MMT Right ?05/14/2021  ?Shoulder flexion    ?Shoulder abduction    ?Shoulder internal rotation    ?Shoulder external rotation    ?(Blank rows = not tested) ? ?FOTO: 47/100 (36/100 previous) ? ?TODAY'S TREATMENT: ?06/23/21 ?- Manual therapy: Completed prior to exercises. Myofascial release completed to right upper arm, upper trapezius, and scapularis region to decrease fascial restrictions.  ?- P/ROM: supine, shoulder, 10X all shoulder ranges. ?- AA/ROM: seated, shoulder, protraction, flexion, IR/er, abduction, 15X ?-wall wash: 1' flexion ?-Pulleys, 1' flexion 1' abduction ? ? ?06/16/21 ?- Manual therapy: Completed prior to exercises. Myofascial release completed to right upper arm,  upper trapezius, and scapularis region to decrease fascial restrictions.  ?- P/ROM: supine, shoulder, 10X all shoulder ranges. ?- AA/ROM: supine, shoulder, protraction, flexion, IR/er, abduction, 10X ?- AA/ROM, stand

## 2021-06-24 NOTE — Progress Notes (Deleted)
Office Visit Note  Patient: Ruth [name]             Date of Birth: [birth year]           MRN: [id number]             PCP: Babs Sciara, MD Referring: Babs Sciara, MD Visit Date: 07/08/2021 Occupation: @GUAROCC @  Subjective:  No chief complaint on file.   History of Present Illness: Ruth [name] is a 81 y.o. female ***   Activities of Daily Living:  Patient reports morning stiffness for *** {minute/hour:19697}.   Patient {ACTIONS;DENIES/REPORTS:21021675::"Denies"} nocturnal pain.  Difficulty dressing/grooming: {ACTIONS;DENIES/REPORTS:21021675::"Denies"} Difficulty climbing stairs: {ACTIONS;DENIES/REPORTS:21021675::"Denies"} Difficulty getting out of chair: {ACTIONS;DENIES/REPORTS:21021675::"Denies"} Difficulty using hands for taps, buttons, cutlery, and/or writing: {ACTIONS;DENIES/REPORTS:21021675::"Denies"}  No Rheumatology ROS completed.   PMFS History:  Patient Active Problem List   Diagnosis Date Noted   Pain in joint of right shoulder 07/09/2020   Body mass index (BMI) 35.0-35.9, adult 06/06/2019   Cervical spondylosis with radiculopathy 06/06/2019   MDD (major depressive disorder), recurrent, in partial remission (HCC) 03/27/2019   Depression, major, single episode, mild (HCC) 03/07/2018   History of total knee replacement, left 02/21/2018   OA (osteoarthritis) of knee 02/07/2018   Non-allergic rhinitis 12/16/2017   Allergy to metal 12/16/2017   Pain in right foot 09/21/2017   Primary osteoarthritis of both feet 04/29/2017   Osteoarthritis of foot joint 04/29/2017   Hammer toe 04/23/2017   Family history of premature coronary heart disease 04/15/2016   Primary osteoarthritis of both knees 12/31/2015   Vulvodynia 08/03/2014   Anxiety disorder 07/06/2014   Fibromyalgia 04/25/2014   Prediabetes 03/18/2014   Hyperlipidemia 03/18/2014   Osteopenia 03/06/2014   History of cardiac catheterization 04/28/2013   GERD (gastroesophageal reflux disease)  02/02/2012   Indigestion 02/02/2012   Esophageal dysphagia 02/05/2011   CHEST PAIN 02/11/2009   IRRITABLE BOWEL SYNDROME 02/15/2007   Primary osteoarthritis of both hands 02/15/2007   Degenerative joint disease of hand 02/15/2007   GASTRITIS 02/14/2007    Past Medical History:  Diagnosis Date   Anemia    Anxiety    Arthritis    Asthma    Depression    Eczema    Fatty liver    Fibromyalgia    GERD (gastroesophageal reflux disease)    EGD/ colon 1/09   H pylori ulcer 1980-1990   s/p treatment   History of kidney stones    Hyperlipidemia    IBS (irritable bowel syndrome)    Kidney cysts    Laryngospasm    Neck pain    OSA (obstructive sleep apnea)    Pneumonia 2013   PONV (postoperative nausea and vomiting)    Pre-diabetes    Refusal of blood transfusions as patient is Jehovah's Witness     Family History  Problem Relation Age of Onset   Diabetes Father    Heart attack Father        38's   Lung cancer Father    Cirrhosis Father 6       etoh cirrhosis   Eczema Father    Lung cancer Sister    Depression Sister    Alzheimer's disease Sister    Hypertension Sister    Diabetes Sister    Lung cancer Sister    Paranoid behavior Mother        depression   Colon polyps Mother    Eczema Mother    Depression Mother    COPD Daughter  Depression Maternal Aunt    Drug abuse Niece    Suicidality Niece    Allergic rhinitis Neg Hx    Asthma Neg Hx    Urticaria Neg Hx    Past Surgical History:  Procedure Laterality Date   ABDOMINAL HYSTERECTOMY  1970   BILATERAL SALPINGOOPHORECTOMY  1990s   BIOPSY  09/11/2020   Procedure: BIOPSY;  Surgeon: Malissa Hippo, MD;  [facility];  Service: Endoscopy;;  gastric   CARDIAC CATHETERIZATION  2002   [facility]) normal coronary arteries    COLONOSCOPY  01/2007   Dr. Laurie Panda   COLONOSCOPY N/A 04/18/2015   Procedure: COLONOSCOPY;  Surgeon: Malissa Hippo, MD;  [facility];  Service: Endoscopy;   Laterality: N/A;  830 - moved to 8:55 - Ann to notify pt   COLONOSCOPY WITH ESOPHAGOGASTRODUODENOSCOPY (EGD)  02/16/2012   Procedure: COLONOSCOPY WITH ESOPHAGOGASTRODUODENOSCOPY (EGD);  Surgeon: Corbin Ade, MD;  [facility];  Service: Endoscopy;  Laterality: N/A;  8:45   COLONOSCOPY WITH PROPOFOL N/A 09/11/2020   Procedure: COLONOSCOPY WITH PROPOFOL;  Surgeon: Malissa Hippo, MD;  [facility];  Service: Endoscopy;  Laterality: N/A;  10:55   ESOPHAGEAL DILATION N/A 06/07/2014   Procedure: ESOPHAGEAL DILATION;  Surgeon: Malissa Hippo, MD;  [facility];  Service: Endoscopy;  Laterality: N/A;   ESOPHAGEAL DILATION N/A 09/11/2020   Procedure: ESOPHAGEAL DILATION;  Surgeon: Malissa Hippo, MD;  [facility];  Service: Endoscopy;  Laterality: N/A;   ESOPHAGOGASTRODUODENOSCOPY  01/2007   Dr Jarold Motto- 3 cm hiatal hernia, benign esophageal biopsies, erosive esophagitis, gastritis   ESOPHAGOGASTRODUODENOSCOPY N/A 06/07/2014   Procedure: ESOPHAGOGASTRODUODENOSCOPY (EGD);  Surgeon: Malissa Hippo, MD;  [facility];  Service: Endoscopy;  Laterality: N/A;  1200   ESOPHAGOGASTRODUODENOSCOPY N/A 08/18/2017   Procedure: ESOPHAGOGASTRODUODENOSCOPY (EGD);  Surgeon: Malissa Hippo, MD;  [facility];  Service: Endoscopy;  Laterality: N/A;  2:20   ESOPHAGOGASTRODUODENOSCOPY (EGD) WITH PROPOFOL N/A 09/11/2020   Procedure: ESOPHAGOGASTRODUODENOSCOPY (EGD) WITH PROPOFOL;  Surgeon: Malissa Hippo, MD;  [facility];  Service: Endoscopy;  Laterality: N/A;   FOOT SURGERY Left 2005   hammer toe   INNER EAR SURGERY     Left   KNEE SURGERY Left    x 2   POLYPECTOMY  09/11/2020   Procedure: POLYPECTOMY;  Surgeon: Malissa Hippo, MD;  [facility];  Service: Endoscopy;;   SHOULDER ARTHROSCOPY WITH ROTATOR CUFF REPAIR Right 05/07/2021   Procedure: SHOULDER ARTHROSCOPY WITH MANIPULATION UNDER ANESTHESIA ,SUBACROMIAL  DECOMPRESSION, DISTAL CLAVICLE RESECTION, ROTATOR CUFF REPAIR , BICEPS TENOTOMY;  Surgeon: Eugenia Mcalpine, MD;  [facility];  Service: Orthopedics;  Laterality: Right;  WITH INTERSCALENE BLOCK NEEDS PRE OP CONSULT  WITH ANESTHESIA 120   SHOULDER SURGERY Left 2011   TONSILLECTOMY     age 32   TOTAL KNEE ARTHROPLASTY Left 02/07/2018   Procedure: TOTAL KNEE ARTHROPLASTY;  Surgeon: Ollen Gross, MD;  [facility];  Service: Orthopedics;  Laterality: Left;    TUBAL LIGATION  1970   with an appy   TYMPANOSTOMY TUBE PLACEMENT  1984   Social History   Social History Narrative   Married 2 grown children, 1 adopted son-autistic-lives next door.   1 granddaughter   2 step grandsons   Immunization History  Administered Date(s) Administered   Influenza,inj,Quad PF,6+ Mos 12/15/2017, 11/15/2018   Influenza,inj,quad, With Preservative 11/16/2016   Influenza-Unspecified 11/22/2015, 11/01/2019,  11/30/2020   Moderna Sars-Covid-2 Vaccination 03/24/2019, 04/22/2019, 11/18/2019, 06/02/2020   Pneumococcal Conjugate-13 02/15/2014   Pneumococcal Polysaccharide-23 01/25/2020   Zoster Recombinat (Shingrix) 07/10/2016     Objective: Vital Signs: There were no vitals taken for this visit.   Physical Exam   Musculoskeletal Exam: ***  CDAI Exam: CDAI Score: -- Patient Global: --; Provider Global: -- Swollen: --; Tender: -- Joint Exam 07/08/2021   No joint exam has been documented for this visit   There is currently no information documented on the homunculus. Go to the Rheumatology activity and complete the homunculus joint exam.  Investigation: No additional findings.  Imaging: No results found.  Recent Labs: Lab Results  Component Value Date   WBC 5.1 04/25/2021   HGB 13.0 04/25/2021   PLT 232 04/25/2021   NA 136 04/25/2021   K 4.5 04/25/2021   CL 102 04/25/2021   CO2 25 04/25/2021   GLUCOSE 102 (H) 04/25/2021   BUN 19 04/25/2021   CREATININE 0.74 04/25/2021    BILITOT 0.3 04/25/2021   ALKPHOS 66 04/25/2021   AST 15 04/25/2021   ALT 16 04/25/2021   PROT 7.0 04/25/2021   ALBUMIN 4.2 04/25/2021   CALCIUM 9.3 04/25/2021   GFRAA 84 07/07/2018    Speciality Comments: No specialty comments available.  Procedures:  No procedures performed Allergies: Blood-group specific substance, Ciprofloxacin, Codeine, Latex, Prozac [fluoxetine], Sertraline hcl, Vicodin [hydrocodone-acetaminophen], and Tramadol   Assessment / Plan:     Visit Diagnoses: No diagnosis found.  Orders: No orders of the defined types were placed in this encounter.  No orders of the defined types were placed in this encounter.   Face-to-face time spent with patient was *** minutes. Greater than 50% of time was spent in counseling and coordination of care.  Follow-Up Instructions: No follow-ups on file.   Ellen Henri, CMA  Note - This record has been created using Animal nutritionist.  Chart creation errors have been sought, but may not always  have been located. Such creation errors do not reflect on  the standard of medical care.

## 2021-06-26 ENCOUNTER — Encounter (HOSPITAL_COMMUNITY): Payer: Medicare HMO

## 2021-06-30 ENCOUNTER — Ambulatory Visit (HOSPITAL_COMMUNITY): Payer: Medicare HMO | Admitting: Occupational Therapy

## 2021-06-30 ENCOUNTER — Encounter (HOSPITAL_COMMUNITY): Payer: Self-pay | Admitting: Occupational Therapy

## 2021-06-30 DIAGNOSIS — M25611 Stiffness of right shoulder, not elsewhere classified: Secondary | ICD-10-CM | POA: Diagnosis not present

## 2021-06-30 DIAGNOSIS — M25511 Pain in right shoulder: Secondary | ICD-10-CM

## 2021-06-30 DIAGNOSIS — R29898 Other symptoms and signs involving the musculoskeletal system: Secondary | ICD-10-CM | POA: Diagnosis not present

## 2021-06-30 NOTE — Therapy (Signed)
?OUTPATIENT OCCUPATIONAL THERAPY TREATMENT NOTE ? ? ? ?Patient Name: Ruth [name] ?MRN: [id number] ?DOB:09-Feb-1941, 81 y.o., female ?Today's Date: 06/30/2021 ? ?PCP: Kathyrn Drown, MD ?REFERRING PROVIDER: Sydnee Cabal, MD ? ?END OF SESSION:  ? OT End of Session - 06/30/21 1343   ? ? Visit Number 9   ? Number of Visits 16   ? Date for OT Re-Evaluation 07/13/21   ? Authorization Type [employer]   ? Authorization Time Period 12 visits approved 05/14/2021-07/09/2021   ? Authorization - Visit Number 8   ? Authorization - Number of Visits 12   ? Progress Note Due on Visit 10   ? OT Start Time 1304   ? OT Stop Time 1342   ? OT Time Calculation (min) 38 min   ? Activity Tolerance Patient tolerated treatment well   ? Behavior During Therapy [facility] for tasks assessed/performed   ? ?  ?  ? ?  ? ? ? ? ? ? ? ? ?Past Medical History:  ?Diagnosis Date  ? Anemia   ? Anxiety   ? Arthritis   ? Asthma   ? Depression   ? Eczema   ? Fatty liver   ? Fibromyalgia   ? GERD (gastroesophageal reflux disease)   ? EGD/ colon 1/09  ? H pylori ulcer 1980-1990  ? s/p treatment  ? History of kidney stones   ? Hyperlipidemia   ? IBS (irritable bowel syndrome)   ? Kidney cysts   ? Laryngospasm   ? Neck pain   ? OSA (obstructive sleep apnea)   ? Pneumonia 2013  ? PONV (postoperative nausea and vomiting)   ? Pre-diabetes   ? Refusal of blood transfusions as patient is Jehovah's Witness   ? ?Past Surgical History:  ?Procedure Laterality Date  ? ABDOMINAL HYSTERECTOMY  1970  ? BILATERAL SALPINGOOPHORECTOMY  1990s  ? BIOPSY  09/11/2020  ? Procedure: BIOPSY;  Surgeon: Rogene Houston, MD;  [facility];  Service: Endoscopy;;  gastric  ? CARDIAC CATHETERIZATION  2002  ? [facility]) normal coronary arteries   ? COLONOSCOPY  01/2007  ? Dr. Meriel Flavors  ? COLONOSCOPY N/A 04/18/2015  ? Procedure: COLONOSCOPY;  Surgeon: Rogene Houston, MD;  [facility];  Service: Endoscopy;  Laterality: N/A;  830 - moved to 8:55 - Ann to notify pt   ? COLONOSCOPY WITH ESOPHAGOGASTRODUODENOSCOPY (EGD)  02/16/2012  ? Procedure: COLONOSCOPY WITH ESOPHAGOGASTRODUODENOSCOPY (EGD);  Surgeon: Daneil Dolin, MD;  [facility];  Service: Endoscopy;  Laterality: N/A;  8:45  ? COLONOSCOPY WITH PROPOFOL N/A 09/11/2020  ? Procedure: COLONOSCOPY WITH PROPOFOL;  Surgeon: Rogene Houston, MD;  [facility];  Service: Endoscopy;  Laterality: N/A;  10:55  ? ESOPHAGEAL DILATION N/A 06/07/2014  ? Procedure: ESOPHAGEAL DILATION;  Surgeon: Rogene Houston, MD;  [facility];  Service: Endoscopy;  Laterality: N/A;  ? ESOPHAGEAL DILATION N/A 09/11/2020  ? Procedure: ESOPHAGEAL DILATION;  Surgeon: Rogene Houston, MD;  [facility];  Service: Endoscopy;  Laterality: N/A;  ? ESOPHAGOGASTRODUODENOSCOPY  01/2007  ? Dr Sharlett Iles- 3 cm hiatal hernia, benign esophageal biopsies, erosive esophagitis, gastritis  ? ESOPHAGOGASTRODUODENOSCOPY N/A 06/07/2014  ? Procedure: ESOPHAGOGASTRODUODENOSCOPY (EGD);  Surgeon: Rogene Houston, MD;  [facility];  Service: Endoscopy;  Laterality: N/A;  1200  ? ESOPHAGOGASTRODUODENOSCOPY N/A 08/18/2017  ? Procedure: ESOPHAGOGASTRODUODENOSCOPY (EGD);  Surgeon: Rogene Houston, MD;  [facility];  Service: Endoscopy;  Laterality: N/A;  2:20  ? ESOPHAGOGASTRODUODENOSCOPY (EGD) WITH PROPOFOL N/A 09/11/2020  ? Procedure: ESOPHAGOGASTRODUODENOSCOPY (EGD) WITH PROPOFOL;  Surgeon: Rogene Houston, MD;  [facility];  Service: Endoscopy;  Laterality: N/A;  ? FOOT SURGERY Left 2005  ? hammer toe  ? INNER EAR SURGERY    ? Left  ? KNEE SURGERY Left   ? x 2  ? POLYPECTOMY  09/11/2020  ? Procedure: POLYPECTOMY;  Surgeon: Rogene Houston, MD;  [facility];  Service: Endoscopy;;  ? SHOULDER ARTHROSCOPY WITH ROTATOR CUFF REPAIR Right 05/07/2021  ? Procedure: SHOULDER ARTHROSCOPY WITH MANIPULATION UNDER ANESTHESIA ,SUBACROMIAL DECOMPRESSION, DISTAL CLAVICLE RESECTION, ROTATOR CUFF  REPAIR , BICEPS TENOTOMY;  Surgeon: Sydnee Cabal, MD;  [facility];  Service: Orthopedics;  Laterality: Right;  WITH INTERSCALENE BLOCK ?NEEDS PRE OP CONSULT  WITH ANESTHESIA ?120  ? SHOULDER SURGERY Left 2011  ? TONSILLECTOMY    ? age 10  ? TOTAL KNEE ARTHROPLASTY Left 02/07/2018  ? Procedure: TOTAL KNEE ARTHROPLASTY;  Surgeon: Gaynelle Arabian, MD;  [facility];  Service: Orthopedics;  Laterality: Left;  25mn  ? TUBAL LIGATION  1970  ? with an appy  ? TYMPANOSTOMY TUBE PLACEMENT  1984  ? ?Patient Active Problem List  ? Diagnosis Date Noted  ? Pain in joint of right shoulder 07/09/2020  ? Body mass index (BMI) 35.0-35.9, adult 06/06/2019  ? Cervical spondylosis with radiculopathy 06/06/2019  ? MDD (major depressive disorder), recurrent, in partial remission ([facility] 03/27/2019  ? Depression, major, single episode, mild ([facility] 03/07/2018  ? History of total knee replacement, left 02/21/2018  ? OA (osteoarthritis) of knee 02/07/2018  ? Non-allergic rhinitis 12/16/2017  ? Allergy to metal 12/16/2017  ? Pain in right foot 09/21/2017  ? Primary osteoarthritis of both feet 04/29/2017  ? Osteoarthritis of foot joint 04/29/2017  ? Hammer toe 04/23/2017  ? Family history of premature coronary heart disease 04/15/2016  ? Primary osteoarthritis of both knees 12/31/2015  ? Vulvodynia 08/03/2014  ? Anxiety disorder 07/06/2014  ? Fibromyalgia 04/25/2014  ? Prediabetes 03/18/2014  ? Hyperlipidemia 03/18/2014  ? Osteopenia 03/06/2014  ? History of cardiac catheterization 04/28/2013  ? GERD (gastroesophageal reflux disease) 02/02/2012  ? Indigestion 02/02/2012  ? Esophageal dysphagia 02/05/2011  ? CHEST PAIN 02/11/2009  ? IRRITABLE BOWEL SYNDROME 02/15/2007  ? Primary osteoarthritis of both hands 02/15/2007  ? Degenerative joint disease of hand 02/15/2007  ? GASTRITIS 02/14/2007  ? ? ?ONSET DATE: 05/07/21 ? ?REFERRING DIAG: s/p right shoulder arthroscopy with biceps tenotomy ? ?THERAPY DIAG:  ?Acute pain of right  shoulder ? ? ?PERTINENT HISTORY:  Pt is a 81y/o female s/p right shoulder arthroscopy with biceps tenotomy on 05/07/21. Pt reports MD told her she could remove her sling and use her arm for daily tasks as she is able. Pt was referred to occupational therapy for evaluation and treatment by Dr. RSydnee Cabal ? ?PRECAUTIONS: Shoulder, see standard protocol for bicep tenotomy.  ? ?SUBJECTIVE: S: It's feeling pretty good really.  ? ?PAIN:  ?Are you having pain? Yes: NPRS scale: 6/10 ?Pain location: Right shoulder (anterior, posterior, superior) ?Pain description: achy,sore, constant ?Aggravating factors: doing too much.  ?Relieving factors: Pain medication (whole pill taken before session), tried vibration, used ice ? ? ? ? ?OBJECTIVE:  ?UE ROM    ?  ?Assessed seated, er/IR adducted ?Active ROM Right ?05/14/2021  ?Shoulder flexion 90  ?Shoulder abduction 50  ?Shoulder internal rotation 90  ?Shoulder external rotation 16  ?(Blank  rows = not tested) ?  ?Assessed supine, er/IR adducted ?Passive ROM Right ?05/14/2021 Right  ?06/16/21  ?Shoulder flexion 124 135  ?Shoulder abduction 58 135  ?Shoulder internal rotation 90 90  ?Shoulder external rotation 12 30  ?(Blank rows = not tested) ?  ?  ?  ?UE MMT:    ?  ?Unable to test due to pain and precautions ?  ?MMT Right ?05/14/2021  ?Shoulder flexion    ?Shoulder abduction    ?Shoulder internal rotation    ?Shoulder external rotation    ?(Blank rows = not tested) ? ?FOTO: 47/100 (36/100 previous) ? ?TODAY'S TREATMENT: ?06/30/21 ?- P/ROM: supine, shoulder, 5X all shoulder ranges. ?-AA/ROM: supine, shoulder, protraction, flexion, IR/er, horizontal abduction, abduction, 15X ?-proximal shoulder strengthening in supine: 4 positions, 10X each, no rest breaks ?-AA/ROM: seated, shoulder, protraction, flexion, IR/er, abduction, 10X ?-Wall wash, 1' flexion ?-UBE: Level 1, 2' forward 2' reverse, pace: 4.5 ? ? ?06/23/21 ?- Manual therapy: Completed prior to exercises. Myofascial release completed  to right upper arm, upper trapezius, and scapularis region to decrease fascial restrictions.  ?- P/ROM: supine, shoulder, 10X all shoulder ranges. ?- AA/ROM: seated, shoulder, protraction, flexion, IR/er, abduction,

## 2021-07-02 ENCOUNTER — Encounter (HOSPITAL_COMMUNITY): Payer: Medicare HMO

## 2021-07-07 ENCOUNTER — Ambulatory Visit (HOSPITAL_COMMUNITY): Payer: Medicare HMO | Admitting: Occupational Therapy

## 2021-07-08 ENCOUNTER — Encounter (HOSPITAL_COMMUNITY): Payer: Self-pay | Admitting: Occupational Therapy

## 2021-07-08 ENCOUNTER — Ambulatory Visit (HOSPITAL_COMMUNITY): Payer: Medicare HMO | Admitting: Occupational Therapy

## 2021-07-08 ENCOUNTER — Ambulatory Visit: Payer: Medicare HMO | Admitting: Rheumatology

## 2021-07-08 DIAGNOSIS — M19072 Primary osteoarthritis, left ankle and foot: Secondary | ICD-10-CM

## 2021-07-08 DIAGNOSIS — R29898 Other symptoms and signs involving the musculoskeletal system: Secondary | ICD-10-CM | POA: Diagnosis not present

## 2021-07-08 DIAGNOSIS — F5101 Primary insomnia: Secondary | ICD-10-CM

## 2021-07-08 DIAGNOSIS — M8589 Other specified disorders of bone density and structure, multiple sites: Secondary | ICD-10-CM

## 2021-07-08 DIAGNOSIS — M19041 Primary osteoarthritis, right hand: Secondary | ICD-10-CM

## 2021-07-08 DIAGNOSIS — G252 Other specified forms of tremor: Secondary | ICD-10-CM

## 2021-07-08 DIAGNOSIS — R5383 Other fatigue: Secondary | ICD-10-CM

## 2021-07-08 DIAGNOSIS — M797 Fibromyalgia: Secondary | ICD-10-CM

## 2021-07-08 DIAGNOSIS — Z96652 Presence of left artificial knee joint: Secondary | ICD-10-CM

## 2021-07-08 DIAGNOSIS — M25611 Stiffness of right shoulder, not elsewhere classified: Secondary | ICD-10-CM

## 2021-07-08 DIAGNOSIS — M25511 Pain in right shoulder: Secondary | ICD-10-CM

## 2021-07-08 DIAGNOSIS — M1711 Unilateral primary osteoarthritis, right knee: Secondary | ICD-10-CM

## 2021-07-08 NOTE — Therapy (Addendum)
OUTPATIENT OCCUPATIONAL THERAPY REASSESSMENT AND TREATMENT    Patient Name: Ruth [name] MRN: [id number] DOB:[birth year], 81 y.o., female Today's Date: 07/08/2021  PCP: Kathyrn Drown, MD REFERRING PROVIDER: Sydnee Cabal, MD  END OF SESSION:   OT End of Session - 07/08/21 1610     Visit Number 10    Number of Visits 16    Date for OT Re-Evaluation 07/13/21    Authorization Type [employer]    Authorization Time Period 12 visits approved 05/14/2021-07/09/2021    Authorization - Visit Number 9    Authorization - Number of Visits 12    Progress Note Due on Visit 10    OT Start Time 1347    OT Stop Time 1430    OT Time Calculation (min) 43 min    Activity Tolerance Patient tolerated treatment well    Behavior During Therapy WFL for tasks assessed/performed                    Past Medical History:  Diagnosis Date   Anemia    Anxiety    Arthritis    Asthma    Depression    Eczema    Fatty liver    Fibromyalgia    GERD (gastroesophageal reflux disease)    EGD/ colon 1/09   H pylori ulcer 1980-1990   s/p treatment   History of kidney stones    Hyperlipidemia    IBS (irritable bowel syndrome)    Kidney cysts    Laryngospasm    Neck pain    OSA (obstructive sleep apnea)    Pneumonia 2013   PONV (postoperative nausea and vomiting)    Pre-diabetes    Refusal of blood transfusions as patient is Jehovah's Witness    Past Surgical History:  Procedure Laterality Date   [place]   BIOPSY  09/11/2020   Procedure: BIOPSY;  Surgeon: Rogene Houston, MD;  [facility];  Service: Endoscopy;;  gastric   CARDIAC CATHETERIZATION  2002   [facility]) normal coronary arteries    COLONOSCOPY  01/2007   Dr. Meriel Flavors   COLONOSCOPY N/A 04/18/2015   Procedure: COLONOSCOPY;  Surgeon: Rogene Houston, MD;  [facility];  Service: Endoscopy;  Laterality: N/A;  830 - moved to 8:55 -  Ann to notify pt   COLONOSCOPY WITH ESOPHAGOGASTRODUODENOSCOPY (EGD)  02/16/2012   Procedure: COLONOSCOPY WITH ESOPHAGOGASTRODUODENOSCOPY (EGD);  Surgeon: Daneil Dolin, MD;  [facility];  Service: Endoscopy;  Laterality: N/A;  8:45   COLONOSCOPY WITH PROPOFOL N/A 09/11/2020   Procedure: COLONOSCOPY WITH PROPOFOL;  Surgeon: Rogene Houston, MD;  [facility];  Service: Endoscopy;  Laterality: N/A;  10:55   ESOPHAGEAL DILATION N/A 06/07/2014   Procedure: ESOPHAGEAL DILATION;  Surgeon: Rogene Houston, MD;  [facility];  Service: Endoscopy;  Laterality: N/A;   ESOPHAGEAL DILATION N/A 09/11/2020   Procedure: ESOPHAGEAL DILATION;  Surgeon: Rogene Houston, MD;  [facility];  Service: Endoscopy;  Laterality: N/A;   ESOPHAGOGASTRODUODENOSCOPY  01/2007   Dr Sharlett Iles- 3 cm hiatal hernia, benign esophageal biopsies, erosive esophagitis, gastritis   ESOPHAGOGASTRODUODENOSCOPY N/A 06/07/2014   Procedure: ESOPHAGOGASTRODUODENOSCOPY (EGD);  Surgeon: Rogene Houston, MD;  [facility];  Service: Endoscopy;  Laterality: N/A;  1200   ESOPHAGOGASTRODUODENOSCOPY N/A 08/18/2017   Procedure: ESOPHAGOGASTRODUODENOSCOPY (EGD);  Surgeon: Rogene Houston, MD;  [facility];  Service:  Endoscopy;  Laterality: N/A;  2:20   ESOPHAGOGASTRODUODENOSCOPY (EGD) WITH PROPOFOL N/A 09/11/2020   Procedure: ESOPHAGOGASTRODUODENOSCOPY (EGD) WITH PROPOFOL;  Surgeon: Rogene Houston, MD;  [facility];  Service: Endoscopy;  Laterality: N/A;   FOOT SURGERY Left 2005   hammer toe   INNER EAR SURGERY     Left   KNEE SURGERY Left    x 2   POLYPECTOMY  09/11/2020   Procedure: POLYPECTOMY;  Surgeon: Rogene Houston, MD;  [facility];  Service: Endoscopy;;   SHOULDER ARTHROSCOPY WITH ROTATOR CUFF REPAIR Right 05/07/2021   Procedure: SHOULDER ARTHROSCOPY WITH MANIPULATION UNDER ANESTHESIA ,SUBACROMIAL DECOMPRESSION, DISTAL CLAVICLE RESECTION,  ROTATOR CUFF REPAIR , BICEPS TENOTOMY;  Surgeon: Sydnee Cabal, MD;  [facility];  Service: Orthopedics;  Laterality: Right;  WITH INTERSCALENE BLOCK NEEDS PRE OP CONSULT  WITH ANESTHESIA 120   SHOULDER SURGERY Left 2011   TONSILLECTOMY     age 89   TOTAL KNEE ARTHROPLASTY Left 02/07/2018   Procedure: TOTAL KNEE ARTHROPLASTY;  Surgeon: Gaynelle Arabian, MD;  [facility];  Service: Orthopedics;  Laterality: Left;  18mn   TUBAL LIGATION  1970   with an appy   [place]Y [place]  Patient Active Problem List   Diagnosis Date Noted   Pain in joint of right shoulder 07/09/2020   Body mass index (BMI) 35.0-35.9, adult 06/06/2019   Cervical spondylosis with radiculopathy 06/06/2019   MDD (major depressive disorder), recurrent, in partial remission ([facility] 03/27/2019   Depression, major, single episode, mild ([facility] 03/07/2018   History of total knee replacement, left 02/21/2018   OA (osteoarthritis) of knee 02/07/2018   Non-allergic rhinitis 12/16/2017   Allergy to metal 12/16/2017   Pain in right foot 09/21/2017   Primary osteoarthritis of both feet 04/29/2017   Osteoarthritis of foot joint 04/29/2017   Hammer toe 04/23/2017   Family history of premature coronary heart disease 04/15/2016   Primary osteoarthritis of both knees 12/31/2015   Vulvodynia 08/03/2014   Anxiety disorder 07/06/2014   Fibromyalgia 04/25/2014   Prediabetes 03/18/2014   Hyperlipidemia 03/18/2014   Osteopenia 03/06/2014   History of cardiac catheterization 04/28/2013   GERD (gastroesophageal reflux disease) 02/02/2012   Indigestion 02/02/2012   Esophageal dysphagia 02/05/2011   CHEST PAIN 02/11/2009   IRRITABLE BOWEL SYNDROME 02/15/2007   Primary osteoarthritis of both hands 02/15/2007   Degenerative joint disease of hand 02/15/2007   GASTRITIS 02/14/2007    ONSET DATE: 05/07/21  REFERRING DIAG: s/p right shoulder arthroscopy with biceps tenotomy  THERAPY DIAG:  Acute pain of  right shoulder   PERTINENT HISTORY:  Pt is a 81y/o female s/p right shoulder arthroscopy with biceps tenotomy on 05/07/21. Pt reports MD told her she could remove her sling and use her arm for daily tasks as she is able. Pt was referred to occupational therapy for evaluation and treatment by Dr. RSydnee Cabal  PRECAUTIONS: Shoulder, see standard protocol for bicep tenotomy.   SUBJECTIVE: S: Day before yesterday was the last time of doing exercises at home.   PAIN:  Are you having pain? Yes: NPRS scale: 5/10 Pain location: Right shoulder (anterior, posterior, superior) Pain description: achy,sore Aggravating factors: doing too much.  Relieving factors: Pain medication (not any today, PRN), tried vibration, used ice   Progress Note Reporting Period 05/14/21 to 07/08/21  See note below for Objective Data and Assessment of Progress/Goals.    OBJECTIVE:  UE ROM      Assessed seated,  er/IR adducted Active ROM Right 05/14/2021 Right  5/23  Shoulder flexion 90 128  Shoulder abduction 50 135  Shoulder internal rotation 90 90  Shoulder external rotation 16 34  (Blank rows = not tested)   Assessed supine, er/IR adducted Passive ROM Right 05/14/2021 Right  06/16/21 Right  5/23  Shoulder flexion 124 135 138  Shoulder abduction 58 135 180  Shoulder internal rotation 90 90 90  Shoulder external rotation 12 30 50  (Blank rows = not tested)       UE MMT:      Unable to test due to pain and precautions   MMT Right 05/14/2021  Shoulder flexion    Shoulder abduction    Shoulder internal rotation    Shoulder external rotation    (Blank rows = not tested)  FOTO: 59/100 (47/100 ,36/100 previous)  TODAY'S TREATMENT: 07/08/21 - P/ROM: supine, shoulder, 5X all shoulder ranges. - towel stretch reaching behind back x10 with 2 second hold.  -x10 A/ROM reaching behind back with internal rotation.   06/30/21 - P/ROM: supine, shoulder, 5X all shoulder ranges. -AA/ROM: supine, shoulder,  protraction, flexion, IR/er, horizontal abduction, abduction, 15X -proximal shoulder strengthening in supine: 4 positions, 10X each, no rest breaks -AA/ROM: seated, shoulder, protraction, flexion, IR/er, abduction, 10X -Wall wash, 1' flexion -UBE: Level 1, 2' forward 2' reverse, pace: 4.5   06/23/21 - Manual therapy: Completed prior to exercises. Myofascial release completed to right upper arm, upper trapezius, and scapularis region to decrease fascial restrictions.  - P/ROM: supine, shoulder, 10X all shoulder ranges. - AA/ROM: seated, shoulder, protraction, flexion, IR/er, abduction, 15X -wall wash: 1' flexion -Pulleys, 1' flexion 1' abduction    Rationale for Evaluation and Treatment Rehabilitation     GOALS: Goals reviewed with patient? Yes   SHORT TERM GOALS: Target date: 06/11/2021   Pt will be provided with and educated on HEP to improve mobility of RUE required for use as dominant during ADL completion.    Goal status: ONGOING   2.  Pt will increase RUE P/ROM to [facility] to improve ability to complete dressing and bathing tasks with minimal compensatory strategies.    Goal status: ONGOING 5/23: Husband has to help with back but all other is independent. Not fully within P/ROM [facility] yet.    3.  Pt will increase RUE strength to 3/5 or greater to improve ability to reach for items at waist to chest height during ADLs such as meal preparation and eating.     Goal status: ONGOING  5/23: pt showing improvement but not yet at full A/ROM against gravity. Pt can reach overhead but nothing with weight.            LONG TERM GOALS: Target date: 07/09/2021   Pt will return to highest functional level using RUE as dominant during ADL and leisure tasks.    Goal status: ONGOING   2.  Pt will decrease pain in RUE to 3/10 or less to improve ability to sleep for 3+ consecutive hours without waking due to pain.    Goal status: ONGOING 5/23: Pt still not sleeping well. Able to sleep 2 hours  in a row.    3.  Pt will decrease RUE fascial restrictions to trace amounts to improve mobility required for functional reaching tasks.    Goal status: ONGOING 5/23: fascial restriction still min to moderate with pt reporting continued tenderness to touch at times.    4.  Pt will increase RUE A/ROM to [facility] to improve  ability to reach overhead and behind back when bathing and dressing.    Goal status: ONGOING   5.  Pt will increase RUE strength to 4+/5 or greater to improve ability to perform lifting tasks required for meal preparation tasks and housekeeping tasks.    Goal status: ONGOING    HOME EXERCISE PROGRAM: Eval: table slides: 06/06/2021 AA/ROM in supine    PATIENT EDUCATION: Education details: Discussed ice/heat for pain Person educated: patient Education method: verbal explanation  Education comprehension: verbalized understanding    PLAN:  OT FREQUENCY: 1x/week    OT DURATION: 4 weeks      CONSULTED AND AGREED WITH PLAN OF CARE: Patient and family member/caregiver  Clinical Impression Statement: A: Reassessment completed with pt today. Pt demonstrates improved range of motion in all areas tested. Noted to be in normal ranges for shoulder abduction P/ROM at this time. Pt is nearly demonstrating [facility] P/ROM but not fully due to limitations in flexion and external rotation. Pt has progressed in goals but is not fully meeting any goal yet. Pt tolerated myofascial release, P/ROM, and work on reaching behind back today. Pt will benefit from continued skilled OT services to address deficit areas.    PLANNED INTERVENTIONS: self care/ADL training, therapeutic exercise, therapeutic activity, manual therapy, passive range of motion, electrical stimulation, ultrasound, patient/family education, and DME and/or AE instructions      PLAN FOR NEXT SESSION: P: Pt will continue to benefit from skilled OT services to address deficit areas. Attempt scapular theraband if pain allows.  Possibly progress to A/ROM if pt is able.       [address] [place], [place]  [phone] 07/08/2021, 4:32 PM

## 2021-07-09 ENCOUNTER — Encounter (HOSPITAL_COMMUNITY): Payer: Medicare HMO | Admitting: Occupational Therapy

## 2021-07-16 ENCOUNTER — Encounter (HOSPITAL_COMMUNITY): Payer: Medicare HMO

## 2021-07-16 ENCOUNTER — Ambulatory Visit (HOSPITAL_COMMUNITY): Payer: Medicare HMO | Admitting: Occupational Therapy

## 2021-07-16 ENCOUNTER — Encounter (HOSPITAL_COMMUNITY): Payer: Self-pay

## 2021-07-18 ENCOUNTER — Encounter (HOSPITAL_COMMUNITY): Payer: Self-pay | Admitting: Occupational Therapy

## 2021-07-18 ENCOUNTER — Ambulatory Visit (HOSPITAL_COMMUNITY): Payer: Medicare HMO | Attending: Specialist | Admitting: Occupational Therapy

## 2021-07-18 DIAGNOSIS — M25511 Pain in right shoulder: Secondary | ICD-10-CM | POA: Insufficient documentation

## 2021-07-18 DIAGNOSIS — M25611 Stiffness of right shoulder, not elsewhere classified: Secondary | ICD-10-CM | POA: Diagnosis not present

## 2021-07-18 DIAGNOSIS — R29898 Other symptoms and signs involving the musculoskeletal system: Secondary | ICD-10-CM | POA: Insufficient documentation

## 2021-07-18 DIAGNOSIS — Z4789 Encounter for other orthopedic aftercare: Secondary | ICD-10-CM | POA: Diagnosis not present

## 2021-07-18 NOTE — Therapy (Signed)
OUTPATIENT OCCUPATIONAL THERAPY  TREATMENT    Patient Name: Ruth [name] MRN: [id number] DOB:1940/07/03, 81 y.o., female Today's Date: 07/18/2021  PCP: Kathyrn Drown, MD REFERRING PROVIDER: Sydnee Cabal, MD  END OF SESSION:   OT End of Session - 07/18/21 1307     Visit Number 11    Number of Visits 16    Date for OT Re-Evaluation 08/08/21    Authorization Type [employer]    Authorization Time Period 4 visits approved 07/10/2021-08/08/2021    Authorization - Visit Number 1    Authorization - Number of Visits 4    Progress Note Due on Visit 20    OT Start Time 1126    OT Stop Time 1200    OT Time Calculation (min) 34 min    Activity Tolerance Patient tolerated treatment well    Behavior During Therapy WFL for tasks assessed/performed                     Past Medical History:  Diagnosis Date   Anemia    Anxiety    Arthritis    Asthma    Depression    Eczema    Fatty liver    Fibromyalgia    GERD (gastroesophageal reflux disease)    EGD/ colon 1/09   H pylori ulcer 1980-1990   s/p treatment   History of kidney stones    Hyperlipidemia    IBS (irritable bowel syndrome)    Kidney cysts    Laryngospasm    Neck pain    OSA (obstructive sleep apnea)    Pneumonia 2013   PONV (postoperative nausea and vomiting)    Pre-diabetes    Refusal of blood transfusions as patient is Jehovah's Witness    Past Surgical History:  Procedure Laterality Date   [place]   BIOPSY  09/11/2020   Procedure: BIOPSY;  Surgeon: Rogene Houston, MD;  [facility];  Service: Endoscopy;;  gastric   CARDIAC CATHETERIZATION  2002   [facility]) normal coronary arteries    COLONOSCOPY  01/2007   Dr. Meriel Flavors   COLONOSCOPY N/A 04/18/2015   Procedure: COLONOSCOPY;  Surgeon: Rogene Houston, MD;  [facility];  Service: Endoscopy;  Laterality: N/A;  830 - moved to 8:55 - Ann to notify pt    COLONOSCOPY WITH ESOPHAGOGASTRODUODENOSCOPY (EGD)  02/16/2012   Procedure: COLONOSCOPY WITH ESOPHAGOGASTRODUODENOSCOPY (EGD);  Surgeon: Daneil Dolin, MD;  [facility];  Service: Endoscopy;  Laterality: N/A;  8:45   COLONOSCOPY WITH PROPOFOL N/A 09/11/2020   Procedure: COLONOSCOPY WITH PROPOFOL;  Surgeon: Rogene Houston, MD;  [facility];  Service: Endoscopy;  Laterality: N/A;  10:55   ESOPHAGEAL DILATION N/A 06/07/2014   Procedure: ESOPHAGEAL DILATION;  Surgeon: Rogene Houston, MD;  [facility];  Service: Endoscopy;  Laterality: N/A;   ESOPHAGEAL DILATION N/A 09/11/2020   Procedure: ESOPHAGEAL DILATION;  Surgeon: Rogene Houston, MD;  [facility];  Service: Endoscopy;  Laterality: N/A;   ESOPHAGOGASTRODUODENOSCOPY  01/2007   Dr Sharlett Iles- 3 cm hiatal hernia, benign esophageal biopsies, erosive esophagitis, gastritis   ESOPHAGOGASTRODUODENOSCOPY N/A 06/07/2014   Procedure: ESOPHAGOGASTRODUODENOSCOPY (EGD);  Surgeon: Rogene Houston, MD;  [facility];  Service: Endoscopy;  Laterality: N/A;  1200   ESOPHAGOGASTRODUODENOSCOPY N/A 08/18/2017   Procedure: ESOPHAGOGASTRODUODENOSCOPY (EGD);  Surgeon: Rogene Houston, MD;  [facility];  Service:  Endoscopy;  Laterality: N/A;  2:20   ESOPHAGOGASTRODUODENOSCOPY (EGD) WITH PROPOFOL N/A 09/11/2020   Procedure: ESOPHAGOGASTRODUODENOSCOPY (EGD) WITH PROPOFOL;  Surgeon: Rogene Houston, MD;  [facility];  Service: Endoscopy;  Laterality: N/A;   FOOT SURGERY Left 2005   hammer toe   INNER EAR SURGERY     Left   KNEE SURGERY Left    x 2   POLYPECTOMY  09/11/2020   Procedure: POLYPECTOMY;  Surgeon: Rogene Houston, MD;  [facility];  Service: Endoscopy;;   SHOULDER ARTHROSCOPY WITH ROTATOR CUFF REPAIR Right 05/07/2021   Procedure: SHOULDER ARTHROSCOPY WITH MANIPULATION UNDER ANESTHESIA ,SUBACROMIAL DECOMPRESSION, DISTAL CLAVICLE RESECTION, ROTATOR CUFF REPAIR  , BICEPS TENOTOMY;  Surgeon: Sydnee Cabal, MD;  Location: [facility];  Service: Orthopedics;  Laterality: Right;  WITH INTERSCALENE BLOCK NEEDS PRE OP CONSULT  WITH ANESTHESIA 120   SHOULDER SURGERY Left 2011   TONSILLECTOMY     age 69   TOTAL KNEE ARTHROPLASTY Left 02/07/2018   Procedure: TOTAL KNEE ARTHROPLASTY;  Surgeon: Gaynelle Arabian, MD;  Location: [facility];  Service: Orthopedics;  Laterality: Left;  17mn   TUBAL LIGATION  1970   with an appy   [place]MPANOSTOMY [place]  Patient Active Problem List   Diagnosis Date Noted   Pain in joint of right shoulder 07/09/2020   Body mass index (BMI) 35.0-35.9, adult 06/06/2019   Cervical spondylosis with radiculopathy 06/06/2019   MDD (major depressive disorder), recurrent, in partial remission ([place] 03/27/2019   Depression, major, single episode, mild ([place] 03/07/2018   History of total knee replacement, left 02/21/2018   OA (osteoarthritis) of knee 02/07/2018   Non-allergic rhinitis 12/16/2017   Allergy to metal 12/16/2017   Pain in right foot 09/21/2017   Primary osteoarthritis of both feet 04/29/2017   Osteoarthritis of foot joint 04/29/2017   Hammer toe 04/23/2017   Family history of premature coronary heart disease 04/15/2016   Primary osteoarthritis of both knees 12/31/2015   Vulvodynia 08/03/2014   Anxiety disorder 07/06/2014   Fibromyalgia 04/25/2014   Prediabetes 03/18/2014   Hyperlipidemia 03/18/2014   Osteopenia 03/06/2014   History of cardiac catheterization 04/28/2013   GERD (gastroesophageal reflux disease) 02/02/2012   Indigestion 02/02/2012   Esophageal dysphagia 02/05/2011   CHEST PAIN 02/11/2009   IRRITABLE BOWEL SYNDROME 02/15/2007   Primary osteoarthritis of both hands 02/15/2007   Degenerative joint disease of hand 02/15/2007   GASTRITIS 02/14/2007    ONSET DATE: 05/07/21  REFERRING DIAG: s/p right shoulder arthroscopy with biceps tenotomy  THERAPY DIAG:  Acute pain of right  shoulder   PERTINENT HISTORY:  Pt is a 81y/o female s/p right shoulder arthroscopy with biceps tenotomy on 05/07/21. Pt reports MD told her she could remove her sling and use her arm for daily tasks as she is able. Pt was referred to occupational therapy for evaluation and treatment by Dr. RSydnee Cabal  PRECAUTIONS: Shoulder, see standard protocol for bicep tenotomy.   SUBJECTIVE: S: I only did one of my exercises yesterday. "I did not sleep well last night, I dont think sleeping in the recliner is working"   PAIN:  Are you having pain? Yes: NPRS scale: 6-7/10 Pain location: Right shoulder (anterior, posterior, superior) Pain description: achy,sore Aggravating factors: doing too much.  Relieving factors: Pain medication (not any today, PRN), tried vibration, used ice- will take pain meds when she gets home     OBJECTIVE:  UE ROM      Assessed  seated, er/IR adducted Active ROM Right 05/14/2021 Right  5/23  Shoulder flexion 90 128  Shoulder abduction 50 135  Shoulder internal rotation 90 90  Shoulder external rotation 16 34  (Blank rows = not tested)   Assessed supine, er/IR adducted Passive ROM Right 05/14/2021 Right  06/16/21 Right  5/23  Shoulder flexion 124 135 138  Shoulder abduction 58 135 180  Shoulder internal rotation 90 90 90  Shoulder external rotation 12 30 50  (Blank rows = not tested)       UE MMT:      Unable to test due to pain and precautions   MMT Right 05/14/2021  Shoulder flexion    Shoulder abduction    Shoulder internal rotation    Shoulder external rotation    (Blank rows = not tested)  FOTO: 59/100 (47/100 ,36/100 previous)  TODAY'S TREATMENT:  07/18/21 - manual: very tender to touch anterior upper/middle shoulder, not able to tolerate pressure noted facial grimacing, manual therapy    completed for 5 mins   - P/ROM, supine, shoulder 5x all directions  - A/AROM shoulder supine, protraction, flexion, horizontal abduction, abduction 10x  each  - UE stretching: flexion/abduction standing at wall 10 second hold repeated each stretch 3x  07/08/21 - P/ROM: supine, shoulder, 5X all shoulder ranges. - towel stretch reaching behind back x10 with 2 second hold.  -x10 A/ROM reaching behind back with internal rotation.   06/30/21 - P/ROM: supine, shoulder, 5X all shoulder ranges. -AA/ROM: supine, shoulder, protraction, flexion, IR/er, horizontal abduction, abduction, 15X -proximal shoulder strengthening in supine: 4 positions, 10X each, no rest breaks -AA/ROM: seated, shoulder, protraction, flexion, IR/er, abduction, 10X -Wall wash, 1' flexion -UBE: Level 1, 2' forward 2' reverse, pace: 4.5    Rationale for Evaluation and Treatment Rehabilitation     GOALS: Goals reviewed with patient? Yes   SHORT TERM GOALS: Target date: 06/11/2021   Pt will be provided with and educated on HEP to improve mobility of RUE required for use as dominant during ADL completion.    Goal status: ONGOING   2.  Pt will increase RUE P/ROM to [facility] to improve ability to complete dressing and bathing tasks with minimal compensatory strategies.    Goal status: ONGOING 5/23: Husband has to help with back but all other is independent. Not fully within P/ROM [facility] yet.    3.  Pt will increase RUE strength to 3/5 or greater to improve ability to reach for items at waist to chest height during ADLs such as meal preparation and eating.     Goal status: ONGOING  5/23: pt showing improvement but not yet at full A/ROM against gravity. Pt can reach overhead but nothing with weight.            LONG TERM GOALS: Target date: 07/09/2021   Pt will return to highest functional level using RUE as dominant during ADL and leisure tasks.    Goal status: ONGOING   2.  Pt will decrease pain in RUE to 3/10 or less to improve ability to sleep for 3+ consecutive hours without waking due to pain.    Goal status: ONGOING 5/23: Pt still not sleeping well. Able to sleep 2  hours in a row.    3.  Pt will decrease RUE fascial restrictions to trace amounts to improve mobility required for functional reaching tasks.    Goal status: ONGOING 5/23: fascial restriction still min to moderate with pt reporting continued tenderness to touch at times.  4.  Pt will increase RUE A/ROM to [facility] to improve ability to reach overhead and behind back when bathing and dressing.    Goal status: ONGOING   5.  Pt will increase RUE strength to 4+/5 or greater to improve ability to perform lifting tasks required for meal preparation tasks and housekeeping tasks.    Goal status: ONGOING    HOME EXERCISE PROGRAM: Eval: table slides: 06/06/2021 AA/ROM in supine    PATIENT EDUCATION: Education details: Shoulder stretches Person educated: patient Education method: verbal explanation  Education comprehension: verbalized understanding, return demonstrate     PLAN:  OT FREQUENCY: 1x/week    OT DURATION: 4 weeks      CONSULTED AND AGREED WITH PLAN OF CARE: Patient and family member/caregiver  Clinical Impression Statement: A: Pt. Reports that she was in an increased amount of pain this session, progressed through P/ROM and stretching as pt. could tolerate. Began session with manual therapy, pt. Very tender to touch on all areas of upper shoulder- grimacing and unable to tolerate any additional pressure other than light touch. Completed P/ROM in all directions , A/AROM (protraction, flexion, horizontal abduction, abduction) in supine. VC for A/ROM and UE placement when completing stretching. Issued new HEP exercises: shoulder flexion and abduction stretch while standing at wall.    PLANNED INTERVENTIONS: self care/ADL training, therapeutic exercise, therapeutic activity, manual therapy, passive range of motion, electrical stimulation, ultrasound, patient/family education, and DME and/or AE instructions      PLAN FOR NEXT SESSION: P: Pt will continue to benefit from skilled OT  services to address deficit areas. Attempt scapular theraband if pain allows. Possibly progress to A/ROM if pt is able.      Arvil Persons, OTR/L            [phone] 07/18/2021, 1:13 PM

## 2021-07-18 NOTE — Patient Instructions (Signed)
  1) Flexion Wall Stretch    Face wall, place affected handon wall in front of you. Slide hand up the wall  and lean body in towards the wall. Hold for 10 seconds. Repeat 3-5 times. 1-2 times/day.   2.   Stand with right shoulder/arm beside wall, slide hand up the wall and lean body in towards the wall. Hold for 10 seconds. Repeat 3-5 times. 1-2 times/day.

## 2021-07-21 ENCOUNTER — Ambulatory Visit: Payer: Medicare HMO | Admitting: Family Medicine

## 2021-07-21 ENCOUNTER — Encounter: Payer: Self-pay | Admitting: Family Medicine

## 2021-07-22 ENCOUNTER — Encounter (HOSPITAL_COMMUNITY): Payer: Medicare HMO | Admitting: Specialist

## 2021-07-22 DIAGNOSIS — F332 Major depressive disorder, recurrent severe without psychotic features: Secondary | ICD-10-CM | POA: Diagnosis not present

## 2021-07-23 ENCOUNTER — Encounter: Payer: Self-pay | Admitting: Family Medicine

## 2021-07-23 ENCOUNTER — Ambulatory Visit (INDEPENDENT_AMBULATORY_CARE_PROVIDER_SITE_OTHER): Payer: Medicare HMO | Admitting: Family Medicine

## 2021-07-23 ENCOUNTER — Other Ambulatory Visit: Payer: Self-pay | Admitting: Family Medicine

## 2021-07-23 VITALS — BP 133/66 | HR 87 | Temp 97.0°F | Wt 218.6 lb

## 2021-07-23 DIAGNOSIS — R0609 Other forms of dyspnea: Secondary | ICD-10-CM

## 2021-07-23 DIAGNOSIS — R002 Palpitations: Secondary | ICD-10-CM

## 2021-07-23 MED ORDER — FLUOXETINE HCL 10 MG PO CAPS: 10.0000 mg | ORAL_CAPSULE | Freq: Every day | ORAL | 1 refills | Status: DC

## 2021-07-23 MED ORDER — FLUOXETINE HCL 10 MG PO CAPS: 10.0000 mg | ORAL_CAPSULE | Freq: Every day | ORAL | 0 refills | Status: DC

## 2021-07-23 NOTE — Progress Notes (Signed)
   Subjective:    [name], female    DOB: [birth year], 81 y.o.   MRN: [id number]  HPI Pt has been having issues with palpations for a while. Here recently have worsened and lasting longer. Pt also has had feeling of faint. Pt reports shortness of breath and some chest tightness.  Patient relates intermittent palpitations at times feels a little short of breath at times little tight in the chest When she walks she gets out of breath Denies PND orthopnea  Review of Systems     Objective:   Physical Exam General-in no acute distress Eyes-no discharge Lungs-respiratory rate normal, CTA CV-no murmurs,RRR Extremities skin warm dry no edema Neuro grossly normal Behavior normal, alert        Assessment & Plan:  Intermittent palpitations Patient at increased risk of coronary artery disease Needs further evaluation or work-up by cardiology More than likely will also need telemetry monitor to see if she has intermittent atrial fibrillation May need other testing to rule out coronary artery disease Referral cardiology

## 2021-07-24 ENCOUNTER — Encounter (HOSPITAL_COMMUNITY): Payer: Medicare HMO

## 2021-07-24 DIAGNOSIS — Z4789 Encounter for other orthopedic aftercare: Secondary | ICD-10-CM | POA: Diagnosis not present

## 2021-07-25 NOTE — Progress Notes (Signed)
07/25/21- my chart message sent to patient

## 2021-07-28 ENCOUNTER — Ambulatory Visit: Payer: Medicare HMO | Admitting: Nurse Practitioner

## 2021-07-29 ENCOUNTER — Ambulatory Visit (HOSPITAL_COMMUNITY): Payer: Medicare HMO | Attending: Occupational Therapy | Admitting: Occupational Therapy

## 2021-07-31 ENCOUNTER — Encounter (HOSPITAL_COMMUNITY): Payer: Medicare HMO

## 2021-08-01 ENCOUNTER — Encounter (HOSPITAL_COMMUNITY): Payer: Medicare HMO | Admitting: Occupational Therapy

## 2021-08-04 ENCOUNTER — Telehealth (HOSPITAL_COMMUNITY): Payer: Self-pay | Admitting: Occupational Therapy

## 2021-08-04 NOTE — Telephone Encounter (Signed)
She has inflamation in the shoulder and pt's states MD wants her to be on hold until he tells her to r/s . Patient will calll back to rs at a later date.

## 2021-08-05 ENCOUNTER — Encounter (HOSPITAL_COMMUNITY): Payer: Medicare HMO | Admitting: Occupational Therapy

## 2021-08-05 DIAGNOSIS — G4700 Insomnia, unspecified: Secondary | ICD-10-CM | POA: Diagnosis not present

## 2021-08-05 DIAGNOSIS — R5382 Chronic fatigue, unspecified: Secondary | ICD-10-CM | POA: Diagnosis not present

## 2021-08-05 DIAGNOSIS — M797 Fibromyalgia: Secondary | ICD-10-CM | POA: Diagnosis not present

## 2021-08-05 DIAGNOSIS — F332 Major depressive disorder, recurrent severe without psychotic features: Secondary | ICD-10-CM | POA: Diagnosis not present

## 2021-08-06 ENCOUNTER — Encounter (HOSPITAL_COMMUNITY): Payer: Medicare HMO

## 2021-08-08 ENCOUNTER — Encounter (HOSPITAL_COMMUNITY): Payer: Medicare HMO | Admitting: Occupational Therapy

## 2021-08-11 ENCOUNTER — Ambulatory Visit (HOSPITAL_COMMUNITY)
Admission: RE | Admit: 2021-08-11 | Discharge: 2021-08-11 | Disposition: A | Discharge status: Home / Self Care | Payer: Medicare HMO | Source: Ambulatory Visit | Attending: Family Medicine | Admitting: Family Medicine

## 2021-08-11 DIAGNOSIS — R0600 Dyspnea, unspecified: Secondary | ICD-10-CM | POA: Diagnosis present

## 2021-08-11 DIAGNOSIS — R0609 Other forms of dyspnea: Secondary | ICD-10-CM | POA: Insufficient documentation

## 2021-08-11 DIAGNOSIS — R002 Palpitations: Secondary | ICD-10-CM

## 2021-08-11 LAB — ECHOCARDIOGRAM COMPLETE
AR max vel: 2.39 cm2
AV Peak grad: 8 mmHg
Ao pk vel: 1.41 m/s
Area-P 1/2: 4.52 cm2
S' Lateral: 2.5 cm

## 2021-08-12 ENCOUNTER — Encounter (HOSPITAL_COMMUNITY): Payer: Medicare HMO | Admitting: Occupational Therapy

## 2021-08-12 NOTE — Telephone Encounter (Signed)
Nurses Please see echo results and the documentation that I put with it

## 2021-08-15 ENCOUNTER — Encounter (HOSPITAL_COMMUNITY): Payer: Medicare HMO | Admitting: Occupational Therapy

## 2021-08-20 DIAGNOSIS — R0609 Other forms of dyspnea: Secondary | ICD-10-CM | POA: Diagnosis not present

## 2021-08-20 DIAGNOSIS — R002 Palpitations: Secondary | ICD-10-CM | POA: Diagnosis not present

## 2021-08-21 LAB — T4, FREE: Free T4: 1.04 ng/dL (ref 0.82–1.77)

## 2021-08-21 LAB — BRAIN NATRIURETIC PEPTIDE: BNP: 16.4 pg/mL (ref 0.0–100.0)

## 2021-08-21 LAB — TSH: TSH: 1.55 u[IU]/mL (ref 0.450–4.500)

## 2021-08-22 ENCOUNTER — Encounter: Payer: Self-pay | Admitting: Nurse Practitioner

## 2021-08-22 ENCOUNTER — Ambulatory Visit (INDEPENDENT_AMBULATORY_CARE_PROVIDER_SITE_OTHER): Payer: Medicare HMO | Admitting: Nurse Practitioner

## 2021-08-22 VITALS — BP 123/74 | HR 83 | Temp 97.7°F | Ht 65.0 in | Wt 217.0 lb

## 2021-08-22 DIAGNOSIS — J069 Acute upper respiratory infection, unspecified: Secondary | ICD-10-CM

## 2021-08-22 MED ORDER — AZITHROMYCIN 250 MG PO TABS: ORAL_TABLET | ORAL | 0 refills | Status: DC

## 2021-08-22 NOTE — Progress Notes (Signed)
   Subjective:    [name], female    DOB: 1941/01/29, 81 y.o.   MRN: [id number]  HPI  Cough, congestion, sore throat , ear pain x 4 days  Took vitamin c , vicks States her husband was sick before this and was seen here in the office on 7/5.  No fever.  Postnasal drainage.  Hoarseness mainly in the morning.  Cough with slight shortness of breath when prolonged with slight heaviness in the chest.  Feels like congestion is in the upper part of the chest, no chest pain.  Slight wheeze yesterday.  Nonproductive cough.  Has not been able to get up any mucus.  Non-smoker.  Taking fluids well.  Voiding normal limit.  Has had some slight loose stools.       Objective:   Physical Exam NAD.  Alert, oriented.  TMs clear effusion, no erythema.  Posterior pharynx mildly injected with greenish PND noted.  Neck supple with mild soft tender anterior cervical adenopathy.  Lungs clear.  No tachypnea.  Heart regular rate rhythm.  Today's Vitals   08/22/21 1527  BP: 123/74  Pulse: 83  Temp: 97.7 F (36.5 C)  SpO2: 95%  Weight: 217 lb (98.4 kg)  Height: '5\' 5"'$  (1.651 m)   Body mass index is 36.11 kg/m.      Assessment & Plan:  Viral upper respiratory tract infection with cough Meds ordered this encounter  Medications   azithromycin (ZITHROMAX Z-PAK) 250 MG tablet    Sig: Take 2 tablets (500 mg) on  Day 1,  followed by 1 tablet (250 mg) once daily on Days 2 through 5.    Dispense:  6 each    Refill:  0    Order Specific Question:   Supervising Provider    Answer:   Sallee Lange A [[id number]]    Given samples of Mucinex DM to take as directed for cough and congestion. Do not feel she needs to start an antibiotic at this time but sent in prescription to have over the weekend in case she worsens, warning signs reviewed.  If no improvement after 7 days recommend patient's start antibiotic. Call back if worsens or persist.

## 2021-08-27 ENCOUNTER — Telehealth: Payer: Self-pay | Admitting: *Deleted

## 2021-08-27 NOTE — Telephone Encounter (Signed)
Patient was seen on 08/22/21 and was given a Z-pak. Patient isn't doing any better. She is still coughing and having a sore throat. Please advise. Thank you

## 2021-08-27 NOTE — Telephone Encounter (Signed)
Omnicef 300 mg 1 twice daily for 7 days If ongoing troubles neck step would be follow-up with [place]

## 2021-08-28 MED ORDER — CEFDINIR 300 MG PO CAPS: ORAL_CAPSULE | ORAL | 0 refills | Status: DC

## 2021-08-28 NOTE — Telephone Encounter (Signed)
Left message to return call; need to know what pharmacy pt would prefer

## 2021-08-28 NOTE — Telephone Encounter (Signed)
Pt returned call and states she uses [facility] on [address]. Antibiotic sent to pharmacy and pt is aware. Pt verbalized understanding

## 2021-09-02 DIAGNOSIS — D485 Neoplasm of uncertain behavior of skin: Secondary | ICD-10-CM | POA: Diagnosis not present

## 2021-09-02 DIAGNOSIS — L400 Psoriasis vulgaris: Secondary | ICD-10-CM | POA: Diagnosis not present

## 2021-09-02 DIAGNOSIS — L280 Lichen simplex chronicus: Secondary | ICD-10-CM | POA: Diagnosis not present

## 2021-09-02 DIAGNOSIS — B079 Viral wart, unspecified: Secondary | ICD-10-CM | POA: Diagnosis not present

## 2021-09-02 DIAGNOSIS — R202 Paresthesia of skin: Secondary | ICD-10-CM | POA: Diagnosis not present

## 2021-09-03 NOTE — Progress Notes (Signed)
Office Visit Note  Patient: Ruth [name]             Date of Birth: 1940-08-26           MRN: [id number]             PCP: Kathyrn Drown, MD Referring: Kathyrn Drown, MD Visit Date: 09/17/2021 Occupation: '@GUAROCC'$ @  Subjective:  Follow-up (Pain in thumbs, hands, feet. )   History of Present Illness: Ruth [name] is a 81 y.o. female with history of osteoarthritis and fibromyalgia syndrome.  She states she continues to have pain and discomfort in her bilateral hands and bilateral feet.  She complains of discomfort in her bilateral CMC joints and bilateral first MCP joints.  She has not noticed any joint swelling.  She states she has been having difficulty walking due to pain in her feet for the last couple of weeks.  She had right shoulder joint surgery for rotator cuff tear repair in March 2023.  She had good response to physical therapy initially.  Now her shoulder joint is causing discomfort again.  Left total replacement is doing well.  She will need right total knee replacement in the future.  She continues to have some generalized pain and discomfort from fibromyalgia.  Activities of Daily Living:  Patient reports morning stiffness for 1 hour.   Patient Reports nocturnal pain.  Difficulty dressing/grooming: Reports Difficulty climbing stairs: Reports Difficulty getting out of chair: Reports Difficulty using hands for taps, buttons, cutlery, and/or writing: Reports  Review of Systems  Constitutional:  Positive for fatigue.  HENT:  Negative for mouth sores and mouth dryness.   Eyes:  Positive for dryness.  Respiratory:  Negative for difficulty breathing.   Cardiovascular:  Positive for palpitations. Negative for chest pain.  Gastrointestinal:  Negative for blood in stool, constipation and diarrhea.  Endocrine: Positive for increased urination.  Genitourinary:  Positive for involuntary urination.  Musculoskeletal:  Positive for joint pain, joint pain, myalgias, morning  stiffness and myalgias. Negative for joint swelling.  Skin:  Negative for color change, hair loss and sensitivity to sunlight.  Allergic/Immunologic: Negative for susceptible to infections.  Neurological:  Positive for dizziness and headaches.  Hematological:  Negative for swollen glands.  Psychiatric/Behavioral:  Positive for depressed mood and sleep disturbance. The patient is nervous/anxious.     PMFS History:  Patient Active Problem List   Diagnosis Date Noted   Pain in joint of right shoulder 07/09/2020   Body mass index (BMI) 35.0-35.9, adult 06/06/2019   Cervical spondylosis with radiculopathy 06/06/2019   MDD (major depressive disorder), recurrent, in partial remission ([place]) 03/27/2019   Depression, major, single episode, mild ([place]) 03/07/2018   History of total knee replacement, left 02/21/2018   OA (osteoarthritis) of knee 02/07/2018   Non-allergic rhinitis 12/16/2017   Allergy to metal 12/16/2017   Pain in right foot 09/21/2017   Primary osteoarthritis of both feet 04/29/2017   Osteoarthritis of foot joint 04/29/2017   Hammer toe 04/23/2017   Family history of premature coronary heart disease 04/15/2016   Primary osteoarthritis of both knees 12/31/2015   Vulvodynia 08/03/2014   Anxiety disorder 07/06/2014   Fibromyalgia 04/25/2014   Prediabetes 03/18/2014   Hyperlipidemia 03/18/2014   Osteopenia 03/06/2014   History of cardiac catheterization 04/28/2013   GERD (gastroesophageal reflux disease) 02/02/2012   Indigestion 02/02/2012   Esophageal dysphagia 02/05/2011   CHEST PAIN 02/11/2009   IRRITABLE BOWEL SYNDROME 02/15/2007   Primary osteoarthritis of both hands 02/15/2007  Degenerative joint disease of hand 02/15/2007   GASTRITIS 02/14/2007    Past Medical History:  Diagnosis Date   Anemia    Anxiety    Arthritis    Asthma    Depression    Eczema    Fatty liver    Fibromyalgia    GERD (gastroesophageal reflux disease)    EGD/ colon 1/09   H pylori  ulcer 1980-1990   s/p treatment   History of kidney stones    Hyperlipidemia    IBS (irritable bowel syndrome)    Kidney cysts    Laryngospasm    Neck pain    OSA (obstructive sleep apnea)    Pneumonia 2013   PONV (postoperative nausea and vomiting)    Pre-diabetes    Refusal of blood transfusions as patient is Jehovah's Witness     Family History  Problem Relation Age of Onset   Paranoid behavior Mother        depression   Colon polyps Mother    Eczema Mother    Depression Mother    Diabetes Father    Heart attack Father        11's   Lung cancer Father    Cirrhosis Father 27       etoh cirrhosis   Eczema Father    Lung cancer Sister    Depression Sister    Alzheimer's disease Sister    Hypertension Sister    Diabetes Sister    Lung cancer Sister    Depression Maternal Aunt    COPD Daughter    Drug abuse Niece    Suicidality Niece    Allergic rhinitis Neg Hx    Asthma Neg Hx    Urticaria Neg Hx    Past Surgical History:  Procedure Laterality Date   ABDOMINAL HYSTERECTOMY  1970   BILATERAL SALPINGOOPHORECTOMY  1990s   BIOPSY  09/11/2020   Procedure: BIOPSY;  Surgeon: Rogene Houston, MD;  [facility];  Service: Endoscopy;;  gastric   CARDIAC CATHETERIZATION  2002   ([place]) normal coronary arteries    COLONOSCOPY  01/2007   Dr. Meriel Flavors   COLONOSCOPY N/A 04/18/2015   Procedure: COLONOSCOPY;  Surgeon: Rogene Houston, MD;  [facility]: [facility];  Service: Endoscopy;  Laterality: N/A;  830 - moved to 8:55 - Ann to notify pt   COLONOSCOPY WITH ESOPHAGOGASTRODUODENOSCOPY (EGD)  02/16/2012   Procedure: COLONOSCOPY WITH ESOPHAGOGASTRODUODENOSCOPY (EGD);  Surgeon: Daneil Dolin, MD;  [facility];  Service: Endoscopy;  Laterality: N/A;  8:45   COLONOSCOPY WITH PROPOFOL N/A 09/11/2020   Procedure: COLONOSCOPY WITH PROPOFOL;  Surgeon: Rogene Houston, MD;  [facility];  Service: Endoscopy;  Laterality: N/A;  10:55    ESOPHAGEAL DILATION N/A 06/07/2014   Procedure: ESOPHAGEAL DILATION;  Surgeon: Rogene Houston, MD;  [facility];  Service: Endoscopy;  Laterality: N/A;   ESOPHAGEAL DILATION N/A 09/11/2020   Procedure: ESOPHAGEAL DILATION;  Surgeon: Rogene Houston, MD;  [facility];  Service: Endoscopy;  Laterality: N/A;   ESOPHAGOGASTRODUODENOSCOPY  01/2007   Dr Sharlett Iles- 3 cm hiatal hernia, benign esophageal biopsies, erosive esophagitis, gastritis   ESOPHAGOGASTRODUODENOSCOPY N/A 06/07/2014   Procedure: ESOPHAGOGASTRODUODENOSCOPY (EGD);  Surgeon: Rogene Houston, MD;  [facility];  Service: Endoscopy;  Laterality: N/A;  1200   ESOPHAGOGASTRODUODENOSCOPY N/A 08/18/2017   Procedure: ESOPHAGOGASTRODUODENOSCOPY (EGD);  Surgeon: Rogene Houston, MD;  [facility];  Service: Endoscopy;  Laterality:  N/A;  2:20   ESOPHAGOGASTRODUODENOSCOPY (EGD) WITH PROPOFOL N/A 09/11/2020   Procedure: ESOPHAGOGASTRODUODENOSCOPY (EGD) WITH PROPOFOL;  Surgeon: Rogene Houston, MD;  [facility];  Service: Endoscopy;  Laterality: N/A;   FOOT SURGERY Left 2005   hammer toe   INNER EAR SURGERY     Left   KNEE SURGERY Left    x 2   POLYPECTOMY  09/11/2020   Procedure: POLYPECTOMY;  Surgeon: Rogene Houston, MD;  [facility];  Service: Endoscopy;;   SHOULDER ARTHROSCOPY WITH ROTATOR CUFF REPAIR Right 05/07/2021   Procedure: SHOULDER ARTHROSCOPY WITH MANIPULATION UNDER ANESTHESIA ,SUBACROMIAL DECOMPRESSION, DISTAL CLAVICLE RESECTION, ROTATOR CUFF REPAIR , BICEPS TENOTOMY;  Surgeon: Sydnee Cabal, MD;  Location: [facility];  Service: Orthopedics;  Laterality: Right;  WITH INTERSCALENE BLOCK NEEDS PRE OP CONSULT  WITH ANESTHESIA 120   SHOULDER SURGERY Left 2011   TONSILLECTOMY     age 48   TOTAL KNEE ARTHROPLASTY Left 02/07/2018   Procedure: TOTAL KNEE ARTHROPLASTY;  Surgeon: Gaynelle Arabian, MD;  Location: [facility];  Service: Orthopedics;  Laterality: Left;  74mn    TUBAL LIGATION  1970   with an appy   TYMPANOSTOMY [place]  Social History   Social History Narrative   Married 2 grown children, 1 adopted son-autistic-lives next door.   1 granddaughter   2 step grandsons   Immunization History  Administered Date(s) Administered   Influenza,inj,Quad PF,6+ Mos 12/15/2017, 11/15/2018   Influenza,inj,quad, With Preservative 11/16/2016   Influenza-Unspecified 11/22/2015, 11/01/2019, 11/30/2020   Moderna Sars-Covid-2 Vaccination 03/24/2019, 04/22/2019, 11/18/2019, 06/02/2020   Pneumococcal Conjugate-13 02/15/2014   Pneumococcal Polysaccharide-23 01/25/2020   Zoster Recombinat (Shingrix) 07/10/2016     Objective: Vital Signs: BP 117/74 (BP Location: Left Arm, Patient Position: Sitting, Cuff Size: Small)   Pulse 78   Resp 14   Ht '5\' 5"'$  (1.651 m)   Wt 218 lb 12.8 oz (99.2 kg)   BMI 36.41 kg/m    Physical Exam Vitals and nursing note reviewed.  Constitutional:      Appearance: She is well-developed.  HENT:     Head: Normocephalic and atraumatic.  Eyes:     Conjunctiva/sclera: Conjunctivae normal.  Cardiovascular:     Rate and Rhythm: Normal rate and regular rhythm.     Heart sounds: Normal heart sounds.  Pulmonary:     Effort: Pulmonary effort is normal.     Breath sounds: Normal breath sounds.  Abdominal:     General: Bowel sounds are normal.     Palpations: Abdomen is soft.  Musculoskeletal:     Cervical back: Normal range of motion.  Lymphadenopathy:     Cervical: No cervical adenopathy.  Skin:    General: Skin is warm and dry.     Capillary Refill: Capillary refill takes less than 2 seconds.  Neurological:     Mental Status: She is alert and oriented to person, place, and time.  Psychiatric:        Behavior: Behavior normal.      Musculoskeletal Exam: She had limited range of motion of the cervical spine.  Right shoulder abduction was limited to about 70 degrees which was painful.  She had limited internal  rotation and forward flexion.  Left shoulder joint was in good range of motion.  Elbow joints, wrist joints, MCPs PIPs and DIPs and good range of motion.  She had tenderness over bilateral CMC's and bilateral first MCPs but no synovitis was noted.  Hip joints were  in good range of motion.  She had limited extension of her right knee joint.  She had replaced left knee joint with some warmth on palpation.  There was no tenderness over ankles or MTPs.  She had bilateral dorsal spurs and hammertoes.  No synovitis was noted.  CDAI Exam: CDAI Score: -- Patient Global: --; Provider Global: -- Swollen: --; Tender: -- Joint Exam 09/17/2021   No joint exam has been documented for this visit   There is currently no information documented on the homunculus. Go to the Rheumatology activity and complete the homunculus joint exam.  Investigation: No additional findings.  Imaging: No results found.  Recent Labs: Lab Results  Component Value Date   WBC 5.1 04/25/2021   HGB 13.0 04/25/2021   PLT 232 04/25/2021   NA 136 04/25/2021   K 4.5 04/25/2021   CL 102 04/25/2021   CO2 25 04/25/2021   GLUCOSE 102 (H) 04/25/2021   BUN 19 04/25/2021   CREATININE 0.74 04/25/2021   BILITOT 0.3 04/25/2021   ALKPHOS 66 04/25/2021   AST 15 04/25/2021   ALT 16 04/25/2021   PROT 7.0 04/25/2021   ALBUMIN 4.2 04/25/2021   CALCIUM 9.3 04/25/2021   GFRAA 84 07/07/2018    Speciality Comments: No specialty comments available.  Procedures:  No procedures performed Allergies: Blood-group specific substance, Ciprofloxacin, Codeine, Latex, Prozac [fluoxetine], Sertraline hcl, Vicodin [hydrocodone-acetaminophen], and Tramadol   Assessment / Plan:     Visit Diagnoses: Primary osteoarthritis of both hands-she complains of pain and discomfort in her bilateral hands.  She complains of discomfort over bilateral CMC joints and bilateral MCP joints.  No synovitis was noted.  Joint protection was discussed.  A handout on hand  exercises was given.  I discussed possible CMC injections in the future if her symptoms get worse.  Chronic right shoulder pain-patient complains of pain and discomfort in her right shoulder joint.  She had right rotator cuff tear surgery in March 2023.  She had physical therapy which was helpful.  Now the pain has recurred.  She will follow-up with the orthopedic surgeon.  Primary osteoarthritis of right knee-she had limited extension.  She will require total knee replacement in the future.  Status post total knee replacement, left - December 2019 by Dr. Maureen Ralphs.  Doing well.  Primary osteoarthritis of both feet -she complains of increased pain and discomfort in her bilateral feet over the last few weeks.  No synovitis was noted.  Plan: Ambulatory referral to Podiatry.  Exercises for feet muscle strengthening were discussed.  Pes cavus of both feet -she will benefit from orthotics.  Plan: Ambulatory referral to Podiatry  Hammertoes of both feet -I will refer her to podiatrist for evaluation and orthotics.  Plan: Ambulatory referral to Podiatry  Fibromyalgia-she continues to have generalized pain and discomfort due to underlying fibromyalgia.  Need for regular exercise and stretching was emphasized.  Primary insomnia-good sleep hygiene was discussed.  Other fatigue-related to fibromyalgia and insomnia.  Osteopenia of multiple sites - advised her to have repeat DEXA with her PCP.  Coarse tremors  Orders: Orders Placed This Encounter  Procedures   Ambulatory referral to Podiatry   No orders of the defined types were placed in this encounter.    Follow-Up Instructions: Return in about 6 months (around 03/20/2022).   Bo Merino, MD  Note - This record has been created using Editor, commissioning.  Chart creation errors have been sought, but may not always  have been located. Such creation  errors do not reflect on  the standard of medical care.

## 2021-09-10 ENCOUNTER — Ambulatory Visit (INDEPENDENT_AMBULATORY_CARE_PROVIDER_SITE_OTHER): Payer: Medicare HMO | Admitting: Family Medicine

## 2021-09-10 VITALS — BP 137/69 | HR 88 | Temp 97.2°F | Ht 65.0 in | Wt 219.0 lb

## 2021-09-10 DIAGNOSIS — R6889 Other general symptoms and signs: Secondary | ICD-10-CM

## 2021-09-10 NOTE — Progress Notes (Signed)
   Subjective:    [name], female    DOB: 31-Dec-1940, 81 y.o.   MRN: [id number]  HPI Sore throat, upper chest tightness, sneezing , runny nose Body aches not feeling good feeling rundown a lot of coughing some congestion no wheezing but feels a little tight in her lungs does not feel short of breath denies high fevers  Review of Systems     Objective:   Physical Exam Throat is normal ears are normal lungs are clear hearts regular  Patient is not toxic     Assessment & Plan:  Concerning for the possibility of underlying viral illness we will go ahead and do swab Hold off on antibiotics Warning signs were discussed in detail Follow-up if progressive troubles or worse

## 2021-09-12 LAB — COVID-19, FLU A+B AND RSV
Influenza A, NAA: NOT DETECTED
Influenza B, NAA: NOT DETECTED
RSV, NAA: NOT DETECTED
SARS-CoV-2, NAA: NOT DETECTED

## 2021-09-17 ENCOUNTER — Encounter: Payer: Self-pay | Admitting: Rheumatology

## 2021-09-17 ENCOUNTER — Ambulatory Visit: Payer: Medicare HMO | Attending: Specialist | Admitting: Rheumatology

## 2021-09-17 VITALS — BP 117/74 | HR 78 | Resp 14 | Ht 65.0 in | Wt 218.8 lb

## 2021-09-17 DIAGNOSIS — M25511 Pain in right shoulder: Secondary | ICD-10-CM | POA: Insufficient documentation

## 2021-09-17 DIAGNOSIS — M19042 Primary osteoarthritis, left hand: Secondary | ICD-10-CM | POA: Insufficient documentation

## 2021-09-17 DIAGNOSIS — G8929 Other chronic pain: Secondary | ICD-10-CM

## 2021-09-17 DIAGNOSIS — M8589 Other specified disorders of bone density and structure, multiple sites: Secondary | ICD-10-CM

## 2021-09-17 DIAGNOSIS — G252 Other specified forms of tremor: Secondary | ICD-10-CM

## 2021-09-17 DIAGNOSIS — R5383 Other fatigue: Secondary | ICD-10-CM | POA: Diagnosis not present

## 2021-09-17 DIAGNOSIS — M1711 Unilateral primary osteoarthritis, right knee: Secondary | ICD-10-CM

## 2021-09-17 DIAGNOSIS — M797 Fibromyalgia: Secondary | ICD-10-CM

## 2021-09-17 DIAGNOSIS — M19041 Primary osteoarthritis, right hand: Secondary | ICD-10-CM

## 2021-09-17 DIAGNOSIS — M19071 Primary osteoarthritis, right ankle and foot: Secondary | ICD-10-CM

## 2021-09-17 DIAGNOSIS — Q6671 Congenital pes cavus, right foot: Secondary | ICD-10-CM | POA: Insufficient documentation

## 2021-09-17 DIAGNOSIS — M19072 Primary osteoarthritis, left ankle and foot: Secondary | ICD-10-CM | POA: Insufficient documentation

## 2021-09-17 DIAGNOSIS — Q6672 Congenital pes cavus, left foot: Secondary | ICD-10-CM

## 2021-09-17 DIAGNOSIS — F5101 Primary insomnia: Secondary | ICD-10-CM | POA: Diagnosis not present

## 2021-09-17 DIAGNOSIS — M2042 Other hammer toe(s) (acquired), left foot: Secondary | ICD-10-CM | POA: Insufficient documentation

## 2021-09-17 DIAGNOSIS — M2041 Other hammer toe(s) (acquired), right foot: Secondary | ICD-10-CM | POA: Diagnosis not present

## 2021-09-17 DIAGNOSIS — Z96652 Presence of left artificial knee joint: Secondary | ICD-10-CM | POA: Diagnosis not present

## 2021-09-17 NOTE — Patient Instructions (Signed)
Hand Exercises Hand exercises can be helpful for almost anyone. These exercises can strengthen the hands, improve flexibility and movement, and increase blood flow to the hands. These results can make work and daily tasks easier. Hand exercises can be especially helpful for people who have joint pain from arthritis or have nerve damage from overuse (carpal tunnel syndrome). These exercises can also help people who have injured a hand. Exercises Most of these hand exercises are gentle stretching and motion exercises. It is usually safe to do them often throughout the day. Warming up your hands before exercise may help to reduce stiffness. You can do this with gentle massage or by placing your hands in warm water for 10-15 minutes. It is normal to feel some stretching, pulling, tightness, or mild discomfort as you begin new exercises. This will gradually improve. Stop an exercise right away if you feel sudden, severe pain or your pain gets worse. Ask your health care provider which exercises are best for you. Knuckle bend or "claw" fist  Stand or sit with your arm, hand, and all five fingers pointed straight up. Make sure to keep your wrist straight during the exercise. Gently bend your fingers down toward your palm until the tips of your fingers are touching the top of your palm. Keep your big knuckle straight and just bend the small knuckles in your fingers. Hold this position for __________ seconds. Straighten (extend) your fingers back to the starting position. Repeat this exercise 5-10 times with each hand. Full finger fist  Stand or sit with your arm, hand, and all five fingers pointed straight up. Make sure to keep your wrist straight during the exercise. Gently bend your fingers into your palm until the tips of your fingers are touching the middle of your palm. Hold this position for __________ seconds. Extend your fingers back to the starting position, stretching every joint fully. Repeat  this exercise 5-10 times with each hand. Straight fist Stand or sit with your arm, hand, and all five fingers pointed straight up. Make sure to keep your wrist straight during the exercise. Gently bend your fingers at the big knuckle, where your fingers meet your hand, and the middle knuckle. Keep the knuckle at the tips of your fingers straight and try to touch the bottom of your palm. Hold this position for __________ seconds. Extend your fingers back to the starting position, stretching every joint fully. Repeat this exercise 5-10 times with each hand. Tabletop  Stand or sit with your arm, hand, and all five fingers pointed straight up. Make sure to keep your wrist straight during the exercise. Gently bend your fingers at the big knuckle, where your fingers meet your hand, as far down as you can while keeping the small knuckles in your fingers straight. Think of forming a tabletop with your fingers. Hold this position for __________ seconds. Extend your fingers back to the starting position, stretching every joint fully. Repeat this exercise 5-10 times with each hand. Finger spread  Place your hand flat on a table with your palm facing down. Make sure your wrist stays straight as you do this exercise. Spread your fingers and thumb apart from each other as far as you can until you feel a gentle stretch. Hold this position for __________ seconds. Bring your fingers and thumb tight together again. Hold this position for __________ seconds. Repeat this exercise 5-10 times with each hand. Making circles  Stand or sit with your arm, hand, and all five fingers pointed   straight up. Make sure to keep your wrist straight during the exercise. Make a circle by touching the tip of your thumb to the tip of your index finger. Hold for __________ seconds. Then open your hand wide. Repeat this motion with your thumb and each finger on your hand. Repeat this exercise 5-10 times with each hand. Thumb  motion  Sit with your forearm resting on a table and your wrist straight. Your thumb should be facing up toward the ceiling. Keep your fingers relaxed as you move your thumb. Lift your thumb up as high as you can toward the ceiling. Hold for __________ seconds. Bend your thumb across your palm as far as you can, reaching the tip of your thumb for the small finger (pinkie) side of your palm. Hold for __________ seconds. Repeat this exercise 5-10 times with each hand. Grip strengthening  Hold a stress ball or other soft ball in the middle of your hand. Slowly increase the pressure, squeezing the ball as much as you can without causing pain. Think of bringing the tips of your fingers into the middle of your palm. All of your finger joints should bend when doing this exercise. Hold your squeeze for __________ seconds, then relax. Repeat this exercise 5-10 times with each hand. Contact a health care provider if: Your hand pain or discomfort gets much worse when you do an exercise. Your hand pain or discomfort does not improve within 2 hours after you exercise. If you have any of these problems, stop doing these exercises right away. Do not do them again unless your health care provider says that you can. Get help right away if: You develop sudden, severe hand pain or swelling. If this happens, stop doing these exercises right away. Do not do them again unless your health care provider says that you can. This information is not intended to replace advice given to you by your health care provider. Make sure you discuss any questions you have with your health care provider. Document Revised: 05/23/2020 Document Reviewed: 05/23/2020 [employer] Patient Education  2023 [employer]  

## 2021-09-18 DIAGNOSIS — R3121 Asymptomatic microscopic hematuria: Secondary | ICD-10-CM | POA: Diagnosis not present

## 2021-09-18 DIAGNOSIS — R3913 Splitting of urinary stream: Secondary | ICD-10-CM | POA: Diagnosis not present

## 2021-09-24 DIAGNOSIS — Z4789 Encounter for other orthopedic aftercare: Secondary | ICD-10-CM | POA: Diagnosis not present

## 2021-09-30 NOTE — Progress Notes (Signed)
CARDIOLOGY CONSULT NOTE       [name] MRN: [id number] DOB/AGE: 80-20-1942 80 y.o.  Admit date: (Not on file) Referring Physician: Wolfgang Phoenix Primary Physician: Kathyrn Drown, MD Primary Cardiologist: New Reason for Consultation: Palpitations / Dyspnea    HPI: 81 y.o. referred by Dr Wolfgang Phoenix for Palpitations and dyspnea Had URI end of July with tighness in chest and dyspnea Rx with two different courses of antibiotics Activity limited by age and fibromyalgia in hands/feet. Had right rotator cuff surgery in March 2023 She has a right TKR Chronic GERD with previous esophageal dilatation   She had TTE done 08/11/21 which showed normal EF 08-67% normal diastolic parameters and mild LVH no valve dx Myovue done 2018 with breast attenuation no ischemia EF  78%   BNP normal 08/20/21 TSH normal Hct normal 04/25/21   ROS All other systems reviewed and negative except as noted above  Past Medical History:  Diagnosis Date   Anemia    Anxiety    Arthritis    Asthma    Depression    Eczema    Fatty liver    Fibromyalgia    GERD (gastroesophageal reflux disease)    EGD/ colon 1/09   H pylori ulcer 1980-1990   s/p treatment   History of kidney stones    Hyperlipidemia    IBS (irritable bowel syndrome)    Kidney cysts    Laryngospasm    Neck pain    OSA (obstructive sleep apnea)    Pneumonia 2013   PONV (postoperative nausea and vomiting)    Pre-diabetes    Refusal of blood transfusions as patient is Jehovah's Witness     Family History  Problem Relation Age of Onset   Paranoid behavior Mother        depression   Colon polyps Mother    Eczema Mother    Depression Mother    Diabetes Father    Heart attack Father        47's   Lung cancer Father    Cirrhosis Father 8       etoh cirrhosis   Eczema Father    Lung cancer Sister    Depression Sister    Alzheimer's disease Sister    Hypertension Sister    Diabetes Sister    Lung cancer Sister    Depression  Maternal Aunt    COPD Daughter    Drug abuse Niece    Suicidality Niece    Allergic rhinitis Neg Hx    Asthma Neg Hx    Urticaria Neg Hx     Social History   Socioeconomic History   Marital status: Married    Spouse name: Herbie [place]   Number of children: 2   Years of education: Not on file   Highest education level: Not on file  Occupational History   Occupation: Retired; Media planner  Tobacco Use   Smoking status: Never    Passive exposure: Past   Smokeless tobacco: Never  Vaping Use   Vaping Use: Never used  Substance and Sexual Activity   Alcohol use: Yes    Alcohol/week: 0.0 standard drinks of alcohol    Comment: per patient very seldom   Drug use: No   Sexual activity: Not Currently    Partners: Male    Comment: Hysterectomy  Other Topics Concern   Not on file  Social History Narrative   Married 2 grown children, 1 adopted son-autistic-lives next door.   1 granddaughter  2 step grandsons   Social Determinants of Health   Financial Resource Strain: Low Risk  (02/25/2021)   Overall Financial Resource Strain (CARDIA)    Difficulty of Paying Living Expenses: Not very hard  Food Insecurity: No Food Insecurity (02/25/2021)   Hunger Vital Sign    Worried About Running Out of Food in the Last Year: Never true    Ran Out of Food in the Last Year: Never true  Transportation Needs: No Transportation Needs (02/25/2021)   PRAPARE - Hydrologist (Medical): No    Lack of Transportation (Non-Medical): No  Physical Activity: Insufficiently Active (02/25/2021)   Exercise Vital Sign    Days of Exercise per Week: 3 days    Minutes of Exercise per Session: 10 min  Stress: Stress Concern Present (02/25/2021)   [place]    Feeling of Stress : Rather much  Social Connections: Socially Integrated (02/25/2021)   Social Connection and Isolation Panel [NHANES]    Frequency of Communication  with Friends and Family: More than three times a week    Frequency of Social Gatherings with Friends and Family: More than three times a week    Attends Religious Services: More than 4 times per year    Active Member of Clubs or Organizations: Yes    Attends Archivist Meetings: More than 4 times per year    Marital Status: Married  Human resources officer Violence: Not At Risk (02/25/2021)   Humiliation, Afraid, Rape, and Kick questionnaire    Fear of Current or Ex-Partner: No    Emotionally Abused: No    Physically Abused: No    Sexually Abused: No    Past Surgical History:  Procedure Laterality Date   [place]   BIOPSY  09/11/2020   Procedure: BIOPSY;  Surgeon: Rogene Houston, MD;  [facility];  Service: Endoscopy;;  gastric   CARDIAC CATHETERIZATION  2002   ([place]) normal coronary arteries    COLONOSCOPY  01/2007   Dr. Meriel Flavors   COLONOSCOPY N/A 04/18/2015   Procedure: COLONOSCOPY;  Surgeon: Rogene Houston, MD;  [facility];  Service: Endoscopy;  Laterality: N/A;  830 - moved to 8:55 - Ann to notify pt   COLONOSCOPY WITH ESOPHAGOGASTRODUODENOSCOPY (EGD)  02/16/2012   Procedure: COLONOSCOPY WITH ESOPHAGOGASTRODUODENOSCOPY (EGD);  Surgeon: Daneil Dolin, MD;  [facility];  Service: Endoscopy;  Laterality: N/A;  8:45   COLONOSCOPY WITH PROPOFOL N/A 09/11/2020   Procedure: COLONOSCOPY WITH PROPOFOL;  Surgeon: Rogene Houston, MD;  [facility];  Service: Endoscopy;  Laterality: N/A;  10:55   ESOPHAGEAL DILATION N/A 06/07/2014   Procedure: ESOPHAGEAL DILATION;  Surgeon: Rogene Houston, MD;  [facility];  Service: Endoscopy;  Laterality: N/A;   ESOPHAGEAL DILATION N/A 09/11/2020   Procedure: ESOPHAGEAL DILATION;  Surgeon: Rogene Houston, MD;  [facility];  Service: Endoscopy;  Laterality: N/A;   ESOPHAGOGASTRODUODENOSCOPY  01/2007   Dr  Sharlett Iles- 3 cm hiatal hernia, benign esophageal biopsies, erosive esophagitis, gastritis   ESOPHAGOGASTRODUODENOSCOPY N/A 06/07/2014   Procedure: ESOPHAGOGASTRODUODENOSCOPY (EGD);  Surgeon: Rogene Houston, MD;  [facility];  Service: Endoscopy;  Laterality: N/A;  1200   ESOPHAGOGASTRODUODENOSCOPY N/A 08/18/2017   Procedure: ESOPHAGOGASTRODUODENOSCOPY (EGD);  Surgeon: Rogene Houston, MD;  [facility];  Service: Endoscopy;  Laterality: N/A;  2:20  ESOPHAGOGASTRODUODENOSCOPY (EGD) WITH PROPOFOL N/A 09/11/2020   Procedure: ESOPHAGOGASTRODUODENOSCOPY (EGD) WITH PROPOFOL;  Surgeon: Rogene Houston, MD;  [facility];  Service: Endoscopy;  Laterality: N/A;   FOOT SURGERY Left 2005   hammer toe   INNER EAR SURGERY     Left   KNEE SURGERY Left    x 2   POLYPECTOMY  09/11/2020   Procedure: POLYPECTOMY;  Surgeon: Rogene Houston, MD;  [facility];  Service: Endoscopy;;   SHOULDER ARTHROSCOPY WITH ROTATOR CUFF REPAIR Right 05/07/2021   Procedure: SHOULDER ARTHROSCOPY WITH MANIPULATION UNDER ANESTHESIA ,SUBACROMIAL DECOMPRESSION, DISTAL CLAVICLE RESECTION, ROTATOR CUFF REPAIR , BICEPS TENOTOMY;  Surgeon: Sydnee Cabal, MD;  Location: [facility];  Service: Orthopedics;  Laterality: Right;  WITH INTERSCALENE BLOCK NEEDS PRE OP CONSULT  WITH ANESTHESIA 120   SHOULDER SURGERY Left 2011   TONSILLECTOMY     age 33   TOTAL KNEE ARTHROPLASTY Left 02/07/2018   Procedure: TOTAL KNEE ARTHROPLASTY;  Surgeon: Gaynelle Arabian, MD;  Location: [facility];  Service: Orthopedics;  Laterality: Left;  37mn   TUBAL LIGATION  1970   with an appy   TYMPANOSTOMY TUBE PLACEMENT  1984      Current Outpatient Medications:    acetaminophen (TYLENOL) 500 MG tablet, Take 1,000 mg by mouth every 6 (six) hours as needed for moderate pain., Disp: , Rfl:    acidophilus (RISAQUAD) CAPS capsule, Take 1 capsule by mouth 4 (four) times a week., Disp: , Rfl:    albuterol (PROVENTIL  HFA;VENTOLIN HFA) 108 (90 Base) MCG/ACT inhaler, Inhale 2 puffs into the lungs every 6 (six) hours as needed for wheezing., Disp: 1 Inhaler, Rfl: 2   Ascorbic Acid (VITAMIN C PO), Take 1 tablet by mouth 4 (four) times a week., Disp: , Rfl:    b complex vitamins capsule, Take 1 capsule by mouth 4 (four) times a week., Disp: , Rfl:    diazepam (VALIUM) 5 MG tablet, 1/2 to 1 taken 3 times daily as needed for severe anxiousness use sparingly (Patient taking differently: Take 2.5-5 mg by mouth 3 (three) times daily as needed for anxiety. 1/2 to 1 taken 3 times daily as needed for severe anxiousness use sparingly), Disp: 30 tablet, Rfl: 1   dicyclomine (BENTYL) 10 MG capsule, Take 1 capsule (10 mg total) by mouth 3 (three) times daily as needed. For stomach cramps (Patient taking differently: Take 10 mg by mouth 3 (three) times daily as needed (stomach cramps).), Disp: 90 capsule, Rfl: 5   famotidine (PEPCID) 40 MG tablet, TAKE 1 TABLET  BY MOUTH AT BEDTIME. (Patient taking differently: Take 40 mg by mouth daily as needed.), Disp: 90 tablet, Rfl: 1   FLUoxetine (PROZAC) 10 MG capsule, Take 1 capsule (10 mg total) by mouth daily., Disp: 90 capsule, Rfl: 1   fluticasone (CUTIVATE) 0.05 % cream, Apply 1 application topically 2 (two) times daily., Disp: , Rfl:    ketoconazole (NIZORAL) 2 % cream, Apply 1 application topically daily. (Patient taking differently: Apply 1 application  topically daily as needed for irritation.), Disp: 60 g, Rfl: 5   MAGNESIUM PO, Take 1 tablet by mouth 4 (four) times a week., Disp: , Rfl:    mometasone (ELOCON) 0.1 % cream, as needed., Disp: , Rfl:    Multiple Vitamins-Minerals (ZINC PO), Take 1 tablet by mouth daily., Disp: , Rfl:    nitroGLYCERIN (NITROSTAT) 0.4 MG SL tablet, as needed., Disp: , Rfl:    Polyvinyl Alcohol (LUBRICANT DROPS OP), Place  2 drops into both eyes 3 (three) times daily as needed (for dry eyes)., Disp: , Rfl:    predniSONE (DELTASONE) 10 MG tablet, '60mg'$  x 2  days, '50mg'$  x 2 days, '40mg'$  x 2 days, '30mg'$  x 2 days, '20mg'$  x 2 days, '10mg'$  x 2 days, Disp: , Rfl:    triamcinolone cream (KENALOG) 0.1 %, as needed., Disp: , Rfl:    methocarbamol (ROBAXIN) 500 MG tablet, Take 1 tablet (500 mg total) by mouth 4 (four) times daily. (Patient not taking: Reported on 10/01/2021), Disp: 40 tablet, Rfl: 0    Physical Exam: Blood pressure 120/68, pulse 79, height '5\' 5"'$  (1.651 m), weight 221 lb 12.8 oz (100.6 kg), SpO2 94 %.    Affect appropriate Healthy:  appears stated age 48: normal Neck supple with no adenopathy JVP normal no bruits no thyromegaly Lungs clear with no wheezing and good diaphragmatic motion Heart:  S1/S2 no murmur, no rub, gallop or click PMI normal Abdomen: benighn, BS positve, no tenderness, no AAA no bruit.  No HSM or HJR Distal pulses intact with no bruits No edema Neuro non-focal Skin warm and dry No muscular weakness   Labs:   Lab Results  Component Value Date   WBC 5.1 04/25/2021   HGB 13.0 04/25/2021   HCT 39.9 04/25/2021   MCV 97.3 04/25/2021   PLT 232 04/25/2021   No results for input(s): "NA", "K", "CL", "CO2", "BUN", "CREATININE", "CALCIUM", "PROT", "BILITOT", "ALKPHOS", "ALT", "AST", "GLUCOSE" in the last 168 hours.  Invalid input(s): "LABALBU" Lab Results  Component Value Date   CKTOTAL <1.0 02/01/2009   TROPONINI <0.03 12/30/2016    Lab Results  Component Value Date   CHOL 269 (H) 04/18/2020   CHOL 244 (H) 11/15/2018   CHOL 253 (H) 09/23/2017   Lab Results  Component Value Date   HDL 59 04/18/2020   HDL 51 11/15/2018   HDL 49 09/23/2017   Lab Results  Component Value Date   LDLCALC 198 (H) 04/18/2020   LDLCALC 171 (H) 11/15/2018   LDLCALC 183 (H) 09/23/2017   Lab Results  Component Value Date   TRIG 74 04/18/2020   TRIG 124 11/15/2018   TRIG 103 09/23/2017   Lab Results  Component Value Date   CHOLHDL 4.6 (H) 04/18/2020   CHOLHDL 4.8 (H) 11/15/2018   CHOLHDL 5.2 (H) 09/23/2017   No  results found for: "LDLDIRECT"    Radiology: No results found.  EKG: SR rate 90 normal 07/23/21 10/01/2021 NSR rate 79 normal    ASSESSMENT AND PLAN:   Dyspnea:  frequent URI this spring Echo benign normal ECG not likely related to heart BNP also normal Primary should consider non contrast CT chest  Palpitations:  benign given age, normal ECG and echo no need for monitor Fibromyalgia:  limits lifestyle most with history of shoulder/knee replacements F/U rheum GERD:  history of esophageal dilatation stable continue pepcid  CAD Risk: recommended calcium score to further risk stratify   Calcium score   F/U cardiology PRN   Signed: Jenkins Rouge 10/01/2021, 1:25 PM

## 2021-10-01 ENCOUNTER — Encounter: Payer: Self-pay | Admitting: Cardiovascular Disease

## 2021-10-01 ENCOUNTER — Ambulatory Visit: Payer: Medicare HMO | Admitting: Cardiovascular Disease

## 2021-10-01 VITALS — BP 120/68 | HR 79 | Ht 65.0 in | Wt 221.8 lb

## 2021-10-01 DIAGNOSIS — R002 Palpitations: Secondary | ICD-10-CM

## 2021-10-01 DIAGNOSIS — K219 Gastro-esophageal reflux disease without esophagitis: Secondary | ICD-10-CM

## 2021-10-01 DIAGNOSIS — R0600 Dyspnea, unspecified: Secondary | ICD-10-CM | POA: Diagnosis not present

## 2021-10-01 NOTE — Patient Instructions (Addendum)
Medication Instructions:  Your physician recommends that you continue on your current medications as directed. Please refer to the Current Medication list given to you today.  *If you need a refill on your cardiac medications before your next appointment, please call your pharmacy*  Lab Work: If you have labs (blood work) drawn today and your tests are completely normal, you will receive your results only by: [place] (if you have MyChart) OR A paper copy in the mail If you have any lab test that is abnormal or we need to change your treatment, we will call you to review the results.  Testing/Procedures: Cardiac CT scanning, calcium score, (CAT scanning), is a noninvasive, special x-ray that produces cross-sectional images of the body using x-rays and a computer. CT scans help physicians diagnose and treat medical conditions. For some CT exams, a contrast material is used to enhance visibility in the area of the body being studied. CT scans provide greater clarity and reveal more details than regular x-ray exams.  Follow-Up: At [facility], you and your health needs are our priority.  As part of our continuing mission to provide you with exceptional heart care, we have created designated Provider Care Teams.  These Care Teams include your primary Cardiologist (physician) and Advanced Practice Providers (APPs -  Physician Assistants and Nurse Practitioners) who all work together to provide you with the care you need, when you need it.  We recommend signing up for the patient portal called "MyChart".  Sign up information is provided on this After Visit Summary.  MyChart is used to connect with patients for Virtual Visits (Telemedicine).  Patients are able to view lab/test results, encounter notes, upcoming appointments, etc.  Non-urgent messages can be sent to your provider as well.   To learn more about what you can do with MyChart, go to [email or web address].    Your next  appointment:   1 year  The format for your next appointment:   In Person  Provider:   Jenkins Rouge, MD {   Important Information About Sugar

## 2021-10-16 ENCOUNTER — Ambulatory Visit (HOSPITAL_COMMUNITY)
Admission: RE | Admit: 2021-10-16 | Discharge: 2021-10-16 | Disposition: A | Discharge status: Home / Self Care | Payer: Medicare HMO | Source: Ambulatory Visit | Attending: Cardiovascular Disease | Admitting: Cardiovascular Disease

## 2021-10-16 DIAGNOSIS — R002 Palpitations: Secondary | ICD-10-CM | POA: Insufficient documentation

## 2021-10-16 DIAGNOSIS — R0600 Dyspnea, unspecified: Secondary | ICD-10-CM | POA: Insufficient documentation

## 2021-10-16 DIAGNOSIS — K219 Gastro-esophageal reflux disease without esophagitis: Secondary | ICD-10-CM | POA: Insufficient documentation

## 2021-10-17 DIAGNOSIS — M25552 Pain in left hip: Secondary | ICD-10-CM | POA: Diagnosis not present

## 2021-10-17 DIAGNOSIS — M25571 Pain in right ankle and joints of right foot: Secondary | ICD-10-CM | POA: Diagnosis not present

## 2021-10-24 DIAGNOSIS — Z4789 Encounter for other orthopedic aftercare: Secondary | ICD-10-CM | POA: Diagnosis not present

## 2021-10-27 DIAGNOSIS — R5382 Chronic fatigue, unspecified: Secondary | ICD-10-CM | POA: Diagnosis not present

## 2021-10-27 DIAGNOSIS — M797 Fibromyalgia: Secondary | ICD-10-CM | POA: Diagnosis not present

## 2021-10-27 DIAGNOSIS — F332 Major depressive disorder, recurrent severe without psychotic features: Secondary | ICD-10-CM | POA: Diagnosis not present

## 2021-10-30 DIAGNOSIS — M25552 Pain in left hip: Secondary | ICD-10-CM | POA: Insufficient documentation

## 2021-11-10 ENCOUNTER — Ambulatory Visit: Payer: Medicare HMO | Admitting: Podiatry

## 2021-11-10 DIAGNOSIS — M542 Cervicalgia: Secondary | ICD-10-CM | POA: Diagnosis not present

## 2021-11-10 DIAGNOSIS — M47812 Spondylosis without myelopathy or radiculopathy, cervical region: Secondary | ICD-10-CM | POA: Diagnosis not present

## 2021-11-13 DIAGNOSIS — H04123 Dry eye syndrome of bilateral lacrimal glands: Secondary | ICD-10-CM | POA: Diagnosis not present

## 2021-11-13 DIAGNOSIS — H25813 Combined forms of age-related cataract, bilateral: Secondary | ICD-10-CM | POA: Diagnosis not present

## 2021-11-13 DIAGNOSIS — H40013 Open angle with borderline findings, low risk, bilateral: Secondary | ICD-10-CM | POA: Diagnosis not present

## 2021-11-13 DIAGNOSIS — H43391 Other vitreous opacities, right eye: Secondary | ICD-10-CM | POA: Diagnosis not present

## 2021-11-17 DIAGNOSIS — N3941 Urge incontinence: Secondary | ICD-10-CM | POA: Diagnosis not present

## 2021-11-17 DIAGNOSIS — R3915 Urgency of urination: Secondary | ICD-10-CM | POA: Diagnosis not present

## 2021-11-17 DIAGNOSIS — R319 Hematuria, unspecified: Secondary | ICD-10-CM | POA: Diagnosis not present

## 2021-11-17 DIAGNOSIS — R350 Frequency of micturition: Secondary | ICD-10-CM | POA: Diagnosis not present

## 2021-11-17 DIAGNOSIS — R310 Gross hematuria: Secondary | ICD-10-CM | POA: Diagnosis not present

## 2021-11-18 DIAGNOSIS — H25813 Combined forms of age-related cataract, bilateral: Secondary | ICD-10-CM | POA: Diagnosis not present

## 2021-11-18 DIAGNOSIS — H43391 Other vitreous opacities, right eye: Secondary | ICD-10-CM | POA: Diagnosis not present

## 2021-11-18 DIAGNOSIS — H40013 Open angle with borderline findings, low risk, bilateral: Secondary | ICD-10-CM | POA: Diagnosis not present

## 2021-11-18 DIAGNOSIS — H1045 Other chronic allergic conjunctivitis: Secondary | ICD-10-CM | POA: Diagnosis not present

## 2021-11-18 DIAGNOSIS — H04123 Dry eye syndrome of bilateral lacrimal glands: Secondary | ICD-10-CM | POA: Diagnosis not present

## 2021-11-24 ENCOUNTER — Ambulatory Visit (INDEPENDENT_AMBULATORY_CARE_PROVIDER_SITE_OTHER): Payer: Medicare HMO | Admitting: Family Medicine

## 2021-11-24 ENCOUNTER — Encounter: Payer: Self-pay | Admitting: Family Medicine

## 2021-11-24 VITALS — BP 122/80 | Wt 221.8 lb

## 2021-11-24 DIAGNOSIS — M170 Bilateral primary osteoarthritis of knee: Secondary | ICD-10-CM | POA: Diagnosis not present

## 2021-11-24 DIAGNOSIS — M19071 Primary osteoarthritis, right ankle and foot: Secondary | ICD-10-CM | POA: Diagnosis not present

## 2021-11-24 DIAGNOSIS — M797 Fibromyalgia: Secondary | ICD-10-CM

## 2021-11-24 DIAGNOSIS — M19072 Primary osteoarthritis, left ankle and foot: Secondary | ICD-10-CM | POA: Diagnosis not present

## 2021-11-24 MED ORDER — OXYCODONE HCL 5 MG PO TABS: ORAL_TABLET | ORAL | 0 refills | Status: DC

## 2021-11-24 MED ORDER — DICYCLOMINE HCL 10 MG PO CAPS
10.0000 mg | ORAL_CAPSULE | Freq: Three times a day (TID) | ORAL | 5 refills | Status: AC | PRN
Start: 2021-11-24 — End: ?

## 2021-11-24 NOTE — Progress Notes (Signed)
   Subjective:    [name], female    DOB: 1940-02-27, 81 y.o.   MRN: [id number]  HPI Pt arrives to discuss kidney/bladder issue,  pain issues, and all over itching.   Pt also requesting refill for Dicyclomine.   Pain in neck,knee, shoulder,hands Fibromyalgia Patient relates pain in her shoulders her hands her knees her neck she also relates muscle pain and discomfort She has tried Tylenol without success.  She also has tried OTC anti-inflammatories but they do not agree with her In the past hydrocodone and codeine causes nausea but she has taken Percocet before without trouble She also relates how physical therapy in the past has not helped.  Stretching exercises have not helped.  Her quality of life she feels is poor She states in the past when she has taken a half a tablet of pain medicine that is helped her she does not feel that she would use it often she states she has no desire whatsoever to be on it frequently.  She does have depression but it is under decent control she has not had a history of substance abuse in the past  Review of Systems     Objective:   Physical Exam  General-in no acute distress Eyes-no discharge Lungs-respiratory rate normal, CTA CV-no murmurs,RRR Extremities skin warm dry no edema Neuro grossly normal Behavior normal, alert       Assessment & Plan:  Pain management-patient has already tried gabapentin and Cymbalta without any success She cannot tolerate anti-inflammatories Currently doing the acetaminophen approximately 2000-3000 daily She states does not tolerate tramadol codeine or Vicodin In the past she has used oxycodone sparingly to help with pain postoperative she tolerated this okay She is requesting to be on a low-dose of pain medicine intermittently to help her with her symptoms  Shared discussion including risk of pain medicine patient does not have any history of alcohol or drug abuse We also told her that  she cannot take benzodiazepines with this medicine She is open with going ahead with starting low-dose oxycodone she was given a prescription for 10 tablets to follow-up in approximately 2 to 3 weeks Over 30 minutes spent with patient Pain management contract and U DT on follow-up

## 2021-11-26 ENCOUNTER — Other Ambulatory Visit: Payer: Self-pay | Admitting: Family Medicine

## 2021-11-26 ENCOUNTER — Telehealth: Payer: Self-pay | Admitting: Family Medicine

## 2021-11-26 DIAGNOSIS — R300 Dysuria: Secondary | ICD-10-CM

## 2021-11-26 LAB — POCT URINALYSIS DIP (CLINITEK)
Spec Grav, UA: <=1.005 — AB (ref 1.010–1.025)
pH, UA: 7.5 (ref 5.0–8.0)

## 2021-11-26 NOTE — Telephone Encounter (Signed)
Pt dropped off urine. Urine spun and dip completed. Please advise. Thank you  Results for orders placed or performed in visit on 11/26/21  POCT URINALYSIS DIP (CLINITEK)  Result Value Ref Range   Color, UA     Clarity, UA     Glucose, UA     Bilirubin, UA     Ketones, POC UA     Spec Grav, UA <=1.005 (A) 1.010 - 1.025   Blood, UA small (A) negative   pH, UA 7.5 5.0 - 8.0   POC PROTEIN,UA     Urobilinogen, UA     Nitrite, UA     Leukocytes, UA Moderate (2+) (A) Negative

## 2021-11-26 NOTE — Telephone Encounter (Signed)
Sent for urine culture await results thank you

## 2021-11-27 DIAGNOSIS — R5382 Chronic fatigue, unspecified: Secondary | ICD-10-CM | POA: Diagnosis not present

## 2021-11-27 DIAGNOSIS — M797 Fibromyalgia: Secondary | ICD-10-CM | POA: Diagnosis not present

## 2021-11-27 DIAGNOSIS — F332 Major depressive disorder, recurrent severe without psychotic features: Secondary | ICD-10-CM | POA: Diagnosis not present

## 2021-11-27 NOTE — Telephone Encounter (Signed)
Pt contacted and verbalized understanding.  

## 2021-11-28 LAB — URINE CULTURE

## 2021-12-08 DIAGNOSIS — M7501 Adhesive capsulitis of right shoulder: Secondary | ICD-10-CM | POA: Diagnosis not present

## 2021-12-08 DIAGNOSIS — Z4789 Encounter for other orthopedic aftercare: Secondary | ICD-10-CM | POA: Diagnosis not present

## 2021-12-15 ENCOUNTER — Ambulatory Visit: Payer: Medicare HMO | Admitting: Family Medicine

## 2021-12-16 ENCOUNTER — Ambulatory Visit (INDEPENDENT_AMBULATORY_CARE_PROVIDER_SITE_OTHER): Payer: Medicare HMO | Admitting: Family Medicine

## 2021-12-16 DIAGNOSIS — M170 Bilateral primary osteoarthritis of knee: Secondary | ICD-10-CM

## 2021-12-16 DIAGNOSIS — M25611 Stiffness of right shoulder, not elsewhere classified: Secondary | ICD-10-CM | POA: Insufficient documentation

## 2021-12-16 DIAGNOSIS — M7501 Adhesive capsulitis of right shoulder: Secondary | ICD-10-CM | POA: Insufficient documentation

## 2021-12-16 MED ORDER — OXYCODONE HCL 5 MG PO TABS: ORAL_TABLET | ORAL | 0 refills | Status: DC

## 2021-12-16 MED ORDER — ONDANSETRON HCL 4 MG PO TABS: 4.0000 mg | ORAL_TABLET | Freq: Three times a day (TID) | ORAL | 0 refills | Status: AC | PRN

## 2021-12-16 NOTE — Progress Notes (Signed)
   Subjective:    [name], female    DOB: Aug 27, 1940, 81 y.o.   MRN: [id number]  HPI    Review of Systems     Objective:   Physical Exam        Assessment & Plan:

## 2021-12-16 NOTE — Patient Instructions (Signed)
Pick up pain med on Friday  Use sparingly 1/2 tablet once or twice a day as needed for severe pain

## 2021-12-16 NOTE — Progress Notes (Signed)
   Subjective:    [name], female    DOB: [birth year], 81 y.o.   MRN: [id number]  HPI Patient has chronic pain of her back and her knees On previous visit she was stating how this was a severe impediment to her quality of life Tramadol did not help Hydrocodone caused side effects After a shared discussion we will use low-dose oxycodone Patient states she is using this sparingly She does not drive with this medicine She does have a history of depression but is stable currently No suicidal ideation    Review of Systems     Objective:   Physical Exam  Lungs clear heart regular Severe arthritis of the knees noted  Advised not to drive when taking pain medicine    Assessment & Plan:  Chronic pain and discomfort with osteoarthritis of the knees chronic pain of the back as well as fibromyalgia Pain medicine is used sparingly for her severe osteoarthritis she states she tolerates it well with some slight nausea no dizziness she uses it sparingly half tablet no more than twice daily caution drowsiness 15.  Given Follow-up in 1 month Potentially at that time if doing well issue 3 scripts, U DT, pain contract

## 2021-12-17 DIAGNOSIS — M25611 Stiffness of right shoulder, not elsewhere classified: Secondary | ICD-10-CM | POA: Diagnosis not present

## 2021-12-17 DIAGNOSIS — M25511 Pain in right shoulder: Secondary | ICD-10-CM | POA: Diagnosis not present

## 2021-12-17 DIAGNOSIS — M7501 Adhesive capsulitis of right shoulder: Secondary | ICD-10-CM | POA: Diagnosis not present

## 2021-12-19 DIAGNOSIS — M25511 Pain in right shoulder: Secondary | ICD-10-CM | POA: Diagnosis not present

## 2021-12-19 DIAGNOSIS — M25611 Stiffness of right shoulder, not elsewhere classified: Secondary | ICD-10-CM | POA: Diagnosis not present

## 2021-12-19 DIAGNOSIS — M7501 Adhesive capsulitis of right shoulder: Secondary | ICD-10-CM | POA: Diagnosis not present

## 2021-12-23 DIAGNOSIS — M25511 Pain in right shoulder: Secondary | ICD-10-CM | POA: Diagnosis not present

## 2021-12-23 DIAGNOSIS — M7501 Adhesive capsulitis of right shoulder: Secondary | ICD-10-CM | POA: Diagnosis not present

## 2021-12-23 DIAGNOSIS — M25611 Stiffness of right shoulder, not elsewhere classified: Secondary | ICD-10-CM | POA: Diagnosis not present

## 2021-12-25 DIAGNOSIS — M25611 Stiffness of right shoulder, not elsewhere classified: Secondary | ICD-10-CM | POA: Diagnosis not present

## 2021-12-25 DIAGNOSIS — M7501 Adhesive capsulitis of right shoulder: Secondary | ICD-10-CM | POA: Diagnosis not present

## 2021-12-25 DIAGNOSIS — M25511 Pain in right shoulder: Secondary | ICD-10-CM | POA: Diagnosis not present

## 2021-12-30 DIAGNOSIS — M25511 Pain in right shoulder: Secondary | ICD-10-CM | POA: Diagnosis not present

## 2021-12-30 DIAGNOSIS — M25611 Stiffness of right shoulder, not elsewhere classified: Secondary | ICD-10-CM | POA: Diagnosis not present

## 2021-12-30 DIAGNOSIS — M7501 Adhesive capsulitis of right shoulder: Secondary | ICD-10-CM | POA: Diagnosis not present

## 2022-01-04 ENCOUNTER — Encounter (INDEPENDENT_AMBULATORY_CARE_PROVIDER_SITE_OTHER): Payer: Self-pay | Admitting: Gastroenterology

## 2022-01-07 DIAGNOSIS — M25611 Stiffness of right shoulder, not elsewhere classified: Secondary | ICD-10-CM | POA: Diagnosis not present

## 2022-01-07 DIAGNOSIS — M7501 Adhesive capsulitis of right shoulder: Secondary | ICD-10-CM | POA: Diagnosis not present

## 2022-01-07 DIAGNOSIS — M25511 Pain in right shoulder: Secondary | ICD-10-CM | POA: Diagnosis not present

## 2022-01-13 ENCOUNTER — Ambulatory Visit (INDEPENDENT_AMBULATORY_CARE_PROVIDER_SITE_OTHER): Payer: Medicare HMO | Admitting: Family Medicine

## 2022-01-13 VITALS — BP 118/60 | HR 78 | Temp 97.5°F | Ht 65.0 in | Wt 211.0 lb

## 2022-01-13 DIAGNOSIS — M170 Bilateral primary osteoarthritis of knee: Secondary | ICD-10-CM | POA: Diagnosis not present

## 2022-01-13 DIAGNOSIS — R1012 Left upper quadrant pain: Secondary | ICD-10-CM | POA: Diagnosis not present

## 2022-01-13 MED ORDER — OXYCODONE HCL 5 MG PO TABS: ORAL_TABLET | ORAL | 0 refills | Status: DC

## 2022-01-13 MED ORDER — FAMOTIDINE 40 MG PO TABS: 40.0000 mg | ORAL_TABLET | Freq: Every day | ORAL | 1 refills | Status: DC | PRN

## 2022-01-13 NOTE — Progress Notes (Signed)
   Subjective:    Patient ID: [name], female    DOB: 1940/12/16, 81 y.o.   MRN: [id number]  HPI  -Multiple joint pain , shoulder , knees, and  hands - currently on PT will have injection Friday  Patient has chronic pain.  She does have fibromyalgia but her main concern is the severe pain in her knees as well as her back and hips.  Her quality of life is very miserable.  Anti-inflammatories have not helped her pain.  She has taken Vicodin and tramadol in the past but had severe nausea.  She has use small amount of oxycodone in the past and it did not help.  She denies drowsiness with just some slight nausea over the past month she is taking half tablets when necessary but not taking more than a full tablet per day  We had a long discussion regarding pluses and minuses --Pain in left upper quad area that comes and goes , non radiating, dull pain x 1 month  Does not radiate through the back.  No vomiting with it.  No bloody stools. -Left ear fullness and left side of throat soreness  Review of Systems     Objective:   Physical Exam  General-in no acute distress Eyes-no discharge Lungs-respiratory rate normal, CTA CV-no murmurs,RRR Extremities skin warm dry no edema Neuro grossly normal Behavior normal, alert       Assessment & Plan:  1. LUQ abdominal pain Will restart famotidine.  Healthy diet.  Check lab work.  Follow-up in 3 weeks if ongoing troubles referral back to GI - CBC with Differential - Basic Metabolic Panel - Hepatic Function Panel - Amylase  2. Primary osteoarthritis of both knees Uses occasional half tablet of oxycodone as needed for pain we will give her 18 tablets to last the next month we have discussed the benefits and risk of ongoing use of opioids we will try to stick with just lower amounts.  Patient has not been able to get adequate relief of her osteoarthritis through other measures.  She states her quality of life is very poor without some pain  control - CBC with Differential - Basic Metabolic Panel - Hepatic Function Panel - Amylase  I find no evidence of a urine infection currently.  More than likely this is just environmental congestion.  Certainly if he gets worse follow-up

## 2022-01-15 DIAGNOSIS — M25611 Stiffness of right shoulder, not elsewhere classified: Secondary | ICD-10-CM | POA: Diagnosis not present

## 2022-01-15 DIAGNOSIS — M25511 Pain in right shoulder: Secondary | ICD-10-CM | POA: Diagnosis not present

## 2022-01-15 DIAGNOSIS — M7501 Adhesive capsulitis of right shoulder: Secondary | ICD-10-CM | POA: Diagnosis not present

## 2022-01-15 DIAGNOSIS — M170 Bilateral primary osteoarthritis of knee: Secondary | ICD-10-CM | POA: Diagnosis not present

## 2022-01-15 DIAGNOSIS — R1012 Left upper quadrant pain: Secondary | ICD-10-CM | POA: Diagnosis not present

## 2022-01-16 DIAGNOSIS — M25611 Stiffness of right shoulder, not elsewhere classified: Secondary | ICD-10-CM | POA: Diagnosis not present

## 2022-01-16 DIAGNOSIS — M25511 Pain in right shoulder: Secondary | ICD-10-CM | POA: Diagnosis not present

## 2022-01-16 DIAGNOSIS — M7501 Adhesive capsulitis of right shoulder: Secondary | ICD-10-CM | POA: Diagnosis not present

## 2022-01-16 LAB — BASIC METABOLIC PANEL
BUN/Creatinine Ratio: 23 (ref 12–28)
BUN: 16 mg/dL (ref 8–27)
CO2: 24 mmol/L (ref 20–29)
Calcium: 9.8 mg/dL (ref 8.7–10.3)
Chloride: 100 mmol/L (ref 96–106)
Creatinine, Ser: 0.7 mg/dL (ref 0.57–1.00)
Glucose: 87 mg/dL (ref 70–99)
Potassium: 4 mmol/L (ref 3.5–5.2)
Sodium: 142 mmol/L (ref 134–144)
eGFR: 87 mL/min/{1.73_m2} (ref >59)

## 2022-01-16 LAB — HEPATIC FUNCTION PANEL
ALT: 13 IU/L (ref 0–32)
AST: 17 IU/L (ref 0–40)
Albumin: 4.5 g/dL (ref 3.8–4.8)
Alkaline Phosphatase: 67 IU/L (ref 44–121)
Bilirubin Total: 0.3 mg/dL (ref 0.0–1.2)
Bilirubin, Direct: <0.1 mg/dL (ref 0.00–0.40)
Total Protein: 6.7 g/dL (ref 6.0–8.5)

## 2022-01-16 LAB — CBC WITH DIFFERENTIAL/PLATELET
Basophils Absolute: 0.1 10*3/uL (ref 0.0–0.2)
Basos: 1 %
EOS (ABSOLUTE): 0.2 10*3/uL (ref 0.0–0.4)
Eos: 3 %
Hematocrit: 41 % (ref 34.0–46.6)
Hemoglobin: 13.6 g/dL (ref 11.1–15.9)
Immature Grans (Abs): 0 10*3/uL (ref 0.0–0.1)
Immature Granulocytes: 0 %
Lymphocytes Absolute: 1.8 10*3/uL (ref 0.7–3.1)
Lymphs: 27 %
MCH: 31.3 pg (ref 26.6–33.0)
MCHC: 33.2 g/dL (ref 31.5–35.7)
MCV: 94 fL (ref 79–97)
Monocytes Absolute: 0.6 10*3/uL (ref 0.1–0.9)
Monocytes: 9 %
Neutrophils Absolute: 4 10*3/uL (ref 1.4–7.0)
Neutrophils: 60 %
Platelets: 249 10*3/uL (ref 150–450)
RBC: 4.35 x10E6/uL (ref 3.77–5.28)
RDW: 13.1 % (ref 11.7–15.4)
WBC: 6.7 10*3/uL (ref 3.4–10.8)

## 2022-01-16 LAB — AMYLASE: Amylase: 40 U/L (ref 31–110)

## 2022-01-20 DIAGNOSIS — M7501 Adhesive capsulitis of right shoulder: Secondary | ICD-10-CM | POA: Diagnosis not present

## 2022-01-20 DIAGNOSIS — M25511 Pain in right shoulder: Secondary | ICD-10-CM | POA: Diagnosis not present

## 2022-01-20 DIAGNOSIS — M25611 Stiffness of right shoulder, not elsewhere classified: Secondary | ICD-10-CM | POA: Diagnosis not present

## 2022-01-22 DIAGNOSIS — M25611 Stiffness of right shoulder, not elsewhere classified: Secondary | ICD-10-CM | POA: Diagnosis not present

## 2022-01-22 DIAGNOSIS — M7501 Adhesive capsulitis of right shoulder: Secondary | ICD-10-CM | POA: Diagnosis not present

## 2022-01-22 DIAGNOSIS — M25511 Pain in right shoulder: Secondary | ICD-10-CM | POA: Diagnosis not present

## 2022-01-28 DIAGNOSIS — M25511 Pain in right shoulder: Secondary | ICD-10-CM | POA: Diagnosis not present

## 2022-01-28 DIAGNOSIS — M25611 Stiffness of right shoulder, not elsewhere classified: Secondary | ICD-10-CM | POA: Diagnosis not present

## 2022-01-28 DIAGNOSIS — M7501 Adhesive capsulitis of right shoulder: Secondary | ICD-10-CM | POA: Diagnosis not present

## 2022-02-03 ENCOUNTER — Ambulatory Visit: Payer: Medicare HMO | Admitting: Family Medicine

## 2022-02-03 ENCOUNTER — Ambulatory Visit (INDEPENDENT_AMBULATORY_CARE_PROVIDER_SITE_OTHER): Payer: Medicare HMO | Admitting: Family Medicine

## 2022-02-03 VITALS — BP 128/70 | HR 77 | Temp 97.2°F | Ht 65.0 in | Wt 208.0 lb

## 2022-02-03 DIAGNOSIS — Z0100 Encounter for examination of eyes and vision without abnormal findings: Secondary | ICD-10-CM | POA: Diagnosis not present

## 2022-02-03 DIAGNOSIS — N8110 Cystocele, unspecified: Secondary | ICD-10-CM | POA: Diagnosis not present

## 2022-02-03 DIAGNOSIS — M797 Fibromyalgia: Secondary | ICD-10-CM | POA: Diagnosis not present

## 2022-02-03 DIAGNOSIS — H524 Presbyopia: Secondary | ICD-10-CM | POA: Diagnosis not present

## 2022-02-03 DIAGNOSIS — M2550 Pain in unspecified joint: Secondary | ICD-10-CM | POA: Diagnosis not present

## 2022-02-03 MED ORDER — OXYCODONE HCL 5 MG PO TABS: 5.0000 mg | ORAL_TABLET | ORAL | 0 refills | Status: DC | PRN

## 2022-02-03 MED ORDER — ALBUTEROL SULFATE HFA 108 (90 BASE) MCG/ACT IN AERS: 2.0000 | INHALATION_SPRAY | Freq: Four times a day (QID) | RESPIRATORY_TRACT | 2 refills | Status: AC | PRN

## 2022-02-03 NOTE — Progress Notes (Signed)
   Subjective:    [name], female    DOB: 1940/05/15, 81 y.o.   MRN: [id number]  HPI  Follow up for osteoarthritis Very nice patient Having a lot of pain and discomfort that is intermittent Has underlying fibromyalgia as well as osteoarthritis She has tried numerous medications her husband is here with her today She has tried Tylenol without success she cannot tolerate anti-inflammatories Cymbalta caused side effects She is not a good candidate for amitriptyline She has tried gabapentin and pregabalin in the past without success She uses oxycodone sparingly she knows not to use it frequently to avoid potential for addiction Review of Systems     Objective:   Physical Exam  General-in no acute distress Eyes-no discharge Lungs-respiratory rate normal, CTA CV-no murmurs,RRR Extremities skin warm dry no edema Neuro grossly normal Behavior normal, alert       Assessment & Plan:  1. Fibromyalgia She has tried Tylenol she cannot take anti-inflammatories she is tried Cymbalta she has tried gabapentin tried Lyrica and none of these have helped she has severe pain and discomfort due to her arthralgias in her fibromyalgia will check some lab work - C-reactive protein - Sedimentation Rate - CK - Rheumatoid factor - ANA  2. Arthralgia, unspecified joint With the arthralgias she uses oxycodone sparingly her husband helps dispense this half a tablet when necessary we will prescribe 10 tablets she does not tolerate tramadol or hydrocodone please see discussion above regarding pain management of the fibromyalgia.  Patient states pain levels are severe and affects her quality of life she is aware never to use the oxycodone frequently and aware to only use it when at home  There really is not a good solution for her issue.  I am sympathetic to her pain but I do not believe were going to get under good control hopefully occasional use of oxycodone will help her in addition  to this she will try gentle exercises warm compresses as needed  Lab work to rule out polymyalgia rheumatica she has had previous lab work several years ago but given that she is having ongoing troubles we will repeat the lab work - C-reactive protein - Sedimentation Rate - CK - Rheumatoid factor - ANA  3. Female bladder prolapse She is requesting referral to urogynecology - Ambulatory referral to Urogynecology

## 2022-02-04 LAB — ANA: Anti Nuclear Antibody (ANA): NEGATIVE

## 2022-02-04 LAB — C-REACTIVE PROTEIN: CRP: 4 mg/L (ref 0–10)

## 2022-02-04 LAB — CK: Total CK: 36 U/L (ref 32–182)

## 2022-02-04 LAB — SEDIMENTATION RATE: Sed Rate: 9 mm/hr (ref 0–40)

## 2022-02-17 DIAGNOSIS — M7501 Adhesive capsulitis of right shoulder: Secondary | ICD-10-CM | POA: Diagnosis not present

## 2022-02-17 DIAGNOSIS — M25511 Pain in right shoulder: Secondary | ICD-10-CM | POA: Diagnosis not present

## 2022-02-19 ENCOUNTER — Other Ambulatory Visit: Payer: Self-pay | Admitting: Physician Assistant

## 2022-02-19 DIAGNOSIS — M7501 Adhesive capsulitis of right shoulder: Secondary | ICD-10-CM

## 2022-02-26 DIAGNOSIS — F332 Major depressive disorder, recurrent severe without psychotic features: Secondary | ICD-10-CM | POA: Diagnosis not present

## 2022-02-28 ENCOUNTER — Ambulatory Visit
Admission: RE | Admit: 2022-02-28 | Discharge: 2022-02-28 | Disposition: A | Discharge status: Home / Self Care | Payer: No Typology Code available for payment source | Source: Ambulatory Visit | Attending: Physician Assistant | Admitting: Physician Assistant

## 2022-02-28 DIAGNOSIS — M7501 Adhesive capsulitis of right shoulder: Secondary | ICD-10-CM

## 2022-02-28 DIAGNOSIS — M25511 Pain in right shoulder: Secondary | ICD-10-CM | POA: Diagnosis not present

## 2022-03-13 ENCOUNTER — Ambulatory Visit (INDEPENDENT_AMBULATORY_CARE_PROVIDER_SITE_OTHER): Payer: No Typology Code available for payment source | Admitting: Obstetrics and Gynecology

## 2022-03-13 ENCOUNTER — Encounter: Payer: Self-pay | Admitting: Obstetrics and Gynecology

## 2022-03-13 VITALS — BP 113/76 | HR 72 | Ht 62.75 in | Wt 203.0 lb

## 2022-03-13 DIAGNOSIS — R350 Frequency of micturition: Secondary | ICD-10-CM

## 2022-03-13 DIAGNOSIS — N3281 Overactive bladder: Secondary | ICD-10-CM | POA: Diagnosis not present

## 2022-03-13 DIAGNOSIS — N94819 Vulvodynia, unspecified: Secondary | ICD-10-CM

## 2022-03-13 DIAGNOSIS — R82998 Other abnormal findings in urine: Secondary | ICD-10-CM

## 2022-03-13 DIAGNOSIS — N952 Postmenopausal atrophic vaginitis: Secondary | ICD-10-CM

## 2022-03-13 DIAGNOSIS — K5904 Chronic idiopathic constipation: Secondary | ICD-10-CM

## 2022-03-13 LAB — POCT URINALYSIS DIPSTICK
Bilirubin, UA: NEGATIVE
Glucose, UA: NEGATIVE
Ketones, UA: NEGATIVE
Leukocytes, UA: NEGATIVE
Nitrite, UA: NEGATIVE
Protein, UA: NEGATIVE
Spec Grav, UA: 1.02 (ref 1.010–1.025)
Urobilinogen, UA: 0.2 E.U./dL
pH, UA: 7 (ref 5.0–8.0)

## 2022-03-13 MED ORDER — ESTRADIOL 0.1 MG/GM VA CREA: 0.5000 g | TOPICAL_CREAM | VAGINAL | 11 refills | Status: DC

## 2022-03-13 MED ORDER — LIDOCAINE-PRILOCAINE 2.5-2.5 % EX CREA: 1.0000 | TOPICAL_CREAM | CUTANEOUS | 2 refills | Status: AC | PRN

## 2022-03-13 NOTE — Patient Instructions (Addendum)
Today we talked about ways to manage bladder urgency such as altering your diet to avoid irritative beverages and foods (bladder diet) as well as attempting to decrease stress and other exacerbating factors.    The Most Bothersome Foods* The Least Bothersome Foods*  Coffee - Regular & Decaf Tea - caffeinated Carbonated beverages - cola, non-colas, diet & caffeine-free Alcohols - Beer, Red Wine, White Wine, Champagne Fruits - Grapefruit, [place], Orange, [employer] - Cranberry, Grapefruit, Orange, Pineapple Vegetables - Tomato & Tomato Products Flavor Enhancers - Hot peppers, Spicy foods, Chili, Horseradish, Vinegar, Monosodium glutamate (MSG) Artificial Sweeteners - NutraSweet, Sweet 'N Low, Equal (sweetener), Saccharin Ethnic foods - [place], [place], [place] food [employer] - low-fat & whole Fruits - Bananas, Blueberries, Honeydew melon, Pears, Raisins, Watermelon Vegetables - Broccoli, Brussels Sprouts, [place], Carrots, Cauliflower, [place], Cucumber, Mushrooms, Peas, Radishes, Squash, Zucchini, White potatoes, Sweet potatoes & yams Poultry - Chicken, Eggs, [place], [employer] - Beef, Programmer, multimedia, Lamb Seafood - Shrimp, [place] fish, Salmon Grains - Oat, Rice Snacks - Pretzels, Popcorn  *Lissa Morales et al. Diet and its role in interstitial cystitis/bladder pain syndrome (IC/BPS) and comorbid conditions. [place] 2012 Jan 11.   Constipation: Our goal is to achieve formed bowel movements daily or every-other-day.  You may need to try different combinations of the following options to find what works best for you - everybody's body works differently so feel free to adjust the dosages as needed.  Some options to help maintain bowel health include:  Dietary changes (more leafy greens, vegetables and fruits; less processed foods) Fiber supplementation (Benefiber, FiberCon, Metamucil or Psyllium). Start slow and increase gradually to full dose. Over-the-counter agents such as: stool  softeners (Docusate or Colace) and/or laxatives (Miralax, milk of magnesia)  "Power Pudding" is a natural mixture that may help your constipation.  To make blend 1 cup applesauce, 1 cup wheat bran, and 3/4 cup prune juice, refrigerate and then take 1 tablespoon daily with a large glass of water as needed.   We discussed the symptoms of overactive bladder (OAB), which include urinary urgency, urinary frequency, night-time urination, with or without urge incontinence.  We discussed management including behavioral therapy (decreasing bladder irritants by following a bladder diet, urge suppression strategies, timed voids, bladder retraining), physical therapy, medication; and for refractory cases posterior tibial nerve stimulation, sacral neuromodulation, and intravesical botulinum toxin injection.
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
## 2022-03-15 ENCOUNTER — Encounter: Payer: Self-pay | Admitting: Obstetrics and Gynecology

## 2022-03-15 LAB — URINE CULTURE

## 2022-03-16 ENCOUNTER — Ambulatory Visit: Payer: No Typology Code available for payment source | Admitting: Obstetrics and Gynecology

## 2022-03-16 NOTE — Progress Notes (Signed)
Office Visit Note  Patient: Ruth [name]             Date of Birth: [birth year]           MRN: [id number]             PCP: Kathyrn Drown, MD Referring: Kathyrn Drown, MD Visit Date: 03/26/2022 Occupation: '@GUAROCC'$ @  Subjective:  Right shoulder pain  History of Present Illness: Ruth [name] is a 82 y.o. female with history of osteoarthritis and fibromyalgia syndrome.  She states she continues to have pain and discomfort in her right shoulder.  She is unable to raise her right arm and has difficulty getting dressed and doing routine activities.  She recently had MRI of her right shoulder joint which showed severe osteoarthritis.  She was advised right total shoulder replacement by her orthopedic surgeon.  She continues to have pain and discomfort in her bilateral CMC joints.  She has stiffness in her both hands.  She has discomfort in her right knee joint.  Left knee joint has been replaced which has been doing well.  She has been having a flare of fibromyalgia with generalized pain and discomfort.    Activities of Daily Living:  Patient reports morning stiffness for 1 hour.   Patient Reports nocturnal pain.  Difficulty dressing/grooming: Reports Difficulty climbing stairs: Reports Difficulty getting out of chair: Reports Difficulty using hands for taps, buttons, cutlery, and/or writing: Reports  Review of Systems  Constitutional:  Positive for fatigue.  HENT:  Positive for mouth sores. Negative for mouth dryness.   Eyes:  Positive for dryness.  Respiratory:  Negative for shortness of breath.   Cardiovascular:  Negative for chest pain.  Gastrointestinal:  Positive for constipation and diarrhea. Negative for blood in stool.  Endocrine: Negative for increased urination.  Genitourinary:  Positive for involuntary urination.  Musculoskeletal:  Positive for joint pain, gait problem, joint pain, joint swelling, myalgias, morning stiffness and myalgias.  Skin:  Negative for color  change, rash, hair loss and sensitivity to sunlight.  Allergic/Immunologic: Negative for susceptible to infections.  Neurological:  Positive for headaches. Negative for dizziness.  Hematological:  Negative for swollen glands.  Psychiatric/Behavioral:  Positive for depressed mood and sleep disturbance. The patient is nervous/anxious.     PMFS History:  Patient Active Problem List   Diagnosis Date Noted   Pain in joint of right shoulder 07/09/2020   Body mass index (BMI) 35.0-35.9, adult 06/06/2019   Cervical spondylosis with radiculopathy 06/06/2019   MDD (major depressive disorder), recurrent, in partial remission ([place]) 03/27/2019   Depression, major, single episode, mild ([place]) 03/07/2018   History of total knee replacement, left 02/21/2018   OA (osteoarthritis) of knee 02/07/2018   Non-allergic rhinitis 12/16/2017   Allergy to metal 12/16/2017   Pain in right foot 09/21/2017   Primary osteoarthritis of both feet 04/29/2017   Osteoarthritis of foot joint 04/29/2017   Hammer toe 04/23/2017   Family history of premature coronary heart disease 04/15/2016   Primary osteoarthritis of both knees 12/31/2015   Vulvodynia 08/03/2014   Anxiety disorder 07/06/2014   Fibromyalgia 04/25/2014   Prediabetes 03/18/2014   Hyperlipidemia 03/18/2014   Osteopenia 03/06/2014   History of cardiac catheterization 04/28/2013   GERD (gastroesophageal reflux disease) 02/02/2012   Indigestion 02/02/2012   Esophageal dysphagia 02/05/2011   CHEST PAIN 02/11/2009   IRRITABLE BOWEL SYNDROME 02/15/2007   Primary osteoarthritis of both hands 02/15/2007   Degenerative joint disease of hand 02/15/2007  GASTRITIS 02/14/2007    Past Medical History:  Diagnosis Date   Anemia    Anxiety    Arthritis    Asthma    Depression    Eczema    Fatty liver    Fibromyalgia    GERD (gastroesophageal reflux disease)    EGD/ colon 1/09   H pylori ulcer 1980-1990   s/p treatment   History of kidney stones     Hyperlipidemia    IBS (irritable bowel syndrome)    Kidney cysts    Laryngospasm    Neck pain    OSA (obstructive sleep apnea)    Pneumonia 2013   PONV (postoperative nausea and vomiting)    Pre-diabetes    Refusal of blood transfusions as patient is Jehovah's Witness     Family History  Problem Relation Age of Onset   Paranoid behavior Mother        depression   Colon polyps Mother    Eczema Mother    Depression Mother    Diabetes Father    Heart attack Father        72's   Lung cancer Father    Cirrhosis Father 54       etoh cirrhosis   Eczema Father    Lung cancer Sister    Depression Sister    Alzheimer's disease Sister    Hypertension Sister    Diabetes Sister    Lung cancer Sister    Depression Maternal Aunt    COPD Daughter    Drug abuse Niece    Suicidality Niece    Allergic rhinitis Neg Hx    Asthma Neg Hx    Urticaria Neg Hx    Past Surgical History:  Procedure Laterality Date   BILATERAL SALPINGOOPHORECTOMY  1990s   abdominal   BIOPSY  09/11/2020   Procedure: BIOPSY;  Surgeon: Rogene Houston, MD;  [facility]: [facility];  Service: Endoscopy;;  gastric   CARDIAC CATHETERIZATION  2002   [facility]) normal coronary arteries    COLONOSCOPY  01/2007   Dr. Meriel Flavors   COLONOSCOPY N/A 04/18/2015   Procedure: COLONOSCOPY;  Surgeon: Rogene Houston, MD;  [facility]: [facility];  Service: Endoscopy;  Laterality: N/A;  830 - moved to 8:55 - Ann to notify pt   COLONOSCOPY WITH ESOPHAGOGASTRODUODENOSCOPY (EGD)  02/16/2012   Procedure: COLONOSCOPY WITH ESOPHAGOGASTRODUODENOSCOPY (EGD);  Surgeon: Daneil Dolin, MD;  [facility]: [facility];  Service: Endoscopy;  Laterality: N/A;  8:45   COLONOSCOPY WITH PROPOFOL N/A 09/11/2020   Procedure: COLONOSCOPY WITH PROPOFOL;  Surgeon: Rogene Houston, MD;  [facility]: [facility];  Service: Endoscopy;  Laterality: N/A;  10:55   ESOPHAGEAL DILATION N/A 06/07/2014   Procedure: ESOPHAGEAL DILATION;  Surgeon: Rogene Houston, MD;  [facility];  Service: Endoscopy;  Laterality: N/A;   ESOPHAGEAL DILATION N/A 09/11/2020   Procedure: ESOPHAGEAL DILATION;  Surgeon: Rogene Houston, MD;  [facility];  Service: Endoscopy;  Laterality: N/A;   ESOPHAGOGASTRODUODENOSCOPY  01/2007   Dr Sharlett Iles- 3 cm hiatal hernia, benign esophageal biopsies, erosive esophagitis, gastritis   ESOPHAGOGASTRODUODENOSCOPY N/A 06/07/2014   Procedure: ESOPHAGOGASTRODUODENOSCOPY (EGD);  Surgeon: Rogene Houston, MD;  [facility];  Service: Endoscopy;  Laterality: N/A;  1200   ESOPHAGOGASTRODUODENOSCOPY N/A 08/18/2017   Procedure: ESOPHAGOGASTRODUODENOSCOPY (EGD);  Surgeon: Rogene Houston, MD;  [facility];  Service: Endoscopy;  Laterality: N/A;  2:20   ESOPHAGOGASTRODUODENOSCOPY (EGD) WITH PROPOFOL N/A 09/11/2020  Procedure: ESOPHAGOGASTRODUODENOSCOPY (EGD) WITH PROPOFOL;  Surgeon: Rogene Houston, MD;  [facility];  Service: Endoscopy;  Laterality: N/A;   FOOT SURGERY Left 2005   hammer toe   INNER EAR SURGERY     Left   KNEE SURGERY Left    x 2   POLYPECTOMY  09/11/2020   Procedure: POLYPECTOMY;  Surgeon: Rogene Houston, MD;  [facility];  Service: Endoscopy;;   SHOULDER ARTHROSCOPY WITH ROTATOR CUFF REPAIR Right 05/07/2021   Procedure: SHOULDER ARTHROSCOPY WITH MANIPULATION UNDER ANESTHESIA ,SUBACROMIAL DECOMPRESSION, DISTAL CLAVICLE RESECTION, ROTATOR CUFF REPAIR , BICEPS TENOTOMY;  Surgeon: Sydnee Cabal, MD;  Location: [facility];  Service: Orthopedics;  Laterality: Right;  WITH INTERSCALENE BLOCK NEEDS PRE OP CONSULT  WITH ANESTHESIA 120   SHOULDER SURGERY Left 2011   TONSILLECTOMY     age 80   TOTAL KNEE ARTHROPLASTY Left 02/07/2018   Procedure: TOTAL KNEE ARTHROPLASTY;  Surgeon: Gaynelle Arabian, MD;  Location: [facility];  Service: Orthopedics;  Laterality: Left;  29mn   TUBAL LIGATION  1970   with an appy   [place]MPANOSTOMY [place]  Social History   Social History Narrative   Married 2 grown children, 1 adopted son-autistic-lives next door.   1 granddaughter   2 step grandsons   Immunization History  Administered Date(s) Administered   Influenza,inj,Quad PF,6+ Mos 12/15/2017, 11/15/2018   Influenza,inj,quad, With Preservative 11/16/2016   Influenza-Unspecified 11/22/2015, 11/01/2019, 11/30/2020, 11/26/2021   Moderna Sars-Covid-2 Vaccination 03/24/2019, 04/22/2019, 11/18/2019, 06/02/2020   Pneumococcal Conjugate-13 02/15/2014   Pneumococcal Polysaccharide-23 01/25/2020   Zoster Recombinat (Shingrix) 07/10/2016     Objective: Vital Signs: BP 112/73 (BP Location: Left Arm, Patient Position: Sitting, Cuff Size: Large)   Pulse 80   Resp 16   Ht '5\' 3"'$  (1.6 m)   Wt 205 lb (93 kg)   BMI 36.31 kg/m    Physical Exam Vitals and nursing note reviewed.  Constitutional:      Appearance: She is well-developed.  HENT:     Head: Normocephalic and atraumatic.  Eyes:     Conjunctiva/sclera: Conjunctivae normal.  Cardiovascular:     Rate and Rhythm: Normal rate and regular rhythm.     Heart sounds: Normal heart sounds.  Pulmonary:     Effort: Pulmonary effort is normal.     Breath sounds: Normal breath sounds.  Abdominal:     General: Bowel sounds are normal.     Palpations: Abdomen is soft.  Musculoskeletal:     Cervical back: Normal range of motion.  Lymphadenopathy:     Cervical: No cervical adenopathy.  Skin:    General: Skin is warm and dry.     Capillary Refill: Capillary refill takes less than 2 seconds.  Neurological:     Mental Status: She is alert and oriented to person, place, and time.  Psychiatric:        Behavior: Behavior normal.      Musculoskeletal Exam: She has some limitation with lateral rotation of the cervical spine.  Right shoulder joint abduction was limited to about 70 degrees and forward flexion 90 degrees.  She had limited internal rotation.  Left shoulder  joint was in good range of motion.  Elbow joints and wrist joints were in good range of motion.  She had bilateral CMC prominence, PIP and DIP thickening.  No synovitis was noted.  Hip joints were in good range of motion.  She had thickening and  limited extension of her right knee joint.  Left knee joint was replaced and was in good range of motion.  There was no tenderness over ankles or MTPs.  She had generalized hyperalgesia and positive tender points.  CDAI Exam: CDAI Score: -- Patient Global: --; Provider Global: -- Swollen: --; Tender: -- Joint Exam 03/26/2022   No joint exam has been documented for this visit   There is currently no information documented on the homunculus. Go to the Rheumatology activity and complete the homunculus joint exam.  Investigation: No additional findings.  Imaging: MR SHOULDER RIGHT WO CONTRAST  Result Date: 03/01/2022 CLINICAL DATA:  Right shoulder pain for 2 years. EXAM: MRI OF THE RIGHT SHOULDER WITHOUT CONTRAST TECHNIQUE: Multiplanar, multisequence MR imaging of the shoulder was performed. No intravenous contrast was administered. COMPARISON:  None Available. FINDINGS: Rotator cuff: Mild tendinosis of the supraspinatus tendon with a small partial-thickness articular surface tear. Mild tendinosis of the infraspinatus tendon. Teres minor tendon is intact. Mild tendinosis of the subscapularis tendon. Muscles: No muscle atrophy or edema. No intramuscular fluid collection or hematoma. Biceps Long Head: Prior tenodesis. Acromioclavicular Joint: Moderate arthropathy of the acromioclavicular joint. No subacromial/subdeltoid bursal fluid. Glenohumeral Joint: Moderate joint effusion with synovitis. Extensive full-thickness cartilage loss of the glenohumeral joint. Labrum: Severe attenuation of the superior and posterior labrum which may be related to prior debridement or degeneration. Bones: No fracture or dislocation. No aggressive osseous lesion. Other: No fluid  collection or hematoma. IMPRESSION: 1. Severe osteoarthritis of the right glenohumeral joint. 2. Mild tendinosis of the supraspinatus tendon with a small partial-thickness articular surface tear. 3. Mild tendinosis of the infraspinatus tendon. 4. Mild tendinosis of the subscapularis tendon. Electronically Signed   By: Kathreen Devoid M.D.   On: 03/01/2022 06:33    Recent Labs: Lab Results  Component Value Date   WBC 6.7 01/15/2022   HGB 13.6 01/15/2022   PLT 249 01/15/2022   NA 142 01/15/2022   K 4.0 01/15/2022   CL 100 01/15/2022   CO2 24 01/15/2022   GLUCOSE 87 01/15/2022   BUN 16 01/15/2022   CREATININE 0.70 01/15/2022   BILITOT 0.3 01/15/2022   ALKPHOS 67 01/15/2022   AST 17 01/15/2022   ALT 13 01/15/2022   PROT 6.7 01/15/2022   ALBUMIN 4.5 01/15/2022   CALCIUM 9.8 01/15/2022   GFRAA 84 07/07/2018    Speciality Comments: No specialty comments available.  Procedures:  No procedures performed Allergies: Blood-group specific substance, Ciprofloxacin, Codeine, Latex, Prozac [fluoxetine], Sertraline hcl, Vicodin [hydrocodone-acetaminophen], and Tramadol   Assessment / Plan:     Visit Diagnoses: Primary osteoarthritis of both hands-she had bilateral PIP and DIP thickening and CMC prominence.  No synovitis was noted.  Joint protection muscle strengthening was discussed.  A handout on hand exercises was given.  Summary osteoarthritis of right shoulder- She had right rotator cuff tear surgery in March 2023.  She continues to have pain and discomfort in her right shoulder joint.  She had limited range of motion.  MRI obtained on March 01, 2022 showed severe osteoarthritis.  Patient is planning to have total shoulder replacement in the future.  Primary osteoarthritis of right knee-according to the patient she has severe osteoarthritis in her right knee joint.  She will require total knee replacement in the future.  She has chronic discomfort.  Status post total knee replacement, left  -she had good range of motion of her left knee joint.  She had total knee replacement in December 2019 by  Dr. Maureen Ralphs.  Primary osteoarthritis of both feet-she had bilateral PIP and DIP thickening.  Proper fitting shoes were advised.  Pes cavus of both feet -orthotics were advised.  Hammertoes of both feet  Fibromyalgia-she is having a flare of fibromyalgia with generalized pain and discomfort.  Regular exercise and stretching was discussed.  Other fatigue-related to fibromyalgia and insomnia.  Primary insomnia-good sleep hygiene was discussed.  Osteopenia of multiple sites - advised her to have repeat DEXA with her PCP.  Coarse tremors  Orders: No orders of the defined types were placed in this encounter.  No orders of the defined types were placed in this encounter.   Follow-Up Instructions: No follow-ups on file.   Bo Merino, MD  Note - This record has been created using Editor, commissioning.  Chart creation errors have been sought, but may not always  have been located. Such creation errors do not reflect on  the standard of medical care.

## 2022-03-18 ENCOUNTER — Telehealth: Payer: Self-pay | Admitting: Family Medicine

## 2022-03-18 NOTE — Telephone Encounter (Signed)
Patient is requesting refill on oxycodone 5 mg but currently not on medication list but she states was prescribe this on 02/03/22 at her last visit.Isac Caddy

## 2022-03-18 NOTE — Telephone Encounter (Signed)
Please advise. Thank you

## 2022-03-19 ENCOUNTER — Other Ambulatory Visit: Payer: Self-pay | Admitting: Family Medicine

## 2022-03-19 MED ORDER — OXYCODONE HCL 5 MG PO TABS: ORAL_TABLET | ORAL | 0 refills | Status: DC

## 2022-03-19 NOTE — Telephone Encounter (Signed)
Refill was sent in for 10 tablets only at this time

## 2022-03-19 NOTE — Telephone Encounter (Signed)
Patient notified and verbalized understanding. 

## 2022-03-23 DIAGNOSIS — M19011 Primary osteoarthritis, right shoulder: Secondary | ICD-10-CM | POA: Diagnosis not present

## 2022-03-26 ENCOUNTER — Encounter: Payer: Self-pay | Admitting: Rheumatology

## 2022-03-26 ENCOUNTER — Ambulatory Visit: Payer: No Typology Code available for payment source | Attending: Rheumatology | Admitting: Rheumatology

## 2022-03-26 VITALS — BP 112/73 | HR 80 | Resp 16 | Ht 63.0 in | Wt 205.0 lb

## 2022-03-26 DIAGNOSIS — Q6672 Congenital pes cavus, left foot: Secondary | ICD-10-CM

## 2022-03-26 DIAGNOSIS — G8929 Other chronic pain: Secondary | ICD-10-CM

## 2022-03-26 DIAGNOSIS — M1711 Unilateral primary osteoarthritis, right knee: Secondary | ICD-10-CM

## 2022-03-26 DIAGNOSIS — M19071 Primary osteoarthritis, right ankle and foot: Secondary | ICD-10-CM | POA: Diagnosis not present

## 2022-03-26 DIAGNOSIS — M797 Fibromyalgia: Secondary | ICD-10-CM

## 2022-03-26 DIAGNOSIS — M2041 Other hammer toe(s) (acquired), right foot: Secondary | ICD-10-CM

## 2022-03-26 DIAGNOSIS — M19041 Primary osteoarthritis, right hand: Secondary | ICD-10-CM | POA: Diagnosis not present

## 2022-03-26 DIAGNOSIS — F5101 Primary insomnia: Secondary | ICD-10-CM

## 2022-03-26 DIAGNOSIS — M19072 Primary osteoarthritis, left ankle and foot: Secondary | ICD-10-CM

## 2022-03-26 DIAGNOSIS — M19011 Primary osteoarthritis, right shoulder: Secondary | ICD-10-CM

## 2022-03-26 DIAGNOSIS — G252 Other specified forms of tremor: Secondary | ICD-10-CM

## 2022-03-26 DIAGNOSIS — R5383 Other fatigue: Secondary | ICD-10-CM

## 2022-03-26 DIAGNOSIS — Z96652 Presence of left artificial knee joint: Secondary | ICD-10-CM | POA: Diagnosis not present

## 2022-03-26 DIAGNOSIS — Q6671 Congenital pes cavus, right foot: Secondary | ICD-10-CM | POA: Diagnosis not present

## 2022-03-26 DIAGNOSIS — M8589 Other specified disorders of bone density and structure, multiple sites: Secondary | ICD-10-CM

## 2022-03-26 NOTE — Patient Instructions (Signed)
Hand Exercises Hand exercises can be helpful for almost anyone. These exercises can strengthen the hands, improve flexibility and movement, and increase blood flow to the hands. These results can make work and daily tasks easier. Hand exercises can be especially helpful for people who have joint pain from arthritis or have nerve damage from overuse (carpal tunnel syndrome). These exercises can also help people who have injured a hand. Exercises Most of these hand exercises are gentle stretching and motion exercises. It is usually safe to do them often throughout the day. Warming up your hands before exercise may help to reduce stiffness. You can do this with gentle massage or by placing your hands in warm water for 10-15 minutes. It is normal to feel some stretching, pulling, tightness, or mild discomfort as you begin new exercises. This will gradually improve. Stop an exercise right away if you feel sudden, severe pain or your pain gets worse. Ask your health care provider which exercises are best for you. Knuckle bend or "claw" fist  Stand or sit with your arm, hand, and all five fingers pointed straight up. Make sure to keep your wrist straight during the exercise. Gently bend your fingers down toward your palm until the tips of your fingers are touching the top of your palm. Keep your big knuckle straight and just bend the small knuckles in your fingers. Hold this position for __________ seconds. Straighten (extend) your fingers back to the starting position. Repeat this exercise 5-10 times with each hand. Full finger fist  Stand or sit with your arm, hand, and all five fingers pointed straight up. Make sure to keep your wrist straight during the exercise. Gently bend your fingers into your palm until the tips of your fingers are touching the middle of your palm. Hold this position for __________ seconds. Extend your fingers back to the starting position, stretching every joint fully. Repeat  this exercise 5-10 times with each hand. Straight fist Stand or sit with your arm, hand, and all five fingers pointed straight up. Make sure to keep your wrist straight during the exercise. Gently bend your fingers at the big knuckle, where your fingers meet your hand, and the middle knuckle. Keep the knuckle at the tips of your fingers straight and try to touch the bottom of your palm. Hold this position for __________ seconds. Extend your fingers back to the starting position, stretching every joint fully. Repeat this exercise 5-10 times with each hand. Tabletop  Stand or sit with your arm, hand, and all five fingers pointed straight up. Make sure to keep your wrist straight during the exercise. Gently bend your fingers at the big knuckle, where your fingers meet your hand, as far down as you can while keeping the small knuckles in your fingers straight. Think of forming a tabletop with your fingers. Hold this position for __________ seconds. Extend your fingers back to the starting position, stretching every joint fully. Repeat this exercise 5-10 times with each hand. Finger spread  Place your hand flat on a table with your palm facing down. Make sure your wrist stays straight as you do this exercise. Spread your fingers and thumb apart from each other as far as you can until you feel a gentle stretch. Hold this position for __________ seconds. Bring your fingers and thumb tight together again. Hold this position for __________ seconds. Repeat this exercise 5-10 times with each hand. Making circles  Stand or sit with your arm, hand, and all five fingers pointed   straight up. Make sure to keep your wrist straight during the exercise. Make a circle by touching the tip of your thumb to the tip of your index finger. Hold for __________ seconds. Then open your hand wide. Repeat this motion with your thumb and each finger on your hand. Repeat this exercise 5-10 times with each hand. Thumb  motion  Sit with your forearm resting on a table and your wrist straight. Your thumb should be facing up toward the ceiling. Keep your fingers relaxed as you move your thumb. Lift your thumb up as high as you can toward the ceiling. Hold for __________ seconds. Bend your thumb across your palm as far as you can, reaching the tip of your thumb for the small finger (pinkie) side of your palm. Hold for __________ seconds. Repeat this exercise 5-10 times with each hand. Grip strengthening  Hold a stress ball or other soft ball in the middle of your hand. Slowly increase the pressure, squeezing the ball as much as you can without causing pain. Think of bringing the tips of your fingers into the middle of your palm. All of your finger joints should bend when doing this exercise. Hold your squeeze for __________ seconds, then relax. Repeat this exercise 5-10 times with each hand. Contact a health care provider if: Your hand pain or discomfort gets much worse when you do an exercise. Your hand pain or discomfort does not improve within 2 hours after you exercise. If you have any of these problems, stop doing these exercises right away. Do not do them again unless your health care provider says that you can. Get help right away if: You develop sudden, severe hand pain or swelling. If this happens, stop doing these exercises right away. Do not do them again unless your health care provider says that you can. This information is not intended to replace advice given to you by your health care provider. Make sure you discuss any questions you have with your health care provider. Document Revised: 05/23/2020 Document Reviewed: 05/23/2020 [employer] Patient Education  2023 [employer] Exercises for Chronic Knee Pain Chronic knee pain is pain that lasts longer than 3 months. For most people with chronic knee pain, exercise and weight loss is an important part of treatment. Your health care provider may want  you to focus on: Strengthening the muscles that support your knee. This can take pressure off your knee and lessen pain. Preventing knee stiffness. Maintaining or increasing how far you can move your knee. Losing weight (if this applies) to take pressure off your knee, decrease your risk for injury, and make it easier for you to exercise. Your health care provider will help you develop an exercise program that matches your needs and physical abilities. Below are simple, low-impact exercises you can do at home. Ask your health care provider or a physical therapist how often you should do your exercise program and how many times to repeat each exercise. General safety tips Follow these safety tips for exercising with chronic knee pain: Get your health care provider's approval before doing any exercises. Start slowly and stop any time an exercise causes pain. Do not exercise if your knee pain is flaring up. Warm up first. Stretching a cold muscle can cause an injury. Do 5-10 minutes of easy movement or light stretching before beginning your exercise routine. Do 5-10 minutes of low-impact activity (like walking or cycling) before starting strengthening exercises. Contact your health care provider any time you have pain   during or after exercising. Exercise may cause discomfort but should not be painful. It is normal to be a little stiff or sore after exercising.  Stretching and range-of-motion exercises Front thigh stretch  Stand up straight and support your body by holding on to a chair or resting one hand on a wall. With your legs straight and close together, bend one knee to lift your heel up toward your buttocks. Using one hand for support, grab your ankle with your free hand. Pull your foot up closer toward your buttocks to feel the stretch in front of your thigh. Hold the stretch for 30 seconds. Repeat __________ times. Complete this exercise __________ times a day. Back thigh stretch  Sit  on the floor with your back straight and your legs out straight in front of you. Place the palms of your hands on the floor and slide them toward your feet as you bend at the hip. Try to touch your nose to your knees and feel the stretch in the back of your thighs. Hold for 30 seconds. Repeat __________ times. Complete this exercise __________ times a day. Calf stretch  Stand facing a wall. Place the palms of your hands flat against the wall, arms extended, and lean slightly against the wall. Get into a lunge position with one leg bent at the knee and the other leg stretched out straight behind you. Keep both feet facing the wall and increase the bend in your knee while keeping the heel of the other leg flat on the ground. You should feel the stretch in your calf. Hold for 30 seconds. Repeat __________ times. Complete this exercise __________ times a day. Strengthening exercises Straight leg lift Lie on your back with one knee bent and the other leg out straight. Slowly lift the straight leg without bending the knee. Lift until your foot is about 12 inches (30 cm) off the floor. Hold for 3-5 seconds and slowly lower your leg. Repeat __________ times. Complete this exercise __________ times a day. Single leg dip Stand between two chairs and put both hands on the backs of the chairs for support. Extend one leg out straight with your body weight resting on the heel of the standing leg. Slowly bend your standing knee to dip your body to the level that is comfortable for you. Hold for 3-5 seconds. Repeat __________ times. Complete this exercise __________ times a day. Hamstring curls Stand straight, knees close together, facing the back of a chair. Hold on to the back of a chair with both hands. Keep one leg straight. Bend the other knee while bringing the heel up toward the buttock until the knee is bent at a 90-degree angle (right angle). Hold for 3-5 seconds. Repeat __________ times.  Complete this exercise __________ times a day. Wall squat Stand straight with your back, hips, and head against a wall. Step forward one foot at a time with your back still against the wall. Your feet should be 2 feet (61 cm) from the wall at shoulder width. Keeping your back, hips, and head against the wall, slide down the wall to as close of a sitting position as you can get. Hold for 5-10 seconds, then slowly slide back up. Repeat __________ times. Complete this exercise __________ times a day. Step-ups Step up with one foot onto a sturdy platform or stool that is about 6 inches (15 cm) high. Face sideways with one foot on the platform and one on the ground. Place all your weight on  on the platform foot and lift your body off the ground until your knee extends. Let your other leg hang free to the side. Hold for 3-5 seconds then slowly lower your weight down to the floor foot. Repeat __________ times. Complete this exercise __________ times a day. Contact a health care provider if: Your exercise causes pain. Your pain is worse after you exercise. Your pain prevents you from doing your exercises. This information is not intended to replace advice given to you by your health care provider. Make sure you discuss any questions you have with your health care provider. Document Revised: 06/08/2019 Document Reviewed: 01/30/2019 [employer] Patient Education  2023 [employer]  

## 2022-03-27 ENCOUNTER — Ambulatory Visit: Payer: No Typology Code available for payment source | Admitting: Obstetrics and Gynecology

## 2022-04-02 NOTE — Progress Notes (Signed)
Subjective:   Ruth [name] is a 82 y.o. female who presents for Medicare Annual (Subsequent) preventive examination.  I connected with  [name] on 04/03/22 by a audio enabled telemedicine application and verified that I am speaking with the correct person using two identifiers.  Patient Location: Home  Provider Location: Office/Clinic  I discussed the limitations of evaluation and management by telemedicine. The patient expressed understanding and agreed to proceed.  Review of Systems     Cardiac Risk Factors include: advanced age (>41mn, >>59women);dyslipidemia;sedentary lifestyle     Objective:    Today's Vitals   04/03/22 1058  Weight: 208 lb (94.3 kg)  Height: 5' 5"$  (1.651 m)  PainSc: 4    Body mass index is 34.61 kg/m.     04/03/2022   11:01 AM 04/25/2021   11:48 AM 03/04/2021   11:11 AM 02/25/2021    1:25 PM 09/09/2020   12:15 PM 08/09/2019    8:24 AM 04/14/2018    1:00 PM  Advanced Directives  Does Patient Have a Medical Advance Directive? Yes Yes Yes Yes Yes Yes Yes  Type of AParamedicof AIlwacoLiving will HRavenelLiving will HVivianLiving will HIndianolaLiving will HLong [place]Living will [place]Sc[place] Does patient want to make changes to medical advance directive? No - Patient declined    No - Patient declined No - Patient declined   Copy of HMount [place]in Chart? No - copy requested  No - copy requested No - copy requested No - copy requested No - copy requested Yes - validated most recent copy scanned in chart (See row information)    Current Medications (verified) Outpatient Encounter Medications as of 04/03/2022  Medication Sig   acetaminophen (TYLENOL) 500 MG tablet Take 1,000 mg by mouth every 6 (six) hours as needed for moderate pain.   acidophilus (RISAQUAD) CAPS capsule Take 1  capsule by mouth 4 (four) times a week.   albuterol (VENTOLIN HFA) 108 (90 Base) MCG/ACT inhaler Inhale 2 puffs into the lungs every 6 (six) hours as needed for wheezing.   Ascorbic Acid (VITAMIN C PO) Take 1 tablet by mouth 4 (four) times a week.   b complex vitamins capsule Take 1 capsule by mouth 4 (four) times a week.   dicyclomine (BENTYL) 10 MG capsule Take 1 capsule (10 mg total) by mouth 3 (three) times daily as needed. For stomach cramps   estradiol (ESTRACE) 0.1 MG/GM vaginal cream Place 0.5 g vaginally 2 (two) times a week. Place 0.5g nightly for two weeks then twice a week after   famotidine (PEPCID) 40 MG tablet Take 1 tablet (40 mg total) by mouth daily as needed.   FLUoxetine (PROZAC) 10 MG capsule Take 1 capsule (10 mg total) by mouth daily.   fluticasone (CUTIVATE) 0.05 % cream Apply 1 application topically 2 (two) times daily.   ketoconazole (NIZORAL) 2 % cream Apply 1 application topically daily. (Patient taking differently: Apply 1 application  topically daily as needed for irritation.)   lidocaine-prilocaine (EMLA) cream Apply 1 Application topically as needed.   MAGNESIUM PO Take 1 tablet by mouth 4 (four) times a week.   Multiple Vitamins-Minerals (ZINC PO) Take 1 tablet by mouth daily.   nitroGLYCERIN (NITROSTAT) 0.4 MG SL tablet as needed.   ondansetron (ZOFRAN) 4 MG tablet Take 1 tablet (4 mg total) by mouth every 8 (eight)  hours as needed for nausea or vomiting.   oxyCODONE (ROXICODONE) 5 MG immediate release tablet 1/2 to 1 q 6 hours prn pain   Polyvinyl Alcohol (LUBRICANT DROPS OP) Place 2 drops into both eyes 3 (three) times daily as needed (for dry eyes).   triamcinolone cream (KENALOG) 0.1 % as needed.   No facility-administered encounter medications on file as of 04/03/2022.    Allergies (verified) Blood-group specific substance, Ciprofloxacin, Codeine, Latex, Prozac [fluoxetine], Sertraline hcl, Vicodin [hydrocodone-acetaminophen], and Tramadol    History: Past Medical History:  Diagnosis Date   Anemia    Anxiety    Arthritis    Asthma    Depression    Eczema    Fatty liver    Fibromyalgia    GERD (gastroesophageal reflux disease)    EGD/ colon 1/09   H pylori ulcer 1980-1990   s/p treatment   History of kidney stones    Hyperlipidemia    IBS (irritable bowel syndrome)    Kidney cysts    Laryngospasm    Neck pain    OSA (obstructive sleep apnea)    Pneumonia 2013   PONV (postoperative nausea and vomiting)    Pre-diabetes    Refusal of blood transfusions as patient is Jehovah's Witness    Past Surgical History:  Procedure Laterality Date   BILATERAL SALPINGOOPHORECTOMY  1990s   abdominal   BIOPSY  09/11/2020   Procedure: BIOPSY;  Surgeon: Rogene Houston, MD;  [facility];  Service: Endoscopy;;  gastric   CARDIAC CATHETERIZATION  2002   [facility]) normal coronary arteries    COLONOSCOPY  01/2007   Dr. Meriel Flavors   COLONOSCOPY N/A 04/18/2015   Procedure: COLONOSCOPY;  Surgeon: Rogene Houston, MD;  [facility];  Service: Endoscopy;  Laterality: N/A;  830 - moved to 8:55 - Ann to notify pt   COLONOSCOPY WITH ESOPHAGOGASTRODUODENOSCOPY (EGD)  02/16/2012   Procedure: COLONOSCOPY WITH ESOPHAGOGASTRODUODENOSCOPY (EGD);  Surgeon: Daneil Dolin, MD;  [facility];  Service: Endoscopy;  Laterality: N/A;  8:45   COLONOSCOPY WITH PROPOFOL N/A 09/11/2020   Procedure: COLONOSCOPY WITH PROPOFOL;  Surgeon: Rogene Houston, MD;  [facility];  Service: Endoscopy;  Laterality: N/A;  10:55   ESOPHAGEAL DILATION N/A 06/07/2014   Procedure: ESOPHAGEAL DILATION;  Surgeon: Rogene Houston, MD;  [facility];  Service: Endoscopy;  Laterality: N/A;   ESOPHAGEAL DILATION N/A 09/11/2020   Procedure: ESOPHAGEAL DILATION;  Surgeon: Rogene Houston, MD;  [facility];  Service: Endoscopy;  Laterality: N/A;   ESOPHAGOGASTRODUODENOSCOPY  01/2007   Dr Sharlett Iles- 3 cm  hiatal hernia, benign esophageal biopsies, erosive esophagitis, gastritis   ESOPHAGOGASTRODUODENOSCOPY N/A 06/07/2014   Procedure: ESOPHAGOGASTRODUODENOSCOPY (EGD);  Surgeon: Rogene Houston, MD;  [facility];  Service: Endoscopy;  Laterality: N/A;  1200   ESOPHAGOGASTRODUODENOSCOPY N/A 08/18/2017   Procedure: ESOPHAGOGASTRODUODENOSCOPY (EGD);  Surgeon: Rogene Houston, MD;  [facility];  Service: Endoscopy;  Laterality: N/A;  2:20   ESOPHAGOGASTRODUODENOSCOPY (EGD) WITH PROPOFOL N/A 09/11/2020   Procedure: ESOPHAGOGASTRODUODENOSCOPY (EGD) WITH PROPOFOL;  Surgeon: Rogene Houston, MD;  [facility];  Service: Endoscopy;  Laterality: N/A;   FOOT SURGERY Left 2005   hammer toe   INNER EAR SURGERY     Left   KNEE SURGERY Left    x 2   POLYPECTOMY  09/11/2020   Procedure: POLYPECTOMY;  Surgeon: Rogene Houston, MD;  [facility];  Service: Endoscopy;;   SHOULDER ARTHROSCOPY WITH ROTATOR CUFF REPAIR Right 05/07/2021   Procedure: SHOULDER ARTHROSCOPY WITH MANIPULATION UNDER ANESTHESIA ,SUBACROMIAL DECOMPRESSION, DISTAL CLAVICLE RESECTION, ROTATOR CUFF REPAIR , BICEPS TENOTOMY;  Surgeon: Sydnee Cabal, MD;  Location: [facility];  Service: Orthopedics;  Laterality: Right;  WITH INTERSCALENE BLOCK NEEDS PRE OP CONSULT  WITH ANESTHESIA 120   SHOULDER SURGERY Left 2011   TONSILLECTOMY     age 59   TOTAL KNEE ARTHROPLASTY Left 02/07/2018   Procedure: TOTAL KNEE ARTHROPLASTY;  Surgeon: Gaynelle Arabian, MD;  Location: [facility];  Service: Orthopedics;  Laterality: Left;  54mn   TUBAL LIGATION  1970   with an appy   [place]  Family History  Problem Relation Age of Onset   Paranoid behavior Mother        depression   Colon polyps Mother    Eczema Mother    Depression Mother    Diabetes Father    Heart attack Father        556's  Lung cancer Father    Cirrhosis Father 568      etoh cirrhosis    Eczema Father    Lung cancer Sister    Depression Sister    Alzheimer's disease Sister    Hypertension Sister    Diabetes Sister    Lung cancer Sister    Depression Maternal Aunt    COPD Daughter    Drug abuse Niece    Suicidality Niece    Allergic rhinitis Neg Hx    Asthma Neg Hx    Urticaria Neg Hx    Social History   Socioeconomic History   Marital status: Married    Spouse name: RHerbie [place]  Number of children: 2   Years of education: Not on file   Highest education level: Not on file  Occupational History   Occupation: Retired; OMedia planner Tobacco Use   Smoking status: Never    Passive exposure: Past   Smokeless tobacco: Never  Vaping Use   Vaping Use: Never used  Substance and Sexual Activity   Alcohol use: Yes    Alcohol/week: 0.0 standard drinks of alcohol    Comment: per patient very seldom   Drug use: No   Sexual activity: Not Currently    Partners: Male    Comment: Hysterectomy  Other Topics Concern   Not on file  Social History Narrative   Married 2 grown children, 1 adopted son-autistic-lives next door.   1 granddaughter   2 step grandsons   Social Determinants of Health   Financial Resource Strain: Low Risk  (04/03/2022)   Overall Financial Resource Strain (CARDIA)    Difficulty of Paying Living Expenses: Not hard at all  Food Insecurity: No Food Insecurity (04/03/2022)   Hunger Vital Sign    Worried About Running Out of Food in the Last Year: Never true    Ran Out of Food in the Last Year: Never true  Transportation Needs: No Transportation Needs (04/03/2022)   PRAPARE - THydrologist(Medical): No    Lack of Transportation (Non-Medical): No  Physical Activity: Insufficiently Active (04/03/2022)   Exercise Vital Sign    Days of Exercise per Week: 3 days    Minutes of Exercise per Session: 30 min  Stress: No Stress Concern Present (04/03/2022)   [place]  Feeling of Stress : Only a little  Social Connections: Socially Integrated (04/03/2022)   Social Connection and Isolation Panel [NHANES]    Frequency of Communication with Friends and Family: More than three times a week    Frequency of Social Gatherings with Friends and Family: More than three times a week    Attends Religious Services: More than 4 times per year    Active Member of [employer] or Organizations: Yes    Attends Music therapist: More than 4 times per year    Marital Status: Married    Tobacco Counseling Counseling given: Not Answered   Clinical Intake:  Pre-visit preparation completed: Yes  Pain : 0-10 Pain Score: 4  Pain Type: Chronic pain Pain Location: Shoulder Pain Orientation: Right  Diabetes: No  How often do you need to have someone help you when you read instructions, pamphlets, or other written materials from your doctor or pharmacy?: 1 - Never  Diabetic?No   Interpreter Needed?: No  Information entered by :: Denman George LPN   Activities of Daily Living    04/03/2022   11:01 AM 04/25/2021   11:49 AM  In your present state of health, do you have any difficulty performing the following activities:  Hearing? 0   Vision? 0   Difficulty concentrating or making decisions? 0   Walking or climbing stairs? 0   Dressing or bathing? 1   Doing errands, shopping? 1 0  Preparing Food and eating ? N   Using the Toilet? N   In the past six months, have you accidently leaked urine? N   Do you have problems with loss of bowel control? N   Managing your Medications? N   Managing your Finances? N   Housekeeping or managing your Housekeeping? Y     Patient Care Team: Kathyrn Drown, MD as PCP - General (Family Medicine) Josue Hector, MD as PCP - Cardiology (Cardiology) Gala Romney Cristopher Estimable, MD (Gastroenterology) [facility] Bo Merino, MD as Consulting Physician (Rheumatology) Jaquita Folds,  MD as Consulting Physician (Obstetrics and Gynecology) Justice Britain, MD as Consulting Physician (Orthopedic Surgery)  Indicate any recent Medical Services you may have received from other than [facility] providers in the past year (date may be approximate).     Assessment:   This is a routine wellness examination for [place].  Hearing/Vision screen Hearing Screening - Comments:: Denies hearing difficulties  Vision Screening - Comments:: Wears rx glasses - up to date with routine eye exams with [facility]     Dietary issues and exercise activities discussed: Current Exercise Habits: The patient does not participate in regular exercise at present, Exercise limited by: orthopedic condition(s)   Goals Addressed   None    Depression Screen    04/03/2022   11:08 AM 02/03/2022    9:53 AM 01/13/2022   11:48 AM 02/25/2021    1:14 PM 10/29/2020   10:11 AM 05/12/2020    9:37 AM 02/21/2020   10:32 AM  PHQ 2/9 Scores  PHQ - 2 Score 3 3 0 0 3 4 3  $ PHQ- 9 Score 10 14 9  16 15 19    $ Fall Risk    04/03/2022   11:00 AM 01/13/2022   11:48 AM 02/25/2021    1:26 PM 05/10/2020    1:41 PM 01/25/2020   10:50 AM  Fall Risk   Falls in the past year? 0 0 1 1 0  Number falls in past yr: 0  0 0 0 0  Injury with Fall? 0 0 0 0 0  Risk for fall due to :  No Fall Risks Impaired balance/gait;Impaired mobility History of fall(s)   Follow up Falls prevention discussed;Education provided;Falls evaluation completed Falls evaluation completed Falls prevention discussed Falls evaluation completed;Education provided Falls evaluation completed    FALL RISK PREVENTION PERTAINING TO THE HOME:  Any stairs in or around the home? No  If so, are there any without handrails? No  Home free of loose throw rugs in walkways, pet beds, electrical cords, etc? Yes  Adequate lighting in your home to reduce risk of falls? Yes   ASSISTIVE DEVICES UTILIZED TO PREVENT FALLS:  Life alert? No  Use of a cane, walker or w/c? No   Grab bars in the bathroom? Yes  Shower chair or bench in shower? No  Elevated toilet seat or a handicapped toilet? Yes   TIMED UP AND GO:  Was the test performed? No . Telephonic visit   Cognitive Function:        04/03/2022   11:01 AM 02/25/2021    1:35 PM  6CIT Screen  What Year? 0 points 0 points  What month? 0 points 0 points  What time? 0 points 0 points  Count back from 20 0 points 0 points  Months in reverse 0 points 0 points  Repeat phrase 0 points 0 points  Total Score 0 points 0 points    Immunizations Immunization History  Administered Date(s) Administered   Influenza,inj,Quad PF,6+ Mos 12/15/2017, 11/15/2018   Influenza,inj,quad, With Preservative 11/16/2016   Influenza-Unspecified 11/22/2015, 11/01/2019, 11/30/2020, 11/26/2021   Moderna Sars-Covid-2 Vaccination 03/24/2019, 04/22/2019, 11/18/2019, 06/02/2020   Pneumococcal Conjugate-13 02/15/2014   Pneumococcal Polysaccharide-23 01/25/2020   Zoster Recombinat (Shingrix) 07/10/2016    TDAP status: Due, Education has been provided regarding the importance of this vaccine. Advised may receive this vaccine at local pharmacy or Health Dept. Aware to provide a copy of the vaccination record if obtained from local pharmacy or Health Dept. Verbalized acceptance and understanding.  Flu Vaccine status: Up to date  Pneumococcal vaccine status: Up to date  Covid-19 vaccine status: Information provided on how to obtain vaccines.   Qualifies for Shingles Vaccine? Yes   Zostavax completed No   Shingrix Completed?: No.    Education has been provided regarding the importance of this vaccine. Patient has been advised to call insurance company to determine out of pocket expense if they have not yet received this vaccine. Advised may also receive vaccine at local pharmacy or Health Dept. Verbalized acceptance and understanding.  Screening Tests Health Maintenance  Topic Date Due   DTaP/Tdap/Td (1 - Tdap) Never done    COVID-19 Vaccine (5 - 2023-24 season) 10/17/2021   Zoster Vaccines- Shingrix (2 of 2) 04/15/2022 (Originally 09/04/2016)   Medicare Annual Wellness (AWV)  04/04/2023   Pneumonia Vaccine 66+ Years old  Completed   INFLUENZA VACCINE  Completed   DEXA SCAN  Completed   HPV VACCINES  Aged Out   COLONOSCOPY (Pts 45-3yr Insurance coverage will need to be confirmed)  Discontinued    Health Maintenance  Health Maintenance Due  Topic Date Due   DTaP/Tdap/Td (1 - Tdap) Never done   COVID-19 Vaccine (5 - 2023-24 season) 10/17/2021    Colorectal cancer screening: No longer required.   Mammogram status: No longer required due to age.  Bone Density status: Completed 09/01/16. Results reflect: Bone density results: OSTEOPENIA. Repeat every 2 years.  Lung Cancer Screening: (Low Dose  CT Chest recommended if Age 31-80 years, 30 pack-year currently smoking OR have quit w/in 15years.) does not qualify.   Lung Cancer Screening Referral: n/a  Additional Screening:  Hepatitis C Screening: does not qualify  Vision Screening: Recommended annual ophthalmology exams for early detection of glaucoma and other disorders of the eye. Is the patient up to date with their annual eye exam?  Yes  Who is the provider or what is the name of the office in which the patient attends annual eye exams? Dr. Katy Fitch  If pt is not established with a provider, would they like to be referred to a provider to establish care? No .   Dental Screening: Recommended annual dental exams for proper oral hygiene  Community Resource Referral / Chronic Care Management: CRR required this visit?  No   CCM required this visit?  No      Plan:     I have personally reviewed and noted the following in the patient's chart:   Medical and social history Use of alcohol, tobacco or illicit drugs  Current medications and supplements including opioid prescriptions. Patient is not currently taking opioid prescriptions. Functional  ability and status Nutritional status Physical activity Advanced directives List of other physicians Hospitalizations, surgeries, and ER visits in previous 12 months Vitals Screenings to include cognitive, depression, and falls Referrals and appointments  In addition, I have reviewed and discussed with patient certain preventive protocols, quality metrics, and best practice recommendations. A written personalized care plan for preventive services as well as general preventive health recommendations were provided to patient.     Vanetta Mulders, [place]   [id number]   Due to this being a virtual visit, the after visit summary with patients personalized plan was offered to patient via mail or my-chart. Patient would like to access on my-chart  Nurse Notes: No concerns

## 2022-04-02 NOTE — Patient Instructions (Addendum)
Ruth [name] , Thank you for taking time to come for your Medicare Wellness Visit. I appreciate your ongoing commitment to your health goals. Please review the following plan we discussed and let me know if I can assist you in the future.   These are the goals we discussed:  Goals      Have 3 meals a day     Pt would like to eat healthier.         This is a list of the screening recommended for you and due dates:  Health Maintenance  Topic Date Due   DTaP/Tdap/Td vaccine (1 - Tdap) Never done   COVID-19 Vaccine (5 - 2023-24 season) 10/17/2021   Zoster (Shingles) Vaccine (2 of 2) 04/15/2022*   Medicare Annual Wellness Visit  04/04/2023   Pneumonia Vaccine  Completed   Flu Shot  Completed   DEXA scan (bone density measurement)  Completed   HPV Vaccine  Aged Out   Colon Cancer Screening  Discontinued  *Topic was postponed. The date shown is not the original due date.    Advanced directives: Please bring a copy of your health care power of attorney and living will to the office to be added to your chart at your convenience.   Conditions/risks identified: Aim for 30 minutes of exercise or brisk walking, 6-8 glasses of water, and 5 servings of fruits and vegetables each day.   Next appointment: Follow up in one year for your annual wellness visit   The number to schedule your bone density scan at [facility] is [phone]   Preventive Care 65 Years and Older, Female Preventive care refers to lifestyle choices and visits with your health care provider that can promote health and wellness. What does preventive care include? A yearly physical exam. This is also called an annual well check. Dental exams once or twice a year. Routine eye exams. Ask your health care provider how often you should have your eyes checked. Personal lifestyle choices, including: Daily care of your teeth and gums. Regular physical activity. Eating a healthy diet. Avoiding tobacco and drug use. Limiting  alcohol use. Practicing safe sex. Taking low-dose aspirin every day. Taking vitamin and mineral supplements as recommended by your health care provider. What happens during an annual well check? The services and screenings done by your health care provider during your annual well check will depend on your age, overall health, lifestyle risk factors, and family history of disease. Counseling  Your health care provider may ask you questions about your: Alcohol use. Tobacco use. Drug use. Emotional well-being. Home and relationship well-being. Sexual activity. Eating habits. History of falls. Memory and ability to understand (cognition). Work and work Statistician. Reproductive health. Screening  You may have the following tests or measurements: Height, weight, and BMI. Blood pressure. Lipid and cholesterol levels. These may be checked every 5 years, or more frequently if you are over 1 years old. Skin check. Lung cancer screening. You may have this screening every year starting at age 17 if you have a 30-pack-year history of smoking and currently smoke or have quit within the past 15 years. Fecal occult blood test (FOBT) of the stool. You may have this test every year starting at age 4. Flexible sigmoidoscopy or colonoscopy. You may have a sigmoidoscopy every 5 years or a colonoscopy every 10 years starting at age 24. Hepatitis C blood test. Hepatitis B blood test. Sexually transmitted disease (STD) testing. Diabetes screening. This is done by checking your  blood sugar (glucose) after you have not eaten for a while (fasting). You may have this done every 1-3 years. Bone density scan. This is done to screen for osteoporosis. You may have this done starting at age 62. Mammogram. This may be done every 1-2 years. Talk to your health care provider about how often you should have regular mammograms. Talk with your health care provider about your test results, treatment options, and if  necessary, the need for more tests. Vaccines  Your health care provider may recommend certain vaccines, such as: Influenza vaccine. This is recommended every year. Tetanus, diphtheria, and acellular pertussis (Tdap, Td) vaccine. You may need a Td booster every 10 years. Zoster vaccine. You may need this after age 12. Pneumococcal 13-valent conjugate (PCV13) vaccine. One dose is recommended after age 33. Pneumococcal polysaccharide (PPSV23) vaccine. One dose is recommended after age 67. Talk to your health care provider about which screenings and vaccines you need and how often you need them. This information is not intended to replace advice given to you by your health care provider. Make sure you discuss any questions you have with your health care provider. Document Released: 03/01/2015 Document Revised: 10/23/2015 Document Reviewed: 12/04/2014 Elsevier Interactive Patient Education  2017 [place] Prevention in the Home Falls can cause injuries. They can happen to people of all ages. There are many things you can do to make your home safe and to help prevent falls. What can I do on the outside of my home? Regularly fix the edges of walkways and driveways and fix any cracks. Remove anything that might make you trip as you walk through a door, such as a raised step or threshold. Trim any bushes or trees on the path to your home. Use bright outdoor lighting. Clear any walking paths of anything that might make someone trip, such as rocks or tools. Regularly check to see if handrails are loose or broken. Make sure that both sides of any steps have handrails. Any raised decks and porches should have guardrails on the edges. Have any leaves, snow, or ice cleared regularly. Use sand or salt on walking paths during winter. Clean up any spills in your garage right away. This includes oil or grease spills. What can I do in the bathroom? Use night lights. Install grab bars by the toilet  and in the tub and shower. Do not use towel bars as grab bars. Use non-skid mats or decals in the tub or shower. If you need to sit down in the shower, use a plastic, non-slip stool. Keep the floor dry. Clean up any water that spills on the floor as soon as it happens. Remove soap buildup in the tub or shower regularly. Attach bath mats securely with double-sided non-slip rug tape. Do not have throw rugs and other things on the floor that can make you trip. What can I do in the bedroom? Use night lights. Make sure that you have a light by your bed that is easy to reach. Do not use any sheets or blankets that are too big for your bed. They should not hang down onto the floor. Have a firm chair that has side arms. You can use this for support while you get dressed. Do not have throw rugs and other things on the floor that can make you trip. What can I do in the kitchen? Clean up any spills right away. Avoid walking on wet floors. Keep items that you use a lot in  easy-to-reach places. If you need to reach something above you, use a strong step stool that has a grab bar. Keep electrical cords out of the way. Do not use floor polish or wax that makes floors slippery. If you must use wax, use non-skid floor wax. Do not have throw rugs and other things on the floor that can make you trip. What can I do with my stairs? Do not leave any items on the stairs. Make sure that there are handrails on both sides of the stairs and use them. Fix handrails that are broken or loose. Make sure that handrails are as long as the stairways. Check any carpeting to make sure that it is firmly attached to the stairs. Fix any carpet that is loose or worn. Avoid having throw rugs at the top or bottom of the stairs. If you do have throw rugs, attach them to the floor with carpet tape. Make sure that you have a light switch at the top of the stairs and the bottom of the stairs. If you do not have them, ask someone to add  them for you. What else can I do to help prevent falls? Wear shoes that: Do not have high heels. Have rubber bottoms. Are comfortable and fit you well. Are closed at the toe. Do not wear sandals. If you use a stepladder: Make sure that it is fully opened. Do not climb a closed stepladder. Make sure that both sides of the stepladder are locked into place. Ask someone to hold it for you, if possible. Clearly mark and make sure that you can see: Any grab bars or handrails. First and last steps. Where the edge of each step is. Use tools that help you move around (mobility aids) if they are needed. These include: Canes. Walkers. Scooters. Crutches. Turn on the lights when you go into a dark area. Replace any light bulbs as soon as they burn out. Set up your furniture so you have a clear path. Avoid moving your furniture around. If any of your floors are uneven, fix them. If there are any pets around you, be aware of where they are. Review your medicines with your doctor. Some medicines can make you feel dizzy. This can increase your chance of falling. Ask your doctor what other things that you can do to help prevent falls. This information is not intended to replace advice given to you by your health care provider. Make sure you discuss any questions you have with your health care provider. Document Released: 11/29/2008 Document Revised: 07/11/2015 Document Reviewed: 03/09/2014 Elsevier Interactive Patient Education  2017 [employer].

## 2022-04-03 ENCOUNTER — Ambulatory Visit (INDEPENDENT_AMBULATORY_CARE_PROVIDER_SITE_OTHER): Payer: No Typology Code available for payment source

## 2022-04-03 ENCOUNTER — Ambulatory Visit: Payer: No Typology Code available for payment source | Admitting: Obstetrics and Gynecology

## 2022-04-03 VITALS — Ht 65.0 in | Wt 208.0 lb

## 2022-04-03 DIAGNOSIS — Z0000 Encounter for general adult medical examination without abnormal findings: Secondary | ICD-10-CM

## 2022-04-03 DIAGNOSIS — Z780 Asymptomatic menopausal state: Secondary | ICD-10-CM

## 2022-04-06 ENCOUNTER — Encounter: Payer: Self-pay | Admitting: *Deleted

## 2022-04-10 ENCOUNTER — Ambulatory Visit: Payer: No Typology Code available for payment source | Admitting: Obstetrics and Gynecology

## 2022-04-17 ENCOUNTER — Ambulatory Visit: Payer: No Typology Code available for payment source | Admitting: Obstetrics and Gynecology

## 2022-04-22 DIAGNOSIS — M25512 Pain in left shoulder: Secondary | ICD-10-CM | POA: Diagnosis not present

## 2022-04-22 DIAGNOSIS — M19011 Primary osteoarthritis, right shoulder: Secondary | ICD-10-CM | POA: Diagnosis not present

## 2022-04-24 ENCOUNTER — Ambulatory Visit: Payer: No Typology Code available for payment source | Admitting: Obstetrics and Gynecology

## 2022-04-27 ENCOUNTER — Telehealth: Payer: Self-pay

## 2022-04-27 DIAGNOSIS — N644 Mastodynia: Secondary | ICD-10-CM

## 2022-04-27 NOTE — Telephone Encounter (Signed)
Referral to Dr. Blake Divine for her female partner thank you

## 2022-04-27 NOTE — Telephone Encounter (Signed)
Pt came to office before for pain under left breast Dr Nicki Reaper said if it was not improving he would make a referral pt is calling back to see if a referral can be placed.   Vaughan Basta [phone]

## 2022-04-28 NOTE — Addendum Note (Signed)
Addended by: Dairl Ponder on: 04/28/2022 11:45 AM   Modules accepted: Orders

## 2022-04-28 NOTE — Telephone Encounter (Signed)
Referral ordered in EPIC. 

## 2022-05-01 ENCOUNTER — Telehealth: Payer: Self-pay | Admitting: *Deleted

## 2022-05-01 ENCOUNTER — Ambulatory Visit: Payer: No Typology Code available for payment source | Admitting: Obstetrics and Gynecology

## 2022-05-01 NOTE — Telephone Encounter (Signed)
Patient notified and has appointment next Friday to discuss further

## 2022-05-01 NOTE — Telephone Encounter (Signed)
Per Dr Nicki Reaper:   I would recommend that you touch base with Ruth [name] let her know that she needs to do a up-to-date visit with myself to establish this issue in more detail before surgical specialist will agreed to see her. In other words we need an office visit thank you (if she has a lump in her breast or something worrisome lets do the appointment ASAP if it is just more breast pain lets do the appointment somewhere within the next 3 weeks thank you) if you need further input let me know

## 2022-05-01 NOTE — Telephone Encounter (Signed)
Left message to return call 

## 2022-05-05 DIAGNOSIS — L280 Lichen simplex chronicus: Secondary | ICD-10-CM | POA: Diagnosis not present

## 2022-05-05 DIAGNOSIS — L400 Psoriasis vulgaris: Secondary | ICD-10-CM | POA: Diagnosis not present

## 2022-05-08 ENCOUNTER — Ambulatory Visit (HOSPITAL_COMMUNITY)
Admission: RE | Admit: 2022-05-08 | Discharge: 2022-05-08 | Disposition: A | Discharge status: Home / Self Care | Payer: No Typology Code available for payment source | Source: Ambulatory Visit | Attending: Family Medicine | Admitting: Family Medicine

## 2022-05-08 ENCOUNTER — Ambulatory Visit (INDEPENDENT_AMBULATORY_CARE_PROVIDER_SITE_OTHER): Payer: No Typology Code available for payment source | Admitting: Family Medicine

## 2022-05-08 ENCOUNTER — Ambulatory Visit: Payer: No Typology Code available for payment source | Admitting: Obstetrics and Gynecology

## 2022-05-08 VITALS — BP 118/74 | HR 69 | Temp 97.4°F | Ht 65.0 in | Wt 199.0 lb

## 2022-05-08 DIAGNOSIS — Z780 Asymptomatic menopausal state: Secondary | ICD-10-CM | POA: Diagnosis not present

## 2022-05-08 DIAGNOSIS — R0781 Pleurodynia: Secondary | ICD-10-CM | POA: Diagnosis not present

## 2022-05-08 DIAGNOSIS — F324 Major depressive disorder, single episode, in partial remission: Secondary | ICD-10-CM

## 2022-05-08 DIAGNOSIS — Z1231 Encounter for screening mammogram for malignant neoplasm of breast: Secondary | ICD-10-CM

## 2022-05-08 DIAGNOSIS — J9811 Atelectasis: Secondary | ICD-10-CM | POA: Diagnosis not present

## 2022-05-08 DIAGNOSIS — G4730 Sleep apnea, unspecified: Secondary | ICD-10-CM | POA: Diagnosis not present

## 2022-05-08 DIAGNOSIS — R1314 Dysphagia, pharyngoesophageal phase: Secondary | ICD-10-CM | POA: Diagnosis not present

## 2022-05-08 MED ORDER — FLUOXETINE HCL 10 MG PO CAPS: ORAL_CAPSULE | ORAL | 4 refills | Status: DC

## 2022-05-08 NOTE — Progress Notes (Signed)
   Subjective:    [name], female    DOB: 1941-02-02, 82 y.o.   MRN: [id number]  HPI Trouble swallowing and choking, hx of laryngospasm intermittentlt  Depression discuss tablets versus capsules she feels the Prozac is doing well she would like to continue this  Breast pain still going on at times Intermittent ache sometimes an hour No breathing issues Sometimes radiates into the back at night Patient relates the pain in the lower ribs on the left side.  Happens anywhere from 10 to 30 minutes at a time.  Sometimes radiates to the back.  No abdominal symptoms with it.  No nausea vomiting sweats or chills.  Patient relates nothing seems to trigger it or get better  Also relates intermittent difficulty with swallowing in the esophageal region where it meets the pharynx.  At times feels like things get tight there.  Denies any sweats chills or other issues  Review of Systems     Objective:   Physical Exam General-in no acute distress Eyes-no discharge Lungs-respiratory rate normal, CTA CV-no murmurs,RRR Extremities skin warm dry no edema Neuro grossly normal Behavior normal, alert Nurse was present no mass was felt in the left lower ribs abdomen was soft       Assessment & Plan:  1. Rib pain on left side Recommend x-rays of the ribs unlikely to have a tumor or growth there.  I do not feel she needs to have a CAT scan but we will do x-ray to rule out possibility of other causes most likely this is costochondritis inflammation where the rib turns into cartilage Tylenol as needed for cool compress - DG Ribs Unilateral Left  2. Sleep apnea, unspecified type She has sleep apnea symptoms of snoring and restlessness at night feeling fatigued tired during the day does not sleep well at night she would benefit from sleep study evaluation - Ambulatory referral to Neurology  3. Pharyngoesophageal dysphagia She has difficulty swallowing she finds foods and sometimes  liquids get caught at the back of her throat where it enters into the esophagus.  Previously Dr.Rehman recommended for her to have swallowing study with speech therapy.  Given that the patient is having more symptoms we will push forward with getting this test done will involve gastroenterology depending on the outcomes and test results - DG SWALLOW STUDY OP MEDICARE SPEECH PATH  4. Post-menopausal Bone density recommended - DG Bone Density  5. Encounter for screening mammogram for breast cancer Mammogram recommended - MM 3D DIAGNOSTIC MAMMOGRAM BILATERAL BREAST  Major pression partial resolution Prozac 30 mg daily Follow-up 4 months

## 2022-05-12 ENCOUNTER — Telehealth: Payer: Self-pay

## 2022-05-12 ENCOUNTER — Other Ambulatory Visit (HOSPITAL_COMMUNITY): Payer: Self-pay | Admitting: Family Medicine

## 2022-05-12 ENCOUNTER — Telehealth: Payer: Self-pay | Admitting: Family Medicine

## 2022-05-12 ENCOUNTER — Other Ambulatory Visit: Payer: Self-pay

## 2022-05-12 DIAGNOSIS — Z1231 Encounter for screening mammogram for malignant neoplasm of breast: Secondary | ICD-10-CM

## 2022-05-12 NOTE — Telephone Encounter (Signed)
Nurses-the other day that I saw her I felt all in that area and did not find any worrisome findings but we are more than happy to help out by rechecking her.  She may be seen anywhere within the next 10 days not sure where there is an open slot

## 2022-05-12 NOTE — Telephone Encounter (Signed)
Patient still has concerns about her knot and wanting appointment with you . Please advise

## 2022-05-12 NOTE — Telephone Encounter (Signed)
Spoke with patient to give her bone density and mammogram appts, she stated the knot she called about today is located below the sternum bone, no pain and recently noticed. Please advise

## 2022-05-12 NOTE — Telephone Encounter (Signed)
See other telephone message 

## 2022-05-14 NOTE — Telephone Encounter (Signed)
Patient scheduled 05/18/2022 at 10:00 AM with Dr Nicki Reaper

## 2022-05-15 ENCOUNTER — Ambulatory Visit: Payer: No Typology Code available for payment source | Admitting: Obstetrics and Gynecology

## 2022-05-18 ENCOUNTER — Ambulatory Visit (INDEPENDENT_AMBULATORY_CARE_PROVIDER_SITE_OTHER): Payer: No Typology Code available for payment source | Admitting: Family Medicine

## 2022-05-18 VITALS — BP 126/72 | HR 71 | Ht 65.0 in | Wt 197.4 lb

## 2022-05-18 DIAGNOSIS — M7918 Myalgia, other site: Secondary | ICD-10-CM

## 2022-05-18 NOTE — Progress Notes (Signed)
   Subjective:    [name], female    DOB: 27-Aug-1940, 82 y.o.   MRN: [id number]  HPI Patient arrives today with a lump around her sternum.  She is concerned about a lump near the sternum.  Denies any previous trouble.  Was just recently seen.  Denies breast issues.  Has mammogram set up.  Review of Systems     Objective:   Physical Exam On physical exam the area that she is concerned about is a prominent xiphoid bone I find no evidence of other problem going on today       Assessment & Plan:  Musculoskeletal pain I do not recommend scan or x-rays currently Reassurance given patient to keep [place] posted on follow-up  She also has a raw area on her ear she is seeing a dermatologist for she wondered if it may be the cream causing it I see no evidence of infection if it persists she will follow-up with dermatology

## 2022-05-22 ENCOUNTER — Ambulatory Visit (HOSPITAL_COMMUNITY)
Admission: RE | Admit: 2022-05-22 | Discharge: 2022-05-22 | Disposition: A | Discharge status: Home / Self Care | Payer: No Typology Code available for payment source | Source: Ambulatory Visit | Attending: Family Medicine | Admitting: Family Medicine

## 2022-05-22 ENCOUNTER — Ambulatory Visit: Payer: No Typology Code available for payment source | Admitting: Obstetrics and Gynecology

## 2022-05-22 DIAGNOSIS — M810 Age-related osteoporosis without current pathological fracture: Secondary | ICD-10-CM | POA: Diagnosis not present

## 2022-05-22 DIAGNOSIS — Z1231 Encounter for screening mammogram for malignant neoplasm of breast: Secondary | ICD-10-CM | POA: Diagnosis not present

## 2022-05-22 DIAGNOSIS — Z780 Asymptomatic menopausal state: Secondary | ICD-10-CM | POA: Insufficient documentation

## 2022-05-25 ENCOUNTER — Encounter: Payer: Self-pay | Admitting: *Deleted

## 2022-05-29 ENCOUNTER — Ambulatory Visit: Payer: No Typology Code available for payment source | Admitting: Obstetrics and Gynecology

## 2022-06-05 ENCOUNTER — Ambulatory Visit: Payer: No Typology Code available for payment source | Admitting: Obstetrics and Gynecology

## 2022-06-08 ENCOUNTER — Telehealth: Payer: Self-pay | Admitting: Family Medicine

## 2022-06-08 DIAGNOSIS — K30 Functional dyspepsia: Secondary | ICD-10-CM

## 2022-06-08 NOTE — Telephone Encounter (Signed)
Patient still having stomach issues and would like you to get endoscopy set up . She doesn't want to see Dr. Duwaine Maxin.

## 2022-06-08 NOTE — Telephone Encounter (Signed)
Referral to Dr. Wilburt Finlay practice due to ongoing stomach issues and possible need for EGD-obviously please inform patient to keep her up today

## 2022-06-10 NOTE — Telephone Encounter (Signed)
Referral ordered in EPIC. Patient notified. 

## 2022-06-11 ENCOUNTER — Encounter (INDEPENDENT_AMBULATORY_CARE_PROVIDER_SITE_OTHER): Payer: Self-pay | Admitting: *Deleted

## 2022-06-12 ENCOUNTER — Ambulatory Visit: Payer: No Typology Code available for payment source | Admitting: Obstetrics and Gynecology

## 2022-07-09 ENCOUNTER — Ambulatory Visit: Payer: No Typology Code available for payment source | Admitting: Neurology

## 2022-07-09 ENCOUNTER — Encounter: Payer: Self-pay | Admitting: Neurology

## 2022-07-09 VITALS — BP 128/57 | HR 76 | Ht 63.0 in | Wt 184.0 lb

## 2022-07-09 DIAGNOSIS — Z9189 Other specified personal risk factors, not elsewhere classified: Secondary | ICD-10-CM | POA: Diagnosis not present

## 2022-07-09 DIAGNOSIS — G4719 Other hypersomnia: Secondary | ICD-10-CM

## 2022-07-09 DIAGNOSIS — R0683 Snoring: Secondary | ICD-10-CM

## 2022-07-09 DIAGNOSIS — Z820 Family history of epilepsy and other diseases of the nervous system: Secondary | ICD-10-CM | POA: Diagnosis not present

## 2022-07-09 DIAGNOSIS — E669 Obesity, unspecified: Secondary | ICD-10-CM

## 2022-07-09 DIAGNOSIS — R351 Nocturia: Secondary | ICD-10-CM

## 2022-07-09 NOTE — Progress Notes (Signed)
Subjective:    [name] is a 82 y.o. female.  HPI    Huston Foley, MD, PhD [facility] [address], [address] [address] [place], [place] [address]  Dear Dr. Gerda Diss,  I saw your patient, Ruth [name], upon your kind request in my sleep clinic today for initial consultation of her sleep disorder, in particular, concern for underlying obstructive sleep apnea.  The patient is accompanied by her husband today.  As you know, Ruth [name] is an 82 year old female with an underlying complex medical history of anemia, arthritis, asthma, eczema, depression, anxiety, fibromyalgia, reflux disease, history of kidney stones, hyperlipidemia, irritable bowel syndrome, prediabetes, and obesity, who reports snoring and excessive daytime somnolence and significant nocturia. Her Epworth sleepiness score is 8 out of 24, fatigue severity score is 63/63.  I reviewed your office note from 05/08/2022.  She has had difficulty initiating and maintaining sleep for years.  She has had worsening fatigue over the years, reports a diagnosis of fibromyalgia in the 90s and a diagnosis of chronic fatigue since the late 80s.  She lives with her husband, she had a tonsillectomy around age 88.  She is working on weight loss and has lost about 30 pounds thus far.  Bedtime is generally around 10 but it is variable, rise time also variable, they do not sleep in the same bedroom together, she sleeps in a recliner because of chronic pain.  She had tried [place] in the past, she had an episode of amnesia after taking it.  She takes occasional melatonin, she takes occasional magnesium or use lavender oil.  She has no recurrent nocturnal or morning headaches but has nocturia about 3-4 times per average night.  She reports a family history of sleep apnea, her sister had sleep apnea and her biological daughter has sleep apnea.  They also have a adopted son.  She drinks caffeine in the form of coffee, about 2  cups in the mornings, she is not a daily alcohol consumer but in the past couple of weeks she has had a small mixed drink at bedtime which she reports relaxes her at night.  She is a non-smoker.  She does have a TV on in her bedroom at night, she typically puts the TV on a sleep timer.  They have 1 dog in the household, the dog does not sleep in her bedroom. I had evaluated her years ago for tremors.  She reports that she had a sleep study some 10 years ago.  PAP therapy was not recommended at the time.  Previously (copied from previous notes for reference):    09/19/2018: 82 year old right-handed woman with an underlying medical history of asthma, arthritis, anxiety, depression, fibromyalgia, history of eczema, reflux disease, hyperlipidemia, irritable bowel syndrome, neck pain, sleep apnea, history of vertigo and obesity, who reports intermittent tremors affecting her upper and lower extremities. I reviewed your telemedicine office note from 08/25/2018.  She reported residual depression and suboptimally controlled anxiety at the time.  She was advised to continue with her antidepressant and cautioned regarding the use of Valium. She reports that she first noticed the tremor after she had her left knee replacement surgery in December.  She recalls that she woke up terrified.  Her tremor may have improved a little bit over time.  It has not gone away completely.  She is not aware of any family history of tremors, she does not know her father's medical history very much, he died of  lung cancer and she is not sure about her paternal side of family history.  She had a total of 3 sisters, 1 passed in childhood at age 28-1/2 and 1 sister passed away in her 40s from lung cancer, she has another sister in her 43s, she is the youngest of 31.  She has ongoing issues with some residual pain in the left knee.  She has finished physical therapy.  She has had a series of gel injections into the right knee which is painful.  She  does not drink caffeine very much on a day-to-day basis, some tea but typically no day-to-day coffee or soda.  She tries to hydrate well with water.  She has not fallen recently.  She has not noticed any other neurological symptoms, tremor is intermittent and primarily in her knee areas in both hands.  She has been on Prozac off and on since the 80s.  She had stopped it briefly before her knee surgery and then restarted it.  She finds that out of all antidepressants tried over time the Prozac has helped her the most.  She endorses stress, she reports that their son is autistic.  She is scheduled to see a counselor as I understand.  She has not pending she indicates.      Her Past Medical History Is Significant For: Past Medical History:  Diagnosis Date   Anemia    Anxiety    Arthritis    Asthma    Depression    Eczema    Fatty liver    Fibromyalgia    GERD (gastroesophageal reflux disease)    EGD/ colon 1/09   H pylori ulcer 1980-1990   s/p treatment   History of kidney stones    Hyperlipidemia    IBS (irritable bowel syndrome)    Kidney cysts    Laryngospasm    Neck pain    OSA (obstructive sleep apnea)    Pneumonia 2013   PONV (postoperative nausea and vomiting)    Pre-diabetes    Refusal of blood transfusions as patient is Jehovah's Witness     Her Past Surgical History Is Significant For: Past Surgical History:  Procedure Laterality Date   BILATERAL SALPINGOOPHORECTOMY  1990s   abdominal   BIOPSY  09/11/2020   Procedure: BIOPSY;  Surgeon: Malissa Hippo, MD;  [facility];  Service: Endoscopy;;  gastric   CARDIAC CATHETERIZATION  2002   [facility]) normal coronary arteries    COLONOSCOPY  01/2007   Dr. Laurie Panda   COLONOSCOPY N/A 04/18/2015   Procedure: COLONOSCOPY;  Surgeon: Malissa Hippo, MD;  [facility]: [facility];  Service: Endoscopy;  Laterality: N/A;  830 - moved to 8:55 - Ann to notify pt   COLONOSCOPY WITH ESOPHAGOGASTRODUODENOSCOPY (EGD)   02/16/2012   Procedure: COLONOSCOPY WITH ESOPHAGOGASTRODUODENOSCOPY (EGD);  Surgeon: Corbin Ade, MD;  [facility];  Service: Endoscopy;  Laterality: N/A;  8:45   COLONOSCOPY WITH PROPOFOL N/A 09/11/2020   Procedure: COLONOSCOPY WITH PROPOFOL;  Surgeon: Malissa Hippo, MD;  [facility];  Service: Endoscopy;  Laterality: N/A;  10:55   ESOPHAGEAL DILATION N/A 06/07/2014   Procedure: ESOPHAGEAL DILATION;  Surgeon: Malissa Hippo, MD;  [facility];  Service: Endoscopy;  Laterality: N/A;   ESOPHAGEAL DILATION N/A 09/11/2020   Procedure: ESOPHAGEAL DILATION;  Surgeon: Malissa Hippo, MD;  [facility];  Service: Endoscopy;  Laterality: N/A;   ESOPHAGOGASTRODUODENOSCOPY  01/2007   Dr Jarold Motto-  3 cm hiatal hernia, benign esophageal biopsies, erosive esophagitis, gastritis   ESOPHAGOGASTRODUODENOSCOPY N/A 06/07/2014   Procedure: ESOPHAGOGASTRODUODENOSCOPY (EGD);  Surgeon: Malissa Hippo, MD;  [facility];  Service: Endoscopy;  Laterality: N/A;  1200   ESOPHAGOGASTRODUODENOSCOPY N/A 08/18/2017   Procedure: ESOPHAGOGASTRODUODENOSCOPY (EGD);  Surgeon: Malissa Hippo, MD;  [facility];  Service: Endoscopy;  Laterality: N/A;  2:20   ESOPHAGOGASTRODUODENOSCOPY (EGD) WITH PROPOFOL N/A 09/11/2020   Procedure: ESOPHAGOGASTRODUODENOSCOPY (EGD) WITH PROPOFOL;  Surgeon: Malissa Hippo, MD;  [facility];  Service: Endoscopy;  Laterality: N/A;   FOOT SURGERY Left 2005   hammer toe   INNER EAR SURGERY     Left   KNEE SURGERY Left    x 2   POLYPECTOMY  09/11/2020   Procedure: POLYPECTOMY;  Surgeon: Malissa Hippo, MD;  [facility];  Service: Endoscopy;;   SHOULDER ARTHROSCOPY WITH ROTATOR CUFF REPAIR Right 05/07/2021   Procedure: SHOULDER ARTHROSCOPY WITH MANIPULATION UNDER ANESTHESIA ,SUBACROMIAL DECOMPRESSION, DISTAL CLAVICLE RESECTION, ROTATOR CUFF REPAIR , BICEPS TENOTOMY;  Surgeon: Eugenia Mcalpine, MD;   Location: [facility];  Service: Orthopedics;  Laterality: Right;  WITH INTERSCALENE BLOCK NEEDS PRE OP CONSULT  WITH ANESTHESIA 120   SHOULDER SURGERY Left 2011   TONSILLECTOMY     age 72   TOTAL KNEE ARTHROPLASTY Left 02/07/2018   Procedure: TOTAL KNEE ARTHROPLASTY;  Surgeon: Ollen Gross, MD;  Location: [facility];  Service: Orthopedics;  Laterality: Left;    TUBAL LIGATION  1970   with an appy   TYMPANOSTOMY TUBE PLACEMENT  1984   VAGINAL HYSTERECTOMY  1970    Her Family History Is Significant For: Family History  Problem Relation Age of Onset   Paranoid behavior Mother        depression   Colon polyps Mother    Eczema Mother    Depression Mother    Diabetes Father    Heart attack Father        65's   Lung cancer Father    Cirrhosis Father 43       etoh cirrhosis   Eczema Father    Lung cancer Sister    Depression Sister    Alzheimer's disease Sister    Hypertension Sister    Diabetes Sister    Lung cancer Sister    Sleep apnea Sister    Depression Maternal Aunt    COPD Daughter    Drug abuse Niece    Suicidality Niece    Allergic rhinitis Neg Hx    Asthma Neg Hx    Urticaria Neg Hx     Her Social History Is Significant For: Social History   Socioeconomic History   Marital status: Married    Spouse name: Molly Maduro   Number of children: 2   Years of education: Not on file   Highest education level: Some college, no degree  Occupational History   Occupation: Retired; Engineer, agricultural  Tobacco Use   Smoking status: Never    Passive exposure: Past   Smokeless tobacco: Never  Vaping Use   Vaping Use: Never used  Substance and Sexual Activity   Alcohol use: Yes    Alcohol/week: 0.0 standard drinks of alcohol    Comment: per patient very seldom   Drug use: No   Sexual activity: Not Currently    Partners: Male    Comment: Hysterectomy  Other Topics Concern   Not on file  Social History Narrative   Married 2 grown  children, 1 adopted son-autistic-lives  next door.   1 granddaughter   2 step grandsons   Social Determinants of Health   Financial Resource Strain: Patient Declined (05/14/2022)   Overall Financial Resource Strain (CARDIA)    Difficulty of Paying Living Expenses: Patient declined  Food Insecurity: Patient Declined (05/14/2022)   Hunger Vital Sign    Worried About Running Out of Food in the Last Year: Patient declined    Ran Out of Food in the Last Year: Patient declined  Transportation Needs: Patient Declined (05/14/2022)   PRAPARE - Administrator, Civil Service (Medical): Patient declined    Lack of Transportation (Non-Medical): Patient declined  Physical Activity: Inactive (05/14/2022)   Exercise Vital Sign    Days of Exercise per Week: 0 days    Minutes of Exercise per Session: 30 min  Stress: Stress Concern Present (05/14/2022)   [employer] of Occupational Health - Occupational Stress Questionnaire    Feeling of Stress : Rather much  Social Connections: Socially Integrated (05/14/2022)   Social Connection and Isolation Panel [NHANES]    Frequency of Communication with Friends and Family: More than three times a week    Frequency of Social Gatherings with Friends and Family: More than three times a week    Attends Religious Services: More than 4 times per year    Active Member of [employer] or Organizations: Yes    Attends Banker Meetings: 1 to 4 times per year    Marital Status: Married    Her Allergies Are:  Allergies  Allergen Reactions   Blood-Group Specific Substance Other (See Comments)    NO BLOOD PRODUCTS-refuses transfusions   Ciprofloxacin Other (See Comments)    Extreme fatigue   Codeine Nausea And Vomiting   Latex Other (See Comments)    Red and raw area around incision   Prozac [Fluoxetine]     Causing stomach pain and anxiety  - pt still takes daily   Sertraline Hcl Other (See Comments)    Made patient a "zombie"   Vicodin [Hydrocodone-Acetaminophen] Nausea And  Vomiting   Tramadol Swelling, Palpitations and Hypertension    Increased heart rate,increased blood pressure, edema  :   Her Current Medications Are:  Outpatient Encounter Medications as of 07/09/2022  Medication Sig   acetaminophen (TYLENOL) 500 MG tablet Take 1,000 mg by mouth every 6 (six) hours as needed for moderate pain.   acidophilus (RISAQUAD) CAPS capsule Take 1 capsule by mouth 4 (four) times a week.   albuterol (VENTOLIN HFA) 108 (90 Base) MCG/ACT inhaler Inhale 2 puffs into the lungs every 6 (six) hours as needed for wheezing.   Ascorbic Acid (VITAMIN C PO) Take 1 tablet by mouth 4 (four) times a week.   b complex vitamins capsule Take 1 capsule by mouth 4 (four) times a week.   dicyclomine (BENTYL) 10 MG capsule Take 1 capsule (10 mg total) by mouth 3 (three) times daily as needed. For stomach cramps   famotidine (PEPCID) 40 MG tablet Take 1 tablet (40 mg total) by mouth daily as needed.   FLUoxetine (PROZAC) 10 MG capsule 3 qd   fluticasone (CUTIVATE) 0.05 % cream Apply 1 application topically 2 (two) times daily.   ketoconazole (NIZORAL) 2 % cream Apply 1 application topically daily. (Patient taking differently: Apply 1 application  topically daily as needed for irritation.)   lidocaine-prilocaine (EMLA) cream Apply 1 Application topically as needed.   MAGNESIUM PO Take 1 tablet by mouth 4 (four)  times a week.   Multiple Vitamins-Minerals (ZINC PO) Take 1 tablet by mouth daily.   nitroGLYCERIN (NITROSTAT) 0.4 MG SL tablet as needed.   ondansetron (ZOFRAN) 4 MG tablet Take 1 tablet (4 mg total) by mouth every 8 (eight) hours as needed for nausea or vomiting.   Polyvinyl Alcohol (LUBRICANT DROPS OP) Place 2 drops into both eyes 3 (three) times daily as needed (for dry eyes).   estradiol (ESTRACE) 0.1 MG/GM vaginal cream Place 0.5 g vaginally 2 (two) times a week. Place 0.5g nightly for two weeks then twice a week after   oxyCODONE (ROXICODONE) 5 MG immediate release tablet 1/2 to  1 q 6 hours prn pain   No facility-administered encounter medications on file as of 07/09/2022.  :   Review of Systems:  Out of a complete 14 point review of systems, all are reviewed and negative with the exception of these symptoms as listed below:  Review of Systems  Neurological:        Pt here for sleep consult Pt has fatigue, little headaches ,snoring Pt denies hypertension,CPAP machine Pt states had sleep study done 10 years ago at [facility] pt states didn't sleep well Pt states was told she had mild OSA pt states PCP states no sleep OSA     ESS:8 FSS:63      Objective:  Neurological Exam  Physical Exam Physical Examination:   Vitals:   07/09/22 1506  BP: (!) 128/57  Pulse: 76    General Examination: The patient is a very pleasant 82 y.o. female in no acute distress. She appears well-developed and well-nourished and well groomed.   HEENT: Normocephalic, atraumatic, pupils are equal, round and reactive to light, extraocular tracking is good without limitation to gaze excursion or nystagmus noted. Hearing is grossly intact. Face is symmetric with normal facial animation. Speech is clear with no dysarthria noted. There is no hypophonia. There is no lip, neck/head, jaw or voice tremor. Neck is supple with full range of passive and active motion. There are no carotid bruits on auscultation. Oropharynx exam reveals: mild mouth dryness, adequate dental hygiene and moderate airway crowding, due to small airway entry but otherwise benign findings, Mallampati class I, tonsils absent.  Neck circumference 15-7/8 inches, she has a slight cross bite/underbite.  Tongue protrudes centrally and palate elevates symmetrically.  Chest: Clear to auscultation without wheezing, rhonchi or crackles noted.  Heart: S1+S2+0, regular and normal without murmurs, rubs or gallops noted.   Abdomen: Soft, non-tender and non-distended.  Extremities: There is no pitting edema in the distal lower  extremities bilaterally.   Skin: Warm and dry without trophic changes noted.   Musculoskeletal: exam reveals no obvious joint deformities.   Neurologically:  Mental status: The patient is awake, alert and oriented in all 4 spheres. Her immediate and remote memory, attention, language skills and fund of knowledge are appropriate. There is no evidence of aphasia, agnosia, apraxia or anomia. Speech is clear with normal prosody and enunciation. Thought process is linear. Mood is normal and affect is normal.  Cranial nerves II - XII are as described above under HEENT exam.  Motor exam: Normal bulk, strength and tone is noted. There is no obvious action or resting tremor.  Fine motor skills and coordination: grossly intact.  Cerebellar testing: No dysmetria or intention tremor. There is no truncal or gait ataxia.  Sensory exam: intact to light touch in the upper and lower extremities.  Gait, station and balance: She stands easily. No veering  to one side is noted. No leaning to one side is noted. Posture is age-appropriate and stance is narrow based. Gait shows normal stride length and normal pace. No problems turning are noted.   Assessment and Plan:  In summary, DARNITA BUICK is a very pleasant 82 y.o.-year old female with an underlying complex medical history of anemia, arthritis, asthma, eczema, depression, anxiety, fibromyalgia, reflux disease, history of kidney stones, hyperlipidemia, irritable bowel syndrome, prediabetes, and obesity, whose history and physical exam are concerning for sleep disordered breathing, particularly obstructive sleep apnea (OSA) versus upper airway resistance syndrome (UARS) versus central sleep apnea (CSA), or mixed sleep apnea. A laboratory attended sleep study is typically considered "gold standard" for evaluation of sleep disordered breathing.  She would like to proceed with a nocturnal polysomnogram, she would like to see if she can be scheduled the same night as her  husband who is pending a sleep consultation soon.  She reports that her husband has sleep apnea and uses a machine currently. I had a long chat with the patient and her husband about my findings and the diagnosis of sleep apnea, particularly OSA, its prognosis and treatment options. We talked about medical/conservative treatments, surgical interventions and non-pharmacological approaches for symptom control. I explained, in particular, the risks and ramifications of untreated moderate to severe OSA, especially with respect to developing cardiovascular disease down the road, including congestive heart failure (CHF), difficult to treat hypertension, cardiac arrhythmias (particularly A-fib), neurovascular complications including TIA, stroke and dementia. Even type 2 diabetes has, in part, been linked to untreated OSA. Symptoms of untreated OSA may include (but may not be limited to) daytime sleepiness, nocturia (i.e. frequent nighttime urination), memory problems, mood irritability and suboptimally controlled or worsening mood disorder such as depression and/or anxiety, lack of energy, lack of motivation, physical discomfort, as well as recurrent headaches, especially morning or nocturnal headaches. We talked about the importance of maintaining a healthy lifestyle and striving for healthy weight. In addition, we talked about the importance of striving for and maintaining good sleep hygiene. I recommended a sleep study at this time. I outlined the differences between a laboratory attended sleep study which is considered more comprehensive and accurate over the option of a home sleep test (HST); the latter may lead to underestimation of sleep disordered breathing in some instances and does not help with diagnosing upper airway resistance syndrome and is not accurate enough to diagnose primary central sleep apnea typically. I outlined possible surgical and non-surgical treatment options of OSA, including the use of a  positive airway pressure (PAP) device (i.e. CPAP, AutoPAP/APAP or BiPAP in certain circumstances), a custom-made dental device (aka oral appliance, which would require a referral to a specialist dentist or orthodontist typically, and is generally speaking not considered for patients with full dentures or edentulous state), upper airway surgical options, such as traditional UPPP (which is not considered a first-line treatment) or the Inspire device (hypoglossal nerve stimulator, which would involve a referral for consultation with an ENT surgeon, after careful selection, following inclusion criteria - also not first-line treatment). I explained the PAP treatment option to the patient in detail, as this is generally considered first-line treatment.  The patient indicated that she would be willing to try PAP therapy, if the need arises. I explained the importance of being compliant with PAP treatment, not only for insurance purposes but primarily to improve patient's symptoms symptoms, and for the patient's long term health benefit, including to reduce Her cardiovascular risks longer-term.  We will pick up our discussion about the next steps and treatment options after testing.  We will keep them posted as to the test results by phone call and/or MyChart messaging where possible.  We will plan to follow-up in sleep clinic accordingly as well.  I answered all their questions today and the patient and her husband were in agreement.   I encouraged them to call with any interim questions, concerns, problems or updates or email [place] through MyChart.  Generally speaking, sleep test authorizations may take up to 2 weeks, sometimes less, sometimes longer, the patient is encouraged to get in touch with [place] if they do not hear back from the sleep lab staff directly within the next 2 weeks.  Thank you very much for allowing me to participate in the care of this nice patient. If I can be of any further assistance to you  please do not hesitate to call me at [phone].  Sincerely,   Huston Foley, MD, PhD

## 2022-07-09 NOTE — Patient Instructions (Signed)

## 2022-07-15 ENCOUNTER — Ambulatory Visit: Payer: No Typology Code available for payment source | Admitting: Family Medicine

## 2022-07-15 VITALS — BP 131/78 | HR 69 | Wt 192.2 lb

## 2022-07-15 DIAGNOSIS — W57XXXA Bitten or stung by nonvenomous insect and other nonvenomous arthropods, initial encounter: Secondary | ICD-10-CM | POA: Diagnosis not present

## 2022-07-15 DIAGNOSIS — S40861A Insect bite (nonvenomous) of right upper arm, initial encounter: Secondary | ICD-10-CM

## 2022-07-15 MED ORDER — DOXYCYCLINE HYCLATE 100 MG PO TABS: 100.0000 mg | ORAL_TABLET | Freq: Two times a day (BID) | ORAL | 0 refills | Status: DC

## 2022-07-15 NOTE — Progress Notes (Signed)
   Subjective:    [name], female    DOB: 1940/06/13, 82 y.o.   MRN: [id number]  HPI Patient arrives today with a tick bite on right arm on the inside of the upper arm.  Tick bite Some redness No fever chills or sweats she has had some headache off and on the past couple days not severe  Review of Systems     Objective:   Physical Exam  General-in no acute distress Eyes-no discharge Lungs-respiratory rate normal, CTA CV-no murmurs,RRR Extremities skin warm dry no edema Neuro grossly normal Behavior normal, alert There is some redness approximately 3 mm in diameter with firmness around where the tick bite was       Assessment & Plan:  In regards to the tick bite I do not recommend doxycycline currently. Doxycycline printed with directions on when to take it

## 2022-07-27 ENCOUNTER — Ambulatory Visit (INDEPENDENT_AMBULATORY_CARE_PROVIDER_SITE_OTHER): Payer: No Typology Code available for payment source | Admitting: Gastroenterology

## 2022-07-27 ENCOUNTER — Encounter (INDEPENDENT_AMBULATORY_CARE_PROVIDER_SITE_OTHER): Payer: Self-pay | Admitting: Gastroenterology

## 2022-07-27 VITALS — BP 114/76 | HR 74 | Temp 98.0°F | Ht 63.0 in | Wt 188.9 lb

## 2022-07-27 DIAGNOSIS — R110 Nausea: Secondary | ICD-10-CM

## 2022-07-27 DIAGNOSIS — R1013 Epigastric pain: Secondary | ICD-10-CM

## 2022-07-27 NOTE — Progress Notes (Signed)
Referring Provider: Babs Sciara, MD Primary Care Physician:  Babs Sciara, MD Primary GI Physician: previously [facility]  Chief Complaint  Patient presents with   Abdominal Pain    Having pain in upper abdomen. Pain is like an ache kind of like when you are hungry. Eating does ease the pain. Pain is worse at night. Does have some nausea.    HPI:   Ruth [name] is a 82 y.o. female with past medical history of anemia, anxiety, arthritis, depression, fatty liver, fibromyalgia, GERD, H pylori ulcer, HLD, IBS, OSA.   Patient presenting today for upper abdominal pain.   Patient reports abdominal pain since "1970" though worse recently. She cannot pinpoint when it got worse. She notes previously having more esophageal pain, now pain in epigastric area. She has nausea but is unable to vomit. Appetite is okay. She notes sometimes eating makes pain worse and other times it improves it. She has pain that sometimes wakes her up at night. She notes pain initially was more LUQ but seems to travel from epigastric area more to her RUQ.  No recent medications. Only drinks ETOH on occasion. No NSAID use. Felt she had some early satiety a while back but none recently. She has occasional acid reflux symptoms but this is rare, she only takes famotidine 40mg  as needed with good results. History of stomach ulcer in the past.   She has had some weight loss but has been watching carb intake.   She has chronic history of constipation, though now stools are still formed but not as hard and dry as previously. Denies diarrhea. No rectal bleeding or melena.    Last Colonoscopy:08/2020  - One small polyp in the ascending colon. Biopsied.                           - External hemorrhoids.                           - Anal papilla(e) were hypertrophied. Last Endoscopy:08/2020- Normal hypopharynx.                           - Normal esophagus.                           - Z-line regular, 37 cm from the incisors.                            - 2 cm hiatal hernia.                           - No endoscopic esophageal abnormality to explain                            patient's dysphagia. Esophagus dilated. Dilated.                           - Gastritis. Biopsied.                           - Normal duodenal bulb and second portion of the  duodenum.  Recommendations:    Past Medical History:  Diagnosis Date   Anemia    Anxiety    Arthritis    Asthma    Depression    Eczema    Fatty liver    Fibromyalgia    GERD (gastroesophageal reflux disease)    EGD/ colon 1/09   H pylori ulcer 1980-1990   s/p treatment   History of kidney stones    Hyperlipidemia    IBS (irritable bowel syndrome)    Kidney cysts    Laryngospasm    Neck pain    OSA (obstructive sleep apnea)    Pneumonia 2013   PONV (postoperative nausea and vomiting)    Pre-diabetes    Refusal of blood transfusions as patient is Jehovah's Witness     Past Surgical History:  Procedure Laterality Date   BILATERAL SALPINGOOPHORECTOMY  1990s   abdominal   BIOPSY  09/11/2020   Procedure: BIOPSY;  Surgeon: Malissa Hippo, MD;  [facility];  Service: Endoscopy;;  gastric   CARDIAC CATHETERIZATION  2002   [facility]) normal coronary arteries    COLONOSCOPY  01/2007   Dr. Laurie Panda   COLONOSCOPY N/A 04/18/2015   Procedure: COLONOSCOPY;  Surgeon: Malissa Hippo, MD;  [facility];  Service: Endoscopy;  Laterality: N/A;  830 - moved to 8:55 - Ann to notify pt   COLONOSCOPY WITH ESOPHAGOGASTRODUODENOSCOPY (EGD)  02/16/2012   Procedure: COLONOSCOPY WITH ESOPHAGOGASTRODUODENOSCOPY (EGD);  Surgeon: Corbin Ade, MD;  [facility];  Service: Endoscopy;  Laterality: N/A;  8:45   COLONOSCOPY WITH PROPOFOL N/A 09/11/2020   Procedure: COLONOSCOPY WITH PROPOFOL;  Surgeon: Malissa Hippo, MD;  [facility];  Service: Endoscopy;  Laterality: N/A;  10:55   ESOPHAGEAL DILATION N/A  06/07/2014   Procedure: ESOPHAGEAL DILATION;  Surgeon: Malissa Hippo, MD;  [facility];  Service: Endoscopy;  Laterality: N/A;   ESOPHAGEAL DILATION N/A 09/11/2020   Procedure: ESOPHAGEAL DILATION;  Surgeon: Malissa Hippo, MD;  [facility];  Service: Endoscopy;  Laterality: N/A;   ESOPHAGOGASTRODUODENOSCOPY  01/2007   Dr Jarold Motto- 3 cm hiatal hernia, benign esophageal biopsies, erosive esophagitis, gastritis   ESOPHAGOGASTRODUODENOSCOPY N/A 06/07/2014   Procedure: ESOPHAGOGASTRODUODENOSCOPY (EGD);  Surgeon: Malissa Hippo, MD;  [facility];  Service: Endoscopy;  Laterality: N/A;  1200   ESOPHAGOGASTRODUODENOSCOPY N/A 08/18/2017   Procedure: ESOPHAGOGASTRODUODENOSCOPY (EGD);  Surgeon: Malissa Hippo, MD;  [facility];  Service: Endoscopy;  Laterality: N/A;  2:20   ESOPHAGOGASTRODUODENOSCOPY (EGD) WITH PROPOFOL N/A 09/11/2020   Procedure: ESOPHAGOGASTRODUODENOSCOPY (EGD) WITH PROPOFOL;  Surgeon: Malissa Hippo, MD;  [facility];  Service: Endoscopy;  Laterality: N/A;   FOOT SURGERY Left 2005   hammer toe   INNER EAR SURGERY     Left   KNEE SURGERY Left    x 2   POLYPECTOMY  09/11/2020   Procedure: POLYPECTOMY;  Surgeon: Malissa Hippo, MD;  [facility];  Service: Endoscopy;;   SHOULDER ARTHROSCOPY WITH ROTATOR CUFF REPAIR Right 05/07/2021   Procedure: SHOULDER ARTHROSCOPY WITH MANIPULATION UNDER ANESTHESIA ,SUBACROMIAL DECOMPRESSION, DISTAL CLAVICLE RESECTION, ROTATOR CUFF REPAIR , BICEPS TENOTOMY;  Surgeon: Eugenia Mcalpine, MD;  Location: [facility];  Service: Orthopedics;  Laterality: Right;  WITH INTERSCALENE BLOCK NEEDS PRE OP CONSULT  WITH ANESTHESIA 120   SHOULDER SURGERY Left 2011   TONSILLECTOMY     age 66   TOTAL KNEE ARTHROPLASTY Left  02/07/2018   Procedure: TOTAL KNEE ARTHROPLASTY;  Surgeon: Ollen Gross, MD;  Location: [facility];  Service: Orthopedics;  Laterality: Left;    TUBAL LIGATION   1970   with an appy   TYMPANOSTOMY TUBE PLACEMENT  1984   VAGINAL HYSTERECTOMY  1970    Current Outpatient Medications  Medication Sig Dispense Refill   acetaminophen (TYLENOL) 500 MG tablet Take 1,000 mg by mouth every 6 (six) hours as needed for moderate pain.     acidophilus (RISAQUAD) CAPS capsule Take 1 capsule by mouth 4 (four) times a week.     albuterol (VENTOLIN HFA) 108 (90 Base) MCG/ACT inhaler Inhale 2 puffs into the lungs every 6 (six) hours as needed for wheezing. 1 each 2   Ascorbic Acid (VITAMIN C PO) Take 1 tablet by mouth 4 (four) times a week.     b complex vitamins capsule Take 1 capsule by mouth 4 (four) times a week.     dicyclomine (BENTYL) 10 MG capsule Take 1 capsule (10 mg total) by mouth 3 (three) times daily as needed. For stomach cramps 90 capsule 5   famotidine (PEPCID) 40 MG tablet Take 1 tablet (40 mg total) by mouth daily as needed. 90 tablet 1   FLUoxetine (PROZAC) 10 MG capsule 3 qd 90 capsule 4   fluticasone (CUTIVATE) 0.05 % cream Apply 1 application topically 2 (two) times daily.     ketoconazole (NIZORAL) 2 % cream Apply 1 application topically daily. (Patient taking differently: Apply 1 application  topically daily as needed for irritation.) 60 g 5   lidocaine-prilocaine (EMLA) cream Apply 1 Application topically as needed. 30 g 2   MAGNESIUM PO Take 1 tablet by mouth 4 (four) times a week.     nitroGLYCERIN (NITROSTAT) 0.4 MG SL tablet as needed.     ondansetron (ZOFRAN) 4 MG tablet Take 1 tablet (4 mg total) by mouth every 8 (eight) hours as needed for nausea or vomiting. 20 tablet 0   oxyCODONE (ROXICODONE) 5 MG immediate release tablet 1/2 to 1 q 6 hours prn pain 10 tablet 0   Polyvinyl Alcohol (LUBRICANT DROPS OP) Place 2 drops into both eyes 3 (three) times daily as needed (for dry eyes).     No current facility-administered medications for this visit.    Allergies as of 07/27/2022 - Review Complete 07/27/2022  Allergen Reaction Noted    Blood-group specific substance Other (See Comments) 02/16/2012   Ciprofloxacin Other (See Comments) 02/11/2009   Codeine Nausea And Vomiting 02/11/2009   Latex Other (See Comments) 08/16/2017   Prozac [fluoxetine]  05/10/2020   Sertraline hcl Other (See Comments) 05/10/2020   Vicodin [hydrocodone-acetaminophen] Nausea And Vomiting 09/23/2015   Tramadol Swelling, Palpitations, and Hypertension 01/23/2015    Family History  Problem Relation Age of Onset   Paranoid behavior Mother        depression   Colon polyps Mother    Eczema Mother    Depression Mother    Diabetes Father    Heart attack Father        39's   Lung cancer Father    Cirrhosis Father 49       etoh cirrhosis   Eczema Father    Lung cancer Sister    Depression Sister    Alzheimer's disease Sister    Hypertension Sister    Diabetes Sister    Lung cancer Sister    Sleep apnea Sister    Depression Maternal Aunt    COPD Daughter  Drug abuse Niece    Suicidality Niece    Allergic rhinitis Neg Hx    Asthma Neg Hx    Urticaria Neg Hx     Social History   Socioeconomic History   Marital status: Married    Spouse name: Molly Maduro   Number of children: 2   Years of education: Not on file   Highest education level: Some college, no degree  Occupational History   Occupation: Retired; Engineer, agricultural  Tobacco Use   Smoking status: Never    Passive exposure: Past   Smokeless tobacco: Never  Vaping Use   Vaping Use: Never used  Substance and Sexual Activity   Alcohol use: Yes    Alcohol/week: 0.0 standard drinks of alcohol    Comment: per patient very seldom   Drug use: No   Sexual activity: Not Currently    Partners: Male    Comment: Hysterectomy  Other Topics Concern   Not on file  Social History Narrative   Married 2 grown children, 1 adopted son-autistic-lives next door.   1 granddaughter   2 step grandsons   Social Determinants of Health   Financial Resource Strain: Patient Declined (05/14/2022)    Overall Financial Resource Strain (CARDIA)    Difficulty of Paying Living Expenses: Patient declined  Food Insecurity: Patient Declined (05/14/2022)   Hunger Vital Sign    Worried About Running Out of Food in the Last Year: Patient declined    Ran Out of Food in the Last Year: Patient declined  Transportation Needs: Patient Declined (05/14/2022)   PRAPARE - Administrator, Civil Service (Medical): Patient declined    Lack of Transportation (Non-Medical): Patient declined  Physical Activity: Inactive (05/14/2022)   Exercise Vital Sign    Days of Exercise per Week: 0 days    Minutes of Exercise per Session: 30 min  Stress: Stress Concern Present (05/14/2022)   [employer] of Occupational Health - Occupational Stress Questionnaire    Feeling of Stress : Rather much  Social Connections: Socially Integrated (05/14/2022)   Social Connection and Isolation Panel [NHANES]    Frequency of Communication with Friends and Family: More than three times a week    Frequency of Social Gatherings with Friends and Family: More than three times a week    Attends Religious Services: More than 4 times per year    Active Member of [employer] or Organizations: Yes    Attends Banker Meetings: 1 to 4 times per year    Marital Status: Married    Review of systems General: negative for malaise, night sweats, fever, chills, weight loss Neck: Negative for lumps, goiter, pain and significant neck swelling Resp: Negative for cough, wheezing, dyspnea at rest CV: Negative for chest pain, leg swelling, palpitations, orthopnea GI: denies melena, hematochezia, vomiting, diarrhea, constipation, dysphagia, odyonophagia, early satiety or unintentional weight loss. +nausea +epigastric pain  MSK: Negative for joint pain or swelling, back pain, and muscle pain. Derm: Negative for itching or rash Psych: Denies depression, anxiety, memory loss, confusion. No homicidal or suicidal ideation.  Heme:  Negative for prolonged bleeding, bruising easily, and swollen nodes. Endocrine: Negative for cold or heat intolerance, polyuria, polydipsia and goiter. Neuro: negative for tremor, gait imbalance, syncope and seizures. The remainder of the review of systems is noncontributory.  Physical Exam: There were no vitals taken for this visit. General:   Alert and oriented. No distress noted. Pleasant and cooperative.  Head:  Normocephalic and atraumatic. Eyes:  Conjuctiva clear  without scleral icterus. Mouth:  Oral mucosa pink and moist. Good dentition. No lesions. Heart: Normal rate and rhythm, s1 and s2 heart sounds present.  Lungs: Clear lung sounds in all lobes. Respirations equal and unlabored. Abdomen:  +BS, soft, and non-distended. TTP of epigastric region and mid to RUQ abdomen. No rebound or guarding. No HSM or masses noted. Derm: No palmar erythema or jaundice Msk:  Symmetrical without gross deformities. Normal posture. Extremities:  Without edema. Neurologic:  Alert and  oriented x4 Psych:  Alert and cooperative. Normal mood and affect.  Invalid input(s): "6 MONTHS"   ASSESSMENT: Ruth [name] is a 81 y.o. female presenting today for epigastric pain and nausea  Patient with epigastric pain and nausea, though timing of symptoms is unclear. No unintentional weight loss, early satiety, melena. Pain sometimes worse with eating, though sometimes improved. No recent med changes, NSAID use or ETOH. History of Stomach ulcer that appears to be secondary to H pylori in the past. Recommend proceeding with EGD for further evaluation as I cannot rule out PUD, gastritis, duodenitis, H pylori. Indications, risks and benefits of procedure discussed in detail with patient. Patient verbalized understanding and is in agreement to proceed with EGD.    PLAN:  Continue with famotidine 40mg  PRN  2. Proceed with EGD-ASA III    All questions were answered, patient verbalized understanding and is in  agreement with plan as outlined above.    Follow Up: 3 months   Abella Shugart L. Jeanmarie Hubert, MSN, APRN, AGNP-C Adult-Gerontology Nurse Practitioner [facility]  I have reviewed the note and agree with the APP's assessment as described in this progress note  Katrinka Blazing, MD Gastroenterology and Hepatology [facility]

## 2022-07-27 NOTE — Patient Instructions (Signed)
We will get you scheduled for upper endoscopy for further evaluation of your symptoms  Follow up 3 months

## 2022-07-28 ENCOUNTER — Telehealth: Payer: Self-pay | Admitting: Neurology

## 2022-07-28 ENCOUNTER — Encounter (INDEPENDENT_AMBULATORY_CARE_PROVIDER_SITE_OTHER): Payer: Self-pay

## 2022-07-28 NOTE — Telephone Encounter (Signed)
Docia Barrier: [id number] (Exp. 07/28/22 to 11/17/22)

## 2022-07-28 NOTE — Telephone Encounter (Signed)
Devoted pending uploaded notes

## 2022-07-29 ENCOUNTER — Encounter (INDEPENDENT_AMBULATORY_CARE_PROVIDER_SITE_OTHER): Payer: Self-pay

## 2022-08-06 ENCOUNTER — Telehealth: Payer: Self-pay | Admitting: Family Medicine

## 2022-08-06 ENCOUNTER — Telehealth: Payer: Self-pay | Admitting: *Deleted

## 2022-08-06 DIAGNOSIS — Z79899 Other long term (current) drug therapy: Secondary | ICD-10-CM

## 2022-08-06 DIAGNOSIS — E785 Hyperlipidemia, unspecified: Secondary | ICD-10-CM

## 2022-08-06 DIAGNOSIS — R7303 Prediabetes: Secondary | ICD-10-CM

## 2022-08-06 NOTE — Telephone Encounter (Signed)
Nurses Orthopedics is desiring for her to have shoulder surgery.  They are requesting preoperative risk assessment medically I would recommend an office visit either the week of July 1 or July 8 Also recommend metabolic 7 and A1c and lipid Diagnosis prediabetes, high risk med and hyperlipidemia

## 2022-08-06 NOTE — Telephone Encounter (Signed)
   Pre-operative Risk Assessment    Patient Name: Ruth [name]  DOB: [birth year] MRN: [id number]      Request for Surgical Clearance    Procedure:   RIGHT REVERSE SHOULDER ARTHROPLASTY  Date of Surgery:  Clearance TBD                                 Surgeon:  DR. Caryn Bee SUPPLE Surgeon's Group or Practice Name:  [facility] Phone number:  [phone] ATTN: Aida Raider Fax number:  [phone]   Type of Clearance Requested:   - Medical ; NO MEDICATIONS LISTED AS NEEDING TO BE HELD   Type of Anesthesia:  General    Additional requests/questions:    [name]   08/06/2022, 6:01 PM

## 2022-08-07 ENCOUNTER — Telehealth: Payer: Self-pay | Admitting: *Deleted

## 2022-08-07 NOTE — Telephone Encounter (Signed)
Pt has been scheduled for tele pre op appt 08/13/22 @ 10 am. Med rec and consent are done.

## 2022-08-07 NOTE — Telephone Encounter (Signed)
   Name: Ruth [name]  DOB: 1940/12/18  MRN: [id number]  Primary Cardiologist: Charlton Haws, MD   Preoperative team, please contact this patient and set up a phone call appointment for further preoperative risk assessment. Please obtain consent and complete medication review. Thank you for your help.  I confirm that guidance regarding antiplatelet and oral anticoagulation therapy has been completed and, if necessary, noted below.  None requested    Ronney Asters, NP 08/07/2022, 9:55 AM [facility]

## 2022-08-07 NOTE — Telephone Encounter (Signed)
Pt has been scheduled for tele pre op appt 08/13/22 @ 10 am. Med rec and consent are done.     Patient Consent for Virtual Visit        Ruth [name] has provided verbal consent on 08/07/2022 for a virtual visit (video or telephone).   CONSENT FOR VIRTUAL VISIT FOR:  Ruth [name]  By participating in this virtual visit I agree to the following:  I hereby voluntarily request, consent and authorize [facility] and its employed or contracted physicians, physician assistants, nurse practitioners or other licensed health care professionals (the Practitioner), to provide me with telemedicine health care services (the "Services") as deemed necessary by the treating Practitioner. I acknowledge and consent to receive the Services by the Practitioner via telemedicine. I understand that the telemedicine visit will involve communicating with the Practitioner through live audiovisual communication technology and the disclosure of certain medical information by electronic transmission. I acknowledge that I have been given the opportunity to request an in-person assessment or other available alternative prior to the telemedicine visit and am voluntarily participating in the telemedicine visit.  I understand that I have the right to withhold or withdraw my consent to the use of telemedicine in the course of my care at any time, without affecting my right to future care or treatment, and that the Practitioner or I may terminate the telemedicine visit at any time. I understand that I have the right to inspect all information obtained and/or recorded in the course of the telemedicine visit and may receive copies of available information for a reasonable fee.  I understand that some of the potential risks of receiving the Services via telemedicine include:  Delay or interruption in medical evaluation due to technological equipment failure or disruption; Information transmitted may not be sufficient (e.g.  poor resolution of images) to allow for appropriate medical decision making by the Practitioner; and/or  In rare instances, security protocols could fail, causing a breach of personal health information.  Furthermore, I acknowledge that it is my responsibility to provide information about my medical history, conditions and care that is complete and accurate to the best of my ability. I acknowledge that Practitioner's advice, recommendations, and/or decision may be based on factors not within their control, such as incomplete or inaccurate data provided by me or distortions of diagnostic images or specimens that may result from electronic transmissions. I understand that the practice of medicine is not an exact science and that Practitioner makes no warranties or guarantees regarding treatment outcomes. I acknowledge that a copy of this consent can be made available to me via my patient portal [facility] MyChart), or I can request a printed copy by calling the office of [facility].    I understand that my insurance will be billed for this visit.   I have read or had this consent read to me. I understand the contents of this consent, which adequately explains the benefits and risks of the Services being provided via telemedicine.  I have been provided ample opportunity to ask questions regarding this consent and the Services and have had my questions answered to my satisfaction. I give my informed consent for the services to be provided through the use of telemedicine in my medical care

## 2022-08-11 NOTE — Telephone Encounter (Signed)
See telephone note.

## 2022-08-11 NOTE — Telephone Encounter (Signed)
Called patient and ordered labs in EPIC. Appointment scheduled and patient notified.

## 2022-08-11 NOTE — Addendum Note (Signed)
Addended by: Elizbeth Squires on: 08/11/2022 04:33 PM   Modules accepted: Orders

## 2022-08-13 ENCOUNTER — Ambulatory Visit: Payer: No Typology Code available for payment source | Attending: Cardiology

## 2022-08-13 DIAGNOSIS — Z01810 Encounter for preprocedural cardiovascular examination: Secondary | ICD-10-CM | POA: Diagnosis not present

## 2022-08-13 NOTE — Progress Notes (Signed)
Virtual Visit via Telephone Note   Because of Ruth [name]'s co-morbid illnesses, she is at least at moderate risk for complications without adequate follow up.  This format is felt to be most appropriate for this patient at this time.  The patient did not have access to video technology/had technical difficulties with video requiring transitioning to audio format only (telephone).  All issues noted in this document were discussed and addressed.  No physical exam could be performed with this format.  Please refer to the patient's chart for her consent to telehealth for [facility].  Evaluation Performed:  Preoperative cardiovascular risk assessment _____________   Date:  08/13/2022   Patient ID:  Ruth [name], DOB Aug 09, 1940, MRN [id number] Patient Location:  Home Provider location:   Office  Primary Care Provider:  Babs Sciara, MD Primary Cardiologist:  Charlton Haws, MD  Chief Complaint / Patient Profile   82 y.o. y/o female with a h/o palpitations, GERD, fibromyalgia who is pending right reverse shoulder arthroplasty and presents today for telephonic preoperative cardiovascular risk assessment.  History of Present Illness    Ruth [name] is a 82 y.o. female who presents via audio/video conferencing for a telehealth visit today.  Pt was last seen in cardiology clinic on 10/01/2021 by Dr.Nishan. At that time Ruth [name] was doing well with complaint of dyspnea and underwent calcium scoring that was normal. The patient is now pending procedure as outlined above. Since her last visit, she has been doing well with no new cardiac complaints.  She reports her shortness of breath has improved but does feel occasional palpitations mainly occur when drinking coffee.  Past Medical History    Past Medical History:  Diagnosis Date   Anemia    Anxiety    Arthritis    Asthma    Depression    Eczema    Fatty liver    Fibromyalgia    GERD (gastroesophageal reflux  disease)    EGD/ colon 1/09   H pylori ulcer 1980-1990   s/p treatment   History of kidney stones    Hyperlipidemia    IBS (irritable bowel syndrome)    Kidney cysts    Laryngospasm    Neck pain    OSA (obstructive sleep apnea)    Pneumonia 2013   PONV (postoperative nausea and vomiting)    Pre-diabetes    Refusal of blood transfusions as patient is Jehovah's Witness    Past Surgical History:  Procedure Laterality Date   BILATERAL SALPINGOOPHORECTOMY  1990s   abdominal   BIOPSY  09/11/2020   Procedure: BIOPSY;  Surgeon: Malissa Hippo, MD;  [facility];  Service: Endoscopy;;  gastric   CARDIAC CATHETERIZATION  2002   [facility]) normal coronary arteries    COLONOSCOPY  01/2007   Dr. Laurie Panda   COLONOSCOPY N/A 04/18/2015   Procedure: COLONOSCOPY;  Surgeon: Malissa Hippo, MD;  [facility];  Service: Endoscopy;  Laterality: N/A;  830 - moved to 8:55 - Ann to notify pt   COLONOSCOPY WITH ESOPHAGOGASTRODUODENOSCOPY (EGD)  02/16/2012   Procedure: COLONOSCOPY WITH ESOPHAGOGASTRODUODENOSCOPY (EGD);  Surgeon: Corbin Ade, MD;  [facility];  Service: Endoscopy;  Laterality: N/A;  8:45   COLONOSCOPY WITH PROPOFOL N/A 09/11/2020   Procedure: COLONOSCOPY WITH PROPOFOL;  Surgeon: Malissa Hippo, MD;  [facility];  Service: Endoscopy;  Laterality: N/A;  10:55   ESOPHAGEAL DILATION N/A 06/07/2014   Procedure: ESOPHAGEAL DILATION;  Surgeon: Malissa Hippo, MD;  [facility];  Service: Endoscopy;  Laterality: N/A;   ESOPHAGEAL DILATION N/A 09/11/2020   Procedure: ESOPHAGEAL DILATION;  Surgeon: Malissa Hippo, MD;  [facility];  Service: Endoscopy;  Laterality: N/A;   ESOPHAGOGASTRODUODENOSCOPY  01/2007   Dr Jarold Motto- 3 cm hiatal hernia, benign esophageal biopsies, erosive esophagitis, gastritis   ESOPHAGOGASTRODUODENOSCOPY N/A 06/07/2014   Procedure: ESOPHAGOGASTRODUODENOSCOPY (EGD);  Surgeon: Malissa Hippo,  MD;  [facility];  Service: Endoscopy;  Laterality: N/A;  1200   ESOPHAGOGASTRODUODENOSCOPY N/A 08/18/2017   Procedure: ESOPHAGOGASTRODUODENOSCOPY (EGD);  Surgeon: Malissa Hippo, MD;  [facility];  Service: Endoscopy;  Laterality: N/A;  2:20   ESOPHAGOGASTRODUODENOSCOPY (EGD) WITH PROPOFOL N/A 09/11/2020   Procedure: ESOPHAGOGASTRODUODENOSCOPY (EGD) WITH PROPOFOL;  Surgeon: Malissa Hippo, MD;  [facility];  Service: Endoscopy;  Laterality: N/A;   FOOT SURGERY Left 2005   hammer toe   INNER EAR SURGERY     Left   KNEE SURGERY Left    x 2   POLYPECTOMY  09/11/2020   Procedure: POLYPECTOMY;  Surgeon: Malissa Hippo, MD;  [facility];  Service: Endoscopy;;   SHOULDER ARTHROSCOPY WITH ROTATOR CUFF REPAIR Right 05/07/2021   Procedure: SHOULDER ARTHROSCOPY WITH MANIPULATION UNDER ANESTHESIA ,SUBACROMIAL DECOMPRESSION, DISTAL CLAVICLE RESECTION, ROTATOR CUFF REPAIR , BICEPS TENOTOMY;  Surgeon: Eugenia Mcalpine, MD;  Location: [facility];  Service: Orthopedics;  Laterality: Right;  WITH INTERSCALENE BLOCK NEEDS PRE OP CONSULT  WITH ANESTHESIA 120   SHOULDER SURGERY Left 2011   TONSILLECTOMY     age 14   TOTAL KNEE ARTHROPLASTY Left 02/07/2018   Procedure: TOTAL KNEE ARTHROPLASTY;  Surgeon: Ollen Gross, MD;  [facility];  Service: Orthopedics;  Laterality: Left;    TUBAL LIGATION  1970   with an appy   TYMPANOSTOMY TUBE PLACEMENT  1984   VAGINAL HYSTERECTOMY  1970    Allergies  Allergies  Allergen Reactions   Blood-Group Specific Substance Other (See Comments)    NO BLOOD PRODUCTS-refuses transfusions   Ciprofloxacin Other (See Comments)    Extreme fatigue   Codeine Nausea And Vomiting   Latex Other (See Comments)    Red and raw area around incision   Prozac [Fluoxetine]     Causing stomach pain and anxiety  - pt still takes daily   Sertraline Hcl Other (See Comments)    Made patient a "zombie"   Vicodin  [Hydrocodone-Acetaminophen] Nausea And Vomiting   Tramadol Swelling, Palpitations and Hypertension    Increased heart rate,increased blood pressure, edema    Home Medications    Prior to Admission medications   Medication Sig Start Date End Date Taking? Authorizing Provider  acetaminophen (TYLENOL) 500 MG tablet Take 1,000 mg by mouth every 6 (six) hours as needed for moderate pain.    [provider]  acidophilus (RISAQUAD) CAPS capsule Take 1 capsule by mouth 4 (four) times a week.    [provider]  albuterol (VENTOLIN HFA) 108 (90 Base) MCG/ACT inhaler Inhale 2 puffs into the lungs every 6 (six) hours as needed for wheezing. 02/03/22   Babs Sciara, MD  Ascorbic Acid (VITAMIN C PO) Take 1 tablet by mouth 4 (four) times a week.    [provider]  b complex vitamins capsule Take 1 capsule by mouth 4 (four) times a week.    [provider]  dicyclomine (BENTYL) 10 MG capsule Take 1 capsule (10 mg  total) by mouth 3 (three) times daily as needed. For stomach cramps 11/24/21   Babs Sciara, MD  famotidine (PEPCID) 40 MG tablet Take 1 tablet (40 mg total) by mouth daily as needed. 01/13/22   Babs Sciara, MD  FLUoxetine (PROZAC) 10 MG capsule 3 qd 05/08/22   Babs Sciara, MD  fluticasone (CUTIVATE) 0.05 % cream Apply 1 application topically 2 (two) times daily.    [provider]  ketoconazole (NIZORAL) 2 % cream Apply 1 application topically daily. Patient taking differently: Apply 1 application  topically daily as needed for irritation. 02/21/20   Novella Olive, FNP  lidocaine-prilocaine (EMLA) cream Apply 1 Application topically as needed. 03/13/22   Marguerita Beards, MD  MAGNESIUM PO Take 1 tablet by mouth 4 (four) times a week.    [provider]  nitroGLYCERIN (NITROSTAT) 0.4 MG SL tablet as needed. 04/14/16   [provider]  ondansetron (ZOFRAN) 4 MG tablet Take 1 tablet (4 mg total) by mouth every 8 (eight)  hours as needed for nausea or vomiting. 12/16/21   Babs Sciara, MD  oxyCODONE (ROXICODONE) 5 MG immediate release tablet 1/2 to 1 q 6 hours prn pain 03/19/22   Babs Sciara, MD  Polyvinyl Alcohol (LUBRICANT DROPS OP) Place 2 drops into both eyes 3 (three) times daily as needed (for dry eyes).    [provider]    Physical Exam    Vital Signs:  Ruth [name] does not have vital signs available for review today.  Given telephonic nature of communication, physical exam is limited. AAOx3. NAD. Normal affect.  Speech and respirations are unlabored.  Accessory Clinical Findings    None  Assessment & Plan    1.  Preoperative Cardiovascular Risk Assessment:  Patient's RCRI score is 0.4%  The patient affirms she has been doing well without any new cardiac symptoms. They are able to achieve 4 METS without cardiac limitations. Therefore, based on ACC/AHA guidelines, the patient would be at acceptable risk for the planned procedure without further cardiovascular testing. The patient was advised that if she develops new symptoms prior to surgery to contact our office to arrange for a follow-up visit, and she verbalized understanding.   The patient was advised that if she develops new symptoms prior to surgery to contact our office to arrange for a follow-up visit, and she verbalized understanding.   A copy of this note will be routed to requesting surgeon.  Time:   Today, I have spent 6 minutes with the patient with telehealth technology discussing medical history, symptoms, and management plan.     Napoleon Form, Leodis Rains, NP  08/13/2022, 7:02 AM

## 2022-08-17 ENCOUNTER — Ambulatory Visit: Payer: No Typology Code available for payment source | Admitting: Family Medicine

## 2022-08-17 NOTE — Telephone Encounter (Signed)
[facility]- Devoted Ruth [name]: [id number] (Exp. 07/28/22 to 11/17/22)   Patient is scheduled at [facility] for 10/27/22 at 9 pm.  Mailed packet to the patient.

## 2022-08-18 DIAGNOSIS — Z79899 Other long term (current) drug therapy: Secondary | ICD-10-CM | POA: Diagnosis not present

## 2022-08-18 DIAGNOSIS — R7303 Prediabetes: Secondary | ICD-10-CM | POA: Diagnosis not present

## 2022-08-18 DIAGNOSIS — E785 Hyperlipidemia, unspecified: Secondary | ICD-10-CM | POA: Diagnosis not present

## 2022-08-19 LAB — LIPID PANEL
Chol/HDL Ratio: 4.9 ratio — ABNORMAL HIGH (ref 0.0–4.4)
Cholesterol, Total: 317 mg/dL — ABNORMAL HIGH (ref 100–199)
HDL: 65 mg/dL (ref >39)
LDL Chol Calc (NIH): 235 mg/dL — ABNORMAL HIGH (ref 0–99)
Triglycerides: 99 mg/dL (ref 0–149)
VLDL Cholesterol Cal: 17 mg/dL (ref 5–40)

## 2022-08-19 LAB — BASIC METABOLIC PANEL
BUN/Creatinine Ratio: 22 (ref 12–28)
BUN: 16 mg/dL (ref 8–27)
CO2: 24 mmol/L (ref 20–29)
Calcium: 9.6 mg/dL (ref 8.7–10.3)
Chloride: 103 mmol/L (ref 96–106)
Creatinine, Ser: 0.73 mg/dL (ref 0.57–1.00)
Glucose: 101 mg/dL — ABNORMAL HIGH (ref 70–99)
Potassium: 4.4 mmol/L (ref 3.5–5.2)
Sodium: 141 mmol/L (ref 134–144)
eGFR: 83 mL/min/{1.73_m2} (ref >59)

## 2022-08-19 LAB — HEMOGLOBIN A1C
Est. average glucose Bld gHb Est-mCnc: 123 mg/dL
Hgb A1c MFr Bld: 5.9 % — ABNORMAL HIGH (ref 4.8–5.6)

## 2022-08-26 ENCOUNTER — Ambulatory Visit: Payer: No Typology Code available for payment source | Admitting: Family Medicine

## 2022-08-26 VITALS — BP 138/68 | HR 78 | Ht 63.0 in | Wt 183.4 lb

## 2022-08-26 DIAGNOSIS — M7918 Myalgia, other site: Secondary | ICD-10-CM | POA: Diagnosis not present

## 2022-08-26 DIAGNOSIS — R7303 Prediabetes: Secondary | ICD-10-CM

## 2022-08-26 DIAGNOSIS — E785 Hyperlipidemia, unspecified: Secondary | ICD-10-CM | POA: Diagnosis not present

## 2022-08-26 MED ORDER — ROSUVASTATIN CALCIUM 5 MG PO TABS
5.0000 mg | ORAL_TABLET | Freq: Every day | ORAL | 1 refills | Status: AC
Start: 2022-08-26 — End: ?

## 2022-08-26 NOTE — Progress Notes (Signed)
   Subjective:    [name], female    DOB: 18-Sep-1940, 82 y.o.   MRN: [id number]  HPI Patient arrives today for surgical clearance.  She is going to have right total shoulder replacement she states overall she seems to be doing pretty good except for shoulder pain discomfort has a hard time pushing and standing up with the shoulder she relates her breathing is doing fine hearts doing fine she did get cardiac clearance  Review of Systems     Objective:   Physical Exam General-in no acute distress Eyes-no discharge Lungs-respiratory rate normal, CTA CV-no murmurs,RRR Extremities skin warm dry no edema Neuro grossly normal Behavior normal, alert  A1c currently 5.9, creatinine looks good, LDL significantly elevated      Assessment & Plan:   Shoulder pain-see orthopedics more than likely need to follow through with surgery She does have a history of fibromyalgia She also has history of hyperlipidemia given that her LDL is now severely elevated she does consent to going ahead with low-dose statin side effects discussed she will let us know if any problems she will repeat labs in approximately 10 to 12 weeks From a medical perspective she is approved for surgery I have encouraged her to follow-up with [place] on a regular basis for other issues.

## 2022-08-27 ENCOUNTER — Other Ambulatory Visit: Payer: Self-pay

## 2022-08-27 DIAGNOSIS — E785 Hyperlipidemia, unspecified: Secondary | ICD-10-CM

## 2022-08-27 DIAGNOSIS — Z79899 Other long term (current) drug therapy: Secondary | ICD-10-CM

## 2022-08-27 DIAGNOSIS — M7918 Myalgia, other site: Secondary | ICD-10-CM

## 2022-08-27 NOTE — Progress Notes (Signed)
Mychart message sent to patient.

## 2022-08-28 NOTE — Progress Notes (Signed)
Patient is aware per dr notes

## 2022-09-02 ENCOUNTER — Encounter (HOSPITAL_COMMUNITY)
Admission: RE | Admit: 2022-09-02 | Discharge: 2022-09-02 | Disposition: A | Discharge status: Home / Self Care | Payer: No Typology Code available for payment source | Source: Ambulatory Visit | Attending: Gastroenterology | Admitting: Gastroenterology

## 2022-09-02 ENCOUNTER — Encounter (HOSPITAL_COMMUNITY): Payer: Self-pay

## 2022-09-02 HISTORY — DX: Other complications of anesthesia, initial encounter: T88.59XA

## 2022-09-02 HISTORY — DX: Laryngeal spasm: J38.5

## 2022-09-02 NOTE — Pre-Procedure Instructions (Signed)
 Attempted pre-op phonecall. Left VM for her to call us back. 

## 2022-09-03 ENCOUNTER — Encounter (HOSPITAL_COMMUNITY): Payer: Self-pay | Admitting: Anesthesiology

## 2022-09-04 ENCOUNTER — Ambulatory Visit (HOSPITAL_COMMUNITY)
Admission: RE | Admit: 2022-09-04 | Payer: No Typology Code available for payment source | Source: Home / Self Care | Admitting: Gastroenterology

## 2022-09-04 ENCOUNTER — Encounter (HOSPITAL_COMMUNITY): Admission: RE | Payer: Self-pay | Source: Home / Self Care

## 2022-09-04 DIAGNOSIS — R110 Nausea: Secondary | ICD-10-CM

## 2022-09-04 DIAGNOSIS — R1013 Epigastric pain: Secondary | ICD-10-CM

## 2022-09-04 SURGERY — ESOPHAGOGASTRODUODENOSCOPY (EGD) WITH PROPOFOL
Anesthesia: Monitor Anesthesia Care

## 2022-09-07 ENCOUNTER — Ambulatory Visit: Payer: No Typology Code available for payment source | Admitting: Family Medicine

## 2022-09-08 ENCOUNTER — Telehealth (INDEPENDENT_AMBULATORY_CARE_PROVIDER_SITE_OTHER): Payer: Self-pay | Admitting: Gastroenterology

## 2022-09-08 NOTE — Telephone Encounter (Signed)
Contacted pt to reschedule her from Friday 09/04/22 for EGD. Pt is having shoulder surgery on 10/01/22. Is there a certain time frame that we need to wait. Gave pt option of 09/25/22 but pt states that is her pre op for shoulder surgery. Pt prefers Friday due to having transportation that day. Please advise. Thank you

## 2022-09-08 NOTE — Telephone Encounter (Signed)
No specific timeframe Thanks

## 2022-09-10 NOTE — Telephone Encounter (Signed)
Pt left voicemail checking on message. Returned call to patient. Pt would like morning (8am-9:30am) on Friday if possible. But can do other days if necessary. Went through schedule with pt and we have afternoons only. Pt is ASA 3 and was originally scheduled for 09/04/22. Pt is having shoulder surgery on 10/01/22 and wanted to have EGD done prior to that. Advised pt we could keep a check on the schedule and call with any cancellations. Pt verbalized understanding.

## 2022-09-18 ENCOUNTER — Other Ambulatory Visit (HOSPITAL_COMMUNITY): Payer: Self-pay

## 2022-09-21 NOTE — Progress Notes (Deleted)
Office Visit Note  Patient: Ruth [name]             Date of Birth: 08-Aug-1940           MRN: [id number]             PCP: Babs Sciara, MD Referring: Babs Sciara, MD Visit Date: 09/29/2022 Occupation: @GUAROCC @  Subjective:  No chief complaint on file.   History of Present Illness: Ruth [name] is a 82 y.o. female ***     Activities of Daily Living:  Patient reports morning stiffness for *** {minute/hour:19697}.   Patient {ACTIONS;DENIES/REPORTS:21021675::"Denies"} nocturnal pain.  Difficulty dressing/grooming: {ACTIONS;DENIES/REPORTS:21021675::"Denies"} Difficulty climbing stairs: {ACTIONS;DENIES/REPORTS:21021675::"Denies"} Difficulty getting out of chair: {ACTIONS;DENIES/REPORTS:21021675::"Denies"} Difficulty using hands for taps, buttons, cutlery, and/or writing: {ACTIONS;DENIES/REPORTS:21021675::"Denies"}  No Rheumatology ROS completed.   PMFS History:  Patient Active Problem List   Diagnosis Date Noted   Nausea without vomiting 07/27/2022   Pain in joint of right shoulder 07/09/2020   Body mass index (BMI) 35.0-35.9, adult 06/06/2019   Cervical spondylosis with radiculopathy 06/06/2019   MDD (major depressive disorder), recurrent, in partial remission (HCC) 03/27/2019   Depression, major, single episode, mild (HCC) 03/07/2018   History of total knee replacement, left 02/21/2018   OA (osteoarthritis) of knee 02/07/2018   Non-allergic rhinitis 12/16/2017   Allergy to metal 12/16/2017   Pain in right foot 09/21/2017   Primary osteoarthritis of both feet 04/29/2017   Osteoarthritis of foot joint 04/29/2017   Hammer toe 04/23/2017   Family history of premature coronary heart disease 04/15/2016   Primary osteoarthritis of both knees 12/31/2015   Vulvodynia 08/03/2014   Anxiety disorder 07/06/2014   Fibromyalgia 04/25/2014   Prediabetes 03/18/2014   Hyperlipidemia 03/18/2014   Osteopenia 03/06/2014   History of cardiac catheterization 04/28/2013    Abdominal pain, epigastric 02/02/2012   GERD (gastroesophageal reflux disease) 02/02/2012   Indigestion 02/02/2012   Esophageal dysphagia 02/05/2011   CHEST PAIN 02/11/2009   IRRITABLE BOWEL SYNDROME 02/15/2007   Primary osteoarthritis of both hands 02/15/2007   Degenerative joint disease of hand 02/15/2007   GASTRITIS 02/14/2007    Past Medical History:  Diagnosis Date   Anemia    Anxiety    Arthritis    Asthma    Complication of anesthesia    laryngospasms   Depression    Eczema    Fatty liver    Fibromyalgia    GERD (gastroesophageal reflux disease)    EGD/ colon 1/09   H pylori ulcer 1980-1990   s/p treatment   History of kidney stones    Hyperlipidemia    IBS (irritable bowel syndrome)    Kidney cysts    Laryngospasm    Laryngospasms    with anesthesia   Neck pain    OSA (obstructive sleep apnea)    Pneumonia 2013   PONV (postoperative nausea and vomiting)    Pre-diabetes    Refusal of blood transfusions as patient is Jehovah's Witness     Family History  Problem Relation Age of Onset   Paranoid behavior Mother        depression   Colon polyps Mother    Eczema Mother    Depression Mother    Diabetes Father    Heart attack Father        1's   Lung cancer Father    Cirrhosis Father 38       etoh cirrhosis   Eczema Father    Lung cancer Sister  Depression Sister    Alzheimer's disease Sister    Hypertension Sister    Diabetes Sister    Lung cancer Sister    Sleep apnea Sister    Depression Maternal Aunt    COPD Daughter    Drug abuse Niece    Suicidality Niece    Allergic rhinitis Neg Hx    Asthma Neg Hx    Urticaria Neg Hx    Past Surgical History:  Procedure Laterality Date   BILATERAL SALPINGOOPHORECTOMY  1990s   abdominal   BIOPSY  09/11/2020   Procedure: BIOPSY;  Surgeon: Malissa Hippo, MD;  [facility];  Service: Endoscopy;;  gastric   CARDIAC CATHETERIZATION  2002   [facility]) normal coronary arteries    COLONOSCOPY   01/2007   Dr. Laurie Panda   COLONOSCOPY N/A 04/18/2015   Procedure: COLONOSCOPY;  Surgeon: Malissa Hippo, MD;  [facility];  Service: Endoscopy;  Laterality: N/A;  830 - moved to 8:55 - Ann to notify pt   COLONOSCOPY WITH ESOPHAGOGASTRODUODENOSCOPY (EGD)  02/16/2012   Procedure: COLONOSCOPY WITH ESOPHAGOGASTRODUODENOSCOPY (EGD);  Surgeon: Corbin Ade, MD;  [facility];  Service: Endoscopy;  Laterality: N/A;  8:45   COLONOSCOPY WITH PROPOFOL N/A 09/11/2020   Procedure: COLONOSCOPY WITH PROPOFOL;  Surgeon: Malissa Hippo, MD;  [facility];  Service: Endoscopy;  Laterality: N/A;  10:55   ESOPHAGEAL DILATION N/A 06/07/2014   Procedure: ESOPHAGEAL DILATION;  Surgeon: Malissa Hippo, MD;  [facility];  Service: Endoscopy;  Laterality: N/A;   ESOPHAGEAL DILATION N/A 09/11/2020   Procedure: ESOPHAGEAL DILATION;  Surgeon: Malissa Hippo, MD;  [facility];  Service: Endoscopy;  Laterality: N/A;   ESOPHAGOGASTRODUODENOSCOPY  01/2007   Dr Jarold Motto- 3 cm hiatal hernia, benign esophageal biopsies, erosive esophagitis, gastritis   ESOPHAGOGASTRODUODENOSCOPY N/A 06/07/2014   Procedure: ESOPHAGOGASTRODUODENOSCOPY (EGD);  Surgeon: Malissa Hippo, MD;  [facility];  Service: Endoscopy;  Laterality: N/A;  1200   ESOPHAGOGASTRODUODENOSCOPY N/A 08/18/2017   Procedure: ESOPHAGOGASTRODUODENOSCOPY (EGD);  Surgeon: Malissa Hippo, MD;  [facility];  Service: Endoscopy;  Laterality: N/A;  2:20   ESOPHAGOGASTRODUODENOSCOPY (EGD) WITH PROPOFOL N/A 09/11/2020   Procedure: ESOPHAGOGASTRODUODENOSCOPY (EGD) WITH PROPOFOL;  Surgeon: Malissa Hippo, MD;  [facility];  Service: Endoscopy;  Laterality: N/A;   FOOT SURGERY Left 2005   hammer toe   INNER EAR SURGERY     Left   KNEE SURGERY Left    x 2   POLYPECTOMY  09/11/2020   Procedure: POLYPECTOMY;  Surgeon: Malissa Hippo, MD;  [facility];   Service: Endoscopy;;   SHOULDER ARTHROSCOPY WITH ROTATOR CUFF REPAIR Right 05/07/2021   Procedure: SHOULDER ARTHROSCOPY WITH MANIPULATION UNDER ANESTHESIA ,SUBACROMIAL DECOMPRESSION, DISTAL CLAVICLE RESECTION, ROTATOR CUFF REPAIR , BICEPS TENOTOMY;  Surgeon: Eugenia Mcalpine, MD;  [facility]: [facility];  Service: Orthopedics;  Laterality: Right;  WITH INTERSCALENE BLOCK NEEDS PRE OP CONSULT  WITH ANESTHESIA 120   SHOULDER SURGERY Left 2011   TONSILLECTOMY     age 79   TOTAL KNEE ARTHROPLASTY Left 02/07/2018   Procedure: TOTAL KNEE ARTHROPLASTY;  Surgeon: Ollen Gross, MD;  [facility];  Service: Orthopedics;  Laterality: Left;    TUBAL LIGATION  1970   with an appy   TYMPANOSTOMY TUBE PLACEMENT  1984   VAGINAL HYSTERECTOMY  1970   Social History   Social History Narrative   Married 2 grown  children, 1 adopted son-autistic-lives next door.   1 granddaughter   2 step grandsons   Immunization History  Administered Date(s) Administered   Influenza,inj,Quad PF,6+ Mos 12/15/2017, 11/15/2018   Influenza,inj,quad, With Preservative 11/16/2016   Influenza-Unspecified 11/22/2015, 11/01/2019, 11/30/2020, 11/26/2021   Moderna Sars-Covid-2 Vaccination 03/24/2019, 04/22/2019, 11/18/2019, 06/02/2020   Pneumococcal Conjugate-13 02/15/2014   Pneumococcal Polysaccharide-23 01/25/2020   Zoster Recombinant(Shingrix) 07/10/2016     Objective: Vital Signs: There were no vitals taken for this visit.   Physical Exam   Musculoskeletal Exam: ***  CDAI Exam: CDAI Score: -- Patient Global: --; Provider Global: -- Swollen: --; Tender: -- Joint Exam 09/29/2022   No joint exam has been documented for this visit   There is currently no information documented on the homunculus. Go to the Rheumatology activity and complete the homunculus joint exam.  Investigation: No additional findings.  Imaging: No results found.  Recent Labs: Lab Results  Component Value Date   WBC 6.7 01/15/2022    HGB 13.6 01/15/2022   PLT 249 01/15/2022   NA 141 08/18/2022   K 4.4 08/18/2022   CL 103 08/18/2022   CO2 24 08/18/2022   GLUCOSE 101 (H) 08/18/2022   BUN 16 08/18/2022   CREATININE 0.73 08/18/2022   BILITOT 0.3 01/15/2022   ALKPHOS 67 01/15/2022   AST 17 01/15/2022   ALT 13 01/15/2022   PROT 6.7 01/15/2022   ALBUMIN 4.5 01/15/2022   CALCIUM 9.6 08/18/2022   GFRAA 84 07/07/2018    Speciality Comments: No specialty comments available.  Procedures:  No procedures performed Allergies: Blood-group specific substance, Ciprofloxacin, Codeine, Latex, Prozac [fluoxetine], Sertraline hcl, Vicodin [hydrocodone-acetaminophen], and Tramadol   Assessment / Plan:     Visit Diagnoses: No diagnosis found.  Orders: No orders of the defined types were placed in this encounter.  No orders of the defined types were placed in this encounter.   Face-to-face time spent with patient was *** minutes. Greater than 50% of time was spent in counseling and coordination of care.  Follow-Up Instructions: No follow-ups on file.   Ellen Henri, CMA  Note - This record has been created using Animal nutritionist.  Chart creation errors have been sought, but may not always  have been located. Such creation errors do not reflect on  the standard of medical care.

## 2022-09-22 NOTE — Progress Notes (Addendum)
Anesthesia Review:  PCP: Lilyan Punt- LOV 7/10;24 on chart  Cardiologist : Charlton Haws- LOV 10/01/21 preop clearance- 08/13/22- Robin Searing, NP on chart  Chest x-ray : EKG : 10/01/21  Echo : 08/11/21  CT Card Scoring- 10/16/21  Stress test: Cardiac Cath :  Activity level:  Sleep Study/ CPAP : Fasting Blood Sugar :      / Checks Blood Sugar -- times a day:   Blood Thinner/ Instructions /Last Dose: ASA / Instructions/ Last Dose :    Prediabetes  08/18/22- hgba1c- 5.9    Preop appt done via phone on 09/25/2022 due to pt having issues with IBS on preop appt day.  Med hx and instructions completed via phone  PT to come on 09/28/22 at 100pm for lab appt and to pick up instructons along with hibiclens and G2 and Benzoyl Peroxide Gel.

## 2022-09-25 ENCOUNTER — Encounter (HOSPITAL_COMMUNITY): Payer: Self-pay | Admitting: Orthopedic Surgery

## 2022-09-25 ENCOUNTER — Other Ambulatory Visit: Payer: Self-pay

## 2022-09-25 ENCOUNTER — Encounter (HOSPITAL_COMMUNITY): Payer: No Typology Code available for payment source | Attending: Orthopedic Surgery

## 2022-09-25 NOTE — Progress Notes (Signed)
SURGICAL WAITING ROOM VISITATION  Patients having surgery or a procedure may have no more than 2 support people in the waiting area - these visitors may rotate.    Children under the age of 61 must have an adult with them who is not the patient.  Due to an increase in RSV and influenza rates and associated hospitalizations, children ages 18 and under may not visit patients in [facility] hospitals.  If the patient needs to stay at the hospital during part of their recovery, the visitor guidelines for inpatient rooms apply. Pre-op nurse will coordinate an appropriate time for 1 support person to accompany patient in pre-op.  This support person may not rotate.    Please refer to the [facility] website for the visitor guidelines for Inpatients (after your surgery is over and you are in a regular room).       Your procedure is scheduled on:  10/01/2022    Report to [facility] Main Entrance    Report to admitting at  0515 AM   Call this number if you have problems the morning of surgery [phone]   Do not eat food :After Midnight.   After Midnight you may have the following liquids until __ 0430____ AM/  DAY OF SURGERY  Water Non-Citrus Juices (without pulp, NO RED-Apple, White grape, White cranberry) Black Coffee (NO MILK/CREAM OR CREAMERS, sugar ok)  Clear Tea (NO MILK/CREAM OR CREAMERS, sugar ok) regular and decaf                             Plain Jell-O (NO RED)                                           Fruit ices (not with fruit pulp, NO RED)                                     Popsicles (NO RED)                                                               Sports drinks like Gatorade (NO RED)                 The day of surgery:  Drink ONE (1) Pre-Surgery Clear Ensure or G2 at  0430 AM ( have completged by )  the morning of surgery. Drink in one sitting. Do not sip.  This drink was given to you during your hospital  pre-op appointment visit. Nothing else to  drink after completing the  Pre-Surgery Clear Ensure or G2.          If you have questions, please contact your surgeon's office.      Oral Hygiene is also important to reduce your risk of infection.                                    Remember - BRUSH YOUR TEETH THE MORNING OF SURGERY WITH YOUR REGULAR TOOTHPASTE  DENTURES WILL BE REMOVED PRIOR TO SURGERY PLEASE DO NOT APPLY "Poly grip" OR ADHESIVES!!!   Do NOT smoke after Midnight   Stop all vitamins and herbal supplements 7 days before surgery.   Take these medicines the morning of surgery with A SIP OF WATER:  inhalers as usual and bring, prozac   DO NOT TAKE ANY ORAL DIABETIC MEDICATIONS DAY OF YOUR SURGERY  Bring CPAP mask and tubing day of surgery.                              You may not have any metal on your body including hair pins, jewelry, and body piercing             Do not wear make-up, lotions, powders, perfumes/cologne, or deodorant  Do not wear nail polish including gel and S&S, artificial/acrylic nails, or any other type of covering on natural nails including finger and toenails. If you have artificial nails, gel coating, etc. that needs to be removed by a nail salon please have this removed prior to surgery or surgery may need to be canceled/ delayed if the surgeon/ anesthesia feels like they are unable to be safely monitored.   Do not shave  48 hours prior to surgery.               Men may shave face and neck.   Do not bring valuables to the hospital. [facility] IS NOT             RESPONSIBLE   FOR VALUABLES.   Contacts, glasses, dentures or bridgework may not be worn into surgery.   Bring small overnight bag day of surgery.   DO NOT BRING YOUR HOME MEDICATIONS TO THE HOSPITAL. PHARMACY WILL DISPENSE MEDICATIONS LISTED ON YOUR MEDICATION LIST TO YOU DURING YOUR ADMISSION IN THE HOSPITAL!    Patients discharged on the day of surgery will not be allowed to drive home.  Someone NEEDS to stay with you for  the first 24 hours after anesthesia.   Special Instructions: Bring a copy of your healthcare power of attorney and living will documents the day of surgery if you haven't scanned them before.              Please read over the following fact sheets you were given: IF YOU HAVE QUESTIONS ABOUT YOUR PRE-OP INSTRUCTIONS PLEASE CALL [phone]   If you received a COVID test during your pre-op visit  it is requested that you wear a mask when out in public, stay away from anyone that may not be feeling well and notify your surgeon if you develop symptoms. If you test positive for Covid or have been in contact with anyone that has tested positive in the last 10 days please notify you surgeon.      Pre-operative 5 CHG Bath Instructions   You can play a key role in reducing the risk of infection after surgery. Your skin needs to be as free of germs as possible. You can reduce the number of germs on your skin by washing with CHG (chlorhexidine gluconate) soap before surgery. CHG is an antiseptic soap that kills germs and continues to kill germs even after washing.   DO NOT use if you have an allergy to chlorhexidine/CHG or antibacterial soaps. If your skin becomes reddened or irritated, stop using the CHG and notify one of our RNs at [phone].   Please shower with the CHG soap  starting 4 days before surgery using the following schedule:     Please keep in mind the following:  DO NOT shave, including legs and underarms, starting the day of your first shower.   You may shave your face at any point before/day of surgery.  Place clean sheets on your bed the day you start using CHG soap. Use a clean washcloth (not used since being washed) for each shower. DO NOT sleep with pets once you start using the CHG.   CHG Shower Instructions:  If you choose to wash your hair and private area, wash first with your normal shampoo/soap.  After you use shampoo/soap, rinse your hair and body thoroughly to  remove shampoo/soap residue.  Turn the water OFF and apply about 3 tablespoons (45 ml) of CHG soap to a CLEAN washcloth.  Apply CHG soap ONLY FROM YOUR NECK DOWN TO YOUR TOES (washing for 3-5 minutes)  DO NOT use CHG soap on face, private areas, open wounds, or sores.  Pay special attention to the area where your surgery is being performed.  If you are having back surgery, having someone wash your back for you may be helpful. Wait 2 minutes after CHG soap is applied, then you may rinse off the CHG soap.  Pat dry with a clean towel  Put on clean clothes/pajamas   If you choose to wear lotion, please use ONLY the CHG-compatible lotions on the back of this paper.     Additional instructions for the day of surgery: DO NOT APPLY any lotions, deodorants, cologne, or perfumes.   Put on clean/comfortable clothes.  Brush your teeth.  Ask your nurse before applying any prescription medications to the skin.      CHG Compatible Lotions   Aveeno Moisturizing lotion  Cetaphil Moisturizing Cream  Cetaphil Moisturizing Lotion  Clairol Herbal Essence Moisturizing Lotion, Dry Skin  Clairol Herbal Essence Moisturizing Lotion, Extra Dry Skin  Clairol Herbal Essence Moisturizing Lotion, Normal Skin  Curel Age Defying Therapeutic Moisturizing Lotion with Alpha Hydroxy  Curel Extreme Care Body Lotion  Curel Soothing Hands Moisturizing Hand Lotion  Curel Therapeutic Moisturizing Cream, Fragrance-Free  Curel Therapeutic Moisturizing Lotion, Fragrance-Free  Curel Therapeutic Moisturizing Lotion, Original Formula  Eucerin Daily Replenishing Lotion  Eucerin Dry Skin Therapy Plus Alpha Hydroxy Crme  Eucerin Dry Skin Therapy Plus Alpha Hydroxy Lotion  Eucerin Original Crme  Eucerin Original Lotion  Eucerin Plus Crme Eucerin Plus Lotion  Eucerin TriLipid Replenishing Lotion  Keri Anti-Bacterial Hand Lotion  Keri Deep Conditioning Original Lotion Dry Skin Formula Softly Scented  Keri Deep Conditioning  Original Lotion, Fragrance Free Sensitive Skin Formula  Keri Lotion Fast Absorbing Fragrance Free Sensitive Skin Formula  Keri Lotion Fast Absorbing Softly Scented Dry Skin Formula  Keri Original Lotion  Keri Skin Renewal Lotion Keri Silky Smooth Lotion  Keri Silky Smooth Sensitive Skin Lotion  Nivea Body Creamy Conditioning Oil  Nivea Body Extra Enriched Lotion  Nivea Body Original Lotion  Nivea Body Sheer Moisturizing Lotion Nivea Crme  Nivea Skin Firming Lotion  NutraDerm 30 Skin Lotion  NutraDerm Skin Lotion  NutraDerm Therapeutic Skin Cream  NutraDerm Therapeutic Skin Lotion  ProShield Protective Hand Cream  Provon moisturizing lotion   [place]- Preparing for Total Shoulder Arthroplasty    Before surgery, you can play an important role. Because skin is not sterile, your skin needs to be as free of germs as possible. You can reduce the number of germs on your skin by using the following products. Benzoyl  Peroxide Gel Reduces the number of germs present on the skin Applied twice a day to shoulder area starting two days before surgery    ==================================================================  Please follow these instructions carefully:  BENZOYL PEROXIDE 5% GEL  Please do not use if you have an allergy to benzoyl peroxide.   If your skin becomes reddened/irritated stop using the benzoyl peroxide.  Starting two days before surgery, apply as follows: Apply benzoyl peroxide in the morning and at night. Apply after taking a shower. If you are not taking a shower clean entire shoulder front, back, and side along with the armpit with a clean wet washcloth.  Place a quarter-sized dollop on your shoulder and rub in thoroughly, making sure to cover the front, back, and side of your shoulder, along with the armpit.   2 days before ____ AM   ____ PM              1 day before ____ AM   ____ PM                         Do this twice a day for two days.  (Last application  is the night before surgery, AFTER using the CHG soap as described below).  Do NOT apply benzoyl peroxide gel on the day of surgery.

## 2022-09-28 ENCOUNTER — Encounter (HOSPITAL_COMMUNITY): Admission: RE | Admit: 2022-09-28 | Payer: No Typology Code available for payment source | Source: Ambulatory Visit

## 2022-09-29 ENCOUNTER — Ambulatory Visit: Payer: No Typology Code available for payment source | Admitting: Rheumatology

## 2022-09-29 DIAGNOSIS — M2041 Other hammer toe(s) (acquired), right foot: Secondary | ICD-10-CM

## 2022-09-29 DIAGNOSIS — Q6672 Congenital pes cavus, left foot: Secondary | ICD-10-CM

## 2022-09-29 DIAGNOSIS — G252 Other specified forms of tremor: Secondary | ICD-10-CM

## 2022-09-29 DIAGNOSIS — F5101 Primary insomnia: Secondary | ICD-10-CM

## 2022-09-29 DIAGNOSIS — Z96652 Presence of left artificial knee joint: Secondary | ICD-10-CM

## 2022-09-29 DIAGNOSIS — M8589 Other specified disorders of bone density and structure, multiple sites: Secondary | ICD-10-CM

## 2022-09-29 DIAGNOSIS — M797 Fibromyalgia: Secondary | ICD-10-CM

## 2022-09-29 DIAGNOSIS — M19071 Primary osteoarthritis, right ankle and foot: Secondary | ICD-10-CM

## 2022-09-29 DIAGNOSIS — M1711 Unilateral primary osteoarthritis, right knee: Secondary | ICD-10-CM

## 2022-09-29 DIAGNOSIS — M19011 Primary osteoarthritis, right shoulder: Secondary | ICD-10-CM

## 2022-09-29 DIAGNOSIS — R5383 Other fatigue: Secondary | ICD-10-CM

## 2022-09-29 DIAGNOSIS — M19042 Primary osteoarthritis, left hand: Secondary | ICD-10-CM

## 2022-10-01 ENCOUNTER — Ambulatory Visit (HOSPITAL_COMMUNITY)
Admission: RE | Admit: 2022-10-01 | Payer: No Typology Code available for payment source | Source: Home / Self Care | Admitting: Orthopedic Surgery

## 2022-10-01 ENCOUNTER — Encounter (HOSPITAL_COMMUNITY): Admission: RE | Payer: Self-pay | Source: Home / Self Care

## 2022-10-01 SURGERY — ARTHROPLASTY, SHOULDER, TOTAL, REVERSE
Anesthesia: General | Site: Shoulder | Laterality: Right

## 2022-10-06 NOTE — Telephone Encounter (Signed)
Pt left voicemail in regards to scheduling EGD. Pt did not have shoulder surgery as planned. Pt has been scheduled for 10/16/22 at 7:30 with Dr.Castaneda. instructions sent via my chart and by mail.

## 2022-10-07 ENCOUNTER — Telehealth: Payer: Self-pay | Admitting: Cardiovascular Disease

## 2022-10-07 NOTE — Telephone Encounter (Signed)
Pt c/o of Chest Pain: STAT if CP now or developed within 24 hours  1. Are you having CP right now? No, but when it does happen its pain in her chest and in both arms   2. Are you experiencing any other symptoms (ex. SOB, nausea, vomiting, sweating)? No  3. How long have you been experiencing CP? Since last week  4. Is your CP continuous or coming and going? Coming and going   5. Have you taken Nitroglycerin? No ?
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
## 2022-10-08 ENCOUNTER — Encounter: Payer: Self-pay | Admitting: Cardiovascular Disease

## 2022-10-08 ENCOUNTER — Ambulatory Visit
Payer: No Typology Code available for payment source | Attending: Cardiovascular Disease | Admitting: Cardiovascular Disease

## 2022-10-08 VITALS — BP 124/72 | HR 73 | Ht 64.0 in | Wt 185.8 lb

## 2022-10-08 DIAGNOSIS — R072 Precordial pain: Secondary | ICD-10-CM

## 2022-10-08 NOTE — Patient Instructions (Addendum)
Medication Instructions:  Your physician recommends that you continue on your current medications as directed. Please refer to the Current Medication list given to you today.  *If you need a refill on your cardiac medications before your next appointment, please call your pharmacy*  Lab Work: If you have labs (blood work) drawn today and your tests are completely normal, you will receive your results only by: MyChart Message (if you have MyChart) OR A paper copy in the mail If you have any lab test that is abnormal or we need to change your treatment, we will call you to review the results.   Testing/Procedures: Cardiac PET CT scanning, (CAT scanning), is a noninvasive, special x-ray that produces cross-sectional images of the body using x-rays and a computer. CT scans help physicians diagnose and treat medical conditions. For some CT exams, a contrast material is used to enhance visibility in the area of the body being studied. CT scans provide greater clarity and reveal more details than regular x-ray exams.  Follow-Up: At [facility], you and your health needs are our priority.  As part of our continuing mission to provide you with exceptional heart care, we have created designated Provider Care Teams.  These Care Teams include your primary Cardiologist (physician) and Advanced Practice Providers (APPs -  Physician Assistants and Nurse Practitioners) who all work together to provide you with the care you need, when you need it.  We recommend signing up for the patient portal called "MyChart".  Sign up information is provided on this After Visit Summary.  MyChart is used to connect with patients for Virtual Visits (Telemedicine).  Patients are able to view lab/test results, encounter notes, upcoming appointments, etc.  Non-urgent messages can be sent to your provider as well.   To learn more about what you can do with MyChart, go to [email or web address].    Your next appointment:    1 year(s)  Provider:   Charlton Haws, MD     Other Instructions How to Prepare for Your Cardiac PET/CT Stress Test:  1. Please do not take these medications before your test:   Medications that may interfere with the cardiac pharmacological stress agent (ex. nitrates - including erectile dysfunction medications, isosorbide mononitrate, tamulosin or beta-blockers) the day of the exam. (Erectile dysfunction medication should be held for at least 72 hrs prior to test) Theophylline containing medications for 12 hours. Dipyridamole 48 hours prior to the test. Your remaining medications may be taken with water.  2. Nothing to eat or drink, except water, 3 hours prior to arrival time.   NO caffeine/decaffeinated products, or chocolate 12 hours prior to arrival.  3. NO perfume, cologne or lotion  4. Total time is 1 to 2 hours; you may want to bring reading material for the waiting time.  5. Please report to Radiology at the [facility] Main Entrance 30 minutes early for your test.  [address] [place], [place] [address]  6. Please report to Radiology at [facility] Main Entrance, medical mall, 30 mins prior to your test.  [address]  [place], [place]  [phone]   IF YOU THINK YOU MAY BE PREGNANT, OR ARE NURSING PLEASE INFORM THE TECHNOLOGIST.  In preparation for your appointment, medication and supplies will be purchased.  Appointment availability is limited, so if you need to cancel or reschedule, please call the Radiology Department at [phone] [facility]) OR [phone] [facility])  24 hours in advance to avoid a  cancellation fee of $100.00  What to Expect After you Arrive:  Once you arrive and check in for your appointment, you will be taken to a preparation room within the Radiology Department.  A technologist or Nurse will obtain your medical history, verify that you are correctly prepped for the exam, and explain the procedure.   Afterwards,  an IV will be started in your arm and electrodes will be placed on your skin for EKG monitoring during the stress portion of the exam. Then you will be escorted to the PET/CT scanner.  There, staff will get you positioned on the scanner and obtain a blood pressure and EKG.  During the exam, you will continue to be connected to the EKG and blood pressure machines.  A small, safe amount of a radioactive tracer will be injected in your IV to obtain a series of pictures of your heart along with an injection of a stress agent.    After your Exam:  It is recommended that you eat a meal and drink a caffeinated beverage to counter act any effects of the stress agent.  Drink plenty of fluids for the remainder of the day and urinate frequently for the first couple of hours after the exam.  Your doctor will inform you of your test results within 7-10 business days.  For more information and frequently asked questions, please visit our website : [email or web address]  For questions about your test or how to prepare for your test, please call: Cardiac Imaging Nurse Navigators Office: [phone] How to Prepare for Your Cardiac PET/CT Stress Test:  1. Please do not take these medications before your test:   Medications that may interfere with the cardiac pharmacological stress agent (ex. nitrates - including erectile dysfunction medications, isosorbide mononitrate, tamulosin or beta-blockers) the day of the exam. (Erectile dysfunction medication should be held for at least 72 hrs prior to test) Theophylline containing medications for 12 hours. Dipyridamole 48 hours prior to the test. Your remaining medications may be taken with water.  2. Nothing to eat or drink, except water, 3 hours prior to arrival time.   NO caffeine/decaffeinated products, or chocolate 12 hours prior to arrival.  3. NO perfume, cologne or lotion  4. Total time is 1 to 2 hours; you may want to bring reading  material for the waiting time.  5. Please report to Radiology at the [facility] Main Entrance 30 minutes early for your test.  [address] [place], [place] [address]  6. Please report to Radiology at [facility] Main Entrance, medical mall, 30 mins prior to your test.  [address]  [place], [place]  [phone]  Diabetic Preparation:  Hold oral medications. You may take NPH and Lantus insulin. Do not take Humalog or Humulin R (Regular Insulin) the day of your test. Check blood sugars prior to leaving the house. If able to eat breakfast prior to 3 hour fasting, you may take all medications, including your insulin, Do not worry if you miss your breakfast dose of insulin - start at your next meal.  IF YOU THINK YOU MAY BE PREGNANT, OR ARE NURSING PLEASE INFORM THE TECHNOLOGIST.  In preparation for your appointment, medication and supplies will be purchased.  Appointment availability is limited, so if you need to cancel or reschedule, please call the Radiology Department at [phone] [facility]) OR [phone] [facility])  24 hours in advance to avoid a cancellation fee of $100.00  What to Expect After  you Arrive:  Once you arrive and check in for your appointment, you will be taken to a preparation room within the Radiology Department.  A technologist or Nurse will obtain your medical history, verify that you are correctly prepped for the exam, and explain the procedure.  Afterwards,  an IV will be started in your arm and electrodes will be placed on your skin for EKG monitoring during the stress portion of the exam. Then you will be escorted to the PET/CT scanner.  There, staff will get you positioned on the scanner and obtain a blood pressure and EKG.  During the exam, you will continue to be connected to the EKG and blood pressure machines.  A small, safe amount of a radioactive tracer will be injected in your IV to obtain a series of pictures  of your heart along with an injection of a stress agent.    After your Exam:  It is recommended that you eat a meal and drink a caffeinated beverage to counter act any effects of the stress agent.  Drink plenty of fluids for the remainder of the day and urinate frequently for the first couple of hours after the exam.  Your doctor will inform you of your test results within 7-10 business days.  For more information and frequently asked questions, please visit our website : [email or web address]  For questions about your test or how to prepare for your test, please call: Cardiac Imaging Nurse Navigators Office: [phone]
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
## 2022-10-14 ENCOUNTER — Encounter (HOSPITAL_COMMUNITY)
Admission: RE | Admit: 2022-10-14 | Discharge: 2022-10-14 | Disposition: A | Discharge status: Home / Self Care | Payer: No Typology Code available for payment source | Source: Ambulatory Visit | Attending: Gastroenterology | Admitting: Gastroenterology

## 2022-10-14 NOTE — Progress Notes (Signed)
Patient was seen by cardiology on 10/08/22 for chest pain, palpitations and dyspnea.  She is to have a PET CT cardiac Perfusion test on 11/04/22. We will need to wait to see what the PET CT shows before we can proceed with this procedure per anesthesiologist. This EGD will need to be rescheduled.

## 2022-10-15 ENCOUNTER — Telehealth (INDEPENDENT_AMBULATORY_CARE_PROVIDER_SITE_OTHER): Payer: Self-pay | Admitting: Gastroenterology

## 2022-10-15 NOTE — Telephone Encounter (Signed)
From: Jethro Bolus, RN  Sent: 10/14/2022   4:34 PM EDT  To: Elinor Dodge, LPN; Elsie Amis, RN; *  Subject: cancellation                                   Patient was seen by cardiology on 10/08/22 for chest pain, palpitations and dyspnea.  She is to have a PET CT cardiac Perfusion test on 11/04/22. We will need to wait to see what the PET CT shows before we can proceed with this procedure per anesthesiologist. This EGD will need to be rescheduled.    Pt contacted and has been rescheduled to 11/11/22 at 10:15. Updated instructions will be sent via my chart.

## 2022-10-19 ENCOUNTER — Other Ambulatory Visit: Payer: Self-pay | Admitting: Family Medicine

## 2022-10-22 NOTE — Telephone Encounter (Signed)
Before refilling this medicine please verify with the patient as she is taking this and how she is taking it then send it back to me thank you

## 2022-10-26 NOTE — Telephone Encounter (Signed)
That would be fine she may have 10 mg Prozac 2 in the morning and 2 in the evening, #120, 3 refills, keep follow-up visit later this fall

## 2022-10-27 ENCOUNTER — Telehealth: Payer: Self-pay | Admitting: Neurology

## 2022-10-27 ENCOUNTER — Ambulatory Visit (INDEPENDENT_AMBULATORY_CARE_PROVIDER_SITE_OTHER): Payer: No Typology Code available for payment source | Admitting: Neurology

## 2022-10-27 DIAGNOSIS — R0683 Snoring: Secondary | ICD-10-CM | POA: Diagnosis not present

## 2022-10-27 DIAGNOSIS — G4720 Circadian rhythm sleep disorder, unspecified type: Secondary | ICD-10-CM

## 2022-10-27 DIAGNOSIS — Z9189 Other specified personal risk factors, not elsewhere classified: Secondary | ICD-10-CM

## 2022-10-27 DIAGNOSIS — R351 Nocturia: Secondary | ICD-10-CM

## 2022-10-27 DIAGNOSIS — Z820 Family history of epilepsy and other diseases of the nervous system: Secondary | ICD-10-CM

## 2022-10-27 DIAGNOSIS — E669 Obesity, unspecified: Secondary | ICD-10-CM

## 2022-10-27 DIAGNOSIS — G4719 Other hypersomnia: Secondary | ICD-10-CM

## 2022-10-27 NOTE — Telephone Encounter (Signed)
Sent mychart

## 2022-10-28 ENCOUNTER — Telehealth: Payer: Self-pay | Admitting: *Deleted

## 2022-10-28 ENCOUNTER — Other Ambulatory Visit (HOSPITAL_COMMUNITY): Payer: Self-pay

## 2022-10-28 NOTE — Telephone Encounter (Signed)
-----   Message from Huston Foley sent at 10/28/2022  1:03 PM EDT ----- Pls advise patient, that her sleep study from last night did not show any significant sleep apnea. She had no significant snoring. At this juncture, she can FU with her PCP.

## 2022-10-28 NOTE — Telephone Encounter (Signed)
Spoke to pt gave sleep study results gave Dr Frances Furbish recommendation. Pt thanked me for calling

## 2022-10-28 NOTE — Procedures (Signed)
Physician Interpretation:     [facility] POLYSOMNOGRAPHY  INTERPRETATION REPORT   STUDY DATE:  10/27/2022     PATIENT NAME:  Ruth [name]         DATE OF BIRTH:  01-14-1941  PATIENT ID:  [id number]    TYPE OF STUDY:  PSG  READING PHYSICIAN: Huston Foley, MD, PhD   SCORING TECHNICIAN: Margaretann Loveless, RPSGT   Referred by: Babs Sciara, MD  ? History and Indication for Testing: 82 year old female with an underlying complex medical history of anemia, arthritis, asthma, eczema, depression, anxiety, fibromyalgia, reflux disease, history of kidney stones, hyperlipidemia, irritable bowel syndrome, prediabetes, and obesity, who reports snoring and excessive daytime somnolence and significant nocturia. Her Epworth sleepiness score is 8 out of 24, fatigue severity score is 63/63.  Height: 63 in Weight: 184 lb (BMI 32) Neck Size: 0 in    MEDICATIONS: Tylenol, Risaquad, Ventolin, Vitamin C, Bentyl, Pepcid, Prozac, Cutivate, Nizoral, Lidocaine-Prilocaine, Magnesium, Zinc, Nitrostat, Zofran, Polyvinyl Alcohol, Estrace, Oxycodone   TECHNICAL DESCRIPTION: A registered sleep technologist was in attendance for the duration of the recording.  Data collection, scoring, video monitoring, and reporting were performed in compliance with the AASM Manual for the Scoring of Sleep and Associated Events; (Hypopnea is scored based on the criteria listed in Section VIII D. 1b in the AASM Manual V2.6 using a 4% oxygen desaturation rule or Hypopnea is scored based on the criteria listed in Section VIII D. 1a in the AASM Manual V2.6 using 3% oxygen desaturation and /or arousal rule).   SLEEP CONTINUITY AND SLEEP ARCHITECTURE:  Of note, the patient requested that her husband stay for the sleep study. She insisted on sleeping in the recliner and declined sleeping in the adjustable bed.  Lights-out was at 22:12: and lights-on at  04:58:, with  6.8 minutes of recording time . Total sleep time  ( TST) was 237.5 minutes with a decreased sleep efficiency at 58.4%. There was  8.8% REM sleep.   BODY POSITION:  TST was divided  between the following sleep positions: 100.0% supine;  0.0% lateral;  0% prone. Duration of total sleep and percent of total sleep in their respective position is as follows: supine 237 minutes (100%), non-supine 0 minutes (0%); right 00 minutes (0%), left 00 minutes (0%), and prone 00 minutes (0%).  Total supine REM sleep time was 21 minutes (100% of total REM sleep).  Sleep latency was increased at 111.5 minutes.  REM sleep latency was increased at 216.0 minutes. Of the total sleep time, the percentage of stage N1 sleep was 4.8%, stage N2 sleep was 48%, stage N3 sleep was 38.5%, which is increased, and REM sleep was 8.8%, which is reduced. Wake after sleep onset (WASO) time accounted for 57 minutes with sleep fragmentation ranging from minimal to moderate.   RESPIRATORY MONITORING:  Based on CMS criteria (using a 4% oxygen desaturation rule for scoring hypopneas), there were 1 apneas (0 obstructive; 1 central; 0 mixed), and 3 hypopneas.  Apnea index was 0.3. Hypopnea index was 0.8. The apnea-hypopnea index was 1.0/hour overall (1.0 supine, 0 non-supine; 5.7 REM, 5.7 supine REM).  There were 0 respiratory effort-related arousals (RERAs).  The RERA index was 0 events/h. Total respiratory disturbance index (RDI) was 1.0 events/h. RDI results showed: supine RDI  1.0 /h; non-supine RDI 0.0 /h; REM RDI 5.7 /h, supine REM RDI 5.7 /h.   Based on AASM criteria (using a 3% oxygen desaturation and /or arousal rule for scoring  hypopneas), there were 1 apneas (0 obstructive; 1 central; 0 mixed), and 11 hypopneas. Apnea index was 0.3. Hypopnea index was 2.8. The apnea-hypopnea index was 3.0 overall (3.0 supine, 0 non-supine; 5.7 REM, 5.7 supine REM).  There were 0 respiratory effort-related arousals (RERAs).  The RERA index was 0 events/h. Total respiratory disturbance index (RDI) was  3.0 events/h. RDI results showed: supine RDI  3.0 /h; non-supine RDI 0.0 /h; REM RDI 5.7 /h, supine REM RDI 5.7 /h.    OXIMETRY: Oxyhemoglobin Saturation Nadir during sleep was at  86% from a mean of 93%.  Of the Total sleep time (TST)   hypoxemia (=<88%) was present for  0.1 minutes, or 0.1% of total sleep time.   LIMB MOVEMENTS: There were 0 periodic limb movements of sleep (0.0/hr), of which 0 (0.0/hr) were associated with an arousal.   AROUSAL: There were 18 arousals in total, for an arousal index of 5 arousals/hour.  Of these, 3 were identified as respiratory-related arousals (1 /h), 0 were PLM-related arousals (0 /h), and 18 were non-specific arousals (5 /h).   EEG: Review of the EEG showed no abnormal electrical discharges and symmetrical bihemispheric findings.    EKG: The EKG revealed normal sinus rhythm (NSR). The average heart rate during sleep was 63 bpm.   AUDIO/VIDEO REVIEW: The audio and video review did not show any abnormal or unusual behaviors, movements, phonations or vocalizations. The patient took one restroom breaks. No significant snoring noted from the patient. Snoring was was audible from her husband, who slept in the bed next to the patient. His movements resulted at times in her awakenings. She slept in a recliner, semi-reclined and intermittently used an eye mask. She declined sleeping in the adjustable bed. She looked at her phone briefly intermittently.     POST-STUDY QUESTIONNAIRE: Post study, the patient indicated, that sleep was better than usual.   IMPRESSION:  1. Dysfunctions associated with sleep stages or arousal from sleep   RECOMMENDATIONS:  1. This study does not demonstrate any significant obstructive or central sleep disordered breathing with an AHI of less than 5/hour - Her AHI was 1.0/hour. No significant snoring was noted. Treatment with a positive airway pressure device, such as CPAP or autoPAP is not indicated. Patient slept in a recliner, which she  does at home. Snoring from her husband, who slept in the bed next to her, was audible at times.  2. This study shows sleep fragmentation and abnormal sleep stage percentages; these are nonspecific findings and per se do not signify an intrinsic sleep disorder or a cause for the patient's sleep-related symptoms. Causes include (but are not limited to) the first night effect of the sleep study, circadian rhythm disturbances, medication effect or an underlying mood disorder or medical problem.  3. The patient should be cautioned not to drive, work at heights, or operate dangerous or heavy equipment when tired or sleepy. Review and reiteration of good sleep hygiene measures should be pursued with any patient.  4. The patient will be advised to follow up with the referring provider, who will be notified of the test results.   I certify that I have reviewed the entire raw data recording prior to the issuance of this report in accordance with the Standards of Accreditation of the American Academy of Sleep Medicine (AASM).  Huston Foley, MD, PhD Medical Director, [facility]) Diplomat, ABPN (Neurology and Sleep)  Technical Report:   General Information  Name: Ruth [name], Ruth [name] BMI: 32.59 Physician: Huston Foley, MD  ID: [id number] Height: 63.0 in Technician:  , RPSGT  Sex: Female Weight: 184.0 lb Record: [id number]  Age: 52 [March 15, 1940] Date: 10/27/2022    Medical & Medication History    82 year old female with an underlying complex medical history of anemia, arthritis, asthma, eczema, depression, anxiety, fibromyalgia, reflux disease, history of kidney stones, hyperlipidemia, irritable bowel syndrome, prediabetes, and obesity, who reports snoring and excessive daytime somnolence and significant nocturia. She reports that she had a sleep study some 10 years ago. PAP therapy was not recommended at the time. Tylenol, Risaquad, Ventolin, Vitamin  C, Bentyl, Pepcid, Prozac, Cutivate, Nizoral, Lidocaine-Prilocaine, Magnesium, Zinc, Nitrostat, Zofran, Polyvinyl Alcohol, Estrace, Oxycodone   Sleep Disorder      Comments   The patient came into the lab for a PSG. The patient insisted that her husband was to stay with her during the study. She was adamant that she informed the doctor of this information. Also, the patient said she could only sleep in a recliner. Tech informed the patient that the bed was adjustable. The patient declined the adjustable bed. Per patient she has an adjustable bed at home and cannot sleep in it. I tried to accommodate the patient as much as I could. The patient had one restroom break. EKG kept in NSR. No snoring. All sleep stages witnessed. Respiratory events scored with a 4% desat. Slept supine only. AHI was 0 after 2 hrs of TST. The patient had delayed sleep onset. She had a period of WASO.     Lights out: 10:12:30 PM Lights on: 04:58:36 AM   Time Total Supine Side Prone Upright  Recording (TRT) 6h 46.58m 6h 46.60m 0h 0.20m 0h 0.82m 0h 0.16m  Sleep (TST) 3h 58.6m 3h 58.41m 0h 0.33m 0h 0.37m 0h 0.69m   Latency N1 N2 N3 REM Onset Per. Slp. Eff.  Actual 0h 0.22m 0h 3.45m 0h 14.73m 3h 36.70m 1h 51.47m 1h 59.63m 58.55%   Stg Dur Wake N1 N2 N3 REM  Total 168.5 11.5 113.5 91.5 21.0  Supine 168.5 11.5 113.5 91.5 21.0  Side 0.0 0.0 0.0 0.0 0.0  Prone 0.0 0.0 0.0 0.0 0.0  Upright 0.0 0.0 0.0 0.0 0.0   Stg % Wake N1 N2 N3 REM  Total 41.5 4.8 47.7 38.4 8.8  Supine 41.5 4.8 47.7 38.4 8.8  Side 0.0 0.0 0.0 0.0 0.0  Prone 0.0 0.0 0.0 0.0 0.0  Upright 0.0 0.0 0.0 0.0 0.0     Apnea Summary Sub Supine Side Prone Upright  Total 1 Total 1 1 0 0 0    REM 1 1 0 0 0    NREM 0 0 0 0 0  Obs 0 REM 0 0 0 0 0    NREM 0 0 0 0 0  Mix 0 REM 0 0 0 0 0    NREM 0 0 0 0 0  Cen 1 REM 1 1 0 0 0    NREM 0 0 0 0 0   Rera Summary Sub Supine Side Prone Upright  Total 0 Total 0 0 0 0 0    REM 0 0 0 0 0    NREM 0 0 0 0 0   Hypopnea Summary Sub  Supine Side Prone Upright  Total 11 Total 11 11 0 0 0    REM 1 1 0 0 0    NREM 10 10 0 0 0   4%  Hypopnea Summary Sub Supine Side Prone Upright  Total (4%) 3 Total 3 3 0 0 0    REM 1 1 0 0 0    NREM 2 2 0 0 0     AHI Total Obs Mix Cen  3.03 Apnea 0.25 0.00 0.00 0.25   Hypopnea 2.77 -- -- --  1.01 Hypopnea (4%) 0.76 -- -- --    Total Supine Side Prone Upright  Position AHI 3.03 3.03 0.00 0.00 0.00  REM AHI 5.71   NREM AHI 2.77   Position RDI 3.03 3.03 0.00 0.00 0.00  REM RDI 5.71   NREM RDI 2.77    4% Hypopnea Total Supine Side Prone Upright  Position AHI (4%) 1.01 1.01 0.00 0.00 0.00  REM AHI (4%) 5.71   NREM AHI (4%) 0.55   Position RDI (4%) 1.01 1.01 0.00 0.00 0.00  REM RDI (4%) 5.71   NREM RDI (4%) 0.55    Desaturation Information Threshold: 2% <100% <90% <80% <70% <60% <50% <40%  Supine 134.0 5.0 0.0 0.0 0.0 0.0 0.0  Side 0.0 0.0 0.0 0.0 0.0 0.0 0.0  Prone 0.0 0.0 0.0 0.0 0.0 0.0 0.0  Upright 0.0 0.0 0.0 0.0 0.0 0.0 0.0  Total 134.0 5.0 0.0 0.0 0.0 0.0 0.0  Index 27.6 1.0 0.0 0.0 0.0 0.0 0.0   Threshold: 3% <100% <90% <80% <70% <60% <50% <40%  Supine 32.0 2.0 0.0 0.0 0.0 0.0 0.0  Side 0.0 0.0 0.0 0.0 0.0 0.0 0.0  Prone 0.0 0.0 0.0 0.0 0.0 0.0 0.0  Upright 0.0 0.0 0.0 0.0 0.0 0.0 0.0  Total 32.0 2.0 0.0 0.0 0.0 0.0 0.0  Index 6.6 0.4 0.0 0.0 0.0 0.0 0.0   Threshold: 4% <100% <90% <80% <70% <60% <50% <40%  Supine 12.0 2.0 0.0 0.0 0.0 0.0 0.0  Side 0.0 0.0 0.0 0.0 0.0 0.0 0.0  Prone 0.0 0.0 0.0 0.0 0.0 0.0 0.0  Upright 0.0 0.0 0.0 0.0 0.0 0.0 0.0  Total 12.0 2.0 0.0 0.0 0.0 0.0 0.0  Index 2.5 0.4 0.0 0.0 0.0 0.0 0.0   Threshold: 3% <100% <90% <80% <70% <60% <50% <40%  Supine 32 2 0 0 0 0 0  Side 0 0 0 0 0 0 0  Prone 0 0 0 0 0 0 0  Upright 0 0 0 0 0 0 0  Total 32 2 0 0 0 0 0   Awakening/Arousal Information # of Awakenings 12  Wake after sleep onset 57.29m  Wake after persistent sleep 56.11m   Arousal Assoc. Arousals Index  Apneas 0 0.0  Hypopneas 3  0.8  Leg Movements 0 0.0  Snore 0 0.0  PTT Arousals 0 0.0  Spontaneous 19 4.8  Total 22 5.5  Leg Movement Information PLMS LMs Index  Total LMs during PLMS 0 0.0  LMs w/ Microarousals 0 0.0   LM LMs Index  w/ Microarousal 0 0.0  w/ Awakening 0 0.0  w/ Resp Event 0 0.0  Spontaneous 1 0.3  Total 1 0.3     Desaturation threshold setting: 3% Minimum desaturation setting: 10 seconds SaO2 nadir: 86% The longest event was a 43 sec obstructive Hypopnea with a minimum SaO2 of 91%. The lowest SaO2 was 86% associated with a 12 sec central Apnea. EKG Rates EKG Avg Max Min  Awake 67 108 48  Asleep 63 71 58  EKG Events: Tachycardia

## 2022-10-30 DIAGNOSIS — E785 Hyperlipidemia, unspecified: Secondary | ICD-10-CM | POA: Diagnosis not present

## 2022-10-30 DIAGNOSIS — Z79899 Other long term (current) drug therapy: Secondary | ICD-10-CM | POA: Diagnosis not present

## 2022-10-30 DIAGNOSIS — M7918 Myalgia, other site: Secondary | ICD-10-CM | POA: Diagnosis not present

## 2022-10-31 LAB — LIPID PANEL
Chol/HDL Ratio: 4.7 ratio — ABNORMAL HIGH (ref 0.0–4.4)
Cholesterol, Total: 289 mg/dL — ABNORMAL HIGH (ref 100–199)
HDL: 62 mg/dL
LDL Chol Calc (NIH): 210 mg/dL — ABNORMAL HIGH (ref 0–99)
Triglycerides: 101 mg/dL (ref 0–149)
VLDL Cholesterol Cal: 17 mg/dL (ref 5–40)

## 2022-11-03 ENCOUNTER — Telehealth (HOSPITAL_COMMUNITY): Payer: Self-pay | Admitting: *Deleted

## 2022-11-03 ENCOUNTER — Encounter: Payer: Self-pay | Admitting: Family Medicine

## 2022-11-03 ENCOUNTER — Telehealth: Payer: Self-pay

## 2022-11-03 DIAGNOSIS — Z01810 Encounter for preprocedural cardiovascular examination: Secondary | ICD-10-CM

## 2022-11-03 DIAGNOSIS — R002 Palpitations: Secondary | ICD-10-CM

## 2022-11-03 DIAGNOSIS — R072 Precordial pain: Secondary | ICD-10-CM

## 2022-11-03 DIAGNOSIS — R0600 Dyspnea, unspecified: Secondary | ICD-10-CM

## 2022-11-03 NOTE — Telephone Encounter (Signed)
Reaching out to patient to offer assistance regarding upcoming cardiac imaging study; pt verbalizes understanding of appt date/time, parking situation and where to check in, pre-test NPO status, and verified current allergies; name and call back number provided for further questions should they arise  Larey Brick RN Navigator Cardiac Imaging [facility] [phone] office [phone] cell  Patient aware to avoid caffeine 12 hours prior to his cardiac PET scan.

## 2022-11-04 ENCOUNTER — Other Ambulatory Visit (HOSPITAL_COMMUNITY): Payer: Self-pay

## 2022-11-04 ENCOUNTER — Encounter (HOSPITAL_COMMUNITY)
Admission: RE | Admit: 2022-11-04 | Discharge: 2022-11-04 | Disposition: A | Discharge status: Home / Self Care | Payer: No Typology Code available for payment source | Source: Ambulatory Visit | Attending: Cardiovascular Disease | Admitting: Cardiovascular Disease

## 2022-11-04 DIAGNOSIS — R072 Precordial pain: Secondary | ICD-10-CM | POA: Diagnosis not present

## 2022-11-04 LAB — NM PET CT CARDIAC PERFUSION MULTI W/ABSOLUTE BLOODFLOW
LV dias vol: 77 mL (ref 46–106)
LV sys vol: 29 mL
MBFR: 2.1
Nuc Rest EF: 62 %
Nuc Stress EF: 68 %
Peak HR: 83 {beats}/min
Rest HR: 64 {beats}/min
Rest MBF: 1.01 ml/g/min
Rest Nuclear Isotope Dose: 22.2 mCi
ST Depression (mm): 0 mm
Stress MBF: 2.12 ml/g/min
Stress Nuclear Isotope Dose: 22.1 mCi

## 2022-11-04 MED ORDER — REGADENOSON 0.4 MG/5ML IV SOLN
0.4000 mg | Freq: Once | INTRAVENOUS | Status: AC
  Administered 2022-11-04: 0.4 mg via INTRAVENOUS

## 2022-11-04 MED ORDER — RUBIDIUM RB82 GENERATOR (RUBYFILL)
22.0800 | PACK | Freq: Once | INTRAVENOUS | Status: AC
  Administered 2022-11-04: 22.08 via INTRAVENOUS

## 2022-11-04 MED ORDER — RUBIDIUM RB82 GENERATOR (RUBYFILL)
22.2000 | PACK | Freq: Once | INTRAVENOUS | Status: AC
  Administered 2022-11-04: 22.2 via INTRAVENOUS

## 2022-11-04 MED ORDER — REGADENOSON 0.4 MG/5ML IV SOLN
INTRAVENOUS | Status: AC
  Filled 2022-11-04: qty 5

## 2022-11-05 NOTE — Patient Instructions (Signed)
Ruth [name]  11/05/2022     @PREFPERIOPPHARMACY @   Your procedure is scheduled on  11/11/2022.   Report to [facility] at  0815 A.M.   Call this number if you have problems the morning of surgery:  [phone]  If you experience any cold or flu symptoms such as cough, fever, chills, shortness of breath, etc. between now and your scheduled surgery, please notify us at the above number.   Remember:  Follow the diet instructions given to you by the patient.       Use your inhaler before you come and bring your rescue inhaler with you.     Take these medicines the morning of surgery with A SIP OF WATER        pepcid, prozac, zofran (if needed), Oxycodone(if needed).     Do not wear jewelry, make-up or nail polish, including gel polish,  artificial nails, or any other type of covering on natural nails (fingers and  toes).  Do not wear lotions, powders, or perfumes, or deodorant.  Do not shave 48 hours prior to surgery.  Men may shave face and neck.  Do not bring valuables to the hospital.  [facility] is not responsible for any belongings or valuables.  Contacts, dentures or bridgework may not be worn into surgery.  Leave your suitcase in the car.  After surgery it may be brought to your room.  For patients admitted to the hospital, discharge time will be determined by your treatment team.  Patients discharged the day of surgery will not be allowed to drive home and must have someone with them for 24 hours.    Special instructions:   DO NOT smoke tobacco or vape for 24 hours before your procedure.  Please read over the following fact sheets that you were given. Anesthesia Post-op Instructions and Care and Recovery After Surgery      Upper Endoscopy, Adult, Care After After the procedure, it is common to have a sore throat. It is also common to have: Mild stomach pain or discomfort. Bloating. Nausea. Follow these instructions at home: The instructions  below may help you care for yourself at home. Your health care provider may give you more instructions. If you have questions, ask your health care provider. If you were given a sedative during the procedure, it can affect you for several hours. Do not drive or operate machinery until your health care provider says that it is safe. If you will be going home right after the procedure, plan to have a responsible adult: Take you home from the hospital or clinic. You will not be allowed to drive. Care for you for the time you are told. Follow instructions from your health care provider about what you may eat and drink. Return to your normal activities as told by your health care provider. Ask your health care provider what activities are safe for you. Take over-the-counter and prescription medicines only as told by your health care provider. Contact a health care provider if you: Have a sore throat that lasts longer than one day. Have trouble swallowing. Have a fever. Get help right away if you: Vomit blood or your vomit looks like coffee grounds. Have bloody, black, or tarry stools. Have a very bad sore throat or you cannot swallow. Have difficulty breathing or very bad pain in your chest or abdomen. These symptoms may be an emergency. Get help right away. Call 911. Do not wait to see  if the symptoms will go away. Do not drive yourself to the hospital. Summary After the procedure, it is common to have a sore throat, mild stomach discomfort, bloating, and nausea. If you were given a sedative during the procedure, it can affect you for several hours. Do not drive until your health care provider says that it is safe. Follow instructions from your health care provider about what you may eat and drink. Return to your normal activities as told by your health care provider. This information is not intended to replace advice given to you by your health care provider. Make sure you discuss any questions  you have with your health care provider. Document Revised: 05/14/2021 Document Reviewed: 05/14/2021 Elsevier Patient Education  2024 [employer] Monitored Anesthesia Care, Care After The following information offers guidance on how to care for yourself after your procedure. Your health care provider may also give you more specific instructions. If you have problems or questions, contact your health care provider. What can I expect after the procedure? After the procedure, it is common to have: Tiredness. Little or no memory about what happened during or after the procedure. Impaired judgment when it comes to making decisions. Nausea or vomiting. Some trouble with balance. Follow these instructions at home: For the time period you were told by your health care provider:  Rest. Do not participate in activities where you could fall or become injured. Do not drive or use machinery. Do not drink alcohol. Do not take sleeping pills or medicines that cause drowsiness. Do not make important decisions or sign legal documents. Do not take care of children on your own. Medicines Take over-the-counter and prescription medicines only as told by your health care provider. If you were prescribed antibiotics, take them as told by your health care provider. Do not stop using the antibiotic even if you start to feel better. Eating and drinking Follow instructions from your health care provider about what you may eat and drink. Drink enough fluid to keep your urine pale yellow. If you vomit: Drink clear fluids slowly and in small amounts as you are able. Clear fluids include water, ice chips, low-calorie sports drinks, and fruit juice that has water added to it (diluted fruit juice). Eat light and bland foods in small amounts as you are able. These foods include bananas, applesauce, rice, lean meats, toast, and crackers. General instructions  Have a responsible adult stay with you for the time you  are told. It is important to have someone help care for you until you are awake and alert. If you have sleep apnea, surgery and some medicines can increase your risk for breathing problems. Follow instructions from your health care provider about wearing your sleep device: When you are sleeping. This includes during daytime naps. While taking prescription pain medicines, sleeping medicines, or medicines that make you drowsy. Do not use any products that contain nicotine or tobacco. These products include cigarettes, chewing tobacco, and vaping devices, such as e-cigarettes. If you need help quitting, ask your health care provider. Contact a health care provider if: You feel nauseous or vomit every time you eat or drink. You feel light-headed. You are still sleepy or having trouble with balance after 24 hours. You get a rash. You have a fever. You have redness or swelling around the IV site. Get help right away if: You have trouble breathing. You have new confusion after you get home. These symptoms may be an emergency. Get help right away. Call  911. Do not wait to see if the symptoms will go away. Do not drive yourself to the hospital. This information is not intended to replace advice given to you by your health care provider. Make sure you discuss any questions you have with your health care provider. Document Revised: 06/30/2021 Document Reviewed: 06/30/2021 Elsevier Patient Education  2024 [employer].

## 2022-11-09 ENCOUNTER — Encounter (HOSPITAL_COMMUNITY): Payer: Self-pay

## 2022-11-09 ENCOUNTER — Encounter (HOSPITAL_COMMUNITY)
Admission: RE | Admit: 2022-11-09 | Discharge: 2022-11-09 | Disposition: A | Discharge status: Home / Self Care | Payer: No Typology Code available for payment source | Source: Ambulatory Visit | Attending: Gastroenterology

## 2022-11-09 VITALS — BP 138/67 | HR 68 | Temp 97.8°F | Resp 18 | Ht 64.0 in | Wt 185.8 lb

## 2022-11-09 DIAGNOSIS — Z01812 Encounter for preprocedural laboratory examination: Secondary | ICD-10-CM | POA: Insufficient documentation

## 2022-11-09 DIAGNOSIS — R7303 Prediabetes: Secondary | ICD-10-CM | POA: Diagnosis not present

## 2022-11-09 DIAGNOSIS — Z01818 Encounter for other preprocedural examination: Secondary | ICD-10-CM | POA: Diagnosis present

## 2022-11-09 DIAGNOSIS — D649 Anemia, unspecified: Secondary | ICD-10-CM | POA: Diagnosis not present

## 2022-11-09 LAB — BASIC METABOLIC PANEL
Anion gap: 14 (ref 5–15)
BUN: 21 mg/dL (ref 8–23)
CO2: 20 mmol/L — ABNORMAL LOW (ref 22–32)
Calcium: 9.2 mg/dL (ref 8.9–10.3)
Chloride: 104 mmol/L (ref 98–111)
Creatinine, Ser: 0.79 mg/dL (ref 0.44–1.00)
GFR, Estimated: >60 mL/min (ref >60)
Glucose, Bld: 96 mg/dL (ref 70–99)
Potassium: 4.3 mmol/L (ref 3.5–5.1)
Sodium: 138 mmol/L (ref 135–145)

## 2022-11-11 ENCOUNTER — Ambulatory Visit (HOSPITAL_COMMUNITY): Payer: No Typology Code available for payment source | Admitting: Certified Registered"

## 2022-11-11 ENCOUNTER — Encounter (HOSPITAL_COMMUNITY): Payer: Self-pay | Admitting: Gastroenterology

## 2022-11-11 ENCOUNTER — Telehealth: Payer: Self-pay

## 2022-11-11 ENCOUNTER — Ambulatory Visit (HOSPITAL_COMMUNITY)
Admission: RE | Admit: 2022-11-11 | Discharge: 2022-11-11 | Disposition: A | Discharge status: Home / Self Care | Payer: No Typology Code available for payment source | Attending: Gastroenterology | Admitting: Gastroenterology

## 2022-11-11 ENCOUNTER — Encounter (HOSPITAL_COMMUNITY)
Admission: RE | Disposition: A | Discharge status: Home / Self Care | Payer: Self-pay | Source: Home / Self Care | Attending: Gastroenterology

## 2022-11-11 ENCOUNTER — Other Ambulatory Visit: Payer: Self-pay

## 2022-11-11 DIAGNOSIS — K589 Irritable bowel syndrome without diarrhea: Secondary | ICD-10-CM | POA: Diagnosis not present

## 2022-11-11 DIAGNOSIS — J45909 Unspecified asthma, uncomplicated: Secondary | ICD-10-CM

## 2022-11-11 DIAGNOSIS — E785 Hyperlipidemia, unspecified: Secondary | ICD-10-CM | POA: Diagnosis not present

## 2022-11-11 DIAGNOSIS — K259 Gastric ulcer, unspecified as acute or chronic, without hemorrhage or perforation: Secondary | ICD-10-CM

## 2022-11-11 DIAGNOSIS — G8929 Other chronic pain: Secondary | ICD-10-CM | POA: Diagnosis present

## 2022-11-11 DIAGNOSIS — R1013 Epigastric pain: Secondary | ICD-10-CM | POA: Diagnosis present

## 2022-11-11 DIAGNOSIS — Z7722 Contact with and (suspected) exposure to environmental tobacco smoke (acute) (chronic): Secondary | ICD-10-CM | POA: Diagnosis not present

## 2022-11-11 DIAGNOSIS — K319 Disease of stomach and duodenum, unspecified: Secondary | ICD-10-CM | POA: Diagnosis not present

## 2022-11-11 DIAGNOSIS — Z8711 Personal history of peptic ulcer disease: Secondary | ICD-10-CM | POA: Diagnosis not present

## 2022-11-11 DIAGNOSIS — F418 Other specified anxiety disorders: Secondary | ICD-10-CM

## 2022-11-11 DIAGNOSIS — G4730 Sleep apnea, unspecified: Secondary | ICD-10-CM | POA: Diagnosis not present

## 2022-11-11 DIAGNOSIS — R072 Precordial pain: Secondary | ICD-10-CM

## 2022-11-11 DIAGNOSIS — K219 Gastro-esophageal reflux disease without esophagitis: Secondary | ICD-10-CM | POA: Insufficient documentation

## 2022-11-11 DIAGNOSIS — G4733 Obstructive sleep apnea (adult) (pediatric): Secondary | ICD-10-CM | POA: Diagnosis not present

## 2022-11-11 DIAGNOSIS — K3189 Other diseases of stomach and duodenum: Secondary | ICD-10-CM

## 2022-11-11 DIAGNOSIS — F419 Anxiety disorder, unspecified: Secondary | ICD-10-CM | POA: Diagnosis not present

## 2022-11-11 DIAGNOSIS — R0789 Other chest pain: Secondary | ICD-10-CM | POA: Diagnosis not present

## 2022-11-11 DIAGNOSIS — R110 Nausea: Secondary | ICD-10-CM | POA: Diagnosis not present

## 2022-11-11 DIAGNOSIS — D500 Iron deficiency anemia secondary to blood loss (chronic): Secondary | ICD-10-CM

## 2022-11-11 DIAGNOSIS — F32A Depression, unspecified: Secondary | ICD-10-CM | POA: Diagnosis not present

## 2022-11-11 HISTORY — PX: BIOPSY: SHX5522

## 2022-11-11 HISTORY — PX: ESOPHAGOGASTRODUODENOSCOPY (EGD) WITH PROPOFOL: SHX5813

## 2022-11-11 LAB — GLUCOSE, CAPILLARY: Glucose-Capillary: 98 mg/dL (ref 70–99)

## 2022-11-11 SURGERY — ESOPHAGOGASTRODUODENOSCOPY (EGD) WITH PROPOFOL
Anesthesia: General

## 2022-11-11 MED ORDER — LIDOCAINE HCL (CARDIAC) PF 100 MG/5ML IV SOSY
PREFILLED_SYRINGE | INTRAVENOUS | Status: DC | PRN
  Administered 2022-11-11: 100 mg via INTRATRACHEAL

## 2022-11-11 MED ORDER — PROPOFOL 10 MG/ML IV BOLUS
INTRAVENOUS | Status: DC | PRN
  Administered 2022-11-11: 20 mg via INTRAVENOUS
  Administered 2022-11-11: 70 mg via INTRAVENOUS
  Administered 2022-11-11 (×2): 20 mg via INTRAVENOUS

## 2022-11-11 MED ORDER — LACTATED RINGERS IV SOLN: INTRAVENOUS | Status: DC

## 2022-11-11 MED ORDER — OMEPRAZOLE 40 MG PO CPDR: 40.0000 mg | DELAYED_RELEASE_CAPSULE | Freq: Every day | ORAL | 3 refills | Status: DC

## 2022-11-11 NOTE — Telephone Encounter (Signed)
-----   Message from [facility] sent at 11/11/2022  1:09 PM EDT ----- Nonurgent upper EUS in next 3 to 4 months can be scheduled. Thanks. GM ----- Message ----- From: Loretha Stapler, RN Sent: 11/11/2022  10:30 AM EDT To: Larose Kells., MD   ----- Message ----- From: Simone Curia Sent: 11/11/2022  10:15 AM EDT To: Loretha Stapler, RN  Trula Slade, please see Dr Wilburt Finlay note below. Thanks ----- Message ----- From: Dolores Frame, MD Sent: 11/11/2022  10:12 AM EDT To: Suzzanne Cloud, can you please refer the patient to Dr. Meridee Score for EUS? Dx: gastric submucosal lesion  Thanks,   Katrinka Blazing, MD Gastroenterology and Hepatology [facility]

## 2022-11-11 NOTE — Discharge Instructions (Signed)
You are being discharged to home.  Resume your previous diet.  We are waiting for your pathology results.  Take Prilosec (omeprazole) 40 mg by mouth once a day.  Referral for EUS evaluation of gastric submucosal lesion.

## 2022-11-11 NOTE — OR Nursing (Signed)
Attempted to draw blood from patient for pre procedure lab. Unable to draw x2 attempts. Spoke with Dr. Johnnette Litter, anesthesia, he is ok with not getting lab work prior to EGD.

## 2022-11-11 NOTE — Op Note (Signed)
[facility] Patient Name: Ruth [name] Procedure Date: 11/11/2022 9:21 AM MRN: [id number] Date of Birth: Jan 07, 1941 Attending MD: Katrinka Blazing , , [id number] CSN: [id number] Age: 82 Admit Type: Outpatient Procedure:                Upper GI endoscopy Indications:              Epigastric abdominal pain, Chest pain (non cardiac) Providers:                Katrinka Blazing, Buel Ream. Thomasena Edis RN, RN,                            Zena Amos Referring MD:              Medicines:                Monitored Anesthesia Care Complications:            No immediate complications. Estimated Blood Loss:     Estimated blood loss: none. Procedure:                Pre-Anesthesia Assessment:                           - Prior to the procedure, a History and Physical                            was performed, and patient medications, allergies                            and sensitivities were reviewed. The patient's                            tolerance of previous anesthesia was reviewed.                           - The risks and benefits of the procedure and the                            sedation options and risks were discussed with the                            patient. All questions were answered and informed                            consent was obtained.                           - ASA Grade Assessment: II - A patient with mild                            systemic disease.                           After obtaining informed consent, the endoscope was                            passed under direct vision.  Throughout the                            procedure, the patient's blood pressure, pulse, and                            oxygen saturations were monitored continuously. The                            [id number]-H190 ([id number]) scope was introduced through the                            mouth, and advanced to the second part of duodenum.                            The upper GI endoscopy was  accomplished without                            difficulty. The patient tolerated the procedure                            well. Scope In: 9:43:29 AM Scope Out: 9:53:25 AM Total Procedure Duration: 0 hours 9 minutes 56 seconds  Findings:      The examined esophagus was normal.      A single 10 to 12 mm submucosal papule (nodule) with no bleeding was       found on the anterior wall of the gastric body. The nodule was Paris       classification Is (protruding, sessile).      A few localized diminutive erosions with no stigmata of recent bleeding       were found in the gastric antrum. Biopsies were taken with a cold       forceps for Helicobacter pylori testing.      The examined duodenum was normal. Biopsies were taken with a cold       forceps for histology. Impression:               - Normal esophagus.                           - A single submucosal papule (nodule) found in the                            stomach.                           - Erosive gastropathy with no stigmata of recent                            bleeding. Biopsied.                           - Normal examined duodenum. Biopsied. Moderate Sedation:      Per Anesthesia Care Recommendation:           - Discharge patient to home (ambulatory).                           -  Resume previous diet.                           - Await pathology results.                           - Use Prilosec (omeprazole) 40 mg PO daily.                           - Referral for EUS evaluation of gastric submucosal                            lesion. Procedure Code(s):        --- Professional ---                           [id number], Esophagogastroduodenoscopy, flexible,                            transoral; with biopsy, single or multiple Diagnosis Code(s):        --- Professional ---                           K31.89, Other diseases of stomach and duodenum                           R10.13, Epigastric pain                           R07.89, Other  chest pain CPT copyright 2022 American [employer] Association. All rights reserved. The codes documented in this report are preliminary and upon coder review may  be revised to meet current compliance requirements. Katrinka Blazing, MD Katrinka Blazing,  11/11/2022 10:01:09 AM This report has been signed electronically. Number of Addenda: 0

## 2022-11-11 NOTE — Anesthesia Preprocedure Evaluation (Signed)
Anesthesia Evaluation  Patient identified by MRN, date of birth, ID band Patient awake    Reviewed: Allergy & Precautions, H&P , NPO status , Patient's Chart, lab work & pertinent test results, reviewed documented beta blocker date and time   History of Anesthesia Complications (+) PONV and history of anesthetic complications  Airway Mallampati: II  TM Distance: >3 FB Neck ROM: full    Dental no notable dental hx.    Pulmonary neg pulmonary ROS, asthma , sleep apnea , pneumonia   Pulmonary exam normal breath sounds clear to auscultation       Cardiovascular Exercise Tolerance: Good negative cardio ROS  Rhythm:regular Rate:Normal     Neuro/Psych  PSYCHIATRIC DISORDERS Anxiety Depression     Neuromuscular disease negative neurological ROS  negative psych ROS   GI/Hepatic negative GI ROS, Neg liver ROS, PUD,GERD  ,,  Endo/Other  negative endocrine ROS    Renal/GU Renal diseasenegative Renal ROS  negative genitourinary   Musculoskeletal   Abdominal   Peds  Hematology negative hematology ROS (+) Blood dyscrasia, anemia   Anesthesia Other Findings   Reproductive/Obstetrics negative OB ROS                             Anesthesia Physical Anesthesia Plan  ASA: 3  Anesthesia Plan: General   Post-op Pain Management:    Induction:   PONV Risk Score and Plan: Propofol infusion  Airway Management Planned:   Additional Equipment:   Intra-op Plan:   Post-operative Plan:   Informed Consent: I have reviewed the patients History and Physical, chart, labs and discussed the procedure including the risks, benefits and alternatives for the proposed anesthesia with the patient or authorized representative who has indicated his/her understanding and acceptance.     Dental Advisory Given  Plan Discussed with: CRNA  Anesthesia Plan Comments:        Anesthesia Quick Evaluation

## 2022-11-11 NOTE — H&P (Signed)
Ruth [name] is an 82 y.o. female.   Chief Complaint: epigastric and lower chest pain HPI: Ruth [name] is a 82 y.o. female with past medical history of anemia, anxiety, arthritis, depression, fatty liver, fibromyalgia, GERD, H pylori ulcer, HLD, IBS, OSA, who comes for evaluation of epigastric pain and lower chest pain.  Reports that since 1970s she has presented initially intermittent episodes of burning in her lower sternal region and epigastric area.  States that she is now having the pain every night.  She denies any nausea, vomiting, fever, chills, melena or hematochezia.  Past Medical History:  Diagnosis Date   Anemia    Anxiety    Arthritis    Asthma    Complication of anesthesia    woke up during surgery and hard to wake up, laryngospasms   Depression    Eczema    Fatty liver    Fibromyalgia    GERD (gastroesophageal reflux disease)    EGD/ colon 1/09   H pylori ulcer 1980-1990   s/p treatment   History of kidney stones    Hyperlipidemia    IBS (irritable bowel syndrome)    Kidney cysts    Laryngospasm    Laryngospasms    with anesthesia   Neck pain    OSA (obstructive sleep apnea)    pt havin gsleep study  late part of 09/2022   Pneumonia 2013   PONV (postoperative nausea and vomiting)    Pre-diabetes    Refusal of blood transfusions as patient is Jehovah's Witness     Past Surgical History:  Procedure Laterality Date   BILATERAL SALPINGOOPHORECTOMY  1990s   abdominal   BIOPSY  09/11/2020   Procedure: BIOPSY;  Surgeon: Malissa Hippo, MD;  [facility]: [facility];  Service: Endoscopy;;  gastric   CARDIAC CATHETERIZATION  [facility] normal coronary arteries    COLONOSCOPY  01/2007   Dr. Laurie Panda   COLONOSCOPY N/A 04/18/2015   Procedure: COLONOSCOPY;  Surgeon: Malissa Hippo, MD;  [facility]: [facility];  Service: Endoscopy;  Laterality: N/A;  830 - moved to 8:55 - Ann to notify pt   COLONOSCOPY WITH ESOPHAGOGASTRODUODENOSCOPY (EGD)   02/16/2012   Procedure: COLONOSCOPY WITH ESOPHAGOGASTRODUODENOSCOPY (EGD);  Surgeon: Corbin Ade, MD;  [facility];  Service: Endoscopy;  Laterality: N/A;  8:45   COLONOSCOPY WITH PROPOFOL N/A 09/11/2020   Procedure: COLONOSCOPY WITH PROPOFOL;  Surgeon: Malissa Hippo, MD;  [facility]: [facility];  Service: Endoscopy;  Laterality: N/A;  10:55   ESOPHAGEAL DILATION N/A 06/07/2014   Procedure: ESOPHAGEAL DILATION;  Surgeon: Malissa Hippo, MD;  [facility];  Service: Endoscopy;  Laterality: N/A;   ESOPHAGEAL DILATION N/A 09/11/2020   Procedure: ESOPHAGEAL DILATION;  Surgeon: Malissa Hippo, MD;  [facility];  Service: Endoscopy;  Laterality: N/A;   ESOPHAGOGASTRODUODENOSCOPY  01/2007   Dr Jarold Motto- 3 cm hiatal hernia, benign esophageal biopsies, erosive esophagitis, gastritis   ESOPHAGOGASTRODUODENOSCOPY N/A 06/07/2014   Procedure: ESOPHAGOGASTRODUODENOSCOPY (EGD);  Surgeon: Malissa Hippo, MD;  [facility];  Service: Endoscopy;  Laterality: N/A;  1200   ESOPHAGOGASTRODUODENOSCOPY N/A 08/18/2017   Procedure: ESOPHAGOGASTRODUODENOSCOPY (EGD);  Surgeon: Malissa Hippo, MD;  [facility];  Service: Endoscopy;  Laterality: N/A;  2:20   ESOPHAGOGASTRODUODENOSCOPY (EGD) WITH PROPOFOL N/A 09/11/2020   Procedure: ESOPHAGOGASTRODUODENOSCOPY (EGD) WITH PROPOFOL;  Surgeon: Malissa Hippo, MD;  [facility];  Service: Endoscopy;  Laterality: N/A;  FOOT SURGERY Left 2005   hammer toe   INNER EAR SURGERY     Left   KNEE SURGERY Left    x 2   POLYPECTOMY  09/11/2020   Procedure: POLYPECTOMY;  Surgeon: Malissa Hippo, MD;  [facility];  Service: Endoscopy;;   SHOULDER ARTHROSCOPY WITH ROTATOR CUFF REPAIR Right 05/07/2021   Procedure: SHOULDER ARTHROSCOPY WITH MANIPULATION UNDER ANESTHESIA ,SUBACROMIAL DECOMPRESSION, DISTAL CLAVICLE RESECTION, ROTATOR CUFF REPAIR , BICEPS TENOTOMY;  Surgeon: Eugenia Mcalpine, MD;   Location: [facility];  Service: Orthopedics;  Laterality: Right;  WITH INTERSCALENE BLOCK NEEDS PRE OP CONSULT  WITH ANESTHESIA 120   SHOULDER SURGERY Left 2011   TONSILLECTOMY     age 38   TOTAL KNEE ARTHROPLASTY Left 02/07/2018   Procedure: TOTAL KNEE ARTHROPLASTY;  Surgeon: Ollen Gross, MD;  Location: [facility];  Service: Orthopedics;  Laterality: Left;    TUBAL LIGATION  1970   with an appy   TYMPANOSTOMY TUBE PLACEMENT  1984   VAGINAL HYSTERECTOMY  1970    Family History  Problem Relation Age of Onset   Paranoid behavior Mother        depression   Colon polyps Mother    Eczema Mother    Depression Mother    Diabetes Father    Heart attack Father        75's   Lung cancer Father    Cirrhosis Father 61       etoh cirrhosis   Eczema Father    Lung cancer Sister    Depression Sister    Alzheimer's disease Sister    Hypertension Sister    Diabetes Sister    Lung cancer Sister    Sleep apnea Sister    Depression Maternal Aunt    COPD Daughter    Drug abuse Niece    Suicidality Niece    Allergic rhinitis Neg Hx    Asthma Neg Hx    Urticaria Neg Hx    Social History:  reports that she has never smoked. She has been exposed to tobacco smoke. She has never used smokeless tobacco. She reports current alcohol use. She reports that she does not use drugs.  Allergies:  Allergies  Allergen Reactions   Blood-Group Specific Substance Other (See Comments)    NO BLOOD PRODUCTS-refuses transfusions   Ciprofloxacin Other (See Comments)    Extreme fatigue   Codeine Nausea And Vomiting   Latex Other (See Comments)    Red and raw area around incision   Sertraline Hcl Other (See Comments)    Made patient a "zombie"   Vicodin [Hydrocodone-Acetaminophen] Nausea And Vomiting   Tramadol Swelling, Palpitations and Hypertension    Increased heart rate,increased blood pressure, edema    Medications Prior to Admission  Medication Sig Dispense Refill   acetaminophen (TYLENOL)  500 MG tablet Take 1,000 mg by mouth every 6 (six) hours as needed for moderate pain.     acidophilus (RISAQUAD) CAPS capsule Take 1 capsule by mouth 4 (four) times a week.     Ascorbic Acid (VITAMIN C PO) Take 1 tablet by mouth 4 (four) times a week.     b complex vitamins capsule Take 1 capsule by mouth 4 (four) times a week.     dicyclomine (BENTYL) 10 MG capsule Take 1 capsule (10 mg total) by mouth 3 (three) times daily as needed. For stomach cramps 90 capsule 5   famotidine (PEPCID) 40 MG tablet Take 1 tablet (40 mg total) by  mouth daily as needed. 90 tablet 1   FLUoxetine (PROZAC) 10 MG capsule TAKE 2 CAPSULES BY MOUTH TWICE DAILY 120 capsule 3   fluticasone (CUTIVATE) 0.05 % cream Apply 1 application  topically daily as needed (irritation).     ketoconazole (NIZORAL) 2 % cream Apply 1 application topically daily. (Patient taking differently: Apply 1 application  topically daily as needed for irritation.) 60 g 5   ondansetron (ZOFRAN) 4 MG tablet Take 1 tablet (4 mg total) by mouth every 8 (eight) hours as needed for nausea or vomiting. 20 tablet 0   oxyCODONE (ROXICODONE) 5 MG immediate release tablet 1/2 to 1 q 6 hours prn pain 10 tablet 0   Polyvinyl Alcohol (LUBRICANT DROPS OP) Place 2 drops into both eyes 3 (three) times daily as needed (for dry eyes).     albuterol (VENTOLIN HFA) 108 (90 Base) MCG/ACT inhaler Inhale 2 puffs into the lungs every 6 (six) hours as needed for wheezing. 1 each 2   lidocaine-prilocaine (EMLA) cream Apply 1 Application topically as needed. 30 g 2   nitroGLYCERIN (NITROSTAT) 0.4 MG SL tablet as needed.     rosuvastatin (CRESTOR) 5 MG tablet Take 1 tablet (5 mg total) by mouth daily. 90 tablet 1    Results for orders placed or performed during the hospital encounter of 11/11/22 (from the past 48 hour(s))  Glucose, capillary     Status: None   Collection Time: 11/11/22  9:15 AM  Result Value Ref Range   Glucose-Capillary 98 70 - 99 mg/dL    Comment: Glucose  reference range applies only to samples taken after fasting for at least 8 hours.   No results found.  Review of Systems  Cardiovascular:  Positive for chest pain.  Gastrointestinal:  Positive for abdominal pain.  All other systems reviewed and are negative.   Blood pressure 139/65, pulse 68, temperature 97.9 F (36.6 C), temperature source Oral, resp. rate 18, height 5\' 4"  (1.626 m), weight 84.3 kg, SpO2 100%. Physical Exam  GENERAL: The patient is AO x3, in no acute distress. HEENT: Head is normocephalic and atraumatic. EOMI are intact. Mouth is well hydrated and without lesions. NECK: Supple. No masses LUNGS: Clear to auscultation. No presence of rhonchi/wheezing/rales. Adequate chest expansion HEART: RRR, normal s1 and s2. ABDOMEN: Soft, nontender, no guarding, no peritoneal signs, and nondistended. BS +. No masses. EXTREMITIES: Without any cyanosis, clubbing, rash, lesions or edema. NEUROLOGIC: AOx3, no focal motor deficit. SKIN: no jaundice, no rashes  Assessment/Plan Ruth [name] is a 82 y.o. female with past medical history of anemia, anxiety, arthritis, depression, fatty liver, fibromyalgia, GERD, H pylori ulcer, HLD, IBS, OSA, who comes for evaluation of epigastric pain and lower chest pain.  Will proceed with EGD.  Dolores Frame, MD 11/11/2022, 9:27 AM

## 2022-11-11 NOTE — Telephone Encounter (Signed)
EUS has been set up for 01/18/23 at 9 am at [facility] with GM   Left message on machine to call back

## 2022-11-11 NOTE — Transfer of Care (Signed)
Immediate Anesthesia Transfer of Care Note  Patient: DEVON LOVEN  Procedure(s) Performed: ESOPHAGOGASTRODUODENOSCOPY (EGD) WITH PROPOFOL BIOPSY  Patient Location: PACU  Anesthesia Type:General  Level of Consciousness: awake, alert , and oriented  Airway & Oxygen Therapy: Patient Spontanous Breathing  Post-op Assessment: Report given to RN and Post -op Vital signs reviewed and stable  Post vital signs: Reviewed and stable  Last Vitals:  Vitals Value Taken Time  BP 107/48 11/11/22 0958  Temp 36.4 C 11/11/22 0958  Pulse 63 11/11/22 0958  Resp 16 11/11/22 0958  SpO2 99 % 11/11/22 0958    Last Pain:  Vitals:   11/11/22 0958  TempSrc: Oral  PainSc: 0-No pain         Complications: No notable events documented.

## 2022-11-12 LAB — SURGICAL PATHOLOGY

## 2022-11-12 NOTE — Telephone Encounter (Signed)
Inbound call from patient's husband, requesting to speak with a nurse in regards to patient's pain. They state they are unsure if she should go to the emergency room. Would also like to be placed on a cancellation list with the hospital procedure for something sooner.

## 2022-11-12 NOTE — Telephone Encounter (Signed)
EUS scheduled, pt instructed and medications reviewed.  Patient instructions mailed to home.  Patient to call with any questions or concerns.  

## 2022-11-12 NOTE — Telephone Encounter (Signed)
I spoke to the pt and advised that she should call her primary GI with any pain complaints.  I have advised that we can put her on a wait list and call if able to move sooner.

## 2022-11-13 ENCOUNTER — Encounter (INDEPENDENT_AMBULATORY_CARE_PROVIDER_SITE_OTHER): Payer: Self-pay | Admitting: *Deleted

## 2022-11-16 ENCOUNTER — Telehealth: Payer: Self-pay | Admitting: Family Medicine

## 2022-11-16 DIAGNOSIS — H04123 Dry eye syndrome of bilateral lacrimal glands: Secondary | ICD-10-CM | POA: Diagnosis not present

## 2022-11-16 DIAGNOSIS — H25813 Combined forms of age-related cataract, bilateral: Secondary | ICD-10-CM | POA: Diagnosis not present

## 2022-11-16 DIAGNOSIS — H43391 Other vitreous opacities, right eye: Secondary | ICD-10-CM | POA: Diagnosis not present

## 2022-11-16 DIAGNOSIS — H40013 Open angle with borderline findings, low risk, bilateral: Secondary | ICD-10-CM | POA: Diagnosis not present

## 2022-11-16 DIAGNOSIS — H1045 Other chronic allergic conjunctivitis: Secondary | ICD-10-CM | POA: Diagnosis not present

## 2022-11-16 NOTE — Anesthesia Postprocedure Evaluation (Signed)
Anesthesia Post Note  Patient: Ruth [name]  Procedure(s) Performed: ESOPHAGOGASTRODUODENOSCOPY (EGD) WITH PROPOFOL BIOPSY  Patient location during evaluation: Phase II Anesthesia Type: General Level of consciousness: awake Pain management: pain level controlled Vital Signs Assessment: post-procedure vital signs reviewed and stable Respiratory status: spontaneous breathing and respiratory function stable Cardiovascular status: blood pressure returned to baseline and stable Postop Assessment: no headache and no apparent nausea or vomiting Anesthetic complications: no Comments: Late entry   No notable events documented.   Last Vitals:  Vitals:   11/11/22 0904 11/11/22 0958  BP: 139/65 (!) 107/48  Pulse: 68 63  Resp: 18 16  Temp: 36.6 C 36.4 C  SpO2: 100% 99%    Last Pain:  Vitals:   11/11/22 0958  TempSrc: Oral  PainSc: 0-No pain                 Windell Norfolk

## 2022-11-16 NOTE — Telephone Encounter (Signed)
Patient is requesting refill oxyCODONE (ROXICODONE) 5 MG immediate release tablet send to [facility]

## 2022-11-17 ENCOUNTER — Ambulatory Visit (INDEPENDENT_AMBULATORY_CARE_PROVIDER_SITE_OTHER): Payer: No Typology Code available for payment source | Admitting: Gastroenterology

## 2022-11-17 ENCOUNTER — Encounter (INDEPENDENT_AMBULATORY_CARE_PROVIDER_SITE_OTHER): Payer: Self-pay | Admitting: Gastroenterology

## 2022-11-17 VITALS — BP 116/73 | HR 76 | Temp 97.1°F | Ht 64.0 in | Wt 183.5 lb

## 2022-11-17 DIAGNOSIS — R1013 Epigastric pain: Secondary | ICD-10-CM | POA: Diagnosis not present

## 2022-11-17 NOTE — Patient Instructions (Signed)
Please continue with omeprazole 40mg  daily As discussed, we will get a CT scan of your abdomen to rule out other causes of your pain Please keep scheduled appt for EUS in December  Follow up 4 months  It was a pleasure to see you today. I want to create trusting relationships with patients and provide genuine, compassionate, and quality care. I truly value your feedback! please be on the lookout for a survey regarding your visit with me today. I appreciate your input about our visit and your time in completing this!    Maury Groninger L. Jeanmarie Hubert, MSN, APRN, AGNP-C Adult-Gerontology Nurse Practitioner [facility] at [facility] [place]

## 2022-11-17 NOTE — Progress Notes (Signed)
Referring Provider: Babs Sciara, MD Primary Care Physician:  Babs Sciara, MD Primary GI Physician: Dr. Levon Hedger   Chief Complaint  Patient presents with   Follow-up    Patient here today for a follow up. Patient has no complaints other than all over body aches.   HPI:   Ruth [name] is a 82 y.o. female with past medical history of  anemia, anxiety, arthritis, depression, fatty liver, fibromyalgia, GERD, H pylori ulcer, HLD, IBS, OSA.   Patient presenting today for follow up of upper abdominal pain  Last seen June 2024, at thatt ime having upper abdominal pain for many years. Has nausea. Taking famotidine 40mg  PRN with good results. History of PUD   Recommended to continue with famotidine 40mg  PRN, Schedule EGD.  EGD as outlined below, referred to Dr. Meridee Score for EUS (scheduled for December)  Present:  She notes ongoing epigastric pain. Worse at night. She cannot pinpoint any other precipitating factors. Sometimes eating helps pain, and sometimes it doesn't. Has had some nausea, no vomiting. She feels appetite may be increased since EGD but tends to go up and down. She does not feel that  abdominal pain has improved any with the start of omeprazole 40mg  daily after EGD. She has had some weight loss, reporting she was previously 217 lbs, now 183 lbs, has decreased sugar intake and carbs and trying to watch what she eats. She sometimes takes tylenol for her abdominal pain, she is unsure if this helps or not. She cannot tell me if pain radiates into her back or not.   She has some constipation which she takes stool softeners for, occasionally will have to take dulcolax. She notes some thinner stools at times. Denies rectal bleeding or melena.   Last Last Colonoscopy:08/2020  - One small polyp in the ascending colon. (TA)                           - External hemorrhoids.                           - Anal papilla(e) were hypertrophied. Last Endoscopy:11/11/22 - Normal  esophagus.                           - A single submucosal papule (nodule) found in the                            stomach.                           - Erosive gastropathy with no stigmata of recent                            bleeding. Biopsied.                           - Normal examined duodenum. Biopsied. A. SMALL BOWEL, BIOPSY: Benign duodenal mucosa with no diagnostic abnormality   B. STOMACH, BIOPSY: Mild reactive gastropathy Negative for H. pylori, intestinal metaplasia, dysplasia and carcinoma  Recommendations:    Past Medical History:  Diagnosis Date   Anemia    Anxiety    Arthritis    Asthma    Complication of anesthesia  woke up during surgery and hard to wake up, laryngospasms   Depression    Eczema    Fatty liver    Fibromyalgia    GERD (gastroesophageal reflux disease)    EGD/ colon 1/09   H pylori ulcer 1980-1990   s/p treatment   History of kidney stones    Hyperlipidemia    IBS (irritable bowel syndrome)    Kidney cysts    Laryngospasm    Laryngospasms    with anesthesia   Neck pain    OSA (obstructive sleep apnea)    pt havin gsleep study  late part of 09/2022   Pneumonia 2013   PONV (postoperative nausea and vomiting)    Pre-diabetes    Refusal of blood transfusions as patient is Jehovah's Witness     Past Surgical History:  Procedure Laterality Date   BILATERAL SALPINGOOPHORECTOMY  1990s   abdominal   BIOPSY  09/11/2020   Procedure: BIOPSY;  Surgeon: Malissa Hippo, MD;  [facility];  Service: Endoscopy;;  gastric   CARDIAC CATHETERIZATION  2002   [facility]) normal coronary arteries    COLONOSCOPY  01/2007   Dr. Laurie Panda   COLONOSCOPY N/A 04/18/2015   Procedure: COLONOSCOPY;  Surgeon: Malissa Hippo, MD;  [facility];  Service: Endoscopy;  Laterality: N/A;  830 - moved to 8:55 - Ann to notify pt   COLONOSCOPY WITH ESOPHAGOGASTRODUODENOSCOPY (EGD)  02/16/2012   Procedure: COLONOSCOPY WITH  ESOPHAGOGASTRODUODENOSCOPY (EGD);  Surgeon: Corbin Ade, MD;  [facility];  Service: Endoscopy;  Laterality: N/A;  8:45   COLONOSCOPY WITH PROPOFOL N/A 09/11/2020   Procedure: COLONOSCOPY WITH PROPOFOL;  Surgeon: Malissa Hippo, MD;  [facility];  Service: Endoscopy;  Laterality: N/A;  10:55   ESOPHAGEAL DILATION N/A 06/07/2014   Procedure: ESOPHAGEAL DILATION;  Surgeon: Malissa Hippo, MD;  [facility];  Service: Endoscopy;  Laterality: N/A;   ESOPHAGEAL DILATION N/A 09/11/2020   Procedure: ESOPHAGEAL DILATION;  Surgeon: Malissa Hippo, MD;  [facility];  Service: Endoscopy;  Laterality: N/A;   ESOPHAGOGASTRODUODENOSCOPY  01/2007   Dr Jarold Motto- 3 cm hiatal hernia, benign esophageal biopsies, erosive esophagitis, gastritis   ESOPHAGOGASTRODUODENOSCOPY N/A 06/07/2014   Procedure: ESOPHAGOGASTRODUODENOSCOPY (EGD);  Surgeon: Malissa Hippo, MD;  [facility];  Service: Endoscopy;  Laterality: N/A;  1200   ESOPHAGOGASTRODUODENOSCOPY N/A 08/18/2017   Procedure: ESOPHAGOGASTRODUODENOSCOPY (EGD);  Surgeon: Malissa Hippo, MD;  [facility];  Service: Endoscopy;  Laterality: N/A;  2:20   ESOPHAGOGASTRODUODENOSCOPY (EGD) WITH PROPOFOL N/A 09/11/2020   Procedure: ESOPHAGOGASTRODUODENOSCOPY (EGD) WITH PROPOFOL;  Surgeon: Malissa Hippo, MD;  [facility];  Service: Endoscopy;  Laterality: N/A;   FOOT SURGERY Left 2005   hammer toe   INNER EAR SURGERY     Left   KNEE SURGERY Left    x 2   POLYPECTOMY  09/11/2020   Procedure: POLYPECTOMY;  Surgeon: Malissa Hippo, MD;  [facility];  Service: Endoscopy;;   SHOULDER ARTHROSCOPY WITH ROTATOR CUFF REPAIR Right 05/07/2021   Procedure: SHOULDER ARTHROSCOPY WITH MANIPULATION UNDER ANESTHESIA ,SUBACROMIAL DECOMPRESSION, DISTAL CLAVICLE RESECTION, ROTATOR CUFF REPAIR , BICEPS TENOTOMY;  Surgeon: Eugenia Mcalpine, MD;  Location: [facility];  Service: Orthopedics;   Laterality: Right;  WITH INTERSCALENE BLOCK NEEDS PRE OP CONSULT  WITH ANESTHESIA 120   SHOULDER SURGERY Left 2011   TONSILLECTOMY     age 19   TOTAL KNEE  ARTHROPLASTY Left 02/07/2018   Procedure: TOTAL KNEE ARTHROPLASTY;  Surgeon: Ollen Gross, MD;  Location: [facility];  Service: Orthopedics;  Laterality: Left;    TUBAL LIGATION  1970   with an appy   TYMPANOSTOMY TUBE PLACEMENT  1984   VAGINAL HYSTERECTOMY  1970    Current Outpatient Medications  Medication Sig Dispense Refill   acetaminophen (TYLENOL) 500 MG tablet Take 1,000 mg by mouth every 6 (six) hours as needed for moderate pain.     acidophilus (RISAQUAD) CAPS capsule Take 1 capsule by mouth 4 (four) times a week.     albuterol (VENTOLIN HFA) 108 (90 Base) MCG/ACT inhaler Inhale 2 puffs into the lungs every 6 (six) hours as needed for wheezing. 1 each 2   Ascorbic Acid (VITAMIN C PO) Take 1 tablet by mouth 4 (four) times a week.     b complex vitamins capsule Take 1 capsule by mouth 4 (four) times a week.     dicyclomine (BENTYL) 10 MG capsule Take 1 capsule (10 mg total) by mouth 3 (three) times daily as needed. For stomach cramps 90 capsule 5   famotidine (PEPCID) 40 MG tablet Take 1 tablet (40 mg total) by mouth daily as needed. 90 tablet 1   FLUoxetine (PROZAC) 10 MG capsule TAKE 2 CAPSULES BY MOUTH TWICE DAILY 120 capsule 3   fluticasone (CUTIVATE) 0.05 % cream Apply 1 application  topically daily as needed (irritation).     ketoconazole (NIZORAL) 2 % cream Apply 1 application topically daily. (Patient taking differently: Apply 1 application  topically daily as needed for irritation.) 60 g 5   lidocaine-prilocaine (EMLA) cream Apply 1 Application topically as needed. 30 g 2   nitroGLYCERIN (NITROSTAT) 0.4 MG SL tablet as needed.     omeprazole (PRILOSEC) 40 MG capsule Take 1 capsule (40 mg total) by mouth daily. 90 capsule 3   ondansetron (ZOFRAN) 4 MG tablet Take 1 tablet (4 mg total) by mouth every 8 (eight) hours  as needed for nausea or vomiting. 20 tablet 0   oxyCODONE (ROXICODONE) 5 MG immediate release tablet 1/2 to 1 q 6 hours prn pain 10 tablet 0   Polyvinyl Alcohol (LUBRICANT DROPS OP) Place 2 drops into both eyes 3 (three) times daily as needed (for dry eyes).     No current facility-administered medications for this visit.    Allergies as of 11/17/2022 - Review Complete 11/17/2022  Allergen Reaction Noted   Blood-group specific substance Other (See Comments) 02/16/2012   Ciprofloxacin Other (See Comments) 02/11/2009   Codeine Nausea And Vomiting 02/11/2009   Latex Other (See Comments) 08/16/2017   Sertraline hcl Other (See Comments) 05/10/2020   Vicodin [hydrocodone-acetaminophen] Nausea And Vomiting 09/23/2015   Tramadol Swelling, Palpitations, and Hypertension 01/23/2015    Family History  Problem Relation Age of Onset   Paranoid behavior Mother        depression   Colon polyps Mother    Eczema Mother    Depression Mother    Diabetes Father    Heart attack Father        11's   Lung cancer Father    Cirrhosis Father 13       etoh cirrhosis   Eczema Father    Lung cancer Sister    Depression Sister    Alzheimer's disease Sister    Hypertension Sister    Diabetes Sister    Lung cancer Sister    Sleep apnea Sister    Depression Maternal Aunt  COPD Daughter    Drug abuse Niece    Suicidality Niece    Allergic rhinitis Neg Hx    Asthma Neg Hx    Urticaria Neg Hx     Social History   Socioeconomic History   Marital status: Married    Spouse name: Ruth [name]   Number of children: 2   Years of education: Not on file   Highest education level: Some college, no degree  Occupational History   Occupation: Retired; Engineer, agricultural  Tobacco Use   Smoking status: Never    Passive exposure: Past   Smokeless tobacco: Never  Vaping Use   Vaping status: Never Used  Substance and Sexual Activity   Alcohol use: Yes    Alcohol/week: 0.0 standard drinks of alcohol    Comment:  per patient very seldom   Drug use: No   Sexual activity: Not Currently    Partners: Male    Comment: Hysterectomy  Other Topics Concern   Not on file  Social History Narrative   Married 2 grown children, 1 adopted son-autistic-lives next door.   1 granddaughter   2 step grandsons   Social Determinants of Health   Financial Resource Strain: Patient Declined (05/14/2022)   Overall Financial Resource Strain (CARDIA)    Difficulty of Paying Living Expenses: Patient declined  Food Insecurity: Patient Declined (05/14/2022)   Hunger Vital Sign    Worried About Running Out of Food in the Last Year: Patient declined    Ran Out of Food in the Last Year: Patient declined  Transportation Needs: Patient Declined (05/14/2022)   PRAPARE - Administrator, Civil Service (Medical): Patient declined    Lack of Transportation (Non-Medical): Patient declined  Physical Activity: Inactive (05/14/2022)   Exercise Vital Sign    Days of Exercise per Week: 0 days    Minutes of Exercise per Session: 30 min  Stress: Stress Concern Present (05/14/2022)   [employer] of Occupational Health - Occupational Stress Questionnaire    Feeling of Stress : Rather much  Social Connections: Socially Integrated (05/14/2022)   Social Connection and Isolation Panel [NHANES]    Frequency of Communication with Friends and Family: More than three times a week    Frequency of Social Gatherings with Friends and Family: More than three times a week    Attends Religious Services: More than 4 times per year    Active Member of [employer] or Organizations: Yes    Attends Banker Meetings: 1 to 4 times per year    Marital Status: Married    Review of systems General: negative for malaise, night sweats, fever, chills, +weight loss  Neck: Negative for lumps, goiter, pain and significant neck swelling Resp: Negative for cough, wheezing, dyspnea at rest CV: Negative for chest pain, leg swelling, palpitations,  orthopnea GI: denies melena, hematochezia, nausea, vomiting, diarrhea, constipation, dysphagia, odyonophagia, early satiety +weight loss +epigastric pain  MSK: Negative for joint pain or swelling, back pain, and muscle pain. Derm: Negative for itching or rash Psych: Denies depression, anxiety, memory loss, confusion. No homicidal or suicidal ideation.  Heme: Negative for prolonged bleeding, bruising easily, and swollen nodes. Endocrine: Negative for cold or heat intolerance, polyuria, polydipsia and goiter. Neuro: negative for tremor, gait imbalance, syncope and seizures. The remainder of the review of systems is noncontributory.  Physical Exam: BP 116/73 (BP Location: Left Arm, Patient Position: Sitting, Cuff Size: Large)   Pulse 76   Temp (!) 97.1 F (36.2 C) (Temporal)  Ht 5\' 4"  (1.626 m)   Wt 183 lb 8 oz (83.2 kg)   BMI 31.50 kg/m  General:   Alert and oriented. No distress noted. Pleasant and cooperative.  Head:  Normocephalic and atraumatic. Eyes:  Conjuctiva clear without scleral icterus. Mouth:  Oral mucosa pink and moist. Good dentition. No lesions. Heart: Normal rate and rhythm, s1 and s2 heart sounds present.  Lungs: Clear lung sounds in all lobes. Respirations equal and unlabored. Abdomen:  +BS, soft, and non-distended. TTP of diffuse upper abdomen. No rebound or guarding. No HSM or masses noted. Derm: No palmar erythema or jaundice Msk:  Symmetrical without gross deformities. Normal posture. Extremities:  Without edema. Neurologic:  Alert and  oriented x4 Psych:  Alert and cooperative. Normal mood and affect.  Invalid input(s): "6 MONTHS"   ASSESSMENT: LASTACIA SOLUM is a 82 y.o. female presenting today for follow up of epigastric pain  Epigastric pain for which patient underwent EGD with findings of erosive gastropathy and nodule in stomach. She was started on omeprazole 40mg  daily, notes little improvement in her epigastric pain. Has upcoming EUS with Dr.  Meridee Score. Denies early satiety, eating can improve pain at times, denies worsening of pain post prandially, no melena, has had some weight loss, unclear if this is due strict from dietary changes or not. Etiology of her pain is unclear, differentials include: pain secondary to her erosive gastropathy, though would expect some improvement with PPI therapy, functional pain, cannot rule out malignancy given some weight loss, though I think this is less likely, as she has had no cross sectional imaging of her abdomen, will obtain CT A/P for further evaluation of her ongoing pain.    PLAN:  Continue omeprazole 40mg  daily  2. CT A/P with contrast  3. Keep scheduled EUS in December   All questions were answered, patient verbalized understanding and is in agreement with plan as outlined above.   Follow Up: 3 months   Devontaye Ground L. Jeanmarie Hubert, MSN, APRN, AGNP-C Adult-Gerontology Nurse Practitioner [facility]  I have reviewed the note and agree with the APP's assessment as described in this progress note  Katrinka Blazing, MD Gastroenterology and Hepatology [facility]

## 2022-11-18 ENCOUNTER — Other Ambulatory Visit: Payer: Self-pay | Admitting: Family Medicine

## 2022-11-18 ENCOUNTER — Telehealth (INDEPENDENT_AMBULATORY_CARE_PROVIDER_SITE_OTHER): Payer: Self-pay | Admitting: Gastroenterology

## 2022-11-18 MED ORDER — OXYCODONE HCL 5 MG PO TABS: ORAL_TABLET | ORAL | 0 refills | Status: DC

## 2022-11-18 NOTE — Telephone Encounter (Signed)
No PA needed for CT per [facility]. Left message to return call confirming that pt wants CT done at [facility]. If so I will need to fax order over to [facility].

## 2022-11-18 NOTE — Telephone Encounter (Signed)
I sent in a short prescription please keep follow-up visit in November Has been more than 3 months since last being seen therefore cannot send larger quantity for now

## 2022-11-19 ENCOUNTER — Encounter (HOSPITAL_COMMUNITY): Payer: Self-pay | Admitting: Gastroenterology

## 2022-11-20 ENCOUNTER — Encounter: Payer: Self-pay | Admitting: *Deleted

## 2022-11-20 NOTE — Telephone Encounter (Signed)
Provider recommendations sent to patient via my chart 

## 2022-11-24 ENCOUNTER — Telehealth (INDEPENDENT_AMBULATORY_CARE_PROVIDER_SITE_OTHER): Payer: Self-pay

## 2022-11-24 NOTE — Telephone Encounter (Signed)
Pt would like CT done at [facility]. Willl get order signed and will fax to [facility]

## 2022-11-24 NOTE — Telephone Encounter (Signed)
Patient returning call regarding Ct . Please call at [phone] thanks

## 2022-12-03 DIAGNOSIS — M1711 Unilateral primary osteoarthritis, right knee: Secondary | ICD-10-CM | POA: Diagnosis not present

## 2022-12-04 ENCOUNTER — Ambulatory Visit
Admission: RE | Admit: 2022-12-04 | Discharge: 2022-12-04 | Disposition: A | Discharge status: Home / Self Care | Payer: No Typology Code available for payment source | Source: Ambulatory Visit | Attending: Gastroenterology | Admitting: Gastroenterology

## 2022-12-04 DIAGNOSIS — K219 Gastro-esophageal reflux disease without esophagitis: Secondary | ICD-10-CM | POA: Diagnosis not present

## 2022-12-04 DIAGNOSIS — R1013 Epigastric pain: Secondary | ICD-10-CM

## 2022-12-04 MED ORDER — IOPAMIDOL (ISOVUE-300) INJECTION 61%
100.0000 mL | Freq: Once | INTRAVENOUS | Status: AC | PRN
  Administered 2022-12-04: 100 mL via INTRAVENOUS

## 2022-12-08 NOTE — Telephone Encounter (Signed)
Pt has CT scan on 1018/24

## 2022-12-09 ENCOUNTER — Telehealth: Payer: Self-pay | Admitting: *Deleted

## 2022-12-09 NOTE — Telephone Encounter (Signed)
Pt has been scheduled tele pre op appt 01/08/23. Med rec and consent are done.

## 2022-12-09 NOTE — Telephone Encounter (Signed)
Pt has been scheduled tele pre op appt 01/08/23. Med rec and consent are done.     Patient Consent for Virtual Visit        Ruth [name] has provided verbal consent on 12/09/2022 for a virtual visit (video or telephone).   CONSENT FOR VIRTUAL VISIT FOR:  Ruth [name]  By participating in this virtual visit I agree to the following:  I hereby voluntarily request, consent and authorize [facility] and its employed or contracted physicians, physician assistants, nurse practitioners or other licensed health care professionals (the Practitioner), to provide me with telemedicine health care services (the "Services") as deemed necessary by the treating Practitioner. I acknowledge and consent to receive the Services by the Practitioner via telemedicine. I understand that the telemedicine visit will involve communicating with the Practitioner through live audiovisual communication technology and the disclosure of certain medical information by electronic transmission. I acknowledge that I have been given the opportunity to request an in-person assessment or other available alternative prior to the telemedicine visit and am voluntarily participating in the telemedicine visit.  I understand that I have the right to withhold or withdraw my consent to the use of telemedicine in the course of my care at any time, without affecting my right to future care or treatment, and that the Practitioner or I may terminate the telemedicine visit at any time. I understand that I have the right to inspect all information obtained and/or recorded in the course of the telemedicine visit and may receive copies of available information for a reasonable fee.  I understand that some of the potential risks of receiving the Services via telemedicine include:  Delay or interruption in medical evaluation due to technological equipment failure or disruption; Information transmitted may not be sufficient (e.g. poor  resolution of images) to allow for appropriate medical decision making by the Practitioner; and/or  In rare instances, security protocols could fail, causing a breach of personal health information.  Furthermore, I acknowledge that it is my responsibility to provide information about my medical history, conditions and care that is complete and accurate to the best of my ability. I acknowledge that Practitioner's advice, recommendations, and/or decision may be based on factors not within their control, such as incomplete or inaccurate data provided by me or distortions of diagnostic images or specimens that may result from electronic transmissions. I understand that the practice of medicine is not an exact science and that Practitioner makes no warranties or guarantees regarding treatment outcomes. I acknowledge that a copy of this consent can be made available to me via my patient portal [facility] MyChart), or I can request a printed copy by calling the office of  [facility].    I understand that my insurance will be billed for this visit.   I have read or had this consent read to me. I understand the contents of this consent, which adequately explains the benefits and risks of the Services being provided via telemedicine.  I have been provided ample opportunity to ask questions regarding this consent and the Services and have had my questions answered to my satisfaction. I give my informed consent for the services to be provided through the use of telemedicine in my medical care

## 2022-12-09 NOTE — Telephone Encounter (Signed)
   Name: Ruth [name]  DOB: 24-Aug-1940  MRN: [id number]  Primary Cardiologist: Charlton Haws, MD   Preoperative team, please contact this patient and set up a phone call appointment for further preoperative risk assessment. Please obtain consent and complete medication review. Thank you for your help.  I confirm that guidance regarding antiplatelet and oral anticoagulation therapy has been completed and, if necessary, noted below.   None requested   I also confirmed the patient resides in the state of [place]. As per [facility] Medical Board telemedicine laws, the patient must reside in the state in which the provider is licensed.   Ronney Asters, NP 12/09/2022, 3:59 PM [facility]

## 2022-12-09 NOTE — Telephone Encounter (Signed)
   Pre-operative Risk Assessment    Patient Name: Ruth [name]  DOB: 03-12-1940 MRN: [id number]    DATE OF LAST VISIT: 10/08/22 DR. NISHAN DATE OF NEXT VISIT: NONE  Request for Surgical Clearance    Procedure:   RIGHT REVERSE SHOULDER ARTHROPLASTY  Date of Surgery:  Clearance 01/28/23                                 Surgeon:  DR. Caryn Bee SUPPLE Surgeon's Group or Practice Name:  [facility] Phone number:  [phone] ATTN: Aida Raider Fax number:  [phone]   Type of Clearance Requested:   - Medical ; NONE INDICATED AS NEEDING TO BE HELD   Type of Anesthesia:  General    Additional requests/questions:    [name]   12/09/2022, 3:39 PM

## 2022-12-10 DIAGNOSIS — M1711 Unilateral primary osteoarthritis, right knee: Secondary | ICD-10-CM | POA: Diagnosis not present

## 2022-12-17 DIAGNOSIS — M1711 Unilateral primary osteoarthritis, right knee: Secondary | ICD-10-CM | POA: Diagnosis not present

## 2022-12-21 ENCOUNTER — Telehealth (INDEPENDENT_AMBULATORY_CARE_PROVIDER_SITE_OTHER): Payer: Self-pay | Admitting: *Deleted

## 2022-12-21 NOTE — Telephone Encounter (Signed)
Pt called for results of ct done 10/18. Report not finalized yet. I called reading room and spoke to Gabe who was going to get it read for patient. I called pt and let her know I would follow up on this tomorrow.  [phone]

## 2022-12-22 NOTE — Telephone Encounter (Signed)
Pt had ct oct 18 and called yeterday for results. I called reading room since it had not resulted. It is now finalized.

## 2022-12-22 NOTE — Telephone Encounter (Signed)
Pt given results. Thanks

## 2023-01-06 DIAGNOSIS — H52223 Regular astigmatism, bilateral: Secondary | ICD-10-CM | POA: Diagnosis not present

## 2023-01-06 DIAGNOSIS — H524 Presbyopia: Secondary | ICD-10-CM | POA: Diagnosis not present

## 2023-01-07 ENCOUNTER — Encounter (HOSPITAL_COMMUNITY): Payer: Self-pay | Admitting: Gastroenterology

## 2023-01-07 NOTE — Progress Notes (Signed)
Pre op call eval Name:Ruth Lucy Chris MD Cardiology-Nishan MD  EKG-10/08/22 Echo-08/11/21 Cath-n/a Stress-n/a ICD/PM- n/a Blood thinner-n/a GLP-1- n/a  Hx:Pre DM, Asthma, OSA, GERD. Patient has cardiac clearance call 11/22, denies any issues presently. Does endorse some issues with anesthesia. Pt states one procedure woke up during, the next they had a hard time waking her up, also noted to have laryngospasms during anesthesia.  Anesthesia Review: Yes

## 2023-01-08 ENCOUNTER — Ambulatory Visit: Payer: No Typology Code available for payment source

## 2023-01-08 NOTE — Telephone Encounter (Signed)
Called pt to reschedule tele visit due to preop provider being out off the office. Pt states that surgery has been cancelled and she would call us back when she gets a new date

## 2023-01-13 ENCOUNTER — Ambulatory Visit: Payer: No Typology Code available for payment source | Admitting: Family Medicine

## 2023-01-13 ENCOUNTER — Encounter: Payer: Self-pay | Admitting: Family Medicine

## 2023-01-13 VITALS — BP 122/71 | HR 79 | Temp 97.3°F | Ht 64.0 in | Wt 185.0 lb

## 2023-01-13 DIAGNOSIS — R7303 Prediabetes: Secondary | ICD-10-CM | POA: Diagnosis not present

## 2023-01-13 DIAGNOSIS — E785 Hyperlipidemia, unspecified: Secondary | ICD-10-CM

## 2023-01-13 DIAGNOSIS — M797 Fibromyalgia: Secondary | ICD-10-CM | POA: Diagnosis not present

## 2023-01-13 DIAGNOSIS — M7918 Myalgia, other site: Secondary | ICD-10-CM

## 2023-01-13 MED ORDER — FLUOXETINE HCL 20 MG PO CAPS: ORAL_CAPSULE | ORAL | 1 refills | Status: DC

## 2023-01-13 NOTE — Progress Notes (Unsigned)
   Subjective:    [name], female    DOB: 1940/12/10, 82 y.o.   MRN: [id number]  HPI Very nice patient Hyperlipidemia, unspecified hyperlipidemia type  Prediabetes  Musculoskeletal pain  Fibromyalgia She is under a lot of stress between her husband having some health issues and her son had some issues She is doing the best she can with things  Previous labs from July were reviewed Patient tried to do the best she can with healthy eating She had put off her shoulder surgery but she hopes to get it done possibly in January   Review of Systems     Objective:   Physical Exam General-in no acute distress Eyes-no discharge Lungs-respiratory rate normal, CTA CV-no murmurs,RRR Extremities skin warm dry no edema Neuro grossly normal Behavior normal, alert        Assessment & Plan:  1. Hyperlipidemia, unspecified hyperlipidemia type Cannot tolerate statins Check lab work within the next 30 days   2. Prediabetes Portion control regular physical activity  3. Musculoskeletal pain Uses oxycodone sparingly for her lumbar pain with sciatica tries to do some light exercise Patient realizes that oxycodone is for rare use  4. Fibromyalgia Gentle stretches massage gentle activity  Anxiety continues Prozac 40 mg daily she will let us know if she feels like she needs to go up  Patient relates she will do shoulder surgery possibly early January

## 2023-01-18 ENCOUNTER — Other Ambulatory Visit: Payer: Self-pay

## 2023-01-18 ENCOUNTER — Encounter (HOSPITAL_COMMUNITY)
Admission: RE | Disposition: A | Discharge status: Home / Self Care | Payer: Self-pay | Source: Home / Self Care | Attending: Gastroenterology

## 2023-01-18 ENCOUNTER — Ambulatory Visit (HOSPITAL_COMMUNITY): Payer: No Typology Code available for payment source

## 2023-01-18 ENCOUNTER — Ambulatory Visit (HOSPITAL_COMMUNITY)
Admission: RE | Admit: 2023-01-18 | Discharge: 2023-01-18 | Disposition: A | Discharge status: Home / Self Care | Payer: No Typology Code available for payment source | Attending: Gastroenterology | Admitting: Gastroenterology

## 2023-01-18 ENCOUNTER — Encounter (HOSPITAL_COMMUNITY): Payer: Self-pay | Admitting: Gastroenterology

## 2023-01-18 DIAGNOSIS — I899 Noninfective disorder of lymphatic vessels and lymph nodes, unspecified: Secondary | ICD-10-CM | POA: Diagnosis not present

## 2023-01-18 DIAGNOSIS — J45909 Unspecified asthma, uncomplicated: Secondary | ICD-10-CM | POA: Diagnosis not present

## 2023-01-18 DIAGNOSIS — R1310 Dysphagia, unspecified: Secondary | ICD-10-CM | POA: Diagnosis not present

## 2023-01-18 DIAGNOSIS — E785 Hyperlipidemia, unspecified: Secondary | ICD-10-CM

## 2023-01-18 DIAGNOSIS — K449 Diaphragmatic hernia without obstruction or gangrene: Secondary | ICD-10-CM | POA: Insufficient documentation

## 2023-01-18 DIAGNOSIS — R933 Abnormal findings on diagnostic imaging of other parts of digestive tract: Secondary | ICD-10-CM | POA: Diagnosis not present

## 2023-01-18 DIAGNOSIS — Z8711 Personal history of peptic ulcer disease: Secondary | ICD-10-CM | POA: Diagnosis not present

## 2023-01-18 DIAGNOSIS — K3189 Other diseases of stomach and duodenum: Secondary | ICD-10-CM

## 2023-01-18 DIAGNOSIS — Q399 Congenital malformation of esophagus, unspecified: Secondary | ICD-10-CM | POA: Diagnosis not present

## 2023-01-18 DIAGNOSIS — K319 Disease of stomach and duodenum, unspecified: Secondary | ICD-10-CM

## 2023-01-18 DIAGNOSIS — K2289 Other specified disease of esophagus: Secondary | ICD-10-CM | POA: Diagnosis not present

## 2023-01-18 DIAGNOSIS — G4730 Sleep apnea, unspecified: Secondary | ICD-10-CM | POA: Diagnosis not present

## 2023-01-18 DIAGNOSIS — R7303 Prediabetes: Secondary | ICD-10-CM

## 2023-01-18 HISTORY — PX: ESOPHAGOGASTRODUODENOSCOPY (EGD) WITH PROPOFOL: SHX5813

## 2023-01-18 HISTORY — PX: BIOPSY: SHX5522

## 2023-01-18 HISTORY — PX: EUS: SHX5427

## 2023-01-18 SURGERY — UPPER ENDOSCOPIC ULTRASOUND (EUS) RADIAL
Anesthesia: Monitor Anesthesia Care

## 2023-01-18 MED ORDER — PROPOFOL 500 MG/50ML IV EMUL
INTRAVENOUS | Status: DC | PRN
  Administered 2023-01-18: 125 ug/kg/min via INTRAVENOUS

## 2023-01-18 MED ORDER — PROPOFOL 10 MG/ML IV BOLUS
INTRAVENOUS | Status: DC | PRN
  Administered 2023-01-18 (×2): 30 mg via INTRAVENOUS

## 2023-01-18 MED ORDER — GLYCOPYRROLATE 0.2 MG/ML IJ SOLN
INTRAMUSCULAR | Status: DC | PRN
  Administered 2023-01-18: .2 mg via INTRAVENOUS

## 2023-01-18 MED ORDER — PROPOFOL 1000 MG/100ML IV EMUL
INTRAVENOUS | Status: AC
  Filled 2023-01-18: qty 100

## 2023-01-18 MED ORDER — LIDOCAINE HCL (CARDIAC) PF 100 MG/5ML IV SOSY
PREFILLED_SYRINGE | INTRAVENOUS | Status: DC | PRN
  Administered 2023-01-18: 50 mg via INTRAVENOUS

## 2023-01-18 MED ORDER — SODIUM CHLORIDE 0.9 % IV SOLN: INTRAVENOUS | Status: DC

## 2023-01-18 NOTE — Progress Notes (Signed)
Pt is jehovah's witness.  Pt has signed blood refusal form

## 2023-01-18 NOTE — H&P (Signed)
GASTROENTEROLOGY PROCEDURE H&P NOTE   Primary Care Physician: Babs Sciara, MD  HPI: Ruth [name] is a 82 y.o. female who presents for EGD/EUS to evaluate a gastric nodule.  Past Medical History:  Diagnosis Date   Anemia    Anxiety    Arthritis    Asthma    Complication of anesthesia    woke up during surgery and hard to wake up, laryngospasms   Depression    Eczema    Fatty liver    Fibromyalgia    GERD (gastroesophageal reflux disease)    EGD/ colon 1/09   H pylori ulcer 1980-1990   s/p treatment   History of kidney stones    Hyperlipidemia    IBS (irritable bowel syndrome)    Kidney cysts    Laryngospasm    Laryngospasms    with anesthesia   Neck pain    OSA (obstructive sleep apnea)    pt havin gsleep study  late part of 09/2022   Pneumonia 2013   PONV (postoperative nausea and vomiting)    Pre-diabetes    Refusal of blood transfusions as patient is Jehovah's Witness    Past Surgical History:  Procedure Laterality Date   BILATERAL SALPINGOOPHORECTOMY  1990s   abdominal   BIOPSY  09/11/2020   Procedure: BIOPSY;  Surgeon: Malissa Hippo, MD;  [facility];  Service: Endoscopy;;  gastric   BIOPSY  11/11/2022   Procedure: BIOPSY;  Surgeon: Dolores Frame, MD;  [facility]: [facility];  Service: Gastroenterology;;   CARDIAC CATHETERIZATION  2002   [facility]) normal coronary arteries    COLONOSCOPY  01/2007   Dr. Laurie Panda   COLONOSCOPY N/A 04/18/2015   Procedure: COLONOSCOPY;  Surgeon: Malissa Hippo, MD;  [facility]: [facility];  Service: Endoscopy;  Laterality: N/A;  830 - moved to 8:55 - Ann to notify pt   COLONOSCOPY WITH ESOPHAGOGASTRODUODENOSCOPY (EGD)  02/16/2012   Procedure: COLONOSCOPY WITH ESOPHAGOGASTRODUODENOSCOPY (EGD);  Surgeon: Corbin Ade, MD;  [facility];  Service: Endoscopy;  Laterality: N/A;  8:45   COLONOSCOPY WITH PROPOFOL N/A 09/11/2020   Procedure: COLONOSCOPY WITH PROPOFOL;  Surgeon:  Malissa Hippo, MD;  [facility];  Service: Endoscopy;  Laterality: N/A;  10:55   ESOPHAGEAL DILATION N/A 06/07/2014   Procedure: ESOPHAGEAL DILATION;  Surgeon: Malissa Hippo, MD;  [facility];  Service: Endoscopy;  Laterality: N/A;   ESOPHAGEAL DILATION N/A 09/11/2020   Procedure: ESOPHAGEAL DILATION;  Surgeon: Malissa Hippo, MD;  [facility];  Service: Endoscopy;  Laterality: N/A;   ESOPHAGOGASTRODUODENOSCOPY  01/2007   Dr Jarold Motto- 3 cm hiatal hernia, benign esophageal biopsies, erosive esophagitis, gastritis   ESOPHAGOGASTRODUODENOSCOPY N/A 06/07/2014   Procedure: ESOPHAGOGASTRODUODENOSCOPY (EGD);  Surgeon: Malissa Hippo, MD;  [facility];  Service: Endoscopy;  Laterality: N/A;  1200   ESOPHAGOGASTRODUODENOSCOPY N/A 08/18/2017   Procedure: ESOPHAGOGASTRODUODENOSCOPY (EGD);  Surgeon: Malissa Hippo, MD;  [facility];  Service: Endoscopy;  Laterality: N/A;  2:20   ESOPHAGOGASTRODUODENOSCOPY (EGD) WITH PROPOFOL N/A 09/11/2020   Procedure: ESOPHAGOGASTRODUODENOSCOPY (EGD) WITH PROPOFOL;  Surgeon: Malissa Hippo, MD;  [facility];  Service: Endoscopy;  Laterality: N/A;   ESOPHAGOGASTRODUODENOSCOPY (EGD) WITH PROPOFOL N/A 11/11/2022   Procedure: ESOPHAGOGASTRODUODENOSCOPY (EGD) WITH PROPOFOL;  Surgeon: Dolores Frame, MD;  [facility];  Service: Gastroenterology;  Laterality: N/A;  7:30am;asa 3   FOOT SURGERY Left 2005   hammer  toe   INNER EAR SURGERY     Left   KNEE SURGERY Left    x 2   POLYPECTOMY  09/11/2020   Procedure: POLYPECTOMY;  Surgeon: Malissa Hippo, MD;  [facility];  Service: Endoscopy;;   SHOULDER ARTHROSCOPY WITH ROTATOR CUFF REPAIR Right 05/07/2021   Procedure: SHOULDER ARTHROSCOPY WITH MANIPULATION UNDER ANESTHESIA ,SUBACROMIAL DECOMPRESSION, DISTAL CLAVICLE RESECTION, ROTATOR CUFF REPAIR , BICEPS TENOTOMY;  Surgeon: Eugenia Mcalpine, MD;  Location: [facility];   Service: Orthopedics;  Laterality: Right;  WITH INTERSCALENE BLOCK NEEDS PRE OP CONSULT  WITH ANESTHESIA 120   SHOULDER SURGERY Left 2011   TONSILLECTOMY     age 62   TOTAL KNEE ARTHROPLASTY Left 02/07/2018   Procedure: TOTAL KNEE ARTHROPLASTY;  Surgeon: Ollen Gross, MD;  Location: [facility];  Service: Orthopedics;  Laterality: Left;    TUBAL LIGATION  1970   with an appy   TYMPANOSTOMY TUBE PLACEMENT  1984   VAGINAL HYSTERECTOMY  1970   No current facility-administered medications for this encounter.   No current facility-administered medications for this encounter. Allergies  Allergen Reactions   Blood-Group Specific Substance Other (See Comments)    NO BLOOD PRODUCTS-refuses transfusions   Ciprofloxacin Other (See Comments)    Extreme fatigue   Codeine Nausea And Vomiting   Latex Other (See Comments)    Red and raw area around incision   Sertraline Hcl Other (See Comments)    Made patient a "zombie"   Vicodin [Hydrocodone-Acetaminophen] Nausea And Vomiting   Tramadol Swelling, Palpitations and Hypertension    Increased heart rate,increased blood pressure, edema   Family History  Problem Relation Age of Onset   Paranoid behavior Mother        depression   Colon polyps Mother    Eczema Mother    Depression Mother    Diabetes Father    Heart attack Father        26's   Lung cancer Father    Cirrhosis Father 62       etoh cirrhosis   Eczema Father    Lung cancer Sister    Depression Sister    Alzheimer's disease Sister    Hypertension Sister    Diabetes Sister    Lung cancer Sister    Sleep apnea Sister    Depression Maternal Aunt    COPD Daughter    Drug abuse Niece    Suicidality Niece    Allergic rhinitis Neg Hx    Asthma Neg Hx    Urticaria Neg Hx    Social History   Socioeconomic History   Marital status: Married    Spouse name: Molly Maduro   Number of children: 2   Years of education: Not on file   Highest education level: Some college, no  degree  Occupational History   Occupation: Retired; Engineer, agricultural  Tobacco Use   Smoking status: Never    Passive exposure: Past   Smokeless tobacco: Never  Vaping Use   Vaping status: Never Used  Substance and Sexual Activity   Alcohol use: Yes    Alcohol/week: 0.0 standard drinks of alcohol    Comment: per patient very seldom   Drug use: No   Sexual activity: Not Currently    Partners: Male    Comment: Hysterectomy  Other Topics Concern   Not on file  Social History Narrative   Married 2 grown children, 1 adopted son-autistic-lives next door.   1 granddaughter   2 step grandsons  Social Determinants of Health   Financial Resource Strain: Patient Declined (05/14/2022)   Overall Financial Resource Strain (CARDIA)    Difficulty of Paying Living Expenses: Patient declined  Food Insecurity: Patient Declined (05/14/2022)   Hunger Vital Sign    Worried About Running Out of Food in the Last Year: Patient declined    Ran Out of Food in the Last Year: Patient declined  Transportation Needs: Patient Declined (05/14/2022)   PRAPARE - Administrator, Civil Service (Medical): Patient declined    Lack of Transportation (Non-Medical): Patient declined  Physical Activity: Inactive (05/14/2022)   Exercise Vital Sign    Days of Exercise per Week: 0 days    Minutes of Exercise per Session: 30 min  Stress: Stress Concern Present (05/14/2022)   [employer] of Occupational Health - Occupational Stress Questionnaire    Feeling of Stress : Rather much  Social Connections: Socially Integrated (05/14/2022)   Social Connection and Isolation Panel [NHANES]    Frequency of Communication with Friends and Family: More than three times a week    Frequency of Social Gatherings with Friends and Family: More than three times a week    Attends Religious Services: More than 4 times per year    Active Member of [employer] or Organizations: Yes    Attends Banker Meetings: 1 to 4  times per year    Marital Status: Married  Catering manager Violence: Not At Risk (04/03/2022)   Humiliation, Afraid, Rape, and Kick questionnaire    Fear of Current or Ex-Partner: No    Emotionally Abused: No    Physically Abused: No    Sexually Abused: No    Physical Exam: Today's Vitals   01/07/23 1131  Weight: 83.2 kg   Body mass index is 31.48 kg/m. GEN: NAD EYE: Sclerae anicteric ENT: MMM CV: Non-tachycardic GI: Soft, NT/ND NEURO:  Alert & Oriented x 3  Lab Results: No results for input(s): "WBC", "HGB", "HCT", "PLT" in the last 72 hours. BMET No results for input(s): "NA", "K", "CL", "CO2", "GLUCOSE", "BUN", "CREATININE", "CALCIUM" in the last 72 hours. LFT No results for input(s): "PROT", "ALBUMIN", "AST", "ALT", "ALKPHOS", "BILITOT", "BILIDIR", "IBILI" in the last 72 hours. PT/INR No results for input(s): "LABPROT", "INR" in the last 72 hours.   Impression / Plan: This is a 82 y.o.female who presents for EGD/EUS to evaluate a gastric nodule.  The risks of an EUS including intestinal perforation, bleeding, infection, aspiration, and medication effects were discussed as was the possibility it may not give a definitive diagnosis if a biopsy is performed.  When a biopsy of the pancreas is done as part of the EUS, there is an additional risk of pancreatitis at the rate of about 1-2%.  It was explained that procedure related pancreatitis is typically mild, although it can be severe and even life threatening, which is why we do not perform random pancreatic biopsies and only biopsy a lesion/area we feel is concerning enough to warrant the risk.   The risks and benefits of endoscopic evaluation/treatment were discussed with the patient and/or family; these include but are not limited to the risk of perforation, infection, bleeding, missed lesions, lack of diagnosis, severe illness requiring hospitalization, as well as anesthesia and sedation related illnesses.  The patient's  history has been reviewed, patient examined, no change in status, and deemed stable for procedure.  The patient and/or family is agreeable to proceed.    Corliss Parish, MD [facility] Office #  [phone]  

## 2023-01-18 NOTE — Progress Notes (Signed)
Office Visit Note  Patient: Ruth [name]             Date of Birth: 03/05/1940           MRN: [id number]             PCP: Babs Sciara, MD Referring: Babs Sciara, MD Visit Date: 01/29/2023 Occupation: @GUAROCC @  Subjective:  Pain in multiple joints  History of Present Illness: Ruth [name] is a 82 y.o. female with osteoarthritis and fibromyalgia.  She returns today after her last visit in February 2024.  She continues to have pain and discomfort in her right shoulder joint.  She states she could not have right total shoulder replacement as her first appointment was canceled by the orthopedic office.  As she had to cancel the second appointment as her husband had a stroke.  She continues to have pain and discomfort in her bilateral hands which she describes most over the [facility] joints.  She continues to have right knee joint pain which will require total knee replacement.  Her left total knee replacement is doing well.  She has some numbness over the lateral aspect of her calf which could be related to the surgery per patient.  She has been having flares of fibromyalgia with generalized pain and discomfort.  She had a rash on her scalp and extremities.  She states she was seen at [name] by Dr. Dareen Piano and was diagnosed with psoriasis.  She used topical agents but it is hard for her to remember.  She has had problems with plantar fasciitis in the past.  She denies any episodes of Achilles tendinitis.  No history of uveitis.    Activities of Daily Living:  Patient reports morning stiffness for several hours.   Patient Reports nocturnal pain.  Difficulty dressing/grooming: Reports Difficulty climbing stairs: Reports Difficulty getting out of chair: Reports Difficulty using hands for taps, buttons, cutlery, and/or writing: Reports  Review of Systems  Constitutional:  Positive for fatigue.  HENT:  Negative for mouth sores and mouth dryness.   Eyes:  Positive for  dryness.  Respiratory:  Negative for shortness of breath.   Cardiovascular:  Positive for palpitations. Negative for chest pain.  Gastrointestinal:  Positive for constipation and diarrhea. Negative for blood in stool.  Endocrine: Negative for increased urination.  Genitourinary:  Negative for involuntary urination.  Musculoskeletal:  Positive for joint pain, gait problem, joint pain, joint swelling, myalgias, muscle weakness, morning stiffness, muscle tenderness and myalgias.  Skin:  Positive for rash and hair loss. Negative for color change and sensitivity to sunlight.  Allergic/Immunologic: Negative for susceptible to infections.  Neurological:  Negative for dizziness and headaches.  Hematological:  Positive for swollen glands.  Psychiatric/Behavioral:  Positive for depressed mood and sleep disturbance. The patient is nervous/anxious.     PMFS History:  Patient Active Problem List   Diagnosis Date Noted   Gastric nodule 01/18/2023   Nausea without vomiting 07/27/2022   Pain in joint of right shoulder 07/09/2020   Body mass index (BMI) 35.0-35.9, adult 06/06/2019   Cervical spondylosis with radiculopathy 06/06/2019   MDD (major depressive disorder), recurrent, in partial remission (HCC) 03/27/2019   Depression, major, single episode, mild (HCC) 03/07/2018   History of total knee replacement, left 02/21/2018   OA (osteoarthritis) of knee 02/07/2018   Non-allergic rhinitis 12/16/2017   Allergy to metal 12/16/2017   Pain in right foot 09/21/2017   Primary osteoarthritis of both feet 04/29/2017  Osteoarthritis of foot joint 04/29/2017   Hammer toe 04/23/2017   Family history of premature coronary heart disease 04/15/2016   Primary osteoarthritis of both knees 12/31/2015   Vulvodynia 08/03/2014   Anxiety disorder 07/06/2014   Fibromyalgia 04/25/2014   Prediabetes 03/18/2014   Hyperlipidemia 03/18/2014   Osteopenia 03/06/2014   History of cardiac catheterization 04/28/2013    Abdominal pain, epigastric 02/02/2012   GERD (gastroesophageal reflux disease) 02/02/2012   Indigestion 02/02/2012   Esophageal dysphagia 02/05/2011   CHEST PAIN 02/11/2009   IRRITABLE BOWEL SYNDROME 02/15/2007   Primary osteoarthritis of both hands 02/15/2007   Degenerative joint disease of hand 02/15/2007   GASTRITIS 02/14/2007    Past Medical History:  Diagnosis Date   Anemia    Anxiety    Arthritis    Asthma    Complication of anesthesia    woke up during surgery and hard to wake up, laryngospasms   Depression    Eczema    Fatty liver    Fibromyalgia    GERD (gastroesophageal reflux disease)    EGD/ colon 1/09   H pylori ulcer 1980-1990   s/p treatment   History of kidney stones    Hyperlipidemia    IBS (irritable bowel syndrome)    Kidney cysts    Laryngospasm    Neck pain    OSA (obstructive sleep apnea)    pt havin gsleep study  late part of 09/2022   Pneumonia 2013   PONV (postoperative nausea and vomiting)    Pre-diabetes    Psoriasis 2023   dx [facility] per patient   Refusal of blood transfusions as patient is Jehovah's Witness     Family History  Problem Relation Age of Onset   Paranoid behavior Mother        depression   Colon polyps Mother    Eczema Mother    Depression Mother    Diabetes Father    Heart attack Father        55's   Lung cancer Father    Cirrhosis Father 59       etoh cirrhosis   Eczema Father    Lung cancer Sister    Depression Sister    Alzheimer's disease Sister    Hypertension Sister    Diabetes Sister    Lung cancer Sister    Sleep apnea Sister    Depression Maternal Aunt    COPD Daughter    Drug abuse Niece    Suicidality Niece    Allergic rhinitis Neg Hx    Asthma Neg Hx    Urticaria Neg Hx    Past Surgical History:  Procedure Laterality Date   BILATERAL SALPINGOOPHORECTOMY  1990s   abdominal   BIOPSY  09/11/2020   Procedure: BIOPSY;  Surgeon: Malissa Hippo, MD;  [facility];   Service: Endoscopy;;  gastric   BIOPSY  11/11/2022   Procedure: BIOPSY;  Surgeon: Dolores Frame, MD;  [facility];  Service: Gastroenterology;;   BIOPSY  01/18/2023   Procedure: BIOPSY;  Surgeon: Lemar Lofty., MD;  Location: [facility];  Service: Gastroenterology;;   CARDIAC CATHETERIZATION  2002   [facility]) normal coronary arteries    COLONOSCOPY  01/2007   Dr. Laurie Panda   COLONOSCOPY N/A 04/18/2015   Procedure: COLONOSCOPY;  Surgeon: Malissa Hippo, MD;  [facility];  Service: Endoscopy;  Laterality: N/A;  830 - moved to 8:55 - Ann to notify pt   COLONOSCOPY WITH  ESOPHAGOGASTRODUODENOSCOPY (EGD)  02/16/2012   Procedure: COLONOSCOPY WITH ESOPHAGOGASTRODUODENOSCOPY (EGD);  Surgeon: Corbin Ade, MD;  [facility];  Service: Endoscopy;  Laterality: N/A;  8:45   COLONOSCOPY WITH PROPOFOL N/A 09/11/2020   Procedure: COLONOSCOPY WITH PROPOFOL;  Surgeon: Malissa Hippo, MD;  [facility];  Service: Endoscopy;  Laterality: N/A;  10:55   ESOPHAGEAL DILATION N/A 06/07/2014   Procedure: ESOPHAGEAL DILATION;  Surgeon: Malissa Hippo, MD;  [facility];  Service: Endoscopy;  Laterality: N/A;   ESOPHAGEAL DILATION N/A 09/11/2020   Procedure: ESOPHAGEAL DILATION;  Surgeon: Malissa Hippo, MD;  [facility];  Service: Endoscopy;  Laterality: N/A;   ESOPHAGOGASTRODUODENOSCOPY  01/2007   Dr Jarold Motto- 3 cm hiatal hernia, benign esophageal biopsies, erosive esophagitis, gastritis   ESOPHAGOGASTRODUODENOSCOPY N/A 06/07/2014   Procedure: ESOPHAGOGASTRODUODENOSCOPY (EGD);  Surgeon: Malissa Hippo, MD;  [facility];  Service: Endoscopy;  Laterality: N/A;  1200   ESOPHAGOGASTRODUODENOSCOPY N/A 08/18/2017   Procedure: ESOPHAGOGASTRODUODENOSCOPY (EGD);  Surgeon: Malissa Hippo, MD;  [facility];  Service: Endoscopy;  Laterality: N/A;  2:20   ESOPHAGOGASTRODUODENOSCOPY (EGD) WITH PROPOFOL  N/A 09/11/2020   Procedure: ESOPHAGOGASTRODUODENOSCOPY (EGD) WITH PROPOFOL;  Surgeon: Malissa Hippo, MD;  [facility];  Service: Endoscopy;  Laterality: N/A;   ESOPHAGOGASTRODUODENOSCOPY (EGD) WITH PROPOFOL N/A 11/11/2022   Procedure: ESOPHAGOGASTRODUODENOSCOPY (EGD) WITH PROPOFOL;  Surgeon: Dolores Frame, MD;  [facility];  Service: Gastroenterology;  Laterality: N/A;  7:30am;asa 3   ESOPHAGOGASTRODUODENOSCOPY (EGD) WITH PROPOFOL N/A 01/18/2023   Procedure: ESOPHAGOGASTRODUODENOSCOPY (EGD) WITH PROPOFOL;  Surgeon: Meridee Score Netty Starring., MD;  Location: [facility];  Service: Gastroenterology;  Laterality: N/A;   EUS N/A 01/18/2023   Procedure: UPPER ENDOSCOPIC ULTRASOUND (EUS) RADIAL;  Surgeon: Lemar Lofty., MD;  Location: [facility];  Service: Gastroenterology;  Laterality: N/A;   FOOT SURGERY Left 2005   hammer toe   INNER EAR SURGERY     Left   KNEE SURGERY Left    x 2   POLYPECTOMY  09/11/2020   Procedure: POLYPECTOMY;  Surgeon: Malissa Hippo, MD;  [facility];  Service: Endoscopy;;   SHOULDER ARTHROSCOPY WITH ROTATOR CUFF REPAIR Right 05/07/2021   Procedure: SHOULDER ARTHROSCOPY WITH MANIPULATION UNDER ANESTHESIA ,SUBACROMIAL DECOMPRESSION, DISTAL CLAVICLE RESECTION, ROTATOR CUFF REPAIR , BICEPS TENOTOMY;  Surgeon: Eugenia Mcalpine, MD;  Location: [facility];  Service: Orthopedics;  Laterality: Right;  WITH INTERSCALENE BLOCK NEEDS PRE OP CONSULT  WITH ANESTHESIA 120   SHOULDER SURGERY Left 2011   TONSILLECTOMY     age 56   TOTAL KNEE ARTHROPLASTY Left 02/07/2018   Procedure: TOTAL KNEE ARTHROPLASTY;  Surgeon: Ollen Gross, MD;  Location: [facility];  Service: Orthopedics;  Laterality: Left;    TUBAL LIGATION  1970   with an appy   TYMPANOSTOMY TUBE PLACEMENT  1984   VAGINAL HYSTERECTOMY  1970   Social History   Social History Narrative   Married 2 grown children, 1 adopted son-autistic-lives next door.   1  granddaughter   2 step grandsons   Immunization History  Administered Date(s) Administered   Influenza,inj,Quad PF,6+ Mos 12/15/2017, 11/15/2018   Influenza,inj,quad, With Preservative 11/16/2016   Influenza-Unspecified 11/22/2015, 11/01/2019, 11/30/2020, 11/26/2021, 11/17/2022   Moderna Sars-Covid-2 Vaccination 03/24/2019, 04/22/2019, 11/18/2019, 06/02/2020   Pneumococcal Conjugate-13 02/15/2014   Pneumococcal Polysaccharide-23 01/25/2020   Zoster Recombinant(Shingrix) 07/10/2016     Objective: Vital Signs: BP 117/74 (BP Location: Left Arm, Patient Position:  Sitting, Cuff Size: Normal)   Pulse 70   Resp 13   Ht 5\' 5"  (1.651 m)   Wt 185 lb 12.8 oz (84.3 kg)   BMI 30.92 kg/m    Physical Exam Vitals and nursing note reviewed.  Constitutional:      Appearance: She is well-developed.  HENT:     Head: Normocephalic and atraumatic.  Eyes:     Conjunctiva/sclera: Conjunctivae normal.  Cardiovascular:     Rate and Rhythm: Normal rate and regular rhythm.     Heart sounds: Normal heart sounds.  Pulmonary:     Effort: Pulmonary effort is normal.     Breath sounds: Normal breath sounds.  Abdominal:     General: Bowel sounds are normal.     Palpations: Abdomen is soft.  Musculoskeletal:     Cervical back: Normal range of motion.  Lymphadenopathy:     Cervical: No cervical adenopathy.  Skin:    General: Skin is warm and dry.     Capillary Refill: Capillary refill takes less than 2 seconds.  Neurological:     Mental Status: She is alert and oriented to person, place, and time.  Psychiatric:        Behavior: Behavior normal.      Musculoskeletal Exam: There is limited range of motion of the cervical spine.  Shoulder joint abduction and forward flexion was limited to about 70 degrees which was painful.  Left shoulder joint was in good range of motion.  Elbow joints and wrist joints in good range of motion.  She had tenderness over bilateral CMC joints.  Bilateral CMC PIP and DIP  thickening with no synovitis was noted.  Hip joints in good range of motion.  She had limited extension of her right knee joint.  Left knee joint was replaced.  No warmth swelling or effusion was noted.  There was no tenderness over ankles or MTPs.  CDAI Exam: CDAI Score: -- Patient Global: --; Provider Global: -- Swollen: --; Tender: -- Joint Exam 01/29/2023   No joint exam has been documented for this visit   There is currently no information documented on the homunculus. Go to the Rheumatology activity and complete the homunculus joint exam.  Investigation: No additional findings.  Imaging: No results found.  Recent Labs: Lab Results  Component Value Date   WBC 6.7 01/15/2022   HGB 13.6 01/15/2022   PLT 249 01/15/2022   NA 138 11/09/2022   K 4.3 11/09/2022   CL 104 11/09/2022   CO2 20 (L) 11/09/2022   GLUCOSE 96 11/09/2022   BUN 21 11/09/2022   CREATININE 0.79 11/09/2022   BILITOT 0.3 01/15/2022   ALKPHOS 67 01/15/2022   AST 16 10/30/2022   ALT 13 01/15/2022   PROT 6.7 01/15/2022   ALBUMIN 4.5 01/15/2022   CALCIUM 9.2 11/09/2022   GFRAA 84 07/07/2018    Speciality Comments: No specialty comments available.  Procedures:  No procedures performed Allergies: Blood-group specific substance, Ciprofloxacin, Codeine, Latex, Sertraline hcl, Vicodin [hydrocodone-acetaminophen], and Tramadol   Assessment / Plan:     Visit Diagnoses: Primary osteoarthritis of both hands-she had bilateral PIP and DIP thickening.  She had bilateral CMC tenderness.  Use of Voltaren gel and CMC brace was advised.  A prescription for right CMC brace was given.  Joint protection muscle strengthening was discussed.  Primary osteoarthritis of right shoulder - She had right rotator cuff tear surgery in March 2023.  She had limited range of motion.  Patient states she has  been advised right total shoulder replacement.  She could not schedule  shoulder surgery due to her husband's illness and plans to  do it in the near future.  Primary osteoarthritis of right knee-she continues to have discomfort in her right knee.  She had limited range of motion.  She states that she will need right total knee replacement.  Status post total knee replacement, left - She had total knee replacement in December 2019 by Dr. Despina Hick.  Doing well.  She continues to have some numbness over the lateral aspect of her lower extremity.  Primary osteoarthritis of both feet-chronic pain.  Proper fitting shoes were advised.  Pes cavus of both feet - orthotics were advised.  Hammertoes of both feet  Rash-patient had a dry scale behind her right pinna and also over the right middle finger.  The findings were suggestive of atopic dermatitis.  Use of over-the-counter hydrocortisone cream was advised.  Patient states she has seen a dermatologist in the past who gave her the diagnosis of psoriasis.  She does not recall if she had a skin biopsy.  Fibromyalgia-she has been having a flare of fibromyalgia with generalized pain and achiness.  She had positive tender points.  Other fatigue-related to follow myalgia.  Primary insomnia-good sleep hygiene was discussed.  Osteopenia of multiple sites -managed by her PCP.  Coarse tremors  Orders: No orders of the defined types were placed in this encounter.  No orders of the defined types were placed in this encounter.    Follow-Up Instructions: Return in about 6 months (around 07/30/2023) for Osteoarthritis.   Pollyann Savoy, MD  Note - This record has been created using Animal nutritionist.  Chart creation errors have been sought, but may not always  have been located. Such creation errors do not reflect on  the standard of medical care.

## 2023-01-18 NOTE — Transfer of Care (Signed)
Immediate Anesthesia Transfer of Care Note  Patient: Ruth [name]  Procedure(s) Performed: UPPER ENDOSCOPIC ULTRASOUND (EUS) RADIAL ESOPHAGOGASTRODUODENOSCOPY (EGD) WITH PROPOFOL BIOPSY  Patient Location: PACU  Anesthesia Type:MAC  Level of Consciousness: drowsy  Airway & Oxygen Therapy: Patient Spontanous Breathing and Patient connected to face mask oxygen  Post-op Assessment: Report given to RN and Post -op Vital signs reviewed and stable  Post vital signs: Reviewed and stable  Last Vitals:  Vitals Value Taken Time  BP    Temp    Pulse    Resp    SpO2      Last Pain:  Vitals:   01/18/23 0818  TempSrc: Temporal  PainSc: 0-No pain         Complications: No notable events documented.

## 2023-01-18 NOTE — Discharge Instructions (Signed)

## 2023-01-18 NOTE — Anesthesia Postprocedure Evaluation (Signed)
Anesthesia Post Note  Patient: Ruth [name]  Procedure(s) Performed: UPPER ENDOSCOPIC ULTRASOUND (EUS) RADIAL ESOPHAGOGASTRODUODENOSCOPY (EGD) WITH PROPOFOL BIOPSY     Patient location during evaluation: Endoscopy Anesthesia Type: MAC Level of consciousness: awake Pain management: pain level controlled Vital Signs Assessment: post-procedure vital signs reviewed and stable Respiratory status: spontaneous breathing, nonlabored ventilation and respiratory function stable Cardiovascular status: blood pressure returned to baseline and stable Postop Assessment: no apparent nausea or vomiting Anesthetic complications: no   No notable events documented.  Last Vitals:  Vitals:   01/18/23 0930 01/18/23 0940  BP: 131/60 135/65  Pulse: 78 71  Resp: (!) 21 16  Temp:    SpO2: 96% 97%    Last Pain:  Vitals:   01/18/23 0940  TempSrc:   PainSc: 7                  Fatimata Talsma P Lauryn Lizardi

## 2023-01-18 NOTE — Op Note (Signed)
[facility] Patient Name: Ruth [name] Procedure Date: 01/18/2023 MRN: [id number] Attending MD: Corliss Parish , MD, [id number] Date of Birth: January 04, 1941 CSN: [id number] Age: 82 Admit Type: Outpatient Procedure:                Upper EUS Indications:              Gastric deformity on endoscopy/Subepithelial tumor                            versus extrinsic compression, Dysphagia Providers:                Corliss Parish, MD, Lorenza Evangelist, RN,                            Kandice Robinsons, Technician, [facility] Technician,                            Technician Referring MD:             Katrinka Blazing Medicines:                Monitored Anesthesia Care Complications:            No immediate complications. Estimated Blood Loss:     Estimated blood loss was minimal. Procedure:                Pre-Anesthesia Assessment:                           - Prior to the procedure, a History and Physical                            was performed, and patient medications and                            allergies were reviewed. The patient's tolerance of                            previous anesthesia was also reviewed. The risks                            and benefits of the procedure and the sedation                            options and risks were discussed with the patient.                            All questions were answered, and informed consent                            was obtained. Prior Anticoagulants: The patient has                            taken no anticoagulant or antiplatelet agents. ASA                            Grade Assessment: III -  A patient with severe                            systemic disease. After reviewing the risks and                            benefits, the patient was deemed in satisfactory                            condition to undergo the procedure.                           After obtaining informed consent, the endoscope was                             passed under direct vision. Throughout the                            procedure, the patient's blood pressure, pulse, and                            oxygen saturations were monitored continuously. The                            [id number]-[id number] ([id number]) Olympus therapeutic endoscope                            was introduced through the mouth, and advanced to                            the second part of duodenum. The [id number]-[id number]                            ([id number]) Olympus radial ultrasound scope was                            introduced through the mouth, and advanced to the                            stomach for ultrasound examination. The upper EUS                            was accomplished without difficulty. The patient                            tolerated the procedure. Scope In: Scope Out: Findings:      ENDOSCOPIC FINDING: :      The examined esophagus was mildly tortuous.      No other gross mucosal lesions were noted in the entire esophagus.       Biopsies were taken with a cold forceps for histology to rule out       EOE/LOE.      The Z-line was irregular and was found 39 cm from the incisors.      A 1 cm hiatal hernia was present.  A single 8 mm subepithelial papule (nodule) with no bleeding and no       stigmata of recent bleeding was found on the anterior wall of the       stomach (approximately 48 to 49 cm from the incisors).      Patchy mildly erythematous mucosa without bleeding was found in the       entire examined stomach. This is previously been biopsied and negative       for H. pylori so not rebiopsied.      No gross lesions were noted in the duodenal bulb, in the first portion       of the duodenum and in the second portion of the duodenum.      ENDOSONOGRAPHIC FINDING: :      An intramural (subepithelial) lesion was found in the body of the       stomach. Located at approximately 48 to 49 cm from the incisors. The       lesion was hypoechoic.  Sonographically, the lesion appeared to originate       from the muscularis propria (Layer 4). The lesion measured 7 mm (in       maximum thickness). The lesion also measured 5 mm in diameter. The outer       endosonographic borders were well defined.      Endosonographic images of the stomach otherwise were unremarkable. No       masses and no wall thickening were identified.      Endosonographic imaging in the visualized portion of the liver showed no       mass.      No malignant-appearing lymph nodes were visualized in the celiac region       (level 20), peripancreatic region and porta hepatis region.      The celiac region was visualized. Impression:               EGD impression:                           - Tortuous esophagus. No other gross mucosal                            lesions in the entire esophagus. Biopsied.                           - Z-line irregular, 39 cm from the incisors.                           - 1 cm hiatal hernia.                           - A single subepithelial papule (nodule) found in                            the stomach (48 to 49 cm from the incisors).                           - Erythematous mucosa in the stomach. Previously                            biopsied.                           -  No gross lesions in the duodenal bulb, in the                            first portion of the duodenum and in the second                            portion of the duodenum.                           EUS impression:                           - An intramural (subepithelial) lesion was found in                            the body of the stomach (anterior). The lesion                            appeared to originate from within the muscularis                            propria (Layer 4). Tissue has not been obtained.                            However, the endosonographic appearance is                            consistent with a stromal cell (smooth muscle)                             lesion ?" GIST versus leiomyoma.                           - Endosonographic images of the stomach were                            otherwise unremarkable.                           - No malignant-appearing lymph nodes were                            visualized in the celiac region (level 20),                            peripancreatic region and porta hepatis region. Moderate Sedation:      Not Applicable - Patient had care per Anesthesia. Recommendation:           - The patient will be observed post-procedure,                            until all discharge criteria are met.                           - Discharge patient to home.                           -  Patient has a contact number available for                            emergencies. The signs and symptoms of potential                            delayed complications were discussed with the                            patient. Return to normal activities tomorrow.                            Written discharge instructions were provided to the                            patient.                           - Resume previous diet.                           - Observe patient's clinical course.                           - Await path results.                           - Repeat the upper endoscopic ultrasound in 1 year                            for surveillance of the lesion, and if stable,                            consider 2-year surveillance thereafter.                           - The findings and recommendations were discussed                            with the patient.                           - The findings and recommendations were discussed                            with the patient's family. Procedure Code(s):        --- Professional ---                           [phone], Esophagogastroduodenoscopy, flexible,                            transoral; with endoscopic ultrasound examination                            limited  to the esophagus, stomach or duodenum, and  adjacent structures                           43239, Esophagogastroduodenoscopy, flexible,                            transoral; with biopsy, single or multiple Diagnosis Code(s):        --- Professional ---                           Q39.9, Congenital malformation of esophagus,                            unspecified                           K22.89, Other specified disease of esophagus                           K44.9, Diaphragmatic hernia without obstruction or                            gangrene                           K31.89, Other diseases of stomach and duodenum                           I89.9, Noninfective disorder of lymphatic vessels                            and lymph nodes, unspecified                           R13.10, Dysphagia, unspecified CPT copyright 2022 American Medical Association. All rights reserved. The codes documented in this report are preliminary and upon coder review may  be revised to meet current compliance requirements. Corliss Parish, MD 01/18/2023 9:27:55 AM Number of Addenda: 0

## 2023-01-18 NOTE — Anesthesia Preprocedure Evaluation (Addendum)
Anesthesia Evaluation  Patient identified by MRN, date of birth, ID band Patient awake    Reviewed: Allergy & Precautions, NPO status , Patient's Chart, lab work & pertinent test results  History of Anesthesia Complications (+) history of anesthetic complications (laryngospasm)  Airway Mallampati: II       Dental no notable dental hx.    Pulmonary asthma , sleep apnea    Pulmonary exam normal        Cardiovascular negative cardio ROS Normal cardiovascular exam     Neuro/Psych  PSYCHIATRIC DISORDERS Anxiety Depression     Neuromuscular disease    GI/Hepatic Neg liver ROS, PUD,GERD  Medicated and Controlled,,  Endo/Other  negative endocrine ROS    Renal/GU negative Renal ROS     Musculoskeletal  (+) Arthritis ,  Fibromyalgia -  Abdominal  (+) + obese  Peds  Hematology  (+) REFUSES BLOOD PRODUCTS, JEHOVAH'S WITNESS  Anesthesia Other Findings gastric nodule  Reproductive/Obstetrics                             Anesthesia Physical Anesthesia Plan  ASA: 3  Anesthesia Plan: MAC   Post-op Pain Management:    Induction:   PONV Risk Score and Plan: 2 and Propofol infusion and Treatment may vary due to age or medical condition  Airway Management Planned: Nasal Cannula  Additional Equipment:   Intra-op Plan:   Post-operative Plan:   Informed Consent: I have reviewed the patients History and Physical, chart, labs and discussed the procedure including the risks, benefits and alternatives for the proposed anesthesia with the patient or authorized representative who has indicated his/her understanding and acceptance.     Dental advisory given  Plan Discussed with: CRNA  Anesthesia Plan Comments:        Anesthesia Quick Evaluation

## 2023-01-20 ENCOUNTER — Encounter (HOSPITAL_COMMUNITY): Payer: Self-pay | Admitting: Gastroenterology

## 2023-01-20 ENCOUNTER — Telehealth: Payer: Self-pay | Admitting: *Deleted

## 2023-01-20 LAB — SURGICAL PATHOLOGY

## 2023-01-20 NOTE — Telephone Encounter (Signed)
Copied from CRM [id number]. Topic: Clinical - Medication Refill >> Jan 20, 2023  1:57 PM Dondra Prader A wrote: Most Recent Primary Care Visit:  Provider: Lilyan Punt A  Department: [facility]  Visit Type: OFFICE VISIT  Date: 01/13/2023  Medication: oxyCODONE (ROXICODONE) 5 MG immediate release tablet, Pt requesting more than 5 tablets  Has the patient contacted their pharmacy? No (Agent: If no, request that the patient contact the pharmacy for the refill. If patient does not wish to contact the pharmacy document the reason why and proceed with request.) (Agent: If yes, when and what did the pharmacy advise?)  Is this the correct pharmacy for this prescription? Yes - [facility] [phone] If no, delete pharmacy and type the correct one.  This is the patient's preferred pharmacy:  [facility] [phone] - [place], [place] - [address] AT [facility] [address] [place] [address] Phone: [phone] Fax: [phone]  [facility] [address] [place] - [address] [place] [address] [place] [address] [place] [address] Phone: [phone] Fax: [phone]  [facility] - [place], [place] - [address] [place] [address] Phone: [phone] Fax: [phone]   Has the prescription been filled recently? No  Is the patient out of the medication? Yes  Has the patient been seen for an appointment in the last year OR does the patient have an upcoming appointment? Yes  Can we respond through MyChart? Yes  Agent: Please be advised that Rx refills may take up to 3 business days. We ask that you follow-up with your pharmacy.

## 2023-01-22 ENCOUNTER — Other Ambulatory Visit: Payer: Self-pay | Admitting: Family Medicine

## 2023-01-22 ENCOUNTER — Encounter (HOSPITAL_COMMUNITY): Payer: No Typology Code available for payment source

## 2023-01-22 ENCOUNTER — Encounter: Payer: Self-pay | Admitting: Gastroenterology

## 2023-01-22 MED ORDER — OXYCODONE HCL 5 MG PO TABS: ORAL_TABLET | ORAL | 0 refills | Status: DC

## 2023-01-22 NOTE — Telephone Encounter (Signed)
Short prescription for oxycodone was sent to [facility] caution drowsiness

## 2023-01-28 ENCOUNTER — Ambulatory Visit (HOSPITAL_COMMUNITY): Admit: 2023-01-28 | Payer: No Typology Code available for payment source | Admitting: Orthopedic Surgery

## 2023-01-28 SURGERY — REVERSE SHOULDER ARTHROPLASTY
Anesthesia: General | Site: Shoulder | Laterality: Right

## 2023-01-29 ENCOUNTER — Encounter: Payer: Self-pay | Admitting: Rheumatology

## 2023-01-29 ENCOUNTER — Ambulatory Visit: Payer: No Typology Code available for payment source | Attending: Rheumatology | Admitting: Rheumatology

## 2023-01-29 VITALS — BP 117/74 | HR 70 | Resp 13 | Ht 65.0 in | Wt 185.8 lb

## 2023-01-29 DIAGNOSIS — M19042 Primary osteoarthritis, left hand: Secondary | ICD-10-CM

## 2023-01-29 DIAGNOSIS — Z96652 Presence of left artificial knee joint: Secondary | ICD-10-CM

## 2023-01-29 DIAGNOSIS — M19011 Primary osteoarthritis, right shoulder: Secondary | ICD-10-CM

## 2023-01-29 DIAGNOSIS — M19041 Primary osteoarthritis, right hand: Secondary | ICD-10-CM

## 2023-01-29 DIAGNOSIS — Q6672 Congenital pes cavus, left foot: Secondary | ICD-10-CM

## 2023-01-29 DIAGNOSIS — G252 Other specified forms of tremor: Secondary | ICD-10-CM

## 2023-01-29 DIAGNOSIS — M797 Fibromyalgia: Secondary | ICD-10-CM

## 2023-01-29 DIAGNOSIS — F5101 Primary insomnia: Secondary | ICD-10-CM | POA: Diagnosis not present

## 2023-01-29 DIAGNOSIS — Q6671 Congenital pes cavus, right foot: Secondary | ICD-10-CM | POA: Diagnosis not present

## 2023-01-29 DIAGNOSIS — R5383 Other fatigue: Secondary | ICD-10-CM | POA: Diagnosis not present

## 2023-01-29 DIAGNOSIS — M19071 Primary osteoarthritis, right ankle and foot: Secondary | ICD-10-CM

## 2023-01-29 DIAGNOSIS — M8589 Other specified disorders of bone density and structure, multiple sites: Secondary | ICD-10-CM

## 2023-01-29 DIAGNOSIS — M1711 Unilateral primary osteoarthritis, right knee: Secondary | ICD-10-CM | POA: Diagnosis not present

## 2023-01-29 DIAGNOSIS — M2041 Other hammer toe(s) (acquired), right foot: Secondary | ICD-10-CM | POA: Diagnosis not present

## 2023-01-29 DIAGNOSIS — M2042 Other hammer toe(s) (acquired), left foot: Secondary | ICD-10-CM

## 2023-01-29 DIAGNOSIS — R21 Rash and other nonspecific skin eruption: Secondary | ICD-10-CM

## 2023-01-29 DIAGNOSIS — M19072 Primary osteoarthritis, left ankle and foot: Secondary | ICD-10-CM

## 2023-02-05 ENCOUNTER — Encounter (INDEPENDENT_AMBULATORY_CARE_PROVIDER_SITE_OTHER): Payer: Self-pay | Admitting: Gastroenterology

## 2023-02-19 ENCOUNTER — Other Ambulatory Visit: Payer: Self-pay | Admitting: Family Medicine

## 2023-02-23 NOTE — Telephone Encounter (Signed)
 Copied from CRM [id number]. Topic: Clinical - Medication Refill >> Feb 23, 2023  2:47 PM Farrel B wrote: Most Recent Primary Care Visit:  Provider: ALPHONSA HAMILTON A  Department: [facility]  Visit Type: OFFICE VISIT  Date: 01/13/2023  Medication: oxyCODONE  (ROXICODONE ) 5 MG immediate release tablet  Has the patient contacted their pharmacy? No (Agent: If no, request that the patient contact the pharmacy for the refill. If patient does not wish to contact the pharmacy document the reason why and proceed with request.) (Agent: If yes, when and what did the pharmacy advise?)  Is this the correct pharmacy for this prescription? Yes If no, delete pharmacy and type the correct one.  This is the patient's preferred pharmacy:  [facility] [phone] - [place], [place] - [address] AT [facility] [address] [place] [address] Phone: [phone] Fax: [phone]  [facility] [address] [place] - [address] [place] [address] [place] [address] [place] [address] Phone: [phone] Fax: [phone]   Has the prescription been filled recently? Yes  Is the patient out of the medication? Yes  Has the patient been seen for an appointment in the last year OR does the patient have an upcoming appointment? Yes  Can we respond through MyChart? Yes  Agent: Please be advised that Rx refills may take up to 3 business days. We ask that you follow-up with your pharmacy.

## 2023-02-26 ENCOUNTER — Encounter: Payer: Self-pay | Admitting: Family Medicine

## 2023-02-26 MED ORDER — OXYCODONE HCL 5 MG PO TABS: ORAL_TABLET | ORAL | 0 refills | Status: DC

## 2023-03-09 ENCOUNTER — Other Ambulatory Visit: Payer: Self-pay | Admitting: Nurse Practitioner

## 2023-03-09 ENCOUNTER — Encounter: Payer: Self-pay | Admitting: Family Medicine

## 2023-03-09 ENCOUNTER — Ambulatory Visit: Payer: Self-pay | Admitting: Family Medicine

## 2023-03-09 MED ORDER — NIRMATRELVIR/RITONAVIR (PAXLOVID)TABLET: 3.0000 | ORAL_TABLET | Freq: Two times a day (BID) | ORAL | 0 refills | Status: AC

## 2023-03-09 NOTE — Telephone Encounter (Signed)
Patient symptoms started all yesterday and had a lot of trouble last night with a cough and sob, not productive and very tight in chest. Having headaches, body aches, fever low grade , cough and soreness, please send [facility]lovid to [facility] [place]. Does not want an in person visit but is open to virtual if needed, please advie

## 2023-03-09 NOTE — Telephone Encounter (Signed)
Information obtained from the husband, Please use [facility], [employer], [place]  Chief Complaint: covid positive Symptoms: fatigue, HA, cough, chest congestion Frequency: since yesterday  Disposition: [] ED /[] Urgent Care (no appt availability in office) / [] Appointment(In office/virtual)/ []  [place] [facility]/ [x] Home Care/ [] Refused Recommended Disposition /[] [place] [facility]/ []  Follow-up with PCP  Additional Notes: Pt would [facility] [place]. Pt SPO2 is 95% and HR 69. Pt states that biggest concern is her chest congestion. Pt would like to know which OTC medications she can take for the s/s as she is on prozac. Advised to call EMS if pt becomes SOB. Pt temp 99.4. Did not get the vaccine this year. Does not want to sched appt.   Copied from CRM [id number]. Topic: Clinical - Pink Word Triage >> Mar 09, 2023  9:07 AM Louie Casa B wrote: Reason for Triage: patient tested positive for covid 19 Reason for Disposition  [1] COVID-19 diagnosed by positive lab test (e.g., PCR, rapid self-test kit) AND [2] mild symptoms (e.g., cough, fever, others) AND [3] no complications or SOB  Answer Assessment - Initial Assessment Questions 1. COVID-19 DIAGNOSIS: "How do you know that you have COVID?" (e.g., positive lab test or self-test, diagnosed by doctor or NP/PA, symptoms after exposure).     Self test at home 2. COVID-19 EXPOSURE: "Was there any known exposure to COVID before the symptoms began?" CDC Definition of close contact: within 6 feet (2 meters) for a total of 15 minutes or more over a 24-hour period.      Yes husband 3. ONSET: "When did the COVID-19 symptoms start?"      yesterday 4. WORST SYMPTOM: "What is your worst symptom?" (e.g., cough, fever, shortness of breath, muscle aches)     Chest congestion 5. COUGH: "Do you have a cough?" If Yes, ask: "How bad is the cough?"       Yes,  6. FEVER: "Do you have a fever?" If Yes, ask: "What is your  temperature, how was it measured, and when did it start?"     Yes 99.4 7. RESPIRATORY STATUS: "Describe your breathing?" (e.g., normal; shortness of breath, wheezing, unable to speak)      She's breathing okay, she's a little bit short winded 8. BETTER-SAME-WORSE: "Are you getting better, staying the same or getting worse compared to yesterday?"  If getting worse, ask, "In what way?"     worse 9. OTHER SYMPTOMS: "Do you have any other symptoms?"  (e.g., chills, fatigue, headache, loss of smell or taste, muscle pain, sore throat)     HA and fatigue 10. HIGH RISK DISEASE: "Do you have any chronic medical problems?" (e.g., asthma, heart or lung disease, weak immune system, obesity, etc.)       denies 11. VACCINE: "Have you had the COVID-19 vaccine?" If Yes, ask: "Which one, how many shots, when did you get it?"       No  13. O2 SATURATION MONITOR:  "Do you use an oxygen saturation monitor (pulse oximeter) at home?" If Yes, ask "What is your reading (oxygen level) today?" "What is your usual oxygen saturation reading?" (e.g., 95%)       95% with HR 69  Protocols used: Coronavirus (COVID-19) Diagnosed or Suspected-A-AH

## 2023-03-09 NOTE — Telephone Encounter (Signed)
Patient has been made aware per provider recommendations.  

## 2023-03-19 ENCOUNTER — Ambulatory Visit: Payer: Self-pay | Admitting: Family Medicine

## 2023-03-19 ENCOUNTER — Encounter: Payer: Self-pay | Admitting: Physician Assistant

## 2023-03-19 ENCOUNTER — Ambulatory Visit (INDEPENDENT_AMBULATORY_CARE_PROVIDER_SITE_OTHER): Payer: HMO | Admitting: Physician Assistant

## 2023-03-19 VITALS — BP 124/78 | HR 79 | Temp 97.3°F | Ht 65.0 in | Wt 187.0 lb

## 2023-03-19 DIAGNOSIS — U071 COVID-19: Secondary | ICD-10-CM | POA: Diagnosis not present

## 2023-03-19 MED ORDER — PROMETHAZINE-DM 6.25-15 MG/5ML PO SYRP: 5.0000 mL | ORAL_SOLUTION | Freq: Four times a day (QID) | ORAL | 0 refills | Status: DC | PRN

## 2023-03-19 MED ORDER — FLUTICASONE PROPIONATE 50 MCG/ACT NA SUSP: 2.0000 | Freq: Every day | NASAL | 6 refills | Status: DC

## 2023-03-19 NOTE — Telephone Encounter (Addendum)
Copied from CRM [id number]. Topic: Clinical - Red Word Triage >> Mar 19, 2023  3:04 PM Thomes Dinning wrote: Red Word that prompted transfer to Nurse Triage: Patient is still showing signs of COVID. Patient has a temp of 98.9 cough congestion and body/headaches  Chief Complaint: COVID + Symptoms: Cough, chest tightness, congestion, headache Frequency: 10 days Pertinent Negatives: Patient denies fever Disposition: [] ED /[] Urgent Care (no appt availability in office) / [x] Appointment(In office/virtual)/ []  [place] [facility]/ [] Home Care/ [] Refused Recommended Disposition /[] [name]/ []  Follow-up with PCP Additional Notes: Patient called in to report COVID symptoms. Patient originally tested positive for COVID on Monday, 1/20. Patient was prescribed Paxlovid. Patient has also been taking Mucinex and Tylenol for relief. Patient stated that her symptoms resolved for a few days, but now the symptoms have returned. Patient reported she is experiencing a dry cough, chest tightness due to cough, congestion, and headache. Patient stated that it is noticeably harder for her to take a deep breath. Patient checked her O2 saturation while on the phone with this RN and stated it was 96%. Advised patient to see a provider within 4 hours. No availability with PCP. Scheduled same day appointment with another provider in the office.   Reason for Disposition  MILD difficulty breathing (e.g., minimal/no SOB at rest, SOB with walking, pulse <100)  Answer Assessment - Initial Assessment Questions 1. COVID-19 DIAGNOSIS: "How do you know that you have COVID?" (e.g., positive lab test or self-test, diagnosed by doctor or NP/PA, symptoms after exposure).     Yes tested positive "again" with an at home test yesterday 3. ONSET: "When did the COVID-19 symptoms start?"      State symptoms started last Monday, 1/20 4. WORST SYMPTOM: "What is your worst symptom?" (e.g., cough, fever, shortness of breath, muscle  aches)     Coughing  5. COUGH: "Do you have a cough?" If Yes, ask: "How bad is the cough?"       Dry cough, states she is having coughing spells 6. FEVER: "Do you have a fever?" If Yes, ask: "What is your temperature, how was it measured, and when did it start?"     Denies fever, most recent temperature 97.8 7. RESPIRATORY STATUS: "Describe your breathing?" (e.g., normal; shortness of breath, wheezing, unable to speak)      States chest feels tight due to cough, states it's hard for her to take a deep breath in 8. BETTER-SAME-WORSE: "Are you getting better, staying the same or getting worse compared to yesterday?"  If getting worse, ask, "In what way?"     Felt better for 2-3 days and now bad again 9. OTHER SYMPTOMS: "Do you have any other symptoms?"  (e.g., chills, fatigue, headache, loss of smell or taste, muscle pain, sore throat)     Congestion, slight headache, sneezing 10. HIGH RISK DISEASE: "Do you have any chronic medical problems?" (e.g., asthma, heart or lung disease, weak immune system, obesity, etc.)       Asthma, prediabetes 13. O2 SATURATION MONITOR:  "Do you use an oxygen saturation monitor (pulse oximeter) at home?" If Yes, ask "What is your reading (oxygen level) today?" "What is your usual oxygen saturation reading?" (e.g., 95%)       96% while on the phone with this RN  Protocols used: Coronavirus (COVID-19) Diagnosed or Suspected-A-AH

## 2023-03-19 NOTE — Progress Notes (Signed)
Acute Office Visit  Subjective:     [name], female    DOB: 02/19/1940, 83 y.o.   MRN: [id number]   HPI Patient is in today for concerns of fatigue and congestion. Patient states early last week she had a positive home Covid test and treated with Paxlovid. She reports improvement in symptoms following treatment, however yesterday began experiencing similar symptoms to onset of Covid infection. Today patient is complaining of fatigue, congestion, chest heaviness, and cough. Patient denies chest pain, shortness of breath of fevers. Patient has been taking Mucinex, tylenol, and Dayquil at home.   Review of Systems  Constitutional:  Positive for diaphoresis and malaise/fatigue. Negative for chills and fever.  HENT:  Positive for congestion and ear pain. Negative for sore throat.   Respiratory:  Positive for cough. Negative for shortness of breath and wheezing.   Cardiovascular:  Negative for chest pain and palpitations.  Musculoskeletal:  Negative for myalgias.        Objective:     BP 124/78   Pulse 79   Temp (!) 97.3 F (36.3 C)   Ht 5\' 5"  (1.651 m)   Wt 187 lb (84.8 kg)   SpO2 96%   BMI 31.12 kg/m   Physical Exam Vitals reviewed.  Constitutional:      General: She is not in acute distress.    Appearance: Normal appearance.  HENT:     Right Ear: Tympanic membrane normal.     Left Ear: Tympanic membrane normal.     Nose: Congestion present.     Mouth/Throat:     Mouth: Mucous membranes are moist.     Pharynx: Oropharynx is clear.  Eyes:     Extraocular Movements: Extraocular movements intact.     Conjunctiva/sclera: Conjunctivae normal.  Cardiovascular:     Rate and Rhythm: Normal rate and regular rhythm.     Heart sounds: No murmur heard.    No friction rub. No gallop.  Pulmonary:     Effort: Pulmonary effort is normal.     Breath sounds: Normal breath sounds. No stridor. No wheezing, rhonchi or rales.  Musculoskeletal:        General:  Normal range of motion.  Skin:    General: Skin is warm and dry.     Capillary Refill: Capillary refill takes less than 2 seconds.  Neurological:     General: No focal deficit present.     Mental Status: She is alert and oriented to person, place, and time.  Psychiatric:        Mood and Affect: Mood normal.        Behavior: Behavior normal.     No results found for any visits on 03/19/23.      Assessment & Plan:  Reinfection by COVID-19 virus -     Fluticasone Propionate; Place 2 sprays into both nostrils daily.  Dispense: 16 g; Refill: 6 -     Promethazine-DM; Take 5 mLs by mouth 4 (four) times daily as needed for cough.  Dispense: 118 mL; Refill: 0   Patient appears stable today. Overall benign exam, lungs clear to auscultation bilaterally.  Likely self-resolving viral infection. Supportive care reviewed with patient. Discussed with patient that there are no indications for antibiotics at this time, and viral respiratory illness can be persistent in duration. Promethazine DM for cough. Tylenol or ibuprofen for pain or fever as needed. I advised Flonase for nasal congestion and ear pressure. May continue with OTC cold medications. Patient  instructed to seek care at urgent care or emergency if worsening shortness of breath, chest pain, or hypoxia over the weekend. Patient to return to clinic if not improving in the next week or so. Patient agreeable to plan.    Return if symptoms worsen or fail to improve.  Toni Amend Ceilidh Torregrossa, PA-C

## 2023-03-22 ENCOUNTER — Ambulatory Visit (INDEPENDENT_AMBULATORY_CARE_PROVIDER_SITE_OTHER): Payer: No Typology Code available for payment source | Admitting: Gastroenterology

## 2023-03-30 ENCOUNTER — Other Ambulatory Visit: Payer: Self-pay | Admitting: Family Medicine

## 2023-03-30 NOTE — Telephone Encounter (Unsigned)
Copied from CRM [id number]. Topic: Clinical - Medication Refill >> Mar 30, 2023  3:21 PM Geroge Baseman wrote: Most Recent Primary Care Visit:  Provider: Olga Millers  Department: [facility]  Visit Type: ACUTE  Date: 03/19/2023  Medication: oxyCODONE (ROXICODONE) 5 MG immediate release tablet  Has the patient contacted their pharmacy? Yes Pharmacy said to call office.  Is this the correct pharmacy for this prescription? Yes If no, delete pharmacy and type the correct one.  This is the patient's preferred pharmacy:   [facility] [address], [place] - [address] [place] [address] [place] [address] Phone: [phone] Fax: [phone]   Has the prescription been filled recently? Yes  Is the patient out of the medication? Yes  Has the patient been seen for an appointment in the last year OR does the patient have an upcoming appointment? No  Can we respond through MyChart? Yes, prefers phone   Agent: Please be advised that Rx refills may take up to 3 business days. We ask that you follow-up with your pharmacy.

## 2023-03-30 NOTE — Telephone Encounter (Signed)
Copied from CRM [id number]. Topic: Clinical - Medication Refill >> Mar 30, 2023  3:21 PM Geroge Baseman wrote: Most Recent Primary Care Visit:  Provider: Olga Millers  Department: [facility]  Visit Type: ACUTE  Date: 03/19/2023  Medication: oxyCODONE (ROXICODONE) 5 MG immediate release tablet  Has the patient contacted their pharmacy? Yes Pharmacy said to call office.  Is this the correct pharmacy for this prescription? Yes If no, delete pharmacy and type the correct one.  This is the patient's preferred pharmacy:   [facility] [address], [place] - [address] [place] [address] [place] [address] Phone: [phone] Fax: [phone]   Has the prescription been filled recently? Yes  Is the patient out of the medication? Yes  Has the patient been seen for an appointment in the last year OR does the patient have an upcoming appointment? No  Can we respond through MyChart? Yes, prefers phone   Agent: Please be advised that Rx refills may take up to 3 business days. We ask that you follow-up with your pharmacy.

## 2023-03-31 MED ORDER — OXYCODONE HCL 5 MG PO TABS: ORAL_TABLET | ORAL | 0 refills | Status: DC

## 2023-04-01 ENCOUNTER — Ambulatory Visit (INDEPENDENT_AMBULATORY_CARE_PROVIDER_SITE_OTHER): Payer: HMO | Admitting: Gastroenterology

## 2023-04-01 ENCOUNTER — Encounter (INDEPENDENT_AMBULATORY_CARE_PROVIDER_SITE_OTHER): Payer: Self-pay | Admitting: Gastroenterology

## 2023-04-01 ENCOUNTER — Telehealth: Payer: Self-pay

## 2023-04-01 VITALS — BP 121/73 | HR 70 | Temp 97.9°F | Ht 65.0 in | Wt 188.7 lb

## 2023-04-01 DIAGNOSIS — R1013 Epigastric pain: Secondary | ICD-10-CM | POA: Diagnosis not present

## 2023-04-01 DIAGNOSIS — K219 Gastro-esophageal reflux disease without esophagitis: Secondary | ICD-10-CM

## 2023-04-01 MED ORDER — FAMOTIDINE 40 MG PO TABS: 40.0000 mg | ORAL_TABLET | Freq: Every day | ORAL | 1 refills | Status: AC

## 2023-04-01 NOTE — Telephone Encounter (Signed)
Communication  Reason for CRM: This patient is requesting oxyCODONE (ROXICODONE) 5 MG to be sent over to [facility] [address] [place], [place] [address]. She states the last fill went to [facility] and she visited their pharmacy and was advised they are currently out of stock on this medication.

## 2023-04-01 NOTE — Patient Instructions (Signed)
Please take famotidine 40 mg daily, 30 minutes prior to breakfast in the mornings Continue to be mindful of greasy, spicy, citrus/tomato-based foods, chocolate, caffeine as this can worsen reflux symptoms Make sure to avoid eating late and stay upright 2 to 3 hours after eating prior to laying down Continue to sleep with head of bed elevated Please give me an update about 2 to 3 weeks on how you are reflux symptoms are doing on regular dosing of famotidine  Follow-up 6 months

## 2023-04-01 NOTE — Progress Notes (Addendum)
Referring Provider: Babs Sciara, MD Primary Care Physician:  Babs Sciara, MD Primary GI Physician: Dr. Levon Hedger   Chief Complaint  Patient presents with   Abdominal Pain    Follow up on epigastric pain. Stopped omeprazole in January and thinks pain has gotten better since then.    HPI:   Ruth [name] is a 83 y.o. female with past medical history of   anemia, anxiety, arthritis, depression, fatty liver, fibromyalgia, GERD, H pylori ulcer, HLD, IBS, OSA   Patient presenting today for follow up of GERD and Epigastric pain   Last seen October 2024, at that time patient presenting with ongoing epigastric pain, worse at night.  Cannot pinpoint any specific factors that seem to trigger pain.  Abdominal pain did not improve with start omeprazole 40 mg daily after EGD.  Endorse some weight loss.  Having some constipation which takes stool softeners for occasionally sometimes taking Dulcolax as well.  Patient recommended continue omeprazole 40 mg daily, CT A/P with contrast, keep scheduled EUS in December  -CT A/P with contrast on 12/04/2022 with no acute abdominopelvic process  EUS done 01/18/2023 : EGD: tortuous esophagus, Z-line irregular, 39 cm from the incisors, 1 cm hiatal hernia, single subepithelial papule found in the stomach, erythematous mucosa in the stomach, no gross lesions in duodenal bulb, first portion of duodenum and second portion of duodenum. [id number] subepithelial lesion in the body of the stomach lesion appeared to originate from within the muscularis propria.  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  Biopsies from EUS last EGD with squamous mucosa with no significant pathology  Present: Patient states she continues to have some  epigastric pain, mostly at night when this occurs. She does report she has this almost every night. She sleeps with HOB elevated.  She states she can now tolerate some things like salsa more than previously, cannot tolerate alcohol at all as this makes her epigastric pain worse.  She stopped her omeprazole when she had covid. She thinks her epigastric pain may actually be improve but feels regurgitation has worsened. She is taking pepcid now PRN for heartburn/regurgitation but not quite sure how often she is taking this. She notes that she often does not take anything for the regurgitation/heartburn as it usually resolves after she goes and rinses her mouth out. No nausea or vomiting. Does not think she has had any rectal bleeding or melena but often does not look at there stools. No changes in appetite or weight loss.    Last Last Colonoscopy:08/2020  - One small polyp in the ascending colon. (TA)                           - External hemorrhoids.                           - Anal papilla(e) were hypertrophied. Last Endoscopy:11/11/22 - Normal esophagus.                           - A single submucosal papule (nodule) found in the  stomach.                           - Erosive gastropathy with no stigmata of recent                            bleeding. Biopsied.                           - Normal examined duodenum. Biopsied. A. SMALL BOWEL, BIOPSY: Benign duodenal mucosa with no diagnostic abnormality   B. STOMACH, BIOPSY: Mild reactive gastropathy Negative for H. pylori, intestinal metaplasia, dysplasia and carcinoma   Recommendations:     Past Medical History:  Diagnosis Date   Anemia    Anxiety    Arthritis    Asthma    Complication of anesthesia    woke up during surgery and hard to wake up, laryngospasms   Depression    Eczema    Fatty liver    Fibromyalgia    GERD (gastroesophageal reflux disease)    EGD/ colon 1/09   H pylori ulcer 1980-1990   s/p  treatment   History of kidney stones    Hyperlipidemia    IBS (irritable bowel syndrome)    Kidney cysts    Laryngospasm    Neck pain    OSA (obstructive sleep apnea)    pt havin gsleep study  late part of 09/2022   Pneumonia 2013   PONV (postoperative nausea and vomiting)    Pre-diabetes    Psoriasis 2023   dx by [facility] per patient   Refusal of blood transfusions as patient is Jehovah's Witness     Past Surgical History:  Procedure Laterality Date   BILATERAL SALPINGOOPHORECTOMY  1990s   abdominal   BIOPSY  09/11/2020   Procedure: BIOPSY;  Surgeon: Malissa Hippo, MD;  [facility];  Service: Endoscopy;;  gastric   BIOPSY  11/11/2022   Procedure: BIOPSY;  Surgeon: Dolores Frame, MD;  [facility];  Service: Gastroenterology;;   BIOPSY  01/18/2023   Procedure: BIOPSY;  Surgeon: Lemar Lofty., MD;  Location: [facility];  Service: Gastroenterology;;   CARDIAC CATHETERIZATION  2002   [facility]) normal coronary arteries    COLONOSCOPY  01/2007   Dr. Laurie Panda   COLONOSCOPY N/A 04/18/2015   Procedure: COLONOSCOPY;  Surgeon: Malissa Hippo, MD;  [facility];  Service: Endoscopy;  Laterality: N/A;  830 - moved to 8:55 - Ann to notify pt   COLONOSCOPY WITH ESOPHAGOGASTRODUODENOSCOPY (EGD)  02/16/2012   Procedure: COLONOSCOPY WITH ESOPHAGOGASTRODUODENOSCOPY (EGD);  Surgeon: Corbin Ade, MD;  [facility];  Service: Endoscopy;  Laterality: N/A;  8:45   COLONOSCOPY WITH PROPOFOL N/A 09/11/2020   Procedure: COLONOSCOPY WITH PROPOFOL;  Surgeon: Malissa Hippo, MD;  [facility];  Service: Endoscopy;  Laterality: N/A;  10:55   ESOPHAGEAL DILATION N/A 06/07/2014   Procedure: ESOPHAGEAL DILATION;  Surgeon: Malissa Hippo, MD;  [facility];  Service: Endoscopy;  Laterality: N/A;   ESOPHAGEAL DILATION N/A 09/11/2020   Procedure: ESOPHAGEAL DILATION;  Surgeon: Malissa Hippo, MD;   [facility];  Service: Endoscopy;  Laterality: N/A;   ESOPHAGOGASTRODUODENOSCOPY  01/2007   Dr Jarold Motto- 3 cm hiatal hernia, benign esophageal biopsies, erosive esophagitis, gastritis   ESOPHAGOGASTRODUODENOSCOPY N/A 06/07/2014   Procedure: ESOPHAGOGASTRODUODENOSCOPY (EGD);  Surgeon: Malissa Hippo, MD;  [facility];  Service: Endoscopy;  Laterality: N/A;  1200   ESOPHAGOGASTRODUODENOSCOPY N/A 08/18/2017   Procedure: ESOPHAGOGASTRODUODENOSCOPY (EGD);  Surgeon: Malissa Hippo, MD;  [facility];  Service: Endoscopy;  Laterality: N/A;  2:20   ESOPHAGOGASTRODUODENOSCOPY (EGD) WITH PROPOFOL N/A 09/11/2020   Procedure: ESOPHAGOGASTRODUODENOSCOPY (EGD) WITH PROPOFOL;  Surgeon: Malissa Hippo, MD;  [facility];  Service: Endoscopy;  Laterality: N/A;   ESOPHAGOGASTRODUODENOSCOPY (EGD) WITH PROPOFOL N/A 11/11/2022   Procedure: ESOPHAGOGASTRODUODENOSCOPY (EGD) WITH PROPOFOL;  Surgeon: Dolores Frame, MD;  [facility];  Service: Gastroenterology;  Laterality: N/A;  7:30am;asa 3   ESOPHAGOGASTRODUODENOSCOPY (EGD) WITH PROPOFOL N/A 01/18/2023   Procedure: ESOPHAGOGASTRODUODENOSCOPY (EGD) WITH PROPOFOL;  Surgeon: Meridee Score Netty Starring., MD;  Location: [facility];  Service: Gastroenterology;  Laterality: N/A;   EUS N/A 01/18/2023   Procedure: UPPER ENDOSCOPIC ULTRASOUND (EUS) RADIAL;  Surgeon: Lemar Lofty., MD;  Location: [facility];  Service: Gastroenterology;  Laterality: N/A;   FOOT SURGERY Left 2005   hammer toe   INNER EAR SURGERY     Left   KNEE SURGERY Left    x 2   POLYPECTOMY  09/11/2020   Procedure: POLYPECTOMY;  Surgeon: Malissa Hippo, MD;  [facility];  Service: Endoscopy;;   SHOULDER ARTHROSCOPY WITH ROTATOR CUFF REPAIR Right 05/07/2021   Procedure: SHOULDER ARTHROSCOPY WITH MANIPULATION UNDER ANESTHESIA ,SUBACROMIAL DECOMPRESSION, DISTAL CLAVICLE RESECTION, ROTATOR CUFF REPAIR , BICEPS  TENOTOMY;  Surgeon: Eugenia Mcalpine, MD;  Location: [facility];  Service: Orthopedics;  Laterality: Right;  WITH INTERSCALENE BLOCK NEEDS PRE OP CONSULT  WITH ANESTHESIA 120   SHOULDER SURGERY Left 2011   TONSILLECTOMY     age 72   TOTAL KNEE ARTHROPLASTY Left 02/07/2018   Procedure: TOTAL KNEE ARTHROPLASTY;  Surgeon: Ollen Gross, MD;  Location: [facility];  Service: Orthopedics;  Laterality: Left;    TUBAL LIGATION  1970   with an appy   TYMPANOSTOMY TUBE PLACEMENT  1984   VAGINAL HYSTERECTOMY  1970    Current Outpatient Medications  Medication Sig Dispense Refill   acetaminophen (TYLENOL) 500 MG tablet Take 1,000 mg by mouth every 6 (six) hours as needed for moderate pain.     acidophilus (RISAQUAD) CAPS capsule Take 1 capsule by mouth 4 (four) times a week.     albuterol (VENTOLIN HFA) 108 (90 Base) MCG/ACT inhaler Inhale 2 puffs into the lungs every 6 (six) hours as needed for wheezing. 1 each 2   Ascorbic Acid (VITAMIN C PO) Take 1 tablet by mouth 4 (four) times a week.     b complex vitamins capsule Take 1 capsule by mouth 4 (four) times a week.     dicyclomine (BENTYL) 10 MG capsule Take 1 capsule (10 mg total) by mouth 3 (three) times daily as needed. For stomach cramps 90 capsule 5   FLUoxetine (PROZAC) 10 MG capsule Take 2 capsules by mouth twice daily 120 capsule 3   FLUoxetine (PROZAC) 20 MG capsule TAKE 1 CAPSULES BY MOUTH TWICE DAILY 40 mg total 180 capsule 1   fluticasone (CUTIVATE) 0.05 % cream Apply 1 application  topically daily as needed (irritation).     fluticasone (FLONASE) 50 MCG/ACT nasal spray Place 2 sprays into both nostrils daily. 16 g 6   ketoconazole (NIZORAL) 2 % cream Apply 1 application topically daily. (Patient taking differently: Apply 1 application  topically daily as needed for irritation.) 60 g 5  lidocaine-prilocaine (EMLA) cream Apply 1 Application topically as needed. 30 g 2   nitroGLYCERIN (NITROSTAT) 0.4 MG SL tablet as needed.     ondansetron  (ZOFRAN) 4 MG tablet Take 1 tablet (4 mg total) by mouth every 8 (eight) hours as needed for nausea or vomiting. 20 tablet 0   oxyCODONE (ROXICODONE) 5 MG immediate release tablet 1/2 to 1 q 6 hours prn pain 8 tablet 0   Polyvinyl Alcohol (LUBRICANT DROPS OP) Place 2 drops into both eyes 3 (three) times daily as needed (for dry eyes).     famotidine (PEPCID) 40 MG tablet Take 1 tablet (40 mg total) by mouth daily. 60 tablet 1   promethazine-dextromethorphan (PROMETHAZINE-DM) 6.25-15 MG/5ML syrup Take 5 mLs by mouth 4 (four) times daily as needed for cough. (Patient not taking: Reported on 04/01/2023) 118 mL 0   No current facility-administered medications for this visit.    Allergies as of 04/01/2023 - Review Complete 04/01/2023  Allergen Reaction Noted   Blood-group specific substance Other (See Comments) 02/16/2012   Ciprofloxacin Other (See Comments) 02/11/2009   Codeine Nausea And Vomiting 02/11/2009   Latex Other (See Comments) 08/16/2017   Sertraline hcl Other (See Comments) 05/10/2020   Vicodin [hydrocodone-acetaminophen] Nausea And Vomiting 09/23/2015   Tramadol Swelling, Palpitations, and Hypertension 01/23/2015    Family History  Problem Relation Age of Onset   Paranoid behavior Mother        depression   Colon polyps Mother    Eczema Mother    Depression Mother    Diabetes Father    Heart attack Father        23's   Lung cancer Father    Cirrhosis Father 48       etoh cirrhosis   Eczema Father    Lung cancer Sister    Depression Sister    Alzheimer's disease Sister    Hypertension Sister    Diabetes Sister    Lung cancer Sister    Sleep apnea Sister    Depression Maternal Aunt    COPD Daughter    Drug abuse Niece    Suicidality Niece    Allergic rhinitis Neg Hx    Asthma Neg Hx    Urticaria Neg Hx     Social History   Socioeconomic History   Marital status: Married    Spouse name: Molly Maduro   Number of children: 2   Years of education: Not on file    Highest education level: Some college, no degree  Occupational History   Occupation: Retired; Engineer, agricultural  Tobacco Use   Smoking status: Never    Passive exposure: Past   Smokeless tobacco: Never  Vaping Use   Vaping status: Never Used  Substance and Sexual Activity   Alcohol use: Yes    Alcohol/week: 0.0 standard drinks of alcohol    Comment: per patient very seldom   Drug use: No   Sexual activity: Not Currently    Partners: Male    Comment: Hysterectomy  Other Topics Concern   Not on file  Social History Narrative   Married 2 grown children, 1 adopted son-autistic-lives next door.   1 granddaughter   2 step grandsons   Social Drivers of Corporate investment banker Strain: Patient Declined (05/14/2022)   Overall Financial Resource Strain (CARDIA)    Difficulty of Paying Living Expenses: Patient declined  Food Insecurity: Patient Declined (05/14/2022)   Hunger Vital Sign    Worried About Running Out of Food in the  Last Year: Patient declined    Ran Out of Food in the Last Year: Patient declined  Transportation Needs: Patient Declined (05/14/2022)   PRAPARE - Administrator, Civil Service (Medical): Patient declined    Lack of Transportation (Non-Medical): Patient declined  Physical Activity: Inactive (05/14/2022)   Exercise Vital Sign    Days of Exercise per Week: 0 days    Minutes of Exercise per Session: 30 min  Stress: Stress Concern Present (05/14/2022)   [employer] of Occupational Health - Occupational Stress Questionnaire    Feeling of Stress : Rather much  Social Connections: Socially Integrated (05/14/2022)   Social Connection and Isolation Panel [NHANES]    Frequency of Communication with Friends and Family: More than three times a week    Frequency of Social Gatherings with Friends and Family: More than three times a week    Attends Religious Services: More than 4 times per year    Active Member of [employer] or Organizations: Yes    Attends Occupational hygienist Meetings: 1 to 4 times per year    Marital Status: Married    Review of systems General: negative for malaise, night sweats, fever, chills, weight loss Neck: Negative for lumps, goiter, pain and significant neck swelling Resp: Negative for cough, wheezing, dyspnea at rest CV: Negative for chest pain, leg swelling, palpitations, orthopnea GI: denies melena, hematochezia, nausea, vomiting, diarrhea, constipation, dysphagia, odyonophagia, early satiety or unintentional weight loss. +epigastric pain +acid regurgitation  The remainder of the review of systems is noncontributory.  Physical Exam: BP 121/73   Pulse 70   Temp 97.9 F (36.6 C) (Oral)   Ht 5\' 5"  (1.651 m)   Wt 188 lb 11.2 oz (85.6 kg)   BMI 31.40 kg/m  General:   Alert and oriented. No distress noted. Pleasant and cooperative.  Head:  Normocephalic and atraumatic. Eyes:  Conjuctiva clear without scleral icterus. Mouth:  Oral mucosa pink and moist. Good dentition. No lesions. Heart: Normal rate and rhythm, s1 and s2 heart sounds present.  Lungs: Clear lung sounds in all lobes. Respirations equal and unlabored. Abdomen:  +BS, soft, and non-distended. TTP of epigastric region. No rebound or guarding. No HSM or masses noted. Neurologic:  Alert and  oriented x4 Psych:  Alert and cooperative. Normal mood and affect.  Invalid input(s): "6 MONTHS"   ASSESSMENT: Ruth [name] is a 83 y.o. female presenting today for follow up of GERD and Epigastric pain  GERD/Epigastric pain: Epigastric pain that has been chronic over many years they became worse in mid 2024.  She has had workup to include EUS, EGD, CT imaging without any findings to explain her epigastric pain.  She did feel that her GERD may have been better controlled on omeprazole though she had no improvement in her epigastric pain on PPI therapy.  Notes that pain is worse at night, eating does not change her pain, she has had no weight loss and has no early  satiety, nausea, vomiting.  Etiology of her epigastric pain is unclear at this time, could be a component of functional pain.  At this time she does not wish to undergo any further investigations for her pain and reassuringly has no associated alarm symptoms.  As she is having more reflux symptoms and is hesitant to go back on PPI therapy given family history of Alzheimer's disease, will try scheduled dosing of famotidine 40 mg daily to see how she does with this.  I encouraged her to  be mindful of good reflux precautions as well and to give me an update about 2 3 weeks on how her GERD symptoms are being managed with H2 blocker.   PLAN:  -start famotidine 40mg  daily, 30 minutes prior to breakfast -good reflux precautions -pt to call with an update in 2-3 weeks on how GERD is on scheduled famotidine dosing -good reflux precautions -Repeat EUS with Dr. Meridee Score in December 2025  All questions were answered, patient verbalized understanding and is in agreement with plan as outlined above.    Follow Up: 6 months   Nasirah Sachs L. Jeanmarie Hubert, MSN, APRN, AGNP-C Adult-Gerontology Nurse Practitioner [facility]  I have reviewed the note and agree with the APP's assessment as described in this progress note  Katrinka Blazing, MD Gastroenterology and Hepatology [facility]

## 2023-04-02 ENCOUNTER — Other Ambulatory Visit: Payer: Self-pay | Admitting: Family Medicine

## 2023-04-02 MED ORDER — OXYCODONE HCL 5 MG PO TABS: ORAL_TABLET | ORAL | 0 refills | Status: DC

## 2023-04-02 NOTE — Telephone Encounter (Signed)
Prescription was sent to [facility] in [place] as requested

## 2023-04-09 ENCOUNTER — Ambulatory Visit (INDEPENDENT_AMBULATORY_CARE_PROVIDER_SITE_OTHER): Payer: HMO

## 2023-04-09 VITALS — Ht 65.0 in | Wt 183.0 lb

## 2023-04-09 DIAGNOSIS — Z0000 Encounter for general adult medical examination without abnormal findings: Secondary | ICD-10-CM

## 2023-04-09 DIAGNOSIS — Z1231 Encounter for screening mammogram for malignant neoplasm of breast: Secondary | ICD-10-CM

## 2023-04-09 NOTE — Patient Instructions (Signed)
Ruth [name] , Thank you for taking time to come for your Medicare Wellness Visit. I appreciate your ongoing commitment to your health goals. Please review the following plan we discussed and let me know if I can assist you in the future.   Referrals/Orders/Follow-Ups/Clinician Recommendations:   Next Medicare Annual Wellness Visit:  April 14, 2024 at 10:40 am virtual visit  An order has been placed for you to have your yearly mammogram. Please call the number below to schedule your appointment  [facility] at [facility] [address] [place], [place] [address] [phone]  Schedule your  screening mammogram through MyChart!   Log into your MyChart account.  Go to 'Visit' (or 'Appointments' if on mobile App) --> Schedule an Appointment  Under 'Select a Reason for Visit' choose the Mammogram Screening option.  Complete the pre-visit questions and select the time and place that best fits your schedule.  Health Maintenance  Topic Date Due   DTaP/Tdap/Td vaccine (1 - Tdap) Never done   Zoster (Shingles) Vaccine (2 of 2) 09/04/2016   COVID-19 Vaccine (5 - 2024-25 season) 10/18/2022   Mammogram  05/22/2023   Medicare Annual Wellness Visit  04/08/2024   DEXA scan (bone density measurement)  05/21/2024   Pneumonia Vaccine  Completed   Flu Shot  Completed   HPV Vaccine  Aged Out   Colon Cancer Screening  Discontinued    Advanced directives: (Declined) Advance directive discussed with you today. Even though you declined this today, please call our office should you change your mind, and we can give you the proper paperwork for you to fill out.  Next Medicare Annual Wellness Visit scheduled for next year: yes  Understanding Your Risk for Falls Millions of people have serious injuries from falls each year. It is important to understand your risk of falling. Talk with your health care provider about your risk and what you can do to lower it. If you  do have a serious fall, make sure to tell your provider. Falling once raises your risk of falling again. How can falls affect me? Serious injuries from falls are common. These include: Broken bones, such as hip fractures. Head injuries, such as traumatic brain injuries (TBI) or concussions. A fear of falling can cause you to avoid activities and stay at home. This can make your muscles weaker and raise your risk for a fall. What can increase my risk? There are a number of risk factors that increase your risk for falling. The more risk factors you have, the higher your risk of falling. Serious injuries from a fall happen most often to people who are older than 82 years old. Teenagers and young adults ages 23-29 are also at higher risk. Common risk factors include: Weakness in the lower body. Being generally weak or confused due to long-term (chronic) illness. Dizziness or balance problems. Poor vision. Medicines that cause dizziness or drowsiness. These may include: Medicines for your blood pressure, heart, anxiety, insomnia, or swelling (edema). Pain medicines. Muscle relaxants. Other risk factors include: Drinking alcohol. Having had a fall in the past. Having foot pain or wearing improper footwear. Working at a dangerous job. Having any of the following in your home: Tripping hazards, such as floor clutter or loose rugs. Poor lighting. Pets. Having dementia or memory loss. What actions can I take to lower my risk of falling?     Physical activity Stay physically fit. Do strength and balance exercises. Consider taking a regular class  to build strength and balance. Yoga and tai chi are good options. Vision Have your eyes checked every year and your prescription for glasses or contacts updated as needed. Shoes and walking aids Wear non-skid shoes. Wear shoes that have rubber soles and low heels. Do not wear high heels. Do not walk around the house in socks or slippers. Use a  cane or walker as told by your provider. Home safety Attach secure railings on both sides of your stairs. Install grab bars for your bathtub, shower, and toilet. Use a non-skid mat in your bathtub or shower. Attach bath mats securely with double-sided, non-slip rug tape. Use good lighting in all rooms. Keep a flashlight near your bed. Make sure there is a clear path from your bed to the bathroom. Use night-lights. Do not use throw rugs. Make sure all carpeting is taped or tacked down securely. Remove all clutter from walkways and stairways, including extension cords. Repair uneven or broken steps and floors. Avoid walking on icy or slippery surfaces. Walk on the grass instead of on icy or slick sidewalks. Use ice melter to get rid of ice on walkways in the winter. Use a cordless phone. Questions to ask your health care provider Can you help me check my risk for a fall? Do any of my medicines make me more likely to fall? Should I take a vitamin D supplement? What exercises can I do to improve my strength and balance? Should I make an appointment to have my vision checked? Do I need a bone density test to check for weak bones (osteoporosis)? Would it help to use a cane or a walker? Where to find more information Centers for Disease Control and Prevention, STEADI: TonerPromos.no Community-Based Fall Prevention Programs: TonerPromos.no [employer] on Aging: BaseRingTones.pl Contact a health care provider if: You fall at home. You are afraid of falling at home. You feel weak, drowsy, or dizzy. This information is not intended to replace advice given to you by your health care provider. Make sure you discuss any questions you have with your health care provider. Document Revised: 10/06/2021 Document Reviewed: 10/06/2021 Elsevier Patient Education  2024 Else[employer] Inc.   Managing Pain Without Opioids Opioids are strong medicines used to treat moderate to severe pain. For some people, especially those  who have long-term (chronic) pain, opioids may not be the best choice for pain management due to: Side effects like nausea, constipation, and sleepiness. The risk of addiction (opioid use disorder). The longer you take opioids, the greater your risk of addiction. Pain that lasts for more than 3 months is called chronic pain. Managing chronic pain usually requires more than one approach and is often provided by a team of health care providers working together (multidisciplinary approach). Pain management may be done at a pain management center or pain clinic. How to manage pain without the use of opioids Use non-opioid medicines Non-opioid medicines for pain may include: Over-the-counter or prescription non-steroidal anti-inflammatory drugs (NSAIDs). These may be the first medicines used for pain. They work well for muscle and bone pain, and they reduce swelling. Acetaminophen. This over-the-counter medicine may work well for milder pain but not swelling. Antidepressants. These may be used to treat chronic pain. A certain type of antidepressant (tricyclics) is often used. These medicines are given in lower doses for pain than when used for depression. Anticonvulsants. These are usually used to treat seizures but may also reduce nerve (neuropathic) pain. Muscle relaxants. These relieve pain caused by sudden muscle tightening (  spasms). You may also use a pain medicine that is applied to the skin as a patch, cream, or gel (topical analgesic), such as a numbing medicine. These may cause fewer side effects than medicines taken by mouth. Do certain therapies as directed Some therapies can help with pain management. They include: Physical therapy. You will do exercises to gain strength and flexibility. A physical therapist may teach you exercises to move and stretch parts of your body that are weak, stiff, or painful. You can learn these exercises at physical therapy visits and practice them at home. Physical  therapy may also involve: Massage. Heat wraps or applying heat or cold to affected areas. Electrical signals that interrupt pain signals (transcutaneous electrical nerve stimulation, TENS). Weak lasers that reduce pain and swelling (low-level laser therapy). Signals from your body that help you learn to regulate pain (biofeedback). Occupational therapy. This helps you to learn ways to function at home and work with less pain. Recreational therapy. This involves trying new activities or hobbies, such as a physical activity or drawing. Mental health therapy, including: Cognitive behavioral therapy (CBT). This helps you learn coping skills for dealing with pain. Acceptance and commitment therapy (ACT) to change the way you think and react to pain. Relaxation therapies, including muscle relaxation exercises and mindfulness-based stress reduction. Pain management counseling. This may be individual, family, or group counseling.  Receive medical treatments Medical treatments for pain management include: Nerve block injections. These may include a pain blocker and anti-inflammatory medicines. You may have injections: Near the spine to relieve chronic back or neck pain. Into joints to relieve back or joint pain. Into nerve areas that supply a painful area to relieve body pain. Into muscles (trigger point injections) to relieve some painful muscle conditions. A medical device placed near your spine to help block pain signals and relieve nerve pain or chronic back pain (spinal cord stimulation device). Acupuncture. Follow these instructions at home Medicines Take over-the-counter and prescription medicines only as told by your health care provider. If you are taking pain medicine, ask your health care providers about possible side effects to watch out for. Do not drive or use heavy machinery while taking prescription opioid pain medicine. Lifestyle  Do not use drugs or alcohol to reduce pain. If  you drink alcohol, limit how much you have to: 0-1 drink a day for women who are not pregnant. 0-2 drinks a day for men. Know how much alcohol is in a drink. In the [place] one drink equals one 12 oz bottle of beer (355 mL), one 5 oz glass of wine (148 mL), or one 1 oz glass of hard liquor (44 mL). Do not use any products that contain nicotine or tobacco. These products include cigarettes, chewing tobacco, and vaping devices, such as e-cigarettes. If you need help quitting, ask your health care provider. Eat a healthy diet and maintain a healthy weight. Poor diet and excess weight may make pain worse. Eat foods that are high in fiber. These include fresh fruits and vegetables, whole grains, and beans. Limit foods that are high in fat and processed sugars, such as fried and sweet foods. Exercise regularly. Exercise lowers stress and may help relieve pain. Ask your health care provider what activities and exercises are safe for you. If your health care provider approves, join an exercise class that combines movement and stress reduction. Examples include yoga and tai chi. Get enough sleep. Lack of sleep may make pain worse. Lower stress as much as possible.  Practice stress reduction techniques as told by your therapist. General instructions Work with all your pain management providers to find the treatments that work best for you. You are an important member of your pain management team. There are many things you can do to reduce pain on your own. Consider joining an online or in-person support group for people who have chronic pain. Keep all follow-up visits. This is important. Where to find more information You can find more information about managing pain without opioids from: American Academy of Pain Medicine: painmed.org Institute for Chronic Pain: instituteforchronicpain.org American Chronic Pain Association: theacpa.org Contact a health care provider if: You have side effects from pain  medicine. Your pain gets worse or does not get better with treatments or home therapy. You are struggling with anxiety or depression. Summary Many types of pain can be managed without opioids. Chronic pain may respond better to pain management without opioids. Pain is best managed when you and a team of health care providers work together. Pain management without opioids may include non-opioid medicines, medical treatments, physical therapy, mental health therapy, and lifestyle changes. Tell your health care providers if your pain gets worse or is not being managed well enough. This information is not intended to replace advice given to you by your health care provider. Make sure you discuss any questions you have with your health care provider. Document Revised: 05/15/2020 Document Reviewed: 05/15/2020 Elsevier Patient Education  2024 [employer].

## 2023-04-09 NOTE — Progress Notes (Signed)
Subjective:   Ruth [name] is a 83 y.o. who presents for a Medicare Wellness preventive visit.  Visit Complete: Virtual I connected with  Ruth [name] on 04/09/23 by a audio enabled telemedicine application and verified that I am speaking with the correct person using two identifiers.  Patient Location: Home  Provider Location: Home Office  I discussed the limitations of evaluation and management by telemedicine. The patient expressed understanding and agreed to proceed.  Vital Signs: Because this visit was a virtual/telehealth visit, some criteria may be missing or patient reported. Any vitals not documented were not able to be obtained and vitals that have been documented are patient reported.  VideoDeclined- This patient declined Librarian, academic. Therefore the visit was completed with audio only.  AWV Questionnaire: No: Patient Medicare AWV questionnaire was not completed prior to this visit.  Cardiac Risk Factors include: advanced age (>42men, >75 women);dyslipidemia;sedentary lifestyle;obesity (BMI >30kg/m2);Other (see comment), Risk factor comments: OSA     Objective:    Today's Vitals   04/09/23 1050 04/09/23 1053  Weight: 183 lb (83 kg)   Height: 5\' 5"  (1.651 m)   PainSc:  6    Body mass index is 30.45 kg/m.     04/09/2023   10:49 AM 01/18/2023    8:10 AM 11/11/2022    8:48 AM 11/09/2022   11:54 AM 09/02/2022    4:33 PM 04/03/2022   11:01 AM 04/25/2021   11:48 AM  Advanced Directives  Does Patient Have a Medical Advance Directive? No;Yes Yes Yes Yes No Yes Yes  Type of Special educational needs teacher of [place];Living will  Healthcare Power of [place];Living will  Healthcare Power of [employer] of [place];Living will  Does patient want to make changes to medical advance directive?    No - Patient declined  No - Patient declined   Copy of Healthcare Power of Attorney in Chart?  Yes - validated most recent copy  scanned in chart (See row information) No - copy requested No - copy requested  No - copy requested   Would patient like information on creating a medical advance directive? No - Patient declined    No - Patient declined      Current Medications (verified) Outpatient Encounter Medications as of 04/09/2023  Medication Sig   acetaminophen (TYLENOL) 500 MG tablet Take 1,000 mg by mouth every 6 (six) hours as needed for moderate pain.   acidophilus (RISAQUAD) CAPS capsule Take 1 capsule by mouth 4 (four) times a week.   albuterol (VENTOLIN HFA) 108 (90 Base) MCG/ACT inhaler Inhale 2 puffs into the lungs every 6 (six) hours as needed for wheezing.   Ascorbic Acid (VITAMIN C PO) Take 1 tablet by mouth 4 (four) times a week.   b complex vitamins capsule Take 1 capsule by mouth 4 (four) times a week.   dicyclomine (BENTYL) 10 MG capsule Take 1 capsule (10 mg total) by mouth 3 (three) times daily as needed. For stomach cramps   famotidine (PEPCID) 40 MG tablet Take 1 tablet (40 mg total) by mouth daily.   FLUoxetine (PROZAC) 10 MG capsule Take 2 capsules by mouth twice daily   FLUoxetine (PROZAC) 20 MG capsule TAKE 1 CAPSULES BY MOUTH TWICE DAILY 40 mg total   fluticasone (CUTIVATE) 0.05 % cream Apply 1 application  topically daily as needed (irritation).   fluticasone (FLONASE) 50 MCG/ACT nasal spray Place 2 sprays into both nostrils daily.   ketoconazole (NIZORAL) 2 %  cream Apply 1 application topically daily. (Patient taking differently: Apply 1 application  topically daily as needed for irritation.)   lidocaine-prilocaine (EMLA) cream Apply 1 Application topically as needed.   nitroGLYCERIN (NITROSTAT) 0.4 MG SL tablet as needed.   ondansetron (ZOFRAN) 4 MG tablet Take 1 tablet (4 mg total) by mouth every 8 (eight) hours as needed for nausea or vomiting.   oxyCODONE (ROXICODONE) 5 MG immediate release tablet 1/2 to 1 q 6 hours prn pain   Polyvinyl Alcohol (LUBRICANT DROPS OP) Place 2 drops into both  eyes 3 (three) times daily as needed (for dry eyes).   promethazine-dextromethorphan (PROMETHAZINE-DM) 6.25-15 MG/5ML syrup Take 5 mLs by mouth 4 (four) times daily as needed for cough.   No facility-administered encounter medications on file as of 04/09/2023.    Allergies (verified) Blood-group specific substance, Ciprofloxacin, Codeine, Latex, Sertraline hcl, Vicodin [hydrocodone-acetaminophen], and Tramadol   History: Past Medical History:  Diagnosis Date   Anemia    Anxiety    Arthritis    Asthma    Complication of anesthesia    woke up during surgery and hard to wake up, laryngospasms   Depression    Eczema    Fatty liver    Fibromyalgia    GERD (gastroesophageal reflux disease)    EGD/ colon 1/09   H pylori ulcer 1980-1990   s/p treatment   History of kidney stones    Hyperlipidemia    IBS (irritable bowel syndrome)    Kidney cysts    Laryngospasm    Neck pain    OSA (obstructive sleep apnea)    pt havin gsleep study  late part of 09/2022   Pneumonia 2013   PONV (postoperative nausea and vomiting)    Pre-diabetes    Psoriasis 2023   dx by [facility] per patient   Refusal of blood transfusions as patient is Jehovah's Witness    Past Surgical History:  Procedure Laterality Date   BILATERAL SALPINGOOPHORECTOMY  1990s   abdominal   BIOPSY  09/11/2020   Procedure: BIOPSY;  Surgeon: Malissa Hippo, MD;  [facility];  Service: Endoscopy;;  gastric   BIOPSY  11/11/2022   Procedure: BIOPSY;  Surgeon: Dolores Frame, MD;  [facility];  Service: Gastroenterology;;   BIOPSY  01/18/2023   Procedure: BIOPSY;  Surgeon: Lemar Lofty., MD;  Location[facility];  Service: Gastroenterology;;   CARDIAC CATHETERIZATION  2002   [facility]) normal coronary arteries    COLONOSCOPY  01/2007   Dr. Laurie Panda   COLONOSCOPY N/A 04/18/2015   Procedure: COLONOSCOPY;  Surgeon: Malissa Hippo, MD;  [facility];   Service: Endoscopy;  Laterality: N/A;  830 - moved to 8:55 - Ann to notify pt   COLONOSCOPY WITH ESOPHAGOGASTRODUODENOSCOPY (EGD)  02/16/2012   Procedure: COLONOSCOPY WITH ESOPHAGOGASTRODUODENOSCOPY (EGD);  Surgeon: Corbin Ade, MD;  [facility];  Service: Endoscopy;  Laterality: N/A;  8:45   COLONOSCOPY WITH PROPOFOL N/A 09/11/2020   Procedure: COLONOSCOPY WITH PROPOFOL;  Surgeon: Malissa Hippo, MD;  [facility];  Service: Endoscopy;  Laterality: N/A;  10:55   ESOPHAGEAL DILATION N/A 06/07/2014   Procedure: ESOPHAGEAL DILATION;  Surgeon: Malissa Hippo, MD;  [facility];  Service: Endoscopy;  Laterality: N/A;   ESOPHAGEAL DILATION N/A 09/11/2020   Procedure: ESOPHAGEAL DILATION;  Surgeon: Malissa Hippo, MD;  [facility];  Service: Endoscopy;  Laterality: N/A;   ESOPHAGOGASTRODUODENOSCOPY  01/2007  Dr Jarold Motto- 3 cm hiatal hernia, benign esophageal biopsies, erosive esophagitis, gastritis   ESOPHAGOGASTRODUODENOSCOPY N/A 06/07/2014   Procedure: ESOPHAGOGASTRODUODENOSCOPY (EGD);  Surgeon: Malissa Hippo, MD;  [facility];  Service: Endoscopy;  Laterality: N/A;  1200   ESOPHAGOGASTRODUODENOSCOPY N/A 08/18/2017   Procedure: ESOPHAGOGASTRODUODENOSCOPY (EGD);  Surgeon: Malissa Hippo, MD;  [facility];  Service: Endoscopy;  Laterality: N/A;  2:20   ESOPHAGOGASTRODUODENOSCOPY (EGD) WITH PROPOFOL N/A 09/11/2020   Procedure: ESOPHAGOGASTRODUODENOSCOPY (EGD) WITH PROPOFOL;  Surgeon: Malissa Hippo, MD;  [facility];  Service: Endoscopy;  Laterality: N/A;   ESOPHAGOGASTRODUODENOSCOPY (EGD) WITH PROPOFOL N/A 11/11/2022   Procedure: ESOPHAGOGASTRODUODENOSCOPY (EGD) WITH PROPOFOL;  Surgeon: Dolores Frame, MD;  [facility];  Service: Gastroenterology;  Laterality: N/A;  7:30am;asa 3   ESOPHAGOGASTRODUODENOSCOPY (EGD) WITH PROPOFOL N/A 01/18/2023   Procedure: ESOPHAGOGASTRODUODENOSCOPY  (EGD) WITH PROPOFOL;  Surgeon: Meridee Score Netty Starring., MD;  [facility]: [facility];  Service: Gastroenterology;  Laterality: N/A;   EUS N/A 01/18/2023   Procedure: UPPER ENDOSCOPIC ULTRASOUND (EUS) RADIAL;  Surgeon: Lemar Lofty., MD;  Location: [facility];  Service: Gastroenterology;  Laterality: N/A;   FOOT SURGERY Left 2005   hammer toe   INNER EAR SURGERY     Left   KNEE SURGERY Left    x 2   POLYPECTOMY  09/11/2020   Procedure: POLYPECTOMY;  Surgeon: Malissa Hippo, MD;  [facility];  Service: Endoscopy;;   SHOULDER ARTHROSCOPY WITH ROTATOR CUFF REPAIR Right 05/07/2021   Procedure: SHOULDER ARTHROSCOPY WITH MANIPULATION UNDER ANESTHESIA ,SUBACROMIAL DECOMPRESSION, DISTAL CLAVICLE RESECTION, ROTATOR CUFF REPAIR , BICEPS TENOTOMY;  Surgeon: Eugenia Mcalpine, MD;  [facility]: [facility];  Service: Orthopedics;  Laterality: Right;  WITH INTERSCALENE BLOCK NEEDS PRE OP CONSULT  WITH ANESTHESIA 120   SHOULDER SURGERY Left 2011   TONSILLECTOMY     age 48   TOTAL KNEE ARTHROPLASTY Left 02/07/2018   Procedure: TOTAL KNEE ARTHROPLASTY;  Surgeon: Ollen Gross, MD;  [facility];  Service: Orthopedics;  Laterality: Left;    TUBAL LIGATION  1970   with an appy   TYMPANOSTOMY TUBE PLACEMENT  1984   VAGINAL HYSTERECTOMY  1970   Family History  Problem Relation Age of Onset   Paranoid behavior Mother        depression   Colon polyps Mother    Eczema Mother    Depression Mother    Diabetes Father    Heart attack Father        81's   Lung cancer Father    Cirrhosis Father 36       etoh cirrhosis   Eczema Father    Lung cancer Sister    Depression Sister    Alzheimer's disease Sister    Hypertension Sister    Diabetes Sister    Lung cancer Sister    Sleep apnea Sister    Depression Maternal Aunt    COPD Daughter    Drug abuse Niece    Suicidality Niece    Allergic rhinitis Neg Hx    Asthma Neg Hx    Urticaria Neg Hx    Social History    Socioeconomic History   Marital status: Married    Spouse name: Molly Maduro   Number of children: 2   Years of education: Not on file   Highest education level: Some college, no degree  Occupational History   Occupation: Retired; Engineer, agricultural  Tobacco Use   Smoking status: Never  Passive exposure: Past   Smokeless tobacco: Never  Vaping Use   Vaping status: Never Used  Substance and Sexual Activity   Alcohol use: Yes    Alcohol/week: 0.0 standard drinks of alcohol    Comment: per patient very seldom   Drug use: No   Sexual activity: Not Currently    Partners: Male    Comment: Hysterectomy  Other Topics Concern   Not on file  Social History Narrative   Married 2 grown children, 1 adopted son-autistic-lives next door.   1 granddaughter   2 step grandsons   Social Drivers of Corporate investment banker Strain: Low Risk  (04/09/2023)   Overall Financial Resource Strain (CARDIA)    Difficulty of Paying Living Expenses: Not hard at all  Food Insecurity: Patient Declined (04/09/2023)   Hunger Vital Sign    Worried About Running Out of Food in the Last Year: Patient declined    Ran Out of Food in the Last Year: Patient declined  Transportation Needs: Patient Declined (04/09/2023)   PRAPARE - Administrator, Civil Service (Medical): Patient declined    Lack of Transportation (Non-Medical): Patient declined  Physical Activity: Insufficiently Active (04/09/2023)   Exercise Vital Sign    Days of Exercise per Week: 2 days    Minutes of Exercise per Session: 20 min  Stress: Stress Concern Present (04/09/2023)   [employer] of Occupational Health - Occupational Stress Questionnaire    Feeling of Stress : Rather much  Social Connections: Moderately Integrated (04/09/2023)   Social Connection and Isolation Panel [NHANES]    Frequency of Communication with Friends and Family: More than three times a week    Frequency of Social Gatherings with Friends and Family: More  than three times a week    Attends Religious Services: More than 4 times per year    Active Member of [employer] or Organizations: No    Attends Engineer, structural: Never    Marital Status: Married    Tobacco Counseling Counseling given: Yes    Clinical Intake:  Pre-visit preparation completed: Yes  Pain : 0-10 Pain Score: 6  Pain Type: Chronic pain Pain Location: Neck Pain Orientation: Posterior Pain Radiating Towards: shoulders/shoulder blades Pain Descriptors / Indicators: Constant Pain Onset: More than a month ago Pain Frequency: Constant     BMI - recorded: 30.45 Nutritional Status: BMI > 30  Obese Nutritional Risks: None Diabetes: No  How often do you need to have someone help you when you read instructions, pamphlets, or other written materials from your doctor or pharmacy?: 1 - Never  Interpreter Needed?: No  Information entered by :: Maryjean Ka CMA   Activities of Daily Living     04/09/2023   11:04 AM 11/09/2022   11:55 AM  In your present state of health, do you have any difficulty performing the following activities:  Hearing? 1   Vision? 0   Difficulty concentrating or making decisions? 0   Walking or climbing stairs? 1   Dressing or bathing? 1   Comment husband helps her.   Doing errands, shopping? 0 0  Comment husband likes to do these things for her, but if she had to she could do them alone   Preparing Food and eating ? N   Using the Toilet? N   In the past six months, have you accidently leaked urine? Y   Do you have problems with loss of bowel control? N   Managing your Medications? N  Managing your Finances? N   Housekeeping or managing your Housekeeping? Y     Patient Care Team: Babs Sciara, MD as PCP - General (Family Medicine) Wendall Stade, MD as PCP - Cardiology (Cardiology) Jena Gauss Gerrit Friends, MD (Gastroenterology) [facility] Pollyann Savoy, MD as Consulting Physician (Rheumatology) Marguerita Beards, MD as Consulting Physician (Obstetrics and Gynecology) Francena Hanly, MD as Consulting Physician (Orthopedic Surgery)  Indicate any recent Medical Services you may have received from other than [facility] providers in the past year (date may be approximate).     Assessment:   This is a routine wellness examination for [place].  Hearing/Vision screen Hearing Screening - Comments:: Patient states she does have some difficulty hearing bilateral.  LT&gt;RT Declines referral today   Vision Screening - Comments:: Wears rx glasses - up to date with routine eye exams  Sees Dr. Dione Booze in [place]   Goals Addressed             This Visit's Progress    Patient Stated       To remain healthy and active        Depression Screen     04/09/2023   11:08 AM 08/26/2022   11:30 AM 07/15/2022    4:18 PM 05/08/2022    1:20 PM 04/03/2022   11:08 AM 02/03/2022    9:53 AM 01/13/2022   11:48 AM  PHQ 2/9 Scores  PHQ - 2 Score 0 2 2 0 3 3 0  PHQ- 9 Score 0 10 10 7 10 14 9     Fall Risk     04/09/2023   11:04 AM 08/26/2022   11:30 AM 07/15/2022    4:18 PM 04/03/2022   11:00 AM 01/13/2022   11:48 AM  Fall Risk   Falls in the past year? 0 0 0 0 0  Number falls in past yr: 0 0 0 0 0  Injury with Fall? 0 0 0 0 0  Risk for fall due to : Impaired balance/gait;Impaired mobility;Orthopedic patient    No Fall Risks  Follow up Falls prevention discussed;Falls evaluation completed   Falls prevention discussed;Education provided;Falls evaluation completed Falls evaluation completed    MEDICARE RISK AT HOME:  Medicare Risk at Home Any stairs in or around the home?: No If so, are there any without handrails?: No Home free of loose throw rugs in walkways, pet beds, electrical cords, etc?: Yes Adequate lighting in your home to reduce risk of falls?: Yes Life alert?: No Use of a cane, walker or w/c?: Yes Grab bars in the bathroom?: Yes Shower chair or bench in shower?: Yes Elevated toilet seat  or a handicapped toilet?: No  TIMED UP AND GO:  Was the test performed?  No  Cognitive Function: 6CIT completed        04/09/2023   11:00 AM 04/03/2022   11:01 AM 02/25/2021    1:35 PM  6CIT Screen  What Year? 0 points 0 points 0 points  What month? 0 points 0 points 0 points  What time? 0 points 0 points 0 points  Count back from 20 0 points 0 points 0 points  Months in reverse 0 points 0 points 0 points  Repeat phrase 0 points 0 points 0 points  Total Score 0 points 0 points 0 points    Immunizations Immunization History  Administered Date(s) Administered   Influenza,inj,Quad PF,6+ Mos 12/15/2017, 11/15/2018   Influenza,inj,quad, With Preservative 11/16/2016   Influenza-Unspecified 11/22/2015, 11/01/2019, 11/30/2020, 11/26/2021, 11/17/2022  Moderna Sars-Covid-2 Vaccination 03/24/2019, 04/22/2019, 11/18/2019, 06/02/2020   Pneumococcal Conjugate-13 02/15/2014   Pneumococcal Polysaccharide-23 01/25/2020   Zoster Recombinant(Shingrix) 07/10/2016    Screening Tests Health Maintenance  Topic Date Due   DTaP/Tdap/Td (1 - Tdap) Never done   Zoster Vaccines- Shingrix (2 of 2) 09/04/2016   COVID-19 Vaccine (5 - 2024-25 season) 10/18/2022   MAMMOGRAM  05/22/2023   Medicare Annual Wellness (AWV)  04/08/2024   DEXA SCAN  05/21/2024   Pneumonia Vaccine 21+ Years old  Completed   INFLUENZA VACCINE  Completed   HPV VACCINES  Aged Out   Colonoscopy  Discontinued    Health Maintenance  Health Maintenance Due  Topic Date Due   DTaP/Tdap/Td (1 - Tdap) Never done   Zoster Vaccines- Shingrix (2 of 2) 09/04/2016   COVID-19 Vaccine (5 - 2024-25 season) 10/18/2022   Health Maintenance Items Addressed: Mammogram ordered  Additional Screening:  Vision Screening: Recommended annual ophthalmology exams for early detection of glaucoma and other disorders of the eye.  Dental Screening: Recommended annual dental exams for proper oral hygiene  Community Resource Referral / Chronic  Care Management: CRR required this visit?  No   CCM required this visit?  No     Plan:     I have personally reviewed and noted the following in the patient's chart:   Medical and social history Use of alcohol, tobacco or illicit drugs  Current medications and supplements including opioid prescriptions. Patient is currently taking opioid prescriptions. Information provided to patient regarding non-opioid alternatives. Patient advised to discuss non-opioid treatment plan with their provider. Functional ability and status Nutritional status Physical activity Advanced directives List of other physicians Hospitalizations, surgeries, and ER visits in previous 12 months Vitals Screenings to include cognitive, depression, and falls Referrals and appointments  In addition, I have reviewed and discussed with patient certain preventive protocols, quality metrics, and best practice recommendations. A written personalized care plan for preventive services as well as general preventive health recommendations were provided to patient.     Jordan Hawks Orson Rho, CMA   04/09/2023   After Visit Summary: (MyChart) Due to this being a telephonic visit, the after visit summary with patients personalized plan was offered to patient via MyChart   Notes: Please refer to Routing Comments.

## 2023-04-13 ENCOUNTER — Ambulatory Visit: Payer: Self-pay | Admitting: Family Medicine

## 2023-04-13 NOTE — Telephone Encounter (Signed)
  Chief Complaint: Pain Symptoms: Fibromyalgia Pain-worse in ribs and shoulder blades Frequency: Almost three weeks ago.  Pertinent Negatives: Patient denies CP, SOB Disposition: [] ED /[] Urgent Care (no appt availability in office) / [x] Appointment(In office/virtual)/ []  [place] [facility]/ [] Home Care/ [] Refused Recommended Disposition /[] [place] [facility]/ []  Follow-up with PCP Additional Notes: patient with hx of fibromyalgia complaints of increases in pain. Patient endorses pain to ribs and shoulder blades are the worst. Patient states she was [place] the [place] about three weeks ago and almost lost her balance and ended up using her husband's car to lean on. Patient believes pain got irritated after that. Per protocol, the recommendation is for an appointment. Office visit is available 04/14/2023 at 2:50 pm with patient's PCP. Patient states she will take that appointment. Patient verbalized understanding of plan and all questions answered.     Copied from CRM [id number]. Topic: Clinical - Red Word Triage >> Apr 13, 2023  3:07 PM [name] wrote: Red Word that prompted transfer to Nurse Triage: Patient is having some pain due to fibromyalgia. The pain is in her ribs and shoulder blades. Reason for Disposition  Body pains are a chronic symptom (recurrent or ongoing AND present > 4 weeks)  Answer Assessment - Initial Assessment Questions 1. ONSET: "When did the muscle aches or body pains start?"      Patient endorses that she almost fell two Saturdays ago and believes that her pain increased since then. Patient states she never lost her balance.  2. LOCATION: "What part of your body is hurting?" (e.g., entire body, arms, legs)      Entire body 3. SEVERITY: "How bad is the pain?" (Scale 1-10; or mild, moderate, severe)   - MILD (1-3): doesn't interfere with normal activities    - MODERATE (4-7): interferes with normal activities or awakens from sleep    - SEVERE (8-10):  excruciating  pain, unable to do any normal activities      Patient endorses pain level of 9 earlier today but reports taking medication. Current pain level is 7 4. CAUSE: "What do you think is causing the pains?"     fibromyalgia 5. FEVER: "Have you been having fever?"     No 6. OTHER SYMPTOMS: "Do you have any other symptoms?" (e.g., chest pain, weakness, rash, cold or flu symptoms, weight loss)     no 8. TRAVEL: "Have you traveled out of the country in the last month?" (e.g., travel history, exposures)     No  Protocols used: Muscle Aches and Body Pain-A-AH

## 2023-04-13 NOTE — Telephone Encounter (Signed)
 Noted.

## 2023-04-14 ENCOUNTER — Ambulatory Visit (INDEPENDENT_AMBULATORY_CARE_PROVIDER_SITE_OTHER): Payer: HMO | Admitting: Family Medicine

## 2023-04-14 ENCOUNTER — Encounter: Payer: Self-pay | Admitting: Family Medicine

## 2023-04-14 ENCOUNTER — Other Ambulatory Visit: Payer: Self-pay | Admitting: Family Medicine

## 2023-04-14 VITALS — BP 113/73 | HR 97 | Temp 98.1°F | Ht 65.0 in | Wt 192.0 lb

## 2023-04-14 DIAGNOSIS — Z5181 Encounter for therapeutic drug level monitoring: Secondary | ICD-10-CM

## 2023-04-14 DIAGNOSIS — Z79891 Long term (current) use of opiate analgesic: Secondary | ICD-10-CM

## 2023-04-14 DIAGNOSIS — Z79899 Other long term (current) drug therapy: Secondary | ICD-10-CM | POA: Diagnosis not present

## 2023-04-14 DIAGNOSIS — M797 Fibromyalgia: Secondary | ICD-10-CM | POA: Diagnosis not present

## 2023-04-14 DIAGNOSIS — M7918 Myalgia, other site: Secondary | ICD-10-CM

## 2023-04-14 MED ORDER — OXYCODONE HCL 5 MG PO TABS: ORAL_TABLET | ORAL | 0 refills | Status: DC

## 2023-04-14 MED ORDER — PREGABALIN 25 MG PO CAPS: 25.0000 mg | ORAL_CAPSULE | Freq: Two times a day (BID) | ORAL | 3 refills | Status: DC

## 2023-04-14 NOTE — Progress Notes (Unsigned)
 Subjective:    [name], female    DOB: 11/25/1940, 83 y.o.   MRN: [id number]  HPI Long discussion held with the patient regarding her symptomatology She has severe fibromyalgia where she aches for no good reason in the arms legs she has seen rheumatology in the past We have tried various measures She states the Prozac does seem to help She is interested in trying low-dose Lyrica after our discussion  This patient was seen today for chronic pain  The medication list was reviewed and updated.   Location of Pain for which the patient has been treated with regarding narcotics: Bilateral knee pain ankle pain  Onset of this pain: Present for several years   -Compliance with medication: Good compliance  - Number patient states they take daily: Averages about 8 to 10 tablets/month  -Reason for ongoing use of opioids unable to get adequate response with NSAIDs or Tylenol  What other measures have been tried outside of opioids NSAIDs Tylenol, medication for fibromyalgia  In the ongoing specialists regarding this condition has seen orthopedics intermittently  -when was the last dose patient took?  Within the past 5 days  The patient was advised the importance of maintaining medication and not using illegal substances with these.  Here for refills and follow up  The patient was educated that we can provide 3 monthly scripts for their medication, it is their responsibility to follow the instructions.  Side effects or complications from medications: Denies side effects  Patient is aware that pain medications are meant to minimize the severity of the pain to allow their pain levels to improve to allow for better function. They are aware of that pain medications cannot totally remove their pain.  Due for UDT ( at least once per year) (pain management contract is also completed at the time of the UDT): Today  Scale of 1 to 10 ( 1 is least 10 is most) Your pain level  without the medicine: 9 Your pain level with medication 5  Scale 1 to 10 ( 1-helps very little, 10 helps very well) How well does your pain medication reduce your pain so you can function better through out the day? 8  Quality of the pain: Throbbing aching  Persistence of the pain: Present all the time  Modifying factors: Worse with activity     She does utilize oxycodone for her knee hip and ankle pain currently right now she needs surgery but cannot get it done currently she uses oxycodone sparingly denies drowsiness we have agreed upon no more than 10 pills/month  Patient does have depression but not suicidal states Prozac seems to be helping   Review of Systems     Objective:   Physical Exam General-in no acute distress Eyes-no discharge Lungs-respiratory rate normal, CTA CV-no murmurs,RRR Extremities skin warm dry no edema Neuro grossly normal Behavior normal, alert Osteoarthritis noted in the knees and ankles       Assessment & Plan:   1. High risk medication use (Primary) Test ordered - ToxASSURE Select 13 (MW), Urine  2. Fibromyalgia We will try Lyrica start off low-dose gradually titrate up side effects discussed if problems stop medicine  3. Musculoskeletal pain Hopefully Lyrica will help  4. Encounter for monitoring opioid maintenance therapy The patient was seen in followup for chronic pain. A review over at their current pain status was discussed. Drug registry was checked. Prescriptions were given.  Regular follow-up recommended. Discussion was held regarding the  importance of compliance with medication as well as pain medication contract.  Patient was informed that medication may cause drowsiness and should not be combined  with other medications/alcohol or street drugs. If the patient feels medication is causing altered alertness then do not drive or operate dangerous equipment.  Should be noted that the patient appears to be meeting appropriate  use of opioids and response.  Evidenced by improved function and decent pain control without significant side effects and no evidence of overt aberrancy issues.  Upon discussion with the patient today they understand that opioid therapy is optional and they feel that the pain has been refractory to reasonable conservative measures and is significant and affecting quality of life enough to warrant ongoing therapy and wishes to continue opioids.  Refills were provided.  [facility] medical Board guidelines regarding the pain medicine has been reviewed.  CDC guidelines most updated 2022 has been reviewed by the prescriber.  PDMP is checked on a regular basis yearly urine drug screen and pain management contract  Treatment plan for this patient includes #1-gentle stretching exercises as shown daily basis 2.  Mild strength exercises 3 times per week #3 continue pain medications #4 notify us if any digression

## 2023-04-15 LAB — MED LIST OPTION NOT SELECTED

## 2023-04-17 ENCOUNTER — Encounter: Payer: Self-pay | Admitting: Family Medicine

## 2023-04-17 DIAGNOSIS — R718 Other abnormality of red blood cells: Secondary | ICD-10-CM

## 2023-04-17 HISTORY — DX: Other abnormality of red blood cells: R71.8

## 2023-04-17 LAB — TOXASSURE SELECT 13 (MW), URINE

## 2023-04-17 LAB — SPECIMEN STATUS REPORT

## 2023-05-12 ENCOUNTER — Ambulatory Visit: Payer: No Typology Code available for payment source | Admitting: Family Medicine

## 2023-05-12 ENCOUNTER — Encounter: Payer: Self-pay | Admitting: Family Medicine

## 2023-05-12 VITALS — BP 119/70 | HR 60 | Temp 98.0°F | Ht 65.0 in | Wt 192.0 lb

## 2023-05-12 DIAGNOSIS — M19071 Primary osteoarthritis, right ankle and foot: Secondary | ICD-10-CM | POA: Diagnosis not present

## 2023-05-12 DIAGNOSIS — M797 Fibromyalgia: Secondary | ICD-10-CM

## 2023-05-12 DIAGNOSIS — M19072 Primary osteoarthritis, left ankle and foot: Secondary | ICD-10-CM | POA: Diagnosis not present

## 2023-05-12 DIAGNOSIS — Z5181 Encounter for therapeutic drug level monitoring: Secondary | ICD-10-CM

## 2023-05-12 DIAGNOSIS — Z79891 Long term (current) use of opiate analgesic: Secondary | ICD-10-CM

## 2023-05-12 DIAGNOSIS — M7918 Myalgia, other site: Secondary | ICD-10-CM

## 2023-05-12 MED ORDER — OXYCODONE HCL 5 MG PO TABS: ORAL_TABLET | ORAL | 0 refills | Status: DC

## 2023-05-12 NOTE — Progress Notes (Signed)
 Ruth [name] yeah  Subjective:    [name], female    DOB: [birth year], 83 y.o.   MRN: [id number]  HPI This patient was seen today for chronic pain  The medication list was reviewed and updated.   Location of Pain for which the patient has been treated with regarding narcotics: fibromyalgia, shoulders, feet, hands, elbows, patient has severe osteoarthritis, Tylenol alone does not adequately alleviate her trouble  Onset of this pain: Present for years   -Compliance with medication: Uses medication very sporadically is compliant  - Number patient states they take daily: 1/2 pill  -Reason for ongoing use of opioids fibromyalgia, knees, shoulder, feet, hand, elbows, neck and hip pain   What other measures have been tried outside of opioids surgery, injections, tylenol, physical therapy, home stretches, essential oils and rubs  In the ongoing specialists regarding this condition none currently  -when was the last dose patient took? 05/11/23  The patient was advised the importance of maintaining medication and not using illegal substances with these.  Here for refills and follow up  The patient was educated that we can provide 3 monthly scripts for their medication, it is their responsibility to follow the instructions.  Side effects or complications from medications: No side effects  Patient is aware that pain medications are meant to minimize the severity of the pain to allow their pain levels to improve to allow for better function. They are aware of that pain medications cannot totally remove their pain.  Due for UDT ( at least once per year) (pain management contract is also completed at the time of the UDT): completed 04/14/2023  Scale of 1 to 10 ( 1 is least 10 is most) Your pain level without the medicine: 9 Your pain level with medication 1-4  Scale 1 to 10 ( 1-helps very little, 10 helps very well) How well does your pain medication reduce your  pain so you can function better through out the day? 8 when she does take the medicine  Quality of the pain: Severe aching pain  Persistence of the pain: Present all the time  Modifying factors: Worse with activity     Discussed the use of AI scribe software for clinical note transcription with the patient, who gave verbal consent to proceed.  History of Present Illness   Ruth [name] is an 83 year old female with chronic pain and depression who presents with worsening symptoms and medication concerns.  She has been experiencing worsening balance and depression, exacerbated by her use of Lyrica, which she stopped due to increased anxiety and depression. After discontinuing Lyrica, she noticed some improvement in her symptoms, although they have not completely resolved.  She experiences chronic pain, particularly in her knee, shoulder, and due to fibromyalgia. She describes the pain as 'overwhelming' and 'like it's got you down and you can't get up', with a sensation of a tight band around her midsection, shoulders, and ribcage. This pain significantly impacts her ability to perform housework and yard work. She takes fluoxetine, which she feels 'halfway controls' her depression, and oxycodone for pain management, which she uses every other day. Oxycodone helps her function better by calming the pain, although she acknowledges it is not a long-term solution for muscle fatigue or discomfort.  She has a history of osteoarthritis, with a diagnosis of bone-on-bone in her feet. She reports chronic pain in her thumb joint on the left side and has been offered cortisone injections, which  she has not pursued due to concerns about long-term effects.  She has a history of a hammer toe surgery that did not heal well, resulting in persistent pain. A pin was placed in the toe, which was later found to be potentially too long, causing discomfort. She has not pursued further treatment due to concerns about the  unpredictability of outcomes.  She reports a white spot on her lip and inside her mouth, which have been present for varying durations but are not painful. She is uncertain about the appropriate specialist to consult for these lesions.         Review of Systems     Objective:   Physical Exam  General-in no acute distress Eyes-no discharge Lungs-respiratory rate normal, CTA CV-no murmurs,RRR Extremities skin warm dry no edema Neuro grossly normal Behavior normal, alert Arthritis noted in the left ankle  The lip does not show any specific findings other than a little white area on the front of the lip and a small nodule on the back of the lip but does not appear to be cancerous patient will see dermatology    Assessment & Plan:  Assessment and Plan    Fibromyalgia Chronic pain syndrome with widespread musculoskeletal pain, fatigue, and tenderness. Lyrica discontinued due to increased anxiety and depression. Oxycodone provides some relief but poses dependency risk. - Continue fluoxetine for depression management. - Prescribe oxycodone 15 tablets per month for pain management. - Instruct to send a MyChart message in two weeks to report on pain management efficacy.  Osteoarthritis Chronic joint pain in knees and thumb joint, with bone-on-bone contact in feet. Oxycodone provides some relief. Cortisone injections discussed for temporary relief, but she is concerned about long-term effects. Joint replacement is a potential option but is a significant step. - Consider cortisone injections for joint pain management, with frequency limited to avoid long-term damage. - Continue occasional use of oxycodone for joint pain.  Hammer Toe with Post-Surgical Complications Persistent pain in the third toe post-surgery with pin placement. The pin was not removed as planned, causing ongoing discomfort. The pin may be too long, causing pain. - Order up-to-date x-ray of the affected toe to assess  current status of the pin and toe alignment.  Oral Lesions White spots on the lip and inside the mouth, present for several months without pain. No signs of oral cancer observed. - Refer to dermatology for further evaluation of oral lesions.  General Health Maintenance Routine health maintenance and monitoring are necessary. Blood work is pending. - Complete blood work within the next few weeks. - Schedule follow-up visit in three months.

## 2023-05-13 ENCOUNTER — Other Ambulatory Visit: Payer: Self-pay

## 2023-05-13 DIAGNOSIS — M19072 Primary osteoarthritis, left ankle and foot: Secondary | ICD-10-CM

## 2023-05-18 DIAGNOSIS — K5792 Diverticulitis of intestine, part unspecified, without perforation or abscess without bleeding: Secondary | ICD-10-CM

## 2023-05-18 HISTORY — DX: Diverticulitis of intestine, part unspecified, without perforation or abscess without bleeding: K57.92

## 2023-05-19 DIAGNOSIS — R7303 Prediabetes: Secondary | ICD-10-CM | POA: Diagnosis not present

## 2023-05-19 DIAGNOSIS — E785 Hyperlipidemia, unspecified: Secondary | ICD-10-CM | POA: Diagnosis not present

## 2023-05-20 ENCOUNTER — Encounter: Payer: Self-pay | Admitting: Family Medicine

## 2023-05-20 LAB — BASIC METABOLIC PANEL WITH GFR
BUN/Creatinine Ratio: 26 (ref 12–28)
BUN: 18 mg/dL (ref 8–27)
CO2: 27 mmol/L (ref 20–29)
Calcium: 9.5 mg/dL (ref 8.7–10.3)
Chloride: 100 mmol/L (ref 96–106)
Creatinine, Ser: 0.69 mg/dL (ref 0.57–1.00)
Glucose: 91 mg/dL (ref 70–99)
Potassium: 5.3 mmol/L — ABNORMAL HIGH (ref 3.5–5.2)
Sodium: 140 mmol/L (ref 134–144)
eGFR: 87 mL/min/{1.73_m2} (ref >59)

## 2023-05-20 LAB — LIPID PANEL
Chol/HDL Ratio: 4.8 ratio — ABNORMAL HIGH (ref 0.0–4.4)
Cholesterol, Total: 286 mg/dL — ABNORMAL HIGH (ref 100–199)
HDL: 60 mg/dL (ref >39)
LDL Chol Calc (NIH): 206 mg/dL — ABNORMAL HIGH (ref 0–99)
Triglycerides: 115 mg/dL (ref 0–149)
VLDL Cholesterol Cal: 20 mg/dL (ref 5–40)

## 2023-05-20 LAB — HEMOGLOBIN A1C
Est. average glucose Bld gHb Est-mCnc: 123 mg/dL
Hgb A1c MFr Bld: 5.9 % — ABNORMAL HIGH (ref 4.8–5.6)

## 2023-05-24 ENCOUNTER — Other Ambulatory Visit: Payer: Self-pay

## 2023-05-24 ENCOUNTER — Other Ambulatory Visit: Payer: Self-pay | Admitting: Family Medicine

## 2023-05-24 DIAGNOSIS — Z79899 Other long term (current) drug therapy: Secondary | ICD-10-CM

## 2023-05-24 DIAGNOSIS — E875 Hyperkalemia: Secondary | ICD-10-CM

## 2023-05-24 MED ORDER — ROSUVASTATIN CALCIUM 5 MG PO TABS: 5.0000 mg | ORAL_TABLET | Freq: Every day | ORAL | 1 refills | Status: DC

## 2023-06-02 ENCOUNTER — Telehealth: Payer: Self-pay | Admitting: *Deleted

## 2023-06-02 DIAGNOSIS — M25512 Pain in left shoulder: Secondary | ICD-10-CM | POA: Diagnosis not present

## 2023-06-02 DIAGNOSIS — M25811 Other specified joint disorders, right shoulder: Secondary | ICD-10-CM | POA: Diagnosis not present

## 2023-06-02 NOTE — Telephone Encounter (Signed)
   Pre-operative Risk Assessment    Patient Name: Ruth [name]  DOB: May 31, 1940 MRN: [id number]   Date of last office visit: 10/08/22 DR. NISHAN Date of next office visit: NONE   Request for Surgical Clearance    Procedure:   RIGHT REVERSE TOTAL SHOULDER ARTHROPLASTY  Date of Surgery:  Clearance TBD                                Surgeon:  DR. KEVIN SUPPLE Surgeon's Group or Practice Name:  [facility] Phone number:  [phone] [facility] Fax number:  [phone]   Type of Clearance Requested:   - Medical ; NONE INDICATED ON FORM TO BE HELD   Type of Anesthesia:   CHOICE   Additional requests/questions:    [name]   06/02/2023, 2:13 PM

## 2023-06-02 NOTE — Telephone Encounter (Signed)
   Name: Ruth [name]  DOB: 14-Aug-1940  MRN: [id number]  Primary Cardiologist: Janelle Mediate, MD   Preoperative team, please contact this patient and set up a phone call appointment for further preoperative risk assessment. Please obtain consent and complete medication review. Thank you for your help.  I confirm that guidance regarding antiplatelet and oral anticoagulation therapy has been completed and, if necessary, noted below.  None  I also confirmed the patient resides in the state of [place] . As per [facility] telemedicine laws, the patient must reside in the state in which the provider is licensed.   Francene Ing, Retha Cast, NP 06/02/2023, 2:36 PM [facility]

## 2023-06-03 NOTE — Telephone Encounter (Signed)
 1st attempt to reach pt regarding surgical clearance and the need for an TELE appointment.  Left pt a detailed message to call back and get that scheduled.

## 2023-06-04 ENCOUNTER — Telehealth: Payer: Self-pay

## 2023-06-04 NOTE — Addendum Note (Signed)
 Addended by: Allyne Areola on: 06/04/2023 08:50 AM   Modules accepted: Orders

## 2023-06-04 NOTE — Telephone Encounter (Signed)
 Called patient to schedule a tele visit for a pre-op clearance on 06/22/23 @ 10:20, meds rec and consent done.      Patient Consent for Virtual Visit        Ruth [name] has provided verbal consent on 06/04/2023 for a virtual visit (video or telephone).   CONSENT FOR VIRTUAL VISIT FOR:  Ruth [name]  By participating in this virtual visit I agree to the following:  I hereby voluntarily request, consent and authorize [facility] and its employed or contracted physicians, physician assistants, nurse practitioners or other licensed health care professionals (the Practitioner), to provide me with telemedicine health care services (the "Services) as deemed necessary by the treating Practitioner. I acknowledge and consent to receive the Services by the Practitioner via telemedicine. I understand that the telemedicine visit will involve communicating with the Practitioner through live audiovisual communication technology and the disclosure of certain medical information by electronic transmission. I acknowledge that I have been given the opportunity to request an in-person assessment or other available alternative prior to the telemedicine visit and am voluntarily participating in the telemedicine visit.  I understand that I have the right to withhold or withdraw my consent to the use of telemedicine in the course of my care at any time, without affecting my right to future care or treatment, and that the Practitioner or I may terminate the telemedicine visit at any time. I understand that I have the right to inspect all information obtained and/or recorded in the course of the telemedicine visit and may receive copies of available information for a reasonable fee.  I understand that some of the potential risks of receiving the Services via telemedicine include:  Delay or interruption in medical evaluation due to technological equipment failure or disruption; Information transmitted may  not be sufficient (e.g. poor resolution of images) to allow for appropriate medical decision making by the Practitioner; and/or  In rare instances, security protocols could fail, causing a breach of personal health information.  Furthermore, I acknowledge that it is my responsibility to provide information about my medical history, conditions and care that is complete and accurate to the best of my ability. I acknowledge that Practitioner's advice, recommendations, and/or decision may be based on factors not within their control, such as incomplete or inaccurate data provided by me or distortions of diagnostic images or specimens that may result from electronic transmissions. I understand that the practice of medicine is not an exact science and that Practitioner makes no warranties or guarantees regarding treatment outcomes. I acknowledge that a copy of this consent can be made available to me via my patient portal [facility] MyChart), or I can request a printed copy by calling the office of [facility].    I understand that my insurance will be billed for this visit.   I have read or had this consent read to me. I understand the contents of this consent, which adequately explains the benefits and risks of the Services being provided via telemedicine.  I have been provided ample opportunity to ask questions regarding this consent and the Services and have had my questions answered to my satisfaction. I give my informed consent for the services to be provided through the use of telemedicine in my medical care

## 2023-06-04 NOTE — Telephone Encounter (Signed)
 Called patient to schedule a tele visit for a pre-op clearance on 06/22/23 @ 10:20, meds rec and consent done.

## 2023-06-07 ENCOUNTER — Ambulatory Visit (HOSPITAL_COMMUNITY)

## 2023-06-09 ENCOUNTER — Other Ambulatory Visit (HOSPITAL_COMMUNITY)
Admission: RE | Admit: 2023-06-09 | Discharge: 2023-06-09 | Disposition: A | Discharge status: Home / Self Care | Source: Ambulatory Visit | Attending: Family Medicine | Admitting: Family Medicine

## 2023-06-09 ENCOUNTER — Ambulatory Visit: Payer: Self-pay

## 2023-06-09 ENCOUNTER — Encounter: Payer: Self-pay | Admitting: Family Medicine

## 2023-06-09 ENCOUNTER — Ambulatory Visit: Admitting: Family Medicine

## 2023-06-09 VITALS — BP 131/71 | HR 73 | Temp 98.4°F | Ht 65.0 in | Wt 190.0 lb

## 2023-06-09 DIAGNOSIS — R300 Dysuria: Secondary | ICD-10-CM | POA: Diagnosis not present

## 2023-06-09 DIAGNOSIS — R1032 Left lower quadrant pain: Secondary | ICD-10-CM | POA: Insufficient documentation

## 2023-06-09 DIAGNOSIS — R350 Frequency of micturition: Secondary | ICD-10-CM | POA: Diagnosis not present

## 2023-06-09 LAB — CBC WITH DIFFERENTIAL/PLATELET
Abs Immature Granulocytes: 0.09 10*3/uL — ABNORMAL HIGH (ref 0.00–0.07)
Basophils Absolute: 0 10*3/uL (ref 0.0–0.1)
Basophils Relative: 1 %
Eosinophils Absolute: 0.1 10*3/uL (ref 0.0–0.5)
Eosinophils Relative: 1 %
HCT: 35.5 % — ABNORMAL LOW (ref 36.0–46.0)
Hemoglobin: 11.4 g/dL — ABNORMAL LOW (ref 12.0–15.0)
Immature Granulocytes: 1 %
Lymphocytes Relative: 22 %
Lymphs Abs: 2 10*3/uL (ref 0.7–4.0)
MCH: 31.1 pg (ref 26.0–34.0)
MCHC: 32.1 g/dL (ref 30.0–36.0)
MCV: 96.7 fL (ref 80.0–100.0)
Monocytes Absolute: 1 10*3/uL (ref 0.1–1.0)
Monocytes Relative: 11 %
Neutro Abs: 5.7 10*3/uL (ref 1.7–7.7)
Neutrophils Relative %: 64 %
Platelets: 194 10*3/uL (ref 150–400)
RBC: 3.67 MIL/uL — ABNORMAL LOW (ref 3.87–5.11)
RDW: 13.4 % (ref 11.5–15.5)
WBC: 8.9 10*3/uL (ref 4.0–10.5)
nRBC: 0 % (ref 0.0–0.2)

## 2023-06-09 LAB — BASIC METABOLIC PANEL WITH GFR
Anion gap: 11 (ref 5–15)
BUN: 19 mg/dL (ref 8–23)
CO2: 25 mmol/L (ref 22–32)
Calcium: 9 mg/dL (ref 8.9–10.3)
Chloride: 98 mmol/L (ref 98–111)
Creatinine, Ser: 0.74 mg/dL (ref 0.44–1.00)
GFR, Estimated: >60 mL/min (ref >60)
Glucose, Bld: 108 mg/dL — ABNORMAL HIGH (ref 70–99)
Potassium: 3.7 mmol/L (ref 3.5–5.1)
Sodium: 134 mmol/L — ABNORMAL LOW (ref 135–145)

## 2023-06-09 LAB — POCT URINALYSIS DIP (CLINITEK)
Bilirubin, UA: NEGATIVE
Glucose, UA: NEGATIVE mg/dL
Ketones, POC UA: NEGATIVE mg/dL
Leukocytes, UA: NEGATIVE
Nitrite, UA: NEGATIVE
Spec Grav, UA: 1.02 (ref 1.010–1.025)
Urobilinogen, UA: 0.2 U/dL
pH, UA: 6 (ref 5.0–8.0)

## 2023-06-09 LAB — HEPATIC FUNCTION PANEL
ALT: 14 U/L (ref 0–44)
AST: 15 U/L (ref 15–41)
Albumin: 3.9 g/dL (ref 3.5–5.0)
Alkaline Phosphatase: 56 U/L (ref 38–126)
Bilirubin, Direct: 0.1 mg/dL (ref 0.0–0.2)
Indirect Bilirubin: 0.6 mg/dL (ref 0.3–0.9)
Total Bilirubin: 0.7 mg/dL (ref 0.0–1.2)
Total Protein: 6.5 g/dL (ref 6.5–8.1)

## 2023-06-09 NOTE — Telephone Encounter (Signed)
 Copied from CRM [id number]. Topic: Clinical - Red Word Triage >> Jun 09, 2023 11:54 AM Ruth [name] wrote: Red Word that prompted transfer to Nurse Triage: Onset yesterday, pressure at the bottom of her stomach, right above pubic area, urinating like crazy, several times through the night. No stinging or burning when urinating. Even after urinating the pressure is still there. Sitting down causes pain to the area from naval to pubic area.   Chief Complaint: Abdominal pain Symptoms: Lower abdominal pain, increased urinary frequency  Frequency: Constant  Pertinent Negatives: Patient denies pain with urination  Disposition: [] ED /[] Urgent Care (no appt availability in office) / [x] Appointment(In office/virtual)/ []  [facility]/ [] Home Care/ [] Refused Recommended Disposition /[] [facility]/ []  Follow-up with PCP Additional Notes: Patient reports lower abdominal pain that began 2 days ago. She states the pain is constant, and is located below her navel. She states she is also experiencing increased urinary frequency but denies any urinary pain. Appointment made for the patient today for evaluation.     Reason for Disposition  [1] MILD-MODERATE pain AND [2] constant AND [3] present > 2 hours  Answer Assessment - Initial Assessment Questions 1. LOCATION: "Where does it hurt?"      Lower mid-abdomen below navel  2. RADIATION: "Does the pain shoot anywhere else?" (e.g., chest, back)     No radiation  3. ONSET: "When did the pain begin?" (e.g., minutes, hours or days ago)      2 days ago   4. SUDDEN: "Gradual or sudden onset?"     Sudden   5. PATTERN "Does the pain come and go, or is it constant?"    - If it comes and goes: "How long does it last?" "Do you have pain now?"     (Note: Comes and goes means the pain is intermittent. It goes away completely between bouts.)    - If constant: "Is it getting better, staying the same, or getting worse?"      (Note: Constant means the pain  never goes away completely; most serious pain is constant and gets worse.)      Constant  6. SEVERITY: "How bad is the pain?"  (e.g., Scale 1-10; mild, moderate, or severe)    - MILD (1-3): Doesn't interfere with normal activities, abdomen soft and not tender to touch.     - MODERATE (4-7): Interferes with normal activities or awakens from sleep, abdomen tender to touch.     - SEVERE (8-10): Excruciating pain, doubled over, unable to do any normal activities.       7/10 7. RECURRENT SYMPTOM: "Have you ever had this type of stomach pain before?" If Yes, ask: "When was the last time?" and "What happened that time?"      No 8. CAUSE: "What do you think is causing the stomach pain?"     Unsure  9. RELIEVING/AGGRAVATING FACTORS: "What makes it better or worse?" (e.g., antacids, bending or twisting motion, bowel movement)     No 10. OTHER SYMPTOMS: "Do you have any other symptoms?" (e.g., back pain, diarrhea, fever, urination pain, vomiting)       Increased urinary frequency  Protocols used: Abdominal Pain - Female-A-AH

## 2023-06-09 NOTE — Progress Notes (Signed)
   Subjective:    [name], female    DOB: [birth year], 83 y.o.   MRN: [id number]  HPI Lower abd pain , upper pelvic pain   Frequent urination , some jarring pain with walking and initial sitting on 3rd day , no other gi symptoms  Patient increased urinary frequency over the past few days Denies true dysuria Relates lower pelvic pain over the past 24 to 48 hours worse on the left lower side Denies high fever chills or sweats Denies nausea vomiting diarrhea Scans as well as colonoscopy reviewed  Review of Systems     Objective:   Physical Exam  Lungs clear hearts regular pulse normal abdomen is soft with significant tenderness in the left lower quadrant and some tenderness in the left lower quadrant in the mid lower pelvis but no guarding or rebound Urine under the microscope overall looks good occasional WBC      Assessment & Plan:  Urine color sent Stat lab work ordered Await results Stat CT scan for Thursday ordered Warning signs and when to go to the ER was discussed in detail

## 2023-06-10 ENCOUNTER — Other Ambulatory Visit: Payer: Self-pay | Admitting: Nurse Practitioner

## 2023-06-10 ENCOUNTER — Telehealth: Payer: Self-pay | Admitting: Family Medicine

## 2023-06-10 ENCOUNTER — Ambulatory Visit (HOSPITAL_COMMUNITY)
Admission: RE | Admit: 2023-06-10 | Discharge: 2023-06-10 | Disposition: A | Discharge status: Home / Self Care | Source: Ambulatory Visit | Attending: Family Medicine | Admitting: Family Medicine

## 2023-06-10 ENCOUNTER — Encounter: Payer: Self-pay | Admitting: Nurse Practitioner

## 2023-06-10 DIAGNOSIS — K5732 Diverticulitis of large intestine without perforation or abscess without bleeding: Secondary | ICD-10-CM | POA: Diagnosis not present

## 2023-06-10 DIAGNOSIS — N2889 Other specified disorders of kidney and ureter: Secondary | ICD-10-CM | POA: Diagnosis not present

## 2023-06-10 DIAGNOSIS — R1032 Left lower quadrant pain: Secondary | ICD-10-CM | POA: Insufficient documentation

## 2023-06-10 DIAGNOSIS — K8020 Calculus of gallbladder without cholecystitis without obstruction: Secondary | ICD-10-CM | POA: Diagnosis not present

## 2023-06-10 MED ORDER — IOHEXOL 300 MG/ML  SOLN
100.0000 mL | Freq: Once | INTRAMUSCULAR | Status: AC | PRN
  Administered 2023-06-10: 100 mL via INTRAVENOUS

## 2023-06-10 MED ORDER — METRONIDAZOLE 500 MG PO TABS: ORAL_TABLET | ORAL | 0 refills | Status: DC

## 2023-06-10 MED ORDER — SULFAMETHOXAZOLE-TRIMETHOPRIM 800-160 MG PO TABS: 1.0000 | ORAL_TABLET | Freq: Two times a day (BID) | ORAL | 0 refills | Status: DC

## 2023-06-10 NOTE — Telephone Encounter (Signed)
 Phone call to patient. Reviewed CT results. Cannot take Cipro  so will look at other options for treatment of diverticulitis. Other findings discussed which can be reviewed at later appointment with Dr. Geralyn Knee.

## 2023-06-10 NOTE — Telephone Encounter (Signed)
 Please follow-up on CT scan Please notify patient If straightforward please handle Feel free to call me with any questions  I saw her yesterday with left lower quadrant pain trying to figure out if this is diverticulitis?  May need antibiotics  If CT negative Then I would recommend for patient to do TIBC along with ferritin along with stool test for blood  If CT negative also recommend gastroenterology consultation because of abdominal pain but also because her hemoglobin has dropped  If everything straightforward feel free to shoot any attacks let me know you handled it if any questions problems please call I will be away from computer until late tonight Thank you so much

## 2023-06-11 LAB — URINE CULTURE

## 2023-06-11 LAB — SPECIMEN STATUS REPORT

## 2023-06-15 ENCOUNTER — Encounter: Payer: Self-pay | Admitting: Family Medicine

## 2023-06-16 ENCOUNTER — Ambulatory Visit (HOSPITAL_COMMUNITY)

## 2023-06-16 ENCOUNTER — Other Ambulatory Visit: Payer: Self-pay | Admitting: Family Medicine

## 2023-06-16 MED ORDER — AMOXICILLIN-POT CLAVULANATE 875-125 MG PO TABS
1.0000 | ORAL_TABLET | Freq: Two times a day (BID) | ORAL | 0 refills | Status: DC
Start: 2023-06-16 — End: 2023-07-27

## 2023-06-16 MED ORDER — METRONIDAZOLE 500 MG PO TABS
ORAL_TABLET | ORAL | 0 refills | Status: DC
Start: 2023-06-16 — End: 2023-09-28

## 2023-06-17 ENCOUNTER — Other Ambulatory Visit: Payer: Self-pay | Admitting: Family Medicine

## 2023-06-17 ENCOUNTER — Telehealth: Payer: Self-pay

## 2023-06-17 NOTE — Telephone Encounter (Signed)
 Pt had questions about how to take Flagyl . Dr. Thayne Fine note in Mychart states she should take it 3x daily for 6 days but the packaging states q8 hours for 7 days. RN advised pt 3x/day and q8 hrs mean the same. Pt verbalized understanding.  Copied from CRM [id number]. Topic: Clinical - Prescription Issue >> Jun 17, 2023  2:04 PM Phil Braun wrote: Reason for CRM: Medication dosage issue  Ref:metroNIDAZOLE  (FLAGYL ) 500 MG tablet  Pt called in and stated that she is on the second round of this medication and she was told how to take this in the office but the instructions on the bottle is different. She needs to clarify how it needs to be taken. Please advise.

## 2023-06-17 NOTE — Telephone Encounter (Signed)
 Copied from CRM [id number]. Topic: Clinical - Prescription Issue >> Jun 17, 2023  2:04 PM Phil Braun wrote: Reason for CRM: ***  Ref:metroNIDAZOLE  (FLAGYL ) 500 MG tablet  Pt called in and stated that she is on the second round of this medication and she was told how to take this in the office but the instructions on the bottle is different. She needs to clarify how it needs to be taken. Please advise.

## 2023-06-22 ENCOUNTER — Ambulatory Visit

## 2023-06-22 NOTE — Progress Notes (Signed)
 Virtual Visit via Telephone Note   Because of Ruth [name] co-morbid illnesses, she is at least at moderate risk for complications without adequate follow up.  This format is felt to be most appropriate for this patient at this time.  Due to technical limitations with video connection Web designer), today's appointment will be conducted as an audio only telehealth visit, and Ruth [name] verbally agreed to proceed in this manner.   All issues noted in this document were discussed and addressed.  No physical exam could be performed with this format.  Evaluation Performed:  Preoperative cardiovascular risk assessment _____________   Date:  06/22/2023   Patient ID:  Ruth [name], DOB 01-02-1941, MRN [id number] Patient Location:  Home Provider location:   Office  Primary Care Provider:  Bennet Brasil, MD Primary Cardiologist:  Janelle Mediate, MD  Chief Complaint / Patient Profile   83 y.o. y/o female with a h/o elevated coronary artery calcium  score, palpitations, fibromyalgia, aortic atherosclerosis,  who is pending and presents today for telephonic preoperative cardiovascular risk assessment.  History of Present Illness    Ruth [name] is a 83 y.o. female who presents via audio/video conferencing for a telehealth visit today.  Pt was last seen in cardiology clinic on 10/08/2022 by Dr. Stann Earnest.  At that time Ruth [name] was doing well.  The patient is now pending procedure as outlined above. Since her last visit, she denies chest pain, shortness of breath, lower extremity edema, fatigue, palpitations, melena, hematuria, hemoptysis, diaphoresis, weakness, presyncope, syncope, orthopnea, and PND.   Past Medical History    Past Medical History:  Diagnosis Date   Anemia    Anxiety    Arthritis    Asthma    Complication of anesthesia    woke up during surgery and hard to wake up, laryngospasms   Depression    Eczema    Fatty liver    Fibromyalgia    GERD  (gastroesophageal reflux disease)    EGD/ colon 1/09   H pylori ulcer 1980-1990   s/p treatment   History of kidney stones    Hyperlipidemia    IBS (irritable bowel syndrome)    Kidney cysts    Laryngospasm    Neck pain    OSA (obstructive sleep apnea)    pt havin gsleep study  late part of 09/2022   Pneumonia 2013   PONV (postoperative nausea and vomiting)    Pre-diabetes    Psoriasis 2023   dx by Fleurette Humbles dermatology per patient   Refusal of blood transfusions as patient is Jehovah's Witness    Past Surgical History:  Procedure Laterality Date   BILATERAL SALPINGOOPHORECTOMY  1990s   abdominal   BIOPSY  09/11/2020   Procedure: BIOPSY;  Surgeon: Ruby Corporal, MD;  Location: [facility];  Service: Endoscopy;;  gastric   BIOPSY  11/11/2022   Procedure: BIOPSY;  Surgeon: Urban Garden, MD;  Location: [facility];  Service: Gastroenterology;;   BIOPSY  01/18/2023   Procedure: BIOPSY;  Surgeon: Normie Becton., MD;  Location: [facility];  Service: Gastroenterology;;   CARDIAC CATHETERIZATION  2002   [facility]) normal coronary arteries    COLONOSCOPY  01/2007   Dr. Kathryne Parisian   COLONOSCOPY N/A 04/18/2015   Procedure: COLONOSCOPY;  Surgeon: Ruby Corporal, MD;  Location: [facility];  Service: Endoscopy;  Laterality: N/A;  830 - moved to 8:55 - Ann to notify pt   COLONOSCOPY WITH ESOPHAGOGASTRODUODENOSCOPY (EGD)  02/16/2012   Procedure: COLONOSCOPY WITH ESOPHAGOGASTRODUODENOSCOPY (EGD);  Surgeon: Suzette Espy, MD;  Location: [facility];  Service: Endoscopy;  Laterality: N/A;  8:45   COLONOSCOPY WITH PROPOFOL  N/A 09/11/2020   Procedure: COLONOSCOPY WITH PROPOFOL ;  Surgeon: Ruby Corporal, MD;  [facility]: [facility];  Service: Endoscopy;  Laterality: N/A;  10:55   ESOPHAGEAL DILATION N/A 06/07/2014   Procedure: ESOPHAGEAL DILATION;  Surgeon: Ruby Corporal, MD;  Location: [facility];  Service: Endoscopy;  Laterality: N/A;   ESOPHAGEAL  DILATION N/A 09/11/2020   Procedure: ESOPHAGEAL DILATION;  Surgeon: Ruby Corporal, MD;  [facility]: [facility];  Service: Endoscopy;  Laterality: N/A;   ESOPHAGOGASTRODUODENOSCOPY  01/2007   Dr Adan Holms- 3 cm hiatal hernia, benign esophageal biopsies, erosive esophagitis, gastritis   ESOPHAGOGASTRODUODENOSCOPY N/A 06/07/2014   Procedure: ESOPHAGOGASTRODUODENOSCOPY (EGD);  Surgeon: Ruby Corporal, MD;  [facility]: [facility];  Service: Endoscopy;  Laterality: N/A;  1200   ESOPHAGOGASTRODUODENOSCOPY N/A 08/18/2017   Procedure: ESOPHAGOGASTRODUODENOSCOPY (EGD);  Surgeon: Ruby Corporal, MD;  [facility]: [facility];  Service: Endoscopy;  Laterality: N/A;  2:20   ESOPHAGOGASTRODUODENOSCOPY (EGD) WITH PROPOFOL  N/A 09/11/2020   Procedure: ESOPHAGOGASTRODUODENOSCOPY (EGD) WITH PROPOFOL ;  Surgeon: Ruby Corporal, MD;  [facility]: [facility];  Service: Endoscopy;  Laterality: N/A;   ESOPHAGOGASTRODUODENOSCOPY (EGD) WITH PROPOFOL  N/A 11/11/2022   Procedure: ESOPHAGOGASTRODUODENOSCOPY (EGD) WITH PROPOFOL ;  Surgeon: Urban Garden, MD;  [facility]: [facility];  Service: Gastroenterology;  Laterality: N/A;  7:30am;asa 3   ESOPHAGOGASTRODUODENOSCOPY (EGD) WITH PROPOFOL  N/A 01/18/2023   Procedure: ESOPHAGOGASTRODUODENOSCOPY (EGD) WITH PROPOFOL ;  Surgeon: Brice Campi Albino Alu., MD;  Location: [facility];  Service: Gastroenterology;  Laterality: N/A;   EUS N/A 01/18/2023   Procedure: UPPER ENDOSCOPIC ULTRASOUND (EUS) RADIAL;  Surgeon: Normie Becton., MD;  Location: [facility];  Service: Gastroenterology;  Laterality: N/A;   FOOT SURGERY Left 2005   hammer toe   INNER EAR SURGERY     Left   KNEE SURGERY Left    x 2   POLYPECTOMY  09/11/2020   Procedure: POLYPECTOMY;  Surgeon: Ruby Corporal, MD;  Location: [facility];  Service: Endoscopy;;   SHOULDER ARTHROSCOPY WITH ROTATOR CUFF REPAIR Right 05/07/2021   Procedure: SHOULDER ARTHROSCOPY WITH MANIPULATION UNDER  ANESTHESIA ,SUBACROMIAL DECOMPRESSION, DISTAL CLAVICLE RESECTION, ROTATOR CUFF REPAIR , BICEPS TENOTOMY;  Surgeon: Genevie Kerns, MD;  Location: [facility];  Service: Orthopedics;  Laterality: Right;  WITH INTERSCALENE BLOCK NEEDS PRE OP CONSULT  WITH ANESTHESIA 120   SHOULDER SURGERY Left 2011   TONSILLECTOMY     age 64   TOTAL KNEE ARTHROPLASTY Left 02/07/2018   Procedure: TOTAL KNEE ARTHROPLASTY;  Surgeon: Liliane Rei, MD;  Location: [facility];  Service: Orthopedics;  Laterality: Left;    TUBAL LIGATION  1970   with an appy   TYMPANOSTOMY TUBE PLACEMENT  1984   VAGINAL HYSTERECTOMY  1970    Allergies  Allergies  Allergen Reactions   Blood-Group Specific Substance Other (See Comments)    NO BLOOD PRODUCTS-refuses transfusions   Ciprofloxacin  Other (See Comments)    Extreme fatigue   Codeine Nausea And Vomiting   Latex Other (See Comments)    Red and raw area around incision   Sertraline Hcl Other (See Comments)    Made patient a "zombie"   Vicodin [Hydrocodone -Acetaminophen ] Nausea And Vomiting   Tramadol  Swelling, Palpitations and Hypertension    Increased heart rate,increased blood pressure, edema    Home Medications  Prior to Admission medications   Medication Sig Start Date End Date Taking? Authorizing Provider  acetaminophen  (TYLENOL ) 500 MG tablet Take 1,000 mg by mouth every 6 (six) hours as needed for moderate pain.    [provider]  acidophilus (RISAQUAD) CAPS capsule Take 1 capsule by mouth 4 (four) times a week.    [provider]  albuterol  (VENTOLIN  HFA) 108 (90 Base) MCG/ACT inhaler Inhale 2 puffs into the lungs every 6 (six) hours as needed for wheezing. 02/03/22   Bennet Brasil, MD  amoxicillin -clavulanate (AUGMENTIN ) 875-125 MG tablet Take 1 tablet by mouth 2 (two) times daily. 06/16/23   Bennet Brasil, MD  Ascorbic Acid (VITAMIN C PO) Take 1 tablet by mouth 4 (four) times a week.    [provider]  b complex vitamins  capsule Take 1 capsule by mouth 4 (four) times a week.    [provider]  cholecalciferol (VITAMIN D3) 25 MCG (1000 UNIT) tablet Take 1,000 Units by mouth daily.    [provider]  dicyclomine  (BENTYL ) 10 MG capsule Take 1 capsule (10 mg total) by mouth 3 (three) times daily as needed. For stomach cramps 11/24/21   Bennet Brasil, MD  famotidine  (PEPCID ) 40 MG tablet Take 1 tablet (40 mg total) by mouth daily. 04/01/23   Carlan, Fidel Huddle, NP  FLUoxetine  (PROZAC ) 10 MG capsule Take 2 capsules by mouth twice daily 06/17/23   Luking, Jackelyn Marvel, MD  FLUoxetine  (PROZAC ) 20 MG capsule TAKE 1 CAPSULES BY MOUTH TWICE DAILY 40 mg total 01/13/23   Luking, Scott A, MD  fluticasone  (CUTIVATE ) 0.05 % cream Apply 1 application  topically daily as needed (irritation).    [provider]  fluticasone  (FLONASE ) 50 MCG/ACT nasal spray Place 2 sprays into both nostrils daily. 03/19/23   Grooms, Courtney, PA-C  ketoconazole  (NIZORAL ) 2 % cream Apply 1 application topically daily. Patient taking differently: Apply 1 application  topically daily as needed for irritation. 02/21/20   Mickiel Albany, FNP  lidocaine -prilocaine  (EMLA ) cream Apply 1 Application topically as needed. 03/13/22   Arma Lamp, MD  metroNIDAZOLE  (FLAGYL ) 500 MG tablet TAKE 1 TABLET BY MOUTH EVERY 8 HOURS FOR 7 DAYS 06/16/23   Bennet Brasil, MD  nitroGLYCERIN  (NITROSTAT ) 0.4 MG SL tablet as needed. 04/14/16   [provider]  ondansetron  (ZOFRAN ) 4 MG tablet Take 1 tablet (4 mg total) by mouth every 8 (eight) hours as needed for nausea or vomiting. 12/16/21   Bennet Brasil, MD  oxyCODONE  (ROXICODONE ) 5 MG immediate release tablet 1/2 to 1 q 6 hours prn pain use sparingly 05/12/23   Bennet Brasil, MD  Polyvinyl Alcohol (LUBRICANT DROPS OP) Place 2 drops into both eyes 3 (three) times daily as needed (for dry eyes).    [provider]  rosuvastatin  (CRESTOR ) 5 MG tablet Take 1 tablet (5 mg total) by  mouth daily. 05/24/23   Bennet Brasil, MD  Zinc Sulfate (ZINC 15 PO) Take by mouth.    [provider]    Physical Exam    Vital Signs:  Ruth [name] does not have vital signs available for review today.  Given telephonic nature of communication, physical exam is limited. AAOx3. NAD. Normal affect.  Speech and respirations are unlabored.  Accessory Clinical Findings    None  Assessment & Plan    1.  Preoperative Cardiovascular Risk Assessment:  The patient was advised that if she develops new symptoms prior to  surgery to contact our office to arrange for a follow-up visit, and she verbalized understanding.  No request to hold cardiac medications.  A copy of this note will be routed to requesting surgeon.  Time:   Today, I have spent  minutes with the patient with telehealth technology discussing medical history, symptoms, and management plan.     Gerldine Koch, NP  06/22/2023, 8:50 AMThis encounter was created in error - please disregard.

## 2023-06-28 ENCOUNTER — Telehealth: Payer: Self-pay | Admitting: Family Medicine

## 2023-07-02 ENCOUNTER — Other Ambulatory Visit: Payer: Self-pay | Admitting: Nurse Practitioner

## 2023-07-02 MED ORDER — OXYCODONE HCL 5 MG PO TABS
ORAL_TABLET | ORAL | 0 refills | Status: DC
Start: 2023-07-02 — End: 2023-08-06

## 2023-07-16 NOTE — Progress Notes (Signed)
 Office Visit Note  Patient: Ruth [name]             Date of Birth: December 21, 1940           MRN: [id number]             PCP: Bennet Brasil, MD Referring: Bennet Brasil, MD Visit Date: 07/30/2023 Occupation: @GUAROCC @  Subjective:  Pain in both hands   History of Present Illness: Ruth [name] is a 83 y.o. female osteoarthritis and fibromyalgia syndrome.  She returns today after her last visit in December 2024.  She states recently she has been having pain and discomfort in her both hands especially over the DIP joints.  She continues to have discomfort in her shoulder joints and is awaiting shoulder joint replacement by Dr. Alfredo Ano.  She states with her busy schedule she did not get a chance to have shoulder replacement surgery schedule.  He continues to have discomfort in her right knee joint which also requires replacement.  Left total knee replacement is doing well.  She continues to have discomfort in her feet.  She continues to have some discomfort from fibromyalgia.  She states with the weather changes her fibromyalgia symptoms are better.    Activities of Daily Living:  Patient reports morning stiffness for 15-30 minutes.   Patient Reports nocturnal pain.  Difficulty dressing/grooming: Reports Difficulty climbing stairs: Reports Difficulty getting out of chair: Reports Difficulty using hands for taps, buttons, cutlery, and/or writing: Reports  Review of Systems  Constitutional:  Positive for fatigue.  HENT:  Negative for mouth sores and mouth dryness.   Eyes:  Positive for dryness.  Respiratory:  Negative for shortness of breath.   Cardiovascular:  Positive for palpitations. Negative for chest pain.  Gastrointestinal:  Positive for constipation. Negative for blood in stool and diarrhea.  Endocrine: Positive for increased urination.  Genitourinary:  Negative for involuntary urination.  Musculoskeletal:  Positive for joint pain, gait problem, joint pain, myalgias,  morning stiffness, muscle tenderness and myalgias. Negative for joint swelling and muscle weakness.  Skin:  Positive for color change, rash and hair loss. Negative for sensitivity to sunlight.  Allergic/Immunologic: Negative for susceptible to infections.  Neurological:  Positive for light-headedness and headaches. Negative for dizziness.  Hematological:  Positive for swollen glands.  Psychiatric/Behavioral:  Positive for depressed mood and sleep disturbance. The patient is nervous/anxious.     PMFS History:  Patient Active Problem List   Diagnosis Date Noted   Gastric nodule 01/18/2023   Nausea without vomiting 07/27/2022   Pain in joint of right shoulder 07/09/2020   Body mass index (BMI) 35.0-35.9, adult 06/06/2019   Cervical spondylosis with radiculopathy 06/06/2019   MDD (major depressive disorder), recurrent, in partial remission (HCC) 03/27/2019   Depression, major, single episode, mild (HCC) 03/07/2018   History of total knee replacement, left 02/21/2018   OA (osteoarthritis) of knee 02/07/2018   Non-allergic rhinitis 12/16/2017   Allergy  to metal 12/16/2017   Pain in right foot 09/21/2017   Primary osteoarthritis of both feet 04/29/2017   Osteoarthritis of foot joint 04/29/2017   Hammer toe 04/23/2017   Family history of premature coronary heart disease 04/15/2016   Primary osteoarthritis of both knees 12/31/2015   Vulvodynia 08/03/2014   Anxiety disorder 07/06/2014   Fibromyalgia 04/25/2014   Prediabetes 03/18/2014   Hyperlipidemia 03/18/2014   Osteopenia 03/06/2014   History of cardiac catheterization 04/28/2013   Abdominal pain, epigastric 02/02/2012   GERD (gastroesophageal reflux disease) 02/02/2012  Indigestion 02/02/2012   Esophageal dysphagia 02/05/2011   CHEST PAIN 02/11/2009   IRRITABLE BOWEL SYNDROME 02/15/2007   Primary osteoarthritis of both hands 02/15/2007   Degenerative joint disease of hand 02/15/2007   GASTRITIS 02/14/2007    Past Medical  History:  Diagnosis Date   Abnormal RBC 04/2023   Anemia    Anxiety    Arthritis    Asthma    Complication of anesthesia    woke up during surgery and hard to wake up, laryngospasms   Depression    Diverticulitis 05/2023   Eczema    Fatty liver    Fibromyalgia    GERD (gastroesophageal reflux disease)    EGD/ colon 1/09   H pylori ulcer 1980-1990   s/p treatment   History of kidney stones    Hyperlipidemia    IBS (irritable bowel syndrome)    Kidney cysts    Laryngospasm    Neck pain    OSA (obstructive sleep apnea)    pt havin gsleep study  late part of 09/2022   Pneumonia 2013   PONV (postoperative nausea and vomiting)    Pre-diabetes    Psoriasis 2023   dx by Fleurette H[facility] dermatology per patient   Refusal of blood transfusions as patient is Jehovah's Witness     Family History  Problem Relation Age of Onset   Paranoid behavior Mother        depression   Colon polyps Mother    Eczema Mother    Depression Mother    Diabetes Father    Heart attack Father        72's   Lung cancer Father    Cirrhosis Father 69       etoh cirrhosis   Eczema Father    Lung cancer Sister    Depression Sister    Alzheimer's disease Sister    Hypertension Sister    Diabetes Sister    Lung cancer Sister    Sleep apnea Sister    Depression Maternal Aunt    COPD Daughter    Drug abuse Niece    Suicidality Niece    Allergic rhinitis Neg Hx    Asthma Neg Hx    Urticaria Neg Hx    Past Surgical History:  Procedure Laterality Date   BILATERAL SALPINGOOPHORECTOMY  1990s   abdominal   BIOPSY  09/11/2020   Procedure: BIOPSY;  Surgeon: Ruby Corporal, MD;  Location: [facility];  Service: Endoscopy;;  gastric   BIOPSY  11/11/2022   Procedure: BIOPSY;  Surgeon: Urban Garden, MD;  Location: [facility];  Service: Gastroenterology;;   BIOPSY  01/18/2023   Procedure: BIOPSY;  Surgeon: Normie Becton., MD;  Location: [facility];  Service: Gastroenterology;;    CARDIAC CATHETERIZATION  2002   [facility]) normal coronary arteries    COLONOSCOPY  01/2007   Dr. Kathryne Parisian   COLONOSCOPY N/A 04/18/2015   Procedure: COLONOSCOPY;  Surgeon: Ruby Corporal, MD;  [facility]: [facility];  Service: Endoscopy;  Laterality: N/A;  830 - moved to 8:55 - Ann to notify pt   COLONOSCOPY WITH ESOPHAGOGASTRODUODENOSCOPY (EGD)  02/16/2012   Procedure: COLONOSCOPY WITH ESOPHAGOGASTRODUODENOSCOPY (EGD);  Surgeon: Suzette Espy, MD;  [facility]: [facility];  Service: Endoscopy;  Laterality: N/A;  8:45   COLONOSCOPY WITH PROPOFOL  N/A 09/11/2020   Procedure: COLONOSCOPY WITH PROPOFOL ;  Surgeon: Ruby Corporal, MD;  [facility]: [facility];  Service: Endoscopy;  Laterality: N/A;  10:55   ESOPHAGEAL DILATION  N/A 06/07/2014   Procedure: ESOPHAGEAL DILATION;  Surgeon: Ruby Corporal, MD;  [facility]: [facility];  Service: Endoscopy;  Laterality: N/A;   ESOPHAGEAL DILATION N/A 09/11/2020   Procedure: ESOPHAGEAL DILATION;  Surgeon: Ruby Corporal, MD;  [facility];  Service: Endoscopy;  Laterality: N/A;   ESOPHAGOGASTRODUODENOSCOPY  01/2007   Dr Adan Holms- 3 cm hiatal hernia, benign esophageal biopsies, erosive esophagitis, gastritis   ESOPHAGOGASTRODUODENOSCOPY N/A 06/07/2014   Procedure: ESOPHAGOGASTRODUODENOSCOPY (EGD);  Surgeon: Ruby Corporal, MD;  [facility];  Service: Endoscopy;  Laterality: N/A;  1200   ESOPHAGOGASTRODUODENOSCOPY N/A 08/18/2017   Procedure: ESOPHAGOGASTRODUODENOSCOPY (EGD);  Surgeon: Ruby Corporal, MD;  [facility];  Service: Endoscopy;  Laterality: N/A;  2:20   ESOPHAGOGASTRODUODENOSCOPY (EGD) WITH PROPOFOL  N/A 09/11/2020   Procedure: ESOPHAGOGASTRODUODENOSCOPY (EGD) WITH PROPOFOL ;  Surgeon: Ruby Corporal, MD;  [facility];  Service: Endoscopy;  Laterality: N/A;   ESOPHAGOGASTRODUODENOSCOPY (EGD) WITH PROPOFOL  N/A 11/11/2022   Procedure: ESOPHAGOGASTRODUODENOSCOPY (EGD) WITH PROPOFOL ;   Surgeon: Urban Garden, MD;  [facility];  Service: Gastroenterology;  Laterality: N/A;  7:30am;asa 3   ESOPHAGOGASTRODUODENOSCOPY (EGD) WITH PROPOFOL  N/A 01/18/2023   Procedure: ESOPHAGOGASTRODUODENOSCOPY (EGD) WITH PROPOFOL ;  Surgeon: Brice Campi Albino Alu., MD;  Location: [facility];  Service: Gastroenterology;  Laterality: N/A;   EUS N/A 01/18/2023   Procedure: UPPER ENDOSCOPIC ULTRASOUND (EUS) RADIAL;  Surgeon: Normie Becton., MD;  Location: [facility];  Service: Gastroenterology;  Laterality: N/A;   FOOT SURGERY Left 2005   hammer toe   INNER EAR SURGERY     Left   KNEE SURGERY Left    x 2   POLYPECTOMY  09/11/2020   Procedure: POLYPECTOMY;  Surgeon: Ruby Corporal, MD;  Location: [facility];  Service: Endoscopy;;   SHOULDER ARTHROSCOPY WITH ROTATOR CUFF REPAIR Right 05/07/2021   Procedure: SHOULDER ARTHROSCOPY WITH MANIPULATION UNDER ANESTHESIA ,SUBACROMIAL DECOMPRESSION, DISTAL CLAVICLE RESECTION, ROTATOR CUFF REPAIR , BICEPS TENOTOMY;  Surgeon: Genevie Kerns, MD;  Location: [facility];  Service: Orthopedics;  Laterality: Right;  WITH INTERSCALENE BLOCK NEEDS PRE OP CONSULT  WITH ANESTHESIA 120   SHOULDER SURGERY Left 2011   TONSILLECTOMY     age 40   TOTAL KNEE ARTHROPLASTY Left 02/07/2018   Procedure: TOTAL KNEE ARTHROPLASTY;  Surgeon: Liliane Rei, MD;  Location: [facility];  Service: Orthopedics;  Laterality: Left;    TUBAL LIGATION  1970   with an appy   TYMPANOSTOMY TUBE PLACEMENT  1984   VAGINAL HYSTERECTOMY  1970   Social History   Social History Narrative   Married 2 grown children, 1 adopted son-autistic-lives next door.   1 granddaughter   2 step grandsons   Immunization History  Administered Date(s) Administered   Influenza,inj,Quad PF,6+ Mos 12/15/2017, 11/15/2018   Influenza,inj,quad, With Preservative 11/16/2016   Influenza-Unspecified 11/22/2015, 11/01/2019, 11/30/2020, 11/26/2021, 11/17/2022   Moderna  Sars-Covid-2 Vaccination 03/24/2019, 04/22/2019, 11/18/2019, 06/02/2020   Pneumococcal Conjugate-13 02/15/2014   Pneumococcal Polysaccharide-23 01/25/2020   Zoster Recombinant(Shingrix) 07/10/2016     Objective: Vital Signs: BP 105/63 (BP Location: Left Arm, Patient Position: Sitting, Cuff Size: Normal)   Pulse 69   Resp 16   Ht 5' 5 (1.651 m)   Wt 192 lb (87.1 kg)   BMI 31.95 kg/m    Physical Exam Vitals and nursing note reviewed.  Constitutional:      Appearance: She is well-developed.  HENT:     Head: Normocephalic and atraumatic.   Eyes:  Conjunctiva/sclera: Conjunctivae normal.    Cardiovascular:     Rate and Rhythm: Normal rate and regular rhythm.     Heart sounds: Normal heart sounds.  Pulmonary:     Effort: Pulmonary effort is normal.     Breath sounds: Normal breath sounds.  Abdominal:     General: Bowel sounds are normal.     Palpations: Abdomen is soft.   Musculoskeletal:     Cervical back: Normal range of motion.  Lymphadenopathy:     Cervical: No cervical adenopathy.   Skin:    General: Skin is warm and dry.     Capillary Refill: Capillary refill takes less than 2 seconds.   Neurological:     Mental Status: She is alert and oriented to person, place, and time.   Psychiatric:        Behavior: Behavior normal.      Musculoskeletal Exam: She had limited lateral rotation of the cervical spine.  She had some discomfort range of motion of her lumbar spine.  Right shoulder and abduction was limited to about 70 degrees but limited internal rotation.  Left shoulder joint abduction was about 100 degrees with limited internal rotation.  Elbows and wrist joints with good range of motion.  Bilateral CMC PIP and DIP thickening was noted.  Hip joints in good range of motion.  She had limited extension of her right knee joint without any warmth swelling or effusion.  Left knee joint was replaced.  There was no tenderness over ankles.  She had some discomfort in  her MTP joints without any synovitis.  CDAI Exam: CDAI Score: -- Patient Global: --; Provider Global: -- Swollen: --; Tender: -- Joint Exam 07/30/2023   No joint exam has been documented for this visit   There is currently no information documented on the homunculus. Go to the Rheumatology activity and complete the homunculus joint exam.  Investigation: No additional findings.  Imaging: No results found.  Recent Labs: Lab Results  Component Value Date   WBC 8.9 06/09/2023   HGB 11.4 (L) 06/09/2023   PLT 194 06/09/2023   NA 134 (L) 06/09/2023   K 3.7 06/09/2023   CL 98 06/09/2023   CO2 25 06/09/2023   GLUCOSE 108 (H) 06/09/2023   BUN 19 06/09/2023   CREATININE 0.74 06/09/2023   BILITOT 0.7 06/09/2023   ALKPHOS 56 06/09/2023   AST 15 06/09/2023   ALT 14 06/09/2023   PROT 6.5 06/09/2023   ALBUMIN 3.9 06/09/2023   CALCIUM  9.0 06/09/2023   GFRAA 84 07/07/2018    Speciality Comments: No specialty comments available.  Procedures:  No procedures performed Allergies: Blood-group specific substance, Ciprofloxacin , Codeine, Latex, Sertraline hcl, Vicodin [hydrocodone -acetaminophen ], and Tramadol    Assessment / Plan:     Visit Diagnoses: Primary osteoarthritis of both hands-she has been experiencing increased pain and discomfort in the bilateral hands.  Bilateral CMC, PIP and DIP thickening was noted.  Atrial counts regarding osteoarthritis was provided.  Joint protection muscle strengthening was discussed.  A handout on hand exercises was given.  I also offered referral to physical therapy/Occupational Therapy but she would like to hold off for now.  She states she is very much involved in her husband's care and does not have much personal time.  Primary osteoarthritis of right shoulder - She had right rotator cuff tear surgery in March 2023.  She had limited range of motion of bilateral shoulders with discomfort.  She will require total shoulder replacement in the future per  patient.  Primary osteoarthritis of right knee-she could history of pain and discomfort in her right knee joint.  No warmth swelling or effusion was noted.  She was advised total knee replacement in the future.  Status post total knee replacement, left - She had total knee replacement in December 2019 by Dr. Rossie Coon.  She had good range of motion of the left knee joint.  Primary osteoarthritis of both feet-she continues to have chronic pain and discomfort in her feet.  She states she has had surgery on her left foot.  She plans to see a podiatrist or an orthopedic surgeon for a follow-up visit.  Pes cavus of both feet-using arch support with metatarsal pads was discussed.  Hammertoes of both feet  Rash - Patient states she has seen a dermatologist in the past who gave her the diagnosis of psoriasis.  She does not recall if she had a skin biopsy.  She had few dry scales on her left finger and right forearm and on her pinna.  The clinical findings were suggestive of atopic dermatitis.  Patient states that her appointment with the dermatologist pending.  Fibromyalgia-she continues to have some generalized pain and discomfort from fibromyalgia.  Other fatigue-related to fibromyalgia syndrome.  Primary insomnia-sleep hygiene was discussed.  Osteopenia of multiple sites - managed by her PCP.  Coarse tremors  Orders: No orders of the defined types were placed in this encounter.  No orders of the defined types were placed in this encounter.   Follow-Up Instructions: Return in about 6 months (around 01/29/2024) for Osteoarthritis.   Nicholas Bari, MD  Note - This record has been created using Animal nutritionist.  Chart creation errors have been sought, but may not always  have been located. Such creation errors do not reflect on  the standard of medical care.

## 2023-07-22 ENCOUNTER — Telehealth: Payer: Self-pay | Admitting: Family Medicine

## 2023-07-22 NOTE — Telephone Encounter (Signed)
 Nurses Patient has office visit June 25 I also received a request from her orthopedic group regarding surgical release Generally I like to see a person in person office visit to do this.  We can do this on June 25 when she comes if that is fine with her Please touch base see if she feels she needs to seen sooner for this issue if so we may have to work her in either next week or the week after with me in a open slot-otherwise keep regular visit June 25 thank you thank you

## 2023-07-22 NOTE — Telephone Encounter (Signed)
 Unable to reach pt , will attempt call later to inform per provider notes

## 2023-07-23 NOTE — Telephone Encounter (Signed)
 Was able to speak with patient and she stated that the currently scheduled appt on 08/11/23 is currently fine, but will double check and see if she needs to move the appt up sooner. She also asked if she needed any labs , please advise

## 2023-07-27 ENCOUNTER — Other Ambulatory Visit: Payer: Self-pay | Admitting: Family Medicine

## 2023-07-27 NOTE — Telephone Encounter (Signed)
 Metabolic 7, CBC, TIBC, ferritin Iron deficient anemia, history of hyperkalemia, HTN

## 2023-07-28 ENCOUNTER — Other Ambulatory Visit: Payer: Self-pay

## 2023-07-28 DIAGNOSIS — D509 Iron deficiency anemia, unspecified: Secondary | ICD-10-CM

## 2023-07-28 DIAGNOSIS — E875 Hyperkalemia: Secondary | ICD-10-CM

## 2023-07-28 NOTE — Telephone Encounter (Signed)
 Left message for patient to return call to inform labs are ordered in the system

## 2023-07-30 ENCOUNTER — Ambulatory Visit: Payer: No Typology Code available for payment source | Attending: Rheumatology | Admitting: Rheumatology

## 2023-07-30 ENCOUNTER — Encounter: Payer: Self-pay | Admitting: Rheumatology

## 2023-07-30 VITALS — BP 105/63 | HR 69 | Resp 16 | Ht 65.0 in | Wt 192.0 lb

## 2023-07-30 DIAGNOSIS — M19041 Primary osteoarthritis, right hand: Secondary | ICD-10-CM

## 2023-07-30 DIAGNOSIS — M19011 Primary osteoarthritis, right shoulder: Secondary | ICD-10-CM | POA: Diagnosis not present

## 2023-07-30 DIAGNOSIS — Q6671 Congenital pes cavus, right foot: Secondary | ICD-10-CM | POA: Diagnosis not present

## 2023-07-30 DIAGNOSIS — R5383 Other fatigue: Secondary | ICD-10-CM

## 2023-07-30 DIAGNOSIS — Q6672 Congenital pes cavus, left foot: Secondary | ICD-10-CM

## 2023-07-30 DIAGNOSIS — M8589 Other specified disorders of bone density and structure, multiple sites: Secondary | ICD-10-CM

## 2023-07-30 DIAGNOSIS — Z96652 Presence of left artificial knee joint: Secondary | ICD-10-CM | POA: Diagnosis not present

## 2023-07-30 DIAGNOSIS — M19042 Primary osteoarthritis, left hand: Secondary | ICD-10-CM

## 2023-07-30 DIAGNOSIS — M797 Fibromyalgia: Secondary | ICD-10-CM | POA: Diagnosis not present

## 2023-07-30 DIAGNOSIS — G252 Other specified forms of tremor: Secondary | ICD-10-CM

## 2023-07-30 DIAGNOSIS — M19071 Primary osteoarthritis, right ankle and foot: Secondary | ICD-10-CM | POA: Diagnosis not present

## 2023-07-30 DIAGNOSIS — R21 Rash and other nonspecific skin eruption: Secondary | ICD-10-CM

## 2023-07-30 DIAGNOSIS — M1711 Unilateral primary osteoarthritis, right knee: Secondary | ICD-10-CM | POA: Diagnosis not present

## 2023-07-30 DIAGNOSIS — M19072 Primary osteoarthritis, left ankle and foot: Secondary | ICD-10-CM

## 2023-07-30 DIAGNOSIS — M2041 Other hammer toe(s) (acquired), right foot: Secondary | ICD-10-CM | POA: Diagnosis not present

## 2023-07-30 DIAGNOSIS — F5101 Primary insomnia: Secondary | ICD-10-CM

## 2023-07-30 DIAGNOSIS — M2042 Other hammer toe(s) (acquired), left foot: Secondary | ICD-10-CM

## 2023-07-30 NOTE — Patient Instructions (Signed)
 Hand Exercises Hand exercises can be helpful for almost anyone. They can strengthen your hands and improve flexibility and movement. The exercises can also increase blood flow to the hands. These results can make your work and daily tasks easier for you. Hand exercises can be especially helpful for people who have joint pain from arthritis or nerve damage from using their hands over and over. These exercises can also help people who injure a hand. Exercises Most of these hand exercises are gentle stretching and motion exercises. It is usually safe to do them often throughout the day. Warming up your hands before exercise may help reduce stiffness. You can do this with gentle massage or by placing your hands in warm water  for 10-15 minutes. It is normal to feel some stretching, pulling, tightness, or mild discomfort when you begin new exercises. In time, this will improve. Remember to always be careful and stop right away if you feel sudden, very bad pain or your pain gets worse. You want to get better and be safe. Ask your health care provider which exercises are safe for you. Do exercises exactly as told by your provider and adjust them as told. Do not begin these exercises until told by your provider. Knuckle bend or claw fist  Stand or sit with your arm, hand, and all five fingers pointed straight up. Make sure to keep your wrist straight. Gently bend your fingers down toward your palm until the tips of your fingers are touching your palm. Keep your big knuckle straight and only bend the small knuckles in your fingers. Hold this position for 10 seconds. Straighten your fingers back to your starting position. Repeat this exercise 5-10 times with each hand. Full finger fist  Stand or sit with your arm, hand, and all five fingers pointed straight up. Make sure to keep your wrist straight. Gently bend your fingers into your palm until the tips of your fingers are touching the middle of your  palm. Hold this position for 10 seconds. Extend your fingers back to your starting position, stretching every joint fully. Repeat this exercise 5-10 times with each hand. Straight fist  Stand or sit with your arm, hand, and all five fingers pointed straight up. Make sure to keep your wrist straight. Gently bend your fingers at the big knuckle, where your fingers meet your hand, and at the middle knuckle. Keep the knuckle at the tips of your fingers straight and try to touch the bottom of your palm. Hold this position for 10 seconds. Extend your fingers back to your starting position, stretching every joint fully. Repeat this exercise 5-10 times with each hand. Tabletop  Stand or sit with your arm, hand, and all five fingers pointed straight up. Make sure to keep your wrist straight. Gently bend your fingers at the big knuckle, where your fingers meet your hand, as far down as you can. Keep the small knuckles in your fingers straight. Think of forming a tabletop with your fingers. Hold this position for 10 seconds. Extend your fingers back to your starting position, stretching every joint fully. Repeat this exercise 5-10 times with each hand. Finger spread  Place your hand flat on a table with your palm facing down. Make sure your wrist stays straight. Spread your fingers and thumb apart from each other as far as you can until you feel a gentle stretch. Hold this position for 10 seconds. Bring your fingers and thumb tight together again. Hold this position for 10 seconds. Repeat  this exercise 5-10 times with each hand. Making circles  Stand or sit with your arm, hand, and all five fingers pointed straight up. Make sure to keep your wrist straight. Make a circle by touching the tip of your thumb to the tip of your index finger. Hold for 10 seconds. Then open your hand wide. Repeat this motion with your thumb and each of your fingers. Repeat this exercise 5-10 times with each hand. Thumb  motion  Sit with your forearm resting on a table and your wrist straight. Your thumb should be facing up toward the ceiling. Keep your fingers relaxed as you move your thumb. Lift your thumb up as high as you can toward the ceiling. Hold for 10 seconds. Bend your thumb across your palm as far as you can, reaching the tip of your thumb for the small finger (pinkie) side of your palm. Hold for 10 seconds. Repeat this exercise 5-10 times with each hand. Grip strengthening  Hold a stress ball or other soft ball in the middle of your hand. Slowly increase the pressure, squeezing the ball as much as you can without causing pain. Think of bringing the tips of your fingers into the middle of your palm. All of your finger joints should bend when doing this exercise. Hold your squeeze for 10 seconds, then relax. Repeat this exercise 5-10 times with each hand. Contact a health care provider if: Your hand pain or discomfort gets much worse when you do an exercise. Your hand pain or discomfort does not improve within 2 hours after you exercise. If you have either of these problems, stop doing these exercises right away. Do not do them again unless your provider says that you can. Get help right away if: You develop sudden, severe hand pain or swelling. If this happens, stop doing these exercises right away. Do not do them again unless your provider says that you can. This information is not intended to replace advice given to you by your health care provider. Make sure you discuss any questions you have with your health care provider. Document Revised: 02/17/2022 Document Reviewed: 02/17/2022 Elsevier Patient Education  2024 [employer] Shoulder Exercises Ask your health care provider which exercises are safe for you. Do exercises exactly as told by your health care provider and adjust them as directed. It is normal to feel mild stretching, pulling, tightness, or discomfort as you do these exercises.  Stop right away if you feel sudden pain or your pain gets worse. Do not begin these exercises until told by your health care provider. Stretching exercises External rotation and abduction This exercise is sometimes called corner stretch. The exercise rotates your arm outward (external rotation) and moves your arm out from your body (abduction). Stand in a doorway with one of your feet slightly in front of the other. This is called a staggered stance. If you cannot reach your forearms to the door frame, stand facing a corner of a room. Choose one of the following positions as told by your health care provider: Place your hands and forearms on the door frame above your head. Place your hands and forearms on the door frame at the height of your head. Place your hands on the door frame at the height of your elbows. Slowly move your weight onto your front foot until you feel a stretch across your chest and in the front of your shoulders. Keep your head and chest upright and keep your abdominal muscles tight. Hold for __________  seconds. To release the stretch, shift your weight to your back foot. Repeat __________ times. Complete this exercise __________ times a day. Extension, standing  Stand and hold a broomstick, a cane, or a similar object behind your back. Your hands should be a little wider than shoulder-width apart. Your palms should face away from your back. Keeping your elbows straight and your shoulder muscles relaxed, move the stick away from your body until you feel a stretch in your shoulders (extension). Avoid shrugging your shoulders while you move the stick. Keep your shoulder blades tucked down toward the middle of your back. Hold for __________ seconds. Slowly return to the starting position. Repeat __________ times. Complete this exercise __________ times a day. Range-of-motion exercises Pendulum  Stand near a wall or a surface that you can hold onto for balance. Bend at the  waist and let your left / right arm hang straight down. Use your other arm to support you. Keep your back straight and do not lock your knees. Relax your left / right arm and shoulder muscles, and move your hips and your trunk so your left / right arm swings freely. Your arm should swing because of the motion of your body, not because you are using your arm or shoulder muscles. Keep moving your hips and trunk so your arm swings in the following directions, as told by your health care provider: Side to side. Forward and backward. In clockwise and counterclockwise circles. Continue each motion for __________ seconds, or for as long as told by your health care provider. Slowly return to the starting position. Repeat __________ times. Complete this exercise __________ times a day. Shoulder flexion, standing  Stand and hold a broomstick, a cane, or a similar object. Place your hands a little more than shoulder-width apart on the object. Your left / right hand should be palm-up, and your other hand should be palm-down. Keep your elbow straight and your shoulder muscles relaxed. Push the stick up with your healthy arm to raise your left / right arm in front of your body, and then over your head until you feel a stretch in your shoulder (flexion). Avoid shrugging your shoulder while you raise your arm. Keep your shoulder blade tucked down toward the middle of your back. Hold for __________ seconds. Slowly return to the starting position. Repeat __________ times. Complete this exercise __________ times a day. Shoulder abduction, standing  Stand and hold a broomstick, a cane, or a similar object. Place your hands a little more than shoulder-width apart on the object. Your left / right hand should be palm-up, and your other hand should be palm-down. Keep your elbow straight and your shoulder muscles relaxed. Push the object across your body toward your left / right side. Raise your left / right arm to the  side of your body (abduction) until you feel a stretch in your shoulder. Do not raise your arm above shoulder height unless your health care provider tells you to do that. If directed, raise your arm over your head. Avoid shrugging your shoulder while you raise your arm. Keep your shoulder blade tucked down toward the middle of your back. Hold for __________ seconds. Slowly return to the starting position. Repeat __________ times. Complete this exercise __________ times a day. Internal rotation  Place your left / right hand behind your back, palm-up. Use your other hand to dangle an exercise band, a broomstick, or a similar object over your shoulder. Grasp the band with your left / right  hand so you are holding on to both ends. Gently pull up on the band until you feel a stretch in the front of your left / right shoulder. The movement of your arm toward the center of your body is called internal rotation. Avoid shrugging your shoulder while you raise your arm. Keep your shoulder blade tucked down toward the middle of your back. Hold for __________ seconds. Release the stretch by letting go of the band and lowering your hands. Repeat __________ times. Complete this exercise __________ times a day. Strengthening exercises External rotation  Sit in a stable chair without armrests. Secure an exercise band to a stable object at elbow height on your left / right side. Place a soft object, such as a folded towel or a small pillow, between your left / right upper arm and your body to move your elbow about 4 inches (10 cm) away from your side. Hold the end of the exercise band so it is tight and there is no slack. Keeping your elbow pressed against the soft object, slowly move your forearm out, away from your abdomen (external rotation). Keep your body steady so only your forearm moves. Hold for __________ seconds. Slowly return to the starting position. Repeat __________ times. Complete this  exercise __________ times a day. Shoulder abduction  Sit in a stable chair without armrests, or stand up. Hold a __________ lb / kg weight in your left / right hand, or hold an exercise band with both hands. Start with your arms straight down and your left / right palm facing in, toward your body. Slowly lift your left / right hand out to your side (abduction). Do not lift your hand above shoulder height unless your health care provider tells you that this is safe. Keep your arms straight. Avoid shrugging your shoulder while you do this movement. Keep your shoulder blade tucked down toward the middle of your back. Hold for __________ seconds. Slowly lower your arm, and return to the starting position. Repeat __________ times. Complete this exercise __________ times a day. Shoulder extension  Sit in a stable chair without armrests, or stand up. Secure an exercise band to a stable object in front of you so it is at shoulder height. Hold one end of the exercise band in each hand. Straighten your elbows and lift your hands up to shoulder height. Squeeze your shoulder blades together as you pull your hands down to the sides of your thighs (extension). Stop when your hands are straight down by your sides. Do not let your hands go behind your body. Hold for __________ seconds. Slowly return to the starting position. Repeat __________ times. Complete this exercise __________ times a day. Shoulder row  Sit in a stable chair without armrests, or stand up. Secure an exercise band to a stable object in front of you so it is at chest height. Hold one end of the exercise band in each hand. Position your palms so that your thumbs are facing the ceiling (neutral position). Bend each of your elbows to a 90-degree angle (right angle) and keep your upper arms at your sides. Step back or move the chair back until the band is tight and there is no slack. Slowly pull your elbows back behind you. Hold for  __________ seconds. Slowly return to the starting position. Repeat __________ times. Complete this exercise __________ times a day. Shoulder press-ups  Sit in a stable chair that has armrests. Sit upright, with your feet flat on the floor.  Put your hands on the armrests so your elbows are bent and your fingers are pointing forward. Your hands should be about even with the sides of your body. Push down on the armrests and use your arms to lift yourself off the chair. Straighten your elbows and lift yourself up as much as you comfortably can. Move your shoulder blades down, and avoid letting your shoulders move up toward your ears. Keep your feet on the ground. As you get stronger, your feet should support less of your body weight as you lift yourself up. Hold for __________ seconds. Slowly lower yourself back into the chair. Repeat __________ times. Complete this exercise __________ times a day. Wall push-ups  Stand so you are facing a stable wall. Your feet should be about one arm-length away from the wall. Lean forward and place your palms on the wall at shoulder height. Keep your feet flat on the floor as you bend your elbows and lean forward toward the wall. Hold for __________ seconds. Straighten your elbows to push yourself back to the starting position. Repeat __________ times. Complete this exercise __________ times a day. This information is not intended to replace advice given to you by your health care provider. Make sure you discuss any questions you have with your health care provider. Document Revised: 03/25/2021 Document Reviewed: 03/25/2021 Elsevier Patient Education  2024 [employer] Exercises for Chronic Knee Pain Chronic knee pain is pain that lasts longer than 3 months. For most people with chronic knee pain, exercise and weight loss is an important part of treatment. Your health care provider may want you to focus on: Making the muscles that support your knee  stronger. This can take pressure off your knee and reduce pain. Preventing knee stiffness. How far you can move your knee, keeping it there or making it farther. Losing weight (if this applies) to take pressure off your knee, lower your risk for injury, and make it easier for you to exercise. Your provider will help you make an exercise program that fits your needs and physical abilities. Below are simple, low-impact exercises you can do at home. Ask your provider or physical therapist how often you should do your exercise program and how many times to repeat each exercise. General safety tips  Get your provider's approval before doing any exercises. Start slowly and stop any time you feel pain. Do not exercise if your knee pain is flaring up. Warm up first. Stretching a cold muscle can cause an injury. Do 5-10 minutes of easy movement or light stretching before beginning your exercises. Do 5-10 minutes of low-impact activity (like walking or cycling) before starting strengthening exercises. Contact your provider any time you have pain during or after exercising. Exercise can cause discomfort but should not be painful. It is normal to be a little stiff or sore after exercising. Stretching and range-of-motion exercises Front thigh stretch  Stand up straight and support your body by holding on to a chair or resting one hand on a wall. With your legs straight and close together, bend one knee to lift your heel up toward your butt. Using one hand for support, grab your ankle with your free hand. Pull your foot up closer toward your butt to feel the stretch in front of your thigh. Hold the stretch for 30 seconds. Repeat __________ times. Complete this exercise __________ times a day. Back thigh stretch  Sit on the floor with your back straight and your legs out straight in front of  you. Place the palms of your hands on the floor and slide them toward your feet as you bend at the hip. Try to  touch your nose to your knees and feel the stretch in the back of your thighs. Hold for 30 seconds. Repeat __________ times. Complete this exercise __________ times a day. Calf stretch  Stand facing a wall. Place the palms of your hands flat against the wall, arms extended, and lean slightly against the wall. Get into a lunge position with one leg bent at the knee and the other leg stretched out straight behind you. Keep both feet facing the wall and increase the bend in your knee while keeping the heel of the other leg flat on the ground. You should feel the stretch in your calf. Hold for 30 seconds. Repeat __________ times. Complete this exercise __________ times a day. Strengthening exercises Straight leg lift  Lie on your back with one knee bent and the other leg out straight. Slowly lift the straight leg without bending the knee. Lift until your foot is about 12 inches (30 cm) off the floor. Hold for 3-5 seconds and slowly lower your leg. Repeat __________ times. Complete this exercise __________ times a day. Single leg dip  Stand between two chairs and put both hands on the backs of the chairs for support. Extend one leg out straight with your body weight resting on the heel of the standing leg. Slowly bend your standing knee to dip your body to the level that is comfortable for you. Hold for 3-5 seconds. Repeat __________ times. Complete this exercise __________ times a day. Hamstring curls  Stand straight, knees close together, facing the back of a chair. Hold on to the back of a chair with both hands. Keep one leg straight. Bend the other knee while bringing the heel up toward the butt until the knee is bent at a 90-degree angle (right angle). Hold for 3-5 seconds. Repeat __________ times. Complete this exercise __________ times a day. Wall squat  Stand straight with your back, hips, and head against a wall. Step forward one foot at a time with your back still against the  wall. Your feet should be 2 feet (61 cm) from the wall at shoulder width. Keeping your back, hips, and head against the wall, slide down the wall to as close to a sitting position as you can get. Hold for 5-10 seconds, then slowly slide back up. Repeat __________ times. Complete this exercise __________ times a day. Step-ups  Stand in front of a sturdy platform or stool that is about 6 inches (15 cm) high. Slowly step up with your left / right foot, keeping your knee in line with your hip and foot. Do not let your knee bend so far that you cannot see your toes. Hold on to a chair for balance, but do not use it for support. Slowly unlock your knee and lower yourself to the starting position. Repeat __________ times. Complete this exercise __________ times a day. Contact a health care provider if: Your exercises cause pain. Your pain is worse after you exercise. Your pain prevents you from doing your exercises. This information is not intended to replace advice given to you by your health care provider. Make sure you discuss any questions you have with your health care provider. Document Revised: 02/17/2022 Document Reviewed: 02/17/2022 Elsevier Patient Education  2024 [employer].

## 2023-08-06 ENCOUNTER — Telehealth: Payer: Self-pay

## 2023-08-06 ENCOUNTER — Other Ambulatory Visit: Payer: Self-pay | Admitting: Family Medicine

## 2023-08-06 ENCOUNTER — Encounter: Payer: Self-pay | Admitting: Physician Assistant

## 2023-08-06 ENCOUNTER — Ambulatory Visit: Admitting: Physician Assistant

## 2023-08-06 VITALS — BP 132/79 | HR 69 | Temp 98.1°F | Ht 65.0 in | Wt 192.0 lb

## 2023-08-06 DIAGNOSIS — R234 Changes in skin texture: Secondary | ICD-10-CM

## 2023-08-06 DIAGNOSIS — E875 Hyperkalemia: Secondary | ICD-10-CM | POA: Diagnosis not present

## 2023-08-06 DIAGNOSIS — D509 Iron deficiency anemia, unspecified: Secondary | ICD-10-CM | POA: Diagnosis not present

## 2023-08-06 MED ORDER — OXYCODONE HCL 5 MG PO TABS: ORAL_TABLET | ORAL | 0 refills | Status: DC

## 2023-08-06 NOTE — Assessment & Plan Note (Signed)
 Patient presents today with concern for dark spot on her left nipple. No tenderness, erythema, swelling, nipple discharge, or dimpling of the skin. Skin change most consistent for benign nevi. Mammogram ordered for patient's peace of mind. Reassurance given today. Very low suspicion for infectious or acute pathology.

## 2023-08-06 NOTE — Telephone Encounter (Signed)
 Prescription was sent in as requested, keep follow-up visit next week

## 2023-08-06 NOTE — Progress Notes (Signed)
   Acute Office Visit  Subjective:     [name], female    DOB: 01/10/1941, 83 y.o.   MRN: [id number]   Patient presents today with new concern for a dark spot on her left nipple. She denies fevers, malaise, weight loss, erythema, swelling, breast tenderness, or nipple discharge. She endorses occasional pruritus. She relates she recently cancelled a mammogram because she did not think it was warranted at her age. No abnormal mammograms in the past.      Review of Systems  Constitutional:  Negative for chills, fever, malaise/fatigue and weight loss.  Skin:  Positive for itching. Negative for rash.       Dark spot on breast  Neurological:  Negative for dizziness and headaches.  Endo/Heme/Allergies:  Does not bruise/bleed easily.        Objective:     BP 132/79   Pulse 69   Temp 98.1 F (36.7 C)   Ht 5' 5 (1.651 m)   Wt 192 lb (87.1 kg)   SpO2 98%   BMI 31.95 kg/m   Physical Exam Constitutional:      Appearance: Normal appearance. She is obese.  HENT:     Head: Normocephalic and atraumatic.   Cardiovascular:     Rate and Rhythm: Normal rate and regular rhythm.     Heart sounds: Normal heart sounds.  Pulmonary:     Effort: Pulmonary effort is normal.     Breath sounds: Normal breath sounds.  Chest:    Skin:    General: Skin is warm and dry.     Findings: No bruising, erythema or rash.   Neurological:     General: No focal deficit present.     Mental Status: She is alert.     No results found for any visits on 08/06/23.      Assessment & Plan:  Change of skin of breast Assessment & Plan: Patient presents today with concern for dark spot on her left nipple. No tenderness, erythema, swelling, nipple discharge, or dimpling of the skin. Skin change most consistent for benign nevi. Mammogram ordered for patient's peace of mind. Reassurance given today. Very low suspicion for infectious or acute pathology.   Orders: -     3D Screening  Mammogram, Left and Right    No follow-ups on file.  Jearlean Mince Ishmael Berkovich, PA-C

## 2023-08-06 NOTE — Telephone Encounter (Signed)
 Error

## 2023-08-06 NOTE — Telephone Encounter (Signed)
 Prescription Request  08/06/2023  LOV: Visit date not found  What is the name of the medication or equipment? oxyCODONE  (ROXICODONE ) 5 MG immediate release tablet   Have you contacted your pharmacy to request a refill? Yes   Which pharmacy would you like this sent to?  [facility] [address] [place] - [address] [place] [address] Phone: [phone] Fax: [phone]    Patient notified that their request is being sent to the clinical staff for review and that they should receive a response within 2 business days.   Please advise at Mobile [phone] (mobile)

## 2023-08-07 ENCOUNTER — Ambulatory Visit: Payer: Self-pay | Admitting: Family Medicine

## 2023-08-07 LAB — BASIC METABOLIC PANEL WITH GFR
BUN/Creatinine Ratio: 21 (ref 12–28)
BUN: 16 mg/dL (ref 8–27)
CO2: 24 mmol/L (ref 20–29)
Calcium: 9.5 mg/dL (ref 8.7–10.3)
Chloride: 102 mmol/L (ref 96–106)
Creatinine, Ser: 0.77 mg/dL (ref 0.57–1.00)
Glucose: 100 mg/dL — ABNORMAL HIGH (ref 70–99)
Potassium: 4.9 mmol/L (ref 3.5–5.2)
Sodium: 139 mmol/L (ref 134–144)
eGFR: 77 mL/min/{1.73_m2} (ref >59)

## 2023-08-07 LAB — IRON,TIBC AND FERRITIN PANEL
Ferritin: 75 ng/mL (ref 15–150)
Iron Saturation: 28 % (ref 15–55)
Iron: 91 ug/dL (ref 27–139)
Total Iron Binding Capacity: 327 ug/dL (ref 250–450)
UIBC: 236 ug/dL (ref 118–369)

## 2023-08-07 LAB — CBC WITH DIFFERENTIAL/PLATELET
Basophils Absolute: 0 10*3/uL (ref 0.0–0.2)
Basos: 1 %
EOS (ABSOLUTE): 0.2 10*3/uL (ref 0.0–0.4)
Eos: 6 %
Hematocrit: 39.8 % (ref 34.0–46.6)
Hemoglobin: 12.8 g/dL (ref 11.1–15.9)
Immature Grans (Abs): 0 10*3/uL (ref 0.0–0.1)
Immature Granulocytes: 0 %
Lymphocytes Absolute: 1.7 10*3/uL (ref 0.7–3.1)
Lymphs: 44 %
MCH: 30.8 pg (ref 26.6–33.0)
MCHC: 32.2 g/dL (ref 31.5–35.7)
MCV: 96 fL (ref 79–97)
Monocytes Absolute: 0.4 10*3/uL (ref 0.1–0.9)
Monocytes: 10 %
Neutrophils Absolute: 1.5 10*3/uL (ref 1.4–7.0)
Neutrophils: 39 %
Platelets: 218 10*3/uL (ref 150–450)
RBC: 4.15 x10E6/uL (ref 3.77–5.28)
RDW: 12.9 % (ref 11.7–15.4)
WBC: 3.9 10*3/uL (ref 3.4–10.8)

## 2023-08-11 ENCOUNTER — Ambulatory Visit (INDEPENDENT_AMBULATORY_CARE_PROVIDER_SITE_OTHER): Admitting: Family Medicine

## 2023-08-11 ENCOUNTER — Encounter: Payer: Self-pay | Admitting: Family Medicine

## 2023-08-11 VITALS — BP 120/68 | HR 73 | Temp 97.6°F | Ht 65.0 in | Wt 189.0 lb

## 2023-08-11 DIAGNOSIS — M1711 Unilateral primary osteoarthritis, right knee: Secondary | ICD-10-CM

## 2023-08-11 DIAGNOSIS — B369 Superficial mycosis, unspecified: Secondary | ICD-10-CM

## 2023-08-11 DIAGNOSIS — G8929 Other chronic pain: Secondary | ICD-10-CM | POA: Diagnosis not present

## 2023-08-11 DIAGNOSIS — R251 Tremor, unspecified: Secondary | ICD-10-CM

## 2023-08-11 DIAGNOSIS — M25511 Pain in right shoulder: Secondary | ICD-10-CM | POA: Diagnosis not present

## 2023-08-11 DIAGNOSIS — R002 Palpitations: Secondary | ICD-10-CM

## 2023-08-11 DIAGNOSIS — F419 Anxiety disorder, unspecified: Secondary | ICD-10-CM

## 2023-08-11 MED ORDER — BUSPIRONE HCL 5 MG PO TABS
5.0000 mg | ORAL_TABLET | Freq: Two times a day (BID) | ORAL | 4 refills | Status: AC
Start: 2023-08-11 — End: ?

## 2023-08-11 MED ORDER — KETOCONAZOLE 2 % EX CREA: 1.0000 | TOPICAL_CREAM | Freq: Every day | CUTANEOUS | 5 refills | Status: AC

## 2023-08-11 NOTE — Progress Notes (Signed)
   Subjective:    [name], female    DOB: [birth year], 83 y.o.   MRN: [id number]  HPI Anxiety ongoing and pain of multiple areas including right hip, buttock has kept her up 3 nights - 3 month follow up Refill cream Very nice patient Dealing with a lot of joint pain It is worse currently in the right shoulder and right knee.  Her orthopedic surgeon has recommended surgery on both of these She is contemplating which 1 she wants to have done first She states her overall breathing when she moves around is good she just hurts more so with the knee and the shoulder.  She denies any chest tightness pressure pain shortness of breath She does relate a lot of fatigue related to the chronic pain she uses oxycodone  on a sparing basis does not abuse it it does cause some slowdown of her bowels but does not cause drowsiness She does not tolerate codeine She notes to keep her medicine in a safe place  She is also dealing with depression and anxiety she states her moods are doing better but she relates chronic anxiousness being worried a lot about her son who has autism and a seizure disorder Review of Systems     Objective:   Physical Exam  General-in no acute distress Eyes-no discharge Lungs-respiratory rate normal, CTA CV-no murmurs,RRR Extremities skin warm dry no edema Neuro grossly normal Behavior normal, alert       Assessment & Plan:  1. Fungal infection of skin She requested refill this was given - ketoconazole  (NIZORAL ) 2 % cream; Apply 1 Application topically daily.  Dispense: 60 g; Refill: 5  2. Palpitation (Primary) Intermittent palpitations will go ahead with telemetry and order this accordingly  3. Tremor Very fine tremor in the hands not consistent with Parkinson's-currently right now the patient will work with this rather than initiate any medicines at this time  4. Chronic right shoulder pain She is contemplating surgery from the medical point of  view she can do surgery but she would need cardiac clearance  5. Osteoarthritis of right knee, unspecified osteoarthritis type She is contemplating surgery from a medical point of view she can do surgery but she would need to do cardiac clearance  6. Anxiety Continue the Prozac  40 mg daily She is dealing with the stress of her adult son who has autism and seizure disorder BuSpar initiated shared discussion 5 mg twice daily patient to speak back  Follow-up within 4 to 6 months sooner if any problems

## 2023-08-12 ENCOUNTER — Ambulatory Visit: Payer: Self-pay | Attending: Family Medicine

## 2023-08-12 ENCOUNTER — Telehealth: Payer: Self-pay | Admitting: Family Medicine

## 2023-08-12 DIAGNOSIS — R002 Palpitations: Secondary | ICD-10-CM

## 2023-08-12 NOTE — Progress Notes (Unsigned)
 EP to read.

## 2023-08-12 NOTE — Telephone Encounter (Signed)
 Nurses-please request a 2-week telemetry per protocol.  Diagnosis palpitations  (This protocol allows for telemetry to be mailed directly to the patient rather than having to refer her to cardiology-Autumn is familiar with this process if you are not-please ask any questions if uncertain)

## 2023-08-12 NOTE — Telephone Encounter (Signed)
 Per Dr Glendia- change to 7 day telemetry monitor- Home telemetry monitor ordered in [facility]

## 2023-08-23 ENCOUNTER — Ambulatory Visit (HOSPITAL_COMMUNITY)

## 2023-08-23 ENCOUNTER — Encounter (HOSPITAL_COMMUNITY): Payer: Self-pay

## 2023-08-30 ENCOUNTER — Other Ambulatory Visit: Payer: Self-pay | Admitting: Family Medicine

## 2023-08-30 ENCOUNTER — Telehealth: Payer: Self-pay | Admitting: Family Medicine

## 2023-08-30 MED ORDER — OXYCODONE HCL 5 MG PO TABS: ORAL_TABLET | ORAL | 0 refills | Status: DC

## 2023-08-30 NOTE — Telephone Encounter (Signed)
 08/06/23 15 tab/0 RF

## 2023-08-30 NOTE — Telephone Encounter (Signed)
 Still even though she gets intermittent pain medicine only 15 at a time it is still necessary to do our office visit every 3 to 4 months to assure that we are managing this issue I just recently saw her but it would be wise to go ahead and get her on the books for early November  Otherwise when we get later into the fall I will not be able to prescribe this medicine even intermittently-oxycodone   Please notify patient to go ahead and set up an appointment with me in November thank you

## 2023-08-30 NOTE — Telephone Encounter (Signed)
 Copied from CRM [id number]. Topic: Clinical - Medication Refill >> Aug 30, 2023  3:39 PM Emylou G wrote: Medication: oxyCODONE  (ROXICODONE ) 5 MG immediate release tablet  Has the patient contacted their pharmacy? Yes (Agent: If no, request that the patient contact the pharmacy for the refill. If patient does not wish to contact the pharmacy document the reason why and proceed with request.) (Agent: If yes, when and what did the pharmacy advise?) call us   This is the patient's preferred pharmacy:  [facility] [address], [place] - [address] [place] [address] Phone: [phone] Fax: [phone]  Is this the correct pharmacy for this prescription? Yes If no, delete pharmacy and type the correct one.   Has the prescription been filled recently? Yes in June  Is the patient out of the medication? Yes  Has the patient been seen for an appointment in the last year OR does the patient have an upcoming appointment? Yes  Can we respond through MyChart? Yes  Agent: Please be advised that Rx refills may take up to 3 business days. We ask that you follow-up with your pharmacy.

## 2023-09-01 NOTE — Telephone Encounter (Signed)
 Patient notified and scheduled pain management visit 09/28/2023 at 10:00 AM with Dr Glendia

## 2023-09-08 DIAGNOSIS — R002 Palpitations: Secondary | ICD-10-CM | POA: Diagnosis not present

## 2023-09-17 DIAGNOSIS — M545 Low back pain, unspecified: Secondary | ICD-10-CM | POA: Diagnosis not present

## 2023-09-17 DIAGNOSIS — M19011 Primary osteoarthritis, right shoulder: Secondary | ICD-10-CM | POA: Diagnosis not present

## 2023-09-17 DIAGNOSIS — M25552 Pain in left hip: Secondary | ICD-10-CM | POA: Diagnosis not present

## 2023-09-21 ENCOUNTER — Ambulatory Visit: Payer: Self-pay | Admitting: Family Medicine

## 2023-09-21 DIAGNOSIS — R002 Palpitations: Secondary | ICD-10-CM | POA: Diagnosis not present

## 2023-09-22 ENCOUNTER — Encounter (INDEPENDENT_AMBULATORY_CARE_PROVIDER_SITE_OTHER): Payer: Self-pay | Admitting: Gastroenterology

## 2023-09-22 NOTE — Telephone Encounter (Signed)
 2nd clearance request received. Will route to Preop pool for review.

## 2023-09-28 ENCOUNTER — Other Ambulatory Visit: Payer: Self-pay

## 2023-09-28 ENCOUNTER — Ambulatory Visit: Admitting: Family Medicine

## 2023-09-28 VITALS — BP 126/68 | HR 70 | Temp 97.7°F | Ht 65.0 in | Wt 196.0 lb

## 2023-09-28 DIAGNOSIS — M7918 Myalgia, other site: Secondary | ICD-10-CM | POA: Diagnosis not present

## 2023-09-28 DIAGNOSIS — Z79899 Other long term (current) drug therapy: Secondary | ICD-10-CM | POA: Diagnosis not present

## 2023-09-28 DIAGNOSIS — M25511 Pain in right shoulder: Secondary | ICD-10-CM | POA: Diagnosis not present

## 2023-09-28 DIAGNOSIS — G5603 Carpal tunnel syndrome, bilateral upper limbs: Secondary | ICD-10-CM

## 2023-09-28 DIAGNOSIS — G8929 Other chronic pain: Secondary | ICD-10-CM | POA: Diagnosis not present

## 2023-09-28 DIAGNOSIS — R21 Rash and other nonspecific skin eruption: Secondary | ICD-10-CM

## 2023-09-28 DIAGNOSIS — B001 Herpesviral vesicular dermatitis: Secondary | ICD-10-CM | POA: Diagnosis not present

## 2023-09-28 DIAGNOSIS — E785 Hyperlipidemia, unspecified: Secondary | ICD-10-CM | POA: Diagnosis not present

## 2023-09-28 MED ORDER — OXYCODONE HCL 5 MG PO TABS: ORAL_TABLET | ORAL | 0 refills | Status: DC

## 2023-09-28 MED ORDER — VALACYCLOVIR HCL 500 MG PO TABS: ORAL_TABLET | ORAL | 3 refills | Status: AC

## 2023-09-28 MED ORDER — HYDROCORTISONE 2.5 % EX CREA: TOPICAL_CREAM | CUTANEOUS | 0 refills | Status: AC

## 2023-09-28 MED ORDER — ROSUVASTATIN CALCIUM 5 MG PO TABS: 5.0000 mg | ORAL_TABLET | Freq: Every day | ORAL | 1 refills | Status: DC
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
## 2023-10-22 DIAGNOSIS — M79642 Pain in left hand: Secondary | ICD-10-CM | POA: Insufficient documentation

## 2023-10-27 DIAGNOSIS — M1812 Unilateral primary osteoarthritis of first carpometacarpal joint, left hand: Secondary | ICD-10-CM | POA: Insufficient documentation

## 2023-11-05 ENCOUNTER — Other Ambulatory Visit: Payer: Self-pay | Admitting: Nurse Practitioner

## 2023-11-05 ENCOUNTER — Ambulatory Visit: Payer: Self-pay

## 2023-11-05 MED ORDER — AMOXICILLIN-POT CLAVULANATE 875-125 MG PO TABS
1.0000 | ORAL_TABLET | Freq: Two times a day (BID) | ORAL | 0 refills | Status: DC
Start: 2023-11-05 — End: 2023-12-14

## 2023-11-05 NOTE — Telephone Encounter (Signed)
 FYI Only or Action Required?: Action required by provider: Requesting medicine for Diverticulitis flare up.  Patient was last seen in primary care on 09/28/2023 by Alphonsa Glendia LABOR, MD.  Called Nurse Triage reporting Abdominal Pain.  Symptoms began 2 days ago.  Interventions attempted: Rest, hydration, or home remedies.  Symptoms are: unchanged.  Triage Disposition: See Physician Within 24 Hours  Patient/caregiver understands and will follow disposition?: No, wishes to speak with PCP Reason for Disposition  [1] MILD pain (e.g., does not interfere with normal activities) AND [2] pain comes and goes (cramps) AND [3] present > 48 hours  (Exception: This same abdominal pain is a chronic symptom recurrent or ongoing AND present > 4 weeks.)  Answer Assessment - Initial Assessment Questions Patient's husband, Lamar calling on behalf of patient. Hx of Diverticulitis. No appointments available today, patient stated she really does not want to go anywhere and is requesting PCP send in something to [place] pharmacy on file. Requesting call back [phone]  1. LOCATION: Where does it hurt?      Lower left quadrant  2. RADIATION: Does the pain shoot anywhere else? (e.g., chest, back)     No  3. ONSET: When did the pain begin? (e.g., minutes, hours or days ago)      2 days ago  4. SUDDEN: Gradual or sudden onset?     Was there when she woke up   5. PATTERN Does the pain come and go, or is it constant?     Comes and goes, but doesn't leave completely, gets dull and then sharp again  6. SEVERITY: How bad is the pain?  (e.g., Scale 1-10; mild, moderate, or severe)     Moderate, tenderness when pressing on area  7. RECURRENT SYMPTOM: Have you ever had this type of stomach pain before? If Yes, ask: When was the last time? and What happened that time?      Yes, dx with diverticulitis   8. CAUSE: What do you think is causing the stomach pain? (e.g., gallstones, recent  abdominal surgery)     Diverticulitis  9. RELIEVING/AGGRAVATING FACTORS: What makes it better or worse? (e.g., antacids, bending or twisting motion, bowel movement)     No   10. OTHER SYMPTOMS: Do you have any other symptoms? (e.g., back pain, diarrhea, fever, urination pain, vomiting)       No, denies fever, N/V, change in bowel habits  Protocols used: Abdominal Pain - [facility]  Copied from CRM #[id number]. Topic: Clinical - Red Word Triage >> Nov 05, 2023 10:25 AM Antwanette L wrote: Red Word that prompted transfer to Nurse Triage: Possible diverticulitis. Patient is experiencing abdominal pain on the left side

## 2023-11-10 ENCOUNTER — Other Ambulatory Visit: Payer: Self-pay | Admitting: Family Medicine

## 2023-11-10 NOTE — Telephone Encounter (Unsigned)
 Copied from CRM #[id number]. Topic: Clinical - Medication Refill >> Nov 10, 2023  4:45 PM Drema MATSU wrote: Medication: oxyCODONE  (OXY IR/ROXICODONE ) 5 MG immediate release tablet  Has the patient contacted their pharmacy? No (Agent: If no, request that the patient contact the pharmacy for the refill. If patient does not wish to contact the pharmacy document the reason why and proceed with request.) call provider  (Agent: If yes, when and what did the pharmacy advise?)  This is the patient's preferred pharmacy:  [facility] [address] [place] - [address] [place] [address] Phone: [phone] Fax: [phone]  Is this the correct pharmacy for this prescription? Yes If no, delete pharmacy and type the correct one.   Has the prescription been filled recently? Yes  Is the patient out of the medication? Yes  Has the patient been seen for an appointment in the last year OR does the patient have an upcoming appointment? Yes  Can we respond through MyChart? Yes  Agent: Please be advised that Rx refills may take up to 3 business days. We ask that you follow-up with your pharmacy.

## 2023-11-11 MED ORDER — OXYCODONE HCL 5 MG PO TABS: ORAL_TABLET | ORAL | 0 refills | Status: DC

## 2023-11-19 ENCOUNTER — Other Ambulatory Visit: Payer: Self-pay

## 2023-11-19 ENCOUNTER — Ambulatory Visit: Payer: Self-pay

## 2023-11-19 MED ORDER — CEPHALEXIN 500 MG PO CAPS
500.0000 mg | ORAL_CAPSULE | Freq: Three times a day (TID) | ORAL | 0 refills | Status: AC
Start: 2023-11-19 — End: 2023-11-24

## 2023-11-19 NOTE — Telephone Encounter (Signed)
 May do keflex  500 one tid for 5days If ongoing then would need in person OV with any provider here

## 2023-11-19 NOTE — Telephone Encounter (Signed)
 Her current symptoms are lower pelvic pressure ongoing , urinary frequency, brown/ orange discharge on and off No fever, dysuria, chills, denies side abd or back pain.

## 2023-11-19 NOTE — Telephone Encounter (Signed)
 Nurses Please call patient Sounds like she may have ongoing urinary symptoms Ideally we would like for her to be seen and check a urine If no available slots today Please at least get a rundown of her symptoms currently

## 2023-11-19 NOTE — Telephone Encounter (Signed)
 FYI Only or Action Required?: Action required by provider: update on patient condition.  Patient was last seen in primary care on 09/28/2023 by Alphonsa Glendia LABOR, MD.  Called Nurse Triage reporting No chief complaint on file..  Symptoms began several weeks ago.  Interventions attempted: Prescription medications: Amoxicillin .  Symptoms are: unchanged.  Triage Disposition: See Physician Within 24 Hours  Patient/caregiver understands and will follow disposition?: No, wishes to speak with PCP        Copied from CRM #[id number]. Topic: Clinical - Red Word Triage >> Nov 19, 2023  9:57 AM Ivette P wrote: Red Word that prompted transfer to Nurse Triage: bladder infection. Pressure on the bladder. Had amoxacillin finished medication - 09/19       Reason for Disposition  Age > 50 years  Answer Assessment - Initial Assessment Questions Patient reports her symptoms have not improved with the Amoxicillin  she was recently treated with, stating she is still having the urinary pressure. Patient states she is unable to come in for an appointment today and would like another antibiotic prescribed if possible. Urgent Care/ED precautions discussed. Please advise.     1. SEVERITY: How bad is the pain?  (e.g., Scale 1-10; mild, moderate, or severe)     Mild 3. PATTERN: Is pain present every time you urinate or just sometimes?      Every time  4. ONSET: When did the painful urination start?      A couple of weeks  5. FEVER: Do you have a fever? If Yes, ask: What is your temperature, how was it measured, and when did it start?     No 6. PAST UTI: Have you had a urine infection before? If Yes, ask: When was the last time? and What happened that time?      Yes 7. CAUSE: What do you think is causing the painful urination?  (e.g., UTI, scratch, Herpes sore)     UTI/Bladder infection  8. OTHER SYMPTOMS: Do you have any other symptoms? (e.g., blood in urine, flank pain, genital  sores, urgency, vaginal discharge)     Increased bladder pressure  Protocols used: Urination Pain - Female-A-AH

## 2023-11-19 NOTE — Telephone Encounter (Signed)
Keflex sent in.

## 2023-11-29 DIAGNOSIS — H25811 Combined forms of age-related cataract, right eye: Secondary | ICD-10-CM | POA: Diagnosis not present

## 2023-11-29 DIAGNOSIS — H04123 Dry eye syndrome of bilateral lacrimal glands: Secondary | ICD-10-CM | POA: Diagnosis not present

## 2023-11-29 DIAGNOSIS — H43391 Other vitreous opacities, right eye: Secondary | ICD-10-CM | POA: Diagnosis not present

## 2023-11-29 DIAGNOSIS — H1045 Other chronic allergic conjunctivitis: Secondary | ICD-10-CM | POA: Diagnosis not present

## 2023-11-29 DIAGNOSIS — H40013 Open angle with borderline findings, low risk, bilateral: Secondary | ICD-10-CM | POA: Diagnosis not present

## 2023-12-01 ENCOUNTER — Encounter (INDEPENDENT_AMBULATORY_CARE_PROVIDER_SITE_OTHER): Payer: Self-pay | Admitting: Gastroenterology

## 2023-12-03 ENCOUNTER — Encounter: Payer: Self-pay | Admitting: Family Medicine

## 2023-12-14 ENCOUNTER — Other Ambulatory Visit: Payer: Self-pay | Admitting: Family Medicine

## 2023-12-14 MED ORDER — OXYCODONE HCL 5 MG PO TABS: ORAL_TABLET | ORAL | 0 refills | Status: DC

## 2023-12-21 DIAGNOSIS — G5601 Carpal tunnel syndrome, right upper limb: Secondary | ICD-10-CM | POA: Insufficient documentation

## 2023-12-21 DIAGNOSIS — M1812 Unilateral primary osteoarthritis of first carpometacarpal joint, left hand: Secondary | ICD-10-CM | POA: Diagnosis not present

## 2023-12-21 DIAGNOSIS — G5603 Carpal tunnel syndrome, bilateral upper limbs: Secondary | ICD-10-CM | POA: Diagnosis not present

## 2023-12-21 DIAGNOSIS — G5602 Carpal tunnel syndrome, left upper limb: Secondary | ICD-10-CM | POA: Insufficient documentation

## 2023-12-22 DIAGNOSIS — L814 Other melanin hyperpigmentation: Secondary | ICD-10-CM | POA: Diagnosis not present

## 2023-12-22 DIAGNOSIS — D1801 Hemangioma of skin and subcutaneous tissue: Secondary | ICD-10-CM | POA: Diagnosis not present

## 2023-12-22 DIAGNOSIS — L821 Other seborrheic keratosis: Secondary | ICD-10-CM | POA: Diagnosis not present

## 2023-12-22 DIAGNOSIS — L400 Psoriasis vulgaris: Secondary | ICD-10-CM | POA: Diagnosis not present

## 2024-01-03 ENCOUNTER — Telehealth: Payer: Self-pay | Admitting: Pharmacist

## 2024-01-03 ENCOUNTER — Ambulatory Visit: Admitting: Family Medicine

## 2024-01-03 ENCOUNTER — Encounter: Payer: Self-pay | Admitting: Family Medicine

## 2024-01-03 VITALS — BP 120/60 | HR 73 | Temp 98.4°F | Ht 65.0 in | Wt 201.0 lb

## 2024-01-03 DIAGNOSIS — E785 Hyperlipidemia, unspecified: Secondary | ICD-10-CM | POA: Diagnosis not present

## 2024-01-03 DIAGNOSIS — M1711 Unilateral primary osteoarthritis, right knee: Secondary | ICD-10-CM

## 2024-01-03 DIAGNOSIS — R1032 Left lower quadrant pain: Secondary | ICD-10-CM | POA: Diagnosis not present

## 2024-01-03 DIAGNOSIS — M7918 Myalgia, other site: Secondary | ICD-10-CM

## 2024-01-03 DIAGNOSIS — Z5181 Encounter for therapeutic drug level monitoring: Secondary | ICD-10-CM | POA: Diagnosis not present

## 2024-01-03 DIAGNOSIS — Z79891 Long term (current) use of opiate analgesic: Secondary | ICD-10-CM | POA: Diagnosis not present

## 2024-01-03 DIAGNOSIS — Z79899 Other long term (current) drug therapy: Secondary | ICD-10-CM | POA: Diagnosis not present

## 2024-01-03 MED ORDER — OXYCODONE HCL 5 MG PO TABS: ORAL_TABLET | ORAL | 0 refills | Status: DC

## 2024-01-03 MED ORDER — ROSUVASTATIN CALCIUM 5 MG PO TABS: 5.0000 mg | ORAL_TABLET | Freq: Every day | ORAL | 1 refills | Status: DC

## 2024-01-03 MED ORDER — CEPHALEXIN 500 MG PO CAPS: 500.0000 mg | ORAL_CAPSULE | Freq: Three times a day (TID) | ORAL | 0 refills | Status: DC

## 2024-01-03 NOTE — Progress Notes (Signed)
 She  Subjective:    [name], female    DOB: [birth year], 83 y.o.   MRN: [id number]  HPI Patient feels she has been doing fairly good She does feel little bit stressed about how her health is doing She denies any major setbacks She is open to doing a flu shot She has had some lower abdominal pressure and pressure with urination and she wondered if it may be related to the possibility of a UTI denied dysuria No rectal bleeding or hematuria Appetite doing okay Relates mild pain and aching and discomfort in her joints more in the right knee lower back.  Tylenol  alone does not give her adequate relief sometimes the pain is pretty intense Hydrocodone  made her feel sick We are using low-dose oxycodone  half a tablet no greater than twice a day as needed She states it does bring the pain level down to a more manageable pain    Review of Systems     Objective:   Physical Exam General-in no acute distress Eyes-no discharge Lungs-respiratory rate normal, CTA CV-no murmurs,RRR Extremities skin warm dry no edema Neuro grossly normal Behavior normal, alert        Assessment & Plan:   1. LLQ abdominal pain (Primary) I find no evidence of any severe infection urinalysis does show some occasional WBCs and small amount of blood reasonable to treat with antibiotics for 5 days if ongoing troubles follow-up - Urinalysis, Complete  2. Musculoskeletal pain Tylenol  for the most part when necessary she may use low-dose oxycodone   3. Osteoarthritis of right knee, unspecified osteoarthritis type Low-dose oxycodone  when necessary  4. Encounter for monitoring opioid maintenance therapy The patient was seen in followup for chronic pain. A review over at their current pain status was discussed. Drug registry was checked. Prescriptions were given.  Regular follow-up recommended. Discussion was held regarding the importance of compliance with medication as well as pain medication  contract.  Patient was informed that medication may cause drowsiness and should not be combined  with other medications/alcohol or street drugs. If the patient feels medication is causing altered alertness then do not drive or operate dangerous equipment.  Should be noted that the patient appears to be meeting appropriate use of opioids and response.  Evidenced by improved function and decent pain control without significant side effects and no evidence of overt aberrancy issues.  Upon discussion with the patient today they understand that opioid therapy is optional and they feel that the pain has been refractory to reasonable conservative measures and is significant and affecting quality of life enough to warrant ongoing therapy and wishes to continue opioids.  Refills were provided.  [place]  medical Board guidelines regarding the pain medicine has been reviewed.  CDC guidelines most updated 2022 has been reviewed by the prescriber.  PDMP is checked on a regular basis yearly urine drug screen and pain management contract  Treatment plan for this patient includes #1-gentle stretching exercises as shown daily basis 2.  Mild strength exercises 3 times per week #3 continue pain medications #4 notify us  if any digression  Patient will do her lab work somewhere within the next month Patient will follow-up in 3 months

## 2024-01-03 NOTE — Telephone Encounter (Signed)
   This patient is appearing on a report for being at risk of failing the adherence measure for cholesterol (statin) medications this calendar year.   Medication: rosuvastatin  5mg  Last fill date: 08/26/23 for 90 day supply  Contacted pharmacy to facilitate refills. and Will collaborate with provider to facilitate refill needs.   Makaylah Oddo Dattero Ardice Boyan, PharmD, BCACP, CPP Clinical Pharmacist, [facility]

## 2024-01-04 ENCOUNTER — Ambulatory Visit: Payer: Self-pay | Admitting: Family Medicine

## 2024-01-04 ENCOUNTER — Telehealth: Payer: Self-pay

## 2024-01-04 LAB — BASIC METABOLIC PANEL WITH GFR
BUN/Creatinine Ratio: 27 (ref 12–28)
BUN: 25 mg/dL (ref 8–27)
CO2: 24 mmol/L (ref 20–29)
Calcium: 9.3 mg/dL (ref 8.7–10.3)
Chloride: 100 mmol/L (ref 96–106)
Creatinine, Ser: 0.92 mg/dL (ref 0.57–1.00)
Glucose: 87 mg/dL (ref 70–99)
Potassium: 4.4 mmol/L (ref 3.5–5.2)
Sodium: 138 mmol/L (ref 134–144)
eGFR: 62 mL/min/1.73 (ref >59)

## 2024-01-04 LAB — LIPID PANEL
Chol/HDL Ratio: 4.4 ratio (ref 0.0–4.4)
Cholesterol, Total: 267 mg/dL — ABNORMAL HIGH (ref 100–199)
HDL: 61 mg/dL (ref >39)
LDL Chol Calc (NIH): 178 mg/dL — ABNORMAL HIGH (ref 0–99)
Triglycerides: 153 mg/dL — ABNORMAL HIGH (ref 0–149)
VLDL Cholesterol Cal: 28 mg/dL (ref 5–40)

## 2024-01-04 LAB — HEPATIC FUNCTION PANEL
ALT: 13 IU/L (ref 0–32)
AST: 14 IU/L (ref 0–40)
Albumin: 4.5 g/dL (ref 3.7–4.7)
Alkaline Phosphatase: 66 IU/L (ref 48–129)
Bilirubin Total: <0.2 mg/dL (ref 0.0–1.2)
Bilirubin, Direct: <0.08 mg/dL (ref 0.00–0.40)
Total Protein: 6.6 g/dL (ref 6.0–8.5)

## 2024-01-04 NOTE — Addendum Note (Signed)
 Addended by: DELORES LIONEL RAMAN on: 01/04/2024 03:45 PM   Modules accepted: Orders

## 2024-01-04 NOTE — Telephone Encounter (Signed)
 Copied from CRM [id number]. Topic: Clinical - Lab/Test Results >> Jan 04, 2024  3:17 PM Berwyn MATSU wrote: Reason for CRM: Patients husband called in requesting to speak to care team regarding patients  Cholestrol. Per Patients spouse [name] Zettel he has questions and is requesting a call back.  May you please assist.

## 2024-01-04 NOTE — Telephone Encounter (Signed)
 Pt states that the pharmacy refilled Rosuvastatin  for her which she is not interested in taking. She states that she was told to try Repatha I informed pt that a message has been sent to Dr Glendia regarding the Repatha and that he will respond as soon as he can.

## 2024-01-10 ENCOUNTER — Encounter: Payer: Self-pay | Admitting: Family Medicine

## 2024-01-10 ENCOUNTER — Other Ambulatory Visit: Payer: Self-pay | Admitting: Family Medicine

## 2024-01-10 ENCOUNTER — Telehealth: Payer: Self-pay | Admitting: Family Medicine

## 2024-01-10 ENCOUNTER — Telehealth: Payer: Self-pay | Admitting: Pharmacist

## 2024-01-10 DIAGNOSIS — M1711 Unilateral primary osteoarthritis, right knee: Secondary | ICD-10-CM | POA: Diagnosis not present

## 2024-01-10 DIAGNOSIS — E782 Mixed hyperlipidemia: Secondary | ICD-10-CM

## 2024-01-10 NOTE — Telephone Encounter (Signed)
 Patient unable to take statins Will arrange for follow up with PharmD for hyperlipidemia options [id number] placed Intolerance list updated  Mliss Tarry Griffin, PharmD, BCACP, CPP Clinical Pharmacist, [facility]

## 2024-01-10 NOTE — Telephone Encounter (Signed)
 Nurses Patient has elevated cholesterol.  She also has aortic atherosclerosis.  Back in 2023 she had a coronary calcium  scan which showed increased calcium  deposits in the coronary arteries which correlates with cholesterol in the coronary arteries  The goal is to get her LDL below 70 She has not been tolerant of statins.  They cause muscle aches and discomforts.  She takes low-dose statin in the past but has not been consistent because of the muscle pains.  She remains well above goal on her cholesterol.  Given this it would be in her best interest to be on Repatha or Praluent.  Please consult pharmacy team to evaluate this patient and to see what they can do about getting 1 or the other approved.  I will send the patient a MyChart message letting her know as well.  Thanks-Dr. Glendia

## 2024-01-18 NOTE — Progress Notes (Deleted)
 Office Visit Note  Patient: Ruth [name]             Date of Birth: 1940/05/12           MRN: [id number]             PCP: Alphonsa Glendia LABOR, MD Referring: Alphonsa Glendia LABOR, MD Visit Date: 02/01/2024 Occupation: Data Unavailable  Subjective:  No chief complaint on file.   History of Present Illness: Ruth [name] is a 83 y.o. female ***     Activities of Daily Living:  Patient reports morning stiffness for *** {minute/hour:19697}.   Patient {ACTIONS;DENIES/REPORTS:21021675::Denies} nocturnal pain.  Difficulty dressing/grooming: {ACTIONS;DENIES/REPORTS:21021675::Denies} Difficulty climbing stairs: {ACTIONS;DENIES/REPORTS:21021675::Denies} Difficulty getting out of chair: {ACTIONS;DENIES/REPORTS:21021675::Denies} Difficulty using hands for taps, buttons, cutlery, and/or writing: {ACTIONS;DENIES/REPORTS:21021675::Denies}  No Rheumatology ROS completed.   PMFS History:  Patient Active Problem List   Diagnosis Date Noted   Carpal tunnel syndrome of left wrist 12/21/2023   Carpal tunnel syndrome of right wrist 12/21/2023   Osteoarthritis of first carpometacarpal joint of left hand 10/27/2023   Pain of left hand 10/22/2023   Change of skin of breast 08/06/2023   Adhesive capsulitis of right shoulder 12/16/2021   Stiffness of right shoulder joint 12/16/2021   Pain of left hip joint 10/30/2021   Full thickness rotator cuff tear 04/17/2021   Body mass index (BMI) 35.0-35.9, adult 06/06/2019   Cervical spondylosis with radiculopathy 06/06/2019   MDD (major depressive disorder), recurrent, in partial remission 03/27/2019   Aftercare following joint replacement surgery 02/21/2018   Non-allergic rhinitis 12/16/2017   Allergy  to metal 12/16/2017   Vulvodynia 08/03/2014   Anxiety disorder 07/06/2014   Fibromyalgia 04/25/2014   Prediabetes 03/18/2014   Hyperlipidemia 03/18/2014   Osteopenia 03/06/2014   GERD (gastroesophageal reflux disease) 02/02/2012   IRRITABLE  BOWEL SYNDROME 02/15/2007   Degenerative joint disease of hand 02/15/2007    Past Medical History:  Diagnosis Date   Abnormal RBC 04/2023   Anemia    Anxiety    Arthritis    Asthma    Complication of anesthesia    woke up during surgery and hard to wake up, laryngospasms   Depression    Diverticulitis 05/2023   Eczema    Fatty liver    Fibromyalgia    GERD (gastroesophageal reflux disease)    EGD/ colon 1/09   H pylori ulcer 1980-1990   s/p treatment   History of kidney stones    Hyperlipidemia    IBS (irritable bowel syndrome)    Kidney cysts    Laryngospasm    Neck pain    OSA (obstructive sleep apnea)    pt [facility] gsleep study  late part of 09/2022   Pneumonia 2013   PONV (postoperative nausea and vomiting)    Pre-diabetes    Psoriasis 2023   dx by [facility] per patient   Refusal of blood transfusions as patient is Jehovah's Witness     Family History  Problem Relation Age of Onset   Paranoid behavior Mother        depression   Colon polyps Mother    Eczema Mother    Depression Mother    Diabetes Father    Heart attack Father        51's   Lung cancer Father    Cirrhosis Father 39       etoh cirrhosis   Eczema Father    Lung cancer Sister    Depression Sister    Alzheimer's disease  Sister    Hypertension Sister    Diabetes Sister    Lung cancer Sister    Sleep apnea Sister    Depression Maternal Aunt    COPD Daughter    Drug abuse Niece    Suicidality Niece    Allergic rhinitis Neg Hx    Asthma Neg Hx    Urticaria Neg Hx    Past Surgical History:  Procedure Laterality Date   BILATERAL SALPINGOOPHORECTOMY  1990s   abdominal   BIOPSY  09/11/2020   Procedure: BIOPSY;  Surgeon: Golda Claudis PENNER, MD;  [facility];  Service: Endoscopy;;  gastric   BIOPSY  11/11/2022   Procedure: BIOPSY;  Surgeon: Eartha Angelia Sieving, MD;  [facility];  Service: Gastroenterology;;   BIOPSY  01/18/2023   Procedure: BIOPSY;   Surgeon: Wilhelmenia Aloha Raddle., MD;  Location: [facility];  Service: Gastroenterology;;   CARDIAC CATHETERIZATION  2002   [facility]) normal coronary arteries    COLONOSCOPY  01/2007   Dr. Salvador   COLONOSCOPY N/A 04/18/2015   Procedure: COLONOSCOPY;  Surgeon: Claudis PENNER Golda, MD;  [facility];  Service: Endoscopy;  Laterality: N/A;  830 - moved to 8:55 - Ann to notify pt   COLONOSCOPY WITH ESOPHAGOGASTRODUODENOSCOPY (EGD)  02/16/2012   Procedure: COLONOSCOPY WITH ESOPHAGOGASTRODUODENOSCOPY (EGD);  Surgeon: Lamar CHRISTELLA Hollingshead, MD;  [facility];  Service: Endoscopy;  Laterality: N/A;  8:45   COLONOSCOPY WITH PROPOFOL  N/A 09/11/2020   Procedure: COLONOSCOPY WITH PROPOFOL ;  Surgeon: Golda Claudis PENNER, MD;  [facility];  Service: Endoscopy;  Laterality: N/A;  10:55   ESOPHAGEAL DILATION N/A 06/07/2014   Procedure: ESOPHAGEAL DILATION;  Surgeon: Claudis PENNER Golda, MD;  [facility];  Service: Endoscopy;  Laterality: N/A;   ESOPHAGEAL DILATION N/A 09/11/2020   Procedure: ESOPHAGEAL DILATION;  Surgeon: Golda Claudis PENNER, MD;  [facility];  Service: Endoscopy;  Laterality: N/A;   ESOPHAGOGASTRODUODENOSCOPY  01/2007   Dr Jakie- 3 cm hiatal hernia, benign esophageal biopsies, erosive esophagitis, gastritis   ESOPHAGOGASTRODUODENOSCOPY N/A 06/07/2014   Procedure: ESOPHAGOGASTRODUODENOSCOPY (EGD);  Surgeon: Claudis PENNER Golda, MD;  [facility];  Service: Endoscopy;  Laterality: N/A;  1200   ESOPHAGOGASTRODUODENOSCOPY N/A 08/18/2017   Procedure: ESOPHAGOGASTRODUODENOSCOPY (EGD);  Surgeon: Golda Claudis PENNER, MD;  [facility];  Service: Endoscopy;  Laterality: N/A;  2:20   ESOPHAGOGASTRODUODENOSCOPY (EGD) WITH PROPOFOL  N/A 09/11/2020   Procedure: ESOPHAGOGASTRODUODENOSCOPY (EGD) WITH PROPOFOL ;  Surgeon: Golda Claudis PENNER, MD;  [facility];  Service: Endoscopy;  Laterality: N/A;   ESOPHAGOGASTRODUODENOSCOPY (EGD)  WITH PROPOFOL  N/A 11/11/2022   Procedure: ESOPHAGOGASTRODUODENOSCOPY (EGD) WITH PROPOFOL ;  Surgeon: Eartha Angelia Sieving, MD;  [facility];  Service: Gastroenterology;  Laterality: N/A;  7:30am;asa 3   ESOPHAGOGASTRODUODENOSCOPY (EGD) WITH PROPOFOL  N/A 01/18/2023   Procedure: ESOPHAGOGASTRODUODENOSCOPY (EGD) WITH PROPOFOL ;  Surgeon: Wilhelmenia Aloha Raddle., MD;  [facility]: [facility];  Service: Gastroenterology;  Laterality: N/A;   EUS N/A 01/18/2023   Procedure: UPPER ENDOSCOPIC ULTRASOUND (EUS) RADIAL;  Surgeon: Wilhelmenia Aloha Raddle., MD;  Location: [facility];  Service: Gastroenterology;  Laterality: N/A;   FOOT SURGERY Left 2005   hammer toe   INNER EAR SURGERY     Left   KNEE SURGERY Left    x 2   POLYPECTOMY  09/11/2020   Procedure: POLYPECTOMY;  Surgeon: Golda Claudis PENNER, MD;  [facility];  Service: Endoscopy;;   SHOULDER ARTHROSCOPY WITH ROTATOR CUFF  REPAIR Right 05/07/2021   Procedure: SHOULDER ARTHROSCOPY WITH MANIPULATION UNDER ANESTHESIA ,SUBACROMIAL DECOMPRESSION, DISTAL CLAVICLE RESECTION, ROTATOR CUFF REPAIR , BICEPS TENOTOMY;  Surgeon: Gerome Charleston, MD;  Location: [facility];  Service: Orthopedics;  Laterality: Right;  WITH INTERSCALENE BLOCK NEEDS PRE OP CONSULT  WITH ANESTHESIA 120   SHOULDER SURGERY Left 2011   TONSILLECTOMY     age 72   TOTAL KNEE ARTHROPLASTY Left 02/07/2018   Procedure: TOTAL KNEE ARTHROPLASTY;  Surgeon: Melodi Lerner, MD;  Location: [facility];  Service: Orthopedics;  Laterality: Left;    TUBAL LIGATION  1970   with an appy   TYMPANOSTOMY TUBE PLACEMENT  1984   VAGINAL HYSTERECTOMY  1970   Social History   Tobacco Use   Smoking status: Never    Passive exposure: Past   Smokeless tobacco: Never  Vaping Use   Vaping status: Never Used  Substance Use Topics   Alcohol use: Yes    Alcohol/week: 0.0 standard drinks of alcohol    Comment: per patient very seldom   Drug use: No   Social History   Social  History Narrative   Married 2 grown children, 1 adopted son-autistic-lives next door.   1 granddaughter   2 step grandsons     Immunization History  Administered Date(s) Administered   Influenza,inj,Quad PF,6+ Mos 12/15/2017, 11/15/2018   Influenza,inj,quad, With Preservative 11/16/2016   Influenza-Unspecified 01/16/2015, 11/22/2015, 11/01/2019, 11/30/2020, 11/26/2021, 11/17/2022   Moderna Sars-Covid-2 Vaccination 03/24/2019, 04/22/2019, 11/18/2019, 06/02/2020   Pneumococcal Conjugate-13 02/15/2014   Pneumococcal Polysaccharide-23 01/25/2020   Zoster Recombinant(Shingrix) 07/10/2016     Objective: Vital Signs: There were no vitals taken for this visit.   Physical Exam   Musculoskeletal Exam: ***  CDAI Exam: CDAI Score: -- Patient Global: --; Provider Global: -- Swollen: --; Tender: -- Joint Exam 02/01/2024   No joint exam has been documented for this visit   There is currently no information documented on the homunculus. Go to the Rheumatology activity and complete the homunculus joint exam.  Investigation: No additional findings.  Imaging: No results found.  Recent Labs: Lab Results  Component Value Date   WBC 3.9 08/06/2023   HGB 12.8 08/06/2023   PLT 218 08/06/2023   NA 138 01/03/2024   K 4.4 01/03/2024   CL 100 01/03/2024   CO2 24 01/03/2024   GLUCOSE 87 01/03/2024   BUN 25 01/03/2024   CREATININE 0.92 01/03/2024   BILITOT <0.2 01/03/2024   ALKPHOS 66 01/03/2024   AST 14 01/03/2024   ALT 13 01/03/2024   PROT 6.6 01/03/2024   ALBUMIN 4.5 01/03/2024   CALCIUM  9.3 01/03/2024   GFRAA 84 07/07/2018    Speciality Comments: No specialty comments available.  Procedures:  No procedures performed Allergies: Blood-group specific substance, Ciprofloxacin , Codeine, Latex, Sertraline hcl, Vicodin [hydrocodone -acetaminophen ], Statins, and Tramadol    Assessment / Plan:     Visit Diagnoses: No diagnosis found.  Orders: No orders of the defined types were  placed in this encounter.  No orders of the defined types were placed in this encounter.   Face-to-face time spent with patient was *** minutes. Greater than 50% of time was spent in counseling and coordination of care.  Follow-Up Instructions: No follow-ups on file.   Daved JAYSON Gavel, CMA  Note - This record has been created using Animal nutritionist.  Chart creation errors have been sought, but may not always  have been located. Such creation errors do not reflect on  the standard of medical care.

## 2024-01-21 ENCOUNTER — Telehealth: Payer: Self-pay

## 2024-01-21 NOTE — Progress Notes (Signed)
 Care Guide Pharmacy Note  01/21/2024 Name: Ruth [name] MRN: [id number] DOB: [birth year]  Referred By: Alphonsa Glendia LABOR, MD Reason for referral: Complex Care Management (Outreach to schedule with Pharm d )   Ruth [name] is a 83 y.o. year old female who is a primary care patient of Luking, Glendia LABOR, MD.  Ruth [name] was referred to the pharmacist for assistance related to: HLD  Successful contact was made with the patient to discuss pharmacy services including being ready for the pharmacist to call at least 5 minutes before the scheduled appointment time and to have medication bottles and any blood pressure readings ready for review. The patient agreed to meet with the pharmacist via telephone visit on (date/time).01/24/2024  Jeoffrey Buffalo , RMA     [place]  [facility], [facility] Guide  Direct Dial: [phone]  Website: [email or web address]

## 2024-01-24 ENCOUNTER — Encounter: Payer: Self-pay | Admitting: Pharmacist

## 2024-01-24 ENCOUNTER — Other Ambulatory Visit (HOSPITAL_COMMUNITY): Payer: Self-pay

## 2024-01-24 ENCOUNTER — Telehealth: Payer: Self-pay | Admitting: Pharmacy Technician

## 2024-01-24 ENCOUNTER — Other Ambulatory Visit

## 2024-01-24 ENCOUNTER — Telehealth: Payer: Self-pay | Admitting: Pharmacist

## 2024-01-24 DIAGNOSIS — T466X5A Adverse effect of antihyperlipidemic and antiarteriosclerotic drugs, initial encounter: Secondary | ICD-10-CM

## 2024-01-24 DIAGNOSIS — G720 Drug-induced myopathy: Secondary | ICD-10-CM

## 2024-01-24 DIAGNOSIS — E785 Hyperlipidemia, unspecified: Secondary | ICD-10-CM

## 2024-01-24 MED ORDER — REPATHA SURECLICK 140 MG/ML ~~LOC~~ SOAJ: 140.0000 mg | SUBCUTANEOUS | 2 refills | Status: DC

## 2024-01-24 NOTE — Telephone Encounter (Signed)
 Pharmacy Patient Advocate Encounter   Received notification from Physician's Office that prior authorization for Repatha  SureClick 140MG /ML auto-injectors is required/requested.   Insurance verification completed.   The patient is insured through [facility] ADVANTAGE/RX ADVANCE.   Per test claim: PA required; PA submitted to above mentioned insurance via Latent Key/confirmation #/EOC [id number] Status is pending

## 2024-01-24 NOTE — Telephone Encounter (Signed)
 Correction approval dates are 01/24/2024-07/22/2024.

## 2024-01-24 NOTE — Telephone Encounter (Signed)
 [employer] FOR REPATHA 

## 2024-01-24 NOTE — Progress Notes (Signed)
 "  01/24/2024 Name: Ruth [name] MRN: [id number] DOB: [birth year]  Chief Complaint  Patient presents with   Hyperlipidemia    Ruth [name] is a 83 y.o. year old female who presented for a telephone visit.   They were referred to the pharmacist by their PCP for assistance in managing hyperlipidemia/cardiovascular risk reduction and medication access.    Subjective:  Care Team: Primary Care Provider: Alphonsa Glendia LABOR, MD    Medication Access/Adherence  Current Pharmacy:  [facility] [address] [place] - [address] [place] [address] Phone: [phone] Fax: [phone]   Patient reports affordability concerns with their medications: Yes  Patient reports access/transportation concerns to their pharmacy: No  Patient reports adherence concerns with their medications:  Yes  statin myopathy   Hyperlipidemia/ASCVD Risk Reduction  Current lipid lowering medications: tried rosuvastatin  daily at 5mg  and experienced myopathy Medications tried in the past: pravastatin   Antiplatelet regimen: n/a  ASCVD History: n/a CAC score in 2023: IMPRESSION: Coronary calcium  score of 69.1. This was 42nd percentile for age-, race-, and sex-matched controls. Family History: father-MI in 81s  Current physical activity: encouraged as able  Current medication access support: will enroll in [employer] grant   Objective:  Lab Results  Component Value Date   HGBA1C 5.9 (H) 05/19/2023    Lab Results  Component Value Date   CREATININE 0.92 01/03/2024   BUN 25 01/03/2024   NA 138 01/03/2024   K 4.4 01/03/2024   CL 100 01/03/2024   CO2 24 01/03/2024    Lab Results  Component Value Date   CHOL 267 (H) 01/03/2024   HDL 61 01/03/2024   LDLCALC 178 (H) 01/03/2024   TRIG 153 (H) 01/03/2024   CHOLHDL 4.4 01/03/2024   Medications Reviewed Today     Reviewed by Billee Mliss BIRCH, RPH-CPP (Pharmacist) on 02/14/24 at 1149  Med List  Status: <None>   Medication Order Taking? Sig Documenting Provider Last Dose Status Informant  acetaminophen  (TYLENOL ) 500 MG tablet [id number]  Take 1,000 mg by mouth every 6 (six) hours as needed for moderate pain. [provider]  Active Self  acidophilus (RISAQUAD) CAPS capsule [id number]  Take 1 capsule by mouth 4 (four) times a week. [provider]  Active Self  albuterol  (VENTOLIN  HFA) 108 (90 Base) MCG/ACT inhaler [id number]  Inhale 2 puffs into the lungs every 6 (six) hours as needed for wheezing. Alphonsa Glendia LABOR, MD  Active Self  Ascorbic Acid (VITAMIN C PO) [id number]  Take 1 tablet by mouth 4 (four) times a week. [provider]  Active Self  b complex vitamins capsule [id number]  Take 1 capsule by mouth 4 (four) times a week. [provider]  Active Self  busPIRone  (BUSPAR ) 5 MG tablet [id number]  Take 1 tablet (5 mg total) by mouth 2 (two) times daily. Alphonsa Glendia LABOR, MD  Active   cephALEXin  (KEFLEX ) 500 MG capsule [id number]  Take 1 capsule (500 mg total) by mouth 3 (three) times daily. Alphonsa Glendia LABOR, MD  Active   cholecalciferol (VITAMIN D3) 25 MCG (1000 UNIT) tablet [id number]  Take 1,000 Units by mouth daily. [provider]  Active   dicyclomine  (BENTYL ) 10 MG capsule [id number]  Take 1 capsule (10 mg total) by mouth 3 (three) times daily as needed. For stomach cramps Alphonsa Glendia LABOR, MD  Active Self  Evolocumab  (REPATHA  SURECLICK) 140 MG/ML SOAJ [id number] Yes Inject 140 mg into the skin every 14 (fourteen)  days. **patient needs LATEX FREE NDC** Luking, Scott A, MD  Active   famotidine  (PEPCID ) 40 MG tablet [id number]  Take 1 tablet (40 mg total) by mouth daily. Carlan, Chelsea L, NP  Active   FLUoxetine  (PROZAC ) 40 MG capsule [id number]  Oral [provider]  Active   fluticasone  (CUTIVATE ) 0.05 % cream [id number]  Apply 1 application  topically daily as needed (irritation). [provider]  Active Self  hydrocortisone  2.5 %  cream [id number]  Apply bid prn to rash Alphonsa Glendia LABOR, MD  Active   ketoconazole  (NIZORAL ) 2 % cream [id number]  Apply 1 Application topically daily. Alphonsa Glendia LABOR, MD  Active   lidocaine -prilocaine  (EMLA ) cream [id number]  Apply 1 Application topically as needed. Marilynne Rosaline SAILOR, MD  Active Self  nitroGLYCERIN  (NITROSTAT ) 0.4 MG SL tablet [id number]  as needed. [provider]  Active Self  ondansetron  (ZOFRAN ) 4 MG tablet [id number]  Take 1 tablet (4 mg total) by mouth every 8 (eight) hours as needed for nausea or vomiting. Alphonsa Glendia LABOR, MD  Active Self  oxyCODONE  (OXY IR/ROXICODONE ) 5 MG immediate release tablet [id number]  1/2 tablet twice daily as needed Luking, Glendia LABOR, MD  Active   oxyCODONE  (ROXICODONE ) 5 MG immediate release tablet [id number]  1/2 tablet twice daily as needed pain Alphonsa Glendia LABOR, MD  Active   oxyCODONE  (ROXICODONE ) 5 MG immediate release tablet [id number]  1/2 tablet twice daily as needed pain Alphonsa Glendia LABOR, MD  Active   Polyvinyl Alcohol (LUBRICANT DROPS OP) [id number]  Place 2 drops into both eyes 3 (three) times daily as needed (for dry eyes). [provider]  Active Self  rosuvastatin  (CRESTOR ) 5 MG tablet [id number]  Take 1 tablet (5 mg total) by mouth daily. Alphonsa Glendia LABOR, MD  Active   tacrolimus (PROTOPIC) 0.1 % ointment [id number]  Apply topically 2 (two) times daily. [provider]  Active   triamcinolone  cream (KENALOG ) 0.1 % [id number]  APPLY TWICE DAILY TO BACK AS NEEDED FOR ITCHING [provider]  Active   valACYclovir  (VALTREX ) 500 MG tablet [id number]  1 bid for 5 days cold sore use prn Alphonsa Glendia LABOR, MD  Active   Zinc Sulfate (ZINC 15 PO) [id number]  Take by mouth. [provider]  Active              Assessment/Plan:   Hyperlipidemia/ASCVD Risk Reduction: - Currently uncontrolled.  - Reviewed long term complications of uncontrolled cholesterol - Reviewed dietary recommendations including  FOLLOWING A HEART HEALTHY DIET/HEALTHY PLATE METHOD - Reviewed lifestyle recommendations including increased physical activity as able - Recommend to start Repatha  Needs latex free NDC Associated with statin myopathy code  - Meets financial criteria for Repatha  patient assistance program through [employer]. Will collaborate with provider, CPhT, and patient to pursue assistance.    Follow Up Plan: 1 month with PharmD  Mliss Tarry Griffin, PharmD, BCACP, CPP Clinical Pharmacist, [facility]   "

## 2024-01-24 NOTE — Telephone Encounter (Signed)
 Pharmacy Patient Advocate Encounter  Received notification from [facility] ADVANTAGE/RX ADVANCE that Prior Authorization for Repatha  SureClick 140MG /ML auto-injectors  has been APPROVED from 01/24/2024 to 08/11/2024. Ran test claim, Copay is $0.00. This test claim was processed through [facility]- copay amounts may vary at other pharmacies due to pharmacy/plan contracts, or as the patient moves through the different stages of their insurance plan.   PA #/Case ID/Reference #: [id number]  Copay is $47.00. [employer] grant brings it down to $0.00.

## 2024-01-25 ENCOUNTER — Ambulatory Visit: Admitting: Physician Assistant

## 2024-01-25 ENCOUNTER — Encounter: Payer: Self-pay | Admitting: Physician Assistant

## 2024-01-25 VITALS — BP 117/73 | HR 82 | Temp 97.7°F | Ht 65.0 in | Wt 205.0 lb

## 2024-01-25 DIAGNOSIS — N390 Urinary tract infection, site not specified: Secondary | ICD-10-CM | POA: Insufficient documentation

## 2024-01-25 LAB — POCT URINALYSIS DIP (CLINITEK)
Glucose, UA: NEGATIVE mg/dL
Ketones, POC UA: NEGATIVE mg/dL
Nitrite, UA: NEGATIVE
POC PROTEIN,UA: NEGATIVE
Spec Grav, UA: <=1.005 — AB (ref 1.010–1.025)
Urobilinogen, UA: 0.2 U/dL
pH, UA: 6 (ref 5.0–8.0)

## 2024-01-25 MED ORDER — NITROFURANTOIN MONOHYD MACRO 100 MG PO CAPS: 100.0000 mg | ORAL_CAPSULE | Freq: Two times a day (BID) | ORAL | 0 refills | Status: AC

## 2024-01-25 NOTE — Progress Notes (Signed)
 Acute Office Visit  Subjective:     [name], female    DOB: 1940/12/28, 83 y.o.   MRN: [id number]   Discussed the use of AI scribe software for clinical note transcription with the patient, who gave verbal consent to proceed.  History of Present Illness Ruth [name] is an 83 year old female with a history of urinary tract infections who presents for a follow-up regarding persistent urinary symptoms.  She has ongoing urinary frequency and urgency that started around 11/17. Keflex  improved symptoms somewhat, but frequency and urgency persist. She reports intermittent brownish hematuria that occurs some weeks and not others. She is certain it is not from hemorrhoids or vaginal bleeding. She denies fever, nausea, vomiting, or generalized abdominal pain. She has occasional suprapubic pain and intermittent discomfort over the colon area.  She previously saw urology for kidney cyst monitoring with regular ultrasounds, which have not been repeated since before the pandemic.    Review of Systems  Constitutional:  Negative for activity change, appetite change, fatigue and fever.  Gastrointestinal:  Negative for abdominal pain, nausea and vomiting.  Genitourinary:  Positive for frequency, hematuria and urgency. Negative for difficulty urinating, dysuria, flank pain, vaginal bleeding and vaginal discharge.       Objective:     BP 117/73 (BP Location: Left Arm, Patient Position: Sitting)   Pulse 82   Temp 97.7 F (36.5 C)   Ht 5' 5 (1.651 m)   Wt 205 lb (93 kg)   SpO2 98%   BMI 34.11 kg/m   Physical Exam Constitutional:      General: She is not in acute distress.    Appearance: Normal appearance. She is obese. She is not ill-appearing.  HENT:     Head: Normocephalic and atraumatic.     Mouth/Throat:     Mouth: Mucous membranes are moist.     Pharynx: Oropharynx is clear.  Eyes:     Extraocular Movements: Extraocular movements intact.      Conjunctiva/sclera: Conjunctivae normal.  Cardiovascular:     Rate and Rhythm: Normal rate and regular rhythm.     Heart sounds: Normal heart sounds. No murmur heard. Pulmonary:     Effort: Pulmonary effort is normal.     Breath sounds: Normal breath sounds.  Abdominal:     General: Abdomen is flat.     Palpations: Abdomen is soft.     Tenderness: There is abdominal tenderness in the suprapubic area. There is no right CVA tenderness or left CVA tenderness.  Skin:    General: Skin is warm and dry.  Neurological:     General: No focal deficit present.     Mental Status: She is alert and oriented to person, place, and time.  Psychiatric:        Mood and Affect: Mood normal.        Behavior: Behavior normal.     Results for orders placed or performed in visit on 01/25/24  POCT URINALYSIS DIP (CLINITEK)  Result Value Ref Range   Color, UA yellow yellow   Clarity, UA     Glucose, UA negative negative mg/dL   Bilirubin, UA small (A) negative   Ketones, POC UA negative negative mg/dL   Spec Grav, UA <=8.994 (A) 1.010 - 1.025   Blood, UA large (A) negative   pH, UA 6.0 5.0 - 8.0   POC PROTEIN,UA negative negative, trace   Urobilinogen, UA 0.2 0.2 or 1.0 E.U./dL   Nitrite, UA  Negative Negative   Leukocytes, UA Trace (A) Negative        Assessment & Plan:  Acute UTI (urinary tract infection) Assessment & Plan: Persistent UTI with frequency, urgency, and intermittent brownish hematuria. Partial relief with Keflex . Differential includes unresolved UTI or other urological issues. Previous adverse reaction to Macrobid  noted, patient willing to try antibiotic, as this reaction was many years ago. - Prescribed Macrobid  twice daily for 5 days. - Sent urine sample for culture to identify causative bacteria and antibiotic sensitivity. - Monitor for adverse effects of Macrobid , such as double vision, and adjust treatment if necessary. - Refer to urology if symptoms persist or hematuria  continues after antibiotics.  Orders: -     POCT URINALYSIS DIP (CLINITEK) -     Urine Culture -     Nitrofurantoin  Monohyd Macro; Take 1 capsule (100 mg total) by mouth 2 (two) times daily for 5 days.  Dispense: 10 capsule; Refill: 0     No follow-ups on file.  Charmaine Dezyre Hoefer, PA-C

## 2024-01-25 NOTE — Telephone Encounter (Signed)
 Her Repatha  was sent to [facility] pharmacy please make sure patient is aware to either call her or send her MyChart message thank you

## 2024-01-25 NOTE — Assessment & Plan Note (Signed)
 Persistent UTI with frequency, urgency, and intermittent brownish hematuria. Partial relief with Keflex . Differential includes unresolved UTI or other urological issues. Previous adverse reaction to Macrobid  noted, patient willing to try antibiotic, as this reaction was many years ago. - Prescribed Macrobid  twice daily for 5 days. - Sent urine sample for culture to identify causative bacteria and antibiotic sensitivity. - Monitor for adverse effects of Macrobid , such as double vision, and adjust treatment if necessary. - Refer to urology if symptoms persist or hematuria continues after antibiotics.

## 2024-01-26 ENCOUNTER — Other Ambulatory Visit: Payer: Self-pay | Admitting: Family Medicine

## 2024-01-26 MED ORDER — CEPHALEXIN 500 MG PO CAPS: 500.0000 mg | ORAL_CAPSULE | Freq: Three times a day (TID) | ORAL | 0 refills | Status: AC

## 2024-01-26 NOTE — Telephone Encounter (Signed)
 Cephalexin  was sent into [facility] Discontinue Macrodantin  Previous visit patient had 3 prescriptions for oxycodone  was sent to [facility] recommend that she check with the pharmacy regarding her pain medicine they should have it on file if not notify us 

## 2024-01-26 NOTE — Telephone Encounter (Signed)
 Patient notified

## 2024-01-26 NOTE — Telephone Encounter (Signed)
 Patient notified. Patient verbalized understanding and is requesting a refill of Oxycodone - she also was given a script of Macrobid  yesterday for UTI but it gives her diarrhea so she wants something else

## 2024-01-29 LAB — URINE CULTURE

## 2024-02-01 ENCOUNTER — Ambulatory Visit: Admitting: Rheumatology

## 2024-02-01 DIAGNOSIS — M8589 Other specified disorders of bone density and structure, multiple sites: Secondary | ICD-10-CM

## 2024-02-01 DIAGNOSIS — M1711 Unilateral primary osteoarthritis, right knee: Secondary | ICD-10-CM

## 2024-02-01 DIAGNOSIS — M2041 Other hammer toe(s) (acquired), right foot: Secondary | ICD-10-CM

## 2024-02-01 DIAGNOSIS — R5383 Other fatigue: Secondary | ICD-10-CM

## 2024-02-01 DIAGNOSIS — Q6671 Congenital pes cavus, right foot: Secondary | ICD-10-CM

## 2024-02-01 DIAGNOSIS — M19011 Primary osteoarthritis, right shoulder: Secondary | ICD-10-CM

## 2024-02-01 DIAGNOSIS — G252 Other specified forms of tremor: Secondary | ICD-10-CM

## 2024-02-01 DIAGNOSIS — R21 Rash and other nonspecific skin eruption: Secondary | ICD-10-CM

## 2024-02-01 DIAGNOSIS — M19072 Primary osteoarthritis, left ankle and foot: Secondary | ICD-10-CM

## 2024-02-01 DIAGNOSIS — F5101 Primary insomnia: Secondary | ICD-10-CM

## 2024-02-01 DIAGNOSIS — Z96652 Presence of left artificial knee joint: Secondary | ICD-10-CM

## 2024-02-01 DIAGNOSIS — M19041 Primary osteoarthritis, right hand: Secondary | ICD-10-CM

## 2024-02-01 DIAGNOSIS — M797 Fibromyalgia: Secondary | ICD-10-CM

## 2024-02-02 ENCOUNTER — Ambulatory Visit: Payer: Self-pay | Admitting: Physician Assistant

## 2024-02-02 DIAGNOSIS — A499 Bacterial infection, unspecified: Secondary | ICD-10-CM

## 2024-02-02 MED ORDER — LEVOFLOXACIN 500 MG PO TABS: 500.0000 mg | ORAL_TABLET | Freq: Every day | ORAL | 0 refills | Status: AC

## 2024-02-03 ENCOUNTER — Ambulatory Visit: Payer: Self-pay

## 2024-02-03 NOTE — Telephone Encounter (Signed)
 Copied from CRM #[id number]. Topic: Clinical - Prescription Issue >> Feb 03, 2024 11:26 AM Donna BRAVO wrote: Reason for CRM: Patient calling asking about the allergic reaction to levofloxacin  (LEVAQUIN ) 500 MG tablet  this was prescribed for UTI. Patient would like to know if there  is something else that can be taken.   Please respond through MyChart  Phone call placed to patient-no answer. Voicemail left to call back to Nurse Triage. Will place call in callbacks.

## 2024-02-03 NOTE — Telephone Encounter (Signed)
 FYI Only or Action Required?: Action required by provider: request for alternate antibiotic.  Patient was last seen in primary care on 01/25/2024 by Grooms, [place], [place].  Called Nurse Triage reporting Medication Problem.  Symptoms began several days ago.  Interventions attempted: Nothing.  Symptoms are: unchanged.  Triage Disposition: Call PCP Now  Patient/caregiver understands and will follow disposition?: Yes     Reason for Disposition  [1] Caller has URGENT medicine question about med that primary care doctor (or NP/PA) or specialist prescribed AND [2] triager unable to answer question  Answer Assessment - Initial Assessment Questions 1. NAME of MEDICINE: What medicine(s) are you calling about?     levofloxicin 2. QUESTION: What is your question? (e.g., double dose of medicine, side effect)     Pharmacist states has ingredient she is allergic to  3. PRESCRIBER: Who prescribed the medicine? Reason: if prescribed by specialist, call should be referred to that group.     C. Grooms for UTI 4. SYMPTOMS: Do you have any symptoms? If Yes, ask: What symptoms are you having?  How bad are the symptoms (e.g., mild, moderate, severe)     UTI symptoms  Protocols used: Medication Question Call-A-AH

## 2024-02-04 ENCOUNTER — Other Ambulatory Visit: Payer: Self-pay | Admitting: Physician Assistant

## 2024-02-04 DIAGNOSIS — A499 Bacterial infection, unspecified: Secondary | ICD-10-CM

## 2024-02-04 MED ORDER — FOSFOMYCIN TROMETHAMINE 3 G PO PACK: 3.0000 g | PACK | Freq: Once | ORAL | 0 refills | Status: AC

## 2024-02-08 NOTE — Telephone Encounter (Unsigned)
 Copied from CRM #[id number]. Topic: Clinical - Medication Question >> Feb 03, 2024  1:07 PM Olam RAMAN wrote: Reason for CRM: pt was calling for nurse becky pt stated she did not take medication yet but that pharmacy flagged it that she had an allergic in the past and would like dr to prescribe her a medication levofloxacin  (LEVAQUIN ) 500 MG tablet

## 2024-02-14 ENCOUNTER — Encounter: Payer: Self-pay | Admitting: Pharmacist

## 2024-02-14 ENCOUNTER — Telehealth: Payer: Self-pay

## 2024-02-14 ENCOUNTER — Other Ambulatory Visit: Payer: Self-pay

## 2024-02-14 DIAGNOSIS — R300 Dysuria: Secondary | ICD-10-CM

## 2024-02-14 MED ORDER — REPATHA SURECLICK 140 MG/ML ~~LOC~~ SOAJ: 140.0000 mg | SUBCUTANEOUS | 2 refills | Status: AC

## 2024-02-14 NOTE — Telephone Encounter (Signed)
 Patient has dropped off another urine sample after about a week after completing antibiotics. Her current symptoms are frequency , w/ small amounts of urine. Please advise

## 2024-02-15 ENCOUNTER — Encounter: Payer: Self-pay | Admitting: Family Medicine

## 2024-02-15 ENCOUNTER — Ambulatory Visit: Payer: Self-pay | Admitting: Family Medicine

## 2024-02-15 LAB — SPECIMEN STATUS REPORT

## 2024-02-15 NOTE — Telephone Encounter (Signed)
 See my chart message

## 2024-02-15 NOTE — Telephone Encounter (Signed)
 Currently we do not have dipsticks to do a UA Urine was sent for UA and culture-please send patient a MyChart message letting her know it will take a few days to get results thank you

## 2024-02-16 LAB — SPECIMEN STATUS REPORT

## 2024-02-16 LAB — URINE CULTURE

## 2024-02-16 LAB — URINALYSIS, ROUTINE W REFLEX MICROSCOPIC

## 2024-02-17 LAB — SPECIMEN STATUS REPORT

## 2024-02-21 NOTE — Progress Notes (Unsigned)
 "  Office Visit Note  Patient: Ruth [name]             Date of Birth: 07-04-1940           MRN: [id number]             PCP: Alphonsa Glendia LABOR, MD Referring: Alphonsa Glendia LABOR, MD Visit Date: 03/01/2024 Occupation: Data Unavailable  Subjective:  No chief complaint on file.   History of Present Illness: Ruth [name] is a 84 y.o. female ***     Activities of Daily Living:  Patient reports morning stiffness for *** {minute/hour:19697}.   Patient {ACTIONS;DENIES/REPORTS:21021675::Denies} nocturnal pain.  Difficulty dressing/grooming: {ACTIONS;DENIES/REPORTS:21021675::Denies} Difficulty climbing stairs: {ACTIONS;DENIES/REPORTS:21021675::Denies} Difficulty getting out of chair: {ACTIONS;DENIES/REPORTS:21021675::Denies} Difficulty using hands for taps, buttons, cutlery, and/or writing: {ACTIONS;DENIES/REPORTS:21021675::Denies}  No Rheumatology ROS completed.   PMFS History:  Patient Active Problem List   Diagnosis Date Noted   Acute UTI (urinary tract infection) 01/25/2024   Carpal tunnel syndrome of left wrist 12/21/2023   Carpal tunnel syndrome of right wrist 12/21/2023   Osteoarthritis of first carpometacarpal joint of left hand 10/27/2023   Pain of left hand 10/22/2023   Change of skin of breast 08/06/2023   Adhesive capsulitis of right shoulder 12/16/2021   Stiffness of right shoulder joint 12/16/2021   Pain of left hip joint 10/30/2021   Full thickness rotator cuff tear 04/17/2021   Body mass index (BMI) 35.0-35.9, adult 06/06/2019   Cervical spondylosis with radiculopathy 06/06/2019   MDD (major depressive disorder), recurrent, in partial remission 03/27/2019   Aftercare following joint replacement surgery 02/21/2018   Non-allergic rhinitis 12/16/2017   Allergy  to metal 12/16/2017   Vulvodynia 08/03/2014   Anxiety disorder 07/06/2014   Fibromyalgia 04/25/2014   Prediabetes 03/18/2014   Hyperlipidemia 03/18/2014   Osteopenia 03/06/2014   GERD  (gastroesophageal reflux disease) 02/02/2012   IRRITABLE BOWEL SYNDROME 02/15/2007   Degenerative joint disease of hand 02/15/2007    Past Medical History:  Diagnosis Date   Abnormal RBC 04/2023   Anemia    Anxiety    Arthritis    Asthma    Complication of anesthesia    woke up during surgery and hard to wake up, laryngospasms   Depression    Diverticulitis 05/2023   Eczema    Fatty liver    Fibromyalgia    GERD (gastroesophageal reflux disease)    EGD/ colon 1/09   H pylori ulcer 1980-1990   s/p treatment   History of kidney stones    Hyperlipidemia    IBS (irritable bowel syndrome)    Kidney cysts    Laryngospasm    Neck pain    OSA (obstructive sleep apnea)    pt [facility] gsleep study  late part of 09/2022   Pneumonia 2013   PONV (postoperative nausea and vomiting)    Pre-diabetes    Psoriasis 2023   dx by [facility] per patient   Refusal of blood transfusions as patient is Jehovah's Witness     Family History  Problem Relation Age of Onset   Paranoid behavior Mother        depression   Colon polyps Mother    Eczema Mother    Depression Mother    Diabetes Father    Heart attack Father        3's   Lung cancer Father    Cirrhosis Father 78       etoh cirrhosis   Eczema Father    Lung cancer Sister  Depression Sister    Alzheimer's disease Sister    Hypertension Sister    Diabetes Sister    Lung cancer Sister    Sleep apnea Sister    Depression Maternal Aunt    COPD Daughter    Drug abuse Niece    Suicidality Niece    Allergic rhinitis Neg Hx    Asthma Neg Hx    Urticaria Neg Hx    Past Surgical History:  Procedure Laterality Date   BILATERAL SALPINGOOPHORECTOMY  1990s   abdominal   BIOPSY  09/11/2020   Procedure: BIOPSY;  Surgeon: Golda Claudis PENNER, MD;  [facility];  Service: Endoscopy;;  gastric   BIOPSY  11/11/2022   Procedure: BIOPSY;  Surgeon: Eartha Angelia Sieving, MD;  [facility];  Service:  Gastroenterology;;   BIOPSY  01/18/2023   Procedure: BIOPSY;  Surgeon: Wilhelmenia Aloha Raddle., MD;  Location: [facility];  Service: Gastroenterology;;   CARDIAC CATHETERIZATION  2002   [facility]) normal coronary arteries    COLONOSCOPY  01/2007   Dr. Salvador   COLONOSCOPY N/A 04/18/2015   Procedure: COLONOSCOPY;  Surgeon: Claudis PENNER Golda, MD;  [facility];  Service: Endoscopy;  Laterality: N/A;  830 - moved to 8:55 - Ann to notify pt   COLONOSCOPY WITH ESOPHAGOGASTRODUODENOSCOPY (EGD)  02/16/2012   Procedure: COLONOSCOPY WITH ESOPHAGOGASTRODUODENOSCOPY (EGD);  Surgeon: Lamar CHRISTELLA Hollingshead, MD;  [facility];  Service: Endoscopy;  Laterality: N/A;  8:45   COLONOSCOPY WITH PROPOFOL  N/A 09/11/2020   Procedure: COLONOSCOPY WITH PROPOFOL ;  Surgeon: Golda Claudis PENNER, MD;  [facility];  Service: Endoscopy;  Laterality: N/A;  10:55   ESOPHAGEAL DILATION N/A 06/07/2014   Procedure: ESOPHAGEAL DILATION;  Surgeon: Claudis PENNER Golda, MD;  [facility];  Service: Endoscopy;  Laterality: N/A;   ESOPHAGEAL DILATION N/A 09/11/2020   Procedure: ESOPHAGEAL DILATION;  Surgeon: Golda Claudis PENNER, MD;  [facility];  Service: Endoscopy;  Laterality: N/A;   ESOPHAGOGASTRODUODENOSCOPY  01/2007   Dr Jakie- 3 cm hiatal hernia, benign esophageal biopsies, erosive esophagitis, gastritis   ESOPHAGOGASTRODUODENOSCOPY N/A 06/07/2014   Procedure: ESOPHAGOGASTRODUODENOSCOPY (EGD);  Surgeon: Claudis PENNER Golda, MD;  [facility];  Service: Endoscopy;  Laterality: N/A;  1200   ESOPHAGOGASTRODUODENOSCOPY N/A 08/18/2017   Procedure: ESOPHAGOGASTRODUODENOSCOPY (EGD);  Surgeon: Golda Claudis PENNER, MD;  [facility];  Service: Endoscopy;  Laterality: N/A;  2:20   ESOPHAGOGASTRODUODENOSCOPY (EGD) WITH PROPOFOL  N/A 09/11/2020   Procedure: ESOPHAGOGASTRODUODENOSCOPY (EGD) WITH PROPOFOL ;  Surgeon: Golda Claudis PENNER, MD;  [facility];  Service:  Endoscopy;  Laterality: N/A;   ESOPHAGOGASTRODUODENOSCOPY (EGD) WITH PROPOFOL  N/A 11/11/2022   Procedure: ESOPHAGOGASTRODUODENOSCOPY (EGD) WITH PROPOFOL ;  Surgeon: Eartha Angelia Sieving, MD;  [facility];  Service: Gastroenterology;  Laterality: N/A;  7:30am;asa 3   ESOPHAGOGASTRODUODENOSCOPY (EGD) WITH PROPOFOL  N/A 01/18/2023   Procedure: ESOPHAGOGASTRODUODENOSCOPY (EGD) WITH PROPOFOL ;  Surgeon: Wilhelmenia Aloha Raddle., MD;  Location: [facility];  Service: Gastroenterology;  Laterality: N/A;   EUS N/A 01/18/2023   Procedure: UPPER ENDOSCOPIC ULTRASOUND (EUS) RADIAL;  Surgeon: Wilhelmenia Aloha Raddle., MD;  Location: [facility];  Service: Gastroenterology;  Laterality: N/A;   FOOT SURGERY Left 2005   hammer toe   INNER EAR SURGERY     Left   KNEE SURGERY Left    x 2   POLYPECTOMY  09/11/2020   Procedure: POLYPECTOMY;  Surgeon: Golda Claudis PENNER, MD;  [facility];  Service: Endoscopy;;  SHOULDER ARTHROSCOPY WITH ROTATOR CUFF REPAIR Right 05/07/2021   Procedure: SHOULDER ARTHROSCOPY WITH MANIPULATION UNDER ANESTHESIA ,SUBACROMIAL DECOMPRESSION, DISTAL CLAVICLE RESECTION, ROTATOR CUFF REPAIR , BICEPS TENOTOMY;  Surgeon: Gerome Charleston, MD;  Location: [facility];  Service: Orthopedics;  Laterality: Right;  WITH INTERSCALENE BLOCK NEEDS PRE OP CONSULT  WITH ANESTHESIA 120   SHOULDER SURGERY Left 2011   TONSILLECTOMY     age 24   TOTAL KNEE ARTHROPLASTY Left 02/07/2018   Procedure: TOTAL KNEE ARTHROPLASTY;  Surgeon: Melodi Lerner, MD;  Location: [facility];  Service: Orthopedics;  Laterality: Left;    TUBAL LIGATION  1970   with an appy   TYMPANOSTOMY TUBE PLACEMENT  1984   VAGINAL HYSTERECTOMY  1970   Social History   Tobacco Use   Smoking status: Never    Passive exposure: Past   Smokeless tobacco: Never  Vaping Use   Vaping status: Never Used  Substance Use Topics   Alcohol use: Yes    Alcohol/week: 0.0 standard drinks of alcohol    Comment: per  patient very seldom   Drug use: No   Social History   Social History Narrative   Married 2 grown children, 1 adopted son-autistic-lives next door.   1 granddaughter   2 step grandsons     Immunization History  Administered Date(s) Administered   Influenza,inj,Quad PF,6+ Mos 12/15/2017, 11/15/2018   Influenza,inj,quad, With Preservative 11/16/2016   Influenza-Unspecified 01/16/2015, 11/22/2015, 11/01/2019, 11/30/2020, 11/26/2021, 11/17/2022   Moderna Sars-Covid-2 Vaccination 03/24/2019, 04/22/2019, 11/18/2019, 06/02/2020   Pneumococcal Conjugate-13 02/15/2014   Pneumococcal Polysaccharide-23 01/25/2020   Zoster Recombinant(Shingrix) 07/10/2016     Objective: Vital Signs: There were no vitals taken for this visit.   Physical Exam   Musculoskeletal Exam: ***  CDAI Exam: CDAI Score: -- Patient Global: --; Provider Global: -- Swollen: --; Tender: -- Joint Exam 03/01/2024   No joint exam has been documented for this visit   There is currently no information documented on the homunculus. Go to the Rheumatology activity and complete the homunculus joint exam.  Investigation: No additional findings.  Imaging: No results found.  Recent Labs: Lab Results  Component Value Date   WBC 3.9 08/06/2023   HGB 12.8 08/06/2023   PLT 218 08/06/2023   NA 138 01/03/2024   K 4.4 01/03/2024   CL 100 01/03/2024   CO2 24 01/03/2024   GLUCOSE 87 01/03/2024   BUN 25 01/03/2024   CREATININE 0.92 01/03/2024   BILITOT <0.2 01/03/2024   ALKPHOS 66 01/03/2024   AST 14 01/03/2024   ALT 13 01/03/2024   PROT 6.6 01/03/2024   ALBUMIN 4.5 01/03/2024   CALCIUM  9.3 01/03/2024   GFRAA 84 07/07/2018    Speciality Comments: No specialty comments available.  Procedures:  No procedures performed Allergies: Blood-group specific substance, Ciprofloxacin , Codeine, Latex, Sertraline hcl, Vicodin [hydrocodone -acetaminophen ], Statins, and Tramadol    Assessment / Plan:     Visit Diagnoses: No  diagnosis found.  Orders: No orders of the defined types were placed in this encounter.  No orders of the defined types were placed in this encounter.   Face-to-face time spent with patient was *** minutes. Greater than 50% of time was spent in counseling and coordination of care.  Follow-Up Instructions: No follow-ups on file.   Daved JAYSON Gavel, CMA  Note - This record has been created using Animal nutritionist.  Chart creation errors have been sought, but may not always  have been located. Such creation errors do not reflect on  the standard  of medical care. "

## 2024-02-25 ENCOUNTER — Ambulatory Visit: Payer: Self-pay | Admitting: Family Medicine

## 2024-02-25 ENCOUNTER — Ambulatory Visit: Admitting: Family Medicine

## 2024-02-25 VITALS — BP 129/71 | HR 82 | Temp 97.9°F | Ht 65.0 in | Wt 203.0 lb

## 2024-02-25 DIAGNOSIS — R3989 Other symptoms and signs involving the genitourinary system: Secondary | ICD-10-CM

## 2024-02-25 DIAGNOSIS — R1013 Epigastric pain: Secondary | ICD-10-CM

## 2024-02-25 DIAGNOSIS — K3189 Other diseases of stomach and duodenum: Secondary | ICD-10-CM | POA: Diagnosis not present

## 2024-02-25 LAB — POCT URINALYSIS DIP (CLINITEK)
Bilirubin, UA: NEGATIVE
Glucose, UA: NEGATIVE mg/dL
Ketones, POC UA: NEGATIVE mg/dL
Nitrite, UA: NEGATIVE
POC PROTEIN,UA: 30 — AB
Spec Grav, UA: <=1.005 — AB
Urobilinogen, UA: 0.2 U/dL
pH, UA: 6.5

## 2024-02-25 MED ORDER — PANTOPRAZOLE SODIUM 40 MG PO TBEC: 40.0000 mg | DELAYED_RELEASE_TABLET | Freq: Every day | ORAL | 3 refills | Status: AC

## 2024-02-25 NOTE — Progress Notes (Signed)
" ° °  Subjective:    [name], female    DOB: [birth year], 83 y.o.   MRN: [id number]  HPI Patient is in room 9 To go pee at night average of 2 Patient stated this appointment is to come back regarding her bladder. Patient has several utis. Patient is still having pressure and frequent urination.  She has a Patient stated that she has a stomach nodule in the middle of her stomach. Stated it is like a burn/pressure feeling in the middle of the stomach. Happens more at night time.   Patient has a question about her repatha  medication she has been taking No acute insulin if this can wear doing well how much you which you worse to get situated go yeah yeah yeah yeah like trying so I was things at the beach like good okay so that manage doing okay it was good that we say this is Dr. Glendia Fielding over the office hey there patient relates a lot of bladder pressure Treatment stomach nodule had ultrasound about a year ago The specialist recommended a follow-up in 1 year Patient relates a lot of epigastric burning pain discomfort Patient relates pressure in the lower pelvic area Denies hematuria Patient will need follow-up with the specialist  Review of Systems     Objective:   Physical Exam General-in no acute distress Eyes-no discharge Lungs-respiratory rate normal, CTA CV-no murmurs,RRR Extremities skin warm dry no edema Neuro grossly normal Behavior normal, alert Abdomen is soft with epigastric tenderness subjective lower pelvic pressure  Urinalysis does show WBCs under the microscope as well urine culture sent       Assessment & Plan:  1. Gastric nodule (Primary) Recommend follow-up with the specialist.  We will initiate the referral  2. Epigastric pain Stop famotidine  change over to Protonix  check lab work if not improved over the next month would recommend EGD - CBC with Differential - Basic metabolic panel with GFR - Hepatic function panel - Lipase  3.  Sensation of pressure in bladder area Await culture. Consider Myrbetriq Consider urogynecology if ongoing symptoms - POCT URINALYSIS DIP (CLINITEK) - Urine Culture   "

## 2024-02-27 ENCOUNTER — Other Ambulatory Visit: Payer: Self-pay | Admitting: Family Medicine

## 2024-02-27 ENCOUNTER — Encounter: Payer: Self-pay | Admitting: Family Medicine

## 2024-02-27 MED ORDER — OXYCODONE HCL 5 MG PO TABS: ORAL_TABLET | ORAL | 0 refills | Status: AC

## 2024-02-28 ENCOUNTER — Encounter: Payer: Self-pay | Admitting: *Deleted

## 2024-02-28 NOTE — Telephone Encounter (Signed)
 Last read by Rock LILLETTE Mace at 7:50PM on 02/27/2024 via my chart

## 2024-02-29 LAB — HEPATIC FUNCTION PANEL
ALT: 15 IU/L (ref 0–32)
AST: 18 IU/L (ref 0–40)
Albumin: 4.5 g/dL (ref 3.7–4.7)
Alkaline Phosphatase: 65 IU/L (ref 48–129)
Bilirubin Total: 0.3 mg/dL (ref 0.0–1.2)
Bilirubin, Direct: 0.09 mg/dL (ref 0.00–0.40)
Total Protein: 6.8 g/dL (ref 6.0–8.5)

## 2024-02-29 LAB — CBC WITH DIFFERENTIAL/PLATELET
Basophils Absolute: 0 x10E3/uL (ref 0.0–0.2)
Basos: 1 %
EOS (ABSOLUTE): 0.2 x10E3/uL (ref 0.0–0.4)
Eos: 4 %
Hematocrit: 41.9 % (ref 34.0–46.6)
Hemoglobin: 13.5 g/dL (ref 11.1–15.9)
Immature Grans (Abs): 0 x10E3/uL (ref 0.0–0.1)
Immature Granulocytes: 0 %
Lymphocytes Absolute: 2.1 x10E3/uL (ref 0.7–3.1)
Lymphs: 35 %
MCH: 31.8 pg (ref 26.6–33.0)
MCHC: 32.2 g/dL (ref 31.5–35.7)
MCV: 99 fL — ABNORMAL HIGH (ref 79–97)
Monocytes Absolute: 0.7 x10E3/uL (ref 0.1–0.9)
Monocytes: 11 %
Neutrophils Absolute: 2.9 x10E3/uL (ref 1.4–7.0)
Neutrophils: 49 %
Platelets: 243 x10E3/uL (ref 150–450)
RBC: 4.24 x10E6/uL (ref 3.77–5.28)
RDW: 12.8 % (ref 11.7–15.4)
WBC: 5.9 x10E3/uL (ref 3.4–10.8)

## 2024-02-29 LAB — BASIC METABOLIC PANEL WITH GFR
BUN/Creatinine Ratio: 25 (ref 12–28)
BUN: 19 mg/dL (ref 8–27)
CO2: 27 mmol/L (ref 20–29)
Calcium: 9.7 mg/dL (ref 8.7–10.3)
Chloride: 103 mmol/L (ref 96–106)
Creatinine, Ser: 0.75 mg/dL (ref 0.57–1.00)
Glucose: 94 mg/dL (ref 70–99)
Potassium: 5 mmol/L (ref 3.5–5.2)
Sodium: 141 mmol/L (ref 134–144)
eGFR: 79 mL/min/1.73

## 2024-02-29 LAB — LIPASE: Lipase: 31 U/L (ref 14–85)

## 2024-03-01 ENCOUNTER — Ambulatory Visit: Payer: Self-pay | Admitting: Rheumatology

## 2024-03-01 DIAGNOSIS — M19071 Primary osteoarthritis, right ankle and foot: Secondary | ICD-10-CM

## 2024-03-01 DIAGNOSIS — M19042 Primary osteoarthritis, left hand: Secondary | ICD-10-CM

## 2024-03-01 DIAGNOSIS — M1711 Unilateral primary osteoarthritis, right knee: Secondary | ICD-10-CM

## 2024-03-01 DIAGNOSIS — G252 Other specified forms of tremor: Secondary | ICD-10-CM

## 2024-03-01 DIAGNOSIS — M797 Fibromyalgia: Secondary | ICD-10-CM

## 2024-03-01 DIAGNOSIS — Z96652 Presence of left artificial knee joint: Secondary | ICD-10-CM

## 2024-03-01 DIAGNOSIS — M19011 Primary osteoarthritis, right shoulder: Secondary | ICD-10-CM

## 2024-03-01 DIAGNOSIS — M2041 Other hammer toe(s) (acquired), right foot: Secondary | ICD-10-CM

## 2024-03-01 DIAGNOSIS — F5101 Primary insomnia: Secondary | ICD-10-CM

## 2024-03-01 DIAGNOSIS — M8589 Other specified disorders of bone density and structure, multiple sites: Secondary | ICD-10-CM

## 2024-03-01 DIAGNOSIS — R5383 Other fatigue: Secondary | ICD-10-CM

## 2024-03-01 DIAGNOSIS — R21 Rash and other nonspecific skin eruption: Secondary | ICD-10-CM

## 2024-03-01 DIAGNOSIS — Q6672 Congenital pes cavus, left foot: Secondary | ICD-10-CM

## 2024-03-02 ENCOUNTER — Other Ambulatory Visit: Payer: Self-pay | Admitting: Family Medicine

## 2024-03-02 ENCOUNTER — Other Ambulatory Visit: Payer: Self-pay

## 2024-03-02 LAB — URINE CULTURE

## 2024-03-02 MED ORDER — CIPROFLOXACIN HCL 250 MG PO TABS: 250.0000 mg | ORAL_TABLET | Freq: Two times a day (BID) | ORAL | 0 refills | Status: AC

## 2024-03-03 ENCOUNTER — Other Ambulatory Visit (HOSPITAL_COMMUNITY): Payer: Self-pay

## 2024-03-03 ENCOUNTER — Telehealth: Payer: Self-pay | Admitting: Pharmacy Technician

## 2024-03-03 NOTE — Telephone Encounter (Signed)
 Pharmacy Patient Advocate Encounter  Received notification from [employer] [facility] that Prior Authorization for oxyCODONE  HCl 5MG  tablets has been APPROVED from 02/17/2024 to 03/03/2025. Ran test claim, Copay is $0.94. This test claim was processed through [facility]- copay amounts may vary at other pharmacies due to pharmacy/plan contracts, or as the patient moves through the different stages of their insurance plan.   PA #/Case ID/Reference #: [id number]

## 2024-03-03 NOTE — Telephone Encounter (Signed)
 Pharmacy Patient Advocate Encounter   Received notification from [facility] that prior authorization for oxyCODONE  HCl 5MG  tablets is required/requested.   Insurance verification completed.   The patient is insured through [employer].   Per test claim: PA required; PA submitted to above mentioned insurance via Latent Key/confirmation #/EOC [id number] Status is pending

## 2024-03-06 ENCOUNTER — Telehealth: Payer: Self-pay

## 2024-03-06 NOTE — Telephone Encounter (Signed)
 Nurses-please call [facility] pharmacy,  Please let them know she is not allergic to Cipro -I have verified this with the patient She has side effects with Cipro  that caused her to have fatigue but this is not a allergy -patient is aware I have spoken with the patient She has a urinary tract infection that will not respond to any other antibiotic that is oral other than Cipro  Please relay this to the above information they should be able to fill the prescription If for some reason they need to speak with me let me know

## 2024-03-06 NOTE — Telephone Encounter (Signed)
 Spoke with pharmacy and they are going to fill prescription as written

## 2024-03-06 NOTE — Telephone Encounter (Signed)
 Copied from CRM [id number]. Topic: Clinical - Medication Question >> Mar 06, 2024 10:32 AM Montie POUR wrote: Reason for CRM:  Joellen from [facility] is calling about medication  ciprofloxacin  (CIPRO ) 250 MG tablet. They show she is allergic to the medication. Please call her at [phone] to discuss before they can fill medication. Thanks

## 2024-03-16 ENCOUNTER — Ambulatory Visit: Payer: Self-pay

## 2024-03-16 NOTE — Telephone Encounter (Signed)
 FYI Only or Action Required?: FYI only for provider: appointment scheduled on 03/17/24.  Patient was last seen in primary care on 02/25/2024 by Alphonsa Glendia LABOR, MD.  Called Nurse Triage reporting Hip Pain.  Symptoms began several days ago.  Interventions attempted: OTC medications: tylenol , Prescription medications: oxycodone , and Ice/heat application.  Symptoms are: gradually worsening.  Triage Disposition: See PCP When Office is Open (Within 3 Days)  Patient/caregiver understands and will follow disposition?: Yes   Message from Victoria B sent at 03/16/2024  4:21 PM EST  Reason for Triage: patient had near fainting spell, severe pain in back buttocks and is spreading to thighs     Reason for Disposition  [1] MODERATE pain (e.g., interferes with normal activities, limping) AND [2] present > 3 days  Answer Assessment - Initial Assessment Questions Pt called to report pain at waistline, specifically L hip area. Pt denies radiation of pain and states it feels more like joint pain. Pain is constant, 3/10 currently but pt reports pain has been at 9-10/10 over the past 3 days. Pt denies any injury but reports hx of fibromyalgia. Pt reports pain is worse when sitting and better with movement, she does sleep in a recliner. Pt states she took oxy 1/2 tab x 2 without relief so she is taking tylenol , using heat/ice and movement for pain management. Pt also reports one occurrence of dizziness where she felt faint and did have vision change with black floaters. This symptom has resolved but would prefer eval. Pt is agreeable to see alternative provider. Appointment scheduled for evaluation. Patient agrees with plan of care, and will call back if anything changes, or if symptoms worsen.     1. LOCATION and RADIATION: Where is the pain located? Does the pain spread (shoot) anywhere else?     L waistline at hip  2. QUALITY: What does the pain feel like?  (e.g., sharp, dull, aching, burning)      Joint pain, aching   3. SEVERITY: How bad is the pain? What does it keep you from doing?   (Scale 1-10; or mild, moderate, severe)     3/10 at this time; 9/10 at its worst   4. ONSET: When did the pain start? Does it come and go, or is it there all the time?     X 3 days ago   5. WORK OR EXERCISE: Has there been any recent work or exercise that involved this part of the body?      No   6. CAUSE: What do you think is causing the hip pain?      Pt unsure; states she does have fibromyalgia and has had hip pain in the past but not similar to this event   7. AGGRAVATING FACTORS: What makes the hip pain worse? (e.g., walking, climbing stairs, running)     Sitting   8. OTHER SYMPTOMS: Do you have any other symptoms? (e.g., back pain, pain shooting down leg,  fever, rash)     dizziness  Protocols used: Hip Pain-A-AH

## 2024-03-16 NOTE — Telephone Encounter (Signed)
 Patient has appointment with Charmaine on Friday - 03/17/2024

## 2024-03-17 ENCOUNTER — Emergency Department (HOSPITAL_COMMUNITY)
Admission: EM | Admit: 2024-03-17 | Discharge: 2024-03-17 | Disposition: A | Discharge status: Home / Self Care | Payer: Medicare (Managed Care) | Attending: Emergency Medicine | Admitting: Emergency Medicine

## 2024-03-17 ENCOUNTER — Encounter: Payer: Self-pay | Admitting: Physician Assistant

## 2024-03-17 ENCOUNTER — Other Ambulatory Visit: Payer: Self-pay

## 2024-03-17 ENCOUNTER — Encounter (HOSPITAL_COMMUNITY): Payer: Self-pay

## 2024-03-17 ENCOUNTER — Ambulatory Visit (INDEPENDENT_AMBULATORY_CARE_PROVIDER_SITE_OTHER): Payer: Medicare (Managed Care) | Admitting: Physician Assistant

## 2024-03-17 VITALS — BP 127/76 | HR 81 | Temp 99.0°F | Ht 65.0 in | Wt 200.6 lb

## 2024-03-17 DIAGNOSIS — R1024 Suprapubic pain: Secondary | ICD-10-CM | POA: Diagnosis present

## 2024-03-17 DIAGNOSIS — N3001 Acute cystitis with hematuria: Secondary | ICD-10-CM | POA: Diagnosis not present

## 2024-03-17 DIAGNOSIS — Z91040 Latex allergy status: Secondary | ICD-10-CM | POA: Diagnosis not present

## 2024-03-17 DIAGNOSIS — R350 Frequency of micturition: Secondary | ICD-10-CM | POA: Insufficient documentation

## 2024-03-17 DIAGNOSIS — M25552 Pain in left hip: Secondary | ICD-10-CM

## 2024-03-17 DIAGNOSIS — Z7951 Long term (current) use of inhaled steroids: Secondary | ICD-10-CM | POA: Insufficient documentation

## 2024-03-17 DIAGNOSIS — M533 Sacrococcygeal disorders, not elsewhere classified: Secondary | ICD-10-CM | POA: Insufficient documentation

## 2024-03-17 DIAGNOSIS — J45909 Unspecified asthma, uncomplicated: Secondary | ICD-10-CM | POA: Insufficient documentation

## 2024-03-17 LAB — CBC WITH DIFFERENTIAL/PLATELET
Abs Immature Granulocytes: 0.01 10*3/uL (ref 0.00–0.07)
Basophils Absolute: 0.1 10*3/uL (ref 0.0–0.1)
Basophils Relative: 1 %
Eosinophils Absolute: 0.2 10*3/uL (ref 0.0–0.5)
Eosinophils Relative: 4 %
HCT: 40 % (ref 36.0–46.0)
Hemoglobin: 12.9 g/dL (ref 12.0–15.0)
Immature Granulocytes: 0 %
Lymphocytes Relative: 39 %
Lymphs Abs: 2.3 10*3/uL (ref 0.7–4.0)
MCH: 31.5 pg (ref 26.0–34.0)
MCHC: 32.3 g/dL (ref 30.0–36.0)
MCV: 97.8 fL (ref 80.0–100.0)
Monocytes Absolute: 0.6 10*3/uL (ref 0.1–1.0)
Monocytes Relative: 11 %
Neutro Abs: 2.7 10*3/uL (ref 1.7–7.7)
Neutrophils Relative %: 45 %
Platelets: 222 10*3/uL (ref 150–400)
RBC: 4.09 MIL/uL (ref 3.87–5.11)
RDW: 12.9 % (ref 11.5–15.5)
WBC: 5.8 10*3/uL (ref 4.0–10.5)
nRBC: 0 % (ref 0.0–0.2)

## 2024-03-17 LAB — URINALYSIS, W/ REFLEX TO CULTURE (INFECTION SUSPECTED)
Bacteria, UA: NONE SEEN
Bilirubin Urine: NEGATIVE
Glucose, UA: NEGATIVE mg/dL
Ketones, ur: NEGATIVE mg/dL
Nitrite: NEGATIVE
Protein, ur: NEGATIVE mg/dL
Specific Gravity, Urine: 1.008 (ref 1.005–1.030)
pH: 6 (ref 5.0–8.0)

## 2024-03-17 LAB — POCT URINALYSIS DIP (CLINITEK)
Glucose, UA: NEGATIVE mg/dL
Ketones, POC UA: NEGATIVE mg/dL
Nitrite, UA: NEGATIVE
POC PROTEIN,UA: NEGATIVE
Spec Grav, UA: 1.015
Urobilinogen, UA: NEGATIVE U/dL — AB
pH, UA: 7

## 2024-03-17 LAB — BASIC METABOLIC PANEL WITH GFR
Anion gap: 14 (ref 5–15)
BUN: 16 mg/dL (ref 8–23)
CO2: 23 mmol/L (ref 22–32)
Calcium: 9.7 mg/dL (ref 8.9–10.3)
Chloride: 104 mmol/L (ref 98–111)
Creatinine, Ser: 0.6 mg/dL (ref 0.44–1.00)
GFR, Estimated: >60 mL/min
Glucose, Bld: 95 mg/dL (ref 70–99)
Potassium: 4.6 mmol/L (ref 3.5–5.1)
Sodium: 141 mmol/L (ref 135–145)

## 2024-03-17 MED ORDER — TOBRAMYCIN SULFATE 80 MG/2ML IJ SOLN
5.0000 mg/kg | Freq: Once | INTRAVENOUS | Status: DC
  Filled 2024-03-17: qty 8.75

## 2024-03-17 NOTE — Discharge Instructions (Addendum)
 As discussed, today's labs are reassuring, you do have some white blood cells in your urine but no other sign of infection.  Your urine culture is pending and should result within the next 48 hours.  If your urine culture is positive requiring antibiotics you may need to return here for IV antibiotics if necessary, or if the culture suggests tablets that can be called into a pharmacy for you.  Your other blood tests are normal

## 2024-03-17 NOTE — Assessment & Plan Note (Signed)
 Patient presents today with approximately 1 week of left hip pain. Symptoms not significantly relieved by Tylenol  or previously prescribed oxycodone . Pain is constant and affects sleep. Discussed limitation in pharmacologic options as patient cannot tolerate NSAIDs due to GI side effects. Discussed potential use of steroid taper, however patient with concerns about using steroids due to psoriasis flare history. - Ordered x-ray of left hip to assess for underlying issues. - Continue Tylenol  and oxycodone  as needed for pain management. - Follow up pending imaging or if symptoms worsen.

## 2024-03-17 NOTE — Progress Notes (Signed)
 "  Acute Office Visit  Subjective:     [name], female    DOB: 05-09-1940, 84 y.o.   MRN: [id number]   Discussed the use of AI scribe software for clinical note transcription with the patient, who gave verbal consent to proceed.  History of Present Illness Ruth [name] is an 84 year old female with recurrent urinary tract infections who presents with persistent left hip pain and potential unresolved UTI symptoms.  She has ongoing issues with urinary tract infections, experiencing pressure in her bladder but no stinging or burning sensations. Previously prescribed Cipro  on 02/25/24 for a pseudomonas infection, but she was hesitant to take it due to past adverse reactions and only took one dose, not completing the course. No fevers have been noted at home, and she has not checked her temperature regularly. Her husband does not concern for altered mental status, stating she seems to be slightly confused, is having trouble remembering certain things, and is struggling with her words.   She began experiencing left hip pain on Monday and mentioned having had problems with fibromyalgia in that hip a long time ago. The pain is severe, preventing her from sitting or standing for long periods and disrupting her sleep. She attempted to manage the pain with half doses of oxycodone  and Tylenol , finding some relief with Tylenol , but the pain persisted. The pain is constant and radiates from her left hip to her buttocks and lower back.  On Wednesday morning, she experienced a presyncopal episode, feeling as though she might pass out, which she has experienced in the past. The sensation lasted longer than usual but did not result in loss of consciousness. She has not had any similar episodes since then.  She reports nausea this morning, which she describes as intermittent. She denies vomiting and has not had any significant changes in her urinary symptoms aside from the bladder  pressure.   Review of Systems  Constitutional:  Positive for activity change and fatigue. Negative for appetite change and fever.  Respiratory:  Negative for shortness of breath.   Cardiovascular:  Negative for chest pain.  Gastrointestinal:  Positive for abdominal pain and nausea. Negative for vomiting.  Genitourinary:  Negative for difficulty urinating, dysuria, frequency and hematuria.  Musculoskeletal:  Positive for arthralgias and gait problem. Negative for joint swelling.  Psychiatric/Behavioral:  Positive for confusion. Negative for agitation and behavioral problems.        Objective:     BP 127/76   Pulse 81   Temp 99 F (37.2 C)   Ht 5' 5 (1.651 m)   Wt 200 lb 9.6 oz (91 kg)   SpO2 95%   BMI 33.38 kg/m   Physical Exam Constitutional:      General: She is not in acute distress.    Appearance: Normal appearance. She is obese.  HENT:     Head: Normocephalic and atraumatic.     Mouth/Throat:     Mouth: Mucous membranes are moist.     Pharynx: Oropharynx is clear.  Eyes:     Extraocular Movements: Extraocular movements intact.     Conjunctiva/sclera: Conjunctivae normal.  Cardiovascular:     Rate and Rhythm: Normal rate and regular rhythm.     Heart sounds: Normal heart sounds. No murmur heard. Pulmonary:     Breath sounds: Normal breath sounds.  Abdominal:     General: Abdomen is flat. Bowel sounds are normal.     Palpations: Abdomen is soft.  Tenderness: There is abdominal tenderness in the suprapubic area. There is no right CVA tenderness or left CVA tenderness.  Musculoskeletal:     Lumbar back: Tenderness present. No swelling, edema, deformity or spasms.     Left hip: Tenderness and bony tenderness present. No deformity. Normal range of motion. Normal strength.  Skin:    General: Skin is warm and dry.  Neurological:     General: No focal deficit present.     Mental Status: She is alert.     Comments: Patient is oriented, however slightly altered  from baseline without acute neuro findings         Assessment & Plan:  Left hip pain Assessment & Plan: Patient presents today with approximately 1 week of left hip pain. Symptoms not significantly relieved by Tylenol  or previously prescribed oxycodone . Pain is constant and affects sleep. Discussed limitation in pharmacologic options as patient cannot tolerate NSAIDs due to GI side effects. Discussed potential use of steroid taper, however patient with concerns about using steroids due to psoriasis flare history. - Ordered x-ray of left hip to assess for underlying issues. - Continue Tylenol  and oxycodone  as needed for pain management. - Follow up pending imaging or if symptoms worsen.   Orders: -     DG HIP UNILAT W OR W/O PELVIS 1V LEFT  Acute cystitis with hematuria Assessment & Plan: Patient initially presents today for concerns of hip pain, however per chart review and patient admitting to not finishing previous course of antibiotics, concern raised for untreated cystitis. Previous culture showed resistance to most oral antibiotics except Cipro , which is not tolerated due to reported severe side effects. Patient only took 1 tablet of previously prescribed antibiotic. Concerns about potential sepsis due to untreated UTI, especially with current symptoms of nausea, fatigue, and slightly altered mental status. Risk of sepsis is heightened by the upcoming snowstorm, limiting access to care. - Referred to ER for IV antibiotics to treat UTI and prevent sepsis. - Obtained urine sample in office which confirmed ongoing infection.   Orders: -     POCT URINALYSIS DIP (CLINITEK)   I personally spent a total of 50 minutes in the care of the patient today including preparing to see the patient, performing a medically appropriate exam/evaluation, placing orders, referring and communicating with other health care professionals, documenting clinical information in the EHR, independently interpreting  results, communicating results, and coordinating care.   Return if symptoms worsen or fail to improve.  Altovise Wahler, PA-C   "

## 2024-03-17 NOTE — Assessment & Plan Note (Signed)
 Patient initially presents today for concerns of hip pain, however per chart review and patient admitting to not finishing previous course of antibiotics, concern raised for untreated cystitis. Previous culture showed resistance to most oral antibiotics except Cipro , which is not tolerated due to reported severe side effects. Patient only took 1 tablet of previously prescribed antibiotic. Concerns about potential sepsis due to untreated UTI, especially with current symptoms of nausea, fatigue, and slightly altered mental status. Risk of sepsis is heightened by the upcoming snowstorm, limiting access to care. - Referred to ER for IV antibiotics to treat UTI and prevent sepsis. - Obtained urine sample in office which confirmed ongoing infection.

## 2024-03-17 NOTE — ED Triage Notes (Signed)
 Pt sent from PCP for recurrent UTI, states she has tried oral antibiotics without success, PCP sent for IV antibiotics.

## 2024-03-22 LAB — URINE CULTURE: Culture: 20000 — AB

## 2024-03-23 ENCOUNTER — Telehealth (HOSPITAL_BASED_OUTPATIENT_CLINIC_OR_DEPARTMENT_OTHER): Payer: Self-pay | Admitting: *Deleted

## 2024-03-23 ENCOUNTER — Emergency Department (HOSPITAL_COMMUNITY)
Admission: EM | Admit: 2024-03-23 | Discharge: 2024-03-23 | Disposition: A | Discharge status: Home / Self Care | Payer: Medicare (Managed Care) | Source: Ambulatory Visit | Attending: Emergency Medicine | Admitting: Emergency Medicine

## 2024-03-23 ENCOUNTER — Encounter (HOSPITAL_COMMUNITY): Payer: Self-pay | Admitting: Emergency Medicine

## 2024-03-23 ENCOUNTER — Other Ambulatory Visit: Payer: Self-pay

## 2024-03-23 ENCOUNTER — Ambulatory Visit: Payer: Self-pay

## 2024-03-23 DIAGNOSIS — N3000 Acute cystitis without hematuria: Secondary | ICD-10-CM

## 2024-03-23 LAB — CBC WITH DIFFERENTIAL/PLATELET
Abs Immature Granulocytes: 0 10*3/uL (ref 0.00–0.07)
Basophils Absolute: 0.1 10*3/uL (ref 0.0–0.1)
Basophils Relative: 1 %
Eosinophils Absolute: 0.2 10*3/uL (ref 0.0–0.5)
Eosinophils Relative: 4 %
HCT: 37.1 % (ref 36.0–46.0)
Hemoglobin: 12.2 g/dL (ref 12.0–15.0)
Immature Granulocytes: 0 %
Lymphocytes Relative: 40 %
Lymphs Abs: 2.5 10*3/uL (ref 0.7–4.0)
MCH: 32 pg (ref 26.0–34.0)
MCHC: 32.9 g/dL (ref 30.0–36.0)
MCV: 97.4 fL (ref 80.0–100.0)
Monocytes Absolute: 0.7 10*3/uL (ref 0.1–1.0)
Monocytes Relative: 12 %
Neutro Abs: 2.7 10*3/uL (ref 1.7–7.7)
Neutrophils Relative %: 43 %
Platelets: 216 10*3/uL (ref 150–400)
RBC: 3.81 MIL/uL — ABNORMAL LOW (ref 3.87–5.11)
RDW: 13 % (ref 11.5–15.5)
WBC: 6.2 10*3/uL (ref 4.0–10.5)
nRBC: 0 % (ref 0.0–0.2)

## 2024-03-23 LAB — URINALYSIS, ROUTINE W REFLEX MICROSCOPIC
Bacteria, UA: NONE SEEN
Bilirubin Urine: NEGATIVE
Glucose, UA: NEGATIVE mg/dL
Hgb urine dipstick: NEGATIVE
Ketones, ur: NEGATIVE mg/dL
Nitrite: NEGATIVE
Protein, ur: NEGATIVE mg/dL
Specific Gravity, Urine: 1.019 (ref 1.005–1.030)
pH: 5 (ref 5.0–8.0)

## 2024-03-23 LAB — BASIC METABOLIC PANEL WITH GFR
Anion gap: 14 (ref 5–15)
BUN: 23 mg/dL (ref 8–23)
CO2: 22 mmol/L (ref 22–32)
Calcium: 9.3 mg/dL (ref 8.9–10.3)
Chloride: 103 mmol/L (ref 98–111)
Creatinine, Ser: 0.72 mg/dL (ref 0.44–1.00)
GFR, Estimated: >60 mL/min
Glucose, Bld: 93 mg/dL (ref 70–99)
Potassium: 4.1 mmol/L (ref 3.5–5.1)
Sodium: 138 mmol/L (ref 135–145)

## 2024-03-23 MED ORDER — TOBRAMYCIN SULFATE 80 MG/2ML IJ SOLN
5.0000 mg/kg | Freq: Once | INTRAVENOUS | Status: AC
  Administered 2024-03-23: 460 mg via INTRAVENOUS
  Filled 2024-03-23: qty 11.5

## 2024-03-23 NOTE — Telephone Encounter (Addendum)
 Post ED Visit - Positive Culture Follow-up: Successful Patient Follow-Up  Culture assessed and recommendations reviewed by:  [x]  Rankin Sams, Pharm.D. []  Venetia Gully, [address].D., BCPS AQ-ID []  Garrel Crews, Pharm.D., BCPS []  Almarie Lunger, Pharm.D., BCPS []  [place], [place].D., BCPS, AAHIVP []  Rosaline Bihari, Pharm.D., BCPS, AAHIVP []  Vernell Meier, PharmD, BCPS []  Latanya Hint, PharmD, BCPS []  Donald Medley, PharmD, BCPS []  Rocky Bold, PharmD  Positive urine culture  [x]  Patient discharged without antimicrobial prescription and treatment is now indicated []  Organism is resistant to prescribed ED discharge antimicrobial []  Patient with positive blood cultures  Pt called for symptom check.  She reports lower abdominal pressure, urinary frequency, and urgency. Changes discussed with ED provider: Dr. Rocky Massy New antibiotic prescription:  Given pt's allergy  to Cipro , pt should return to ED for IV antibiotics (Tobramycin ).  Contacted patient, date 03/23/24, time 1530  Pt states she doesn't have transportation right now, and she will have her husband take her to the ER when he gets home from work.    Lorita Barnie Pereyra 03/23/2024, 3:34 PM

## 2024-03-23 NOTE — Progress Notes (Signed)
 ED Antimicrobial Stewardship Positive Culture Follow Up   Ruth [name] is an 84 y.o. female who presented to [facility] with a chief complaint of suprapubic pain   Chief Complaint  Patient presents with   UTI.    Recent Results (from the past 720 hours)  Urine Culture     Status: Abnormal   Collection Time: 02/25/24  7:27 PM   Specimen: Urine   Urine  Result Value Ref Range Status   Urine Culture, Routine Final report (A)  Corrected   Organism ID, Bacteria Comment (A)  Corrected    Comment: Pseudomonas aeruginosa 50,000-100,000 colony forming units per mL    ORGANISM ID, BACTERIA Comment  Final    Comment: Mixed urogenital flora 25,000-50,000 colony forming units per mL This is a corrected report. The previously reported result was: ========Test=============Result===========Units=======Date Resulted= Mixed urogenital flora Less than 10,000 colonies/mL    Antimicrobial Susceptibility Comment  Final    Comment:       ** S = Susceptible; I = Intermediate; R = Resistant **                    P = Positive; N = Negative             MICS are expressed in micrograms per mL    Antibiotic                 RSLT#1    RSLT#2    RSLT#3    RSLT#4 Amikacin                         =S Cefepime                         =S Ceftazidime                      =S Ceftazidime/avibactam            =S Ceftolozane/tazobactam           =S Ciprofloxacin                     =S Levofloxacin                      =I Piperacillin/Tazobactam          =S Tobramycin                        =S   Urine Culture     Status: Abnormal   Collection Time: 03/17/24  1:18 PM   Specimen: Urine, Clean Catch  Result Value Ref Range Status   Specimen Description   Final    URINE, CLEAN CATCH Performed at [facility], [address] [place], [place] [address]    Special Requests   Final    NONE Performed at [facility], [address], [place], [place] [address]    Culture 20,000 COLONIES/mL PSEUDOMONAS AERUGINOSA  (A)  Final   Report Status 03/22/2024 FINAL  Final   Organism ID, Bacteria PSEUDOMONAS AERUGINOSA (A)  Final      Susceptibility   Pseudomonas aeruginosa - MIC*    MEROPENEM <=0.25 SENSITIVE Sensitive     CIPROFLOXACIN  <=0.06 SENSITIVE Sensitive     IMIPENEM 1 SENSITIVE Sensitive     PIP/TAZO Value in next row Sensitive      <=4 SENSITIVEThis is a modified FDA-approved test  that has been validated and its performance characteristics determined by the reporting laboratory.  This laboratory is certified under the Clinical Laboratory Improvement Amendments CLIA as qualified to perform high complexity clinical laboratory testing.    CEFEPIME Value in next row Sensitive      <=4 SENSITIVEThis is a modified FDA-approved test that has been validated and its performance characteristics determined by the reporting laboratory.  This laboratory is certified under the Clinical Laboratory Improvement Amendments CLIA as qualified to perform high complexity clinical laboratory testing.    CEFTAZIDIME/AVIBACTAM Value in next row Sensitive      <=4 SENSITIVEThis is a modified FDA-approved test that has been validated and its performance characteristics determined by the reporting laboratory.  This laboratory is certified under the Clinical Laboratory Improvement Amendments CLIA as qualified to perform high complexity clinical laboratory testing.    CEFTOLOZANE/TAZOBACTAM Value in next row Sensitive      <=4 SENSITIVEThis is a modified FDA-approved test that has been validated and its performance characteristics determined by the reporting laboratory.  This laboratory is certified under the Clinical Laboratory Improvement Amendments CLIA as qualified to perform high complexity clinical laboratory testing.    TOBRAMYCIN  Value in next row Sensitive      <=4 SENSITIVEThis is a modified FDA-approved test that has been validated and its performance characteristics determined by the reporting laboratory.  This laboratory  is certified under the Clinical Laboratory Improvement Amendments CLIA as qualified to perform high complexity clinical laboratory testing.    CEFTAZIDIME Value in next row Sensitive      <=4 SENSITIVEThis is a modified FDA-approved test that has been validated and its performance characteristics determined by the reporting laboratory.  This laboratory is certified under the Clinical Laboratory Improvement Amendments CLIA as qualified to perform high complexity clinical laboratory testing.    * 20,000 COLONIES/mL PSEUDOMONAS AERUGINOSA    Patient discharged originally without antimicrobial agent and treatment is now indicated  Due to patient allergies she cannot receive ciprofloxacin . However, only IV options are available for treatment of the organism causing her UTI. Previously refused IV tobramycin  to treat this. Call patient and if still symptomatic, recommend returning to ED for IV antibiotics.   ED Provider: Rocky Massy, MD    Rankin Sams 03/23/2024, 11:53 AM Clinical Pharmacist Monday - Friday phone -  [phone] Saturday - Sunday phone - [phone]

## 2024-03-23 NOTE — ED Provider Notes (Signed)
 " [facility] AT [facility] Provider Note   CSN: [id number] Arrival date & time: 03/23/24  8279     Patient presents with: No chief complaint on file.   Ruth [name] is a 84 y.o. female.   HPI Patient presents for IV antibiotics.  Has recent been treated for UTI.  Had culture from January 30 that showed Pseudomonas although only 20,000 colonies.  Has had suprapubic pressure.  Has been treated for this previously.  Reportedly did not tolerate Cipro  or Levaquin .  Reviewing note from previous visit where she had been sent into the ER for IV antibiotics.  However patient not willing to be admitted and did not want to take the tobramycin  IV single dose that had been recommended by infectious disease.  It appears that the plan was to recheck culture and it was positive would need further treatment.  Comes back now after being called for positive culture.  Denies fever but states she still has the suprapubic pain.    Prior to Admission medications  Medication Sig Start Date End Date Taking? Authorizing Provider  ciprofloxacin  (CIPRO ) 250 MG tablet Take 1 tablet (250 mg total) by mouth 2 (two) times daily. 03/02/24   Alphonsa Glendia LABOR, MD  acetaminophen  (TYLENOL ) 500 MG tablet Take 1,000 mg by mouth every 6 (six) hours as needed for moderate pain.    [provider]  acidophilus (RISAQUAD) CAPS capsule Take 1 capsule by mouth 4 (four) times a week.    [provider]  albuterol  (VENTOLIN  HFA) 108 (90 Base) MCG/ACT inhaler Inhale 2 puffs into the lungs every 6 (six) hours as needed for wheezing. 02/03/22   Alphonsa Glendia LABOR, MD  Ascorbic Acid (VITAMIN C PO) Take 1 tablet by mouth 4 (four) times a week.    [provider]  b complex vitamins capsule Take 1 capsule by mouth 4 (four) times a week.    [provider]  busPIRone  (BUSPAR ) 5 MG tablet Take 1 tablet (5 mg total) by mouth 2 (two) times daily. 08/11/23   Alphonsa Glendia LABOR, MD   cephALEXin  (KEFLEX ) 500 MG capsule Take 1 capsule (500 mg total) by mouth 3 (three) times daily. Patient not taking: Reported on 03/17/2024 01/26/24   Alphonsa Glendia LABOR, MD  cholecalciferol (VITAMIN D3) 25 MCG (1000 UNIT) tablet Take 1,000 Units by mouth daily.    [provider]  dicyclomine  (BENTYL ) 10 MG capsule Take 1 capsule (10 mg total) by mouth 3 (three) times daily as needed. For stomach cramps 11/24/21   Alphonsa Glendia LABOR, MD  Evolocumab  (REPATHA  SURECLICK) 140 MG/ML SOAJ Inject 140 mg into the skin every 14 (fourteen) days. **patient needs LATEX FREE NDC** 02/14/24   Alphonsa Glendia LABOR, MD  famotidine  (PEPCID ) 40 MG tablet Take 1 tablet (40 mg total) by mouth daily. 04/01/23   Carlan, Chelsea L, NP  FLUoxetine  (PROZAC ) 40 MG capsule Oral 10/24/19   [provider]  fluticasone  (CUTIVATE ) 0.05 % cream Apply 1 application  topically daily as needed (irritation).    [provider]  hydrocortisone  2.5 % cream Apply bid prn to rash 09/28/23   Alphonsa Glendia LABOR, MD  ketoconazole  (NIZORAL ) 2 % cream Apply 1 Application topically daily. 08/11/23   Alphonsa Glendia LABOR, MD  lidocaine -prilocaine  (EMLA ) cream Apply 1 Application topically as needed. 03/13/22   Marilynne Rosaline SAILOR, MD  nitroGLYCERIN  (NITROSTAT ) 0.4 MG SL tablet as needed. 04/14/16   [provider]  ondansetron  (ZOFRAN ) 4  MG tablet Take 1 tablet (4 mg total) by mouth every 8 (eight) hours as needed for nausea or vomiting. 12/16/21   Alphonsa Glendia LABOR, MD  oxyCODONE  (OXY IR/ROXICODONE ) 5 MG immediate release tablet 1/2 tablet twice daily as needed 02/27/24   Alphonsa Glendia LABOR, MD  oxyCODONE  (ROXICODONE ) 5 MG immediate release tablet 1/2 tablet twice daily as needed pain 02/27/24   Alphonsa Glendia LABOR, MD  pantoprazole  (PROTONIX ) 40 MG tablet Take 1 tablet (40 mg total) by mouth daily. 02/25/24   Alphonsa Glendia LABOR, MD  Polyvinyl Alcohol (LUBRICANT DROPS OP) Place 2 drops into both eyes 3 (three) times daily as needed (for dry eyes).     [provider]  tacrolimus (PROTOPIC) 0.1 % ointment Apply topically 2 (two) times daily. 12/22/23   [provider]  triamcinolone  cream (KENALOG ) 0.1 % APPLY TWICE DAILY TO BACK AS NEEDED FOR ITCHING    [provider]  valACYclovir  (VALTREX ) 500 MG tablet 1 bid for 5 days cold sore use prn 09/28/23   Alphonsa Glendia LABOR, MD  Zinc Sulfate (ZINC 15 PO) Take by mouth.    [provider]    Allergies: Blood-group specific substance, Ciprofloxacin , Codeine, Latex, Sertraline hcl, Vicodin [hydrocodone -acetaminophen ], Statins, and Tramadol     Review of Systems  Updated Vital Signs BP (!) 128/52   Pulse 80   Temp 97.9 F (36.6 C) (Oral)   Resp 19   Ht 5' 5 (1.651 m)   Wt 91 kg   SpO2 100%   BMI 33.38 kg/m   Physical Exam Vitals and nursing note reviewed.  Cardiovascular:     Rate and Rhythm: Regular rhythm.  Chest:     Chest wall: No tenderness.  Abdominal:     Tenderness: There is no abdominal tenderness.  Neurological:     Mental Status: She is alert.     (all labs ordered are listed, but only abnormal results are displayed) Labs Reviewed  CBC WITH DIFFERENTIAL/PLATELET - Abnormal; Notable for the following components:      Result Value   RBC 3.81 (*)    All other components within normal limits  URINALYSIS, ROUTINE W REFLEX MICROSCOPIC - Abnormal; Notable for the following components:   Color, Urine AMBER (*)    APPearance HAZY (*)    Leukocytes,Ua MODERATE (*)    All other components within normal limits  BASIC METABOLIC PANEL WITH GFR    EKG: None  Radiology: No results found.   Procedures   Medications Ordered in the ED  tobramycin  (NEBCIN ) 460 mg in dextrose  5 % 50 mL IVPB (460 mg Intravenous New Bag/Given 03/23/24 2210)                                    Medical Decision Making Amount and/or Complexity of Data Reviewed Labs: ordered.   Patient presents with urinary symptoms.  Positive culture although only  20,000 CFU's for Pseudomonas.  Reviewed previous note.  Had plan for IV tobramycin .  However patient was not willing yet at that time.  Now probably would get it but is wary of side effects.  All the other oral antibiotics it sounds if she cannot tolerate.  Will recheck blood work and urinalysis  Blood work reassuring.  Good kidney function.  Does have leukocyte esterase on the urine.  With continued symptoms and recent positive culture we will treat.  Going off previous discussion with infectious disease  will give single dose of tobramycin .  After that should be able to discharge home.  Tobramycin  has infused.  Discharge home.      Final diagnoses:  Acute cystitis without hematuria    ED Discharge Orders     None          Patsey Lot, MD 03/23/24 2248  "

## 2024-03-23 NOTE — ED Notes (Addendum)
 Contacted AC about obtaining pt medication (Tobramycin  IVPB)  from main pharmacy.

## 2024-03-23 NOTE — Telephone Encounter (Signed)
 FYI Only or Action Required?: FYI only for provider: appointment scheduled on 2/9.  Patient was last seen in primary care on 03/17/2024 by [place], [place], [place].  Called Nurse Triage reporting Toe Pain.  Symptoms began a week ago.  Interventions attempted: Nothing.  Symptoms are: gradually worsening.  Triage Disposition: See Physician Within 24 Hours  Patient/caregiver understands and will follow disposition?: No, refuses disposition    Spoke with pts husband Lamar (on [place]). Pain in left big toe x1 week. Moderate pain. Grey freckle 1/8th inch wide. Left inside corner of toenail is red. No fever. No recent injury. Advised to be seen in next 24 hours. No appts with PCP until Monday. Declines seeing another provider tomorrow. Scheduled acute visit/ED f/u visit with PCP on 2/9 to f/u on recent ED visit 1/30 for suprapubic pain and toe pain. Advised UC or ED for worsening symptoms.     Message from Darshell M sent at 03/23/2024  2:52 PM EST  Summary: Worsening pain, redness   Reason for Triage: Husband reporting, patient complaining of pain in left big toe for one week. Worsening, pain, and redness with grey spot in center.         Reason for Disposition  [1] Swollen toe AND [2] no fever  (Exceptions: Just a localized bump from bunion, corns, insect bite, sting.)  Answer Assessment - Initial Assessment Questions 1. ONSET: When did the pain start?      1 week ago  2. LOCATION: Where is the pain located?   (e.g., around nail, entire toe, at foot joint)      Left great toe  3. PAIN: How bad is the pain?    (Scale 1-10; or mild, moderate, severe)     Moderate  4. APPEARANCE: What does the toe look like? (e.g., redness, swelling, bruising, pallor)     Redness around the left corner of the toenail  5. CAUSE: What do you think is causing the toe pain?     Unsure  6. OTHER SYMPTOMS: Do you have any other symptoms? (e.g., leg pain, rash, fever, numbness)      No  Protocols used: Toe Pain-A-AH

## 2024-03-23 NOTE — ED Triage Notes (Signed)
 Pt to ER states she was called and told to come back to the ER for IV abx.  Was seen here on Friday for recurrent UTI.

## 2024-03-27 ENCOUNTER — Inpatient Hospital Stay: Payer: Medicare (Managed Care) | Admitting: Family Medicine

## 2024-04-06 ENCOUNTER — Ambulatory Visit: Admitting: Family Medicine

## 2024-04-14 ENCOUNTER — Ambulatory Visit: Payer: HMO
